# Patient Record
Sex: Male | Born: 1965 | Race: White | Hispanic: No | Marital: Married | Smoking: Never smoker
Health system: Southern US, Community
[De-identification: ages and names within clinical notes are randomized; demographics above are authoritative.]

## PROBLEM LIST (undated history)

## (undated) DIAGNOSIS — E118 Type 2 diabetes mellitus with unspecified complications: Secondary | ICD-10-CM

## (undated) DIAGNOSIS — R011 Cardiac murmur, unspecified: Secondary | ICD-10-CM

## (undated) DIAGNOSIS — E7800 Pure hypercholesterolemia, unspecified: Secondary | ICD-10-CM

## (undated) DIAGNOSIS — Z5189 Encounter for other specified aftercare: Secondary | ICD-10-CM

## (undated) DIAGNOSIS — I2089 Other forms of angina pectoris: Secondary | ICD-10-CM

## (undated) DIAGNOSIS — M545 Low back pain, unspecified: Secondary | ICD-10-CM

## (undated) DIAGNOSIS — K219 Gastro-esophageal reflux disease without esophagitis: Secondary | ICD-10-CM

## (undated) DIAGNOSIS — R569 Unspecified convulsions: Secondary | ICD-10-CM

## (undated) DIAGNOSIS — I428 Other cardiomyopathies: Secondary | ICD-10-CM

## (undated) DIAGNOSIS — I480 Paroxysmal atrial fibrillation: Secondary | ICD-10-CM

## (undated) DIAGNOSIS — F32A Depression, unspecified: Secondary | ICD-10-CM

## (undated) DIAGNOSIS — Z87448 Personal history of other diseases of urinary system: Secondary | ICD-10-CM

## (undated) DIAGNOSIS — IMO0002 Reserved for concepts with insufficient information to code with codable children: Secondary | ICD-10-CM

## (undated) DIAGNOSIS — E039 Hypothyroidism, unspecified: Secondary | ICD-10-CM

## (undated) DIAGNOSIS — Z789 Other specified health status: Secondary | ICD-10-CM

## (undated) DIAGNOSIS — E538 Deficiency of other specified B group vitamins: Secondary | ICD-10-CM

## (undated) DIAGNOSIS — G629 Polyneuropathy, unspecified: Secondary | ICD-10-CM

## (undated) DIAGNOSIS — I208 Other forms of angina pectoris: Secondary | ICD-10-CM

## (undated) DIAGNOSIS — I10 Essential (primary) hypertension: Secondary | ICD-10-CM

## (undated) DIAGNOSIS — G720 Drug-induced myopathy: Secondary | ICD-10-CM

## (undated) DIAGNOSIS — G8929 Other chronic pain: Secondary | ICD-10-CM

## (undated) DIAGNOSIS — G4733 Obstructive sleep apnea (adult) (pediatric): Secondary | ICD-10-CM

## (undated) HISTORY — DX: Cardiac murmur, unspecified: R01.1

## (undated) HISTORY — PX: URETHRAL STRICTURE DILATATION: SHX477

## (undated) HISTORY — DX: Low back pain: M54.5

## (undated) HISTORY — PX: TRANSTHORACIC ECHOCARDIOGRAM: SHX275

## (undated) HISTORY — DX: Deficiency of other specified B group vitamins: E53.8

## (undated) HISTORY — DX: Obstructive sleep apnea (adult) (pediatric): G47.33

## (undated) HISTORY — DX: Other forms of angina pectoris: I20.89

## (undated) HISTORY — DX: Personal history of other diseases of urinary system: Z87.448

## (undated) HISTORY — DX: Paroxysmal atrial fibrillation: I48.0

## (undated) HISTORY — DX: Reserved for concepts with insufficient information to code with codable children: IMO0002

## (undated) HISTORY — DX: Unspecified convulsions: R56.9

## (undated) HISTORY — DX: Polyneuropathy, unspecified: G62.9

## (undated) HISTORY — PX: LUMBAR SPINE SURGERY: SHX701

## (undated) HISTORY — DX: Gastro-esophageal reflux disease without esophagitis: K21.9

## (undated) HISTORY — DX: Other specified health status: Z78.9

## (undated) HISTORY — DX: Low back pain, unspecified: M54.50

## (undated) HISTORY — DX: Type 2 diabetes mellitus with unspecified complications: E11.8

## (undated) HISTORY — DX: Other cardiomyopathies: I42.8

## (undated) HISTORY — DX: Other chronic pain: G89.29

## (undated) HISTORY — DX: Other forms of angina pectoris: I20.8

## (undated) HISTORY — DX: Depression, unspecified: F32.A

## (undated) HISTORY — DX: Hypothyroidism, unspecified: E03.9

## (undated) HISTORY — DX: Encounter for other specified aftercare: Z51.89

## (undated) HISTORY — DX: Essential (primary) hypertension: I10

## (undated) HISTORY — DX: Drug-induced myopathy: G72.0

## (undated) HISTORY — DX: Pure hypercholesterolemia, unspecified: E78.00

---

## 1997-07-25 ENCOUNTER — Ambulatory Visit (HOSPITAL_BASED_OUTPATIENT_CLINIC_OR_DEPARTMENT_OTHER): Admission: RE | Admit: 1997-07-25 | Discharge: 1997-07-25 | Payer: Self-pay | Admitting: *Deleted

## 1999-03-02 ENCOUNTER — Encounter: Payer: Self-pay | Admitting: Family Medicine

## 1999-03-02 ENCOUNTER — Ambulatory Visit (HOSPITAL_COMMUNITY): Admission: RE | Admit: 1999-03-02 | Discharge: 1999-03-02 | Payer: Self-pay | Admitting: Family Medicine

## 1999-03-06 ENCOUNTER — Encounter: Payer: Self-pay | Admitting: Family Medicine

## 1999-03-06 ENCOUNTER — Encounter: Admission: RE | Admit: 1999-03-06 | Discharge: 1999-03-06 | Payer: Self-pay | Admitting: Family Medicine

## 1999-03-16 ENCOUNTER — Encounter: Admission: RE | Admit: 1999-03-16 | Discharge: 1999-04-15 | Payer: Self-pay | Admitting: Neurological Surgery

## 1999-05-01 ENCOUNTER — Encounter: Payer: Self-pay | Admitting: Neurological Surgery

## 1999-05-01 ENCOUNTER — Ambulatory Visit (HOSPITAL_COMMUNITY): Admission: RE | Admit: 1999-05-01 | Discharge: 1999-05-01 | Payer: Self-pay | Admitting: Neurological Surgery

## 1999-05-14 ENCOUNTER — Encounter: Payer: Self-pay | Admitting: Neurological Surgery

## 1999-05-18 ENCOUNTER — Ambulatory Visit (HOSPITAL_COMMUNITY): Admission: RE | Admit: 1999-05-18 | Discharge: 1999-05-19 | Payer: Self-pay | Admitting: Neurological Surgery

## 1999-05-18 ENCOUNTER — Encounter: Payer: Self-pay | Admitting: Neurological Surgery

## 1999-07-26 ENCOUNTER — Encounter: Payer: Self-pay | Admitting: Neurological Surgery

## 1999-07-26 ENCOUNTER — Ambulatory Visit (HOSPITAL_COMMUNITY): Admission: RE | Admit: 1999-07-26 | Discharge: 1999-07-26 | Payer: Self-pay | Admitting: Neurological Surgery

## 1999-09-25 ENCOUNTER — Encounter: Payer: Self-pay | Admitting: Neurological Surgery

## 1999-09-29 ENCOUNTER — Inpatient Hospital Stay (HOSPITAL_COMMUNITY): Admission: RE | Admit: 1999-09-29 | Discharge: 1999-10-05 | Payer: Self-pay | Admitting: Neurological Surgery

## 1999-09-29 ENCOUNTER — Encounter: Payer: Self-pay | Admitting: Neurological Surgery

## 1999-10-07 ENCOUNTER — Emergency Department (HOSPITAL_COMMUNITY): Admission: EM | Admit: 1999-10-07 | Discharge: 1999-10-07 | Payer: Self-pay | Admitting: *Deleted

## 1999-11-09 ENCOUNTER — Emergency Department (HOSPITAL_COMMUNITY): Admission: EM | Admit: 1999-11-09 | Discharge: 1999-11-09 | Payer: Self-pay

## 1999-11-09 ENCOUNTER — Encounter: Payer: Self-pay | Admitting: Emergency Medicine

## 1999-11-12 ENCOUNTER — Ambulatory Visit (HOSPITAL_COMMUNITY): Admission: RE | Admit: 1999-11-12 | Discharge: 1999-11-12 | Payer: Self-pay | Admitting: Family Medicine

## 1999-11-12 ENCOUNTER — Encounter: Payer: Self-pay | Admitting: Family Medicine

## 1999-12-18 ENCOUNTER — Encounter: Payer: Self-pay | Admitting: Neurological Surgery

## 1999-12-18 ENCOUNTER — Encounter: Admission: RE | Admit: 1999-12-18 | Discharge: 1999-12-18 | Payer: Self-pay | Admitting: Neurological Surgery

## 2000-03-07 ENCOUNTER — Encounter: Admission: RE | Admit: 2000-03-07 | Discharge: 2000-05-17 | Payer: Self-pay | Admitting: Neurological Surgery

## 2000-06-01 ENCOUNTER — Ambulatory Visit (HOSPITAL_COMMUNITY): Admission: RE | Admit: 2000-06-01 | Discharge: 2000-06-01 | Payer: Self-pay | Admitting: Neurological Surgery

## 2000-06-01 ENCOUNTER — Encounter: Payer: Self-pay | Admitting: Neurological Surgery

## 2000-10-11 ENCOUNTER — Encounter: Payer: Self-pay | Admitting: Neurological Surgery

## 2000-10-11 ENCOUNTER — Inpatient Hospital Stay (HOSPITAL_COMMUNITY): Admission: RE | Admit: 2000-10-11 | Discharge: 2000-10-15 | Payer: Self-pay | Admitting: Neurological Surgery

## 2000-11-25 ENCOUNTER — Encounter: Admission: RE | Admit: 2000-11-25 | Discharge: 2000-11-25 | Payer: Self-pay | Admitting: Neurological Surgery

## 2000-11-25 ENCOUNTER — Encounter: Payer: Self-pay | Admitting: Neurological Surgery

## 2000-12-30 ENCOUNTER — Encounter: Admission: RE | Admit: 2000-12-30 | Discharge: 2000-12-30 | Payer: Self-pay | Admitting: Neurological Surgery

## 2000-12-30 ENCOUNTER — Encounter: Payer: Self-pay | Admitting: Neurological Surgery

## 2001-08-11 ENCOUNTER — Observation Stay (HOSPITAL_COMMUNITY): Admission: EM | Admit: 2001-08-11 | Discharge: 2001-08-12 | Payer: Self-pay | Admitting: Emergency Medicine

## 2001-08-11 ENCOUNTER — Encounter: Payer: Self-pay | Admitting: Emergency Medicine

## 2001-08-11 ENCOUNTER — Encounter: Payer: Self-pay | Admitting: Internal Medicine

## 2001-11-17 ENCOUNTER — Encounter: Payer: Self-pay | Admitting: Neurological Surgery

## 2001-11-17 ENCOUNTER — Ambulatory Visit (HOSPITAL_COMMUNITY): Admission: RE | Admit: 2001-11-17 | Discharge: 2001-11-17 | Payer: Self-pay | Admitting: Neurological Surgery

## 2002-02-27 ENCOUNTER — Encounter: Payer: Self-pay | Admitting: *Deleted

## 2002-02-27 ENCOUNTER — Ambulatory Visit (HOSPITAL_COMMUNITY): Admission: RE | Admit: 2002-02-27 | Discharge: 2002-02-27 | Payer: Self-pay | Admitting: *Deleted

## 2002-04-29 ENCOUNTER — Inpatient Hospital Stay (HOSPITAL_COMMUNITY): Admission: EM | Admit: 2002-04-29 | Discharge: 2002-05-02 | Payer: Self-pay | Admitting: Emergency Medicine

## 2002-04-29 ENCOUNTER — Encounter: Payer: Self-pay | Admitting: Emergency Medicine

## 2002-04-30 ENCOUNTER — Encounter: Payer: Self-pay | Admitting: Internal Medicine

## 2002-05-16 ENCOUNTER — Ambulatory Visit (HOSPITAL_COMMUNITY): Admission: RE | Admit: 2002-05-16 | Discharge: 2002-05-16 | Payer: Self-pay | Admitting: Family Medicine

## 2002-05-16 ENCOUNTER — Encounter: Payer: Self-pay | Admitting: Family Medicine

## 2002-05-23 ENCOUNTER — Ambulatory Visit (HOSPITAL_BASED_OUTPATIENT_CLINIC_OR_DEPARTMENT_OTHER): Admission: RE | Admit: 2002-05-23 | Discharge: 2002-05-23 | Payer: Self-pay | Admitting: *Deleted

## 2003-03-12 ENCOUNTER — Ambulatory Visit (HOSPITAL_COMMUNITY): Admission: RE | Admit: 2003-03-12 | Discharge: 2003-03-12 | Payer: Self-pay | Admitting: Urology

## 2003-03-12 ENCOUNTER — Encounter (INDEPENDENT_AMBULATORY_CARE_PROVIDER_SITE_OTHER): Payer: Self-pay

## 2004-12-23 ENCOUNTER — Inpatient Hospital Stay (HOSPITAL_COMMUNITY): Admission: EM | Admit: 2004-12-23 | Discharge: 2004-12-25 | Payer: Self-pay | Admitting: Emergency Medicine

## 2004-12-23 ENCOUNTER — Encounter: Payer: Self-pay | Admitting: Cardiology

## 2004-12-23 ENCOUNTER — Ambulatory Visit: Payer: Self-pay | Admitting: Cardiology

## 2004-12-24 ENCOUNTER — Ambulatory Visit: Payer: Self-pay | Admitting: Hospitalist

## 2004-12-30 ENCOUNTER — Ambulatory Visit: Payer: Self-pay | Admitting: Internal Medicine

## 2005-02-12 ENCOUNTER — Ambulatory Visit: Payer: Self-pay | Admitting: Internal Medicine

## 2005-03-11 ENCOUNTER — Ambulatory Visit (HOSPITAL_COMMUNITY): Admission: RE | Admit: 2005-03-11 | Discharge: 2005-03-11 | Payer: Self-pay | Admitting: Internal Medicine

## 2005-03-16 ENCOUNTER — Ambulatory Visit: Payer: Self-pay | Admitting: Internal Medicine

## 2005-03-16 ENCOUNTER — Ambulatory Visit (HOSPITAL_BASED_OUTPATIENT_CLINIC_OR_DEPARTMENT_OTHER): Admission: RE | Admit: 2005-03-16 | Discharge: 2005-03-16 | Payer: Self-pay | Admitting: Internal Medicine

## 2005-03-21 ENCOUNTER — Ambulatory Visit: Payer: Self-pay | Admitting: Internal Medicine

## 2005-03-31 ENCOUNTER — Ambulatory Visit: Payer: Self-pay | Admitting: Internal Medicine

## 2005-05-14 ENCOUNTER — Ambulatory Visit: Payer: Self-pay | Admitting: Internal Medicine

## 2005-05-28 ENCOUNTER — Ambulatory Visit: Payer: Self-pay | Admitting: Internal Medicine

## 2005-08-09 ENCOUNTER — Ambulatory Visit: Payer: Self-pay | Admitting: Internal Medicine

## 2005-08-09 ENCOUNTER — Ambulatory Visit (HOSPITAL_COMMUNITY): Admission: RE | Admit: 2005-08-09 | Discharge: 2005-08-09 | Payer: Self-pay | Admitting: Internal Medicine

## 2005-08-16 ENCOUNTER — Ambulatory Visit: Payer: Self-pay | Admitting: Internal Medicine

## 2005-08-27 ENCOUNTER — Ambulatory Visit: Payer: Self-pay | Admitting: Internal Medicine

## 2006-01-07 ENCOUNTER — Ambulatory Visit: Payer: Self-pay | Admitting: Internal Medicine

## 2006-01-07 ENCOUNTER — Observation Stay (HOSPITAL_COMMUNITY): Admission: AD | Admit: 2006-01-07 | Discharge: 2006-01-08 | Payer: Self-pay | Admitting: Internal Medicine

## 2006-01-07 ENCOUNTER — Ambulatory Visit: Payer: Self-pay | Admitting: Cardiology

## 2006-01-07 ENCOUNTER — Encounter: Payer: Self-pay | Admitting: Cardiology

## 2006-02-10 ENCOUNTER — Ambulatory Visit: Payer: Self-pay | Admitting: Internal Medicine

## 2006-03-17 ENCOUNTER — Ambulatory Visit: Payer: Self-pay | Admitting: Cardiovascular Disease

## 2006-03-22 ENCOUNTER — Ambulatory Visit: Payer: Self-pay

## 2006-03-25 ENCOUNTER — Ambulatory Visit: Payer: Self-pay

## 2006-04-19 ENCOUNTER — Ambulatory Visit: Payer: Self-pay | Admitting: Cardiovascular Disease

## 2006-05-07 ENCOUNTER — Inpatient Hospital Stay (HOSPITAL_COMMUNITY): Admission: EM | Admit: 2006-05-07 | Discharge: 2006-05-17 | Payer: Self-pay | Admitting: *Deleted

## 2006-05-07 ENCOUNTER — Ambulatory Visit: Payer: Self-pay | Admitting: Pulmonary Disease

## 2006-05-07 ENCOUNTER — Ambulatory Visit: Payer: Self-pay | Admitting: Internal Medicine

## 2006-05-17 DIAGNOSIS — D518 Other vitamin B12 deficiency anemias: Secondary | ICD-10-CM

## 2006-05-18 ENCOUNTER — Telehealth: Payer: Self-pay | Admitting: *Deleted

## 2006-05-19 ENCOUNTER — Telehealth: Payer: Self-pay | Admitting: *Deleted

## 2006-05-23 ENCOUNTER — Telehealth: Payer: Self-pay | Admitting: *Deleted

## 2006-05-27 ENCOUNTER — Ambulatory Visit: Payer: Self-pay | Admitting: Internal Medicine

## 2006-05-27 ENCOUNTER — Telehealth (INDEPENDENT_AMBULATORY_CARE_PROVIDER_SITE_OTHER): Payer: Self-pay | Admitting: Internal Medicine

## 2006-06-03 ENCOUNTER — Ambulatory Visit: Payer: Self-pay | Admitting: Internal Medicine

## 2006-06-03 ENCOUNTER — Encounter (INDEPENDENT_AMBULATORY_CARE_PROVIDER_SITE_OTHER): Payer: Self-pay | Admitting: Internal Medicine

## 2006-06-14 ENCOUNTER — Encounter (INDEPENDENT_AMBULATORY_CARE_PROVIDER_SITE_OTHER): Payer: Self-pay | Admitting: Internal Medicine

## 2006-06-14 ENCOUNTER — Ambulatory Visit: Payer: Self-pay | Admitting: Internal Medicine

## 2006-06-14 DIAGNOSIS — I209 Angina pectoris, unspecified: Secondary | ICD-10-CM

## 2006-06-14 DIAGNOSIS — E119 Type 2 diabetes mellitus without complications: Secondary | ICD-10-CM

## 2006-06-14 DIAGNOSIS — K219 Gastro-esophageal reflux disease without esophagitis: Secondary | ICD-10-CM | POA: Insufficient documentation

## 2006-06-14 DIAGNOSIS — G609 Hereditary and idiopathic neuropathy, unspecified: Secondary | ICD-10-CM | POA: Insufficient documentation

## 2006-06-14 DIAGNOSIS — J4489 Other specified chronic obstructive pulmonary disease: Secondary | ICD-10-CM | POA: Insufficient documentation

## 2006-06-14 DIAGNOSIS — I10 Essential (primary) hypertension: Secondary | ICD-10-CM | POA: Insufficient documentation

## 2006-06-14 DIAGNOSIS — F17200 Nicotine dependence, unspecified, uncomplicated: Secondary | ICD-10-CM

## 2006-06-14 DIAGNOSIS — J449 Chronic obstructive pulmonary disease, unspecified: Secondary | ICD-10-CM | POA: Insufficient documentation

## 2006-06-14 DIAGNOSIS — M545 Low back pain: Secondary | ICD-10-CM

## 2006-06-14 LAB — CONVERTED CEMR LAB
BUN: 15 mg/dL (ref 6–23)
CO2: 23 meq/L (ref 19–32)
Calcium: 9.3 mg/dL (ref 8.4–10.5)
Chloride: 99 meq/L (ref 96–112)
Glucose, Bld: 142 mg/dL — ABNORMAL HIGH (ref 70–99)
Hgb A1c MFr Bld: 6.8 %
Potassium: 4.6 meq/L (ref 3.5–5.3)
Sodium: 136 meq/L (ref 135–145)

## 2006-06-29 ENCOUNTER — Telehealth: Payer: Self-pay | Admitting: *Deleted

## 2006-08-31 ENCOUNTER — Encounter (INDEPENDENT_AMBULATORY_CARE_PROVIDER_SITE_OTHER): Payer: Self-pay | Admitting: Internal Medicine

## 2006-09-02 ENCOUNTER — Telehealth: Payer: Self-pay | Admitting: *Deleted

## 2006-09-07 ENCOUNTER — Telehealth: Payer: Self-pay | Admitting: *Deleted

## 2006-09-14 ENCOUNTER — Ambulatory Visit: Payer: Self-pay | Admitting: Internal Medicine

## 2006-09-14 DIAGNOSIS — G4733 Obstructive sleep apnea (adult) (pediatric): Secondary | ICD-10-CM | POA: Insufficient documentation

## 2006-09-15 ENCOUNTER — Encounter (INDEPENDENT_AMBULATORY_CARE_PROVIDER_SITE_OTHER): Payer: Self-pay | Admitting: Internal Medicine

## 2006-09-15 ENCOUNTER — Ambulatory Visit: Payer: Self-pay | Admitting: Internal Medicine

## 2006-09-15 ENCOUNTER — Ambulatory Visit: Payer: Self-pay | Admitting: Cardiovascular Disease

## 2006-09-15 LAB — CONVERTED CEMR LAB
BUN: 15 mg/dL (ref 6–23)
CO2: 27 meq/L (ref 19–32)
Calcium: 9 mg/dL (ref 8.4–10.5)
Creatinine, Ser: 1.03 mg/dL (ref 0.40–1.50)
Glucose, Bld: 184 mg/dL — ABNORMAL HIGH (ref 70–99)
Potassium: 4.2 meq/L (ref 3.5–5.3)

## 2006-10-27 ENCOUNTER — Ambulatory Visit: Payer: Self-pay | Admitting: *Deleted

## 2006-10-27 DIAGNOSIS — R609 Edema, unspecified: Secondary | ICD-10-CM

## 2006-10-27 DIAGNOSIS — R1310 Dysphagia, unspecified: Secondary | ICD-10-CM | POA: Insufficient documentation

## 2006-10-28 ENCOUNTER — Encounter (INDEPENDENT_AMBULATORY_CARE_PROVIDER_SITE_OTHER): Payer: Self-pay | Admitting: *Deleted

## 2006-10-28 LAB — CONVERTED CEMR LAB
BUN: 16 mg/dL (ref 6–23)
CO2: 28 meq/L (ref 19–32)
Creatinine, Ser: 1.09 mg/dL (ref 0.40–1.50)
Glucose, Bld: 130 mg/dL — ABNORMAL HIGH (ref 70–99)
Microalb, Ur: 2.41 mg/dL — ABNORMAL HIGH (ref 0.00–1.89)
Sodium: 142 meq/L (ref 135–145)
TSH: 2.525 microintl units/mL (ref 0.350–5.50)

## 2006-10-31 ENCOUNTER — Encounter (INDEPENDENT_AMBULATORY_CARE_PROVIDER_SITE_OTHER): Payer: Self-pay | Admitting: Internal Medicine

## 2006-11-02 ENCOUNTER — Ambulatory Visit (HOSPITAL_COMMUNITY): Admission: RE | Admit: 2006-11-02 | Discharge: 2006-11-02 | Payer: Self-pay | Admitting: *Deleted

## 2006-11-04 ENCOUNTER — Encounter (INDEPENDENT_AMBULATORY_CARE_PROVIDER_SITE_OTHER): Payer: Self-pay | Admitting: Internal Medicine

## 2006-11-29 ENCOUNTER — Encounter (INDEPENDENT_AMBULATORY_CARE_PROVIDER_SITE_OTHER): Payer: Self-pay | Admitting: Internal Medicine

## 2006-11-29 ENCOUNTER — Ambulatory Visit: Payer: Self-pay | Admitting: Internal Medicine

## 2006-11-29 LAB — CONVERTED CEMR LAB
Blood Glucose, Fingerstick: 157
Glucose, Urine, Semiquant: NEGATIVE
Ketones, ur: NEGATIVE mg/dL
Ketones, urine, test strip: NEGATIVE
Leukocytes, UA: NEGATIVE
Nitrite: NEGATIVE
Protein, ur: NEGATIVE mg/dL
Urobilinogen, UA: 0.2
pH: 5.5 (ref 5.0–8.0)

## 2006-12-28 ENCOUNTER — Encounter (INDEPENDENT_AMBULATORY_CARE_PROVIDER_SITE_OTHER): Payer: Self-pay | Admitting: Internal Medicine

## 2007-01-03 ENCOUNTER — Ambulatory Visit: Payer: Self-pay | Admitting: Hospitalist

## 2007-01-03 ENCOUNTER — Encounter (INDEPENDENT_AMBULATORY_CARE_PROVIDER_SITE_OTHER): Payer: Self-pay | Admitting: Internal Medicine

## 2007-01-03 DIAGNOSIS — L538 Other specified erythematous conditions: Secondary | ICD-10-CM | POA: Insufficient documentation

## 2007-01-03 DIAGNOSIS — R5383 Other fatigue: Secondary | ICD-10-CM

## 2007-01-03 DIAGNOSIS — R5381 Other malaise: Secondary | ICD-10-CM | POA: Insufficient documentation

## 2007-01-03 LAB — CONVERTED CEMR LAB: Hgb A1c MFr Bld: 7.3 %

## 2007-01-05 LAB — CONVERTED CEMR LAB
ALT: 36 units/L (ref 0–53)
AST: 26 units/L (ref 0–37)
Alkaline Phosphatase: 63 units/L (ref 39–117)
Chloride: 100 meq/L (ref 96–112)
Creatinine, Ser: 0.91 mg/dL (ref 0.40–1.50)
Eosinophils Absolute: 0.4 10*3/uL (ref 0.0–0.7)
Eosinophils Relative: 3 % (ref 0–5)
Hemoglobin: 14.3 g/dL (ref 13.0–17.0)
Lymphocytes Relative: 26 % (ref 12–46)
MCV: 86.3 fL (ref 78.0–100.0)
Monocytes Absolute: 1.2 10*3/uL — ABNORMAL HIGH (ref 0.2–0.7)
Monocytes Relative: 10 % (ref 3–11)
Neutro Abs: 7.4 10*3/uL (ref 1.7–7.7)
Neutrophils Relative %: 61 % (ref 43–77)
Platelets: 358 10*3/uL (ref 150–400)
RBC: 5.26 M/uL (ref 4.22–5.81)
RDW: 15.3 % — ABNORMAL HIGH (ref 11.5–14.0)
Sodium: 139 meq/L (ref 135–145)
Total Bilirubin: 0.6 mg/dL (ref 0.3–1.2)
Vitamin B-12: >2000 pg/mL — ABNORMAL HIGH (ref 211–911)

## 2007-04-10 ENCOUNTER — Telehealth (INDEPENDENT_AMBULATORY_CARE_PROVIDER_SITE_OTHER): Payer: Self-pay | Admitting: Internal Medicine

## 2007-05-12 ENCOUNTER — Telehealth (INDEPENDENT_AMBULATORY_CARE_PROVIDER_SITE_OTHER): Payer: Self-pay | Admitting: Internal Medicine

## 2007-05-19 ENCOUNTER — Encounter (INDEPENDENT_AMBULATORY_CARE_PROVIDER_SITE_OTHER): Payer: Self-pay | Admitting: Internal Medicine

## 2007-05-30 ENCOUNTER — Telehealth: Payer: Self-pay | Admitting: *Deleted

## 2007-06-06 ENCOUNTER — Telehealth (INDEPENDENT_AMBULATORY_CARE_PROVIDER_SITE_OTHER): Payer: Self-pay | Admitting: Internal Medicine

## 2007-06-08 ENCOUNTER — Telehealth (INDEPENDENT_AMBULATORY_CARE_PROVIDER_SITE_OTHER): Payer: Self-pay | Admitting: Internal Medicine

## 2007-06-30 ENCOUNTER — Encounter (INDEPENDENT_AMBULATORY_CARE_PROVIDER_SITE_OTHER): Payer: Self-pay | Admitting: Internal Medicine

## 2007-06-30 ENCOUNTER — Ambulatory Visit: Payer: Self-pay | Admitting: Hospitalist

## 2007-07-04 DIAGNOSIS — E559 Vitamin D deficiency, unspecified: Secondary | ICD-10-CM | POA: Insufficient documentation

## 2007-07-04 LAB — CONVERTED CEMR LAB
ALT: 31 units/L (ref 0–53)
AST: 24 units/L (ref 0–37)
Alkaline Phosphatase: 72 units/L (ref 39–117)
Basophils Absolute: 0 10*3/uL (ref 0.0–0.1)
Basophils Relative: 0 % (ref 0–1)
Calcium: 9.3 mg/dL (ref 8.4–10.5)
Eosinophils Absolute: 0.2 10*3/uL (ref 0.0–0.7)
Glucose, Bld: 315 mg/dL — ABNORMAL HIGH (ref 70–99)
MCV: 88.3 fL (ref 78.0–100.0)
Neutro Abs: 7.1 10*3/uL (ref 1.7–7.7)
Neutrophils Relative %: 64 % (ref 43–77)
Platelets: 398 10*3/uL (ref 150–400)
RBC: 5.37 M/uL (ref 4.22–5.81)
RDW: 13.3 % (ref 11.5–15.5)
Sodium: 136 meq/L (ref 135–145)
Total Bilirubin: 0.3 mg/dL (ref 0.3–1.2)
Vit D, 1,25-Dihydroxy: 14 — ABNORMAL LOW (ref 30–89)

## 2007-10-31 ENCOUNTER — Telehealth (INDEPENDENT_AMBULATORY_CARE_PROVIDER_SITE_OTHER): Payer: Self-pay | Admitting: Internal Medicine

## 2008-02-08 ENCOUNTER — Encounter (INDEPENDENT_AMBULATORY_CARE_PROVIDER_SITE_OTHER): Payer: Self-pay | Admitting: Internal Medicine

## 2008-02-08 ENCOUNTER — Ambulatory Visit: Payer: Self-pay | Admitting: Infectious Diseases

## 2008-02-09 DIAGNOSIS — D72829 Elevated white blood cell count, unspecified: Secondary | ICD-10-CM | POA: Insufficient documentation

## 2008-02-09 LAB — CONVERTED CEMR LAB
ALT: 27 units/L (ref 0–53)
Albumin: 4.2 g/dL (ref 3.5–5.2)
Basophils Absolute: 0 10*3/uL (ref 0.0–0.1)
Basophils Relative: 0 % (ref 0–1)
Cholesterol: 163 mg/dL (ref 0–200)
Creatinine, Ser: 1.04 mg/dL (ref 0.40–1.50)
Eosinophils Absolute: 0.3 10*3/uL (ref 0.0–0.7)
Glucose, Bld: 157 mg/dL — ABNORMAL HIGH (ref 70–99)
MCHC: 31.7 g/dL (ref 30.0–36.0)
MCV: 84 fL (ref 78.0–100.0)
Microalb Creat Ratio: 18.5 mg/g (ref 0.0–30.0)
Microalb, Ur: 2.3 mg/dL — ABNORMAL HIGH (ref 0.00–1.89)
Neutrophils Relative %: 67 % (ref 43–77)
Platelets: 363 10*3/uL (ref 150–400)
Potassium: 4.6 meq/L (ref 3.5–5.3)
RBC: 5.3 M/uL (ref 4.22–5.81)
RDW: 14.1 % (ref 11.5–15.5)
Total Protein: 8.1 g/dL (ref 6.0–8.3)
Triglycerides: 151 mg/dL — ABNORMAL HIGH (ref <150)
WBC: 15.1 10*3/uL — ABNORMAL HIGH (ref 4.0–10.5)

## 2008-09-27 ENCOUNTER — Ambulatory Visit: Payer: Self-pay | Admitting: Internal Medicine

## 2008-09-29 ENCOUNTER — Encounter: Payer: Self-pay | Admitting: Internal Medicine

## 2008-09-29 ENCOUNTER — Telehealth: Payer: Self-pay | Admitting: Internal Medicine

## 2008-09-29 DIAGNOSIS — E875 Hyperkalemia: Secondary | ICD-10-CM | POA: Insufficient documentation

## 2008-10-11 ENCOUNTER — Ambulatory Visit: Payer: Self-pay | Admitting: Internal Medicine

## 2008-10-12 ENCOUNTER — Telehealth: Payer: Self-pay | Admitting: Internal Medicine

## 2008-11-04 ENCOUNTER — Encounter: Payer: Self-pay | Admitting: Internal Medicine

## 2008-11-04 LAB — HM DIABETES EYE EXAM: HM Diabetic Eye Exam: NORMAL

## 2008-11-28 LAB — CONVERTED CEMR LAB
Alkaline Phosphatase: 52 units/L (ref 39–117)
BUN: 15 mg/dL (ref 6–23)
CO2: 26 meq/L (ref 19–32)
Chloride: 101 meq/L (ref 96–112)
Cholesterol: 141 mg/dL (ref 0–200)
Creatinine, Ser: 0.89 mg/dL (ref 0.40–1.50)
Creatinine, Ser: 0.95 mg/dL (ref 0.40–1.50)
Glucose, Bld: 134 mg/dL — ABNORMAL HIGH (ref 70–99)
Glucose, Bld: 149 mg/dL — ABNORMAL HIGH (ref 70–99)
HDL: 37 mg/dL — ABNORMAL LOW (ref >39)
Hgb A1c MFr Bld: 6.4 % — ABNORMAL HIGH (ref 4.6–6.1)
Indirect Bilirubin: 0.2 mg/dL (ref 0.0–0.9)
LDL Cholesterol: 90 mg/dL (ref 0–99)
Microalb, Ur: 2.55 mg/dL — ABNORMAL HIGH (ref 0.00–1.89)
Potassium: 4.1 meq/L (ref 3.5–5.3)
Potassium: 5.4 meq/L — ABNORMAL HIGH (ref 3.5–5.3)
TSH: 0.738 microintl units/mL (ref 0.350–4.500)
Total Bilirubin: 0.3 mg/dL (ref 0.3–1.2)
Total CHOL/HDL Ratio: 3.8
Total Protein: 7.8 g/dL (ref 6.0–8.3)
Triglycerides: 68 mg/dL (ref <150)
VLDL: 14 mg/dL (ref 0–40)

## 2008-12-26 ENCOUNTER — Ambulatory Visit: Payer: Self-pay | Admitting: Internal Medicine

## 2008-12-26 LAB — CONVERTED CEMR LAB
BUN: 15 mg/dL (ref 6–23)
CO2: 30 meq/L (ref 19–32)
Chloride: 102 meq/L (ref 96–112)
Creatinine, Ser: 0.91 mg/dL (ref 0.40–1.50)
Glucose, Bld: 90 mg/dL (ref 70–99)
Potassium: 4.7 meq/L (ref 3.5–5.3)

## 2009-01-02 ENCOUNTER — Ambulatory Visit: Payer: Self-pay | Admitting: Internal Medicine

## 2009-01-07 ENCOUNTER — Telehealth: Payer: Self-pay | Admitting: Internal Medicine

## 2009-02-06 ENCOUNTER — Encounter: Payer: Self-pay | Admitting: Internal Medicine

## 2009-02-19 ENCOUNTER — Encounter: Payer: Self-pay | Admitting: Internal Medicine

## 2009-03-03 ENCOUNTER — Encounter: Payer: Self-pay | Admitting: Internal Medicine

## 2009-03-21 ENCOUNTER — Telehealth: Payer: Self-pay | Admitting: Internal Medicine

## 2009-05-15 ENCOUNTER — Telehealth: Payer: Self-pay | Admitting: Internal Medicine

## 2009-05-29 ENCOUNTER — Ambulatory Visit: Payer: Self-pay | Admitting: Internal Medicine

## 2009-05-29 LAB — CONVERTED CEMR LAB
Chloride: 104 meq/L (ref 96–112)
Potassium: 5 meq/L (ref 3.5–5.3)

## 2009-06-26 ENCOUNTER — Ambulatory Visit: Payer: Self-pay | Admitting: Internal Medicine

## 2009-06-26 DIAGNOSIS — F329 Major depressive disorder, single episode, unspecified: Secondary | ICD-10-CM | POA: Insufficient documentation

## 2009-06-26 LAB — HM DIABETES FOOT EXAM

## 2009-07-01 ENCOUNTER — Telehealth (INDEPENDENT_AMBULATORY_CARE_PROVIDER_SITE_OTHER): Payer: Self-pay | Admitting: *Deleted

## 2009-08-25 ENCOUNTER — Encounter: Payer: Self-pay | Admitting: Internal Medicine

## 2009-11-05 ENCOUNTER — Encounter: Payer: Self-pay | Admitting: Internal Medicine

## 2009-11-05 ENCOUNTER — Ambulatory Visit: Payer: Self-pay | Admitting: Family

## 2009-11-05 LAB — CONVERTED CEMR LAB: Folate: >20 ng/mL

## 2009-11-13 ENCOUNTER — Telehealth: Payer: Self-pay | Admitting: Family

## 2009-12-05 ENCOUNTER — Ambulatory Visit: Payer: Self-pay | Admitting: Diagnostic Radiology

## 2009-12-05 ENCOUNTER — Ambulatory Visit: Payer: Self-pay | Admitting: Family

## 2009-12-05 ENCOUNTER — Ambulatory Visit (HOSPITAL_BASED_OUTPATIENT_CLINIC_OR_DEPARTMENT_OTHER): Admission: RE | Admit: 2009-12-05 | Discharge: 2009-12-05 | Payer: Self-pay | Admitting: Internal Medicine

## 2009-12-05 DIAGNOSIS — J329 Chronic sinusitis, unspecified: Secondary | ICD-10-CM | POA: Insufficient documentation

## 2009-12-05 DIAGNOSIS — M25539 Pain in unspecified wrist: Secondary | ICD-10-CM

## 2009-12-05 DIAGNOSIS — M25529 Pain in unspecified elbow: Secondary | ICD-10-CM | POA: Insufficient documentation

## 2009-12-05 LAB — CONVERTED CEMR LAB: Uric Acid, Serum: 6 mg/dL (ref 4.0–7.8)

## 2009-12-19 ENCOUNTER — Encounter: Payer: Self-pay | Admitting: Internal Medicine

## 2009-12-22 ENCOUNTER — Telehealth: Payer: Self-pay | Admitting: Internal Medicine

## 2009-12-24 ENCOUNTER — Encounter: Payer: Self-pay | Admitting: Internal Medicine

## 2009-12-31 ENCOUNTER — Encounter: Payer: Self-pay | Admitting: Internal Medicine

## 2010-01-14 ENCOUNTER — Telehealth: Payer: Self-pay | Admitting: Family

## 2010-01-28 ENCOUNTER — Ambulatory Visit: Payer: Self-pay | Admitting: Internal Medicine

## 2010-01-28 ENCOUNTER — Telehealth: Payer: Self-pay | Admitting: Internal Medicine

## 2010-01-28 DIAGNOSIS — J309 Allergic rhinitis, unspecified: Secondary | ICD-10-CM | POA: Insufficient documentation

## 2010-01-28 LAB — CONVERTED CEMR LAB
Calcium: 9.3 mg/dL (ref 8.4–10.5)
Creatinine, Ser: 0.83 mg/dL (ref 0.40–1.50)
Hgb A1c MFr Bld: 6.7 % — ABNORMAL HIGH (ref <5.7)
Sodium: 139 meq/L (ref 135–145)

## 2010-01-29 ENCOUNTER — Encounter: Payer: Self-pay | Admitting: Internal Medicine

## 2010-02-03 ENCOUNTER — Encounter: Payer: Self-pay | Admitting: Internal Medicine

## 2010-03-04 ENCOUNTER — Telehealth: Payer: Self-pay | Admitting: Internal Medicine

## 2010-03-12 ENCOUNTER — Encounter: Payer: Self-pay | Admitting: Internal Medicine

## 2010-05-05 NOTE — Progress Notes (Signed)
Summary: [facility] order form  Phone Note Other Incoming   Caller: Misty @ [facility]  [phone] Summary of Call: Received call from [facility] wanting to know if we received their diabetic supply form?  Reference # [id number].  Nicki Guadalajara Fergerson CMA [name])  March 04, 2010 10:18 AM   Follow-up for Phone Call        call returned to [facility] at [phone] regarding forms, spoke with CSR Lurena Joiner, she was informed Diabetic form has not been received. Lurena Joiner stated a form would be faxed with the next 30 minutes. She inquired about the turnaround time, she was informed 24-48 hours.  Follow-up by: Glendell Docker CMA,  March 04, 2010 1:35 PM  Additional Follow-up for Phone Call Additional follow up Details #1::        unaware if form has been recieved, no return call from [facility] or patient regarding status Additional Follow-up by: Glendell Docker CMA,  March 06, 2010 11:42 AM

## 2010-05-05 NOTE — Assessment & Plan Note (Signed)
Summary: bp high /mhf--Rm 5   Vital Signs:  Patient profile:   45 year old male Height:      73 inches Weight:      328 pounds BMI:     43.43 Temp:     98.4 degrees F oral Pulse rate:   84 / minute Pulse rhythm:   regular Resp:     18 per minute BP sitting:   150 / 86  (right arm) Cuff size:   large  Vitals Entered By: Mervin Kung CMA [name]) (November 05, 2009 1:52 PM) CC: Room 5   Pt past due for follow up of diabetes and HTN. Needs refills on: cyanocobalamin, Bystolic, Metformin and Glipizide. Is Patient Diabetic? Yes Comments Pt states he can't afford the spiriva and advair and doesn't use it.  States that he had increased his Bystolic to 1 two times a day because 1 once daily wasn't  controlling his BP. Was checking BP at drug store. Nicki Guadalajara Fergerson CMA [name])  November 05, 2009 2:00 PM    Primary Care Provider:  [name]  CC:  Room 5   Pt past due for follow up of diabetes and HTN. Needs refills on: cyanocobalamin, Bystolic, and Metformin and Glipizide.Marland Kitchen  History of Present Illness: Dylan Owen is a 45 year old male who presents today for follow up.  Notes that he has run out of his BP medications x 3 weeks.  Notes that he has continued his glipizide/metformin. Patient is a very poor historian.  1)BP- has not had his BP meds x 3 weeks.  2)COPD- now on HS 02.  Reports that he was also diagnosed with OSA but could not tolerate CPAP. Notes that he cannot afford his Advair or his spiriva.  3)DM2- Notes that he has been checking his CBG's in the AM.  He has been taking glipizide 1 tab in the AM and full tab at night.  Found that when he cut the PM dose in half, he had sugars over 200.  Notes that the lowest reading he has seen is 76.  Denies symptomatic hypoglycemia.    Allergies: 1)  Prednisone  Physical Exam  General:  Well-developed,well-nourished,in no acute distress; alert,appropriate and cooperative throughout examination Head:  Normocephalic and atraumatic  without obvious abnormalities. No apparent alopecia or balding. Lungs:  Normal respiratory effort, chest expands symmetrically. Lungs are clear to auscultation, no crackles or wheezes. Heart:  Normal rate and regular rhythm. S1 and S2 normal without gallop, murmur, click, rub or other extra sounds. Extremities:  1+ left pedal edema and 1+ right pedal edema.   Psych:  Very talkative, but pleasant.   Impression & Recommendations:  Problem # 1:  DIABETES MELLITUS, TYPE II (ICD-250.00) Assessment Improved No significant hypoglycemia, per pt sugars rose over 200 when he cut back HS dose of glipizide.  Will plan to continue current dose of metformin and 10mg  glipizide two times a day. Will check A1C  His updated medication list for this problem includes:    Metformin Hcl 1000 Mg Tabs (Metformin hcl) .Marland Kitchen... Take 1 tablet by mouth two times a day    Glipizide 10 Mg Tabs (Glipizide) ..... One by mouth in am and 1/2 to one tab in pm  Orders: T-Hgb A1C (04540-98119)  Problem # 2:  HYPERTENSION (ICD-401.9) Assessment: Deteriorated Ran out of bystolic, bp is up today.  1 month supply of samples provided to patient.  Patient instructed to resume.  F/u in 1 month  with Dr. Artist Pais. His updated medication list for this problem includes:    Bystolic 5 Mg Tabs (Nebivolol hcl) ..... One by mouth once daily  Problem # 3:  ANEMIA, VITAMIN B12 DEFICIENCY NEC (ICD-281.1) Assessment: Comment Only He has been getting rx from "old physician at [facility]".  Wife has been injecting and she tells me that his last dose was 2 weeks ago.  He is requesting refill.  Will have patient f/u in 2 weeks for nurse visit for injection.  Will check B12 level and will defer to Dr. Artist Pais if he wishes to have patient resume home injections vs. nurse visits going forward.  His updated medication list for this problem includes:    Cyanocobalamin 1000 Mcg/ml Soln (Cyanocobalamin) ..... Inject im once per month  Orders: T-Vitamin B12  ([id number]) T-Folate (95621)  Problem # 4:  COPD (ICD-496) Assessment: Unchanged Pt was given 1 month supply of spiriva samples and 2 week supply of advair discus samples.  It is not clear to me who is managing his OSA studies.  Will discuss with Dr. Artist Pais.   His updated medication list for this problem includes:    Spiriva Handihaler 18 Mcg Caps (Tiotropium bromide monohydrate) ..... Inhale 1 capsule every morning    Albuterol 90 Mcg/act Aers (Albuterol) ..... Inhale 2 puffs every 6 hours as needed for shortness of breath    Advair Diskus 250-50 Mcg/dose Misc (Fluticasone-salmeterol) ..... Inhale 1 puff two times a day  Complete Medication List: 1)  Cyanocobalamin 1000 Mcg/ml Soln (Cyanocobalamin) .... Inject im once per month 2)  Multivitamins Tabs (Multiple vitamin) .... Take 1 tablet by mouth once a day 3)  Spiriva Handihaler 18 Mcg Caps (Tiotropium bromide monohydrate) .... Inhale 1 capsule every morning 4)  Valium 5 Mg Tabs (Diazepam) .... Take 1 tablet by mouth every 6 hours as needed for anxiety and muscle spasms 5)  Zantac 150 Mg Tabs (Ranitidine hcl) .... Take 1 tablet by mouth once a day 6)  Albuterol 90 Mcg/act Aers (Albuterol) .... Inhale 2 puffs every 6 hours as needed for shortness of breath 7)  Nitroglycerin 0.4 Mg Subl (Nitroglycerin) .... Take 1 tablet under your tongue every 5 minutes, up to three times, as needed for chest pain 8)  Bystolic 5 Mg Tabs (Nebivolol hcl) .... One by mouth once daily 9)  Ms Contin 30 Mg Xr12h-tab (Morphine sulfate) .... Take one tab by mouth once every 8 hours 10)  Advair Diskus 250-50 Mcg/dose Misc (Fluticasone-salmeterol) .... Inhale 1 puff two times a day 11)  Onetouch Ultra Test Strp (Glucose blood) .... Please check your sugar before breakfast, before you go to sleep, and if you develop symptoms of lethargy or confusion. 12)  Metformin Hcl 1000 Mg Tabs (Metformin hcl) .... Take 1 tablet by mouth two times a day 13)  Glipizide 10 Mg Tabs  (Glipizide) .... One by mouth in am and 1/2 to one tab in pm 14)  Cymbalta 60 Mg Cpep (Duloxetine hcl) .... Once daily 15)  Dialyvite Vitamin D 5000 5000 Unit Caps (Cholecalciferol) .... Take 1 capsule by mouth once a day  Patient Instructions: 1)  Resume Bystolic. 2)  Complete labs downstairs. 3)  Prescriptions have been sent to your pharmacy. 4)  Follow up in 1 month with Dr. Artist Pais to follow up on your blood pressure.  Prescriptions: GLIPIZIDE 10 MG TABS (GLIPIZIDE) one by mouth in AM and 1/2 to one tab in PM  #60 x 2   Entered and Authorized  by:   Lemont Fillers FNP   Signed by:   Lemont Fillers FNP on 11/05/2009   Method used:   [facility]  [place] [address]       [address]       [place], [place]  [address]       Ph: [phone] or [phone]       Fax: [phone]   RxID:   [id number] METFORMIN HCL 1000 MG TABS (METFORMIN HCL) Take 1 tablet by mouth two times a day  #60 Tablet x 2   Entered and Authorized by:   Lemont Fillers FNP   Signed by:   Lemont Fillers FNP on 11/05/2009   Method used:   [facility]        [facility]  [place] [address]ail[facility]       [address]       [place], [place]  [address]       Ph: [phone] or [phone]       Fax: [phone]   RxID:   [id number] ADVAIR DISKUS 250-50 MCG/DOSE MISC (FLUTICASONE-SALMETEROL) Inhale 1 puff two times a day  #1 x 0   Entered and Authorized by:   Lemont Fillers FNP   Signed by:   Lemont Fillers FNP on 11/05/2009   Method used:   [facility]        [facility]  [place] [address] [facility]       [address]       [place], [place]  [address]       Ph: [phone] or [phone]       Fax: [phone]   RxID:   [id number] BYSTOLIC 5 MG TABS (NEBIVOLOL HCL) one by mouth once daily  #30 Tablet x 0   Entered and Authorized by:   Lemont Fillers FNP   Signed by:   Lemont Fillers FNP on 11/05/2009   Method used:   [facility]  [facility]        [facility]  [place] [address] [facility]       [address]       [place], [place]  [address]       Ph: [phone] or [phone]       Fax: [phone]   RxID:   [id number] SPIRIVA HANDIHALER 18 MCG CAPS (TIOTROPIUM BROMIDE MONOHYDRATE) Inhale 1 capsule every morning  #30 Capsule x 0   Entered and Authorized by:   Lemont Fillers FNP   Signed by:   Lemont Fillers FNP on 11/05/2009   Method used:   [facility]        [facility]  [place] [address]       [address]       [place], [place]  [address]       Ph: [phone] or [phone]       Fax: [phone]   RxID:   [id number]   Current Allergies (reviewed today): PREDNISONE

## 2010-05-05 NOTE — Progress Notes (Signed)
Summary: Bystolic refill & lab work question  Phone Note Call from Patient   Caller: Spouse Reason for Call: Talk to Nurse Summary of Call: Dylan [facility] say [facility], said they only received a denial from [place], [facility] [place] pt is out pt also is not sure if he needs to come in for blood work one week prior to next OV, pls advise Initial call taken by: Lannette Donath,  January 14, 2010 10:55 AM  Follow-up for Phone Call        call returned to patient at [phone],patients wife Dylan Owen answered. She was informed rx refill sent to pharmacy. As of last office note for patient, no blood work is needed.  Follow-up by: Glendell Docker CMA,  January 14, 2010 12:28 PM

## 2010-05-05 NOTE — Progress Notes (Signed)
Summary: Phnoe note  ---- Converted from flag ---- ---- 10/31/2007 11:40 AM, Valetta Close MD wrote: refill sent, though if he can tolerate it, he should increase to 1gram by mouth  two times a day  ---- 10/30/2007 4:44 PM, Chinita Pester RN wrote: Metformin 500mg  twice a day per pt.  ---- 10/30/2007 3:38 PM, Valetta Close MD wrote: Please contact Dylan Owen and ask him what dose of metformin he is taking, so I can accurately give him a refill.  Thank you. Phone Note Refill Request       Appended Document: Phnoe note Pt. called and informed he may increase Metformin to 1 gram two times a day if tolerated per Dr. Noel Owen. Stated  his blood sugars have been high lately; will probably increase dose.

## 2010-05-05 NOTE — Miscellaneous (Signed)
Summary: Order Confirmation for [facility]  Order Confirmation for [facility]   Imported By: Lanelle Bal 01/01/2010 12:25:57  _____________________________________________________________________  External Attachment:    Type:   Image     Comment:   External Document

## 2010-05-05 NOTE — Letter (Signed)
   Ama at [facility] [address] [place], [place]  [address]  [place] Phone: [phone]      February 03, 2010   [facility] [address] [place], [place] [address]  RE:  LAB RESULTS  Dear  Mr. KREMPASKY,  The following is an interpretation of your most recent lab tests.  Please take note of any instructions provided or changes to medications that have resulted from your lab work.  ELECTROLYTES:  Good - no changes needed  KIDNEY FUNCTION TESTS:  Good - no changes needed    DIABETIC STUDIES:  Fair - schedule a follow-up appointment Blood Glucose: 158   HgbA1C: 6.7   Microalbumin/Creatinine Ratio: 18.5          Sincerely Yours,    Dr. Thomos Lemons  Appended Document:  mailed

## 2010-05-05 NOTE — Letter (Signed)
Summary: Revised CMN for Oxygen/[facility]  Revised CMN for [facility]   Imported By: Lanelle Bal 12/30/2009 10:37:58  _____________________________________________________________________  External Attachment:    Type:   Image     Comment:   External Document

## 2010-05-05 NOTE — Medication Information (Signed)
Summary: Diabetes Supplies/[facility]  Diabetes [facility]   Imported By: Lanelle Bal 09/02/2009 08:29:04  _____________________________________________________________________  External Attachment:    Type:   Image     Comment:   External Document

## 2010-05-05 NOTE — Medication Information (Signed)
Summary: Diabetes Supplies/[facility]  [facility]   Imported By: Lanelle Bal 02/09/2010 12:49:46  _____________________________________________________________________  External Attachment:    Type:   Image     Comment:   External Document

## 2010-05-05 NOTE — Progress Notes (Signed)
Summary: prior auth for victoza denied  Phone Note Outgoing Call   Call placed by: [facility] Call placed to: Dylan Owen Summary of Call: prior authorization for Victoza 2 Pak 19 MCG /31ml pen started with Dylan Owen [id number] reference # [id number]  Initial call taken by: Roselle Locus,  January 28, 2010 12:31 PM  Follow-up for Phone Call        Received denial from [facility] for Victoza. Prior Auth denied because pt did not meet medical necessity requirements.  Pt must have an A1c greater than 7.0, which would support failure to current treatment. Denial forwarded to Provider for review. Nicki Guadalajara Fergerson CMA [name])  January 29, 2010 1:56 PM

## 2010-05-05 NOTE — Letter (Signed)
Summary: CMN for Oxygen/[facility]  CMN for [facility]   Imported By: Lanelle Bal 12/30/2009 09:22:09  _____________________________________________________________________  External Attachment:    Type:   Image     Comment:   External Document

## 2010-05-05 NOTE — Assessment & Plan Note (Signed)
Summary: ck on new bp meds/dt   Vital Signs:  Patient profile:   45 year old male Height:      73 inches Weight:      322.50 pounds BMI:     42.70 O2 Sat:      100 % on Room air Temp:     97.5 degrees F oral Pulse rate:   74 / minute Pulse rhythm:   regular Resp:     22 per minute BP sitting:   120 / 70  (right arm) Cuff size:   large  Vitals Entered By: Glendell Docker CMA (January 28, 2010 10:04 AM)  O2 Flow:  Room air CC: follow-up visit   Primary Care Provider:  [name]  CC:  follow-up visit.  History of Present Illness:  45 year old white male with chronic pain, type 2 diabetes and hypertension for followup. intermittent self-managed hypoglycemia (40-50), blood sugars range from 100-135AM, elevated blood sugars (300) usually in the evening  Patient reports recent URI symptoms. head congestion, fatigue  Hypertension-stable.  Chronic lower extremity edema  Preventive Screening-Counseling & Management  Alcohol-Tobacco     Cans of tobacco/week: 5  Allergies: 1)  Prednisone  Past History:  Past Medical History: COPD Diabetes mellitus, type II Hypertension  Low back pain Hx of Renal insufficiency attributed to ace-i use in 2007 Stable angina (nl Myoview 12/07 - followed by Dr. 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   Head:  normocephalic and atraumatic.   Ears:  R ear normal and L ear normal.   Nose:  mucosal edema.   Mouth:  pharynx pink and moist.   Neck:  No deformities,  masses, or tenderness noted.no carotid bruits.   Lungs:  normal respiratory effort and normal breath sounds.   Heart:  normal rate, regular rhythm, and no gallop.   Extremities:  trace left pedal edema and trace right pedal edema.   Psych:  normally interactive and good eye contact.      Impression & Recommendations:  Problem # 1:  ALLERGIC RHINITIS (ICD-477.9) use nasal steroids for chronic rhinitis  His updated medication list for this problem includes:    Fluticasone Propionate 50 Mcg/act Susp (Fluticasone propionate) .Marland Kitchen... 2 sprays each nostril once daily  Problem # 2:  DIABETES MELLITUS, TYPE II (ICD-250.00) pt having intermittent  low blood sugars stop glipizide trial of victoza  His updated medication list for this problem includes:    Metformin Hcl 1000 Mg Tabs (Metformin hcl) .Marland Kitchen... Take 1 tablet by mouth two times a day    Victoza 18 Mg/30ml Soln (Liraglutide) ..... Inject 1.2 mg once daily  Orders: T- Hemoglobin A1C ([address])  Problem # 3:  HYPERTENSION (ICD-401.9) Assessment: Improved consider add low-dose diuretic  Labs Reviewed: K+: 5.0 (05/29/2009) Creat: : 0.89 (05/29/2009)   Chol: 141 (09/27/2008)   HDL: 37 (09/27/2008)   LDL: 90 (09/27/2008)   TG: 68 (09/27/2008)  BP today: 120/70 Prior BP: 168/84 (12/05/2009)  Labs Reviewed: K+: 5.0 (05/29/2009) Creat: : 0.89 (05/29/2009)   Chol:  141 (09/27/2008)   HDL: 37 (09/27/2008)   LDL: 90 (09/27/2008)   TG: 68 (09/27/2008)  Problem # 4:  ANEMIA, VITAMIN B12 DEFICIENCY NEC (ICD-281.1) check intrinsic factor and anti-parietal cell antibody His updated medication list for this problem includes:    Cyanocobalamin 1000 Mcg/ml Soln (Cyanocobalamin) ..... Inject im once per month  Orders: Vit B12 1000 mcg (J3420) Admin of Therapeutic Inj  intramuscular or subcutaneous (29562) T- * Misc. Laboratory test [phone])  Hgb: 14.1 (02/08/2008)   Hct: 44.5 (02/08/2008)   Platelets: 363 (02/08/2008) RBC: 5.30  (02/08/2008)   RDW: 14.1 (02/08/2008)   WBC: 15.1 (02/08/2008) MCV: 84.0 (02/08/2008)   MCHC: 31.7 (02/08/2008) B12: 408 (11/05/2009)   Folate: >20.0 ng/mL (11/05/2009)   TSH: 0.738 (09/27/2008)  Complete Medication List: 1)  Cyanocobalamin 1000 Mcg/ml Soln (Cyanocobalamin) .... Inject im once per month 2)  Multivitamins Tabs (Multiple vitamin) .... Take 1 tablet by mouth once a day 3)  Spiriva Handihaler 18 Mcg Caps (Tiotropium bromide monohydrate) .... Inhale 1 capsule every morning 4)  Valium 5 Mg Tabs (Diazepam) .... Take 1 tablet by mouth every 6 hours as needed for anxiety and muscle spasms 5)  Zantac 150 Mg Tabs (Ranitidine hcl) .... Take 1 tablet by mouth once a day 6)  Albuterol 90 Mcg/act Aers (Albuterol) .... Inhale 2 puffs every 6 hours as needed for shortness of breath 7)  Nitroglycerin 0.4 Mg Subl (Nitroglycerin) .... Take 1 tablet under your tongue every 5 minutes, up to three times, as needed for chest pain 8)  Bystolic 10 Mg Tabs (Nebivolol hcl) .... One tablet by mouth daily 9)  Ms Contin 30 Mg Xr12h-tab (Morphine sulfate) .... Take one tab by mouth once every 8 hours 10)  Advair Diskus 250-50 Mcg/dose Misc (Fluticasone-salmeterol) .... Inhale 1 puff two times a day 11)  Onetouch Ultra Test Strp (Glucose blood) .... Please check your sugar before breakfast, before you go to sleep, and if you develop symptoms of lethargy or confusion. 12)  Metformin Hcl 1000 Mg Tabs (Metformin hcl) .... Take 1 tablet by mouth two times a day 13)  Victoza 18 Mg/55ml Soln (Liraglutide) .... Inject 1.2 mg once daily 14)  Cymbalta 60 Mg Cpep (Duloxetine hcl) .... Once daily 15)  Dialyvite Vitamin D 5000 5000 Unit Caps (Cholecalciferol) .... Take 1 capsule by mouth once a day 16)  O2 2l/min At Night  .... 2 l/min at bedtime. 17)  Fluticasone Propionate 50 Mcg/act Susp (Fluticasone propionate) .... 2 sprays each nostril once daily 18)  1st Choice Pen Needles 31g X 8 Mm Misc (Insulin pen needle) ....  Use as directed once daily  Other Orders: Influenza Vaccine MCR (57846) Flu Vaccine 52yrs + MEDICARE PATIENTS (N6295) T-Basic Metabolic Panel (284[address]-24401)  Patient Instructions: 1)  Stop glipizide 2)  Use victoza as directed 3)  Please schedule a follow-up appointment in 1 month. Prescriptions: METFORMIN HCL 1000 MG TABS (METFORMIN HCL) Take 1 tablet by mouth two times a day  #60 Tablet x 5   Entered and Authorized by:   D. Thomos Lemons DO   Signed by:   D. Thomos Lemons DO on 01/28/2010   Method used:   [facility]  [place] [address]       [address]       [place], [place]  [address]       Ph: [phone] or [phone]       Fax: [phone]  RxID:   [id number] BYSTOLIC 10 MG TABS (NEBIVOLOL HCL) one tablet by mouth daily  #30 Tablet x 5   Entered and Authorized by:   D. Thomos Lemons DO   Signed by:   D. Thomos Lemons DO on 01/28/2010   Method used:   [facility]        [facility]  [place] [address] [facility]       [address]       [place], [place]  [address]       Ph: [phone] or [phone]       Fax: [phone]   RxID:   [id number] 1ST CHOICE PEN NEEDLES 31G X 8 MM MISC (INSULIN PEN NEEDLE) use as directed once daily  #100 x 0   Entered and Authorized by:   D. Thomos Lemons DO   Signed by:   D. Thomos Lemons DO on 01/28/2010   Method used:   [facility]        [facility]  [place] [address]       [address]       [place], [place]  [address]       Ph: [phone] or [phone]       Fax: [phone]   RxID:   [id number] VICTOZA 18 MG/3ML SOLN (LIRAGLUTIDE) inject 1.2 mg once daily  #1 month x 1   Entered and Authorized by:   D. Thomos Lemons DO   Signed by:   D. Thomos Lemons DO on 01/28/2010   Method used:   [facility]        [facility]  [place] [address] [facility]       [address]       [place], [place]  [address]       Ph: [phone] or [phone]       Fax: [phone]   RxID:   [id number] FLUTICASONE PROPIONATE  50 MCG/ACT SUSP (FLUTICASONE PROPIONATE) 2 sprays each nostril once daily  #1 x 2   Entered and Authorized by:   D. Thomos Lemons DO   Signed by:   D. Thomos Lemons DO on 01/28/2010   Method used:   [facility]        [facility]  [place] [address]       [address]       [place], [place]  [address]       Ph: [phone] or [phone]       Fax: [phone]   RxID:   [id number]    Medication Administration  Injection # 1:    Medication: Vit B12 1000 mcg    Diagnosis: ANEMIA, VITAMIN B12 DEFICIENCY NEC (ICD-281.1)    Route: IM    Site: LUOQ gluteus    Exp Date: 06/03/2011    Lot #: [id number]    Mfr: [employer] Regent    Patient tolerated injection without complications    Given by: Glendell Docker CMA (January 28, 2010 10:38 AM)  Orders Added: 1)  Influenza Vaccine MCR [00025] 2)  Flu Vaccine 80yrs + MEDICARE PATIENTS [Q2039] 3)  Vit B12 1000 mcg [J3420] 4)  Admin of Therapeutic Inj  intramuscular or subcutaneous [96372] 5)  T- Hemoglobin A1C [83036-23375] 6)  T- * Misc. Laboratory test [99999] 7)  T-Basic Metabolic Panel [80048-22910] 8)  Est. Patient Level IV [19417]   Immunizations Administered:  Influenza Vaccine # 1:    Vaccine Type: Fluvax MCR    Site: left deltoid  Mfr: GlaxoSmithKline    Dose: 0.5 ml    Route: IM    Given by: Glendell Docker CMA    Exp. Date: 10/03/2010    Lot #: [id number]    VIS given: 10/28/09 version given January 28, 2010.  Flu Vaccine Consent Questions:    Do you have a history of severe allergic reactions to this vaccine? no    Any prior history of allergic reactions to egg and/or gelatin? no    Do you have a sensitivity to the preservative Thimersol? no    Do you have a past history of Guillan-Barre Syndrome? no    Do you currently have an acute febrile illness? no    Have you ever had a severe reaction to latex? no    Vaccine information given and explained to patient? yes   Immunizations Administered:  Influenza Vaccine #  1:    Vaccine Type: Fluvax MCR    Site: left deltoid    Mfr: GlaxoSmithKline    Dose: 0.5 ml    Route: IM    Given by: Glendell Docker CMA    Exp. Date: 10/03/2010    Lot #: [id number]    VIS given: 10/28/09 version given January 28, 2010.   Current Allergies (reviewed today): PREDNISONE    Medication Administration  Injection # 1:    Medication: Vit B12 1000 mcg    Diagnosis: ANEMIA, VITAMIN B12 DEFICIENCY NEC (ICD-281.1)    Route: IM    Site: LUOQ gluteus    Exp Date: 06/03/2011    Lot #: [id number]    Mfr: American Regent    Patient tolerated injection without complications    Given by: Glendell Docker CMA (January 28, 2010 10:38 AM)  Orders Added: 1)  Influenza Vaccine MCR [00025] 2)  Flu Vaccine 58yrs + MEDICARE PATIENTS [Q2039] 3)  Vit B12 1000 mcg [J3420] 4)  Admin of Therapeutic Inj  intramuscular or subcutaneous [96372] 5)  T- Hemoglobin A1C [83036-23375] 6)  T- * Misc. Laboratory test [99999] 7)  T-Basic Metabolic Panel [80048-22910] 8)  Est. Patient Level IV [[address]]

## 2010-05-05 NOTE — Assessment & Plan Note (Signed)
Summary: 2 week follow up/mhf--Rm 5   Vital Signs:  Patient profile:   45 year old male Height:      73 inches Weight:      324 pounds BMI:     42.90 Temp:     98.4 degrees F oral Pulse rate:   84 / minute Pulse rhythm:   regular Resp:     18 per minute BP sitting:   168 / 84  (right arm) Cuff size:   large  Vitals Entered By: Mervin Kung CMA Dylan Owen) (December 05, 2009 11:16 AM) CC: Rm 5   2 week follow up. Hands & feet swelling x 1 week. Has sinus drainage and just hasn't felt well x 1 weeki.  Is Patient Diabetic? Yes Comments Hasn't had B12 injection since end of May or June. Pt states Spiriva and Adviar are not covered by insurance, would like samples. Pt states he is on oxygen at night but does not currently have anyone following him for this--needs referral to pulm? Dylan Owen CMA Dylan Owen)  December 05, 2009 10:21 AM    Primary Care Provider:  [name]  CC:  Rm 5   2 week follow up. Hands & feet swelling x 1 week. Has sinus drainage and just hasn't felt well x 1 weeki. Marland Kitchen  History of Present Illness: Dylan Owen is a 45 year old male who presents today for follow up.  Notes that he has had some swelling in his hands and legs.  Notes some sinus drainage (bad taste in mouth from sinus drainage).  Denies fever.  Sugar was up as high as 280 this week.  + Malaise.  Also complains of bad pain in his right wrist and right elbow.     Allergies: 1)  Prednisone  Past History:  Past Medical History: Last updated: 06/26/2009 COPD Diabetes mellitus, type II Hypertension Low back pain Hx of Renal insufficiency attributed to ace-i use in 2007 Stable angina (nl Myoview 12/07 - followed by Dr. 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 Head:  Normocephalic and atraumatic without obvious abnormalities. + maxillary tenderness to palpation. Mouth:  Oral mucosa and oropharynx without lesions or exudates.  Teeth in good repair. Neck:  No deformities, masses, or tenderness noted. Lungs:  Normal respiratory effort, chest expands symmetrically. Lungs are clear to auscultation, no crackles or wheezes. Heart:  Normal rate and regular rhythm. S1 and S2 normal without gallop, murmur, click, rub or other extra sounds.   Impression & Recommendations:  Problem # 1:  SINUSITIS (ICD-473.9) Assessment New Will plan to treat with ceftin.   His updated medication list for this problem includes:    Ceftin 500 Mg Tabs (Cefuroxime axetil) ..... One tablet by mouth two times a day x 10 days  Problem # 2:  ELBOW PAIN, RIGHT (ICD-719.42) Check x-ray of right elbow and wrist.  Check uric acid level to assess for gout. Orders: T-DG Elbow Complete*R* ([address]) T-Uric Acid (Blood) ([address]-09811)  Problem # 3:  DIABETES MELLITUS, TYPE II (ICD-250.00) Assessment: Comment Only Last A1C was ok.  Glucose likely elevated  due to acute infection.   His updated medication list for this problem includes:    Metformin Hcl 1000 Mg Tabs (Metformin hcl) .Marland Kitchen... Take 1 tablet by mouth two times a day    Glipizide 10 Mg Tabs (Glipizide) ..... One by mouth in am and 1/2 to one tab in pm  Labs Reviewed: Creat: 0.89 (05/29/2009)     Last Eye Exam: normal (11/04/2008) Reviewed HgBA1c results: 6.6 (11/05/2009)  7.1 (05/29/2009)  Complete Medication List: 1)  Cyanocobalamin 1000 Mcg/ml Soln (Cyanocobalamin) .... Inject im once per month 2)  Multivitamins Tabs (Multiple vitamin) .... Take 1 tablet by mouth once a day 3)  Spiriva Handihaler 18 Mcg Caps (Tiotropium bromide monohydrate) .... Inhale 1 capsule every  morning 4)  Valium 5 Mg Tabs (Diazepam) .... Take 1 tablet by mouth every 6 hours as needed for anxiety and muscle spasms 5)  Zantac 150 Mg Tabs (Ranitidine hcl) .... Take 1 tablet by mouth once a day 6)  Albuterol 90 Mcg/act Aers (Albuterol) .... Inhale 2 puffs every 6 hours as needed for shortness of breath 7)  Nitroglycerin 0.4 Mg Subl (Nitroglycerin) .... Take 1 tablet under your tongue every 5 minutes, up to three times, as needed for chest pain 8)  Bystolic 10 Mg Tabs (Nebivolol hcl) .... One tablet by mouth daily 9)  Ms Contin 30 Mg Xr12h-tab (Morphine sulfate) .... Take one tab by mouth once every 8 hours 10)  Advair Diskus 250-50 Mcg/dose Misc (Fluticasone-salmeterol) .... Inhale 1 puff two times a day 11)  Onetouch Ultra Test Strp (Glucose blood) .... Please check your sugar before breakfast, before you go to sleep, and if you develop symptoms of lethargy or confusion. 12)  Metformin Hcl 1000 Mg Tabs (Metformin hcl) .... Take 1 tablet by mouth two times a day 13)  Glipizide 10 Mg Tabs (Glipizide) .... One by mouth in am and 1/2 to one tab in pm 14)  Cymbalta 60 Mg Cpep (Duloxetine hcl) .... Once daily 15)  Dialyvite Vitamin D 5000 5000 Unit Caps (Cholecalciferol) .... Take 1 capsule by mouth once a day 16)  O2 2l/min At Night  .... 2 l/min at bedtime. 17)  Ceftin 500 Mg Tabs (Cefuroxime axetil) .... One tablet by mouth two times a day x 10 days  Other Orders: T-DG Wrist 2 Views*R* (73100) Admin of Therapeutic Inj  intramuscular or subcutaneous ([address]) Vit B12 1000 mcg (U0454)  Patient Instructions: 1)  Please complete your x-rays and blood work downstairs.  2)  Call if you develop fever over 101, increasing sinus pressure, pain with eye movement, increased facial tenderness of swelling, or if you develop visual changes. 3)  Please follow up in 1 month, sooner if your symptoms worsen or do not improve. Prescriptions: BYSTOLIC 10 MG TABS (NEBIVOLOL HCL) one tablet by mouth daily   #30 x 0   Entered and Authorized by:   Dylan Fillers FNP   Signed by:   Dylan Fillers FNP on 12/05/2009   Method used:   Print then Give to Patient   RxID:   [id number] ADVAIR DISKUS 250-50 MCG/DOSE MISC (FLUTICASONE-SALMETEROL) Inhale 1 puff two times a day  #2 x 0   Entered and Authorized by:   Dylan Fillers FNP   Signed by:   Dylan Fillers FNP on 12/05/2009   Method used:   Samples Given   RxID:   [id number] SPIRIVA HANDIHALER 18 MCG CAPS (TIOTROPIUM BROMIDE MONOHYDRATE) Inhale 1 capsule every morning  #10  x 0   Entered and Authorized by:   Dylan Fillers FNP   Signed by:   Dylan Fillers FNP on 12/05/2009   Method used:   Samples Given   RxID:   [id number] BYSTOLIC 10 MG TABS (NEBIVOLOL HCL) one tablet by mouth daily  #28 x 0   Entered and Authorized by:   Dylan Fillers FNP   Signed by:   Dylan Fillers FNP on 12/05/2009   Method used:   Samples Given   RxID:   [id number] CEFTIN 500 MG TABS (CEFUROXIME AXETIL) one tablet by mouth two times a day x 10 days  #20 x 0   Entered and Authorized by:   Dylan Fillers FNP   Signed by:   Dylan Fillers FNP on 12/05/2009   Method used:   [facility]        [facility]  [place] [address]       [address]       [place], [place]  [address]       Ph: [phone] or [phone]       Fax: [phone]   RxID:   [id number]    Current Allergies (reviewed today): PREDNISONE   Medication Administration  Injection # 1:    Medication: Vit B12 1000 mcg    Diagnosis: ANEMIA, VITAMIN B12 DEFICIENCY NEC (ICD-281.1)    Route: IM    Site: L deltoid    Exp Date: 06/03/2011    Lot #: [id number]    Mfr: [employer] Regent    Patient tolerated injection without complications    Given by: Mervin Kung CMA (AAMA) (December 05, 2009 11:17 AM)  Orders Added: 1)  T-DG Elbow Complete*R* [73080] 2)  T-Uric Acid (Blood) [84550-23180] 3)  T-DG Wrist 2  Views*R* [73100] 4)  Admin of Therapeutic Inj  intramuscular or subcutaneous [96372] 5)  Vit B12 1000 mcg [J3420] 6)  Est. Patient Level III [84166]

## 2010-05-05 NOTE — Miscellaneous (Signed)
Summary: Order for Oxygen/[facility]   Order for Oxygen/[facility]   Imported By: Lanelle Bal 01/12/2010 11:22:28  _____________________________________________________________________  External Attachment:    Type:   Image     Comment:   External Document

## 2010-05-05 NOTE — Progress Notes (Signed)
Summary: Dr Westley Chandler does not accept his insurance   Phone Note Call from Patient Call back at Home Phone [phone]   Caller: patient wife judy Call For: yoo  Summary of Call: was referred to Dr Westley Chandler and they do not accept his insurance   needs to go to a docotr that does  Initial call taken by: Roselle Locus,  July 01, 2009 10:57 AM  Follow-up for Phone Call        Spoke with harlie ragle ,she will ck with Insurance for names that are in network and call me back   Follow-up by: Darral Dash,  July 02, 2009 2:29 PM

## 2010-05-05 NOTE — Progress Notes (Signed)
Summary: Status Update  ---- Converted from flag ---- ---- 11/05/2009 4:03 PM, Lemont Fillers FNP wrote: Would you pls call patient's wife and ask her who is managing his home o2 and sleep apnea etc (I do not believe that it is Dr. Artist Pais.) thanks ------------------------------  Phone Note Outgoing Call   Call placed by: Glendell Docker CMA,  November 13, 2009 8:36 AM Call placed to: Patient Summary of Call: Second attempt to contact patient, and no return call from.  Call placed to patient at [phone], he states that [facility] is monitoring his O2 and patient states  he was seen at [facility] for sleep apnea and attempted to have the sleep study done twice, but  he states that he was claustrophobic and was not able to wear the mask and the test was not completed. He states the oxygen was the best choice. When asked if his wife was available, he states she has taken her mother to the doctor and he will have her return the call. Initial call taken by: Glendell Docker CMA,  November 13, 2009 8:37 AM  Follow-up for Phone Call        Patients wife returned phone call. [name] states patient was being seen at the outpatient clinic at [facility] and he stopped going there. He has been using  2 liters of oxygen  at bed time and during the day as needed . Otherwise [name] states patient is not being followed by anyone at this time for his sleep apnea or COPD. Patient is scheduled for follow up on 8/17 with Melissa Follow-up by: Glendell Docker CMA,  November 13, 2009 4:11 PM

## 2010-05-05 NOTE — Progress Notes (Signed)
Summary: Dylan Owen rx  Phone Note Outgoing Call   Summary of Call: Faxed order for O2 concentrator @ 2L/min ambulating/ via nasal cannula to [phone]. Nicki Guadalajara Fergerson CMA [name])  December 22, 2009 8:58 AM

## 2010-05-05 NOTE — Progress Notes (Signed)
Summary: REFILL REQUEST  Phone Note Refill Request Message from:  Fax from Pharmacy on May 15, 2009 11:06 AM  Refills Requested: Medication #1:  METOPROLOL TARTRATE 25 MG TABS Take 1 tablet by mouth two times a day   Dosage confirmed as above?Dosage Confirmed   Brand Name Necessary? No   Supply Requested: 1 month   Last Refilled: 04/06/2009  Medication #2:  METFORMIN HCL 1000 MG TABS Take 1 tablet by mouth two times a day   Dosage confirmed as above?Dosage Confirmed   Brand Name Necessary? No   Supply Requested: 1 month   Last Refilled: 04/06/2009  Method Requested: Electronic Next Appointment Scheduled: NONE Initial call taken by: Roselle Locus,  May 15, 2009 11:07 AM  Follow-up for Phone Call        Patient f/u was cx, and patient is actually due for f/u now. Is it ok to give 1 refill with notation that office visit is needed?  Follow-up by: Lucious Groves,  May 15, 2009 11:13 AM  Additional Follow-up for Phone Call Additional follow up Details #1::        ok for one refill only Additional Follow-up by: D. Thomos Lemons DO,  May 15, 2009 12:03 PM    Additional Follow-up for Phone Call Additional follow up Details #2::    Done. Follow-up by: Lucious Groves,  May 15, 2009 12:57 PM  Prescriptions: METFORMIN HCL 1000 MG TABS (METFORMIN HCL) Take 1 tablet by mouth two times a day  #62 x 1   Entered by:   Lucious Groves   Authorized by:   D. Thomos Lemons DO   Signed by:   Lucious Groves on 05/15/2009   Method used:   [facility]  [place] [address] [facility]       [address]       [place], [place]  [address]       Ph: [phone] or [phone]       Fax: [phone]   RxID:   [id number] METOPROLOL TARTRATE 25 MG TABS (METOPROLOL TARTRATE) Take 1 tablet by mouth two times a day  #60 x 1   Entered by:   Lucious Groves   Authorized by:   D. Thomos Lemons DO   Signed by:   Lucious Groves on 05/15/2009   Method used:   [facility]   [facility]  [place] [address]       [address]       [place], [place]  [address]       Ph: [phone] or [phone]       Fax: [phone]   RxID:   [id number]

## 2010-05-05 NOTE — Assessment & Plan Note (Signed)
Summary: f/u - jr   Vital Signs:  Patient profile:   45 year old male Height:      73 inches Weight:      322 pounds BMI:     42.64 O2 Sat:      100 % on Room air Temp:     98.3 degrees F oral Pulse rate:   75 / minute Pulse rhythm:   regular Resp:     22 per minute BP sitting:   120 / 70  (left arm) Cuff size:   large  Vitals Entered By: Glendell Docker CMA (June 26, 2009 9:05 AM)  O2 Flow:  Room air CC: Rm 3- Follow up disease management, Type 2 diabetes mellitus follow-up   Primary Care Provider:  DThomos Lemons DO  CC:  Rm 3- Follow up disease management and Type 2 diabetes mellitus follow-up.  History of Present Illness:  Type 2 Diabetes Mellitus Follow-Up      This is a 45 year old man who presents for Type 2 diabetes mellitus follow-up.   The patient denies the following symptoms: chest pain.  Since the last visit the patient reports fair dietary compliance, compliance with medications, and monitoring blood glucose.    He could not afford [place].  he went back to taking 10 mg of glipizide two times a day. he has episodes of sweating .  he has not checked blood sugar to see if episode related to hypoglycemia.  he takes second dose of glipizide at bedtime.   Allergies: 1)  Prednisone  Past History:  Past Medical History: COPD Diabetes mellitus, type II Hypertension Low back pain Hx of Renal insufficiency attributed to ace-i use in 2007 Stable angina (nl Myoview 12/07 - followed by Dr. 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  occ gets rash along his groin (no rash now)  Physical Exam  General:  alert and overweight-appearing.   Lungs:  normal respiratory effort and normal breath sounds.   Heart:  normal rate, regular rhythm, and no gallop.   Abdomen:  soft and non-tender.   Extremities:  trace left pedal edema and trace right pedal edema.    Diabetes Management Exam:    Foot Exam (with socks and/or shoes not present):       Inspection:          Left foot: normal          Right foot: normal   Impression & Recommendations:  Problem # 1:  DIABETES MELLITUS, TYPE II (ICD-250.00) Januvia is cost prohibitive.  I am concerned pt may be having hypoglycemia.  Pt advised to check CBG when he gets sweaty.  also when he gets irritable.   reduce evening glipizide dose and take with dinner.  The following medications were removed from the medication list:    Januvia 100 Mg Tabs (Sitagliptin phosphate) ..... One by mouth qd His updated medication list for this problem includes:  Metformin Hcl 1000 Mg Tabs (Metformin hcl) .Marland Kitchen... Take 1 tablet by mouth two times a day    Glipizide 10 Mg Tabs (Glipizide) ..... One by mouth in am and 1/2 to one tab in pm  Problem # 2:  HYPERTENSION (ICD-401.9) Change metoprolol to bystolic.  metoprolol likely aggravating DM control  His updated medication list for this problem includes:    Bystolic 5 Mg Tabs (Nebivolol hcl) ..... One by mouth once daily  BP today: 120/70 Prior BP: 120/70 (01/02/2009)  Labs Reviewed: K+: 5.0 (05/29/2009) Creat: : 0.89 (05/29/2009)   Chol: 141 (09/27/2008)   HDL: 37 (09/27/2008)   LDL: 90 (09/27/2008)   TG: 68 (09/27/2008)  Problem # 3:  DEPRESSION (ICD-311) Pain mgt physician changed pt to cymbalta.  he struggles with depressive symptoms. refer to psych for further eval.  The following medications were removed from the medication list:     Fluoxetine Hcl 20 Mg Caps (Fluoxetine hcl) .Marland Kitchen... Take 1 tablet by mouth once a day His updated medication list for this problem includes:    Valium 5 Mg Tabs (Diazepam) .Marland Kitchen... Take 1 tablet by mouth every 6 hours as needed for anxiety and muscle spasms    Cymbalta 60 Mg Cpep (Duloxetine hcl) ..... Once daily  Orders: Psychiatric Referral (Psych)  Discussed treatment options, including trial of antidpressant medication. Will refer to behavioral health. Follow-up call in in 24-48 hours and recheck in 2 weeks, sooner as needed. Patient agrees to call if any worsening of symptoms or thoughts of doing harm arise. Verified that the patient has no suicidal ideation at this time.   Complete Medication List: 1)  Cyanocobalamin 1000 Mcg/ml Soln (Cyanocobalamin) .... Inject im once per month 2)  Multivitamins Tabs (Multiple vitamin) .... Take 1 tablet by mouth once a day 3)  Spiriva Handihaler 18 Mcg Caps (Tiotropium bromide monohydrate) .... Inhale 1 capsule every morning 4)  Valium 5 Mg Tabs (Diazepam) .... Take 1 tablet by mouth every 6 hours as needed for anxiety and muscle spasms 5)  Zantac 150 Mg Tabs (Ranitidine hcl) .... Take 1 tablet by mouth once a day 6)  Albuterol 90 Mcg/act Aers (Albuterol) .... Inhale 2 puffs every 6 hours as needed for shortness of breath 7)  Nitroglycerin 0.4 Mg Subl (Nitroglycerin) .... Take 1 tablet under your tongue every 5 minutes, up to three times, as needed for chest pain 8)  Bystolic 5 Mg Tabs (Nebivolol hcl) .... One by mouth once daily 9)  Ms Contin 30 Mg Xr12h-tab (Morphine sulfate) .... Take one tab by mouth once every 8 hours 10)  Advair Diskus 250-50 Mcg/dose Misc (Fluticasone-salmeterol) .... Inhale 1 puff two times a day 11)  Onetouch Ultra Test Strp (Glucose blood) .... Please check your sugar before breakfast, before you go to sleep, and if you develop symptoms of lethargy or confusion. 12)  Metformin Hcl 1000 Mg Tabs (Metformin hcl) .... Take 1 tablet by  mouth two times a day 13)  Glipizide 10 Mg Tabs (Glipizide) .... One by mouth in am and 1/2 to one tab in pm 14)  Cymbalta 60 Mg Cpep (Duloxetine hcl) .... Once daily 15)  Dialyvite Vitamin D 5000 5000 Unit Caps (Cholecalciferol) .... Take 1 capsule by mouth once a day  Patient Instructions: 1)  Please schedule a follow-up appointment in 2 months. Prescriptions: GLIPIZIDE 10 MG TABS (GLIPIZIDE) one by mouth in AM and 1/2 to one tab in PM  #60 x 2   Entered and Authorized by:  [name]   Signed by:   D. Thomos Lemons DO on 06/26/2009   Method used:   Electronically to        [facility]  [place] [address] (retail)       [address]       [place], [place]  [address]       Ph: [phone] or [phone]       Fax: [phone]   RxID:   [id number] BYSTOLIC 5 MG TABS (NEBIVOLOL HCL) one by mouth once daily  #30 x 2   Entered and Authorized by:   D. Thomos Lemons DO   Signed by:   D. Thomos Lemons DO on 06/26/2009   Method used:   [facility]        [facility]  [place] [address] (retail[facility]       [address]       [place], [place]  [address]       Ph: [phone] or [phone]       Fax: [phone]   RxID:   [id number]   Current Allergies (reviewed today): PREDNISONE

## 2010-05-05 NOTE — Medication Information (Signed)
Summary: Denial for Victoza/[employer]  Denial for Victoza/[employer]   Imported By: Lanelle Bal 02/09/2010 12:48:47  _____________________________________________________________________  External Attachment:    Type:   Image     Comment:   External Document

## 2010-05-07 NOTE — Letter (Signed)
Summary: CM[facility] for Diabetes Supplies[facility]  [facility] for Diabetes Supplies/[facility]   Imported By: Lanelle Bal 03/25/2010 11:43:16  _____________________________________________________________________  External Attachment:    Type:   Image     Comment:   External Document

## 2010-08-04 ENCOUNTER — Telehealth: Payer: Self-pay | Admitting: Internal Medicine

## 2010-08-04 MED ORDER — METFORMIN HCL 1000 MG PO TABS: 1000.0000 mg | ORAL_TABLET | Freq: Two times a day (BID) | ORAL | Status: DC

## 2010-08-04 NOTE — Telephone Encounter (Signed)
Refill-metformin hcl 1000mg  tablet. Take 1 tablet twice a day. Qty 60. Last fill 4.4.12

## 2010-08-04 NOTE — Telephone Encounter (Signed)
Pt last seen 01/28/10. Was supposed to have returned for f/u in 1 month. Pt needs appointment. Attempted to reach pt and received busy signal. 30 day supply sent to pharmacy with note that pt must be seen before further refills can be given.

## 2010-08-18 NOTE — Assessment & Plan Note (Signed)
[facility]                            CARDIOLOGY OFFICE NOTE   KEISHAUN, HAZEL                    MRN:          [id number]  DATE:09/15/2006                            DOB:          [birth year]    Mr. Bernales is seen today in followup. He had a fairly prolonged  hospitalization in February. Orrin unfortunately is a markedly  overweight, debilitated, young male who has atypical chest pain and  chronic pain syndrome who was admitted to the hospital in February with  altered mental status, rhabdomyolysis, pancreatitis, question of  pneumonia and acute kidney failure with a creatinine up to 6.9. It is  not clear what happened to [place]. He may have taken too many pain  medicines at home and gotten dehydrated. He had been started on  hydrochlorothiazide and lisinopril which were stopped in the hospital  due to his kidney failure.   In talking to Tahmir, he has recovered fairly well from his episode in  February. In regards to his chest pain, it is actually chronic muscular  pain, it is not anginal. It all starts in his back. He has multiple  previous back surgeries. He sees Dr. Vear Clock for pain management, Dr.  __________  at the [facility] and also has a psychiatrist that  he sees.   His weight continues to go up. I am not sure there is a good solution  for this since he is very sedentary and says he cannot do any type of  activity due to neuropathy and back pain.   From our perspective, he has had a nonischemic Myoview in December 2007  with a normal EF.   Despite all of his problems in the hospital, he had no significant  cardiac problems.   I will have to check through the records to see if an echo was done.   REVIEW OF SYSTEMS:  Currently primarily remarkable for chronic back pain  and neuropathy as well as weakness, otherwise negative.   MEDICATIONS:  Numerous. They include:  1. Cymbalta 20 b.i.d.  2. MS Contin t.i.d.  3. Diazepam b.i.d.  4. MSIR for breakthrough pain 30 mg q.6.  5. Neurontin 300 t.i.d.  6. Ranitidine 150 b.i.d.  7. Albuterol and Advair inhalers.  8. Metformin 1 gram b.i.d.  9. Spiriva inhaler.   His Metoprolol, hydrochlorothiazide and lisinopril have been stopped. He  apparently is suppose to wear oxygen at night but does not always do  this. He is on glipizide 10 b.i.d., amlodipine 10 a day.   PHYSICAL EXAMINATION:  GENERAL:  Markedly overweight white male in some  distress from back pain.  VITAL SIGNS:  His weight is 342, respiratory rate is 18, blood pressure  is 135/78, pulse 75 and regular. He is afebrile.  HEENT:  Normal.  NECK:  Carotids are normal without bruits. There is no JVP elevation, no  thyromegaly and no lymphadenopathy.  LUNGS:  Clear with normal diaphragmatic motion. No wheezing.  HEART:  There is an S1, S2 with distant heart sounds. PMI is not  palpable.  ABDOMEN:  Protuberant. There is no tenderness.  Bowel sounds are  positive. No hepatosplenomegaly, no hepatojugular reflux. No AAA.  Femorals are difficult to palpate due to their depth. There is no  obvious bruits.  EXTREMITIES:  There is +1 edema bilaterally. He has peripheral  neuropathy. There is no varicosities.  NEUROLOGIC:  Benign and there is currently no muscular weakness.   His electrocardiogram  is essentially normal.   IMPRESSION:  1. Previous atypical chest pain with nonischemic Myoview and no      indication for further aggressive workup. I told [place] that      despite all of his medical problems including intubation and kidney      failure, his heart did not act up and this was a good stress test      in itself. Apparently his medical doctors took him off his beta      blockers due to his chronic obstructive pulmonary disease. I will      leave it up to them to see if it needs to be reinitiated since his      blood pressure is fine and he has no documented coronary disease.  2.  Hypertension currently well controlled on Norvasc. His      hydrochlorothiazide and lisinopril have been stopped due to      previous kidney failure and rhabdomyolysis. He will continue his      Norvasc and try to adhere to a low-salt diet.  3. Diabetes. Followup with primary care MD for hemoglobin A1c on a      quarterly basis. Chaim does not seem to be monitoring his sugar      at home. He will have to be careful with his metformin since this,      I am sure, exacerbated his acidosis requiring intubation and kidney      failure.  4. Morbid obesity. The patient will continue to try to work on his      diet. He cannot exercise due to chronic back problems.  5. Chronic obstructive pulmonary disease. The patient does not smoke      but chews tobacco. I cautioned him against this because he also      drinks and is at increased risk for mouth cancer. He does not seem      motivated to quit. I do not think he is a good candidate for      nicotine replacement given his previous substance abuse and chronic      pain medicines.   I will see Yvette back in 6 months.     Noralyn Pick. Eden Emms, MD, [facility]  Electronically Signed    PCN/MedQ  DD: 09/15/2006  DT: 09/15/2006  Job #: [id number]

## 2010-08-21 NOTE — H&P (Signed)
[facility]  Patient:    Dylan Owen, Dylan Owen                    MRN: [id number] Proc. Date: 05/18/99 Adm. 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  HISTORY OF PRESENT ILLNESS:  The patient is a 45 year old right-handed individual who works at [employer].  He states that on February 04, 1999, of this year he developed a rather sudden and severe onset of pain in his low back  after he was lifting some boxes.  He initially felt he had a catching sensation in his back.  He was seen by his family physician Dr. Adriana Simas, and after some initial  treatments with medication he noted that the pain had not gotten any better and  ultimately an MRI was performed that demonstrated a herniated nucleus pulposus t the L5-S1 level just centrally causing some effacement of the common dural tube and the S1 nerve roots but o overt compression of either one.  The patient has had pain since that period of time and he has been efforts at conservative management including epidural steroids injections, oral steroid medication, nonsteroidal anti-inflammatories in addition to physical therapy and exercise programs.  The pain has been persisting, has settled out in his left lower extremity and recently an outpatient myelogram demonstrated that the patient has a large herniated nucleus pulposus at the L5-S1 level eccentric to the left side.  There is effacement of the left S1 nerve root.  He also degenerative disk disease elsewhere in his back at L3-L4 and to a lesser extent on L4-5.  But because of is persistent left lumbar radicular symptoms he has been advised undergoing surgical microdiskectomy at the L5-S1 level.  The patient notes that bowel and bladder control have not been affected.  He denies any  difficulty with coughing or sneezing and he they do not seem to aggravate his pain.  He notes that he has been in significant distress with pain so overwhelming that he cannot return to the work place at this time.  PAST MEDICAL HISTORY:  Reveals that he had surgery in 1990 on his back and surgery in 1996 in the groin area for causes that are unclear.  He takes no medication n a chronic basis.  He does not smoke.  He does not drink alcohol.  He has weight gain and currently his is 260 pounds and he is 6 foot 3 inches in height.  FAMILY HISTORY:  Reveals that his mother is age 67 and has high blood pressure.  Father is age 37 and has some heart disease.  SOCIAL HISTORY:  Reveals that the patient is married and has small children at he home.  REVIEW OF SYSTEMS:  Notable for the items in the history of present illness, history of an irregular heart beat but no other significant findings on all the  systems including HEENT, cardiovascular, respiratory, gastrointestinal, and urologic.  PHYSICAL EXAMINATION:  GENERAL/NEUROLOGIC:  The patient is alert and oriented individual in moderate distress with back pain.  He will stand straight and erect with difficulty but oes prefer a 10 degree forward stoop.  His motor strength in the lower extremities reveals iliopsoas, quadriceps, tibialis anterior and gastrocs have good  strength to tone bulk to confrontation.  Deep tendon reflexes are 2+ in the patellae, 1+ in the Achilles, Babinskis are downgoing.  CRANIAL NERVES EXAMINATION:  Reveals the pupils at 3 mm, briskly reactive to light and accommodation.  The extraocular movements are full with asymmetric to grimace. Tongue and uvula are in the midline.  Sclerae and conjunctivae are clear. Upper extremity strength is normal in all the major groups tested.  Biceps and triceps, reflexes are 2+ and symmetric.  The Achilles reflex is absent on the left side, 1+ on the right side.   Straight leg raising is positive on the left side at 30 degrees.  LUNGS:  Clear to auscultation.  HEART:  Regular rate and rhythm.  ABDOMEN:  Soft.  Bowel sounds are positive.  No masses are palpable.  EXTREMITIES:  Reveal no cyanosis, clubbing or edema.  IMPRESSION:  The patient has a herniated nucleus pulposus at L5-S1 eccentric to the left side.  He is now being admitted to undergo surgical ______ of the disk. DD:  05/18/99 TD:  05/18/99 Job: [id number]

## 2010-08-21 NOTE — Discharge Summary (Signed)
NAME:  Dylan Owen, Dylan Owen NO.:  [id number]   MEDICAL RECORD NO.:  [id number]          PATIENT TYPE:  INP   LOCATION:  5506                         FACILITY:  [facility]   PHYSICIAN:  Eliseo Gum, M.D.   DATE OF BIRTH:  [birth year]   DATE OF ADMISSION:  12/23/2004  DATE OF DISCHARGE:  12/25/2004                                 DISCHARGE SUMMARY   DISCHARGE DIAGNOSES:  1.  Shortness of breath secondary to tracheal bronchitis.  2.  Newly diagnosed diabetes mellitus, type 2.  3.  Low back pain secondary to work-related injury on chronic pain      medications, followed by Dr. Vear Clock, phone number [phone].  4.  Iron-deficiency anemia.  5.  Hypokalemia.  6.  Tobacco abuse with smokeless tobacco.  7.  History of obstructive sleep apnea.  8.  Elevated liver function tests.   DISCHARGE MEDICATIONS:  1.  Cymbalta 20 mg p.o. b.i.d.  2.  Ranitidine 150 mg p.o. b.i.d.  3.  Morphine sulfate/MS Contin 100 mg p.o. t.i.d.  4.  Diazepam 5 mg p.o. t.i.d.  5.  MSIR 30 mg p.o. q.6h. p.r.n.  6.  Neurontin continue on home dose.  7.  Lidocaine patch continue on home regimen.  8.  Albuterol MDI two puffs q.4h. p.r.n.  9.  Advair 100/50, one puff b.i.d.  10. Metformin 500 mg p.o. b.i.d.   CONDITION ON DISCHARGE:  Stable and improved.   1.  The patient is to follow up with Dr. Landis Martins in the Outpatient Clinic at      [facility] on December 30, 2004 at 3 p.m. at which time he      should have a STAT CBC and B-MET to followup.  2.  The patient will continue his pain clinic regimen with Dr. Vear Clock.   PROCEDURES:  1.  CT angio of the chest, on December 23, 2004, showed no evidence of PE.      No acute cardiopulmonary disease.  Mild diffuse fatty infiltrates of the      liver and shotty mediastinal and bilateral hilar adenopathy,      nonspecific.  2.  Chest x-ray, on December 23, 2004, showed no acute disease and      cardiomegaly.   CONSULTATIONS:  None.   ADMISSION  HISTORY AND PHYSICAL WITH LABORATORY DATA:  The patient, Mr.  Owen, is a 45 year old Caucasian male with a past medical history  significant for chronic low back pain, status post laminectomy, treated at a  pain clinic who presents to the emergency department with a one week history  of increased shortness of breath, diarrhea, and chest tightness.  He was  also complaining of increased swelling/fluid buildup in bilateral legs and  arms.  He had similar symptoms last year when he had pneumonia.  Two weeks  ago he had nasal congestion, sore throat, and chest congestion.  He denied  nausea, vomiting, fever, or chills.   PHYSICAL EXAMINATION:  VITAL SIGNS:  Temperature 96.3, blood pressure  153/84, pulse 88, respirations 26, and he was sating at 96% on room air.  GENERAL:  He  was an obese man in some distress with audible wheezing.  EYES:  Pupils were equally round and reactive to light and accommodation.  Extraocular movements were intact.  ENT:  No exudates or erythema in the oropharynx.  There were some particles  of tobacco noted in his mouth.  NECK:  Obese with no thyromegaly.  LUNGS:  Showed diffuse rhonchi over both lung fields, coarse breath sounds  but no egophony.  HEART:  Regular in rate and rhythm with no murmurs, rubs, or gallops.  GI:  Positive bowel sounds.  Soft, nontender, nondistended.  EXTREMITIES:  Edematous hands and feet, non-pitting.  Pulses 1+.  No  clubbing, cyanosis.  GU:  Deferred.  SKIN:  Pale.  Many freckles.  LYMPH:  No cervical lymphadenopathy.  NEURO:  Cranial nerves II-XII are grossly intact.  Strength and sensation  5/5 bilateral upper extremities with decreased sensation in bilateral lower  extremities and 3/5 strength bilaterally in the lower extremities as well.  PSYCH:  Appropriate.   ADMISSION LABORATORY:  White blood cell count 8.2, hemoglobin 12.1,  hematocrit 36.5, MCV 82.4, platelets 281, absolute neutrophil count was 4.3.  PT was 12.2, INR  0.9.  D-dimmer 0.76.  Sodium 136, potassium 3.5, chloride  97, bicarb 28, glucose 161, BUN 6, creatinine 0.9, calcium 8.6, total  protein 6.9.  Albumin 3.3.  AST 40, ALT 43, alk phos 60, total bili of 0.5.  Hemoglobin A1c of 9.3.  CK 70, CK-MB 1.0, troponin I 0.02.  UDS was positive  for benzodiazepines and opiates otherwise was negative.  A UA was negative  except for 15 ketones.  Blood cultures were drawn on the day of admission  and showed no growth after five days.   HOSPITAL COURSE:  1.  Shortness of breath.  This is most likely secondary to tracheal      bronchitis as the patient responded very nicely to empiric treatment      with antibiotics and nebulizer treatments.  The patient was switched      over to p.o. antibiotics and metered-dose inhaler as well as Advair on      the day of discharge, tolerated these medications very well and had      clinically and symptomatically improved and was therefore, discharged.      His shortness of breath may also be secondary to his body habitus as      well as his history of obstructive sleep apnea and the patient was      started back on CPAP at night which he tolerated very well and his      shortness of breath was doing better as a result.  2.  Newly diagnosed diabetes mellitus, type 2.  The patient came in with an      elevated glucose level and his fasting glucose levels remained elevated      so, a hemoglobin A1c was tested and was found to be 9.3; therefore, the      patient was diagnosed with diabetes and the patient received diabetic      counseling and education  inpatient.  His wife is also a diabetic and      was helping the patient to understand his newly diagnosed disease.  The      patient was setup with a Glucometer to use at home and was started on      metformin 500 mg p.o. b.i.d. with which we achieved moderate glucose     control.  The patient was  told to monitor his glucose every morning and      to bring in a record of  his glucose levels when he is followed up as an      outpatient in the clinic.  3.  Iron-deficiency anemia.  When the patient was admitted his hemoglobin      was 12.1, when he was discharged it was 11.8.  He was found to be iron      deficient with an iron of 34, TIBC 282, percent sat 12.  B-12 was 519.      Serum folate was 10.9 and ferritin was 158.  We do not have any stool      studies on this patient; therefore, it is unknown if he is FOBT positive      or not.  It should be considered as an outpatient to start the patient      on iron as well as obtain stool cards to determine if the patient is      FOBT positive, if so he needs to have a colonoscopy as an outpatient.      The patient's hemoglobin remained stable while inpatient and the patient      was asymptomatic, therefore, it was not worked up further.  4.  Hypokalemia.  This is most likely secondary to poor p.o. intake.  A      magnesium was not checked.  However, when the patient was discharged his      potassium was normal at 3.9.  When he was admitted his potassium was 3.5      which is actually normal but on the low end of normal; therefore, he was      given supplementation.  It should be considered as an outpatient to      recheck a STAT B-MET on hospital followup and to check a magnesium level      as well so that both may be replenished as needed.  The patient uses      smokeless tobacco which may be the cause of his hypokalemia and the      patient should be encouraged to desist in using smokeless tobacco in      order to improve his health as well as improve his hypokalemic state.  5.  Elevated liver function tests.  This is most likely secondary to the      fatty infiltrates of the liver found on CT of the patient's chest as      well as his obesity and diabetes mellitus, and we are treating the      underlying causes and no further workup is necessary at this time.      However, this should be monitored at least on  a yearly basis as an      outpatient.   DISCHARGE LABORATORY:  On the day of discharge the patient's white blood  cell count was 6.7, hemoglobin 11.8, hematocrit 35.3, MCV 83.3, platelets  315.  __________  was 28.  Sodium 138, potassium 3.9, chloride 101, bicarb  32, glucose 202, BUN 6, creatinine 0.8, calcium 8.6.  Total protein 6.4,  albumin 3.0, AST 52, ALT 47, alk phos 57, total bili 0.6,  LDH 183.  Total  cholesterol 125, triglycerides 84, HDL 32, LDL 76.  Angiotensin converting  enzymes 42 which was normal.  TSH 2.980.   DISCHARGE VITAL SIGNS:  Temperature 97.7, pulse 79, respirations 20, blood  pressure 106/70.  CBG was 106 and he was sating at  95% on room air.   PENDING LABORATORY:  None.   Discharge summary was by Chauncey Reading, M.D. for Eliseo Gum, M.D.      Chauncey Reading, M.D.    ______________________________  Eliseo Gum, M.D.   EA/MEDQ  D:  02/11/2005  T:  02/12/2005  Job:  [id number]   cc:   Chauncey Reading, M.D.  Fax: [phone]

## 2010-08-21 NOTE — Discharge Summary (Signed)
[facility]  Patient:    Dylan Owen, Dylan Owen                    MRN: [id number] Adm. Date:  [id number] Disc. 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  PROCEDURES:  L5-S1 lumbar laminectomy and diskectomy, posterior lumbar body interbody fusion with Synthes bone spacer, and pedicular fixation with iliac crest bone graft from right iliac crest.  COMPLICATIONS:  None.  DISCHARGE STATUS:  Alive and well.  HISTORY OF PRESENT ILLNESS:  Mr. Naclerio is a 45 year old gentleman who has a recurrent disk at L5-S1.  He initially had a diskectomy on May 18, 1999, due to problems in his left lower extremity.  He continued to have problems and had a severe degenerative disk space at L5-S1 on a follow-up MRI.  It was recommended that he undergo a posterior interbody fusion with pedicle screw, augmentation at L4-5 and recurrent diskectomy.  Mr. Minchey agreed and was admitted.  HOSPITAL COURSE:  The patient was taken to the operating room and had an uncomplicated procedure.  Postoperatively, he had some swelling along his right flank and bruising from the surgery, but that did improve.  He had normal strength in the lower extremities.  He was walking well with a walker. He did receive physical therapy.  He was voiding and also tolerating a regular diet.  DISCHARGE MEDICATIONS:  He will be discharged home with Xanax for spasms and Percocet 10 mg for pain.  FOLLOW-UP:  Return appointment to see Stefani Dama, M.D., in approximately two and a half weeks. DD:  10/05/99 TD:  10/05/99 Job: [id number]

## 2010-08-21 NOTE — Discharge Summary (Signed)
NAME:  Dylan Owen, Dylan Owen NO.:  [id number]   MEDICAL RECORD NO.:  [id number]          PATIENT TYPE:  INP   LOCATION:  3005                         FACILITY:  [facility]   PHYSICIAN:  Alvester Morin, M.D.  DATE OF BIRTH:  [birth year]   DATE OF ADMISSION:  05/07/2006  DATE OF DISCHARGE:  05/17/2006                               DISCHARGE SUMMARY   DISCHARGE DIAGNOSES:  1. Ventilator-dependent respiratory failure - resolved.  2. Community-acquired pneumonia.  3. Hypotension, multifactorial - resolved.  4. Acute renal failure, secondary to dehydration, medications, and      rhabdomyolysis - resolving.  5. Metabolic acidosis, secondary to acute renal failure - resolved.  6. Hyperkalemia, secondary to acute renal failure - resolved.  7. Hypokalemia, secondary to recurrent nausea and vomiting and      diarrhea.  8. Chronic diarrhea.  9. Acute pancreatitis, likely drug-induced.  10.Altered mental status, secondary to unintentional prescription      narcotics overdose.  11.Hypertension.  12.Chronic obstructive pulmonary disease.  13.Type 2 diabetes.  14.Anemia with vitamin B12 deficiency.  15.Chronic back pain, status post multiple surgeries.  16.Peripheral neuropathy.  17.Gastroesophageal reflux disease.  18.Tobacco abuse.  19.History of alcohol abuse.  20.History of obstructive sleep apnea.   DISCHARGE MEDICATIONS:  1. Norvasc 5 mg p.o. daily.  2. Multivitamin one tab p.o. daily.  3. Vitamin B12 1000 mcg IM daily, last dose on May 11, 2006, then      weekly for three weeks, then monthly for one year.  4. Glipizide 10 mg p.o. daily before breakfast.  5. Avelox 400 mg p.o. daily times two days.  6. Spiriva 18 mcg one capsule inhaled daily.  7. Valium 5 mg p.o. daily p.r.n. anxiety.  8. Zantac 150 mg p.o. daily.  9. Albuterol MDI two puffs q.6h. p.r.n. shortness of breath.  10.Potassium chloride 40 mEq p.o. daily, first dose on the night of      discharge and  then every morning for four additional days.  11.Tylenol 650 mg p.o. q.6h. p.r.n. pain.  12.Nitroglycerin 0.4 mg sublingual q.5 minutes times three p.r.n.      chest pain, patient instructed to dial 911 if more than one tablet      needed.  13.Neurontin 300 mg p.o. t.i.d.  14.Cymbalta 30 mg p.o. b.i.d.   DISPOSITION/FOLLOW UP:  At the time of discharge, the patient was in  good condition.  His respiratory failure, hypotension, altered mental  status, nausea and vomiting had resolved.  He was tolerating a regular  diet without difficulty.  He will be contacted by the outpatient clinic  with a follow-up appointment to be done in the next one to two weeks.  Additionally, arrangements have been made for home health to administer  the vitamin B12 shot for the first week.  A basic metabolic panel will  also be drawn by home health on Friday, May 20, 2006 to monitor his  potassium and renal function.  The results of this will be called to  myself and further adjustments to his potassium and diabetes regimen  will be made at that time, since  the patient continued to have diarrhea  on the day of discharge.  Finally, arrangements for physical therapy  have also been made through home health.   PROCEDURE:  1. CT scan without contrast of the head and cervical spine:  This was      performed on May 07, 2006 and revealed a normal, unenhanced      cranial CT.  No cervical spine fractures were identified.  2. CT scan abdomen and pelvis without contrast:  This was performed on      May 07, 2006 and revealed no acute abnormalities in the abdomen      or pelvis.  3. Renal ultrasound:  This was performed on May 08, 2006 and      showed normal-appearing kidneys by ultrasound.  No hydronephrosis      or medical renal disease was found.  4. Left internal jugular central venous catheter:  This was placed      under ultrasound guidance on May 09, 2006.  The patient      tolerated this  procedure without difficulty.   CONSULTING PHYSICIAN:  1. Dr. Shelle Iron, [facility].  2. Dr. Arlean Hopping, [facility].   BRIEF ADMITTING HISTORY AND PHYSICAL:  Dylan Owen is a 45 year old man  with a history of type 2 diabetes, COPD, and chronic back pain requiring  several surgeries who presented to the emergency department on May 07, 2006 because his wife was concerned about his appearance and mental  status.  The patient reported that for the two to three days leading up  to his admission that he had been feeling more fatigued than usual.  Additionally, he fell approximately one week prior to admission, landing  on his back.  Since that time, he had experienced increasing neck pain.  On the morning of admission, the patient's wife came home and found Mr.  Owen seated on the couch, bathed in sweat.  He also appeared to be  lethargic and had marked dizziness.  Additionally, he complained of  tunnel vision and diplopia.  The patient has a history of chest pain and  shortness of breath which continued up until his presentation at the  hospital.  Because of his chronic back pain, the patient is on a regimen  of MS Contin 100 mg t.i.d. as well as MSIR for breakthrough pain.  He  denied escalating this dosage recently.   PHYSICAL EXAMINATION:  VITAL SIGNS:  Temperature of 98.2.  Blood  pressure 153/73.  Later 71/34, then 96/47.  Pulse of 111.  Respirations  18.  Oxygen saturation 97% on room air.  GENERAL:  The patient appears somnolent, but is in no acute distress.  HEENT:  Pupils are equal, round and reactive to light and accommodation.  Extraocular eye movements are intact.  Sclerae are anicteric.  Oropharynx is clear with dry mucous membranes.  His head is  normocephalic, atraumatic.  NECK:  Supple without lymphadenopathy.  He has bilateral posterior neck muscle tenderness.  No significant tenderness over the cervical spinous  processes is  appreciated.  RESPIRATORY:  Lungs are clear to auscultation bilaterally with good air  movement.  CARDIOVASCULAR:  The patient is tachycardic, but has a regular rhythm.  No murmurs, rubs or gallops are appreciated.  No carotid bruits are  noted.  ABDOMEN:  Normoactive bowel sounds are present.  The abdomen is obese.  It is also soft with mild epigastric tenderness without rebound or  guarding.  EXTREMITIES:  Trace  lower extremity edema is present.  His toes appear  cool and slightly cyanotic.  Trace radial pulses are found.  His lower  extremity distal pulses are not palpable, but can be detected using  Doppler.  GU:  The patient has a bilateral inguinal rash consistent with a  candidal infection.  SKIN:  The patient has multiple excoriated patches on his right leg and  arm.  LYMPH:  No supraclavicular or cervical lymphadenopathy is noted.  MUSCULOSKELETAL:  The patient is moving his upper and lower extremities  bilaterally.  NEURO:  Cranial nerves II through XII are intact.  He has 4/5 strength  in his upper and lower extremities bilaterally.  Sensation is grossly  intact.  No nuchal rigidity is noted.  PSYCH:  The patient is alert and oriented times three.  His mood and  affect are appropriate.   ADMISSION LABORATORY DATA:  White blood cell count 20.4, hemoglobin  13.4, hematocrit 40.0, platelets 366,000, ANC 13.6, ALC 3.8, MCV 86.1.  Sodium 140, potassium 5.0, chloride 106, bicarbonate 21, BUN 36,  creatinine 6.9, glucose 141, anion gap 14, bilirubin 0.6, alkaline  phosphatase 56, AST 68, ALT 54, total protein 6.6, albumin 3.3, calcium  8.5, magnesium 2.3.  Point of care markers:  Myoglobin greater than 500,  CK-MB 33, troponin-I less than 0.05, alcohol less than 5, total CK 3115,  CK-MB 41.0, troponin-I 0.09, relative index 2.3, ESR 12.   EKG:  Sinus tachycardia with a rate of 117 is noted.  Intervals are  normal.  No other significant abnormalities are seen.   Chest x-ray:   No acute cardiopulmonary findings are seen.   HOSPITAL COURSE:  Problem #1 - Ventilator-dependent respiratory failure:  Initially the patient did not appear to be in any significant  respiratory distress.  However, following admission to the intensive  care unit, he became disoriented and was noticeably tachypneic.  Given  his altered mental status, hyperventilation, and significant acidosis,  we were concerned about the patient's ability to protect his airway.  With the assistance of critical care medicine, the patient was sedated  and intubated on May 09, 2006.  He was subsequently extubated  without difficulty on May 11, 2006.  His breathing remained stable  thereafter.  He was continued on albuterol and Atrovent nebulizers.  However, prior to discharge, these were transitioned to Spiriva and as-  needed albuterol.   Problem #2 - Community-acquired pneumonia:  On admission, our suspicion for a pneumonia was low, though an infection was likely given his  leukocytosis of 20.4.  As his blood pressure began to decline rapidly,  he was started on broad-spectrum antibiotics with vancomycin and Zosyn  out of fear that he had developed septic shock.  On the day following  his admission, a chest x-ray revealed a new interstitial opacity in the  left lung.  We were concerned that this represented pneumonia and the  patient was continued on antibiotics.  Blood and sputum cultures were  obtained, but revealed no significant growth.  Mr. Paredez was continued  on vancomycin and Zosyn for a total of seven days, after which time he  was switched to Avelox.  This was continued for an additional five days.  At the time of discharge, the patient's breathing was at baseline.  He  will continue on oral Avelox for two days following discharge.   Problem #3 - Hypotension:  On initial presentation, the patient's blood  pressure was normal at 153/73.  However, it rapidly declined,  reaching a  nadir  of 71/34 while in the emergency department.  Aggressive fluid  resuscitation was initiated, to which the patient responded to somewhat.  Our differential at that time included septic versus cardiogenic shock.  Additionally, we were concerned that opioid overdose may be contributing  to his symptoms.  In the emergency department, he received 0.4 mg of  Narcan with a minimal response.  Fluid boluses were continued and the  patient's blood pressure initially improved.  However, upon reaching the  intensive care unit, he again became hypotensive.  Central venous access  was established and his central venous pressure was found to be normal.  At that time, fluid boluses were held and he was started on  norepinephrine.  He had a good response to this and was able to be  weaned without difficulty.  At the time of discharge, the patient was  actually hypertensive.   Problem #4 - Acute renal failure:  The initial basic metabolic panel on  arrival revealed a markedly elevated creatinine of 6.9.  His baseline  creatinine during a previous admission in October of 2007 was 0.9.  The  cause of his acute renal failure was felt to be multifactorial,  including volume depletion, recent initiation of hydrochlorothiazide and  an ACE inhibitor on April 19, 2006, and mild to moderate  rhabdomyolysis.  As noted above, the patient was hydrated aggressively,  but had a continued rise in his creatinine.  It reached a maximum of 7.3  on hospital day #2, but began trending downward shortly thereafter.  Nephrology was consulted and felt that rehydration was the most  appropriate management.  At the time of discharge, the patient's  creatinine was much improved, but still slightly above his baseline at  1.4.  Diuretics and ACE inhibitors should be avoided at least until his  creatinine has returned to baseline.  Additionally, close monitoring of his renal function should be performed thereafter as this acute  renal  failure could be a result of bilateral renal artery stenosis.  Further  workup of that will be deferred to the patient's primary care physician.   Problem #5 - Metabolic acidosis:  On admission, the patient was found to  be mildly acidotic with a bicarbonate of 21.  However, his anion gap was  mildly increased at 14.  An arterial blood gas obtained shortly  thereafter revealed a combined respiratory and metabolic acidosis with a  pH of 7.21.  The cause of his metabolic acidosis was felt to be acute  renal failure.  This resolved with improvement in his renal function.  At the time of discharge, his bicarbonate was normal at 23 and his anion  gap was 9.   Problem #6 - Hyperkalemia:  The patient had a borderline elevated  potassium at presentation of 5.0.  However, this subsequently trended  upward with a maximum of 5.9 on hospital day #2.  This was felt to be  secondary to his acute renal failure.  He received two doses of rectal  Kayexalate which improved his potassium rapidly.  Continuous telemetry  monitoring was performed and at no time did the patient have any EKG  findings suggestive of severe hyperkalemia.  As his renal function  improved, his potassium remained normal or slightly low.  Routine follow-  up is encouraged.   Problem #7 - Hypokalemia:  As noted above, the patient was hyperkalemic  upon presentation.  However, this rapidly resolved.  In fact, his  potassium remained low-normal  or slightly below normal during the latter  stages of his hospitalization.  This was felt to be due to recurrent  diarrhea and episodes of vomiting.  On the day of discharge, his  potassium was found to be low at 2.9.  It was repleted orally before  discharge.  Additionally, the patient was given samples of potassium  chloride to be taken on the night of discharge, as well as for four  additional days.  A basic metabolic panel will be obtained by home  health on Friday, May 20, 2006 to ensure that his potassium has  normalized.  Further adjustments to his potassium regimen will be done  at that time if necessary.   Problem #8 - Chronic diarrhea:  Mr. Sedeno reports having chronic  diarrhea at home that has been ongoing for many years.  Initially, this  was not a problem for him during the hospitalization.  However, he  developed profound diarrhea after being transferred out of the intensive  care unit and starting a liquid diet.  He was re-hydrated with normal  saline and did not become hypotensive.  However, as noted above, his  potassium remained low.  Stool studies for C. Difficile were sent and  were found to be negative.  It is presumed that his diarrhea is  multifactorial including an element of chronically loose stools coupled  with antibiotic-associated diarrhea.  Prior to discharge, his diarrhea  had improved considerably.  The patient was encouraged to stay well- hydrated and to notify the clinic if his diarrhea worsens.   Problem #9 - Acute pancreatitis:  After the patient was transferred out  of the intensive care unit, he was started on a liquid diet.  Initially  he tolerated this but then suddenly developed copious bilious emesis.  A  complete metabolic panel and lipase were obtained and revealed a mildly  elevated lipase.  This was followed serially and trended upward,  reaching a maximum of 173.  The exact cause of this is unknown, but it  was presumed that one of the medications started during this  hospitalization was to blame.  All of the new medications that were not  essential were held, and his nausea and vomiting improved.  Additionally, his lipase was trending down and had reached 93 on the day  of discharge.  His symptoms should be followed up when he returns to the  clinic.   Problem #10 - Altered mental status:  Upon presentation, the patient was  felt to not be at his baseline mental status.  His wife stated that he  had been  somewhat disoriented for one to two days prior to his  presentation.  Based on all of the factors above, it was felt that he  had developed an acute delirium due to excessive circulating opioids  from heavy opioid use and impaired renal clearance, as well as a new  metabolic acidosis.  His mental status initially continued to decline,  requiring intubation as noted above.  While intubated, he had several  episodes of acute agitation requiring high doses of sedation.  He was  also started on antipsychotics to improve his agitation.  Once  extubated, his mental status gradually improved.  At the time of  discharge, he was alert and oriented times three and was felt to be at  his baseline.  He is to continue using his previously prescribed valium  as needed, but only for severe anxiety as he should avoid any  medications  that may alter his mental status.   Problem #11 - Hypertension:  Initially the patient was hypotensive.  However, once this resolved, his blood pressure became moderately to  markedly elevated.  His previous regimen of lisinopril and  hydrochlorothiazide was avoided secondary to acute renal failure.  Also,  his history of COPD made beta blockers a less desirable choice.  He was  thus started on Norvasc.  However, once his nausea and vomiting began,  he was switched to as-needed doses of hydralazine and labetalol.  This  controlled his blood pressure reasonably well.  Prior to discharge, he  was switched back to Norvasc and is to continue taking this as an  outpatient.  Further adjustments would be made as necessary when he  returns for follow-up.  As noted above, if he is restarted on an ACE  inhibitor in the future, his creatinine should be monitored very closely  in case he has undiagnosed bilateral renal artery stenosis.   Problem #12 - COPD:  On admission, the patient's COPD did not appear to be exacerbated.  However, he was started on albuterol and Atrovent nebs.   Prior to discharge, these were transitioned to his home regimen of  Spiriva and as-needed albuterol inhalers.   Problem #13 - Type 2 diabetes:  At the time of admission, the patient  was started on the intensive care unit hyperglycemia protocol.  The  patient tolerated this well and was transitioned to regular doses of  Lantus and sliding scale insulin.  Prior to admission, he was taking  only metformin.  However, because of his impaired renal function, this  was not restarted during this hospitalization or at the time of  discharge.  He will be sent home on glipizide 10 mg daily.  Adjustments  to this medication may be needed when he returns for follow-up.  Additionally, if his creatinine returns to baseline, he may be restarted  on metformin.   Problem #14 - Anemia and vitamin B12 deficiency:  Initially the patient  was found to have a borderline-low hemoglobin of 13.4.  However, this  trended down with aggressive fluid hydration.  At no point did he  develop signs or symptoms that necessitated transfusion.  An anemia  panel was obtained and was remarkable for a borderline-low vitamin B12  of 255.  Methylmalonic and homocystine levels were subsequently checked  and revealed an elevated methylmalonic acid.  At that time, the patient  was started on vitamin B12 injections, which should continue daily for a  total course of one week.  After that time, he will receive weekly  injections for three weeks and then monthly injections for one year.  The weekly injections will be administered by home health.  Following  that, he will be seen in the outpatient clinic for B12 injections.  Problem #15 - Chronic back pain:  The patient has a history of chronic  back pain and multiple surgeries.  Per him and his wife, he was  previously diagnosed with arachnoiditis.  For these conditions, he was  prescribed MS Contin 100 mg t.i.d. and MSIR 30 mg q.6h. p.r.n. for  breakthrough pain prior to  discharge.  However, it was felt that many of  his presenting symptoms were secondary to excessive opioids and  therefore these were avoided as much as possible during the  hospitalization.  At the time of discharge, his back pain was reasonably  well-controlled with a combination of Tylenol and minimal IV morphine.  It was  felt that he should not be restarted on narcotics at that time  and he is to use Tylenol for control of his pain.  Further adjustments  to his analgesic regimen will be deferred to the outpatient clinic, his  pain specialist, and his neurosurgeon.   Problem #16 - Peripheral neuropathy:  The patient has a history of  peripheral neuropathy secondary to diabetes and his back pathology.  He  was previously on Neurontin and Cymbalta.  However, because of his  altered mental status at the time of presentation, these were held.  He  is to restart these following discharge.  Problem #17 - Tobacco abuse:  The patient currently uses chewing  tobacco.  He was strongly counseled to avoid this.  This should continue  to be encouraged when he returns for follow-up.   Problem #18 - History of alcohol abuse:  Although the patient denies  drinking alcohol at this time, we were concerned that he may continue to  drink because of his altered mental status on admission.  He was  monitored closely for signs and symptoms of withdrawal, but did not  exhibit any.  He was also started on thiamine and folic acid.  At the  time of discharge, he is to continue a daily multivitamin and to abstain  from drinking alcohol.   DISCHARGE LABORATORY DATA:  White blood cell count 13.1, hemoglobin  10.3, hematocrit 31.4, platelets 352,000.  Sodium 146, potassium 2.9,  chloride 114, bicarbonate 23, BUN 7, creatinine 1.5, glucose 178,  alkaline phosphatase 49, AST 66, ALT 62, total protein 5.9, albumin 2.6,  calcium 7.5, magnesium 2.1, lipase 93, triglycerides 179, homocystine  12.7, methylmalonic acid  519, total iron 24, total iron binding capacity  215, vitamin B12 of 255, serum folate 4.8, ferritin 214, TSH 1.67.  Blood culture negative times four.  Urine culture positive for 40,000  colonies of enterococcus.  Stool C. Diff toxin negative.   DISCHARGE VITAL SIGNS:  Temperature 98.8.  Blood pressure 164/81.  Pulse  88.  Respirations 20.  Oxygen saturation 97% on room air.  Capillary  blood glucose 126.      Yvonne Kendall, M.D.  Electronically Signed      Alvester Morin, M.D.  Electronically Signed    CE/MEDQ  D:  05/17/2006  T:  05/18/2006  Job:  [id number]   cc:   Chauncey Reading, D.O.  Maree Krabbe, M.D.  Barbaraann Share, MD,FCCP  Stefani Dama, M.D.  Alvester Morin, M.D.

## 2010-08-21 NOTE — Op Note (Signed)
[place]. [facility]  Patient:    Dylan Owen, Dylan Owen                    MRN: [id number] Proc. Date: 05/18/99 Adm. 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  OPERATION:  L5-S1 laminotomy and microdiskectomy with microdissection technique.  SURGEON:  Stefani Dama, M.D.  FIRST ASSISTANT:  Payton Doughty, M.D.  ANESTHESIA:  General endotracheal anesthesia.  INDICATIONS:  The patient is a 45 year old individual who has had significant back and bilateral lower extremity pain that has now become primarily left lower extremity pain.  Myelogram demonstrates a herniated nucleus pulposus at the L5-S1 level that has now failed conservative management for three-month period.  DESCRIPTION OF PROCEDURE:  The patient was brought to the operating room supine on a stretcher.  After submitting to general endotracheal anesthesia, he was turned prone.  His back was shaved, prepped with DuraPrep, and draped in a sterile fashion.  Midline incision was created and carried down to the lumbodorsal fascia which was opened on the left side of the midline.  The first identifiable spinous process was noted to be that of L5 on the radiograph.  The interlaminar space at L5-S1 was then cleared, and the lamina was dissected open.  The inferior marginal lamina of L5 was removed out to the lateral wall facet.  The yellow ligament was taken up in this region.  Dissection was then carried down inferiorly over S1. The dissection was carried out laterally.  With microdissection technique and using the operating microscope, the epidural tissue from around the nerve root and the common dural tube was dissected.  The veins were cauterized and divided  using microdissection technique, and the S1 nerve root could be mobilized. Underneath the S1 nerve root at its takeoff was found to be disk protrusion which was subligamentous in nature.  The disk space was opened with a #15 blade.  An osteophyte from the inferior margin of L5 was already growing there.  This was taken down with the osteophyte rongeur.  Dissection was then continued into the  disk space, and a significant quantity of markedly degenerated disk material was removed from the L5-S1 space.  With the nerve root being retracted medially, a diskectomy was completed using microdissection technique and a combination of curets and rongeurs.  The disk space was emptied of any and all loose disk material from within it.  Once this was accomplished, osteophytes from the medial aspect of the disk space were then cleared using a small osteotome and the osteophyte rongeur.  This allowed for better relaxation of the common dural tube and the S1 nerve root.  Hemostasis was then achieved from the soft tissues.  The nerve root was noted to be well relaxed, and 1 cc of fentanyl was left in the epidural space, and a small fat graft was placed over the common dural tube and the S1 nerve root. The retractor was removed, and then the lumbodorsal fascia was closed with #1 Vicryl in interrupted fashion, 2-0 Vicryl was used in the subcutaneous tissue, nd 3-0 Vicryl was used subcuticularly.  A clear plastic dressing was placed on the  skin.  The  patient tolerated the procedure well and was returned to recovery in  stable condition. DD:  05/18/99 TD:  05/18/99 Job: [id number]

## 2010-08-21 NOTE — Assessment & Plan Note (Signed)
[facility]                            CARDIOLOGY OFFICE NOTE   Dylan Owen, Dylan Owen                    MRN:          [id number]  DATE:04/19/2006                            DOB:          09-15-1965    Mr. Dylan Owen returns today for followup.  I saw him in the hospital as a  consult for somewhat atypical chest pain and multiple coronary risk  factors.  He is in a chronic pain management center for his back  problems and radiculopathy.  This has included MS-Contin and Neurontin.   He has significant sleep apnea.   He had a followup adenosine Myoview here in our office, which was normal  with a good EF.   He continues to have atypical pain, which is not necessarily exertional.  He has labile hypertension and is a diabetic.  We had him on metoprolol,  but he did not tolerate this medicine.  He said he just felt lousy on  it.  After discussing with his wife, I told him I thought it would be  reasonable for him to start lisinopril hydrochlorothiazide 20/12.5.   The patient does have labile hypertension and some lower extremity  edema.   He will start this medicine and followup with Dr. Darrick Huntsman in regards to  his BMET and further care of his blood pressure.   EXAM:  He is overweight.  Blood pressure 130/72, pulse 80 and regular.  HEENT:  Normal.  LUNGS:  Clear.  Carotids have no bruit.  There is an S1, S2 with distant heart sounds.  ABDOMEN:  Benign.  Distal pulses are intact with trace edema bilaterally.   IMPRESSION:  1. Atypical chest pain, nonischemic Myoview.  Continue current      therapy.  Would like to avoid heart cath if possible.  Increased      risk of bleeding due to obesity.  2. Hypertension, labile with diabetes.  Will start lisinopril      hydrochlorothiazide both in terms of his edema and      correcting/sparing effect on his kidneys.  He will followup with      Dr. Darrick Huntsman for a BMET.   He will continue to followup in the pain  management clinic as well as  with his psychiatrist for his bipolar disorder.   He has significant sleep apnea and is on home O2.  The better this is  treated, the easier his blood pressure will be treated.  He will follow  up with our lung doctors for this.   Overall, I do not see an indication for cath, and I think that he will  benefit from the lisinopril hydrochlorothiazide in regards to his risk  factors and blood pressure.     Noralyn Pick. Eden Emms, MD, [facility]  Electronically Signed    PCN/MedQ  DD: 04/19/2006  DT: 04/19/2006  Job #: [id number]

## 2010-08-21 NOTE — H&P (Signed)
[place]. [address] [facility]  Patient:    Dylan Owen, Dylan Owen                    MRN: [id number] Adm. Date:  [id number] Attending:  Jonne Ply                         History and Physical  ADMISSION DIAGNOSES:  Lumbar spondylosis and stenosis at L3-4 and L4-5, status post arthrodesis at L5-S1.  HISTORY OF PRESENT ILLNESS:  Mr. Dylan Owen is a 45 year old individual who has had significant problems with back and lower extremity pain.  He underwent surgery initially for a herniated nucleus pulposus after a work-related incident.  He had evidence of significant degenerative disk disease at multiple levels.  His first diskectomy was in February 2001, and he continued to have problems with a left lumbar radiculopathy, despite that.  On September 29, 1999, he underwent further decompression and stabilization via the posterior interbody arthrodesis technique at the L5-S1 level.  Since that time he has had continued bilateral lower extremity pain.  Further workup, including myelography demonstrates that he has had progression of stenosis at the L3-4 level, in addition to degenerative changes at the L4-5 level.  The patient was advised regarding surgical decompression and stabilization of those levels. He is now being admitted to undergo that surgery.  PAST MEDICAL HISTORY:  The patients general health has been fair.  He had an initial back operation in 1990.  The level is unknown, but is suspected it was L4-5 on the right.  The patient had surgery in the groin area in 1996, for no specified reasons.  CURRENT MEDICATIONS:  He takes no medications on a chronic basis.  He has had some hydrocodone for pain.  SOCIAL HISTORY:  He does not drink alcohol.  He has had a weight gain of some 30 pounds.  He currently states his weight is 265 pounds, but he appears heavier than that.  He states his height is at 6 feet 3 inches.  The patient is married.  He has two small children.   His wife is currently employed, having had some recent neck surgery herself.  FAMILY HISTORY:  His mother is age 70, and has high blood pressure.  His father is age 54, and has heart disease.  No significant medical problems are noted otherwise.  REVIEW OF SYSTEMS:  Not notable for any other items than the history of present illness, other than those noted.  PHYSICAL EXAMINATION:  GENERAL:  He is an alert individual in moderate distress with his back.  He stands straight and erect in a very slow and deliberate fashion.  He tends to favor a 10-degree forward stoop.  NEUROLOGIC:  His motor strength in the lower extremities reveals the iliopsoas, quadriceps, tibialis anterior, and gastrocnemius have normal strength, tone, and bulk to confrontation.  Deep tendon reflexes are 2+ in the patellae, 1+ in the Achilles.  Babinski downgoing bilaterally.  Straight leg raising is positive at 30 degrees in either lower extremity for significant back pain.  Sensation intact in both distal lower extremities.  In the upper extremities strength and reflexes are normal.  Range of motion in his neck is limited to 30 degrees, turning to the right and to the left.  He extends 30 degrees and flexes nearly fully, touching his chin to his chest, and  axial compression does not reproduce any discomfort.  Cranial nerve examination reveals the pupils  are 4.0 mm, briskly reactive to light and accommodation. The extraocular movements are full.  Face is symmetric to grimace.   Tongue and uvula are in the midline.  Sclerae and conjunctivae are clear.  Fundi reveal the discs to be flat.  NECK:  No masses, no bruits heard.  LUNGS:  Clear to auscultation.  HEART:  A regular rate and rhythm.  ABDOMEN:  Soft, bowel sounds positive.  There are no masses palpable.  EXTREMITIES:  No cyanosis, clubbing, or edema.  IMPRESSION:  The patient has evidence of spondylitic disease at the lower lumbar spine with the  presence of stenosis at L3-4 and at L4-5.  PLAN:  He has been advised regarding surgical decompression and stabilization of the same.  He is now being admitted for that procedure. DD:  10/11/00 TD:  10/11/00 Job: [id number]

## 2010-08-21 NOTE — Discharge Summary (Signed)
[place]. [facility]  Patient:    Dylan Owen, Dylan Owen                    MRN: [id number] Adm. Date:  [id number] Disc. Date: [id number] Attending:  Jonne Ply Dictator:   Stefani Dama, M.D.                           Discharge Summary  ADMITTING DIAGNOSIS: 1.  Herniated nucleus pulposus, L5-S1, with left lumbar radiculopathy.  DISCHARGE DIAGNOSIS: 1.  Herniated nucleus pulposus, L5-S1, with left lumbar radiculopathy.  OPERATION/PROCEDURE: 1.  L5-S1 lumbar laminotomy and microdiskectomy on May 18, 1999.  DISCHARGE CONDITION:  Improving.  HOSPITAL COURSE:  The patient is a 45 year old individual who has had significant back and bilateral lower extremity pain, now worse on the left side.  He was admitted and underwent lumbar microdiskectomy and tolerated the procedure well. He was able to ambulate.  Postoperatively he had some difficulty with voiding but s taking oral pain medication.  At the current time his incision is clean and dry.  DISPOSITION:  He has been advised of his postoperative activities and he will be seen in the office in three weeks time.  His incision is clean and dry.  DISCHARGE MEDICATIONS: 1.  Given prescription for Percocet (#40 without refill). 2.  Valium 5 mg (#30 without refill). DD:  05/19/99 TD:  05/20/99 Job: [id number]

## 2010-08-21 NOTE — Op Note (Signed)
[name]. [facility]  Patient:    Dylan Owen, Dylan Owen                    MRN: [id number] Proc. Date: 10/11/00 Adm. Date:  [id number] Attending:  Jonne Ply                           Operative Report  PREOPERATIVE DIAGNOSIS:  L3-4 spondylosis and stenosis, L4-5 spondylosis and stenosis.  Status post arthrodesis L5-S1.  POSTOPERATIVE DIAGNOSIS:  L3-4 spondylosis and stenosis, L4-5 spondylosis and stenosis.  Status post arthrodesis L5-S1.  PROCEDURE:  L3 and L4 laminectomy, diskectomy L3-4 and L4-5 posterior interbody arthrodesis using T-lift bone spacers, posterior fixation L3 to L5 after removal of hardware L5 and S1.  Posterolateral arthrodesis with local autograft and allograft L3 to L5.  SURGEON:  Stefani Dama, M.D.  ASSISTANT:  Mena Goes. Franky Macho, M.D.  ANESTHESIA:  General endotracheal anesthesia.  INDICATIONS:  The patient is a 45 year old individual who has had significant back and bilateral lower extremity pain.  He has significant stenosis at the L3-4 level demonstrated myelographically.  He was taken to the operating room to undergo surgical decompression and stabilization procedure.  DESCRIPTION OF PROCEDURE:  The patient was brought to the operating room supine and on the stretcher.  After a smooth induction of general endotracheal anesthesia he was turned prone.  The back was shaved, prepped with Duraprep, and draped in a sterile fashion.  An elliptical incision was created around the previously made surgical scar.  This was taken down to the lumbodorsal fascia which was opened on either side of the midline.  In the lower aspects, hardware was identified and screwheads at the L5 and S1 level were identified. The screwheads were loosened, the rod was removed, and then the S1 screws were removed bilaterally.  The L5 screws were allowed to remain intact as these screws were going to be used for further arthrodesis from L3 to  L5. Laminectomy of L3 and L4 were then completed.  Laminectomy was performed in a partial fashion at the L3 level so as to leave a bridge of the lamina to secure the interspinous ligament at L2-3.  The remnant of L5 lamina was partially removed, but decompression was notably most stenotic at the L3-4 and the L4-5 spaces.  Once this area was decompressed, care was taken to scertain that the dura was well decompressed and the nerve roots at each level, that is L3, L4, and L5, were well decompressed.  On the right side there had been evidence of a previous laminotomy and the nerve root was somewhat scarred and adherent.  Because of this, it was not felt that a diskectomy on this side would be appropriate or proper.  Therefore on the left side, diskectomy was performed at L3-4 and L4-5.  Diskectomy was performed as totally as possible removing both of the largest portion of the annulus and the endplates from L4 and L5 and then L3 and L4 superiorly.  Once this was accomplished and the bony endplates were ground smooth, trial size spacing of the interbody implants was attempted and an 11 mm implant was able to be placed at L4-5.  This was tamped into the correct position followed by bony autograft which was harvested from the lamina and placed in front of and behind the graft.  At L3-4 similarly an 11 mm endplate could be placed and after soaking properly with saline  solution, the implant was impacted into the interspace and set in the proper position with again autograft being placed in front of and behind the bone graft.  Pedicle entry sites were then chosen at L3, L4, and L5.  These were visualized radiographically and once felt to be adequate, the holes were instrumented with 6.2 x 45 mm screws.  Screwcaps were then applied after reaming the bases adequately and an 85 mm rod was cut and formed into the proper position and then affixed into this position so as to allow for good maintenance of  the normal lordotic curvature in this area of the lumbar spine. Radiographic localization of the hardware was then obtained.  Grafts were noted to be well placed.  The transverse processes which were decordicated prior to placement of the screws were then packed with remnants of the autograft mixed with 10 cc of Vitoss allograft.  In the end, hemostasis from the soft tissues was obtained and the wound was copiously irrigated with antibiotic irrigating solution.  Care was taken to make sure that the L3, and L4, and L5 nerve roots were well decompressed on each side and no spinal fluid leaks had been encountered during the entire procedure.  The wound was then closed over a large Jackson-Pratt drain with #1 Vicryl in the lumbodorsal fascia and 2-0 Vicryl in the subcutaneous tissues and 3-0 Vicryl subcuticularly.  The patient tolerated the procedure well.  Estimated blood loss was estimated at 1000 cc.  300 cc of cellsaver blood were returned to the patient. DD:  10/11/00 TD:  10/11/00 Job: [id number]

## 2010-08-21 NOTE — Discharge Summary (Signed)
NAME:  Dylan Owen NO.:  [id number]   MEDICAL RECORD NO.:  [id number]                   PATIENT TYPE:  INP   LOCATION:  4731                                 FACILITY:  [facility]   PHYSICIAN:  Sherin Quarry, MD                   DATE OF BIRTH:  1965-04-16   DATE OF ADMISSION:  04/29/2002  DATE OF DISCHARGE:                                 DISCHARGE SUMMARY   HISTORY OF PRESENT ILLNESS:  Dylan Owen is a 45 year old man with a  history of chronic back pain and chronic pain treatment.  The patient  initially presented on 04/30/02 with a five-day of an illness characterized  by fever, sweats, cough, and sore throat, the cough has been productive of  yellowish phlegm.  He had been treated as an outpatient with a Combivent  inhaler but his symptoms have not improved.   PHYSICAL EXAM AT TIME OF ADMISSION:  As described by Dr. Nehemiah Settle.  VITAL SIGNS:  The patient's temperature was 100.8, his pulse was 140,  respirations were 24, O2 saturation was 94%.  GENERAL:  The patient was an acute ill-appearing man.  HEENT:  Within normal limits.  CHEST:  Bibasilar rales, right greater than left.  CARDIOVASCULAR:  Normal S1 and S2, without murmurs, rubs or gallops.  ABDOMEN:  Obese, normal bowel sounds, no masses, tenderness or organomegaly.  NEUROLOGIC AND EXTREMITIES:  Normal.   LABORATORY STUDIES OBTAINED:  The initial blood gas showed a pH of 7.33,  pCO2 51, pO2 was 120 on three liters.  Sodium 141, potassium 3.5.  Blood  cultures were negative.  Sputum cultures showed gram-positive cocci on Gram  stain.  Initial white count was 31,900, hemoglobin 14.5, hematocrit 43.  A  chest x-ray showed a confluent air space opacity at the right lung base  suggestive of pneumonia.  CT scan of the chest was obtained in light of the  patient's findings on chest x-ray, this showed no signs of pulmonary  embolus, patchy air space disease was noted consistent with pneumonia,  borderline mediastinal adenopathy was observed.   HOSPITAL COURSE:  On admission, Dr. Nehemiah Settle placed the patient on oxygen,  nebulizer treatments, he was given normal saline, Zosyn was started 3.75 g  IV every six hours.  The patient experienced significant symptomatic  improvement.  On 05/02/02, I evaluated the patient, he was having no  respiratory difficulty, he seemed quite agitated and indicated that he was  absolutely adamant that he was going to go home.  He felt that hospitals did  not agree with him and he knew people who had died in hospitals.  I  explained to him that in general it was important for people to stay in the  hospital when they had pneumonia until there were no signs of residual fever  or toxicity.  I urged him to stay in the hospital and continue intravenous  antibiotics.  He absolutely indicated he was going to go home.  I,  therefore, advised him that we would arrange for him to receive oral  antibiotics, I explained to him that oral antibiotics are less effective  than intravenous antibiotics.  I explained to him the importance of taking  antibiotics until they were all gone.  Therefore, on 05/02/02, the patient  was discharged.   DISCHARGE DIAGNOSES:  1. Pneumonia.  2. Chronic pain syndrome.  3. Chronic back pain.  4. Gastroesophageal reflux.   There is some residual uncertainty about exactly what pain medicines the  patient has been taking as an outpatient.  According to his description, he  is taking morphine 60 mg two tablets b.i.d., Neurontin 400 mg b.i.d.,  ibuprofen, Zantac, and Combivent two puffs four times a day.  In addition,  he was advised to take Avelox ABC pack x10 days.  Followup will be arranged  with [facility] in 7-10 days to assess his progress.   CONDITION ON DISCHARGE:  Good.                                                Sherin Quarry, MD    SY/MEDQ  D:  05/02/2002  T:  05/02/2002  Job:  [id number]   cc:   Orbie Pyo,  N.P.  [facility] L. Vear Clock, M.D.  [address]  [place]  [place] [address]  Fax: [phone]   Stefani Dama, M.D.  [address]  [place]  [place] [address]  Fax: [phone]

## 2010-08-21 NOTE — Procedures (Signed)
NAME:  Dylan Owen, Dylan Owen NO.:  [id number]   MEDICAL RECORD NO.:  [id number]          PATIENT TYPE:  OUT   LOCATION:  SLEEP CENTER                 FACILITY:  [facility]   PHYSICIAN:  Clinton D. Maple Hudson, M.D. DATE OF BIRTH:  [birth year]   DATE OF STUDY:  03/16/2005                              NOCTURNAL POLYSOMNOGRAM   REFERRING PHYSICIAN:  Dr. [name].   DATE OF STUDY:  March 16, 2005.   INDICATION FOR STUDY:  Hypersomnia with sleep apnea.   EPWORTH SLEEPINESS SCORE:  11/24.   BMI:  42.   WEIGHT:  340 pounds.   HOME MEDICATIONS:  Include morphine, Neurontin, Cymbalta, Valium and  bronchodilators.   SLEEP ARCHITECTURE:  Short total sleep time 243 minutes with sleep  efficiency 50%. Stage I was 20%, stage II 47%, stages III and IV 2%, REM 31%  of total sleep time. Sleep latency 41 minutes, REM latency 261 minutes,  awake after sleep onset 201 minutes, arousal index increased at 54.9.   RESPIRATORY DATA:  Apnea/hypopnea index (AHI, RDI) 30.1 obstructive events  per hour indicating moderate obstructive sleep apnea/hypopnea syndrome.  There were 18 central apneas, 38 obstructive apneas, 7 mixed apneas and 59  hypopneas. He slept only supine. REM AHI 74 per hour. He could not maintain  sleep sufficiently to meet C-PAP titration split protocol criteria on the  study night. He took morphine, Neurontin, Tylenol and Cymbalta at 4:15 a.m.  after returning from the bathroom.   OXYGEN DATA:  Mild snoring with oxygen desaturation to a nadir of 66%. Mean  oxygen saturation through the study was 89% on room air suggesting  underlying cardiopulmonary disease.   CARDIAC DATA:  Normal sinus rhythm.   MOVEMENT/PARASOMNIA:  Occasional leg jerk, insignificant.   IMPRESSION/RECOMMENDATIONS:  1.  Poor sleep quality with fragmentation and short total of total sleep      time despite a significant set of potentially sedating medications. Note      that several of these were  taken at 4:15 a.m. which is likely to      contribute to complaints of daytime sleepiness.  2.  Moderate obstructive sleep apnea/hypopnea syndrome, AHI 30.1 per hour      with mild snoring and oxygen desaturation to 66%.  3.  Consider return for C-PAP titration allowing time for better      establishment of sleep and adjustment to C-PAP. Otherwise evaluate for      alternative therapies as appropriate.      Clinton D. Maple Hudson, M.D.  Diplomate, Biomedical engineer of Sleep Medicine  Electronically Signed     CDY/MEDQ  D:  03/21/2005 17:08:05  T:  03/22/2005 00:47:40  Job:  [id number]

## 2010-08-21 NOTE — H&P (Signed)
NAME:  Dylan Owen, Dylan Owen NO.:  [id number]   MEDICAL RECORD NO.:  [id number]          PATIENT TYPE:  INP   LOCATION:  2010                         FACILITY:  [facility]   PHYSICIAN:  Noralyn Pick. Eden Emms, MD, FACCDATE OF BIRTH:  11-29-1965   DATE OF ADMISSION:  01/07/2006  DATE OF DISCHARGE:  01/08/2006                                HISTORY & PHYSICAL   Dylan Owen is a 45 year old patient admitted by the medical service for  chest pain.  He is morbidly obese.  He has chronic back problems and is not  too active to begin with.  He has had a large weight gain over the last two  years.  Coronary risk factors include family history for coronary disease,  type 2 diabetes.   The patient was walking around the track.  After about a lap and a quarter,  he developed some atypical substernal sharp chest pain.  It eventually  resolved on it's own when he went back to his [facility].  In the hospital, he was  ruled out for myocardial infarction.  Telemetry has not shown any  significant arrhythmias.  Chest x-ray was poor penetration with some  bronchitic changes but no CHF.   EKG was normal, enzymes were negative.   On talking to the patient, he has no documented history of coronary disease.  He did not get exertional chest pain in general.   His activity is somewhat limited due to chronic lower back problems.  He  sees Dr. Vear Clock in the [facility], and he sees the medical doctors here  at the [facility].   REVIEW OF SYSTEMS:  Remarkable for significant sleep apnea.  He wears oxygen  but cannot tolerate C-PAP.  He has not had any significant fevers, sputum  production, pleurisy.  He had some chronic lower extremity edema but no  history of DVT.   MEDICATIONS:  He does not recall his medications, suspect that he has been  started on medicine for type 2 diabetes and an aspirin, but I do not have  any of his other medications listed.   ALLERGIES:  NO KNOWN ALLERGIES.   PREVIOUS  SURGERY:  He has had previous back surgery by Dr. Jeannetta Nap.   EXAMINATION:  GENERAL:  He has multiple freckles on his skin which is warm  and dry.  He is morbidly obese.  His blood pressure is stable at 130/80.  VITAL SIGNS:  Pulse is 88 and regular.  NECK:  There is no thyromegaly, no lymphadenopathy.  LUNGS:  Clear.  HEART:  There is a S1 and S2 with distant heart tones.  ABDOMEN:  Benign.  There is no AAA.  There is no hepatosplenomegaly.  Distal  pulses are intact with trace edema.   EKG is normal.   LABS:  Essentially unremarkable with 3 negative sets of enzymes.   IMPRESSION:  Atypical chest pain in a morbidly obese individual with type 2  diabetes.  I do not think the patient needs to stay in the hospital over the  weekend.  He could be discharged on his usual medications and an aspirin  a  day.  Despite his sleep apnea, he is not actively wheezing, and I think that  he can probably walk on a treadmill for at least 3 minutes.   We will arrange an outpatient two-day protocol, Myoview study with exercise.   So long as this is not high risk, he can follow up with his medical doctors  at the [facility].           ______________________________  Noralyn Pick. Eden Emms, MD, [facility]     PCN/MEDQ  D:  01/08/2006  T:  01/08/2006  Job:  [id number]

## 2010-08-21 NOTE — Discharge Summary (Signed)
[place]  Patient:    Dylan Owen, Dylan Owen                      MRN: [id number] Adm. Date:  10/11/00 Disc. Date: 10/15/00 Attending:  Stefani Dama, M.D.                           Discharge Summary  ADMITTING DIAGNOSIS:  Lumbar spondylosis and stenosis at L3-L4 and L4-L5, status post arthrodesis at L5-S1.  DISCHARGE DIAGNOSIS:  Lumbar spondylosis and stenosis at L3-L4 and L4-L5, status post arthrodesis at L5-S1.  PROCEDURES: 1. Lumbar laminectomy L3-L4. 2. Diskectomy with posterior lumbar interbody fusion at L3-L4 and L4-L5. 3. Segmental pedicular fixation at L3-L5. 4. Local autograft and Allograft on October 11, 2000.  CONDITION ON DISCHARGE:  Improving.  HOSPITAL COURSE:  The patient is a 45 year old individual who has had significant back and bilateral leg pain.  He had a degenerated disc that had ruptured at L5-S1.  He had undergone previous diskectomy followed by arthrodesis.  He has had increasing problems and now has stenosis at the L3-L4 level.  The patient was advised regarding surgical intervention which was performed on October 11, 2000.  Postoperatively, the patient was able to get up and ambulate the following hospital day.  He had had some low-grade fevers which seemed to be resolving with use of incentive spirometry.  He is minimally motivated to ambulate independently and relies heavily on the help of his wife for activities of daily living.  Nonetheless, at this time, he appears to be improving steadily such that he can be discharged home.  DISCHARGE MEDICATIONS: 1. Percocet #60 without refills. 2. Ativan 1 mg #40 without refills. 3. Celebrex 200 mg #60 with refills. 4. Prescription for durable medical equipment including a shower chair,    wheelchair and elevated straight back chair have been written.  FOLLOWUP:  The patient will be seen in the office in three weeks time for further followup.  CONDITION ON DISCHARGE:  His  incision remains clean and dry.  His motor function has remained neurologically stable and intact at this time. DD:  10/14/00 TD:  10/15/00 Job: [id number]

## 2010-08-21 NOTE — Consult Note (Signed)
NAME:  Dylan Owen, Dylan Owen NO.:  [id number]   MEDICAL RECORD NO.:  [id number]          PATIENT TYPE:  INP   LOCATION:  2108                         FACILITY:  [facility]   PHYSICIAN:  Maree Krabbe, M.D.DATE OF BIRTH:  December 07, 1965   DATE OF CONSULTATION:  DATE OF DISCHARGE:                                 CONSULTATION   This is a renal consult.   REASON FOR CONSULTATION:  Elevated creatinine.   HISTORY OF PRESENT ILLNESS:  The patient is a 45 year old white male  with a history of chronic pain, multiple back surgeries, on large doses  of narcotics.  History of alcohol abuse, COPD, and diabetes.  He was  admitted on May 07, 2006, with a creatinine of 6, confused, altered  mental status.  Apparently he fell the day of admission, has been weak  and hurting since then.  He had a similar altered mental status when he  had pneumonia according to his family.  He was admitted to the ICU.  He  has not had any significant urine output, maybe 50 mL of urine.  The  patient is confused in the ICU and he has jerking-like asterixis, all of  this resolved with a dose of Narcan.  His BUN and creatinine were up  today to 37 and 7.4, potassium 5.5, bicarb 21.  Urinalysis, large blood  and moderate leukocytes.  Decrease in sodium 0.4%, hemoglobin 12, white  blood count 19,000.   PAST MEDICAL HISTORY:  As above.   MEDICATIONS:  1. Rocephin.  2. Protonix.  3. Zosyn.  4. Vancomycin.  5. Levophed, which he is currently off.   HOME MEDICATIONS:  Were:  1. Metformin.  2. Atenolol.  3. MSIR.  4. MS Contin.  5. Valium.  6. Neurontin.  7. Cymbalta.  8. Albuterol.  9. Spiriva.  10.Advair.  11.Lisinopril.  12.Hydrochlorothiazide.  13.Zantac.   SOCIAL HISTORY:  One tin a day of smokeless tobacco.  Not currently  drinking.  No cocaine or other drug use. Married.  On disability for his  back problems.   FAMILY HISTORY:  Mother is living, 77 year old, psych and hypertension.  Father living, 23 year old with coronary artery disease.   REVIEW OF SYSTEMS:  Not available currently.  The patient is  oversedated.   PHYSICAL EXAMINATION:  VITAL SIGNS:  Temperature 98.2, blood pressure  120/80, heart rate 120.  GENERAL:  The patient is a markedly obese white male in no distress.  He  is lethargic and arousable after Narcan.  He is fully oriented after  being treated with Narcan and his asterixis has resolved.  SKIN:  Without rash.  HEENT:  PERRL, EOMI.  THROAT:  Clear.  NECK:  Supple with flat neck veins.  CHEST:  Clear throughout.  CARDIAC:  Regular rate and rhythm without murmurs, rubs, or gallops.  ABDOMEN:  Soft, obese, nontender.  EXTREMITIES:  No peripheral edema.   LABORATORY DATA:  Sodium 139, potassium 5.5, otherwise as above.   Ultrasound, normal kidney's bilaterally.  Chest x-ray, normal lung  volumes, clear.   IMPRESSION:  1. Acute renal failure due to severe volume  depletion in association      with ACE inhibitor effect. The patient has asterixis, but this is      due to narcotic overdose and due to the uremia.  He cleared up      nicely with Narcan.  I would not dialyze him at this time.  With      fluid resuscitate and hopefully he will have recovery of renal      function.  I do not think there has been any permanent real damage      and he should recover from this episode as he had normal kidney      function within the last couple of months.  2. Leukocytosis, unclear cause.  It may have infection but I did not      see one clinically.  3. Altered mental status with asterixis due to narcotics at this      point. I would not expect to become uremic to a creatinine of 10 to      12.  4. History of diabetes mellitus type 2.  5. Mild chronic obstructive pulmonary disease.  6. Chronic back pain.   RECOMMENDATIONS:  1. Increase IV fluids.  2. Followup renal function and respiratory status closely with volume      resuscitation. Monitor CVP  and hopefully kidney function will      improve.  It not, he may require dialysis if he does become      clinically uremic or has another indication.      Maree Krabbe, M.D.  Electronically Signed     RDS/MEDQ  D:  05/08/2006  T:  05/09/2006  Job:  [id number]

## 2010-08-21 NOTE — H&P (Signed)
. [facility]  Patient:    Dylan Owen, Dylan Owen                  MRN: [id number] Adm. Date:  09/29/99 Attending:  Stefani Dama, M.D.                         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  HISTORY OF PRESENT ILLNESS:  Mr. Dylan Owen is a 45 year old right-handed individual who has been seen and treated in the office after having sustained a herniated nucleus pulposus at the L5-S1 level.  He had evidence of degenerative disk disease at other levels.  He was treated with surgical diskectomy on May 18, 1999 because of left lumbar radiculopathy.  The patient continued to have problems with pain and was noted to have a markedly degenerated disk space at the L5-S1 level on followup study.  After careful consideration of his options, he was advised regarding surgical decompression and stabilization with a posterior interbody technique at the L5-S1 level. The patient was also noted to have had a previous laminotomy and diskectomy at the L4-5 level some years ago.  He is now admitted to undergo repeat diskectomy followed by posterior interbody arthrodesis and stabilization of iliac crest bone graft from the left posterosuperior iliac crest.  PAST MEDICAL HISTORY:  The patients general health has been fair.  His initial back surgery was in 1990 and then surgery in the groin area was in 1996.  He takes no medication on a chronic basis, though recently he has been using some hydrocodone for pain.  He does not drink alcohol.  He has had weight gain and currently states his weight at about 260 pounds.  He is 6 feet 3 inches in height.  FAMILY HISTORY:  His mother is age 55 and has high blood pressure.  Father is age 1 and has heart disease.  Otherwise, no problems are noted.  SOCIAL HISTORY:  The patient is married.  He has small children and his wife is currently  pregnant.  REVIEW OF SYSTEMS:  Negative for any items other than those in the history of present illness.  PHYSICAL EXAMINATION:  GENERAL:  He is an alert and oriented individual in moderate distress with back pain.  NEUROLOGIC:  He tends to favor a 10-degree forward stoop.  He will not stand in the straight and erect position without exhibiting a marked amount of vertebral muscle spasm and tenderness to palpation in the lumbar spine.  Motor strength in the lower extremities reveals the iliopsoas, quad, tibialis, ______, and gastrocnemius are good to confrontation; however, both Achilles reflexes are absent.  There are 2+ patellae reflexes.  Straight leg raising is positive at 30 degrees on the right and on the left.  Patricks maneuver is negative bilaterally.  Sensation is intact to pin and light touch in the dorsal aspect of both distal lower extremities.  In the upper extremities, strength, reflexes, and sensation is normal.  Cranial nerve examination reveals his pupils are 4 mm, briskly reactive to light and accommodation.  The extraocular movements intact are full.  The face is symmetric to grimace. Tongue and uvula are in the midline.  Sclerae and conjunctivae are clear.  LUNGS:  Clear to auscultation.  HEART:  Regular rate and rhythm.  ABDOMEN:  Soft.  Bowel sounds positive.  No masses are palpable.  EXTREMITIES:  No cyanosis, clubbing, or  edema.  IMPRESSION:  The patient has evidence of a herniated nucleus pulposus at L5-S1 and it is felt to be recurrent.  He has failed extensive effort at conservative management and is now advised regarding surgical treatment for his back problem to include not only repeat diskectomy but posterior lumbar interbody arthrodesis using ______ bone spacers followed by pedicular fixation from L5 to the sacrum.  An iliac bone graft will be harvested. DD:  09/29/99 TD:  09/29/99 Job: [id number]

## 2010-08-21 NOTE — Op Note (Signed)
NAME:  Dylan Owen, Dylan Owen NO.:  [id number]   MEDICAL RECORD NO.:  [id number]                   PATIENT TYPE:  AMB   LOCATION:  DAY                                  FACILITY:  [facility]   PHYSICIAN:  Jamison Neighbor, M.D.               DATE OF BIRTH:  05/01/1965   DATE OF PROCEDURE:  03/12/2003  DATE OF DISCHARGE:                                 OPERATIVE REPORT   SERVICE:  Urology.   PREOPERATIVE DIAGNOSES:  1. Urethral stricture disease.  2. Undesired fertility.   POSTOPERATIVE DIAGNOSES:  1. Urethral stricture disease.  2. Undesired fertility.   PROCEDURE:  Cystoscopy, urethral dilation, visual internal urethrotomy,  bilateral vasectomy.   SURGEON:  Jamison Neighbor, M.D.   ANESTHESIA:  General.   COMPLICATIONS:  None.   DRAINS:  20 French Foley catheter.   BRIEF HISTORY:  This 45 year old male is known to have urethral stricture  disease.  The patient requires opening up the stricture.  In addition, the  patient would like to undergo simultaneous vasectomy.  He understands the  risks and benefits of both procedures including the potential risk for  spontaneous reversal of the vasectomy and he gave full and informed consent.   DESCRIPTION OF PROCEDURE:  After successful induction of general anesthesia,  the patient was placed in the dorsal lithotomy position, prepped with  Betadine and draped in the usual sterile fashion.  Cystoscopy was performed,  the urethra was visualized.  Down towards the bulb, there was a stricture  that was identified, the scope could not be passed beyond this point.  A  guidewire was negotiated through the stricture and up into the bladder.  The  urethra was then dilated, the 86 [place] with Goodwin sounds passed over the  wire.  The entire urethrotome was then inserted and the patient was fully  opened up eliminating all stricture disease. Care was taken to ensure that  there was no cutting anywhere near the sphincter.   The cystoscope was then  reintroduced and passed along its entire length.  The sphincter mechanism  was intact, the veru was intact, the prostatic foss was not obstructing and  the bladder neck was unremarkable.  The bladder itself was carefully  inspected and was free of any tumor or stones. Both ureteral orifices were  normal in configuration and location. Clear urine was seen to efflux from  each.  The guidewire was left in place in case it was difficult to pass a  catheter.  A 20 French Silastic catheter was easily passed into the bladder  along side the wire. The wire was then removed, the catheter was placed to  straight drainage. Attention was then directed to the vasectomy, the left  vas was brought to the midline and a sharpened hemostat was used to make a  puncture hole directly over the vas.  The vas grasping instrument was used  to grab  the vas, the vas was dissected away from the surrounding tissue with  a sharpened hemostat.  The vas was doubly clamped, the section of vas was  removed, each end was ligated, each end was cauterized.  Tissue  interposition was performed.  The two ends returned to the left hemiscrotum.  An identical procedure was performed on the opposite side through this same  puncture hole. A section of vas was removed, each end was cauterized, each  end was ligated, tissue interposition was performed.  A single stitch was  placed in the dartos layer, a single stitch was placed in the skin.  The  patient had some Dermabond applied.  He tolerated the procedure well and was  taken to the recovery room in good condition. The patient was given scrotal  support and ice. He will be given a prescription of Lorcet 10, Pyridium plus  and Septra.  He will take the Foley catheter out in several days but will go  home with a leg bag.                                               Jamison Neighbor, M.D.    RJE/MEDQ  D:  03/12/2003  T:  03/12/2003  Job:  [id number]

## 2010-08-21 NOTE — Assessment & Plan Note (Signed)
[facility]                            CARDIOLOGY OFFICE NOTE   LASALLE, ABEE                    MRN:          [id number]  DATE:03/17/2006                            DOB:          [birth year]    Mr. Oliger is seen today as a new consult per Dr. Landis Martins at [facility].  The patient is somewhat of a difficult historian.   He was seen in October by Dr. Landis Martins.   The patient is 45 years old.  He is obese.  He has a lower back problem  that has been keeping him on disability.   He is referred here for chest pain.  His chest pain is atypical but has  been fairly constant.  It seems chronic.  He has a chronic pain doctor  named Dr. Vear Clock who he sees.  A lot of his pain emanates around his  previous back problems.  He is actually on a very high dose of  MS  Contin 100 mg t.i.d.  He actually was complaining of chest pain with Dr.  Landis Martins.   The pain comes off and on and is not always exertional.  The patient  tries to walk some but is limited by his back more than any pain.  The  pain can radiate to both arms and up his neck.  There is no associated  diaphoresis.  He has some mild chronic dyspnea.   He has not had syncope.  He has chronic lower extremity edema.   He has never had a stress test or previous cardiac workup.   Coronary risk factors include positive family history in his father.  He  has not smoked cigarettes but has chewed [place] tobacco since the age of  80.   Diabetes.   He does have high blood pressure which is being treated with metoprolol.   REVIEW OF SYSTEMS:  Is remarkable for some intermittent hematuria and  chronic back pain.   Family History:  positive for diabetes on his mothers side.   MEDICATIONS:  Include:  1. Cymbalta 20 b.i.d.  2. MS Contin 100 t.i.d.  3. Diazepam 5 a day.  4. Neurontin 300 t.i.d.  5. Ranitidine 150 b.i.d.  6. Albuterol.  7. Advair.  8. Metformin 1 gram b.i.d.  9. Spiriva.  10.Lopressor 50 b.i.d.   NKDA   I do not have the recent hemoglobin A1C on the patient.   SOCIAL HISTORY:  Is remarkable for the patient being married.  His wife  has vasovagal syncope and he has disability and she is trying to get on  disability.  There is 3 young children at the house and 1 older stepson.   The patient really has no other hobbies.   EXAM:  He is overweight, he has multiple freckles.  Blood pressure is  128/78, pulse 82 and regular.  HEENT:  Is normal.  There is no thyromegaly.  No lymphadenopathy. No carotid bruits.  LUNGS:  Are clear with no wheezing.  There is an S1, S2 with distant heart sounds.  ABDOMEN:  Is protuberant, benign.  He has a previous lumbar  disc surgery.  Distal pulses are intact with trace edema.   His EKG was read from the computer as an acute inferior MI; however to  my eyes it is totally normal and there is no LVH, no previous infarction  and no hypertrophy.   I have some lab work that was drawn at [facility] which was dated  December 11th.  At that time his hematocrit was 36, reticular count was  1.5, D-dimer was 0.56.  His glucose was 141, creatinine was 0.9.  CPAs  were negative x3 as were troponins.   His LDL cholesterol is 102.   IMPRESSION:  Atypical chest pain in a patient with multiple coronary  risk factors including smokeless tobacco, diabetes and family history.  He has never had a workup before.  He cannot walk on a treadmill due to  his back problems.  We will order an adenosine Myoview.   He has a chronic pain syndrome and I suspect that his chest problems are  related to this.   The patient needs to stop the smokeless tobacco.  I offered to give him  a nicotine patch but he refused.   Further recommendations will be based on the results of his stress  Myoview.    Noralyn Pick. Eden Emms, MD, [facility]  Electronically Signed   PCN/MedQ  DD: 03/17/2006  DT: 03/18/2006  Job #: [id number]

## 2010-08-21 NOTE — Op Note (Signed)
[place]  Patient:    Dylan Owen, Dylan Owen                    MRN: [id number] Proc. Date: 09/29/99 Adm. 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  PROCEDURE:  Lumbar laminectomy, diskectomy, posterior interbody arthrodesis with bone plugs from the [employer] posterior lumbar interbody bone spacing technique, pedicular fixation L5-S1 and arthrodesis with iliac crest bone graft from the right posterior/superior iliac crest through a separate fascial incision.  SURGEON:  Stefani Dama, M.D.  ASSISTANT:  Alanson Aly. Roxan Hockey, M.D.  ANESTHESIA:  General endotracheal.  INDICATIONS:  The patient is a 45 year old individual, who has had two previous back operations, most recent in February 2001.  He has failed to gain significant relief of pain.  He complains of chronic discomfort.  He was advised regarding surgical decompression/stabilization after it was noted that he had evidence of recurrent disk herniation at the L5-S1 level that appeared to be somewhat larger than it had previously been.  PROCEDURE:  The patient was brought to the operating room supine on the stretcher.  After smooth induction of general endotracheal anesthesia, he was turned prone.  The back was shaved, prepped with Duraprep and draped in a sterile fashion.  An elliptical incision was made around his previous scar and this was excised.  Dissection was taken down to the lumbodorsal fascia, which was opened on either side of the midline.  The fascia was dissected down in a subperiosteal fashion to expose the interspace at L5-S1.  Lamina of L5 was identified positively with a radiograph.  Intralaminar space was then cleared on the left  side.  There was noted to be a significant amount of scar tissue on the right side.  There was noted to be fairly virgin tissue.  A laminectomy was then performed, removing the largest portion of lamina of L5 down to and including the pars regions.  The right side was carefully cleared.  Common dural tube was exposed, scar tissue encountered on the left side was dissected free.  The S1 nerve root was dissected free on that left side also.  A significant amount of traction of the dura from the scar tissue at the disk space at L5-S1. On the right side, the common dural tube was mobilized and retracted medially.  Here, the dorsally protruding disk was noted to be partially calcified.  This was incised with a 15 blade and then opened so as to remove a marked amount of remarkably degenerated disk material from within the disk space.  This allowed for good decompression of the disk space and the end plates were identified and then decorticated using a combination of curets and rongeurs and [employer] dissectors.  On the left side then, a diskectomy was performed after releasing all of the scar tissue and there was noted to be a moderate amount of subligamentous disk material present on that left side freeing up the S1 nerve root on the left side required some dissection of scar tissue using a microtechnique with loupe dissection. Once this was completed, the area was freed, interspace spreaders were placed first on the right side and the  left side was instrumented to a 9 mm size of interbody graft.  Once this was placed and tamped and countersunk, the opposite side was instrumented, again with a 9 mm graft.  Once these grafts were placed, care was taken to make sure that the L5 nerve root above had a smooth exit as did the S1 nerve roots below.  Pedicle screw entry sites were then chosen at the L5 vertebra and also at S1.   A 6 mm x 40 mm pedicle screws were then inserted after ascertaining  good positioning within the pedicle with radiographic confirmation.  Cutout was checked for with a palpatory technique and none was identified.  With the screws placed, heads were aggressively reamed so as to allow placement of the screw caps, which were then placed without difficulty.  A 75 mm rod was cut in half and used in a neutral construct to fix the screw heads.  Once these screw heads were fixed in position, bone graft was laid into the interspace.  This was cortical/cancellous bone graft that was harvested from the right posterior/superior iliac crest through a separate fascial incision.  That incision was closed separately also with #1 Vicryl in the fascia and #1 Vicryl in the subcutaneous tissues.  Bone graft was mainly cancellous was laid down first and then the cortical/cancellous strips were laid into the lateral gutters between the pedicle screws at L5 and S1 with a decorticated area around the facet to allow for bony fusion.  With this being completed, the area was copiously irrigated with antibiotic irrigating solution.  Patency of the nerve roots at L5 and S1 was again checked and was found to be free and clear.  Hemostasis from the soft tissues was achieved and then the lumbodorsal fascia was closed with #1 PDS in an interrupted fashion and a #1 Vicryl was used to supplement this and then a #1 Vicryl was used subcutaneously to close the fascia and 2-0 Vicryl was used subcuticularly along with some 3-0 Vicryl intermittently.  The patient tolerated the procedure well.  Blood loss was estimated at 750 cc.  One unit of cell-saver blood was returned to the patient. DD:  09/29/99 TD:  09/30/99 Job: [id number]

## 2010-08-25 ENCOUNTER — Telehealth: Payer: Self-pay | Admitting: Internal Medicine

## 2010-08-25 NOTE — Telephone Encounter (Signed)
Rx refill denied office visit needed. 

## 2010-08-25 NOTE — Telephone Encounter (Signed)
Refill- bystolic 10mg  tablet. Take 1 tablet every day. Qty 30. Last fill 4.26.12

## 2010-09-03 ENCOUNTER — Encounter: Payer: Self-pay | Admitting: Family

## 2010-09-04 ENCOUNTER — Ambulatory Visit (INDEPENDENT_AMBULATORY_CARE_PROVIDER_SITE_OTHER): Payer: Medicare PPO | Admitting: Family

## 2010-09-04 ENCOUNTER — Encounter: Payer: Self-pay | Admitting: Family

## 2010-09-04 ENCOUNTER — Other Ambulatory Visit: Payer: Self-pay | Admitting: Family

## 2010-09-04 VITALS — BP 140/86 | HR 78 | Temp 98.3°F | Resp 16 | Ht 72.99 in | Wt 326.1 lb

## 2010-09-04 DIAGNOSIS — E538 Deficiency of other specified B group vitamins: Secondary | ICD-10-CM

## 2010-09-04 DIAGNOSIS — R0989 Other specified symptoms and signs involving the circulatory and respiratory systems: Secondary | ICD-10-CM

## 2010-09-04 DIAGNOSIS — D518 Other vitamin B12 deficiency anemias: Secondary | ICD-10-CM

## 2010-09-04 DIAGNOSIS — E119 Type 2 diabetes mellitus without complications: Secondary | ICD-10-CM

## 2010-09-04 DIAGNOSIS — R300 Dysuria: Secondary | ICD-10-CM

## 2010-09-04 DIAGNOSIS — R3129 Other microscopic hematuria: Secondary | ICD-10-CM

## 2010-09-04 LAB — POCT URINALYSIS DIPSTICK
Bilirubin, UA: NEGATIVE
Glucose, UA: NEGATIVE
Ketones, UA: NEGATIVE
Nitrite, UA: NEGATIVE
Spec Grav, UA: 1.03
pH, UA: 5

## 2010-09-04 LAB — HEMOGLOBIN A1C: Hgb A1c MFr Bld: 6.3 % — ABNORMAL HIGH (ref <5.7)

## 2010-09-04 MED ORDER — CYANOCOBALAMIN 1000 MCG/ML IJ SOLN
1000.0000 ug | Freq: Once | INTRAMUSCULAR | Status: AC
  Administered 2010-09-04: 1000 ug via INTRAMUSCULAR

## 2010-09-04 MED ORDER — NYSTATIN 100000 UNIT/GM EX POWD: CUTANEOUS | Status: DC

## 2010-09-04 MED ORDER — GLIPIZIDE 10 MG PO TABS: 10.0000 mg | ORAL_TABLET | Freq: Two times a day (BID) | ORAL | Status: DC

## 2010-09-04 MED ORDER — METOPROLOL TARTRATE 50 MG PO TABS: 50.0000 mg | ORAL_TABLET | Freq: Every day | ORAL | Status: DC

## 2010-09-04 MED ORDER — METFORMIN HCL 1000 MG PO TABS: 1000.0000 mg | ORAL_TABLET | Freq: Two times a day (BID) | ORAL | Status: DC

## 2010-09-04 NOTE — Patient Instructions (Signed)
Will will arrange your nebulizer treatments through home health. Follow up with Dr. Artist Pais in 1 month.  Complete your lab work on the first floor.

## 2010-09-04 NOTE — Progress Notes (Signed)
Subjective:    Patient ID: Dylan Owen, male    DOB: February 02, 1966, 45 y.o.   MRN: [id number]  HPI  DM2- reports that his sugar has ben running in low 100's.  But notes that he will occasionally see a number in the 200's/300's.  He reports that he has not been using victoza.  He reports that he is only taking glipizide.  "stays thirsty."  Urinates often.  Has not had glipizide in a while.   Review of Systems See HPI  Past Medical History  Diagnosis Date  . COPD (chronic obstructive pulmonary disease)   . Diabetes mellitus     Type II  . Hypertension   . LBP (low back pain)   . Chronic kidney disease 2007    hx of renal insuficiency attributed to ACE-I use  . Stable angina     nl myoview 12/07  followed by Dr Eden Emms  . GERD (gastroesophageal reflux disease)   . Peripheral neuropathy   . OSA (obstructive sleep apnea)     on nighttime home O2 (refuses to wear CPAP because of claustrophobia)  . B12 deficiency     History   Social History  . Marital Status: Married    Spouse Name: N/A    Number of Children: N/A  . Years of Education: N/A   Occupational History  . Not on file.   Social History Main Topics  . Smoking status: Never Smoker   . Smokeless tobacco: Current User   Comment: dip  . Alcohol Use: Not on file  . Drug Use: Not on file  . Sexually Active: Not on file   Other Topics Concern  . Not on file   Social History Narrative   Regular exercise:  No    Past Surgical History  Procedure Date  . Spine surgery     lumbar laminectomy with ensuing arachnoiditis    Family History  Problem Relation Age of Onset  . Heart disease Other   . Hypertension Other   . Other Other     Emotional Illness    Allergies  Allergen Reactions  . Prednisone     REACTION: Anxiety    Current Outpatient Prescriptions on File Prior to Visit  Medication Sig Dispense Refill  . Cholecalciferol (DIALYVITE VITAMIN D 5000) 5000 UNITS capsule Take 5,000 Units by mouth  daily.        . cyanocobalamin (,VITAMIN B-12,) 1000 MCG/ML injection Inject 1,000 mcg into the muscle every 30 (thirty) days.        . diazepam (VALIUM) 5 MG tablet Take 5 mg by mouth every 6 (six) hours as needed. As needed for anxiety and muscle spasms       . Fluticasone-Salmeterol (ADVAIR DISKUS) 250-50 MCG/DOSE AEPB Inhale 1 puff into the lungs 2 (two) times daily.        Marland Kitchen glucose blood (ONE TOUCH TEST STRIPS) test strip 1 each by Other route as needed. Use as instructed to check blood sugar before breakfast, before you go to sleep, and if you develop symptoms of lethargy or confusion       . Insulin Pen Needle (PEN NEEDLES 31GX5/16") 31G X 8 MM MISC Use as directed once a day for medication injection       . morphine (MS CONTIN) 30 MG 12 hr tablet Take 30 mg by mouth every 8 (eight) hours.        . Multiple Vitamin (MULTIVITAMIN) tablet Take 1 tablet by mouth daily.        Marland Kitchen  nitroGLYCERIN (NITROSTAT) 0.4 MG SL tablet Place 0.4 mg under the tongue every 5 (five) minutes as needed.        . ranitidine (ZANTAC) 150 MG tablet Take 150 mg by mouth daily.        Marland Kitchen tiotropium (SPIRIVA) 18 MCG inhalation capsule Place 18 mcg into inhaler and inhale daily.          BP 140/86  Pulse 78  Temp(Src) 98.3 F (36.8 C) (Oral)  Resp 16  Ht 6' 0.99" (1.854 m)  Wt 326 lb 1.3 oz (147.909 kg)  BMI 43.03 kg/m2       Objective:   Physical Exam  Constitutional: He appears well-developed and well-nourished.  Cardiovascular: Normal rate and regular rhythm.   Pulmonary/Chest: Effort normal and breath sounds normal.  Psychiatric: He has a normal mood and affect. His behavior is normal.  ext: see diabetic foot exam.  Toes appear bluish bilaterally.  Pulses diminished bilateral lower extremities.         Assessment & Plan:

## 2010-09-05 LAB — BASIC METABOLIC PANEL WITH GFR
BUN: 14 mg/dL (ref 6–23)
Calcium: 10 mg/dL (ref 8.4–10.5)
GFR, Est African American: >60 mL/min (ref >60)
GFR, Est Non African American: >60 mL/min (ref >60)
Glucose, Bld: 136 mg/dL — ABNORMAL HIGH (ref 70–99)
Potassium: 4.3 mEq/L (ref 3.5–5.3)
Sodium: 139 mEq/L (ref 135–145)

## 2010-09-05 LAB — URINE CULTURE: Organism ID, Bacteria: NO GROWTH

## 2010-09-07 DIAGNOSIS — R0989 Other specified symptoms and signs involving the circulatory and respiratory systems: Secondary | ICD-10-CM | POA: Insufficient documentation

## 2010-09-07 NOTE — Assessment & Plan Note (Signed)
Resume monthly b12 injections- injection given in office today.

## 2010-09-07 NOTE — Assessment & Plan Note (Addendum)
Toes have a cyanotic hue.  Will order ABI.

## 2010-09-07 NOTE — Assessment & Plan Note (Signed)
Check A1C.  If elevated, consider resuming Victoza.

## 2010-09-11 ENCOUNTER — Other Ambulatory Visit: Payer: Self-pay | Admitting: *Deleted

## 2010-09-11 ENCOUNTER — Encounter (INDEPENDENT_AMBULATORY_CARE_PROVIDER_SITE_OTHER): Payer: Medicare PPO | Admitting: *Deleted

## 2010-09-11 DIAGNOSIS — E1159 Type 2 diabetes mellitus with other circulatory complications: Secondary | ICD-10-CM

## 2010-09-11 DIAGNOSIS — R0989 Other specified symptoms and signs involving the circulatory and respiratory systems: Secondary | ICD-10-CM

## 2010-09-15 ENCOUNTER — Encounter: Payer: Self-pay | Admitting: Family

## 2010-10-05 ENCOUNTER — Ambulatory Visit: Payer: Medicare PPO | Admitting: Family

## 2010-10-12 ENCOUNTER — Ambulatory Visit (INDEPENDENT_AMBULATORY_CARE_PROVIDER_SITE_OTHER): Payer: Medicare PPO | Admitting: Family

## 2010-10-12 ENCOUNTER — Encounter: Payer: Self-pay | Admitting: Family

## 2010-10-12 DIAGNOSIS — D518 Other vitamin B12 deficiency anemias: Secondary | ICD-10-CM

## 2010-10-12 DIAGNOSIS — E538 Deficiency of other specified B group vitamins: Secondary | ICD-10-CM

## 2010-10-12 DIAGNOSIS — J449 Chronic obstructive pulmonary disease, unspecified: Secondary | ICD-10-CM

## 2010-10-12 DIAGNOSIS — R319 Hematuria, unspecified: Secondary | ICD-10-CM

## 2010-10-12 DIAGNOSIS — J45909 Unspecified asthma, uncomplicated: Secondary | ICD-10-CM

## 2010-10-12 DIAGNOSIS — J4489 Other specified chronic obstructive pulmonary disease: Secondary | ICD-10-CM

## 2010-10-12 MED ORDER — IPRATROPIUM BROMIDE 0.02 % IN SOLN: 500.0000 ug | Freq: Four times a day (QID) | RESPIRATORY_TRACT | Status: DC

## 2010-10-12 MED ORDER — ALBUTEROL SULFATE (2.5 MG/3ML) 0.083% IN NEBU: 2.5000 mg | INHALATION_SOLUTION | Freq: Four times a day (QID) | RESPIRATORY_TRACT | Status: DC | PRN

## 2010-10-12 MED ORDER — CYANOCOBALAMIN 1000 MCG/ML IJ SOLN
1000.0000 ug | Freq: Once | INTRAMUSCULAR | Status: AC
  Administered 2010-10-12: 1000 ug via INTRAMUSCULAR

## 2010-10-12 NOTE — Patient Instructions (Signed)
Please follow up in September.  Call if your asthma symptoms worsen or if they do not improve with the use of the nebulizer.

## 2010-10-12 NOTE — Progress Notes (Signed)
Subjective:    Patient ID: Dylan Owen, male    DOB: 24-May-1965, 45 y.o.   MRN: [id number]  HPI  Dylan Owen is a 45 yr old male who presents today for his B12 injection and for follow up of his asthma.    Asthma- has been "rough" with the recent heat.  He is awaiting a nebulizer from [employer]. Notes that he becomes easily shortness of breath.  He reports that he is having trouble affording the spiriva and advair.     Review of Systems See HPI  Past Medical History  Diagnosis Date  . COPD (chronic obstructive pulmonary disease)   . Diabetes mellitus     Type II  . Hypertension   . LBP (low back pain)   . Chronic kidney disease 2007    hx of renal insuficiency attributed to ACE-I use  . Stable angina     nl myoview 12/07  followed by Dr Eden Emms  . GERD (gastroesophageal reflux disease)   . Peripheral neuropathy   . OSA (obstructive sleep apnea)     on nighttime home O2 (refuses to wear CPAP because of claustrophobia)  . B12 deficiency     History   Social History  . Marital Status: Married    Spouse Name: N/A    Number of Children: N/A  . Years of Education: N/A   Occupational History  . Not on file.   Social History Main Topics  . Smoking status: Never Smoker   . Smokeless tobacco: Current User   Comment: dip  . Alcohol Use: Not on file  . Drug Use: Not on file  . Sexually Active: Not on file   Other Topics Concern  . Not on file   Social History Narrative   Regular exercise:  No    Past Surgical History  Procedure Date  . Spine surgery     lumbar laminectomy with ensuing arachnoiditis    Family History  Problem Relation Age of Onset  . Heart disease Other   . Hypertension Other   . Other Other     Emotional Illness    Allergies  Allergen Reactions  . Prednisone     REACTION: Anxiety    Current Outpatient Prescriptions on File Prior to Visit  Medication Sig Dispense Refill  . Cholecalciferol (DIALYVITE VITAMIN D 5000) 5000  UNITS capsule Take 5,000 Units by mouth daily.        . cyanocobalamin (,VITAMIN B-12,) 1000 MCG/ML injection Inject 1,000 mcg into the muscle every 30 (thirty) days.        . diazepam (VALIUM) 5 MG tablet Take 5 mg by mouth every 6 (six) hours as needed. As needed for anxiety and muscle spasms       . fluticasone (FLONASE) 50 MCG/ACT nasal spray 2 SPRAYS EACH NOSTRIL ONCE DAILY  16 g  1  . Fluticasone-Salmeterol (ADVAIR DISKUS) 250-50 MCG/DOSE AEPB Inhale 1 puff into the lungs 2 (two) times daily.        Marland Kitchen glipiZIDE (GLUCOTROL) 10 MG tablet Take 1 tablet (10 mg total) by mouth 2 (two) times daily before a meal.  60 tablet  2  . glucose blood (ONE TOUCH TEST STRIPS) test strip 1 each by Other route as needed. Use as instructed to check blood sugar before breakfast, before you go to sleep, and if you develop symptoms of lethargy or confusion       . Insulin Pen Needle (PEN NEEDLES 31GX5/16") 31G X 8 MM MISC  Use as directed once a day for medication injection       . metFORMIN (GLUCOPHAGE) 1000 MG tablet Take 1 tablet (1,000 mg total) by mouth 2 (two) times daily with a meal.  60 tablet  2  . metoprolol (LOPRESSOR) 50 MG tablet Take 50 mg by mouth 2 (two) times daily.        Marland Kitchen morphine (MS CONTIN) 30 MG 12 hr tablet Take 30 mg by mouth every 8 (eight) hours.        . Multiple Vitamin (MULTIVITAMIN) tablet Take 1 tablet by mouth daily.        . nitroGLYCERIN (NITROSTAT) 0.4 MG SL tablet Place 0.4 mg under the tongue every 5 (five) minutes as needed.        . nystatin (MYCOSTATIN) powder Apply to affected area 3 times daily  30 g  2  . ranitidine (ZANTAC) 150 MG tablet Take 150 mg by mouth daily.        Marland Kitchen venlafaxine (EFFEXOR-XR) 37.5 MG 24 hr capsule Take 37.5 mg by mouth 2 (two) times daily.          BP 130/74  Pulse 78  Temp(Src) 98.4 F (36.9 C) (Oral)  Resp 16  Wt 328 lb 1.3 oz (148.816 kg)       Objective:   Physical Exam  Constitutional: He appears well-developed and well-nourished.    Cardiovascular: Normal rate and regular rhythm.   Pulmonary/Chest: Effort normal and breath sounds normal.  Psychiatric: He has a normal mood and affect. His behavior is normal. Judgment and thought content normal.          Assessment & Plan:

## 2010-10-13 ENCOUNTER — Encounter: Payer: Self-pay | Admitting: Family

## 2010-10-13 DIAGNOSIS — R319 Hematuria, unspecified: Secondary | ICD-10-CM

## 2010-10-13 LAB — POCT URINALYSIS DIPSTICK
Glucose, UA: NEGATIVE
Nitrite, UA: NEGATIVE
Urobilinogen, UA: 0.2

## 2010-10-15 NOTE — Assessment & Plan Note (Signed)
Will switch his spiriva to neb ipratroprium QID.  Will order neb machine from [facility].  Samples provided of Advair.

## 2010-10-15 NOTE — Assessment & Plan Note (Signed)
b12 injection today.  

## 2010-10-31 ENCOUNTER — Other Ambulatory Visit: Payer: Self-pay | Admitting: Family

## 2010-11-02 NOTE — Telephone Encounter (Signed)
Refill sent to pharmacy # 60 x 1 refill.

## 2010-11-10 ENCOUNTER — Ambulatory Visit (INDEPENDENT_AMBULATORY_CARE_PROVIDER_SITE_OTHER): Payer: Medicare PPO | Admitting: Family

## 2010-11-10 DIAGNOSIS — E538 Deficiency of other specified B group vitamins: Secondary | ICD-10-CM

## 2010-11-10 MED ORDER — CYANOCOBALAMIN 1000 MCG/ML IJ SOLN
1000.0000 ug | Freq: Once | INTRAMUSCULAR | Status: AC
  Administered 2010-11-10: 1000 ug via INTRAMUSCULAR

## 2010-11-26 ENCOUNTER — Other Ambulatory Visit: Payer: Self-pay | Admitting: *Deleted

## 2010-11-26 NOTE — Telephone Encounter (Signed)
Received fax from [facility] [employer] re: diabetic testing supplies. Left message for pt to return my call to verify that he is using this company for diabetic supplies.  Form completed 01/29/2010 for [employer].

## 2010-11-28 ENCOUNTER — Other Ambulatory Visit: Payer: Self-pay | Admitting: Family

## 2010-11-30 NOTE — Telephone Encounter (Signed)
Left message on machine to return my call. 

## 2010-11-30 NOTE — Telephone Encounter (Signed)
Pt returned your call.  

## 2010-12-01 ENCOUNTER — Other Ambulatory Visit: Payer: Self-pay | Admitting: Family

## 2010-12-01 NOTE — Telephone Encounter (Signed)
Verified with pt's wife, [name] that pt is currently using [facility] for his diabetic testing supplies. Form completed and forwarded to Provider for signature.

## 2010-12-02 ENCOUNTER — Other Ambulatory Visit: Payer: Self-pay | Admitting: Family

## 2010-12-02 NOTE — Telephone Encounter (Signed)
Completed form faxed to [phone] ([facility]) at 8:20am.

## 2010-12-05 ENCOUNTER — Other Ambulatory Visit: Payer: Self-pay | Admitting: Family

## 2010-12-11 ENCOUNTER — Ambulatory Visit: Payer: Medicare PPO

## 2010-12-14 ENCOUNTER — Ambulatory Visit (INDEPENDENT_AMBULATORY_CARE_PROVIDER_SITE_OTHER): Payer: Medicare PPO | Admitting: Family

## 2010-12-14 DIAGNOSIS — D518 Other vitamin B12 deficiency anemias: Secondary | ICD-10-CM

## 2010-12-14 MED ORDER — CYANOCOBALAMIN 1000 MCG/ML IJ SOLN
1000.0000 ug | Freq: Once | INTRAMUSCULAR | Status: AC
  Administered 2010-12-14: 1000 ug via INTRAMUSCULAR

## 2010-12-25 ENCOUNTER — Other Ambulatory Visit: Payer: Self-pay | Admitting: Family

## 2011-01-06 LAB — GLUCOSE, CAPILLARY: Glucose-Capillary: 150 — ABNORMAL HIGH

## 2011-01-07 ENCOUNTER — Ambulatory Visit (HOSPITAL_BASED_OUTPATIENT_CLINIC_OR_DEPARTMENT_OTHER)
Admission: RE | Admit: 2011-01-07 | Discharge: 2011-01-07 | Disposition: A | Discharge status: Home / Self Care | Payer: Medicare PPO | Source: Ambulatory Visit | Attending: Internal Medicine | Admitting: Internal Medicine

## 2011-01-07 ENCOUNTER — Ambulatory Visit (INDEPENDENT_AMBULATORY_CARE_PROVIDER_SITE_OTHER): Payer: Medicare PPO | Admitting: Internal Medicine

## 2011-01-07 ENCOUNTER — Encounter: Payer: Self-pay | Admitting: Internal Medicine

## 2011-01-07 VITALS — BP 132/72 | HR 113 | Temp 97.8°F | Resp 22 | Ht 72.0 in | Wt 332.0 lb

## 2011-01-07 DIAGNOSIS — J189 Pneumonia, unspecified organism: Secondary | ICD-10-CM | POA: Insufficient documentation

## 2011-01-07 DIAGNOSIS — R05 Cough: Secondary | ICD-10-CM

## 2011-01-07 DIAGNOSIS — R509 Fever, unspecified: Secondary | ICD-10-CM

## 2011-01-07 DIAGNOSIS — J449 Chronic obstructive pulmonary disease, unspecified: Secondary | ICD-10-CM

## 2011-01-07 DIAGNOSIS — R059 Cough, unspecified: Secondary | ICD-10-CM

## 2011-01-07 DIAGNOSIS — R062 Wheezing: Secondary | ICD-10-CM

## 2011-01-07 MED ORDER — AMOXICILLIN-POT CLAVULANATE 875-125 MG PO TABS: 1.0000 | ORAL_TABLET | Freq: Two times a day (BID) | ORAL | Status: DC

## 2011-01-07 NOTE — Assessment & Plan Note (Signed)
Given albuterol neb x one. Continue at home prn. Resume advair 250 one inhalation bid with mouth rinsing. Declines steroid tx. Present to ed with worsening wheezing or shortness of breath.

## 2011-01-07 NOTE — Assessment & Plan Note (Signed)
Bronchitis r/o pneumonia. Begin augmentin x 7days. Obtain cxr. Followup if no improvement or worsening.

## 2011-01-07 NOTE — Progress Notes (Signed)
  Subjective:    Patient ID: Dylan Owen, male    DOB: 1965-11-01, 45 y.o.   MRN: [id number]  HPI Pt presents to clinic for evaluation of cough. Notes over one week h/o cough, throat irritation, myalgias and head pressure. Now with recent 4 day h/o wheezing. Has h/o copd and cannot tolerate prednisone. Ran out of advair several weeks ago. Denies f/c. Taking mucinex with some improvement. No other exacerbating or alleviating factors. No other complaints.   Past Medical History  Diagnosis Date  . COPD (chronic obstructive pulmonary disease)   . Diabetes mellitus     Type II  . Hypertension   . LBP (low back pain)   . Chronic kidney disease 2007    hx of renal insuficiency attributed to ACE-I use  . Stable angina     nl myoview 12/07  followed by Dr Eden Emms  . GERD (gastroesophageal reflux disease)   . Peripheral neuropathy   . OSA (obstructive sleep apnea)     on nighttime home O2 (refuses to wear CPAP because of claustrophobia)  . B12 deficiency    Past Surgical History  Procedure Date  . Spine surgery     lumbar laminectomy with ensuing arachnoiditis    reports that he has never smoked. He uses smokeless tobacco. His alcohol and drug histories not on file. family history includes Heart disease in his other; Hypertension in his other; and Other in his other. Allergies  Allergen Reactions  . Prednisone     REACTION: Anxiety       Review of Systems see hpi     Objective:   Physical Exam  Nursing note and vitals reviewed. Constitutional: He appears well-developed and well-nourished. No distress.  HENT:  Head: Normocephalic and atraumatic.  Right Ear: Tympanic membrane, external ear and ear canal normal.  Left Ear: Tympanic membrane, external ear and ear canal normal.  Nose: Nose normal.  Mouth/Throat: Oropharynx is clear and moist. No oropharyngeal exudate.  Eyes: Conjunctivae are normal. Right eye exhibits no discharge. Left eye exhibits no discharge. No scleral  icterus.  Neck: Neck supple.  Cardiovascular: Normal rate, regular rhythm and normal heart sounds.  Exam reveals no gallop and no friction rub.   No murmur heard. Pulmonary/Chest: Effort normal. No respiratory distress. He has wheezes. He has no rales. He exhibits no tenderness.  Neurological: He is alert.  Skin: Skin is warm. He is not diaphoretic. No erythema.  Psychiatric: He has a normal mood and affect.          Assessment & Plan:

## 2011-01-11 ENCOUNTER — Encounter: Payer: Self-pay | Admitting: Internal Medicine

## 2011-01-11 ENCOUNTER — Ambulatory Visit (INDEPENDENT_AMBULATORY_CARE_PROVIDER_SITE_OTHER): Payer: Medicare PPO | Admitting: Internal Medicine

## 2011-01-11 VITALS — BP 102/80 | HR 80 | Temp 97.8°F | Resp 20 | Wt 332.0 lb

## 2011-01-11 DIAGNOSIS — R05 Cough: Secondary | ICD-10-CM

## 2011-01-11 MED ORDER — ALBUTEROL SULFATE (5 MG/ML) 0.5% IN NEBU: 2.5000 mg | INHALATION_SOLUTION | Freq: Once | RESPIRATORY_TRACT | Status: DC

## 2011-01-11 MED ORDER — AMOXICILLIN-POT CLAVULANATE 875-125 MG PO TABS: 1.0000 | ORAL_TABLET | Freq: Two times a day (BID) | ORAL | Status: AC

## 2011-01-11 MED ORDER — CEFTRIAXONE SODIUM 1 G IJ SOLR
1.0000 g | Freq: Once | INTRAMUSCULAR | Status: AC
  Administered 2011-01-11: 1 g via INTRAMUSCULAR

## 2011-01-11 NOTE — Progress Notes (Signed)
Addended by: Mervin Kung A on: 01/11/2011 08:12 AM   Modules accepted: Orders

## 2011-01-11 NOTE — Progress Notes (Signed)
  Subjective:    Patient ID: Dylan Owen, male    DOB: February 09, 1966, 45 y.o.   MRN: [id number]  HPI Pt presents to clinic for follow up of pneumonia. Reviewed cxr suggesting left perihilar/LLL infiltrate. Tolerating augmentin with only mild gi upset. Feels little better since beginning abx. Still has intermittent wheezing. States utd with pneumovax. No f/c. No exacerbating or alleviating factors. No other complaints.  Past Medical History  Diagnosis Date  . COPD (chronic obstructive pulmonary disease)   . Diabetes mellitus     Type II  . Hypertension   . LBP (low back pain)   . Chronic kidney disease 2007    hx of renal insuficiency attributed to ACE-I use  . Stable angina     nl myoview 12/07  followed by Dr Eden Emms  . GERD (gastroesophageal reflux disease)   . Peripheral neuropathy   . OSA (obstructive sleep apnea)     on nighttime home O2 (refuses to wear CPAP because of claustrophobia)  . B12 deficiency    Past Surgical History  Procedure Date  . Spine surgery     lumbar laminectomy with ensuing arachnoiditis    reports that he has never smoked. He uses smokeless tobacco. His alcohol and drug histories not on file. family history includes Heart disease in his other; Hypertension in his other; and Other in his other. Allergies  Allergen Reactions  . Prednisone     REACTION: Anxiety       Review of Systems  Constitutional: Negative for fever and chills.  HENT: Positive for congestion and rhinorrhea.   Respiratory: Positive for cough, shortness of breath and wheezing.   Skin: Negative for rash.       Objective:   Physical Exam  Nursing note and vitals reviewed. Constitutional: He appears well-developed and well-nourished. No distress.  HENT:  Head: Normocephalic and atraumatic.  Right Ear: External ear normal.  Left Ear: External ear normal.  Eyes: Conjunctivae are normal. No scleral icterus.  Neck: Neck supple.  Cardiovascular: Normal rate, regular rhythm  and normal heart sounds.  Exam reveals no gallop and no friction rub.   No murmur heard. Pulmonary/Chest: Effort normal. No accessory muscle usage. Not tachypneic. No respiratory distress. He has wheezes. He has no rhonchi. He has no rales.  Neurological: He is alert.  Skin: Skin is warm and dry. No rash noted. He is not diaphoretic.  Psychiatric: He has a normal mood and affect.          Assessment & Plan:

## 2011-01-11 NOTE — Assessment & Plan Note (Signed)
CXR suggestive of pneumonia. Extend augmentin for total course of 14 d. Given rocephin one gm im and observed for 20 minutes without rxn. Schedule close follow up in 1 wk or sooner if needed. Present to ED with worsening dyspnea/wheezing or fevers/chills.

## 2011-01-18 ENCOUNTER — Ambulatory Visit: Payer: Medicare PPO

## 2011-01-19 ENCOUNTER — Ambulatory Visit: Payer: Medicare PPO | Admitting: Internal Medicine

## 2011-02-21 ENCOUNTER — Other Ambulatory Visit: Payer: Self-pay | Admitting: Family

## 2011-02-22 ENCOUNTER — Other Ambulatory Visit: Payer: Self-pay | Admitting: Family

## 2011-03-11 ENCOUNTER — Telehealth: Payer: Self-pay | Admitting: Internal Medicine

## 2011-03-11 MED ORDER — GLIPIZIDE 10 MG PO TABS: 10.0000 mg | ORAL_TABLET | Freq: Two times a day (BID) | ORAL | Status: DC

## 2011-03-11 NOTE — Telephone Encounter (Signed)
Refill-glipizide 10mg  tablet. Take one tablet (10mg  total) by mouth two times daily before a meal. Qty 60 date written 9.4.12

## 2011-03-11 NOTE — Telephone Encounter (Signed)
Rx refill sent to pharmacy. Office visit needed. 

## 2011-05-10 ENCOUNTER — Telehealth: Payer: Self-pay | Admitting: Internal Medicine

## 2011-05-10 MED ORDER — GLIPIZIDE 10 MG PO TABS: 10.0000 mg | ORAL_TABLET | Freq: Two times a day (BID) | ORAL | Status: DC

## 2011-05-10 NOTE — Telephone Encounter (Signed)
Refill sent to pharmacy for glipizide. Note sent to pharmacy that appt will be needed for further refills. Please call pt to arrange follow up before current rx runs out.

## 2011-05-11 NOTE — Telephone Encounter (Signed)
Left a detailed message informing patient that a refill of glipizide has been sent to pharmacy but that he also needs to call back a make an appt.

## 2011-05-15 ENCOUNTER — Other Ambulatory Visit: Payer: Self-pay | Admitting: Family

## 2011-05-17 ENCOUNTER — Telehealth: Payer: Self-pay | Admitting: Internal Medicine

## 2011-05-17 MED ORDER — METFORMIN HCL 1000 MG PO TABS: 1000.0000 mg | ORAL_TABLET | Freq: Two times a day (BID) | ORAL | Status: DC

## 2011-05-17 NOTE — Telephone Encounter (Signed)
Rx refill sent to pharmacy. Patient due for office visit 

## 2011-06-22 ENCOUNTER — Encounter: Payer: Self-pay | Admitting: Internal Medicine

## 2011-06-22 ENCOUNTER — Ambulatory Visit (INDEPENDENT_AMBULATORY_CARE_PROVIDER_SITE_OTHER): Payer: Medicare Other | Admitting: Internal Medicine

## 2011-06-22 ENCOUNTER — Telehealth: Payer: Self-pay | Admitting: *Deleted

## 2011-06-22 VITALS — BP 126/80 | HR 80 | Temp 98.5°F | Resp 20 | Ht 72.0 in | Wt 344.0 lb

## 2011-06-22 DIAGNOSIS — J309 Allergic rhinitis, unspecified: Secondary | ICD-10-CM

## 2011-06-22 DIAGNOSIS — E559 Vitamin D deficiency, unspecified: Secondary | ICD-10-CM

## 2011-06-22 DIAGNOSIS — G4733 Obstructive sleep apnea (adult) (pediatric): Secondary | ICD-10-CM

## 2011-06-22 DIAGNOSIS — E119 Type 2 diabetes mellitus without complications: Secondary | ICD-10-CM

## 2011-06-22 DIAGNOSIS — D518 Other vitamin B12 deficiency anemias: Secondary | ICD-10-CM

## 2011-06-22 DIAGNOSIS — E538 Deficiency of other specified B group vitamins: Secondary | ICD-10-CM

## 2011-06-22 DIAGNOSIS — E785 Hyperlipidemia, unspecified: Secondary | ICD-10-CM

## 2011-06-22 DIAGNOSIS — I10 Essential (primary) hypertension: Secondary | ICD-10-CM

## 2011-06-22 LAB — CBC WITH DIFFERENTIAL/PLATELET
HCT: 43.9 % (ref 39.0–52.0)
Hemoglobin: 14 g/dL (ref 13.0–17.0)
Lymphs Abs: 2.3 10*3/uL (ref 0.7–4.0)
MCH: 27.9 pg (ref 26.0–34.0)
Monocytes Absolute: 1.1 10*3/uL — ABNORMAL HIGH (ref 0.1–1.0)
Monocytes Relative: 10 % (ref 3–12)
Neutro Abs: 7.6 10*3/uL (ref 1.7–7.7)
Neutrophils Relative %: 67 % (ref 43–77)
RBC: 5.01 MIL/uL (ref 4.22–5.81)

## 2011-06-22 LAB — HEPATIC FUNCTION PANEL
Albumin: 4.1 g/dL (ref 3.5–5.2)
Indirect Bilirubin: 0.3 mg/dL (ref 0.0–0.9)
Total Protein: 7.1 g/dL (ref 6.0–8.3)

## 2011-06-22 LAB — BASIC METABOLIC PANEL
BUN: 11 mg/dL (ref 6–23)
CO2: 23 mEq/L (ref 19–32)
Glucose, Bld: 406 mg/dL — ABNORMAL HIGH (ref 70–99)
Potassium: 4.3 mEq/L (ref 3.5–5.3)
Sodium: 135 mEq/L (ref 135–145)

## 2011-06-22 LAB — LIPID PANEL: Cholesterol: 169 mg/dL (ref 0–200)

## 2011-06-22 LAB — VITAMIN B12: Vitamin B-12: 399 pg/mL (ref 211–911)

## 2011-06-22 MED ORDER — GLIPIZIDE 10 MG PO TABS: 10.0000 mg | ORAL_TABLET | Freq: Two times a day (BID) | ORAL | Status: DC

## 2011-06-22 NOTE — Assessment & Plan Note (Signed)
No recent injxns. Obtain vitamin b12 level

## 2011-06-22 NOTE — Assessment & Plan Note (Signed)
Normotensive and stable. Continue current regimen. Monitor bp as outpt and followup in clinic as scheduled.  

## 2011-06-22 NOTE — Patient Instructions (Signed)
Please schedule chem7, a1c 250.0 prior to next visit 

## 2011-06-22 NOTE — Assessment & Plan Note (Signed)
Obtain vitamin d level 

## 2011-06-22 NOTE — Telephone Encounter (Signed)
Call placed to patient at [phone] for clarification on oxygen request; no answer, no voice mail.

## 2011-06-22 NOTE — Assessment & Plan Note (Signed)
Resume glipizide. Call fsbs log to clinic in ~2wks. Obtain cbc, chem7, a1c and urine microalbumin

## 2011-06-22 NOTE — Progress Notes (Signed)
  Subjective:    Patient ID: Dylan Owen, male    DOB: [birth year], 46 y.o.   MRN: [id number]  HPI Pt presents to clinic for followup of multiple medical problems. Ran out of glipizide ~6wks ago and hasn't followed up since October. Notes fsbs ~200 since out of medication. States insurance requiring him to change [facility] companies to [employer] for nocturnal oxygen for osa (states can't tolerate cpap.) previously took b12 injxns but not recently. Has continued AR sx's of nasal congestion and drainage. flonase was not very effective. Zyrtec has helped in the past.   Past Medical History  Diagnosis Date  . COPD (chronic obstructive pulmonary disease)   . Diabetes mellitus     Type II  . Hypertension   . LBP (low back pain)   . Chronic kidney disease 2007    hx of renal insuficiency attributed to ACE-I use  . Stable angina     nl myoview 12/07  followed by Dr Eden Emms  . GERD (gastroesophageal reflux disease)   . Peripheral neuropathy   . OSA (obstructive sleep apnea)     on nighttime home O2 (refuses to wear CPAP because of claustrophobia)  . B12 deficiency    Past Surgical History  Procedure Date  . Spine surgery     lumbar laminectomy with ensuing arachnoiditis    reports that he has never smoked. He uses smokeless tobacco. His alcohol and drug histories not on file. family history includes Heart disease in his other; Hypertension in his other; and Other in his other. Allergies  Allergen Reactions  . Prednisone     REACTION: Anxiety      Review of Systems see hpi     Objective:   Physical Exam  Physical Exam  Nursing note and vitals reviewed. Constitutional: Appears well-developed and well-nourished. No distress.  HENT:  Head: Normocephalic and atraumatic.  Right Ear: External ear normal.  Left Ear: External ear normal.  Eyes: Conjunctivae are normal. No scleral icterus.  Neck: Neck supple. Carotid bruit is not present.  Cardiovascular: Normal rate, regular rhythm and  normal heart sounds.  Exam reveals no gallop and no friction rub.   No murmur heard. Pulmonary/Chest: Effort normal and breath sounds normal. No respiratory distress. He has no wheezes. no rales.  Lymphadenopathy:    He has no cervical adenopathy.  Neurological:Alert.  Skin: Skin is warm and dry. Not diaphoretic.  Psychiatric: Has a normal mood and affect.         Assessment & Plan:

## 2011-06-22 NOTE — Assessment & Plan Note (Signed)
reattempt zyrtec. Sample of veramyst given to attempt.

## 2011-06-22 NOTE — Assessment & Plan Note (Signed)
Investigate need to change [facility] companies for nocturnal oxygen. States cannot tolerate cpap

## 2011-06-23 LAB — MICROALBUMIN / CREATININE URINE RATIO
Creatinine, Urine: 109.3 mg/dL
Microalb Creat Ratio: 72.6 mg/g — ABNORMAL HIGH (ref 0.0–30.0)

## 2011-06-23 LAB — VITAMIN D 25 HYDROXY (VIT D DEFICIENCY, FRACTURES): Vit D, 25-Hydroxy: 77 ng/mL (ref 30–89)

## 2011-06-24 NOTE — Telephone Encounter (Signed)
Call placed to patient at [phone], no answer, voice recording reached. A detailed voice message was left for patient to return phone call regarding oxygen request. Clarification is needed for what the patient is requesting.

## 2011-06-30 NOTE — Telephone Encounter (Signed)
No return call from patient.

## 2011-07-28 ENCOUNTER — Ambulatory Visit: Payer: Medicare Other | Admitting: Internal Medicine

## 2011-08-08 ENCOUNTER — Other Ambulatory Visit: Payer: Self-pay | Admitting: Internal Medicine

## 2011-08-12 ENCOUNTER — Encounter: Payer: Self-pay | Admitting: Internal Medicine

## 2011-08-12 ENCOUNTER — Ambulatory Visit (INDEPENDENT_AMBULATORY_CARE_PROVIDER_SITE_OTHER): Payer: Medicare Other | Admitting: Internal Medicine

## 2011-08-12 VITALS — BP 128/80 | HR 97 | Temp 98.1°F | Resp 20 | Ht 72.0 in | Wt 336.0 lb

## 2011-08-12 DIAGNOSIS — E119 Type 2 diabetes mellitus without complications: Secondary | ICD-10-CM

## 2011-08-12 DIAGNOSIS — E669 Obesity, unspecified: Secondary | ICD-10-CM

## 2011-08-12 MED ORDER — SITAGLIPTIN PHOSPHATE 100 MG PO TABS: 100.0000 mg | ORAL_TABLET | Freq: Every day | ORAL | Status: DC

## 2011-08-12 MED ORDER — NITROGLYCERIN 0.4 MG SL SUBL: 0.4000 mg | SUBLINGUAL_TABLET | SUBLINGUAL | Status: DC | PRN

## 2011-08-12 NOTE — Assessment & Plan Note (Signed)
Weight loss is crucial. States exercise is difficulty due to back pain. Encouraged dietary modification. Has attempted phentermine in the past and wishes to consider resumption. Recommend re-evaluate next visit as needs to demonstrate continued ability to lose some degree of wt without medication assistance.

## 2011-08-12 NOTE — Progress Notes (Signed)
  Subjective:    Patient ID: Dylan Owen, male    DOB: [birth year], 46 y.o.   MRN: [id number]  HPI Pt presents to clinic for followup of multiple medical problems. Reports fsbs range 200-376 without hypoglycemia. BP reviewed normotensive.   Past Medical History  Diagnosis Date  . COPD (chronic obstructive pulmonary disease)   . Diabetes mellitus     Type II  . Hypertension   . LBP (low back pain)   . Chronic kidney disease 2007    hx of renal insuficiency attributed to ACE-I use  . Stable angina     nl myoview 12/07  followed by Dr Eden Emms  . GERD (gastroesophageal reflux disease)   . Peripheral neuropathy   . OSA (obstructive sleep apnea)     on nighttime home O2 (refuses to wear CPAP because of claustrophobia)  . B12 deficiency    Past Surgical History  Procedure Date  . Spine surgery     lumbar laminectomy with ensuing arachnoiditis    reports that he has never smoked. He uses smokeless tobacco. His alcohol and drug histories not on file. family history includes Heart disease in his other; Hypertension in his other; and Other in his other. Allergies  Allergen Reactions  . Prednisone     REACTION: Anxiety      Review of Systems see hpi    Objective:   Physical Exam  Physical Exam  Nursing note and vitals reviewed. Constitutional: Appears well-developed and well-nourished. No distress.  HENT:  Head: Normocephalic and atraumatic.  Right Ear: External ear normal.  Left Ear: External ear normal.  Eyes: Conjunctivae are normal. No scleral icterus.  Neck: Neck supple. Carotid bruit is not present.  Cardiovascular: Normal rate, regular rhythm and normal heart sounds.  Exam reveals no gallop and no friction rub.   No murmur heard. Pulmonary/Chest: Effort normal and breath sounds normal. No respiratory distress. He has no wheezes. no rales.  Lymphadenopathy:    He has no cervical adenopathy.  Neurological:Alert.  Skin: Skin is warm and dry. Not diaphoretic.    Psychiatric: Has a normal mood and affect.        Assessment & Plan:

## 2011-08-12 NOTE — Assessment & Plan Note (Signed)
Poor control. Discussed different medication options. Add [place] 100mg  qd samples and prescription given. Encouraged wt loss. Consider lantus qhs if remains suboptimal

## 2011-09-10 ENCOUNTER — Ambulatory Visit (INDEPENDENT_AMBULATORY_CARE_PROVIDER_SITE_OTHER): Payer: Medicare Other | Admitting: Internal Medicine

## 2011-09-10 ENCOUNTER — Encounter: Payer: Self-pay | Admitting: Internal Medicine

## 2011-09-10 VITALS — BP 142/82 | HR 100 | Temp 98.3°F | Resp 20 | Wt 345.0 lb

## 2011-09-10 DIAGNOSIS — I10 Essential (primary) hypertension: Secondary | ICD-10-CM

## 2011-09-10 DIAGNOSIS — E119 Type 2 diabetes mellitus without complications: Secondary | ICD-10-CM

## 2011-09-10 DIAGNOSIS — E669 Obesity, unspecified: Secondary | ICD-10-CM

## 2011-09-10 LAB — BASIC METABOLIC PANEL
BUN: 13 mg/dL (ref 6–23)
Chloride: 97 mEq/L (ref 96–112)
Potassium: 4.1 mEq/L (ref 3.5–5.3)

## 2011-09-10 LAB — HEMOGLOBIN A1C: Mean Plasma Glucose: 235 mg/dL — ABNORMAL HIGH (ref <117)

## 2011-09-10 MED ORDER — PHENTERMINE HCL 37.5 MG PO CAPS: 37.5000 mg | ORAL_CAPSULE | ORAL | Status: DC

## 2011-09-10 MED ORDER — INSULIN GLARGINE 100 UNIT/ML ~~LOC~~ SOLN: 20.0000 [IU] | Freq: Every day | SUBCUTANEOUS | Status: DC

## 2011-09-10 NOTE — Patient Instructions (Signed)
Please increase your lantus dose 3 units every 3 days until your fasting morning sugar is less than 130

## 2011-09-10 NOTE — Progress Notes (Signed)
  Subjective:    Patient ID: Dylan Owen, male    DOB: 1966-01-22, 46 y.o.   MRN: [id number]  HPI Pt presents to clinic for followup of multiple medical problems. Stopped januvia samples after no improvement of fsbs. Reports 200-375 without hypoglycemia. Wt increased 9lbs since last visit.  Past Medical History  Diagnosis Date  . COPD (chronic obstructive pulmonary disease)   . Diabetes mellitus     Type II  . Hypertension   . LBP (low back pain)   . Chronic kidney disease 2007    hx of renal insuficiency attributed to ACE-I use  . Stable angina     nl myoview 12/07  followed by Dr Eden Emms  . GERD (gastroesophageal reflux disease)   . Peripheral neuropathy   . OSA (obstructive sleep apnea)     on nighttime home O2 (refuses to wear CPAP because of claustrophobia)  . B12 deficiency    Past Surgical History  Procedure Date  . Spine surgery     lumbar laminectomy with ensuing arachnoiditis    reports that he has never smoked. He uses smokeless tobacco. His alcohol and drug histories not on file. family history includes Heart disease in his other; Hypertension in his other; and Other in his other. Allergies  Allergen Reactions  . Prednisone     REACTION: Anxiety      Review of Systems see hpi     Objective:   Physical Exam  Physical Exam  Nursing note and vitals reviewed. Constitutional: Appears well-developed and well-nourished. No distress.  HENT:  Head: Normocephalic and atraumatic.  Right Ear: External ear normal.  Left Ear: External ear normal.  Eyes: Conjunctivae are normal. No scleral icterus.  Neck: Neck supple. Carotid bruit is not present.  Cardiovascular: Normal rate, regular rhythm and normal heart sounds.  Exam reveals no gallop and no friction rub.   No murmur heard. Pulmonary/Chest: Effort normal and breath sounds normal. No respiratory distress. He has no wheezes. no rales.  Lymphadenopathy:    He has no cervical adenopathy.  Neurological:Alert.   Skin: Skin is warm and dry. Not diaphoretic.  Psychiatric: Has a normal mood and affect.        Assessment & Plan:

## 2011-09-12 NOTE — Assessment & Plan Note (Signed)
Attempt short term phentermine with close bp monitoring. Understands definite need for wt loss

## 2011-09-12 NOTE — Assessment & Plan Note (Signed)
Poor control. Add lantus 20 units sq qhs. Increase dose 3 u every 3 days until am fasting fsbs <130. Follow up one month or sooner if needed.

## 2011-09-12 NOTE — Assessment & Plan Note (Signed)
Increase metoprolol 75mg  bid

## 2011-09-13 ENCOUNTER — Other Ambulatory Visit: Payer: Self-pay | Admitting: Internal Medicine

## 2011-09-13 NOTE — Telephone Encounter (Signed)
Rx refill sent to pharmacy. 

## 2011-09-20 ENCOUNTER — Ambulatory Visit: Payer: Medicare Other | Admitting: Internal Medicine

## 2011-10-01 ENCOUNTER — Ambulatory Visit (INDEPENDENT_AMBULATORY_CARE_PROVIDER_SITE_OTHER): Payer: Medicare Other | Admitting: Internal Medicine

## 2011-10-01 ENCOUNTER — Encounter: Payer: Self-pay | Admitting: Internal Medicine

## 2011-10-01 VITALS — BP 138/90 | HR 98 | Temp 98.6°F | Resp 22 | Wt 351.0 lb

## 2011-10-01 DIAGNOSIS — K5289 Other specified noninfective gastroenteritis and colitis: Secondary | ICD-10-CM

## 2011-10-01 DIAGNOSIS — R0600 Dyspnea, unspecified: Secondary | ICD-10-CM

## 2011-10-01 DIAGNOSIS — M7989 Other specified soft tissue disorders: Secondary | ICD-10-CM

## 2011-10-01 DIAGNOSIS — R0989 Other specified symptoms and signs involving the circulatory and respiratory systems: Secondary | ICD-10-CM

## 2011-10-01 DIAGNOSIS — R300 Dysuria: Secondary | ICD-10-CM

## 2011-10-01 DIAGNOSIS — K529 Noninfective gastroenteritis and colitis, unspecified: Secondary | ICD-10-CM

## 2011-10-01 DIAGNOSIS — E119 Type 2 diabetes mellitus without complications: Secondary | ICD-10-CM

## 2011-10-01 MED ORDER — PHENTERMINE HCL 37.5 MG PO CAPS: 37.5000 mg | ORAL_CAPSULE | ORAL | Status: DC

## 2011-10-01 MED ORDER — ONDANSETRON HCL 8 MG PO TABS: 8.0000 mg | ORAL_TABLET | Freq: Three times a day (TID) | ORAL | Status: AC | PRN

## 2011-10-01 MED ORDER — FUROSEMIDE 20 MG PO TABS: ORAL_TABLET | ORAL | Status: DC

## 2011-10-01 NOTE — Assessment & Plan Note (Signed)
Improving. Increase lantus 3 units q 3 days until fasting am glucose consistently less than 130 without hypoglycemia.

## 2011-10-01 NOTE — Assessment & Plan Note (Signed)
Obtain ua

## 2011-10-01 NOTE — Progress Notes (Signed)
  Subjective:    Patient ID: Dylan Owen, male    DOB: 01/06/1966, 46 y.o.   MRN: [id number]  HPI Pt presents to clinic for followup of multiple medical problems. Tolerating lantus but has not increased dose past 20 units. fsbs improved to low 200's without hypoglycemia. Wt continues to increase and notes swelling of lower extremities. His wife thinks he is mildly dyspneic on occasion. Has several day h/o nausea with emesis and diarrhea. Daughter with recent similar sx's thought to be gastroenteritis. Notes slight intermittent stinging after urination for several days. No other urinary sx's.   Past Medical History  Diagnosis Date  . COPD (chronic obstructive pulmonary disease)   . Diabetes mellitus     Type II  . Hypertension   . LBP (low back pain)   . Chronic kidney disease 2007    hx of renal insuficiency attributed to ACE-I use  . Stable angina     nl myoview 12/07  followed by Dr Eden Emms  . GERD (gastroesophageal reflux disease)   . Peripheral neuropathy   . OSA (obstructive sleep apnea)     on nighttime home O2 (refuses to wear CPAP because of claustrophobia)  . B12 deficiency    Past Surgical History  Procedure Date  . Spine surgery     lumbar laminectomy with ensuing arachnoiditis    reports that he has never smoked. He uses smokeless tobacco. His alcohol and drug histories not on file. family history includes Heart disease in his other; Hypertension in his other; and Other in his other. Allergies  Allergen Reactions  . Prednisone     REACTION: Anxiety      Review of Systems see hpi     Objective:   Physical Exam  Constitutional: He appears well-developed and well-nourished. No distress.  Neurological: He is alert.  Skin: He is not diaphoretic.  Psychiatric: He has a normal mood and affect.          Assessment & Plan:

## 2011-10-01 NOTE — Assessment & Plan Note (Signed)
Begin zofran prn. Followup if no improvement or worsening.

## 2011-10-01 NOTE — Patient Instructions (Signed)
Please schedule fasting labs prior to next visit Chem7, a1c-250.00 and lipid/lft-272.4 

## 2011-10-01 NOTE — Assessment & Plan Note (Signed)
Obtain BNP. Begin lasix prn after resolution of gi sx's.

## 2011-10-02 LAB — URINALYSIS, MICROSCOPIC ONLY
Casts: NONE SEEN
Crystals: NONE SEEN

## 2011-10-02 LAB — BRAIN NATRIURETIC PEPTIDE: Brain Natriuretic Peptide: 32.2 pg/mL (ref 0.0–100.0)

## 2011-10-02 LAB — URINALYSIS, ROUTINE W REFLEX MICROSCOPIC
Leukocytes, UA: NEGATIVE
Nitrite: NEGATIVE
Specific Gravity, Urine: 1.025 (ref 1.005–1.030)
pH: 5.5 (ref 5.0–8.0)

## 2011-10-04 ENCOUNTER — Telehealth: Payer: Self-pay | Admitting: *Deleted

## 2011-10-04 MED ORDER — SULFAMETHOXAZOLE-TRIMETHOPRIM 800-160 MG PO TABS: 1.0000 | ORAL_TABLET | Freq: Two times a day (BID) | ORAL | Status: DC

## 2011-10-04 NOTE — Telephone Encounter (Signed)
Message copied by Regis Bill on Mon Oct 04, 2011  8:46 AM ------      Message from: Mervin Kung A      Created: Mon Oct 04, 2011  8:42 AM                   ----- Message -----         From: Edwyna Perfect, MD         Sent: 10/03/2011   7:53 PM           To: Glendell Docker, CMA            Fluid test nl. UA may have infection. Not definite. Recommend abx course- septra ds bid x 7d if no interactions

## 2011-10-04 NOTE — Telephone Encounter (Signed)
Pt's wife informed; Rx sent to pharmacy, will call if any problems w/medication or no improvement/SLS

## 2011-10-09 ENCOUNTER — Emergency Department (HOSPITAL_BASED_OUTPATIENT_CLINIC_OR_DEPARTMENT_OTHER)
Admission: EM | Admit: 2011-10-09 | Discharge: 2011-10-09 | Disposition: A | Discharge status: Home / Self Care | Payer: Medicare Other | Attending: Emergency Medicine | Admitting: Emergency Medicine

## 2011-10-09 ENCOUNTER — Encounter (HOSPITAL_BASED_OUTPATIENT_CLINIC_OR_DEPARTMENT_OTHER): Payer: Self-pay | Admitting: *Deleted

## 2011-10-09 DIAGNOSIS — J449 Chronic obstructive pulmonary disease, unspecified: Secondary | ICD-10-CM | POA: Insufficient documentation

## 2011-10-09 DIAGNOSIS — T50995A Adverse effect of other drugs, medicaments and biological substances, initial encounter: Secondary | ICD-10-CM | POA: Insufficient documentation

## 2011-10-09 DIAGNOSIS — Z79899 Other long term (current) drug therapy: Secondary | ICD-10-CM | POA: Insufficient documentation

## 2011-10-09 DIAGNOSIS — G4733 Obstructive sleep apnea (adult) (pediatric): Secondary | ICD-10-CM | POA: Insufficient documentation

## 2011-10-09 DIAGNOSIS — J4489 Other specified chronic obstructive pulmonary disease: Secondary | ICD-10-CM | POA: Insufficient documentation

## 2011-10-09 DIAGNOSIS — E119 Type 2 diabetes mellitus without complications: Secondary | ICD-10-CM | POA: Insufficient documentation

## 2011-10-09 DIAGNOSIS — I10 Essential (primary) hypertension: Secondary | ICD-10-CM | POA: Insufficient documentation

## 2011-10-09 DIAGNOSIS — K219 Gastro-esophageal reflux disease without esophagitis: Secondary | ICD-10-CM | POA: Insufficient documentation

## 2011-10-09 DIAGNOSIS — Z794 Long term (current) use of insulin: Secondary | ICD-10-CM | POA: Insufficient documentation

## 2011-10-09 DIAGNOSIS — T7840XA Allergy, unspecified, initial encounter: Secondary | ICD-10-CM | POA: Insufficient documentation

## 2011-10-09 DIAGNOSIS — E538 Deficiency of other specified B group vitamins: Secondary | ICD-10-CM | POA: Insufficient documentation

## 2011-10-09 LAB — URINALYSIS, ROUTINE W REFLEX MICROSCOPIC
Bilirubin Urine: NEGATIVE
Hgb urine dipstick: NEGATIVE
Ketones, ur: NEGATIVE mg/dL
Nitrite: NEGATIVE
Protein, ur: NEGATIVE mg/dL
Urobilinogen, UA: 0.2 mg/dL (ref 0.0–1.0)

## 2011-10-09 LAB — CBC WITH DIFFERENTIAL/PLATELET
Basophils Absolute: 0 10*3/uL (ref 0.0–0.1)
Basophils Relative: 0 % (ref 0–1)
Eosinophils Absolute: 0.2 10*3/uL (ref 0.0–0.7)
Eosinophils Relative: 2 % (ref 0–5)
Hemoglobin: 13.4 g/dL (ref 13.0–17.0)
MCH: 28.5 pg (ref 26.0–34.0)
MCV: 84 fL (ref 78.0–100.0)
Metamyelocytes Relative: 0 %
Monocytes Absolute: 1.6 10*3/uL — ABNORMAL HIGH (ref 0.1–1.0)
Monocytes Relative: 18 % — ABNORMAL HIGH (ref 3–12)
Myelocytes: 0 %
Neutro Abs: 6.1 10*3/uL (ref 1.7–7.7)
Neutrophils Relative %: 68 % (ref 43–77)
Platelets: 337 10*3/uL (ref 150–400)
RBC: 4.7 MIL/uL (ref 4.22–5.81)
WBC: 9 10*3/uL (ref 4.0–10.5)
nRBC: 0 /100 WBC

## 2011-10-09 LAB — BASIC METABOLIC PANEL
BUN: 15 mg/dL (ref 6–23)
Calcium: 9.6 mg/dL (ref 8.4–10.5)
GFR calc Af Amer: >90 mL/min (ref >90)
GFR calc non Af Amer: >90 mL/min (ref >90)
Glucose, Bld: 354 mg/dL — ABNORMAL HIGH (ref 70–99)
Potassium: 4.6 mEq/L (ref 3.5–5.1)

## 2011-10-09 LAB — GLUCOSE, CAPILLARY

## 2011-10-09 MED ORDER — INSULIN REGULAR HUMAN 100 UNIT/ML IJ SOLN
15.0000 [IU] | Freq: Once | INTRAMUSCULAR | Status: AC
  Administered 2011-10-09: 15 [IU] via INTRAVENOUS

## 2011-10-09 MED ORDER — DIPHENHYDRAMINE HCL 50 MG/ML IJ SOLN
25.0000 mg | Freq: Once | INTRAMUSCULAR | Status: AC
  Administered 2011-10-09: 25 mg via INTRAVENOUS
  Filled 2011-10-09: qty 1

## 2011-10-09 MED ORDER — INSULIN REGULAR HUMAN 100 UNIT/ML IJ SOLN
INTRAMUSCULAR | Status: AC
  Administered 2011-10-09: 15 [IU] via INTRAVENOUS
  Filled 2011-10-09: qty 1

## 2011-10-09 NOTE — ED Notes (Signed)
Swelling x 1 week. Rash to body. Saw Dr. Tuesday. Given med. No better.

## 2011-10-09 NOTE — ED Notes (Signed)
The patient's CBG was 414.

## 2011-10-09 NOTE — ED Provider Notes (Addendum)
History   This chart was scribed for Gwyneth Sprout, MD by Sofie Rower. The patient was seen in room MH03/MH03 and the patient's care was started at 3:07 PM     CSN: [id number]  Arrival date & time 10/09/11  1323   First MD Initiated Contact with Patient 10/09/11 1457      Chief Complaint  Patient presents with  . Rash    (Consider location/radiation/quality/duration/timing/severity/associated sxs/prior treatment) Patient is a 46 y.o. male presenting with rash. The history is provided by the patient. No language interpreter was used.  Rash  This is a new problem. The current episode started more than 2 days ago. The problem has been gradually worsening. The problem is associated with an unknown (High blood sugar (pt visits with Dr. Lloyd Huger)) factor. There has been no fever. The rash is present on the right lower leg and left lower leg. The pain is moderate. The pain has been constant since onset. Associated symptoms include itching. The treatment provided no relief. Risk factors include a change in medications.   The pt informs the EDP that he has been experiencing swelling and recently experienced a rash located at his lower extremities. Modifying factors include taking antibiotics which do not provide relief. Pt has a hx of visiting with Dr. Rodena Medin, where which he was given lasix, antibiotics (June 28th, 2013), visiting Dr. Vear Clock (pain management on 10/06/11), COPD, diabetes, allergy to prednisone.  Pt recently had a pedicure performed.   PCP is Dr. Rodena Medin.    Past Medical History  Diagnosis Date  . COPD (chronic obstructive pulmonary disease)   . Diabetes mellitus     Type II  . Hypertension   . LBP (low back pain)   . Chronic kidney disease 2007    hx of renal insuficiency attributed to ACE-I use  . Stable angina     nl myoview 12/07  followed by Dr Eden Emms  . GERD (gastroesophageal reflux disease)   . Peripheral neuropathy   . OSA (obstructive sleep apnea)     on  nighttime home O2 (refuses to wear CPAP because of claustrophobia)  . B12 deficiency     Past Surgical History  Procedure Date  . Spine surgery     lumbar laminectomy with ensuing arachnoiditis    Family History  Problem Relation Age of Onset  . Heart disease Other   . Hypertension Other   . Other Other     Emotional Illness    History  Substance Use Topics  . Smoking status: Never Smoker   . Smokeless tobacco: Current User   Comment: dip  . Alcohol Use: No      Review of Systems  Skin: Positive for itching and rash.  All other systems reviewed and are negative.   10 Systems reviewed and all are negative for acute change except as noted in the HPI.   Allergies  Prednisone  Home Medications   Current Outpatient Rx  Name Route Sig Dispense Refill  . ALBUTEROL SULFATE (2.5 MG/3ML) 0.083% IN NEBU Nebulization Take 2.5 mg by nebulization every 6 (six) hours as needed for wheezing. 75 mL 12    [place] # [id number] Supervising Physician Dr. Thomos Lemons ...  . CHOLECALCIFEROL 5000 UNITS PO CAPS Oral Take 5,000 Units by mouth daily.      Marland Kitchen DIAZEPAM 5 MG PO TABS Oral Take 5 mg by mouth every 6 (six) hours as needed. As needed for anxiety and muscle spasms    . FLUTICASONE PROPIONATE 50  MCG/ACT NA SUSP  2 SPRAYS EACH NOSTRIL ONCE DAILY 16 g 1  . FLUTICASONE-SALMETEROL 250-50 MCG/DOSE IN AEPB Inhalation Inhale 1 puff into the lungs 2 (two) times daily.      . FUROSEMIDE 20 MG PO TABS  One by mouth in the morning as needed for swelling 30 tablet 0  . GLIPIZIDE 10 MG PO TABS Oral Take 1 tablet (10 mg total) by mouth 2 (two) times daily before a meal. 60 tablet 6  . GLUCOSE BLOOD VI STRP Other 1 each by Other route as needed. Use as instructed to check blood sugar before breakfast, before you go to sleep, and if you develop symptoms of lethargy or confusion     . INSULIN GLARGINE 100 UNIT/ML [place] SOLN Subcutaneous Inject 20 Units into the skin at bedtime. 5 pen PRN  . PEN NEEDLES 5/16"  31G X 8 MM MISC  Use as directed once a day for medication injection     . IPRATROPIUM BROMIDE 0.02 % IN SOLN Nebulization Take 2.5 mLs (500 mcg total) by nebulization 4 (four) times daily. 75 mL 12    [place] # [id number] Supervising Physician Dr. Thomos Lemons ...  . METFORMIN HCL 1000 MG PO TABS  TAKE 1 TABLET (1,000 MG TOTAL) BY MOUTH 2 (TWO) TIMES DAILY WITH A MEAL. 60 tablet 6  . METOPROLOL TARTRATE 50 MG PO TABS  TAKE 1 TABLET BY MOUTH TWICE A DAY 60 tablet 2  . MORPHINE SULFATE 30 MG PO TB12 Oral Take 30 mg by mouth every 8 (eight) hours.     Marland Kitchen ONE-DAILY MULTI VITAMINS PO TABS Oral Take 1 tablet by mouth daily.      Marland Kitchen NITROGLYCERIN 0.4 MG SL SUBL Sublingual Place 1 tablet (0.4 mg total) under the tongue every 5 (five) minutes as needed. 30 tablet 1  . ONDANSETRON HCL 8 MG PO TABS Oral Take 1 tablet (8 mg total) by mouth every 8 (eight) hours as needed for nausea. 20 tablet 0  . PHENTERMINE HCL 37.5 MG PO CAPS Oral Take 1 capsule (37.5 mg total) by mouth every morning. 30 capsule 1  . RANITIDINE HCL 150 MG PO TABS Oral Take 150 mg by mouth daily.      Marland Kitchen SITAGLIPTIN PHOSPHATE 100 MG PO TABS Oral Take 1 tablet (100 mg total) by mouth daily. 30 tablet 6  . SULFAMETHOXAZOLE-TRIMETHOPRIM 800-160 MG PO TABS Oral Take 1 tablet by mouth 2 (two) times daily. 14 tablet 0  . VENLAFAXINE HCL ER 37.5 MG PO CP24 Oral Take 75 mg by mouth. 75 mg po every morning and 150 mg daily at night      BP 226/64  Pulse 122  Temp 97.6 F (36.4 C) (Oral)  Resp 28  Ht 6\' 1"  (1.854 m)  Wt 350 lb (158.759 kg)  BMI 46.18 kg/m2  SpO2 97%  Physical Exam  Nursing note and vitals reviewed. Constitutional: He is oriented to person, place, and time. He appears well-developed and well-nourished.  HENT:  Head: Atraumatic.  Right Ear: External ear normal.  Left Ear: External ear normal.  Nose: Nose normal.  Mouth/Throat: Oropharynx is clear and moist.  Eyes: EOM are normal. Pupils are equal, round, and reactive to light.  Left conjunctiva is injected.  Neck: Normal range of motion.  Cardiovascular: Normal rate, regular rhythm and normal heart sounds.   No murmur heard.      2+ distal pulses.   Pulmonary/Chest: Effort normal and breath sounds normal. He has no wheezes.  Abdominal: Soft. He exhibits no distension. There is no tenderness. There is no rebound and no guarding.  Musculoskeletal: He exhibits edema (1 + edema above the ankle bilaterally). He exhibits no tenderness.       1+ edema in bilateral lower ext up to the ankle  Neurological: He is alert and oriented to person, place, and time.  Skin: Skin is warm. Rash (no mucosal involement. ) noted.       Blanching, macular, papular rash diffusely located at both lower extremities, with areas of excoriations.   Psychiatric: He has a normal mood and affect. His behavior is normal.    ED Course  Procedures (including critical care time)  DIAGNOSTIC STUDIES: Oxygen Saturation is 97% on room air, normal by my interpretation.    COORDINATION OF CARE:    3:20PM- EDP at bedside discusses treatment plan concerning application of steroids, ceasing antibiotics, administration of Benadryl, and urine sample.   Labs Reviewed  GLUCOSE, CAPILLARY - Abnormal; Notable for the following:    Glucose-Capillary 414 (*)     All other components within normal limits  CBC WITH DIFFERENTIAL - Abnormal; Notable for the following:    Monocytes Relative 18 (*)     Monocytes Absolute 1.6 (*)     All other components within normal limits  BASIC METABOLIC PANEL - Abnormal; Notable for the following:    Sodium 131 (*)     Chloride 94 (*)     Glucose, Bld 354 (*)     All other components within normal limits  URINALYSIS, ROUTINE W REFLEX MICROSCOPIC - Abnormal; Notable for the following:    Glucose, UA >1000 (*)     All other components within normal limits  URINE MICROSCOPIC-ADD ON - Abnormal; Notable for the following:    Squamous Epithelial / LPF FEW (*)      Bacteria, UA FEW (*)     All other components within normal limits  GLUCOSE, CAPILLARY - Abnormal; Notable for the following:    Glucose-Capillary 243 (*)     All other components within normal limits   No results found.   1. Allergic reaction caused by a drug       MDM   Patient with multiple medical problems including insulin-dependent diabetes and prior history of renal failure. Patient has had worsening swelling of the last one to 2 weeks for which he was placed on Lasix one week ago. At the same time patient also was started on Bactrim which she started taking on Saturday or Sunday for possible urinary tract infection. On Monday patient started to have small rash appearing on his lower extremities and arms that was pruritic. Since that time it has spread to involve his entire body is only worsening. Patient denies any airway involvement or rash in his mouth. The rash is most likely caused by the Bactrim he was placed on.  Patient also states that his blood sugars are running high in his 414 today. He is in no distress but was mildly tachycardic on arrival here after he had walked in the door. Patient cannot have steroids do to breathing difficulty and swelling. She was given IV Benadryl and blood sugar control. CBC, CMP, UA pending for further evaluation.  Spoke with the patient and his wife about stopping the Bactrim immediately.  Improvement with Benadryl. Normal labs other than hyperglycemia. After insulin blood sugar is improved to the 200s. Patient discharged home with Benadryl      I personally performed the services described  in this documentation, which was scribed in my presence.  The recorded information has been reviewed and considered.    Gwyneth Sprout, MD 10/09/11 1548  Gwyneth Sprout, MD 10/09/11 1734  Gwyneth Sprout, MD 10/09/11 1739

## 2011-10-11 ENCOUNTER — Ambulatory Visit (INDEPENDENT_AMBULATORY_CARE_PROVIDER_SITE_OTHER): Payer: Medicare Other | Admitting: Internal Medicine

## 2011-10-11 VITALS — BP 130/78 | HR 95 | Temp 98.6°F | Wt 347.0 lb

## 2011-10-11 DIAGNOSIS — E119 Type 2 diabetes mellitus without complications: Secondary | ICD-10-CM

## 2011-10-11 DIAGNOSIS — R21 Rash and other nonspecific skin eruption: Secondary | ICD-10-CM

## 2011-10-11 MED ORDER — GLIPIZIDE 10 MG PO TABS: ORAL_TABLET | ORAL | Status: DC

## 2011-10-16 ENCOUNTER — Encounter: Payer: Self-pay | Admitting: Internal Medicine

## 2011-10-16 DIAGNOSIS — R21 Rash and other nonspecific skin eruption: Secondary | ICD-10-CM | POA: Insufficient documentation

## 2011-10-16 NOTE — Assessment & Plan Note (Signed)
Improving am control. Increase glipizide 10mg  2 po in am and 1 po pm. Call clinic with fsbs log in ~2wks.

## 2011-10-16 NOTE — Assessment & Plan Note (Signed)
Improving. Felt to be secondary to septra. Avoid sulfa. Continue benadryl prn

## 2011-10-16 NOTE — Progress Notes (Signed)
  Subjective:    Patient ID: Dylan Owen, male    DOB: October 03, 1965, 46 y.o.   MRN: [id number]  HPI Pt presents to clinic for follow up of possible allergic rxn. Recently developed itchy rash bilateral legs back. No oral involvement or dyspnea. Seen in ED and felt to be possibly related to abx septra. Medication stopped. Taking benadryl and rash much better. Reports am fsbs 130-150-170 without hypoglycemia and pm fsbs 200's. No other complaints.  Past Medical History  Diagnosis Date  . COPD (chronic obstructive pulmonary disease)   . Diabetes mellitus     Type II  . Hypertension   . LBP (low back pain)   . Chronic kidney disease 2007    hx of renal insuficiency attributed to ACE-I use  . Stable angina     nl myoview 12/07  followed by Dr Eden Emms  . GERD (gastroesophageal reflux disease)   . Peripheral neuropathy   . OSA (obstructive sleep apnea)     on nighttime home O2 (refuses to wear CPAP because of claustrophobia)  . B12 deficiency    Past Surgical History  Procedure Date  . Spine surgery     lumbar laminectomy with ensuing arachnoiditis    reports that he has never smoked. He uses smokeless tobacco. He reports that he does not drink alcohol or use illicit drugs. family history includes Heart disease in his other; Hypertension in his other; and Other in his other. Allergies  Allergen Reactions  . Prednisone     REACTION: Anxiety  . Septra (Sulfamethoxazole-Tmp Ds)      Review of Systems see hpi     Objective:   Physical Exam  Nursing note and vitals reviewed. Constitutional: He appears well-developed and well-nourished. No distress.  HENT:  Head: Normocephalic and atraumatic.  Right Ear: External ear normal.  Left Ear: External ear normal.  Neurological: He is alert.  Skin: Skin is warm and dry. Rash noted. He is not diaphoretic.  Psychiatric: He has a normal mood and affect.          Assessment & Plan:

## 2011-11-01 ENCOUNTER — Other Ambulatory Visit: Payer: Self-pay | Admitting: Internal Medicine

## 2011-11-05 ENCOUNTER — Other Ambulatory Visit: Payer: Self-pay | Admitting: Internal Medicine

## 2011-11-05 NOTE — Telephone Encounter (Signed)
Patient instructed to F/U if no improvement or worsening of GI issues at 06.28.13 OV/SLS Please advise on Zofran request not listed in EMR at 07.08.13 OV.

## 2011-11-09 ENCOUNTER — Telehealth: Payer: Self-pay | Admitting: Internal Medicine

## 2011-11-09 MED ORDER — ONDANSETRON HCL 8 MG PO TABS: 8.0000 mg | ORAL_TABLET | Freq: Three times a day (TID) | ORAL | Status: AC | PRN

## 2011-11-09 NOTE — Telephone Encounter (Signed)
Just saw this. Ok 8mg  po tid prn n/v #30 if still needs

## 2011-11-09 NOTE — Telephone Encounter (Signed)
Rx done/SLS 

## 2011-11-09 NOTE — Telephone Encounter (Signed)
Ondansetron refill request; not on active med list [given at 06.28.13 OV #[id number]]/SLS Please advise.

## 2011-12-03 ENCOUNTER — Ambulatory Visit: Payer: Medicare Other | Admitting: Internal Medicine

## 2011-12-05 ENCOUNTER — Other Ambulatory Visit: Payer: Self-pay | Admitting: Internal Medicine

## 2011-12-07 ENCOUNTER — Telehealth: Payer: Self-pay | Admitting: Internal Medicine

## 2011-12-07 MED ORDER — PHENTERMINE HCL 37.5 MG PO CAPS: 37.5000 mg | ORAL_CAPSULE | ORAL | Status: DC

## 2011-12-07 NOTE — Telephone Encounter (Signed)
OK to give 30 tabs with zero refills.  

## 2011-12-07 NOTE — Telephone Encounter (Signed)
Rx done/SLS 

## 2011-12-07 NOTE — Telephone Encounter (Signed)
Refill- phentermine 37.5mg  tablet. Take one tablet by mouth every morning.

## 2011-12-07 NOTE — Telephone Encounter (Signed)
Done/SLS 

## 2011-12-14 ENCOUNTER — Ambulatory Visit: Payer: Medicare Other | Admitting: Internal Medicine

## 2011-12-14 DIAGNOSIS — Z0289 Encounter for other administrative examinations: Secondary | ICD-10-CM

## 2011-12-28 ENCOUNTER — Other Ambulatory Visit: Payer: Self-pay | Admitting: Internal Medicine

## 2011-12-28 NOTE — Telephone Encounter (Signed)
Done/SLS 

## 2012-01-09 ENCOUNTER — Other Ambulatory Visit: Payer: Self-pay | Admitting: Internal Medicine

## 2012-01-10 NOTE — Telephone Encounter (Signed)
Deny. Has used for several months and did not keep last appt

## 2012-02-08 ENCOUNTER — Ambulatory Visit (INDEPENDENT_AMBULATORY_CARE_PROVIDER_SITE_OTHER): Payer: Medicare Other | Admitting: Internal Medicine

## 2012-02-08 ENCOUNTER — Encounter: Payer: Self-pay | Admitting: Internal Medicine

## 2012-02-08 VITALS — BP 146/88 | HR 91 | Temp 98.0°F | Resp 22 | Wt 348.8 lb

## 2012-02-08 DIAGNOSIS — Z23 Encounter for immunization: Secondary | ICD-10-CM

## 2012-02-08 DIAGNOSIS — Z79899 Other long term (current) drug therapy: Secondary | ICD-10-CM

## 2012-02-08 DIAGNOSIS — E669 Obesity, unspecified: Secondary | ICD-10-CM

## 2012-02-08 DIAGNOSIS — E119 Type 2 diabetes mellitus without complications: Secondary | ICD-10-CM

## 2012-02-08 LAB — HEMOGLOBIN A1C: Hgb A1c MFr Bld: 7.8 % — ABNORMAL HIGH (ref <5.7)

## 2012-02-08 LAB — HEPATIC FUNCTION PANEL
ALT: 25 U/L (ref 0–53)
AST: 19 U/L (ref 0–37)
Alkaline Phosphatase: 65 U/L (ref 39–117)
Bilirubin, Direct: 0.1 mg/dL (ref 0.0–0.3)
Indirect Bilirubin: 0.2 mg/dL (ref 0.0–0.9)
Total Bilirubin: 0.3 mg/dL (ref 0.3–1.2)

## 2012-02-08 LAB — BASIC METABOLIC PANEL
BUN: 10 mg/dL (ref 6–23)
CO2: 32 mEq/L (ref 19–32)
Chloride: 97 mEq/L (ref 96–112)
Potassium: 4.7 mEq/L (ref 3.5–5.3)

## 2012-02-08 MED ORDER — INSULIN GLARGINE 100 UNIT/ML ~~LOC~~ SOLN: 65.0000 [IU] | Freq: Every day | SUBCUTANEOUS | Status: DC

## 2012-02-08 NOTE — Progress Notes (Signed)
  Subjective:    Patient ID: Dylan Owen, male    DOB: January 05, 1966, 46 y.o.   MRN: [id number]  HPI Pt presents to clinic for followup of multiple medical problems. States is able lose weight with phentermine however upon stopping medication regains the weight entirely. Adjusted his Lantus up to 65 units and had resulting blood sugars in the 110-120 range without hypoglycemia. Began to run low on Lantus refills and decreased the amount. Blood sugars have returned to elevated. Has chronic back pain which is exacerbated by his obesity. Finds it difficult to exercise due to his weight.  Past Medical History  Diagnosis Date  . COPD (chronic obstructive pulmonary disease)   . Diabetes mellitus     Type II  . Hypertension   . LBP (low back pain)   . Chronic kidney disease 2007    hx of renal insuficiency attributed to ACE-I use  . Stable angina     nl myoview 12/07  followed by Dr Eden Emms  . GERD (gastroesophageal reflux disease)   . Peripheral neuropathy   . OSA (obstructive sleep apnea)     on nighttime home O2 (refuses to wear CPAP because of claustrophobia)  . B12 deficiency    Past Surgical History  Procedure Date  . Spine surgery     lumbar laminectomy with ensuing arachnoiditis    reports that he has never smoked. He uses smokeless tobacco. He reports that he does not drink alcohol or use illicit drugs. family history includes Heart disease in his other; Hypertension in his other; and Other in his other. Allergies  Allergen Reactions  . Prednisone     REACTION: Anxiety  . Septra (Sulfamethoxazole-Tmp Ds)       Review of Systems see hpi     Objective:   Physical Exam  Nursing note and vitals reviewed. Constitutional: He appears well-developed and well-nourished. No distress.  HENT:  Head: Normocephalic and atraumatic.  Right Ear: External ear normal.  Left Ear: External ear normal.  Eyes: Conjunctivae normal are normal.  Neurological: He is alert.  Skin: He is not  diaphoretic.  Psychiatric: He has a normal mood and affect.          Assessment & Plan:

## 2012-02-08 NOTE — Assessment & Plan Note (Signed)
Improving control. Refill Lantus 65 units each bedtime. Reviewed maximum dosing of glipizide. Ultimately requires weight loss. Obtain Chem-7, A1c and urine microalbumin.

## 2012-02-08 NOTE — Assessment & Plan Note (Signed)
Do not recommend further phentermine. Proceed with bariatric surgery consult.

## 2012-02-08 NOTE — Patient Instructions (Signed)
Please schedule fasting labs prior to next visit Chem7, a1c-250.00 and lipid-272.4

## 2012-03-01 ENCOUNTER — Other Ambulatory Visit: Payer: Self-pay | Admitting: Internal Medicine

## 2012-03-01 NOTE — Telephone Encounter (Signed)
Rx to pharmacy/SLS 

## 2012-04-24 NOTE — Progress Notes (Signed)
  Subjective:    Patient ID: Dylan Owen, male    DOB: [birth year], 47 y.o.   MRN: [id number]  HPI no show appt    Review of Systems     Objective:   Physical Exam        Assessment & Plan:

## 2012-05-04 ENCOUNTER — Other Ambulatory Visit: Payer: Self-pay | Admitting: Internal Medicine

## 2012-05-11 ENCOUNTER — Telehealth: Payer: Self-pay

## 2012-05-11 NOTE — Telephone Encounter (Signed)
Insurance company sent paperwork suggesting pt start a medication to protect his kidneys from the DM. MD is suggesting (if pt is willing) to start Lisinopril 5 mg qd (Disp # 30 with 3 refills). I left a message for patient to return my call.

## 2012-05-15 NOTE — Telephone Encounter (Signed)
Left a detailed message and asked for patient to return my call

## 2012-05-24 MED ORDER — LISINOPRIL 5 MG PO TABS: 5.0000 mg | ORAL_TABLET | Freq: Every day | ORAL | Status: DC

## 2012-05-24 NOTE — Telephone Encounter (Signed)
Patient informed and states we can send this medication into the pharmacy

## 2012-05-26 ENCOUNTER — Other Ambulatory Visit: Payer: Self-pay | Admitting: Internal Medicine

## 2012-05-29 NOTE — Telephone Encounter (Signed)
eScribe request for refill on GLIPIZIDE Last filled - 10/11/11, #90 X 6 Last seen on - 02/08/12 Refill sent per [facility] refill protocol.

## 2012-06-07 ENCOUNTER — Other Ambulatory Visit: Payer: Self-pay | Admitting: Internal Medicine

## 2012-06-07 NOTE — Telephone Encounter (Signed)
Metformin 30 day supply sent to pharmacy. Pt was due for follow up in February. Please call pt to arrange.

## 2012-06-12 IMAGING — CR DG CHEST 2V
2 series · 2 of 2 positions shown · non-contrast
Comparison: Portable chest x-ray of 05/14/2006

CLINICAL DATA: Cough, fever, wheezing

CHEST - 2 VIEW

[w chest pa]
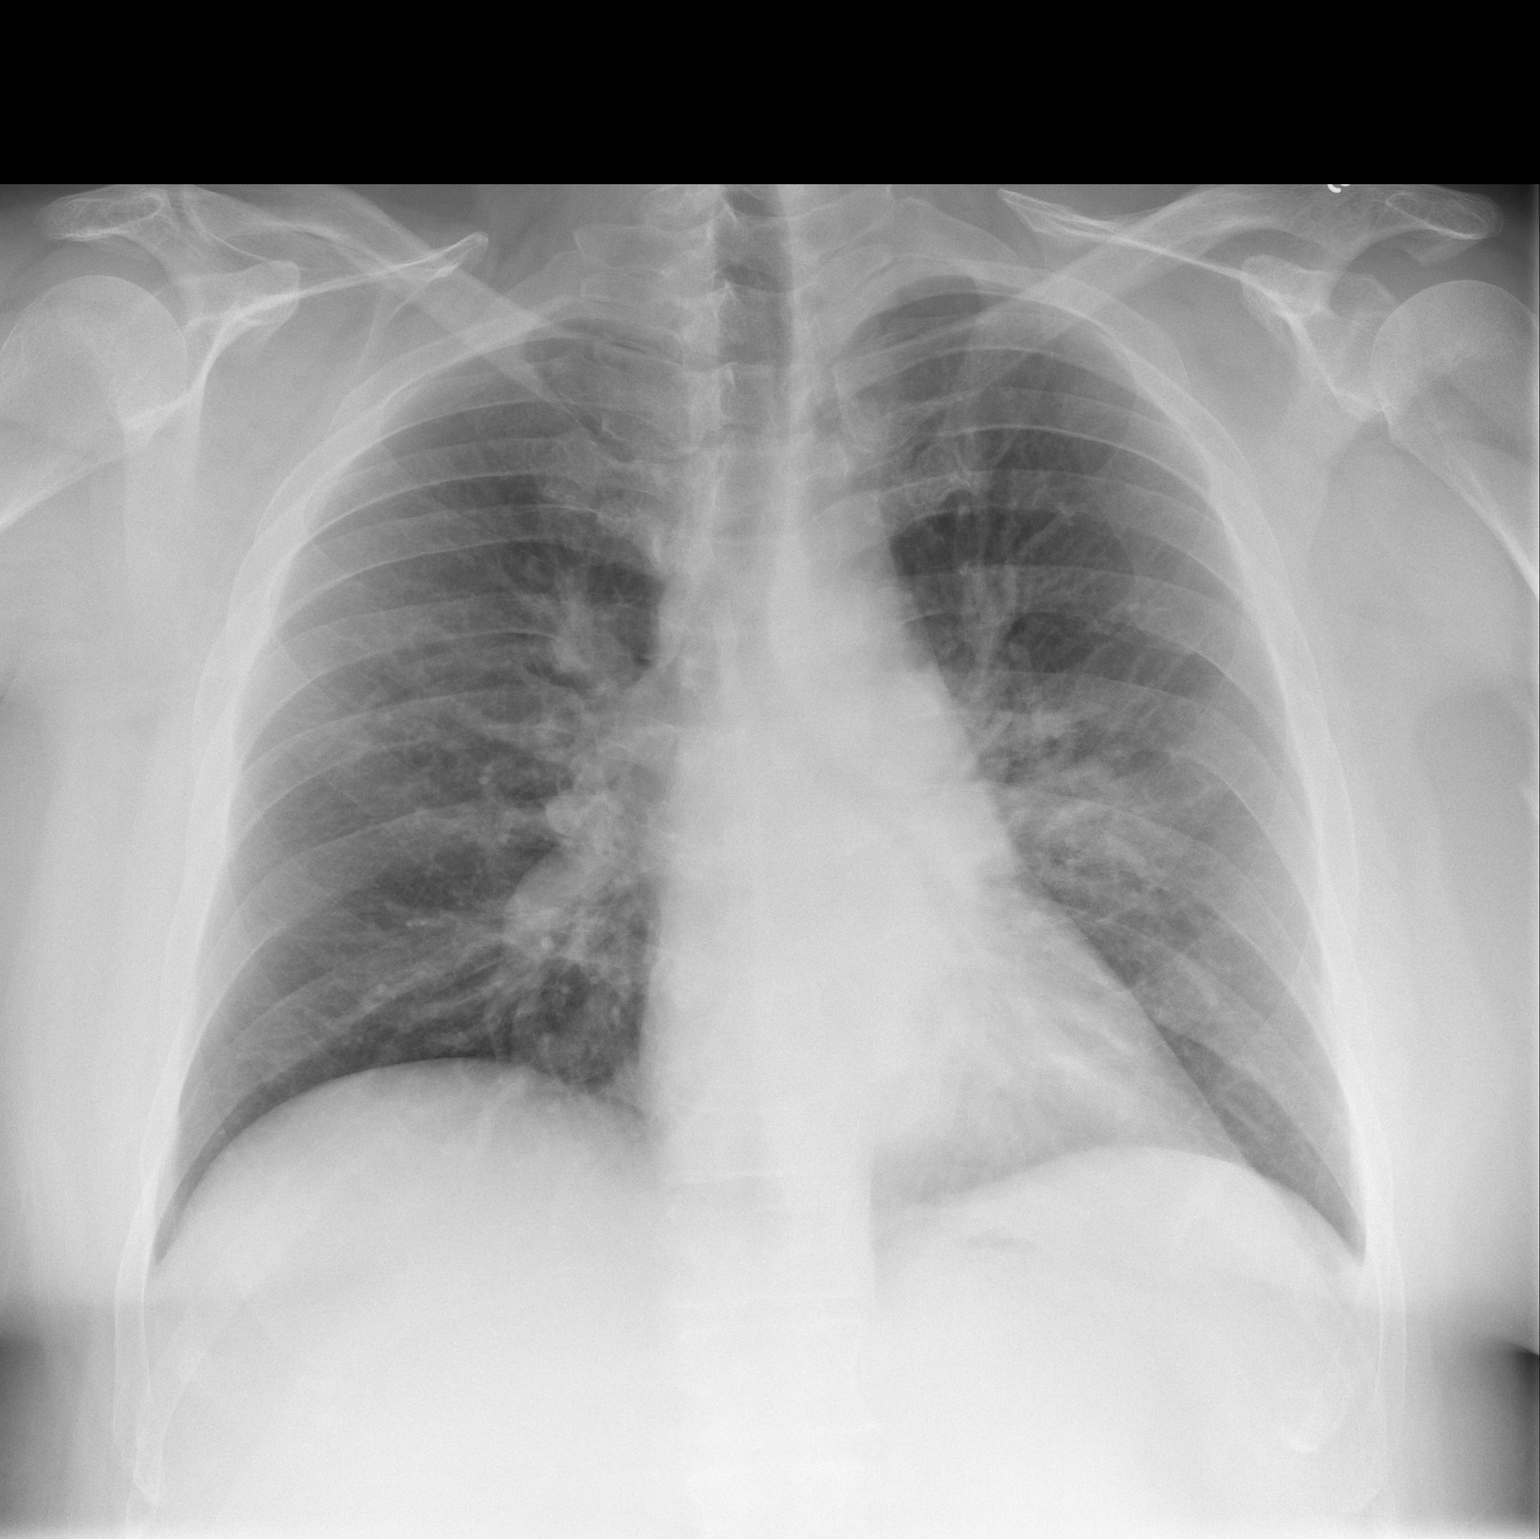

[w chest lat]
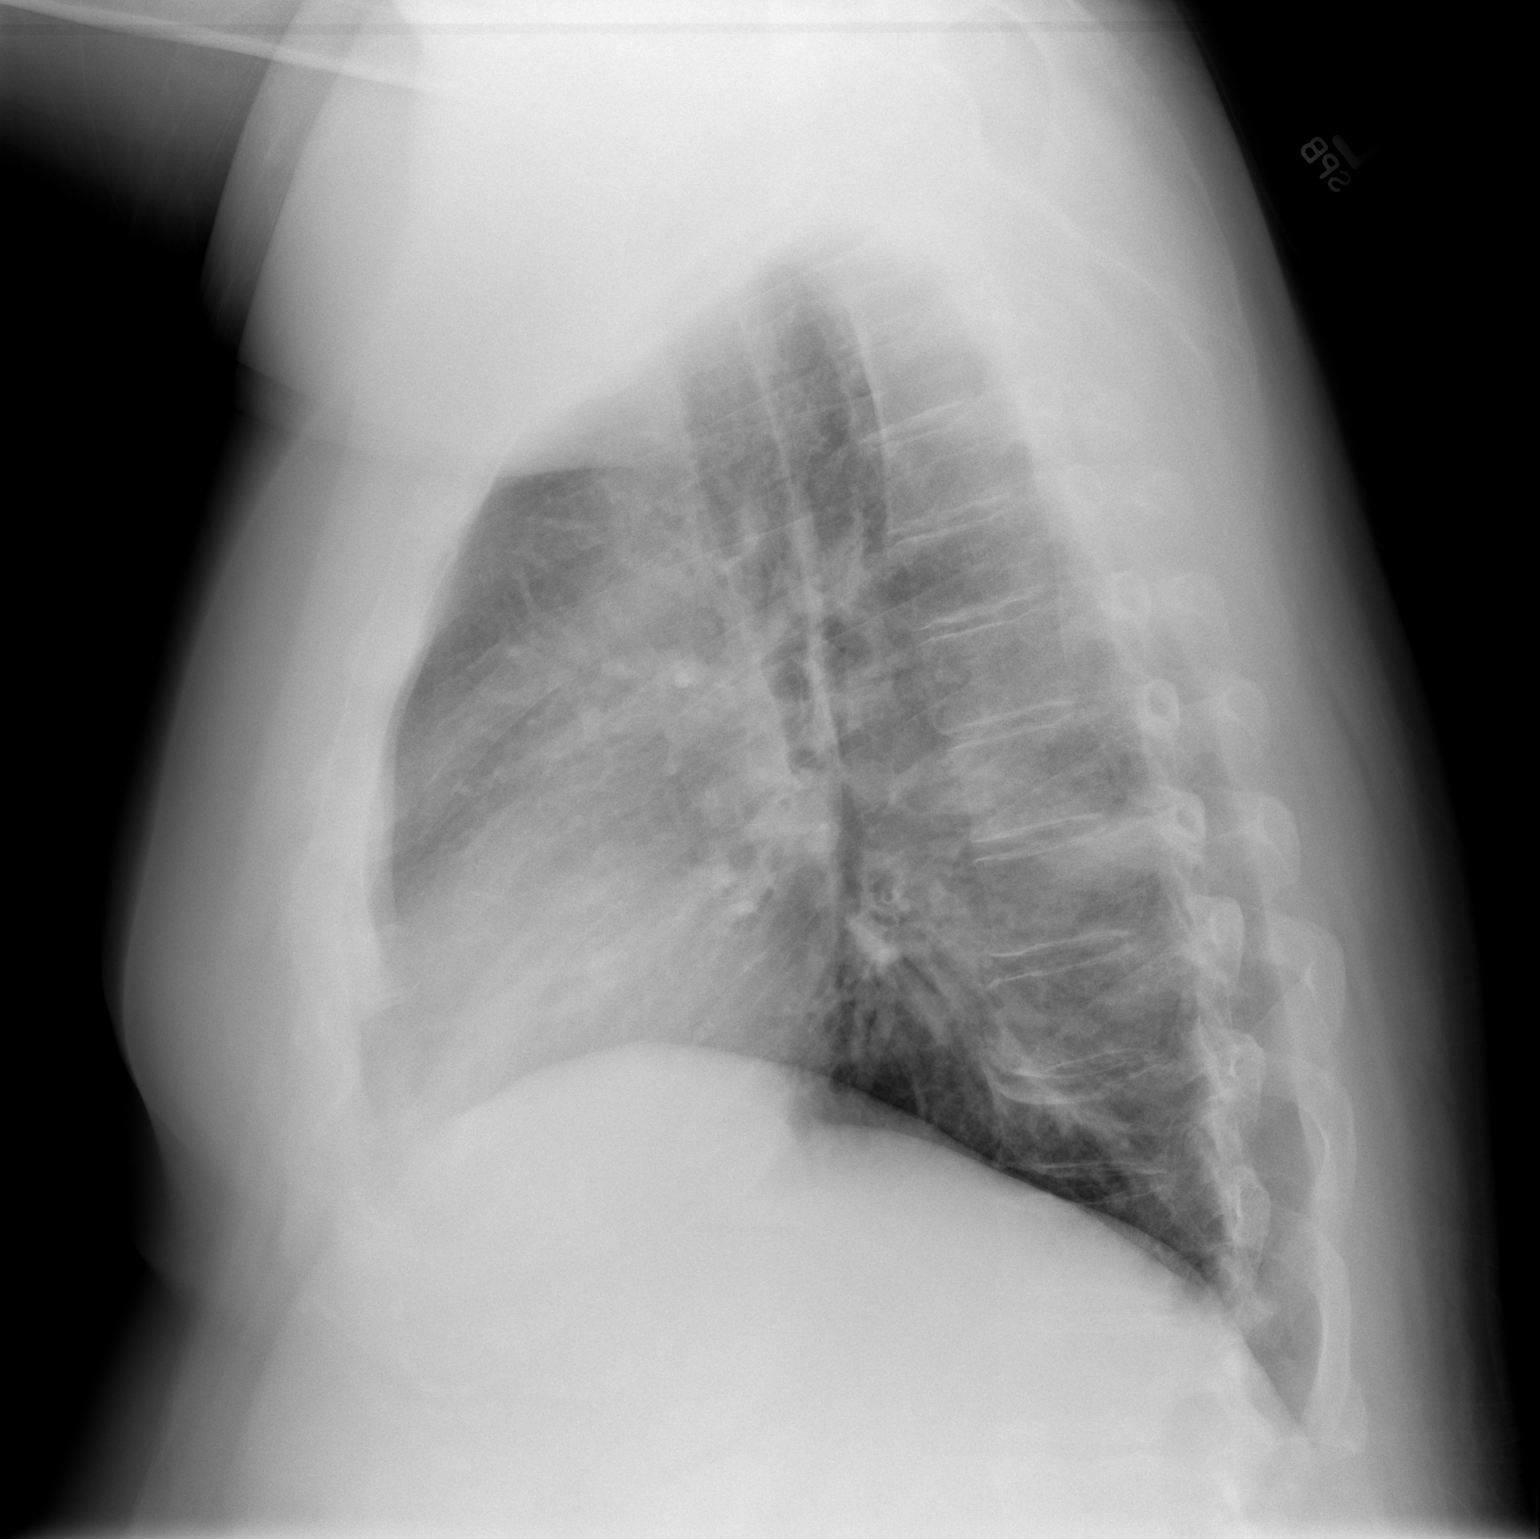

[2 of 2 positions shown; findings below may reference images not displayed]

FINDINGS: There are changes of bronchitis.  In addition, there is
haziness overlying the left hilum and a left lower lobe pneumonia
is suspected.  No effusion is seen.  The right lung is clear.  The
heart is within upper limits of normal.  No bony abnormality is
seen.  An old compression of a lower thoracic vertebral body is
present.
IMPRESSION: Suspect left perihilar - left lower lobe pneumonia.  Also changes
of bronchitis.

## 2012-06-13 NOTE — Telephone Encounter (Signed)
Mailbox full, could not leave message.

## 2012-06-15 NOTE — Telephone Encounter (Signed)
Mailed letter to pt to call and scheduled appt.

## 2012-06-15 NOTE — Telephone Encounter (Signed)
Letter mailed to pt to call and schedule appt.  

## 2012-07-12 ENCOUNTER — Other Ambulatory Visit: Payer: Self-pay | Admitting: Family Medicine

## 2012-07-20 ENCOUNTER — Ambulatory Visit: Payer: Medicare Other | Admitting: Family Medicine

## 2012-08-07 ENCOUNTER — Telehealth: Payer: Self-pay | Admitting: Internal Medicine

## 2012-08-07 MED ORDER — METFORMIN HCL 1000 MG PO TABS: 500.0000 mg | ORAL_TABLET | Freq: Two times a day (BID) | ORAL | Status: DC

## 2012-08-07 NOTE — Telephone Encounter (Signed)
Refill- metformin hcl 1000mg  tablet. Take one tablet by mouth twice da day with a meal. Qty 60 last fill 4.10.14

## 2012-08-10 ENCOUNTER — Encounter: Payer: Self-pay | Admitting: Family Medicine

## 2012-08-10 ENCOUNTER — Ambulatory Visit (INDEPENDENT_AMBULATORY_CARE_PROVIDER_SITE_OTHER): Payer: Medicare Other | Admitting: Family Medicine

## 2012-08-10 VITALS — BP 124/90 | HR 96 | Temp 98.4°F | Ht 72.0 in | Wt 354.1 lb

## 2012-08-10 DIAGNOSIS — G4733 Obstructive sleep apnea (adult) (pediatric): Secondary | ICD-10-CM

## 2012-08-10 DIAGNOSIS — E669 Obesity, unspecified: Secondary | ICD-10-CM

## 2012-08-10 DIAGNOSIS — I10 Essential (primary) hypertension: Secondary | ICD-10-CM

## 2012-08-10 DIAGNOSIS — E119 Type 2 diabetes mellitus without complications: Secondary | ICD-10-CM

## 2012-08-10 LAB — RENAL FUNCTION PANEL
CO2: 27 mEq/L (ref 19–32)
Chloride: 96 mEq/L (ref 96–112)
Creat: 1.26 mg/dL (ref 0.50–1.35)
Phosphorus: 2.5 mg/dL (ref 2.3–4.6)
Potassium: 4.2 mEq/L (ref 3.5–5.3)
Sodium: 139 mEq/L (ref 135–145)

## 2012-08-10 LAB — HEPATIC FUNCTION PANEL
AST: 27 U/L (ref 0–37)
Albumin: 3.9 g/dL (ref 3.5–5.2)
Alkaline Phosphatase: 66 U/L (ref 39–117)
Bilirubin, Direct: 0.1 mg/dL (ref 0.0–0.3)
Total Bilirubin: 0.3 mg/dL (ref 0.3–1.2)

## 2012-08-10 LAB — LIPID PANEL
Cholesterol: 182 mg/dL (ref 0–200)
HDL: 40 mg/dL (ref >39)
Triglycerides: 134 mg/dL (ref <150)
VLDL: 27 mg/dL (ref 0–40)

## 2012-08-10 LAB — TSH: TSH: 2.032 u[IU]/mL (ref 0.350–4.500)

## 2012-08-10 MED ORDER — FLUTICASONE-SALMETEROL 250-50 MCG/DOSE IN AEPB: 1.0000 | INHALATION_SPRAY | Freq: Two times a day (BID) | RESPIRATORY_TRACT | Status: DC

## 2012-08-10 MED ORDER — "PEN NEEDLES 5/16"" 31G X 8 MM MISC": Status: DC

## 2012-08-10 NOTE — Patient Instructions (Signed)

## 2012-08-11 ENCOUNTER — Encounter: Payer: Self-pay | Admitting: *Deleted

## 2012-08-13 NOTE — Assessment & Plan Note (Signed)
Encouraged DASH diet and referred for consideration of bariatric surgery.

## 2012-08-13 NOTE — Assessment & Plan Note (Signed)
hgba1c slightly improved. No changes to meds today

## 2012-08-13 NOTE — Assessment & Plan Note (Signed)
Well controlled, no changes to meds today 

## 2012-08-13 NOTE — Progress Notes (Signed)
Patient ID: Dylan Owen, male   DOB: 1965-06-07, 47 y.o.   MRN: [id number] Dylan Owen [id number] Jan 01, 1966 08/13/2012      Progress Note-Follow Up  Subjective  Chief Complaint  Chief Complaint  Patient presents with  . Follow-up    on diabetic medications  . Referral    for weight loss    HPI  Patient is a 47 year old male in today for followup. Patient is interested in proceeding with bariatric surgery and is requesting referral. Is not following a diet or exercise regimen at this time. No recent illness. No complaints of fevers or chills. No chest pain palpitations. Does have shortness of breath with exertion. Follows with Dr. Vear Clock at pain management for chronic pain. Is taking his medications as prescribed  Past Medical History  Diagnosis Date  . COPD (chronic obstructive pulmonary disease)   . Diabetes mellitus     Type II  . Hypertension   . LBP (low back pain)   . Chronic kidney disease 2007    hx of renal insuficiency attributed to ACE-I use  . Stable angina     nl myoview 12/07  followed by Dr Eden Emms  . GERD (gastroesophageal reflux disease)   . Peripheral neuropathy   . OSA (obstructive sleep apnea)     on nighttime home O2 (refuses to wear CPAP because of claustrophobia)  . B12 deficiency     Past Surgical History  Procedure Laterality Date  . Spine surgery      lumbar laminectomy with ensuing arachnoiditis    Family History  Problem Relation Age of Onset  . Heart disease Other   . Hypertension Other   . Other Other     Emotional Illness    History   Social History  . Marital Status: Married    Spouse Name: N/A    Number of Children: N/A  . Years of Education: N/A   Occupational History  . Not on file.   Social History Main Topics  . Smoking status: Never Smoker   . Smokeless tobacco: Current User     Comment: dip  . Alcohol Use: No  . Drug Use: No  . Sexually Active: Not on file   Other Topics Concern  . Not on file    Social History Narrative   Regular exercise:  No          Current Outpatient Prescriptions on File Prior to Visit  Medication Sig Dispense Refill  . albuterol (PROVENTIL) (2.5 MG/3ML) 0.083% nebulizer solution Take 2.5 mg by nebulization every 6 (six) hours as needed for wheezing.  75 mL  12  . Cholecalciferol (DIALYVITE VITAMIN D 5000) 5000 UNITS capsule Take 5,000 Units by mouth daily.        . diazepam (VALIUM) 5 MG tablet Take 5 mg by mouth every 6 (six) hours as needed. As needed for anxiety and muscle spasms      . fluticasone (FLONASE) 50 MCG/ACT nasal spray 2 SPRAYS EACH NOSTRIL ONCE DAILY  16 g  1  . furosemide (LASIX) 20 MG tablet TAKE 1 TABLET BY MOUTH EVERY MORNING AS NEEDED FOR SWELLING  30 tablet  1  . glipiZIDE (GLUCOTROL) 10 MG tablet TAKE 2 TABLETS BY MOUTH EVERY MORNING AND TAKE 1 TABLET EVERY EVENING  90 tablet  3  . glucose blood (ONE TOUCH TEST STRIPS) test strip 1 each by Other route as needed. Use as instructed to check blood sugar before breakfast, before you go to sleep, and  if you develop symptoms of lethargy or confusion       . insulin glargine (LANTUS) 100 UNIT/ML injection Inject 65 Units into the skin at bedtime.  24 mL  6  . ipratropium (ATROVENT) 0.02 % nebulizer solution Take 2.5 mLs (500 mcg total) by nebulization 4 (four) times daily.  75 mL  12  . lisinopril (PRINIVIL,ZESTRIL) 5 MG tablet Take 1 tablet (5 mg total) by mouth daily.  30 tablet  3  . metFORMIN (GLUCOPHAGE) 1000 MG tablet Take 0.5 tablets (500 mg total) by mouth 2 (two) times daily with a meal.  60 tablet  0  . metoprolol (LOPRESSOR) 50 MG tablet TAKE 1 TABLET BY MOUTH TWICE A DAY  60 tablet  3  . morphine (MS CONTIN) 30 MG 12 hr tablet Take 30 mg by mouth every 8 (eight) hours.       . Multiple Vitamin (MULTIVITAMIN) tablet Take 1 tablet by mouth daily.        . nitroGLYCERIN (NITROSTAT) 0.4 MG SL tablet Place 1 tablet (0.4 mg total) under the tongue every 5 (five) minutes as needed.  30  tablet  1  . ranitidine (ZANTAC) 150 MG tablet Take 150 mg by mouth daily.        . sitaGLIPtin (JANUVIA) 100 MG tablet Take 1 tablet (100 mg total) by mouth daily.  30 tablet  6  . venlafaxine (EFFEXOR) 75 MG tablet Take 75 mg by mouth 2 (two) times daily.       No current facility-administered medications on file prior to visit.    Allergies  Allergen Reactions  . Ace Inhibitors     Kidney failure  . Prednisone     REACTION: Anxiety  . Septra (Sulfamethoxazole-Tmp Ds)     Review of Systems  Review of Systems  Constitutional: Negative for fever and malaise/fatigue.  HENT: Negative for congestion.   Eyes: Negative for discharge.  Respiratory: Negative for shortness of breath.   Cardiovascular: Negative for chest pain, palpitations and leg swelling.  Gastrointestinal: Negative for nausea, abdominal pain and diarrhea.  Genitourinary: Negative for dysuria.  Musculoskeletal: Negative for falls.  Skin: Negative for rash.  Neurological: Negative for loss of consciousness and headaches.  Endo/Heme/Allergies: Negative for polydipsia.  Psychiatric/Behavioral: Negative for depression and suicidal ideas. The patient is not nervous/anxious and does not have insomnia.     Objective  BP 124/90  Pulse 96  Temp(Src) 98.4 F (36.9 C) (Oral)  Ht 6' (1.829 m)  Wt 354 lb 1.9 oz (160.628 kg)  BMI 48.02 kg/m2  SpO2 95%  Physical Exam  Physical Exam  Constitutional: He is oriented to person, place, and time and well-developed, well-nourished, and in no distress. No distress.  HENT:  Head: Normocephalic and atraumatic.  Eyes: Conjunctivae are normal.  Neck: Neck supple. No thyromegaly present.  Cardiovascular: Normal rate, regular rhythm and normal heart sounds.   No murmur heard. Pulmonary/Chest: Effort normal and breath sounds normal. No respiratory distress.  Abdominal: He exhibits no distension and no mass. There is no tenderness.  Musculoskeletal: He exhibits no edema.   Neurological: He is alert and oriented to person, place, and time.  Skin: Skin is warm.  Psychiatric: Memory, affect and judgment normal.    Lab Results  Component Value Date   TSH 2.032 08/10/2012   Lab Results  Component Value Date   WBC 9.0 10/09/2011   HGB 13.4 10/09/2011   HCT 39.5 10/09/2011   MCV 84.0 10/09/2011   PLT 337  10/09/2011   Lab Results  Component Value Date   CREATININE 1.26 08/10/2012   BUN 15 08/10/2012   NA 139 08/10/2012   K 4.2 08/10/2012   CL 96 08/10/2012   CO2 27 08/10/2012   Lab Results  Component Value Date   ALT 38 08/10/2012   AST 27 08/10/2012   ALKPHOS 66 08/10/2012   BILITOT 0.3 08/10/2012   Lab Results  Component Value Date   CHOL 182 08/10/2012   Lab Results  Component Value Date   HDL 40 08/10/2012   Lab Results  Component Value Date   LDLCALC 115* 08/10/2012   Lab Results  Component Value Date   TRIG 134 08/10/2012   Lab Results  Component Value Date   CHOLHDL 4.6 08/10/2012     Assessment & Plan  HYPERTENSION Well controlled, no changes to meds today  DIABETES MELLITUS, TYPE II hgba1c slightly improved. No changes to meds today  Obesity Encouraged DASH diet and referred for consideration of bariatric surgery.   SLEEP APNEA, OBSTRUCTIVE Uses CPAP routinely

## 2012-08-13 NOTE — Assessment & Plan Note (Signed)
Uses CPAP routinely 

## 2012-09-09 ENCOUNTER — Other Ambulatory Visit: Payer: Self-pay | Admitting: Family Medicine

## 2012-10-09 ENCOUNTER — Telehealth: Payer: Self-pay | Admitting: Family Medicine

## 2012-10-09 MED ORDER — GLIPIZIDE 10 MG PO TABS: 10.0000 mg | ORAL_TABLET | ORAL | Status: DC

## 2012-10-09 NOTE — Telephone Encounter (Signed)
Refill- glipizide 10mg  tablet. Take two tablets by mouth every morning and take one tablet every evening. Qty 90 last fill 6.7.14

## 2012-11-07 ENCOUNTER — Other Ambulatory Visit: Payer: Self-pay | Admitting: Family Medicine

## 2012-11-13 ENCOUNTER — Ambulatory Visit: Payer: Medicare Other | Admitting: Family Medicine

## 2012-12-11 ENCOUNTER — Ambulatory Visit: Payer: Medicare Other | Admitting: Family Medicine

## 2012-12-28 ENCOUNTER — Other Ambulatory Visit: Payer: Self-pay | Admitting: Family Medicine

## 2012-12-29 ENCOUNTER — Other Ambulatory Visit: Payer: Self-pay | Admitting: Family Medicine

## 2013-01-11 ENCOUNTER — Ambulatory Visit: Payer: Medicare Other | Admitting: Family Medicine

## 2013-01-14 ENCOUNTER — Other Ambulatory Visit: Payer: Self-pay | Admitting: Family Medicine

## 2013-01-15 NOTE — Telephone Encounter (Signed)
Rx request to pharmacy/SLS  

## 2013-02-26 ENCOUNTER — Other Ambulatory Visit: Payer: Self-pay | Admitting: *Deleted

## 2013-02-26 ENCOUNTER — Other Ambulatory Visit: Payer: Self-pay

## 2013-02-26 MED ORDER — METOPROLOL TARTRATE 50 MG PO TABS: ORAL_TABLET | ORAL | Status: DC

## 2013-02-26 MED ORDER — FUROSEMIDE 20 MG PO TABS: ORAL_TABLET | ORAL | Status: DC

## 2013-02-26 NOTE — Progress Notes (Signed)
Faxed refill request received from pharmacy for 90-day supply: Furosemide & Metoprolol Rx request to pharmacy/SLS

## 2013-02-26 NOTE — Telephone Encounter (Signed)
Received an RX refill for 90 day Metoprolol.   Left a detailed message on pts answering machine stating that we can't send in a 90 day supply of Metoprolol due to MD wanting to see patient around 11-10-12 and there had been some cancelled appts.  I did state that I would send in a 30 day supply and for pt to please call and schedule an appt for a follow up

## 2013-04-12 ENCOUNTER — Other Ambulatory Visit: Payer: Self-pay | Admitting: Family Medicine

## 2013-05-08 ENCOUNTER — Other Ambulatory Visit: Payer: Self-pay | Admitting: Family Medicine

## 2013-05-08 NOTE — Telephone Encounter (Signed)
Pt seen on 08-10-12 was asked to return in 3 months. Pt cancelled 11-13-12, 12-11-12 and 01-11-13.   We will send in a 30 day supply of Glipizide. Pt needs an appt to continue refills.  Please call and schedule appt

## 2013-05-08 NOTE — Telephone Encounter (Signed)
Left detailed message of medication refill and that he needs to be seen before further refills can be given.

## 2013-05-09 ENCOUNTER — Telehealth: Payer: Self-pay | Admitting: Family Medicine

## 2013-05-09 NOTE — Telephone Encounter (Signed)
Received prior auth requesting quantity limit for pt's glipizide. Form submitted via cover my meds website. Awaiting approval / denial status.

## 2013-05-10 NOTE — Telephone Encounter (Signed)
Glipizide 10 mg (3 per day) is approved through 04-04-14

## 2013-06-19 ENCOUNTER — Telehealth: Payer: Self-pay | Admitting: Family Medicine

## 2013-06-19 MED ORDER — GLIPIZIDE 10 MG PO TABS: 10.0000 mg | ORAL_TABLET | Freq: Every day | ORAL | Status: DC

## 2013-06-19 NOTE — Telephone Encounter (Signed)
Please inform pt that we sent in 1 month supply but we havent seen him since 5-14 and MD wanted him to return. The last 3 appointments have been cancelled

## 2013-06-19 NOTE — Telephone Encounter (Signed)
Letter sent.

## 2013-06-19 NOTE — Telephone Encounter (Signed)
Refill glipizide 

## 2013-06-19 NOTE — Telephone Encounter (Signed)
See below note.

## 2013-06-19 NOTE — Telephone Encounter (Signed)
Phone keeps ringing fast busy signal every time that i have called

## 2013-07-10 ENCOUNTER — Other Ambulatory Visit: Payer: Self-pay | Admitting: Family Medicine

## 2013-07-10 NOTE — Telephone Encounter (Signed)
Phone out of order. Will try again later.

## 2013-07-10 NOTE — Telephone Encounter (Signed)
90 day supply of metoprolol and lasix sent to pharmacy. Pt was last seen in May 2014. Was due for follow up in 11/2012 and is past due.  Further refills will not be sent to pharmacy until pt is seen in the office.  Please call pt to arrange appt.

## 2013-07-11 NOTE — Telephone Encounter (Signed)
Left detailed message informing patient of medication refill and that he needs to be seen before further refills can be given.

## 2013-07-12 NOTE — Telephone Encounter (Signed)
Left message for patient to return my call.

## 2013-07-16 NOTE — Telephone Encounter (Signed)
Mailed letter to pt

## 2013-08-13 ENCOUNTER — Telehealth: Payer: Self-pay | Admitting: Family Medicine

## 2013-08-13 ENCOUNTER — Ambulatory Visit: Payer: Medicare Other | Admitting: Family Medicine

## 2013-08-13 MED ORDER — GLIPIZIDE 10 MG PO TABS: 10.0000 mg | ORAL_TABLET | Freq: Every day | ORAL | Status: DC

## 2013-08-13 NOTE — Telephone Encounter (Signed)
Refill-glipizide  [id number] in [place], [place]

## 2013-08-23 ENCOUNTER — Telehealth: Payer: Self-pay | Admitting: Family Medicine

## 2013-08-23 NOTE — Telephone Encounter (Signed)
Please advise on refill. Pt's last Ov 08/10/2012

## 2013-08-23 NOTE — Telephone Encounter (Signed)
Metformin hcl 1000 mg tablet take 1/2 tablet by mouth twice a day with a meal qty 60 [facility]

## 2013-08-24 MED ORDER — METFORMIN HCL 1000 MG PO TABS: ORAL_TABLET | ORAL | Status: DC

## 2013-08-24 NOTE — Telephone Encounter (Signed)
She can have a 30 day supply of the Metformin but warn her she needs an office visit and labs to get anymore

## 2013-08-24 NOTE — Telephone Encounter (Signed)
Sent in Rx to pharmacy, I warned pt on voicemail to make an appt for additional refills.

## 2013-09-05 ENCOUNTER — Telehealth: Payer: Self-pay | Admitting: Family Medicine

## 2013-09-05 NOTE — Telephone Encounter (Signed)
OK with me if OK with [facility]

## 2013-09-05 NOTE — Telephone Encounter (Signed)
Patient would like to transfer to the [facility] office, due to location. Is this okay?

## 2013-09-06 NOTE — Telephone Encounter (Signed)
No answer, no voicemail.  Will try again later.

## 2013-09-07 ENCOUNTER — Ambulatory Visit: Payer: Medicare Other | Admitting: Family Medicine

## 2013-10-26 ENCOUNTER — Other Ambulatory Visit: Payer: Self-pay | Admitting: Family Medicine

## 2013-11-09 ENCOUNTER — Encounter: Payer: Self-pay | Admitting: Family Medicine

## 2013-11-09 ENCOUNTER — Ambulatory Visit (INDEPENDENT_AMBULATORY_CARE_PROVIDER_SITE_OTHER): Payer: Medicare Other | Admitting: Family Medicine

## 2013-11-09 VITALS — BP 152/99 | HR 99 | Temp 98.8°F | Resp 20 | Ht 72.0 in | Wt 299.0 lb

## 2013-11-09 DIAGNOSIS — E119 Type 2 diabetes mellitus without complications: Secondary | ICD-10-CM

## 2013-11-09 DIAGNOSIS — R7989 Other specified abnormal findings of blood chemistry: Secondary | ICD-10-CM

## 2013-11-09 DIAGNOSIS — I10 Essential (primary) hypertension: Secondary | ICD-10-CM

## 2013-11-09 LAB — COMPREHENSIVE METABOLIC PANEL
ALT: 30 U/L (ref 0–53)
AST: 35 U/L (ref 0–37)
Albumin: 3.8 g/dL (ref 3.5–5.2)
Alkaline Phosphatase: 87 U/L (ref 39–117)
BILIRUBIN TOTAL: 0.8 mg/dL (ref 0.2–1.2)
BUN: 9 mg/dL (ref 6–23)
CALCIUM: 9.2 mg/dL (ref 8.4–10.5)
CHLORIDE: 91 meq/L — AB (ref 96–112)
CO2: 29 meq/L (ref 19–32)
CREATININE: 0.9 mg/dL (ref 0.4–1.5)
GFR: 101.08 mL/min (ref >60.00)
GLUCOSE: 330 mg/dL — AB (ref 70–99)
Potassium: 4.2 mEq/L (ref 3.5–5.1)
Sodium: 133 mEq/L — ABNORMAL LOW (ref 135–145)
Total Protein: 7.7 g/dL (ref 6.0–8.3)

## 2013-11-09 LAB — LIPID PANEL
CHOLESTEROL: 231 mg/dL — AB (ref 0–200)
HDL: 45 mg/dL (ref >39.00)
NONHDL: 186
Total CHOL/HDL Ratio: 5
Triglycerides: 275 mg/dL — ABNORMAL HIGH (ref 0.0–149.0)
VLDL: 55 mg/dL — ABNORMAL HIGH (ref 0.0–40.0)

## 2013-11-09 LAB — CBC WITH DIFFERENTIAL/PLATELET
BASOS ABS: 0.1 10*3/uL (ref 0.0–0.1)
Basophils Relative: 0.3 % (ref 0.0–3.0)
EOS ABS: 0.1 10*3/uL (ref 0.0–0.7)
Eosinophils Relative: 0.8 % (ref 0.0–5.0)
HEMATOCRIT: 49.3 % (ref 39.0–52.0)
Hemoglobin: 16.3 g/dL (ref 13.0–17.0)
LYMPHS ABS: 2.5 10*3/uL (ref 0.7–4.0)
Lymphocytes Relative: 15.2 % (ref 12.0–46.0)
MCHC: 33 g/dL (ref 30.0–36.0)
MCV: 84.1 fl (ref 78.0–100.0)
MONO ABS: 1.4 10*3/uL — AB (ref 0.1–1.0)
MONOS PCT: 8.8 % (ref 3.0–12.0)
NEUTROS ABS: 12.1 10*3/uL — AB (ref 1.4–7.7)
Neutrophils Relative %: 74.9 % (ref 43.0–77.0)
PLATELETS: 377 10*3/uL (ref 150.0–400.0)
RBC: 5.86 Mil/uL — ABNORMAL HIGH (ref 4.22–5.81)
RDW: 13.5 % (ref 11.5–15.5)
WBC: 16.1 10*3/uL — ABNORMAL HIGH (ref 4.0–10.5)

## 2013-11-09 LAB — MICROALBUMIN / CREATININE URINE RATIO
Creatinine,U: 104.4 mg/dL
MICROALB UR: 14 mg/dL — AB (ref 0.0–1.9)
Microalb Creat Ratio: 13.4 mg/g (ref 0.0–30.0)

## 2013-11-09 LAB — LDL CHOLESTEROL, DIRECT: LDL DIRECT: 166.2 mg/dL

## 2013-11-09 LAB — TSH: TSH: 2.37 u[IU]/mL (ref 0.35–4.50)

## 2013-11-09 LAB — HEMOGLOBIN A1C: HEMOGLOBIN A1C: 13.6 % — AB (ref 4.6–6.5)

## 2013-11-09 NOTE — Progress Notes (Signed)
Pre visit review using our clinic review tool, if applicable. No additional management support is needed unless otherwise documented below in the visit note. 

## 2013-11-09 NOTE — Progress Notes (Signed)
Office Note 11/13/2013  CC:  Chief Complaint  Patient presents with  . Establish Care    fasting    HPI:  Dylan Owen is a 48 y.o. White male who is here to transfer care. Patient's most recent primary MD: Dr. Charlett Blake. Old records in EPIC/[facility] EMR were reviewed prior to or during today's visit.  Most recent o/v with Dr. Charlett Blake was 08/2012, and this was the time of his most recent labs.  Has been dieting and has lost 50 lbs in the last week. However, says glucoses are always 400-500.  Has been out of his janivia, lantus, and glipizide for at least 3 mo. He has been taking 1000 mg metformin bid since he has been out of these other meds.  He does not monitor his bp at home.    Past Medical History  Diagnosis Date  . COPD (chronic obstructive pulmonary disease)   . Diabetes mellitus     Type II  . Hypertension   . Chronic low back pain     Dr. Hardin Negus is pain mgmt MD  . Chronic kidney disease 2007    hx of renal insuficiency attributed to ACE-I use  . Stable angina     nl myoview 12/07  followed by Dr Johnsie Cancel  . GERD (gastroesophageal reflux disease)   . Peripheral neuropathy   . OSA (obstructive sleep apnea)     on nighttime home O2 (refuses to wear CPAP because of claustrophobia)  . B12 deficiency     Past Surgical History  Procedure Laterality Date  . Lumbar spine surgery      lumbar laminectomy with hardware stabilization, with ensuing arachnoiditis  . Urethral stricture dilatation  1996 and 2004.    Laser surgery.    Family History  Problem Relation Age of Onset  . Heart disease Other   . Hypertension Other   . Other Other     Emotional Illness  . Cancer Paternal Grandfather     prostate    History   Social History  . Marital Status: Married    Spouse Name: N/A    Number of Children: N/A  . Years of Education: N/A   Occupational History  . Not on file.   Social History Main Topics  . Smoking status: Never Smoker   . Smokeless tobacco:  Current User     Comment: dip  . Alcohol Use: No  . Drug Use: No  . Sexual Activity: Not on file   Other Topics Concern  . Not on file   Social History Narrative   Married, 4 children.   Occupation: disabled since 2000.     Prior to disability he worked for [employer].   Orig from [place].   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  No hx of drug abuse).   Chews tobacco since age 67 yrs.   Regular exercise:  No             Outpatient Encounter Prescriptions as of 11/09/2013  Medication Sig  . Cholecalciferol (DIALYVITE VITAMIN D 5000) 5000 UNITS capsule Take 5,000 Units by mouth daily.    . diazepam (VALIUM) 5 MG tablet Take 5 mg by mouth every 6 (six) hours as needed. As needed for anxiety and muscle spasms  . fluticasone (FLONASE) 50 MCG/ACT nasal spray 2 SPRAYS EACH NOSTRIL ONCE DAILY  . furosemide (LASIX) 20 MG tablet TAKE 1 TABLET BY MOUTH  EVERY MORNING AS NEEDED FOR SWELLING  . metoprolol (LOPRESSOR) 50 MG tablet TAKE 1 TABLET BY MOUTH TWICE A DAY  . morphine (MS CONTIN) 30 MG 12 hr tablet Take 30 mg by mouth every 8 (eight) hours.   . Multiple Vitamin (MULTIVITAMIN) tablet Take 1 tablet by mouth daily.    . nitroGLYCERIN (NITROSTAT) 0.4 MG SL tablet Place 1 tablet (0.4 mg total) under the tongue every 5 (five) minutes as needed.  . ranitidine (ZANTAC) 150 MG tablet Take 150 mg by mouth daily.    Marland Kitchen venlafaxine (EFFEXOR) 75 MG tablet Take 75 mg by mouth 2 (two) times daily.  . [DISCONTINUED] metFORMIN (GLUCOPHAGE) 1000 MG tablet TAKE 1/2 TABLET BY MOUTH TWICE A DAY WITH A MEAL  . albuterol (PROVENTIL) (2.5 MG/3ML) 0.083% nebulizer solution Take 2.5 mg by nebulization every 6 (six) hours as needed for wheezing.  . Fluticasone-Salmeterol (ADVAIR DISKUS) 250-50 MCG/DOSE AEPB Inhale 1 puff into the lungs 2 (two) times daily.  Marland Kitchen glipiZIDE (GLUCOTROL) 10 MG tablet Take 1 tablet (10 mg total) by mouth daily before  breakfast.  . glucose blood (ONE TOUCH TEST STRIPS) test strip 1 each by Other route as needed. Use as instructed to check blood sugar before breakfast, before you go to sleep, and if you develop symptoms of lethargy or confusion   . insulin glargine (LANTUS) 100 UNIT/ML injection Inject 65 Units into the skin at bedtime.  . Insulin Pen Needle (PEN NEEDLES 31GX5/16") 31G X 8 MM MISC Use as directed once a day for medication injection  . ipratropium (ATROVENT) 0.02 % nebulizer solution Take 2.5 mLs (500 mcg total) by nebulization 4 (four) times daily.  Marland Kitchen lisinopril (PRINIVIL,ZESTRIL) 5 MG tablet Take 1 tablet (5 mg total) by mouth daily.  . sitaGLIPtin (JANUVIA) 100 MG tablet Take 1 tablet (100 mg total) by mouth daily.    Allergies  Allergen Reactions  . Ace Inhibitors     Kidney failure  . Prednisone     REACTION: Anxiety  . Septra [Sulfamethoxazole-Tmp Ds]     ROS Review of Systems  Constitutional: Negative for fever and fatigue.  Respiratory: Negative for cough and shortness of breath.   Cardiovascular: Negative for leg swelling.  Gastrointestinal: Negative for abdominal pain.  Endocrine: Negative for polydipsia and polyuria.  Genitourinary: Negative for dysuria, urgency and flank pain.  Musculoskeletal: Negative for joint swelling and neck pain.  Skin: Negative for rash.  Neurological: Negative for dizziness, tremors, weakness and numbness.    PE; Blood pressure 152/99, pulse 99, temperature 98.8 F (37.1 C), temperature source Oral, resp. rate 20, height 6' (1.829 m), weight 299 lb (135.626 kg), SpO2 96.00%. Gen: Alert, well appearing.  Patient is oriented to person, place, time, and situation. BJS:EGBT: no injection, icteris, swelling, or exudate.  EOMI, PERRLA. Mouth: lips without lesion/swelling.  Oral mucosa pink and moist. Oropharynx without erythema, exudate, or swelling.  Neck - No masses or thyromegaly or limitation in range of motion CV: RRR, no m/r/g.   LUNGS:  CTA bilat, nonlabored resps, good aeration in all lung fields.  Pertinent labs:  Lab Results  Component Value Date   TSH 2.37 11/09/2013   Lab Results  Component Value Date   WBC 16.1* 11/09/2013   HGB 16.3 11/09/2013   HCT 49.3 11/09/2013   MCV 84.1 11/09/2013   PLT 377.0 11/09/2013   Lab Results  Component Value Date   CREATININE 0.9 11/09/2013   BUN 9 11/09/2013   NA 133* 11/09/2013  K 4.2 11/09/2013   CL 91* 11/09/2013   CO2 29 11/09/2013   Lab Results  Component Value Date   ALT 30 11/09/2013   AST 35 11/09/2013   ALKPHOS 87 11/09/2013   BILITOT 0.8 11/09/2013   Lab Results  Component Value Date   CHOL 231* 11/09/2013   Lab Results  Component Value Date   HDL 45.00 11/09/2013   Lab Results  Component Value Date   LDLCALC 115* 08/10/2012   Lab Results  Component Value Date   TRIG 275.0* 11/09/2013   Lab Results  Component Value Date   CHOLHDL 5 11/09/2013   No results found for this basename: PSA   ASSESSMENT AND PLAN:   Transfer patient:  DIABETES MELLITUS, TYPE II Poor control, noncompliance with meds Celesta Gentile, glucotrol, lantus) and diet.   Pt prefers novolin 70/30 if /when we get him back on insulin. Await HbA1c, fasting health panel, and urine microalb/cr.  HYPERTENSION Continue lopressor and lisinopril.   An After Visit Summary was printed and given to the patient.  Return in about 2 weeks (around 11/23/2013) for f/u DM and HTN.

## 2013-11-12 ENCOUNTER — Telehealth: Payer: Self-pay | Admitting: Family Medicine

## 2013-11-12 ENCOUNTER — Other Ambulatory Visit: Payer: Self-pay | Admitting: Family Medicine

## 2013-11-12 MED ORDER — INSULIN NPH ISOPHANE & REGULAR (70-30) 100 UNIT/ML ~~LOC~~ SUSP: SUBCUTANEOUS | Status: DC

## 2013-11-12 MED ORDER — METFORMIN HCL 1000 MG PO TABS: 1000.0000 mg | ORAL_TABLET | Freq: Two times a day (BID) | ORAL | Status: DC

## 2013-11-12 MED ORDER — ATORVASTATIN CALCIUM 40 MG PO TABS: 40.0000 mg | ORAL_TABLET | Freq: Every day | ORAL | Status: DC

## 2013-11-12 NOTE — Telephone Encounter (Signed)
Relevant patient education assigned to patient using Emmi. ° °

## 2013-11-13 ENCOUNTER — Encounter: Payer: Self-pay | Admitting: Family Medicine

## 2013-11-13 NOTE — Assessment & Plan Note (Addendum)
Poor control, noncompliance with meds Celesta Gentile, glucotrol, lantus) and diet.   Pt prefers novolin 70/30 if /when we get him back on insulin. Await HbA1c, fasting health panel, and urine microalb/cr.

## 2013-11-13 NOTE — Assessment & Plan Note (Signed)
Continue lopressor and lisinopril.

## 2013-11-23 ENCOUNTER — Encounter: Payer: Self-pay | Admitting: Family Medicine

## 2013-11-23 ENCOUNTER — Ambulatory Visit (INDEPENDENT_AMBULATORY_CARE_PROVIDER_SITE_OTHER): Payer: Medicare Other | Admitting: Family Medicine

## 2013-11-23 VITALS — BP 139/83 | HR 77 | Temp 97.4°F | Ht 72.0 in | Wt 313.0 lb

## 2013-11-23 DIAGNOSIS — R0989 Other specified symptoms and signs involving the circulatory and respiratory systems: Secondary | ICD-10-CM

## 2013-11-23 DIAGNOSIS — I10 Essential (primary) hypertension: Secondary | ICD-10-CM

## 2013-11-23 DIAGNOSIS — E785 Hyperlipidemia, unspecified: Secondary | ICD-10-CM

## 2013-11-23 DIAGNOSIS — E1159 Type 2 diabetes mellitus with other circulatory complications: Secondary | ICD-10-CM

## 2013-11-23 MED ORDER — INSULIN NPH ISOPHANE & REGULAR (70-30) 100 UNIT/ML ~~LOC~~ SUSP: SUBCUTANEOUS | Status: DC

## 2013-11-23 NOTE — Assessment & Plan Note (Signed)
The current medical regimen is effective;  continue present plan and medications.  

## 2013-11-23 NOTE — Progress Notes (Signed)
OFFICE NOTE  11/23/2013  CC:  Chief Complaint  Patient presents with  . Follow-up    2 weeks   HPI: Patient is a 48 y.o. Caucasian male who is here for 2 wk f/u HTN, hyperlipidemia, and DM 2--poor control. After last labs I recommended he d/c januvia, glipizide, and lantus---and I recommended 70/30 insulin at 55 U qAM and 25 U qPM, continue 1000 mg metformin bid, also start atorvastatin 40mg  qd.  Gluc's reviewed today from last 10d: much improved fastings improved to 150-180 range Before lunch about 150s-180. Before supper still around 200s Before bed still mostly 250 or above. He is eating a diabetic diet.    FEET: no burning or tingling.  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  ROS: left shoulder and upper arm pain lately, hurts when aBducting it. Denies claudication but he reiterates he has "numb" legs bilat secondary to nerve damage from chronic L/S spine issues.  Pertinent PMH:  Past medical, surgical, social, and family history reviewed and no changes are noted since last office visit.  MEDS:  Outpatient Prescriptions Prior to Visit  Medication Sig Dispense Refill  . albuterol (PROVENTIL) (2.5 MG/3ML) 0.083% nebulizer solution Take 2.5 mg by nebulization every 6 (six) hours as needed for wheezing.  75 mL  12  . atorvastatin (LIPITOR) 40 MG tablet Take 1 tablet (40 mg total) by mouth daily.  30 tablet  3  . Cholecalciferol (DIALYVITE VITAMIN D 5000) 5000 UNITS capsule Take 5,000 Units by mouth daily.        . diazepam (VALIUM) 5 MG tablet Take 5 mg by mouth every 6 (six) hours as needed. As needed for anxiety and muscle spasms      . fluticasone (FLONASE) 50 MCG/ACT nasal spray 2 SPRAYS EACH NOSTRIL ONCE DAILY  16 g  1  . Fluticasone-Salmeterol (ADVAIR DISKUS) 250-50 MCG/DOSE AEPB Inhale 1 puff into the lungs 2 (two) times daily.  60 each  2  . furosemide (LASIX) 20 MG tablet TAKE 1 TABLET BY  MOUTH EVERY MORNING AS NEEDED FOR SWELLING  90 tablet  0  . glucose blood (ONE TOUCH TEST STRIPS) test strip 1 each by Other route as needed. Use as instructed to check blood sugar before breakfast, before you go to sleep, and if you develop symptoms of lethargy or confusion       . Insulin Pen Needle (PEN NEEDLES 31GX5/16") 31G X 8 MM MISC Use as directed once a day for medication injection  100 each  1  . ipratropium (ATROVENT) 0.02 % nebulizer solution Take 2.5 mLs (500 mcg total) by nebulization 4 (four) times daily.  75 mL  12  . lisinopril (PRINIVIL,ZESTRIL) 5 MG tablet Take 1 tablet (5 mg total) by mouth daily.  30 tablet  3  . metFORMIN (GLUCOPHAGE) 1000 MG tablet Take 1 tablet (1,000 mg total) by mouth 2 (two) times daily with a meal. TAKE 1/2 TABLET BY MOUTH TWICE A DAY WITH A MEAL  60 tablet  11  . metoprolol (LOPRESSOR) 50 MG tablet TAKE 1 TABLET BY MOUTH TWICE A DAY  180 tablet  0  . morphine (MS CONTIN) 30 MG 12 hr tablet Take 30 mg by mouth every 8 (eight) hours.       . Multiple Vitamin (MULTIVITAMIN) tablet Take 1 tablet by mouth daily.        . nitroGLYCERIN (NITROSTAT) 0.4 MG SL tablet Place 1 tablet (0.4 mg  total) under the tongue every 5 (five) minutes as needed.  30 tablet  1  . ranitidine (ZANTAC) 150 MG tablet Take 150 mg by mouth daily.        Marland Kitchen venlafaxine (EFFEXOR) 75 MG tablet Take 75 mg by mouth 2 (two) times daily.      Marland Kitchen glipiZIDE (GLUCOTROL) 10 MG tablet Take 1 tablet (10 mg total) by mouth daily before breakfast.  30 tablet  0  . insulin glargine (LANTUS) 100 UNIT/ML injection Inject 65 Units into the skin at bedtime.  24 mL  6  . insulin NPH-regular Human (NOVOLIN 70/30) (70-30) 100 UNIT/ML injection 55 Units SQ with breakfast and 25 Units SQ with supper  5 vial  11  . sitaGLIPtin (JANUVIA) 100 MG tablet Take 1 tablet (100 mg total) by mouth daily.  30 tablet  6   No facility-administered medications prior to visit.    PE: Blood pressure 139/83, pulse 77,  temperature 97.4 F (36.3 C), temperature source Temporal, height 6' (1.829 m), weight 313 lb (141.976 kg), SpO2 98.00%. Gen: Alert, well appearing.  Patient is oriented to person, place, time, and situation. Foot exam -  no swelling, tenderness or skin or vascular lesions. Color is violacious hue diffusely over both feet, temp is a bit cool and cap refill is 3 sec in each foot.  I cannot palpate DP or PT pulses.  He has no reaction to monofilament testing in either foot or lower leg.  Toenails are normal.  Some mild diffuse skin flakiness on both feet.   IMPRESSION AND PLAN:  Type II or unspecified type diabetes mellitus with peripheral circulatory disorders, uncontrolled(250.72) Glucoses improving. Increase novolin 70/30 to 60 U qAM and 30 U qPM. Continue diabetic diet.  Continue metformin 1000 mg bid.  HYPERTENSION The current medical regimen is effective;  continue present plan and medications.   Hyperlipidemia Just started atorv and is tolerating it fine. Will consider increasing to max dose in 3-6 mo if LDL not <70 or if he shows periph vasc disesase on upcoming testing.  Diminished pulses in lower extremity Bilaterally. Set up ABIs--ordered today.   An After Visit Summary was printed and given to the patient.  FOLLOW UP: 2 wks

## 2013-11-23 NOTE — Assessment & Plan Note (Signed)
Glucoses improving. Increase novolin 70/30 to 60 U qAM and 30 U qPM. Continue diabetic diet.  Continue metformin 1000 mg bid.

## 2013-11-23 NOTE — Assessment & Plan Note (Signed)
Bilaterally. Set up ABIs--ordered today.

## 2013-11-23 NOTE — Assessment & Plan Note (Signed)
Just started atorv and is tolerating it fine. Will consider increasing to max dose in 3-6 mo if LDL not <70 or if he shows periph vasc disesase on upcoming testing.

## 2013-11-23 NOTE — Progress Notes (Signed)
Pre visit review using our clinic review tool, if applicable. No additional management support is needed unless otherwise documented below in the visit note. 

## 2013-11-28 ENCOUNTER — Encounter (HOSPITAL_COMMUNITY): Payer: Medicare Other

## 2013-12-07 ENCOUNTER — Ambulatory Visit: Payer: Medicare Other | Admitting: Family Medicine

## 2013-12-13 ENCOUNTER — Ambulatory Visit (HOSPITAL_COMMUNITY): Payer: Medicare Other | Attending: Cardiovascular Disease | Admitting: Cardiology

## 2013-12-13 DIAGNOSIS — E1159 Type 2 diabetes mellitus with other circulatory complications: Secondary | ICD-10-CM | POA: Diagnosis not present

## 2013-12-13 DIAGNOSIS — J449 Chronic obstructive pulmonary disease, unspecified: Secondary | ICD-10-CM | POA: Insufficient documentation

## 2013-12-13 DIAGNOSIS — I10 Essential (primary) hypertension: Secondary | ICD-10-CM | POA: Diagnosis not present

## 2013-12-13 DIAGNOSIS — R0989 Other specified symptoms and signs involving the circulatory and respiratory systems: Secondary | ICD-10-CM | POA: Diagnosis present

## 2013-12-13 DIAGNOSIS — I739 Peripheral vascular disease, unspecified: Secondary | ICD-10-CM | POA: Diagnosis not present

## 2013-12-13 DIAGNOSIS — J4489 Other specified chronic obstructive pulmonary disease: Secondary | ICD-10-CM | POA: Insufficient documentation

## 2013-12-13 NOTE — Progress Notes (Signed)
LEA Doppler/ABI performed 

## 2013-12-19 ENCOUNTER — Encounter: Payer: Self-pay | Admitting: Family Medicine

## 2013-12-19 ENCOUNTER — Ambulatory Visit (INDEPENDENT_AMBULATORY_CARE_PROVIDER_SITE_OTHER): Payer: Medicare Other | Admitting: Family Medicine

## 2013-12-19 VITALS — BP 133/83 | HR 84 | Temp 98.5°F | Resp 22 | Ht 72.0 in | Wt 310.0 lb

## 2013-12-19 DIAGNOSIS — E1159 Type 2 diabetes mellitus with other circulatory complications: Secondary | ICD-10-CM

## 2013-12-19 DIAGNOSIS — E785 Hyperlipidemia, unspecified: Secondary | ICD-10-CM

## 2013-12-19 DIAGNOSIS — I10 Essential (primary) hypertension: Secondary | ICD-10-CM

## 2013-12-19 DIAGNOSIS — Z23 Encounter for immunization: Secondary | ICD-10-CM

## 2013-12-19 DIAGNOSIS — R0989 Other specified symptoms and signs involving the circulatory and respiratory systems: Secondary | ICD-10-CM

## 2013-12-19 MED ORDER — METFORMIN HCL 1000 MG PO TABS: 1000.0000 mg | ORAL_TABLET | Freq: Two times a day (BID) | ORAL | Status: DC

## 2013-12-19 NOTE — Progress Notes (Signed)
Pre visit review using our clinic review tool, if applicable. No additional management support is needed unless otherwise documented below in the visit note. 

## 2013-12-19 NOTE — Patient Instructions (Signed)
Change your insulin 70/30 dosing to 55 Units at breakfast and 35 Units at Supper. The night before your next appt, don't take your insulin.  Also, don't take your insulin the morning of your next appt.----YOU NEED TO BE FASTING FOR THIS NEXT VISIT.

## 2013-12-19 NOTE — Progress Notes (Signed)
OFFICE NOTE  12/19/2013   CC:  Chief Complaint  Patient presents with  . Follow-up    not fasting     HPI: Patient is a 48 y.o. Caucasian male who is here for 3 wk f/u DM 2. Since last visit he got ABI's and these were normal.  Glucoses the last 2+ wks have been much more normal and consistent ever since he corrected the way he was giving his insulin shots (with BF and with Supper--not 1-2 hours after meals as he had been prone to do.)   However, some pre-lunch sugars are borderline low, while 2H PP supper sugars could use some improvement.  Still complaining of left > right shoulder and arm pain.  Has appt with pain mgmt MD later this month.  Pertinent PMH:  Past medical, surgical, social, and family history reviewed and no changes are noted since last office visit.  MEDS:  Outpatient Prescriptions Prior to Visit  Medication Sig Dispense Refill  . atorvastatin (LIPITOR) 40 MG tablet Take 1 tablet (40 mg total) by mouth daily.  30 tablet  3  . Cholecalciferol (DIALYVITE VITAMIN D 5000) 5000 UNITS capsule Take 5,000 Units by mouth daily.        . diazepam (VALIUM) 5 MG tablet Take 5 mg by mouth every 6 (six) hours as needed. As needed for anxiety and muscle spasms      . fluticasone (FLONASE) 50 MCG/ACT nasal spray 2 SPRAYS EACH NOSTRIL ONCE DAILY  16 g  1  . Fluticasone-Salmeterol (ADVAIR DISKUS) 250-50 MCG/DOSE AEPB Inhale 1 puff into the lungs 2 (two) times daily.  60 each  2  . furosemide (LASIX) 20 MG tablet TAKE 1 TABLET BY MOUTH EVERY MORNING AS NEEDED FOR SWELLING  90 tablet  0  . glucose blood (ONE TOUCH TEST STRIPS) test strip 1 each by Other route as needed. Use as instructed to check blood sugar before breakfast, before you go to sleep, and if you develop symptoms of lethargy or confusion       . insulin NPH-regular Human (NOVOLIN 70/30) (70-30) 100 UNIT/ML injection 60 Units SQ with breakfast and 30 Units SQ with supper  5 vial  11  . Insulin Pen Needle (PEN NEEDLES  31GX5/16") 31G X 8 MM MISC Use as directed once a day for medication injection  100 each  1  . lisinopril (PRINIVIL,ZESTRIL) 5 MG tablet Take 1 tablet (5 mg total) by mouth daily.  30 tablet  3  . metFORMIN (GLUCOPHAGE) 1000 MG tablet Take 1 tablet (1,000 mg total) by mouth 2 (two) times daily with a meal. TAKE 1/2 TABLET BY MOUTH TWICE A DAY WITH A MEAL  60 tablet  11  . metoprolol (LOPRESSOR) 50 MG tablet TAKE 1 TABLET BY MOUTH TWICE A DAY  180 tablet  0  . morphine (MS CONTIN) 30 MG 12 hr tablet Take 30 mg by mouth every 8 (eight) hours.       . Multiple Vitamin (MULTIVITAMIN) tablet Take 1 tablet by mouth daily.        . nitroGLYCERIN (NITROSTAT) 0.4 MG SL tablet Place 1 tablet (0.4 mg total) under the tongue every 5 (five) minutes as needed.  30 tablet  1  . ranitidine (ZANTAC) 150 MG tablet Take 150 mg by mouth daily.        Marland Kitchen venlafaxine (EFFEXOR) 75 MG tablet Take 75 mg by mouth 2 (two) times daily.      Marland Kitchen albuterol (PROVENTIL) (2.5 MG/3ML) 0.083% nebulizer  solution Take 2.5 mg by nebulization every 6 (six) hours as needed for wheezing.  75 mL  12  . ipratropium (ATROVENT) 0.02 % nebulizer solution Take 2.5 mLs (500 mcg total) by nebulization 4 (four) times daily.  75 mL  12   No facility-administered medications prior to visit.    PE: Blood pressure 133/83, pulse 84, temperature 98.5 F (36.9 C), temperature source Oral, resp. rate 22, height 6' (1.829 m), weight 310 lb (140.615 kg), SpO2 95.00%. Gen: Alert, well appearing.  Patient is oriented to person, place, time, and situation. CV: RRR, S1 and S2 distant, no murmur or rub Chest is clear, no wheezing or rales. Normal symmetric air entry throughout both lung fields. No chest wall deformities or tenderness.   IMPRESSION AND PLAN:  1) DM 2, insulin requiring, poor control but improving. Change insulin dosing to 70/30, 55 U qAM and 35 U qSupper. Continue metformin 1000 mg bid.  Continue diet.  2) HTN: The current medical regimen  is effective;  continue present plan and medications.  3) Hyperlipidemia: tolerating lipitor started recently.  4) Prev health care: flu vaccine and Tdap vaccine given today.  5) Diminished pulses and violaceous skin changes in LE's: ABI's recently were normal. No further w/u for PAD at this time.    FOLLOW UP: 2 mo, at which time we'll recheck FLP, CMET, and HBA1c.

## 2014-01-08 ENCOUNTER — Telehealth: Payer: Self-pay

## 2014-01-08 NOTE — Telephone Encounter (Signed)
Left a message for call back.  Called patient regarding diabetic eye exam.  When patient calls back please ask:  Have you had a recent (2014-2015) eye exam?    Date of Exam?  Where?    

## 2014-02-10 ENCOUNTER — Other Ambulatory Visit: Payer: Self-pay | Admitting: Family Medicine

## 2014-02-11 ENCOUNTER — Other Ambulatory Visit: Payer: Self-pay | Admitting: Family Medicine

## 2014-02-11 MED ORDER — METOPROLOL TARTRATE 50 MG PO TABS: ORAL_TABLET | ORAL | Status: DC

## 2014-02-18 ENCOUNTER — Ambulatory Visit (INDEPENDENT_AMBULATORY_CARE_PROVIDER_SITE_OTHER): Payer: Medicare Other | Admitting: Family Medicine

## 2014-02-18 ENCOUNTER — Encounter: Payer: Self-pay | Admitting: Family Medicine

## 2014-02-18 VITALS — BP 107/72 | HR 81 | Temp 97.5°F | Resp 20 | Ht 72.0 in | Wt 329.0 lb

## 2014-02-18 DIAGNOSIS — I10 Essential (primary) hypertension: Secondary | ICD-10-CM

## 2014-02-18 DIAGNOSIS — J45998 Other asthma: Secondary | ICD-10-CM

## 2014-02-18 DIAGNOSIS — E785 Hyperlipidemia, unspecified: Secondary | ICD-10-CM

## 2014-02-18 DIAGNOSIS — IMO0002 Reserved for concepts with insufficient information to code with codable children: Secondary | ICD-10-CM

## 2014-02-18 DIAGNOSIS — E1165 Type 2 diabetes mellitus with hyperglycemia: Secondary | ICD-10-CM

## 2014-02-18 LAB — HEMOGLOBIN A1C: HEMOGLOBIN A1C: 7.6 % — AB (ref 4.6–6.5)

## 2014-02-18 MED ORDER — GLUCOSE BLOOD VI STRP: ORAL_STRIP | Status: DC

## 2014-02-18 MED ORDER — ALBUTEROL SULFATE HFA 108 (90 BASE) MCG/ACT IN AERS: 2.0000 | INHALATION_SPRAY | Freq: Four times a day (QID) | RESPIRATORY_TRACT | Status: DC | PRN

## 2014-02-18 NOTE — Progress Notes (Signed)
OFFICE VISIT  02/18/2014   CC:  Chief Complaint  Patient presents with  . Follow-up  . Shoulder Pain    left shoulder    HPI:    Patient is a 48 y.o. Caucasian male who presents for 2 mo f/u DM 2, HTN, hyperlipidemia. After last routine f/u visit we added a statin and increased his insulin dosing. Glucoses: "pretty good".  Lost his log book but glucometer says: 182 at 1:15 yest, 240 at 10:30 pm. Seems to have some sugars in 70s-90s in early afternoon.   Taking 60 of 70/30 in morning and 40 of 70/30 in evening.  He does not use advair except when he feels wheezing--takes a few puffs and says it helps.  Uses no neb sol'n at this time. Uses fan at night and says this helps him breath so he is not using his nocturnal oxygen supplement anymore.  Left shoulder pain lately: he'll make appt to come back and let me evaluate this separately.  ROS: recent diarrhea illness--resolved.  This caused him to miss his eye appt. No fevers, no dizziness, no focal weakness.  No recent SOB, wheezing, or coughing.   Past Medical History  Diagnosis Date  . Persistent asthma     Never smoker  . Diabetes mellitus     Type II  . Hypertension   . Chronic low back pain     Dr. Hardin Negus is pain mgmt MD  . Stable angina     nl myoview 12/07  followed by Dr Johnsie Cancel  . GERD (gastroesophageal reflux disease)   . Peripheral neuropathy   . OSA (obstructive sleep apnea)     on nighttime home O2 (refuses to wear CPAP because of claustrophobia)  . B12 deficiency     Past Surgical History  Procedure Laterality Date  . Lumbar spine surgery      lumbar laminectomy with hardware stabilization, with ensuing arachnoiditis  . Urethral stricture dilatation  1996 and 2004.    Laser surgery.    Outpatient Prescriptions Prior to Visit  Medication Sig Dispense Refill  . atorvastatin (LIPITOR) 40 MG tablet Take 1 tablet (40 mg total) by mouth daily. 30 tablet 3  . Cholecalciferol (DIALYVITE VITAMIN D 5000) 5000  UNITS capsule Take 5,000 Units by mouth daily.      . diazepam (VALIUM) 5 MG tablet Take 5 mg by mouth every 6 (six) hours as needed. As needed for anxiety and muscle spasms    . fluticasone (FLONASE) 50 MCG/ACT nasal spray 2 SPRAYS EACH NOSTRIL ONCE DAILY 16 g 1  . furosemide (LASIX) 20 MG tablet TAKE 1 TABLET BY MOUTH EVERY MORNING AS NEEDED FOR SWELLING 90 tablet 0  . insulin NPH-regular Human (NOVOLIN 70/30) (70-30) 100 UNIT/ML injection 60 Units SQ with breakfast and 30 Units SQ with supper 5 vial 11  . lisinopril (PRINIVIL,ZESTRIL) 5 MG tablet Take 1 tablet (5 mg total) by mouth daily. 30 tablet 3  . metFORMIN (GLUCOPHAGE) 1000 MG tablet Take 1 tablet (1,000 mg total) by mouth 2 (two) times daily with a meal. 60 tablet 11  . metoprolol (LOPRESSOR) 50 MG tablet TAKE 1 TABLET BY MOUTH TWICE A DAY 180 tablet 1  . morphine (MS CONTIN) 30 MG 12 hr tablet Take 30 mg by mouth every 8 (eight) hours.     . Multiple Vitamin (MULTIVITAMIN) tablet Take 1 tablet by mouth daily.      . nitroGLYCERIN (NITROSTAT) 0.4 MG SL tablet Place 1 tablet (0.4 mg total) under  the tongue every 5 (five) minutes as needed. 30 tablet 1  . ranitidine (ZANTAC) 150 MG tablet Take 150 mg by mouth daily.      Marland Kitchen venlafaxine (EFFEXOR) 75 MG tablet Take 75 mg by mouth 2 (two) times daily.    Marland Kitchen albuterol (PROVENTIL) (2.5 MG/3ML) 0.083% nebulizer solution Take 2.5 mg by nebulization every 6 (six) hours as needed for wheezing. 75 mL 12  . Fluticasone-Salmeterol (ADVAIR DISKUS) 250-50 MCG/DOSE AEPB Inhale 1 puff into the lungs 2 (two) times daily. 60 each 2  . Insulin Pen Needle (PEN NEEDLES 31GX5/16") 31G X 8 MM MISC Use as directed once a day for medication injection 100 each 1  . ipratropium (ATROVENT) 0.02 % nebulizer solution Take 2.5 mLs (500 mcg total) by nebulization 4 (four) times daily. 75 mL 12  . glucose blood (ONE TOUCH TEST STRIPS) test strip 1 each by Other route as needed. Use as instructed to check blood sugar before  breakfast, before you go to sleep, and if you develop symptoms of lethargy or confusion      No facility-administered medications prior to visit.    Allergies  Allergen Reactions  . Ace Inhibitors     Kidney failure  . Prednisone     REACTION: Anxiety  . Septra [Sulfamethoxazole-Trimethoprim]     ROS As per HPI  PE: Blood pressure 107/72, pulse 81, temperature 97.5 F (36.4 C), temperature source Temporal, resp. rate 20, height 6' (1.829 m), weight 329 lb (149.233 kg), SpO2 96 %. Gen: Alert, well appearing.  Patient is oriented to person, place, time, and situation. CV: RRR, no m/r/g.   LUNGS: CTA bilat, nonlabored resps, good aeration in all lung fields.   LABS:  None today Recent: Lab Results  Component Value Date   HGBA1C 13.6* 11/09/2013   Lab Results  Component Value Date   CHOL 231* 11/09/2013   HDL 45.00 11/09/2013   LDLCALC 115* 08/10/2012   LDLDIRECT 166.2 11/09/2013   TRIG 275.0* 11/09/2013   CHOLHDL 5 11/09/2013     Chemistry      Component Value Date/Time   NA 133* 11/09/2013 1501   K 4.2 11/09/2013 1501   CL 91* 11/09/2013 1501   CO2 29 11/09/2013 1501   BUN 9 11/09/2013 1501   CREATININE 0.9 11/09/2013 1501   CREATININE 1.26 08/10/2012 1020      Component Value Date/Time   CALCIUM 9.2 11/09/2013 1501   ALKPHOS 87 11/09/2013 1501   AST 35 11/09/2013 1501   ALT 30 11/09/2013 1501   BILITOT 0.8 11/09/2013 1501     Lab Results  Component Value Date   TSH 2.37 11/09/2013   Lab Results  Component Value Date   WBC 16.1* 11/09/2013   HGB 16.3 11/09/2013   HCT 49.3 11/09/2013   MCV 84.1 11/09/2013   PLT 377.0 11/09/2013    IMPRESSION AND PLAN:  1) DM 2, control improving. HbA1c today. He'll reschedule the ophtho exam he missed.  2) HTN:The current medical regimen is effective;  continue present plan and medications. Lytes/cr today  3) HYperlipidemia: FLP today.  Has been on statin now x 6mo.  4) Persistent asthma: unclear  history. He is noncompliant with controller med due to cost, but sounds like he is not requiring very frequent rescue bronchodilator. Rx'd ventolin HFA.  He has home albut and atrov neb sol'n.  I'll take advair off his med list for now.  An After Visit Summary was printed and given to the patient.  FOLLOW UP: Return for appt at pt's convenience to discuss left shoulder pain.

## 2014-02-18 NOTE — Addendum Note (Signed)
Addended by: Ralph Dowdy on: 02/18/2014 02:10 PM   Modules accepted: Orders

## 2014-02-18 NOTE — Progress Notes (Signed)
Pre visit review using our clinic review tool, if applicable. No additional management support is needed unless otherwise documented below in the visit note. 

## 2014-02-19 LAB — LIPID PANEL
CHOL/HDL RATIO: 3
Cholesterol: 137 mg/dL (ref 0–200)
HDL: 39.2 mg/dL (ref >39.00)
LDL Cholesterol: 78 mg/dL (ref 0–99)
NONHDL: 97.8
Triglycerides: 97 mg/dL (ref 0.0–149.0)
VLDL: 19.4 mg/dL (ref 0.0–40.0)

## 2014-02-19 LAB — COMPREHENSIVE METABOLIC PANEL
ALBUMIN: 3.6 g/dL (ref 3.5–5.2)
ALT: 23 U/L (ref 0–53)
AST: 19 U/L (ref 0–37)
Alkaline Phosphatase: 77 U/L (ref 39–117)
BUN: 23 mg/dL (ref 6–23)
CHLORIDE: 104 meq/L (ref 96–112)
CO2: 27 meq/L (ref 19–32)
CREATININE: 1.1 mg/dL (ref 0.4–1.5)
Calcium: 9.1 mg/dL (ref 8.4–10.5)
GFR: 73.67 mL/min (ref >60.00)
Glucose, Bld: 226 mg/dL — ABNORMAL HIGH (ref 70–99)
Potassium: 4.5 mEq/L (ref 3.5–5.1)
Sodium: 138 mEq/L (ref 135–145)
Total Bilirubin: 0.3 mg/dL (ref 0.2–1.2)
Total Protein: 7.3 g/dL (ref 6.0–8.3)

## 2014-03-04 NOTE — Telephone Encounter (Signed)
No call back from patient.  Encounter closed.   

## 2014-03-06 ENCOUNTER — Ambulatory Visit: Payer: Medicare Other | Admitting: Family Medicine

## 2014-03-11 ENCOUNTER — Other Ambulatory Visit: Payer: Self-pay | Admitting: Family Medicine

## 2014-03-11 MED ORDER — ATORVASTATIN CALCIUM 40 MG PO TABS: 40.0000 mg | ORAL_TABLET | Freq: Every day | ORAL | Status: DC

## 2014-08-19 ENCOUNTER — Other Ambulatory Visit: Payer: Self-pay | Admitting: *Deleted

## 2014-08-19 MED ORDER — METOPROLOL TARTRATE 50 MG PO TABS: ORAL_TABLET | ORAL | Status: DC

## 2014-08-19 NOTE — Telephone Encounter (Signed)
Fax from [facility] requesting refill for Metoprolol Tart 50mg  1 tab BID. LOV 02/18/14, no up coming ov, written: 02/21/14 w/1. Rx sent for #60 w/ 0RF, need ov.

## 2014-09-16 ENCOUNTER — Encounter: Payer: Self-pay | Admitting: Family Medicine

## 2014-09-16 ENCOUNTER — Ambulatory Visit (INDEPENDENT_AMBULATORY_CARE_PROVIDER_SITE_OTHER): Payer: PPO | Admitting: Family Medicine

## 2014-09-16 VITALS — BP 130/90 | HR 82 | Temp 98.6°F | Resp 18 | Wt 321.0 lb

## 2014-09-16 DIAGNOSIS — E118 Type 2 diabetes mellitus with unspecified complications: Secondary | ICD-10-CM

## 2014-09-16 DIAGNOSIS — G4734 Idiopathic sleep related nonobstructive alveolar hypoventilation: Secondary | ICD-10-CM

## 2014-09-16 DIAGNOSIS — J4520 Mild intermittent asthma, uncomplicated: Secondary | ICD-10-CM | POA: Diagnosis not present

## 2014-09-16 LAB — HEMOGLOBIN A1C: Hgb A1c MFr Bld: 9.6 % — ABNORMAL HIGH (ref 4.6–6.5)

## 2014-09-16 NOTE — Progress Notes (Signed)
Pre visit review using our clinic review tool, if applicable. No additional management support is needed unless otherwise documented below in the visit note. 

## 2014-09-16 NOTE — Patient Instructions (Signed)
Reschedule an office visit with your eye doctor so you can get diabetic retinopathy screening.

## 2014-09-16 NOTE — Progress Notes (Signed)
OFFICE VISIT  09/16/2014   CC:  Chief Complaint  Patient presents with  . Sleep Apnea    Discuss d/c cpap machine  . Follow-up   HPI:    Patient is a 49 y.o. Caucasian male who presents for a f/u of his OSA and re-evaluation of his home oxygen use/needs.  Also f/u DM 2.  I last saw him 02/18/14.  At that time his A1c had dropped from 13.6% to 7.6%.    DM: checks glucose at home "somewhat" but he can't be more specific.  Cost of supplies is an issue. Says he has had "maybe 2) episodes of low glucose, was able to get up and eat something and glucose came up. Takes his insulin as rx'd: Novolin 70/30, 55 U qAM and 35 U qPM.  OSA/oxygen use:  He has been dx'd with OSA by sleep study but has never been able to tolerate CPAP machine.  Used home oxygen for years, but says the last 3 yrs he has not slept in his bed--has slept in recliner with ceiling fan above him and says he breaths fine and does not use his oxygen.  Occ he uses his oxygen if he sleeps lying flat on his back.  He occ uses oxygen after he goes outside in the heat and feels sob (none yet this year).   Has some intermittent asthma by history: has albuterol inhaler that he says helps when he has wheezing (once every couple months).    Past Medical History  Diagnosis Date  . Persistent asthma     Never smoker  . Diabetes mellitus     Type II  . Hypertension   . Chronic low back pain     Dr. Hardin Negus is pain mgmt MD  . Stable angina     nl myoview 12/07  followed by Dr Johnsie Cancel  . GERD (gastroesophageal reflux disease)   . Peripheral neuropathy   . OSA (obstructive sleep apnea)     on nighttime home O2 (refuses to wear CPAP because of claustrophobia)  . B12 deficiency     Past Surgical History  Procedure Laterality Date  . Lumbar spine surgery      lumbar laminectomy with hardware stabilization, with ensuing arachnoiditis  . Urethral stricture dilatation  1996 and 2004.    Laser surgery.    Outpatient Prescriptions  Prior to Visit  Medication Sig Dispense Refill  . albuterol (VENTOLIN HFA) 108 (90 BASE) MCG/ACT inhaler Inhale 2 puffs into the lungs every 6 (six) hours as needed for wheezing or shortness of breath. 1 Inhaler 1  . atorvastatin (LIPITOR) 40 MG tablet Take 1 tablet (40 mg total) by mouth daily. 30 tablet 3  . Cholecalciferol (DIALYVITE VITAMIN D 5000) 5000 UNITS capsule Take 5,000 Units by mouth daily.      . diazepam (VALIUM) 5 MG tablet Take 5 mg by mouth every 6 (six) hours as needed. As needed for anxiety and muscle spasms    . fluticasone (FLONASE) 50 MCG/ACT nasal spray 2 SPRAYS EACH NOSTRIL ONCE DAILY 16 g 1  . Fluticasone-Salmeterol (ADVAIR DISKUS) 250-50 MCG/DOSE AEPB Inhale 1 puff into the lungs 2 (two) times daily. 60 each 2  . furosemide (LASIX) 20 MG tablet TAKE 1 TABLET BY MOUTH EVERY MORNING AS NEEDED FOR SWELLING 90 tablet 0  . glucose blood (FREESTYLE TEST STRIPS) test strip Use as instructed 100 each 12  . insulin NPH-regular Human (NOVOLIN 70/30) (70-30) 100 UNIT/ML injection 60 Units SQ with breakfast and  30 Units SQ with supper 5 vial 11  . Insulin Pen Needle (PEN NEEDLES 31GX5/16") 31G X 8 MM MISC Use as directed once a day for medication injection 100 each 1  . lisinopril (PRINIVIL,ZESTRIL) 5 MG tablet Take 1 tablet (5 mg total) by mouth daily. 30 tablet 3  . metFORMIN (GLUCOPHAGE) 1000 MG tablet Take 1 tablet (1,000 mg total) by mouth 2 (two) times daily with a meal. 60 tablet 11  . metoprolol (LOPRESSOR) 50 MG tablet TAKE 1 TABLET BY MOUTH TWICE A DAY 60 tablet 0  . morphine (MS CONTIN) 30 MG 12 hr tablet Take 30 mg by mouth every 8 (eight) hours.     . Multiple Vitamin (MULTIVITAMIN) tablet Take 1 tablet by mouth daily.      . nitroGLYCERIN (NITROSTAT) 0.4 MG SL tablet Place 1 tablet (0.4 mg total) under the tongue every 5 (five) minutes as needed. 30 tablet 1  . ranitidine (ZANTAC) 150 MG tablet Take 150 mg by mouth daily.      Marland Kitchen venlafaxine (EFFEXOR) 75 MG tablet Take  75 mg by mouth 2 (two) times daily.    Marland Kitchen albuterol (PROVENTIL) (2.5 MG/3ML) 0.083% nebulizer solution Take 2.5 mg by nebulization every 6 (six) hours as needed for wheezing. 75 mL 12  . ipratropium (ATROVENT) 0.02 % nebulizer solution Take 2.5 mLs (500 mcg total) by nebulization 4 (four) times daily. 75 mL 12   No facility-administered medications prior to visit.    Allergies  Allergen Reactions  . Ace Inhibitors     Kidney failure  . Prednisone     REACTION: Anxiety  . Septra [Sulfamethoxazole-Trimethoprim]     ROS As per HPI  PE: Blood pressure 130/90, pulse 82, temperature 98.6 F (37 C), temperature source Oral, resp. rate 18, weight 321 lb (145.605 kg), SpO2 99 %. Room air, at rest. Gen: Alert, well appearing.  Patient is oriented to person, place, time, and situation. CV: RRR, no m/r/g.   LUNGS: CTA bilat, nonlabored resps, good aeration in all lung fields. EXT: no clubbing, cyanosis, or edema.  Slight diffuse superficial flaking of skin of LL's  LABS:  Lab Results  Component Value Date   TSH 2.37 11/09/2013   Lab Results  Component Value Date   WBC 16.1* 11/09/2013   HGB 16.3 11/09/2013   HCT 49.3 11/09/2013   MCV 84.1 11/09/2013   PLT 377.0 11/09/2013   Lab Results  Component Value Date   CREATININE 1.1 02/18/2014   BUN 23 02/18/2014   NA 138 02/18/2014   K 4.5 02/18/2014   CL 104 02/18/2014   CO2 27 02/18/2014   Lab Results  Component Value Date   ALT 23 02/18/2014   AST 19 02/18/2014   ALKPHOS 77 02/18/2014   BILITOT 0.3 02/18/2014   Lab Results  Component Value Date   CHOL 137 02/18/2014   Lab Results  Component Value Date   HDL 39.20 02/18/2014   Lab Results  Component Value Date   LDLCALC 78 02/18/2014   Lab Results  Component Value Date   TRIG 97.0 02/18/2014   Lab Results  Component Value Date   CHOLHDL 3 02/18/2014   Lab Results  Component Value Date   HGBA1C 7.6* 02/18/2014    IMPRESSION AND PLAN:  1) DM 2,  noncompliant with monitoring and diet but compliant with insulins. Recheck A1c today. He was reminded to RESCHEDULE his diab retinopathy screening exam with his eye MD.  2) OSA, with hx of nocturnal  hypoxemia. Needs retesting of overnight oximetry to see if he still qualifies for oxygen at home while sleeping. He is intolerant of CPAP. Ordered overnight oximetry with [facility] today.  3) Mild intermittent asthma: The current medical regimen is effective;  continue present plan and medications.  An After Visit Summary was printed and given to the patient.  FOLLOW UP: Return in about 4 months (around 01/16/2015) for routine chronic illness f/u (30 min).

## 2014-09-18 ENCOUNTER — Other Ambulatory Visit: Payer: Self-pay | Admitting: Family Medicine

## 2014-09-18 MED ORDER — ATORVASTATIN CALCIUM 40 MG PO TABS: 40.0000 mg | ORAL_TABLET | Freq: Every day | ORAL | Status: DC

## 2014-09-19 ENCOUNTER — Other Ambulatory Visit: Payer: Self-pay | Admitting: *Deleted

## 2014-09-19 MED ORDER — METOPROLOL TARTRATE 50 MG PO TABS: ORAL_TABLET | ORAL | Status: DC

## 2014-09-19 NOTE — Telephone Encounter (Signed)
RF request for Metoprolol LOV: 09/16/14 due for f/u in 4 months Next ov: None Last written: 5/16/ #60 w/ 0RF

## 2014-09-27 ENCOUNTER — Telehealth: Payer: Self-pay | Admitting: Family Medicine

## 2014-09-27 NOTE — Telephone Encounter (Signed)
Pts wife advised and stated that they have not took the O2 yet.

## 2014-09-27 NOTE — Telephone Encounter (Signed)
Pt would like to know what test results where on his O2 testing. The oxygen company wants to take oxygen out of home and pts still needs it. Please let pt. Know what they need to do.

## 2014-09-27 NOTE — Telephone Encounter (Signed)
This needs to go to dr mcG. pls let pt know he will be called next week. Is O2 going to be taken out before then?

## 2014-10-04 ENCOUNTER — Telehealth: Payer: Self-pay | Admitting: Family Medicine

## 2014-10-04 NOTE — Telephone Encounter (Signed)
Pls notify pt that his oxygen testing was normal.  He does not qualify for home oxygen and home health can come pick it up.-thx

## 2014-10-04 NOTE — Telephone Encounter (Signed)
Left detailed message on home vm, okay per DPR.  

## 2014-10-10 ENCOUNTER — Telehealth: Payer: Self-pay | Admitting: *Deleted

## 2014-10-10 NOTE — Telephone Encounter (Signed)
Arbie Cookey with [employer] called stating that they tried to p/u oxygen but pt would not let them and that they were told to contact us. I left message on wife cell, okay per DPR to call back in regards to this. Need to call Arbie Cookey once pt has been advised.

## 2014-10-15 NOTE — Telephone Encounter (Signed)
Pt advised and voiced understanding. FYI: Spoke to pt and he states that the reason his oxygen was normal or okay was because he slept in his recyliner with the [facility] down and fan on. He states that he has been sleeping like this for years and doesn't have to use the oxygen but if he tries to sleep laying down in his bed he can not breath. He states that he discussed this with Dr. Anitra Lauth at his office visit.

## 2014-10-15 NOTE — Telephone Encounter (Signed)
I have not heard from pt. Spoke to [place] and she stated that they are scheduled to go back to p/u oxygen tank this week, if they have any problems they will give [place] a call.

## 2014-11-01 ENCOUNTER — Encounter: Payer: Self-pay | Admitting: Family Medicine

## 2014-12-10 ENCOUNTER — Other Ambulatory Visit: Payer: Self-pay | Admitting: *Deleted

## 2014-12-10 MED ORDER — METFORMIN HCL 1000 MG PO TABS: 1000.0000 mg | ORAL_TABLET | Freq: Two times a day (BID) | ORAL | Status: DC

## 2014-12-10 NOTE — Telephone Encounter (Signed)
RF request for metformin LOV: 09/16/14 Next ov:  01/16/15 Last written: 12/19/13 #60 w/ 11RF

## 2015-01-03 ENCOUNTER — Other Ambulatory Visit: Payer: Self-pay | Admitting: *Deleted

## 2015-01-03 MED ORDER — ATORVASTATIN CALCIUM 40 MG PO TABS: 40.0000 mg | ORAL_TABLET | Freq: Every day | ORAL | Status: DC

## 2015-01-03 NOTE — Telephone Encounter (Signed)
RF request for atorvastatin LOV: 09/16/14 Next ov: 01/16/15 Last written: 09/18/14 #30 w/ 3RF

## 2015-01-16 ENCOUNTER — Ambulatory Visit: Payer: PPO | Admitting: Family Medicine

## 2015-01-21 ENCOUNTER — Telehealth: Payer: Self-pay | Admitting: *Deleted

## 2015-01-21 MED ORDER — METOPROLOL TARTRATE 50 MG PO TABS: ORAL_TABLET | ORAL | Status: DC

## 2015-01-21 NOTE — Telephone Encounter (Signed)
Tried calling pt NA and unable to leave a message.  

## 2015-01-21 NOTE — Telephone Encounter (Signed)
RF request for metoprolol  LOV: 09/16/14 Next ov: None Last written: 09/19/14 #60 w/ 3RF  Pt needs ov will call to have pt schedule.

## 2015-01-31 NOTE — Telephone Encounter (Signed)
Pts wife advised and voiced understanding, okay per DPR. Apt made for 02/07/15 at 11:30am.

## 2015-02-07 ENCOUNTER — Ambulatory Visit: Payer: Self-pay | Admitting: Family Medicine

## 2015-02-19 ENCOUNTER — Other Ambulatory Visit: Payer: Self-pay | Admitting: *Deleted

## 2015-02-19 NOTE — Telephone Encounter (Signed)
RF request for metoprolol LOV: 09/16/14 Next ov: None- has cancelled last two f/u ov Last written: 01/21/15 #60 w/ 0RF Please advise. Thanks.

## 2015-02-20 MED ORDER — METFORMIN HCL 1000 MG PO TABS: 1000.0000 mg | ORAL_TABLET | Freq: Two times a day (BID) | ORAL | Status: DC

## 2015-02-20 NOTE — Telephone Encounter (Signed)
I'll authorize RF x 1 mo but pt must come in for 30 min f/u DM visit before any FURTHER RF's can be done.-thx

## 2015-02-20 NOTE — Telephone Encounter (Signed)
Left message for pt to call back. Rx was sent with 3RF, so will advise pt to f/u in the next 3 months before any further refills can be done.

## 2015-03-11 NOTE — Telephone Encounter (Signed)
Tried calling NA and unable to leave a message.  

## 2015-03-12 ENCOUNTER — Other Ambulatory Visit: Payer: Self-pay | Admitting: *Deleted

## 2015-03-12 MED ORDER — GLUCOSE BLOOD VI STRP: ORAL_STRIP | Status: DC

## 2015-03-12 NOTE — Telephone Encounter (Signed)
RF request for test strips LOV: 09/16/14 Next ov: None Last written: 02/18/14 #100 w/ 12RF

## 2015-03-24 NOTE — Telephone Encounter (Signed)
Left detailed message on home vm, okay per DPR.  

## 2015-04-17 DIAGNOSIS — M961 Postlaminectomy syndrome, not elsewhere classified: Secondary | ICD-10-CM | POA: Diagnosis not present

## 2015-04-17 DIAGNOSIS — Z79891 Long term (current) use of opiate analgesic: Secondary | ICD-10-CM | POA: Diagnosis not present

## 2015-04-17 DIAGNOSIS — G894 Chronic pain syndrome: Secondary | ICD-10-CM | POA: Diagnosis not present

## 2015-04-17 DIAGNOSIS — E1142 Type 2 diabetes mellitus with diabetic polyneuropathy: Secondary | ICD-10-CM | POA: Diagnosis not present

## 2015-06-12 DIAGNOSIS — M961 Postlaminectomy syndrome, not elsewhere classified: Secondary | ICD-10-CM | POA: Diagnosis not present

## 2015-06-12 DIAGNOSIS — Z79891 Long term (current) use of opiate analgesic: Secondary | ICD-10-CM | POA: Diagnosis not present

## 2015-06-12 DIAGNOSIS — G894 Chronic pain syndrome: Secondary | ICD-10-CM | POA: Diagnosis not present

## 2015-06-12 DIAGNOSIS — E1142 Type 2 diabetes mellitus with diabetic polyneuropathy: Secondary | ICD-10-CM | POA: Diagnosis not present

## 2015-09-08 DIAGNOSIS — M961 Postlaminectomy syndrome, not elsewhere classified: Secondary | ICD-10-CM | POA: Diagnosis not present

## 2015-09-08 DIAGNOSIS — E1142 Type 2 diabetes mellitus with diabetic polyneuropathy: Secondary | ICD-10-CM | POA: Diagnosis not present

## 2015-09-08 DIAGNOSIS — G894 Chronic pain syndrome: Secondary | ICD-10-CM | POA: Diagnosis not present

## 2015-09-08 DIAGNOSIS — Z79891 Long term (current) use of opiate analgesic: Secondary | ICD-10-CM | POA: Diagnosis not present

## 2015-10-20 ENCOUNTER — Ambulatory Visit: Payer: PPO | Admitting: Family Medicine

## 2015-10-20 ENCOUNTER — Ambulatory Visit (INDEPENDENT_AMBULATORY_CARE_PROVIDER_SITE_OTHER): Payer: PPO | Admitting: Family Medicine

## 2015-10-20 ENCOUNTER — Encounter: Payer: Self-pay | Admitting: Family Medicine

## 2015-10-20 VITALS — BP 136/92 | HR 106 | Temp 98.4°F | Resp 16 | Ht 72.0 in | Wt 310.5 lb

## 2015-10-20 DIAGNOSIS — I10 Essential (primary) hypertension: Secondary | ICD-10-CM

## 2015-10-20 DIAGNOSIS — E785 Hyperlipidemia, unspecified: Secondary | ICD-10-CM | POA: Diagnosis not present

## 2015-10-20 DIAGNOSIS — E118 Type 2 diabetes mellitus with unspecified complications: Secondary | ICD-10-CM | POA: Diagnosis not present

## 2015-10-20 LAB — LIPID PANEL
CHOLESTEROL: 215 mg/dL — AB (ref 0–200)
HDL: 46.2 mg/dL (ref >39.00)
LDL CALC: 135 mg/dL — AB (ref 0–99)
NonHDL: 168.3
Total CHOL/HDL Ratio: 5
Triglycerides: 166 mg/dL — ABNORMAL HIGH (ref 0.0–149.0)
VLDL: 33.2 mg/dL (ref 0.0–40.0)

## 2015-10-20 LAB — COMPREHENSIVE METABOLIC PANEL
ALBUMIN: 3.9 g/dL (ref 3.5–5.2)
ALT: 19 U/L (ref 0–53)
AST: 17 U/L (ref 0–37)
Alkaline Phosphatase: 87 U/L (ref 39–117)
BUN: 13 mg/dL (ref 6–23)
CHLORIDE: 93 meq/L — AB (ref 96–112)
CO2: 29 mEq/L (ref 19–32)
CREATININE: 0.81 mg/dL (ref 0.40–1.50)
Calcium: 9.4 mg/dL (ref 8.4–10.5)
GFR: 107.44 mL/min (ref >60.00)
Glucose, Bld: 290 mg/dL — ABNORMAL HIGH (ref 70–99)
Potassium: 4.1 mEq/L (ref 3.5–5.1)
SODIUM: 133 meq/L — AB (ref 135–145)
TOTAL PROTEIN: 7.6 g/dL (ref 6.0–8.3)
Total Bilirubin: 0.7 mg/dL (ref 0.2–1.2)

## 2015-10-20 LAB — HEMOGLOBIN A1C: Hgb A1c MFr Bld: 12.1 % — ABNORMAL HIGH (ref 4.6–6.5)

## 2015-10-20 MED ORDER — METOPROLOL TARTRATE 50 MG PO TABS: ORAL_TABLET | ORAL | Status: DC

## 2015-10-20 MED ORDER — LISINOPRIL 5 MG PO TABS: 5.0000 mg | ORAL_TABLET | Freq: Every day | ORAL | Status: DC

## 2015-10-20 NOTE — Progress Notes (Signed)
Pre visit review using our clinic review tool, if applicable. No additional management support is needed unless otherwise documented below in the visit note. 

## 2015-10-20 NOTE — Progress Notes (Signed)
OFFICE VISIT  10/20/2015   CC:  Chief Complaint  Patient presents with  . Follow-up    Pt is fasting.    HPI:    Patient is a 50 y.o. Caucasian male who presents for f/u DM 2 and HTN. It has been over a year since I last saw him. At that time I increased his novolin 70/30 to 60 U qAM and 40 U qPM He actually takes 70 U qAM and 50 U qPM.  He then admits that he varies his dosing depending on what glucoses are.  FEET: "feet have always been numb since i hurt my back".  No tingling or burning. No hx foot ulcer.  BP: occ checks bp at [facility] and says it is up sometimes, esp if hurting. Says it is high at Dr. Hardin Negus, pain mgmt office.  Fasting gluc: runs between 100 and 200.   Later in the day: varies, sounds like high a lot--near 400.  Says it varies with his pain level a lot.  He says he stopped his atorvastatin b/c he says it caused left arm pain.  He tried taking it qod and this didn't help the side effect abate much. Stopped metformin b/c he says it caused diarrhea.   Past Medical History  Diagnosis Date  . Persistent asthma     Never smoker  . Diabetes mellitus     Type II  . Hypertension   . Chronic low back pain     Dr. Hardin Negus is pain mgmt MD  . Stable angina ([place])     nl myoview 12/07  followed by Dr Johnsie Cancel  . GERD (gastroesophageal reflux disease)   . Peripheral neuropathy ([place])   . OSA (obstructive sleep apnea)     on nighttime home O2 (refuses to wear CPAP because of claustrophobia)  . B12 deficiency     Past Surgical History  Procedure Laterality Date  . Lumbar spine surgery      lumbar laminectomy with hardware stabilization, with ensuing arachnoiditis  . Urethral stricture dilatation  1996 and 2004.    Laser surgery.    Outpatient Prescriptions Prior to Visit  Medication Sig Dispense Refill  . albuterol (VENTOLIN HFA) 108 (90 BASE) MCG/ACT inhaler Inhale 2 puffs into the lungs every 6 (six) hours as needed for wheezing or shortness of breath. 1  Inhaler 1  . Cholecalciferol (DIALYVITE VITAMIN D 5000) 5000 UNITS capsule Take 5,000 Units by mouth daily.      . diazepam (VALIUM) 5 MG tablet Take 5 mg by mouth every 6 (six) hours as needed. As needed for anxiety and muscle spasms    . furosemide (LASIX) 20 MG tablet TAKE 1 TABLET BY MOUTH EVERY MORNING AS NEEDED FOR SWELLING 90 tablet 0  . glucose blood (FREESTYLE TEST STRIPS) test strip Use as instructed 100 each 12  . insulin NPH-regular Human (NOVOLIN 70/30) (70-30) 100 UNIT/ML injection 60 Units SQ with breakfast and 30 Units SQ with supper 5 vial 11  . Insulin Pen Needle (PEN NEEDLES 31GX5/16") 31G X 8 MM MISC Use as directed once a day for medication injection 100 each 1  . morphine (MS CONTIN) 30 MG 12 hr tablet Take 30 mg by mouth every 8 (eight) hours.     . Multiple Vitamin (MULTIVITAMIN) tablet Take 1 tablet by mouth daily.      . nitroGLYCERIN (NITROSTAT) 0.4 MG SL tablet Place 1 tablet (0.4 mg total) under the tongue every 5 (five) minutes as needed. 30 tablet 1  .  ranitidine (ZANTAC) 150 MG tablet Take 150 mg by mouth daily.      Marland Kitchen venlafaxine (EFFEXOR) 75 MG tablet Take 75 mg by mouth 2 (two) times daily.    Marland Kitchen albuterol (PROVENTIL) (2.5 MG/3ML) 0.083% nebulizer solution Take 2.5 mg by nebulization every 6 (six) hours as needed for wheezing. 75 mL 12  . atorvastatin (LIPITOR) 40 MG tablet Take 1 tablet (40 mg total) by mouth daily. (Patient not taking: Reported on 10/20/2015) 30 tablet 11  . fluticasone (FLONASE) 50 MCG/ACT nasal spray 2 SPRAYS EACH NOSTRIL ONCE DAILY (Patient not taking: Reported on 10/20/2015) 16 g 1  . Fluticasone-Salmeterol (ADVAIR DISKUS) 250-50 MCG/DOSE AEPB Inhale 1 puff into the lungs 2 (two) times daily. (Patient not taking: Reported on 10/20/2015) 60 each 2  . ipratropium (ATROVENT) 0.02 % nebulizer solution Take 2.5 mLs (500 mcg total) by nebulization 4 (four) times daily. 75 mL 12  . lisinopril (PRINIVIL,ZESTRIL) 5 MG tablet Take 1 tablet (5 mg total) by  mouth daily. (Patient not taking: Reported on 10/20/2015) 30 tablet 3  . metFORMIN (GLUCOPHAGE) 1000 MG tablet Take 1 tablet (1,000 mg total) by mouth 2 (two) times daily with a meal. (Patient not taking: Reported on 10/20/2015) 60 tablet 3  . metoprolol (LOPRESSOR) 50 MG tablet TAKE 1 TABLET BY MOUTH TWICE A DAY (Patient not taking: Reported on 10/20/2015) 60 tablet 0   No facility-administered medications prior to visit.    Allergies  Allergen Reactions  . Ace Inhibitors     Kidney failure  . Atorvastatin Other (See Comments)    Arm pain  . Prednisone     REACTION: Anxiety  . Septra [Sulfamethoxazole-Trimethoprim]     ROS As per HPI  PE: Blood pressure 136/92, pulse 106, temperature 98.4 F (36.9 C), temperature source Oral, resp. rate 16, height 6' (1.829 m), weight 310 lb 8 oz (140.842 kg), SpO2 95 %. Gen: Alert, well appearing.  Patient is oriented to person, place, time, and situation. AFFECT: pleasant, lucid thought and speech. CV: RRR, no m/r/g.   LUNGS: CTA bilat, nonlabored resps, good aeration in all lung fields. EXT: no clubbing, cyanosis, or edema.  Foot exam - no swelling, tenderness or skin or vascular lesions. Color is pale bilat, and temperature is normal. Sensation is impaired to monofilament testing bilat all over both feet.  Peripheral pulses are palpable. Toenails are thick but otherwise normal.  Skin is very dry on feet.   LABS:  Lab Results  Component Value Date   TSH 2.37 11/09/2013   Lab Results  Component Value Date   WBC 16.1* 11/09/2013   HGB 16.3 11/09/2013   HCT 49.3 11/09/2013   MCV 84.1 11/09/2013   PLT 377.0 11/09/2013   Lab Results  Component Value Date   CREATININE 1.1 02/18/2014   BUN 23 02/18/2014   NA 138 02/18/2014   K 4.5 02/18/2014   CL 104 02/18/2014   CO2 27 02/18/2014   Lab Results  Component Value Date   ALT 23 02/18/2014   AST 19 02/18/2014   ALKPHOS 77 02/18/2014   BILITOT 0.3 02/18/2014   Lab Results  Component  Value Date   CHOL 137 02/18/2014   Lab Results  Component Value Date   HDL 39.20 02/18/2014   Lab Results  Component Value Date   LDLCALC 78 02/18/2014   Lab Results  Component Value Date   TRIG 97.0 02/18/2014   Lab Results  Component Value Date   CHOLHDL 3 02/18/2014  Lab Results  Component Value Date   HGBA1C 9.6* 09/16/2014    IMPRESSION AND PLAN:  1) DM 2 :  Feet exam showed sensory impairment today but pt states this has been from his lumbar back problems. Hba1c today. Pt intolerant to metformin (diarrrhea). He is currently seeking an EYE MD that takes his insurance.  2) HTN: poor control.  Pt noncompliant with meds due to running out. Restart lopressor and lisinopril.  3) Hyperlipidemia: intolerant of atorvastatin. Recheck FLP today and we'll choose a different statin to start him on when results are back.  An After Visit Summary was printed and given to the patient.  FOLLOW UP: Return in about 4 months (around 02/20/2016) for routine chronic illness f/u (30 min).  Signed:  Crissie Sickles, MD           10/20/2015

## 2015-10-21 ENCOUNTER — Encounter: Payer: Self-pay | Admitting: Family Medicine

## 2015-10-21 ENCOUNTER — Other Ambulatory Visit: Payer: Self-pay | Admitting: Family Medicine

## 2015-10-21 MED ORDER — CANAGLIFLOZIN 100 MG PO TABS: 100.0000 mg | ORAL_TABLET | Freq: Every day | ORAL | Status: DC

## 2015-12-05 DIAGNOSIS — Z79891 Long term (current) use of opiate analgesic: Secondary | ICD-10-CM | POA: Diagnosis not present

## 2015-12-05 DIAGNOSIS — M961 Postlaminectomy syndrome, not elsewhere classified: Secondary | ICD-10-CM | POA: Diagnosis not present

## 2015-12-05 DIAGNOSIS — G894 Chronic pain syndrome: Secondary | ICD-10-CM | POA: Diagnosis not present

## 2015-12-05 DIAGNOSIS — E1142 Type 2 diabetes mellitus with diabetic polyneuropathy: Secondary | ICD-10-CM | POA: Diagnosis not present

## 2016-01-13 ENCOUNTER — Other Ambulatory Visit: Payer: Self-pay | Admitting: Family Medicine

## 2016-02-09 DIAGNOSIS — M7502 Adhesive capsulitis of left shoulder: Secondary | ICD-10-CM | POA: Diagnosis not present

## 2016-02-09 DIAGNOSIS — Z79891 Long term (current) use of opiate analgesic: Secondary | ICD-10-CM | POA: Diagnosis not present

## 2016-02-09 DIAGNOSIS — K219 Gastro-esophageal reflux disease without esophagitis: Secondary | ICD-10-CM | POA: Diagnosis not present

## 2016-02-09 DIAGNOSIS — E1142 Type 2 diabetes mellitus with diabetic polyneuropathy: Secondary | ICD-10-CM | POA: Diagnosis not present

## 2016-02-09 DIAGNOSIS — G894 Chronic pain syndrome: Secondary | ICD-10-CM | POA: Diagnosis not present

## 2016-02-09 DIAGNOSIS — M961 Postlaminectomy syndrome, not elsewhere classified: Secondary | ICD-10-CM | POA: Diagnosis not present

## 2016-02-09 DIAGNOSIS — F329 Major depressive disorder, single episode, unspecified: Secondary | ICD-10-CM | POA: Diagnosis not present

## 2016-02-20 ENCOUNTER — Ambulatory Visit: Payer: PPO | Admitting: Family Medicine

## 2016-03-04 ENCOUNTER — Ambulatory Visit: Payer: PPO | Admitting: Family Medicine

## 2016-03-04 ENCOUNTER — Ambulatory Visit (INDEPENDENT_AMBULATORY_CARE_PROVIDER_SITE_OTHER): Payer: PPO | Admitting: Family Medicine

## 2016-03-04 ENCOUNTER — Encounter: Payer: Self-pay | Admitting: Family Medicine

## 2016-03-04 VITALS — BP 133/82 | HR 70 | Temp 98.4°F | Resp 16 | Ht 72.0 in | Wt 307.5 lb

## 2016-03-04 DIAGNOSIS — Z23 Encounter for immunization: Secondary | ICD-10-CM

## 2016-03-04 DIAGNOSIS — E7800 Pure hypercholesterolemia, unspecified: Secondary | ICD-10-CM

## 2016-03-04 DIAGNOSIS — I10 Essential (primary) hypertension: Secondary | ICD-10-CM | POA: Diagnosis not present

## 2016-03-04 DIAGNOSIS — E119 Type 2 diabetes mellitus without complications: Secondary | ICD-10-CM

## 2016-03-04 LAB — BASIC METABOLIC PANEL
BUN: 15 mg/dL (ref 6–23)
CALCIUM: 9.7 mg/dL (ref 8.4–10.5)
CO2: 30 meq/L (ref 19–32)
Chloride: 95 mEq/L — ABNORMAL LOW (ref 96–112)
Creatinine, Ser: 1.13 mg/dL (ref 0.40–1.50)
GFR: 73.05 mL/min (ref >60.00)
GLUCOSE: 424 mg/dL — AB (ref 70–99)
Potassium: 4.9 mEq/L (ref 3.5–5.1)
SODIUM: 135 meq/L (ref 135–145)

## 2016-03-04 LAB — HEMOGLOBIN A1C: Hgb A1c MFr Bld: 11.7 % — ABNORMAL HIGH (ref 4.6–6.5)

## 2016-03-04 MED ORDER — INSULIN NPH ISOPHANE & REGULAR (70-30) 100 UNIT/ML ~~LOC~~ SUSP: SUBCUTANEOUS | 11 refills | Status: DC

## 2016-03-04 NOTE — Progress Notes (Signed)
OFFICE VISIT  03/04/2016   CC:  Chief Complaint  Patient presents with  . Follow-up    Pt is fasting.     HPI:    Patient is a 50 y.o. Caucasian male who presents for 4 mo f/u DM 2, HTN, and hyperlipidemia. Last visit I started him on invokana 100 mg qd and increased his novolin 70/30 to 75 U qAM and 55 U qPM.  Unfortunately, his sister died of Breast cancer since I saw him last. He has been bothered by teeth pain lately, has no dentist.  DM: Says invokana is too expensive and he won't take it anymore.  Taking insulin 50 U qAM and 40 U qPM--trying to make it last longer.  Sounds like there was a time when he did take the higher dose I rx'd after last visit. Vaguely describes glucoses as being better lately--somewhere in 150-250 range.  BPs: overall, they've been good when checked outside office.  HLD: he is simply not in a financial position to start a new med for this right now.         Past Medical History:  Diagnosis Date  . B12 deficiency   . Chronic low back pain    Dr. Hardin Negus is pain mgmt MD  . Diabetes mellitus with complication ([place])   . GERD (gastroesophageal reflux disease)   . Hypertension   . OSA (obstructive sleep apnea)    on nighttime home O2 (refuses to wear CPAP because of claustrophobia)  . 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 Persistent asthma    Never smoker  . Stable angina ([facility])    nl myoview 12/07  followed by Dr Johnsie Cancel    Past Surgical History:  Procedure Laterality Date  . LUMBAR SPINE SURGERY     lumbar laminectomy with hardware stabilization, with ensuing arachnoiditis  . [place] and 2004.   Laser surgery.    Outpatient Medications Prior to Visit  Medication Sig Dispense Refill  . albuterol (VENTOLIN HFA) 108 (90 BASE) MCG/ACT inhaler Inhale 2 puffs into the lungs every 6 (six) hours as needed for wheezing or shortness of breath. 1 Inhaler 1  . atorvastatin  (LIPITOR) 40 MG tablet TAKE 1 TABLET (40 MG TOTAL) BY MOUTH DAILY. 30 tablet 3  . canagliflozin (INVOKANA) 100 MG TABS tablet Take 1 tablet (100 mg total) by mouth daily before breakfast. 30 tablet 5  . Cholecalciferol (DIALYVITE VITAMIN D 5000) 5000 UNITS capsule Take 5,000 Units by mouth daily.      . diazepam (VALIUM) 5 MG tablet Take 5 mg by mouth every 6 (six) hours as needed. As needed for anxiety and muscle spasms    . furosemide (LASIX) 20 MG tablet TAKE 1 TABLET BY MOUTH EVERY MORNING AS NEEDED FOR SWELLING 90 tablet 0  . glucose blood (FREESTYLE TEST STRIPS) test strip Use as instructed 100 each 12  . insulin NPH-regular Human (NOVOLIN 70/30) (70-30) 100 UNIT/ML injection 60 Units SQ with breakfast and 30 Units SQ with supper 5 vial 11  . Insulin Pen Needle (PEN NEEDLES 31GX5/16") 31G X 8 MM MISC Use as directed once a day for medication injection 100 each 1  . lisinopril (PRINIVIL,ZESTRIL) 5 MG tablet Take 1 tablet (5 mg total) by mouth daily. 30 tablet 12  . metoprolol (LOPRESSOR) 50 MG tablet TAKE 1 TABLET BY MOUTH TWICE A DAY 60 tablet 12  . morphine (MS CONTIN) 30 MG 12 hr tablet  Take 30 mg by mouth every 8 (eight) hours.     . Multiple Vitamin (MULTIVITAMIN) tablet Take 1 tablet by mouth daily.      . nitroGLYCERIN (NITROSTAT) 0.4 MG SL tablet Place 1 tablet (0.4 mg total) under the tongue every 5 (five) minutes as needed. 30 tablet 1  . ranitidine (ZANTAC) 150 MG tablet Take 150 mg by mouth daily.      Marland Kitchen venlafaxine XR (EFFEXOR-XR) 150 MG 24 hr capsule Take 1 capsule by mouth 2 (two) times daily.  3   No facility-administered medications prior to visit.     Allergies  Allergen Reactions  . Ace Inhibitors     Kidney failure  . Atorvastatin Other (See Comments)    Arm pain  . Prednisone     REACTION: Anxiety  . Septra [Sulfamethoxazole-Trimethoprim]   . Metformin And Related Diarrhea    ROS As per HPI  PE: Blood pressure 133/82, pulse 70, temperature 98.4 F (36.9  C), temperature source Oral, resp. rate 16, height 6' (1.829 m), weight (!) 307 lb 8 oz (139.5 kg), SpO2 98 %. Gen: Alert, well appearing.  Patient is oriented to person, place, time, and situation. CV: RRR, no m/r/g.   LUNGS: CTA bilat, nonlabored resps, good aeration in all lung fields. EXT: 1+ pitting edema in both LL's  LABS:  Lab Results  Component Value Date   TSH 2.37 11/09/2013   Lab Results  Component Value Date   WBC 16.1 (H) 11/09/2013   HGB 16.3 11/09/2013   HCT 49.3 11/09/2013   MCV 84.1 11/09/2013   PLT 377.0 11/09/2013   Lab Results  Component Value Date   CREATININE 0.81 10/20/2015   BUN 13 10/20/2015   NA 133 (L) 10/20/2015   K 4.1 10/20/2015   CL 93 (L) 10/20/2015   CO2 29 10/20/2015   Lab Results  Component Value Date   ALT 19 10/20/2015   AST 17 10/20/2015   ALKPHOS 87 10/20/2015   BILITOT 0.7 10/20/2015   Lab Results  Component Value Date   CHOL 215 (H) 10/20/2015   Lab Results  Component Value Date   HDL 46.20 10/20/2015   Lab Results  Component Value Date   LDLCALC 135 (H) 10/20/2015   Lab Results  Component Value Date   TRIG 166.0 (H) 10/20/2015   Lab Results  Component Value Date   CHOLHDL 5 10/20/2015   Lab Results  Component Value Date   HGBA1C 12.1 (H) 10/20/2015    IMPRESSION AND PLAN:  1) DM 2, insulin-requiring.   Patient noncompliance with meds due to financial constraints. Control has been poor. Check A1c today. He'll try to stay on the invokana and he'll try to dose the novolin 70/30 as I rx'd last visit.  2) HTN; The current medical regimen is effective;  continue present plan and medications. BMET today.  3) Hyperlipidemia: intolerant of atorvastatin. Financial constraints at this time prevent [place] from starting a trial of different statin. At next f/u visit he will be out of the "donut hole" and perhaps he'll be open to starting one at that time.  An After Visit Summary was printed and given to the  patient.  FOLLOW UP: Return in about 3 months (around 06/02/2016) for routine chronic illness f/u.  Signed:  Crissie Sickles, MD           03/04/2016

## 2016-03-04 NOTE — Addendum Note (Signed)
Addended by: Onalee Hua on: 03/04/2016 11:22 AM   Modules accepted: Orders

## 2016-03-04 NOTE — Progress Notes (Signed)
Pre visit review using our clinic review tool, if applicable. No additional management support is needed unless otherwise documented below in the visit note. 

## 2016-04-07 DIAGNOSIS — M961 Postlaminectomy syndrome, not elsewhere classified: Secondary | ICD-10-CM | POA: Diagnosis not present

## 2016-04-07 DIAGNOSIS — Z79891 Long term (current) use of opiate analgesic: Secondary | ICD-10-CM | POA: Diagnosis not present

## 2016-04-07 DIAGNOSIS — E1142 Type 2 diabetes mellitus with diabetic polyneuropathy: Secondary | ICD-10-CM | POA: Diagnosis not present

## 2016-04-07 DIAGNOSIS — G894 Chronic pain syndrome: Secondary | ICD-10-CM | POA: Diagnosis not present

## 2016-04-08 ENCOUNTER — Other Ambulatory Visit: Payer: Self-pay | Admitting: *Deleted

## 2016-04-08 MED ORDER — GLUCOSE BLOOD VI STRP: ORAL_STRIP | 12 refills | Status: DC

## 2016-04-08 NOTE — Telephone Encounter (Signed)
Fax from [facility].  RF request for freestyle test strips Last written: 03/12/15 #100 w/ 12RF

## 2016-05-22 ENCOUNTER — Other Ambulatory Visit: Payer: Self-pay | Admitting: Family Medicine

## 2016-06-02 ENCOUNTER — Ambulatory Visit: Payer: Self-pay | Admitting: Family Medicine

## 2016-06-16 NOTE — Progress Notes (Signed)
OFFICE VISIT  06/17/2016   CC:  Chief Complaint  Patient presents with  . Follow-up    RCI, pt is fasting.    HPI:    Patient is a 51 y.o. Caucasian male who presents for 3 mo f/u DM 2, HTN, and hyperlipidemia. L sided tooth pain, jaw feels swollen and a bit numb--for about 4d.  Currently he doesn't have a dentist but is trying to find one in his network.  No fever.  No n/v, no malaise.  DM 2: fasting 120   2H PP 150-160 at highest but most times 120.  Taking insulin only, no invokana ([place]).  HTN: No home bp monitoring.  Compliant with all bp's med.  HLD: not taking atorva daily b/c he says it hurt his L arm.  He tries to take it 2 times per week---this dosing does not affect his arm.   Past Medical History:  Diagnosis Date  . B12 deficiency   . Chronic low back pain    Dr. Hardin Negus is pain mgmt MD  . Diabetes mellitus with complication ([place])   . GERD (gastroesophageal reflux disease)   . Hypertension   . OSA (obstructive sleep apnea)    on nighttime home O2 (refuses to wear CPAP because of claustrophobia)  . 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 Persistent asthma    Never smoker  . Stable angina (HCC)    nl myoview 12/07  followed by Dr Johnsie Cancel    Past Surgical History:  Procedure Laterality Date  . LUMBAR SPINE SURGERY     lumbar laminectomy with hardware stabilization, with ensuing arachnoiditis  . [place] and 2004.   Laser surgery.    Outpatient Medications Prior to Visit  Medication Sig Dispense Refill  . albuterol (VENTOLIN HFA) 108 (90 BASE) MCG/ACT inhaler Inhale 2 puffs into the lungs every 6 (six) hours as needed for wheezing or shortness of breath. 1 Inhaler 1  . atorvastatin (LIPITOR) 40 MG tablet TAKE 1 TABLET (40 MG TOTAL) BY MOUTH DAILY. 30 tablet 3  . Cholecalciferol (DIALYVITE VITAMIN D 5000) 5000 UNITS capsule Take 5,000 Units by mouth daily.      . diazepam (VALIUM) 5 MG tablet  Take 5 mg by mouth every 6 (six) hours as needed. As needed for anxiety and muscle spasms    . furosemide (LASIX) 20 MG tablet TAKE 1 TABLET BY MOUTH EVERY MORNING AS NEEDED FOR SWELLING 90 tablet 0  . glucose blood (FREESTYLE TEST STRIPS) test strip Use as instructed 100 each 12  . insulin NPH-regular Human (NOVOLIN 70/30) (70-30) 100 UNIT/ML injection 75 Units SQ with breakfast and 55 Units SQ with supper 5 vial 11  . Insulin Pen Needle (PEN NEEDLES 31GX5/16") 31G X 8 MM MISC Use as directed once a day for medication injection 100 each 1  . lisinopril (PRINIVIL,ZESTRIL) 5 MG tablet Take 1 tablet (5 mg total) by mouth daily. 30 tablet 12  . metoprolol (LOPRESSOR) 50 MG tablet TAKE 1 TABLET BY MOUTH TWICE A DAY 60 tablet 12  . morphine (MS CONTIN) 30 MG 12 hr tablet Take 30 mg by mouth every 8 (eight) hours.     . Multiple Vitamin (MULTIVITAMIN) tablet Take 1 tablet by mouth daily.      . nitroGLYCERIN (NITROSTAT) 0.4 MG SL tablet Place 1 tablet (0.4 mg total) under the tongue every 5 (five) minutes as needed. 30 tablet 1  . ranitidine (ZANTAC) 150  MG tablet Take 150 mg by mouth daily.      Marland Kitchen venlafaxine XR (EFFEXOR-XR) 150 MG 24 hr capsule Take 1 capsule by mouth 2 (two) times daily.  3  . canagliflozin (INVOKANA) 100 MG TABS tablet Take 1 tablet (100 mg total) by mouth daily before breakfast. 30 tablet 5   No facility-administered medications prior to visit.     Allergies  Allergen Reactions  . Ace Inhibitors     Kidney failure  . Atorvastatin Other (See Comments)    Arm pain  . Prednisone     REACTION: Anxiety  . Septra [Sulfamethoxazole-Trimethoprim]   . Metformin And Related Diarrhea    ROS As per HPI  PE: Blood pressure 119/62, pulse 91, temperature 98 F (36.7 C), temperature source Oral, resp. rate 16, height 6' (1.829 m), weight (!) 327 lb (148.3 kg), SpO2 93 %. Gen: Alert, well appearing.  Patient is oriented to person, place, time, and situation. MOUTH: left mandibular  molar region with teeth in disrepair, mild gingival erythema. TTP --mild--over L mandible. CV: RRR, no m/r/g.   LUNGS: CTA bilat, nonlabored resps, good aeration in all lung fields. EXT: no clubbing, cyanosis, or edema.    LABS:  Lab Results  Component Value Date   TSH 2.37 11/09/2013   Lab Results  Component Value Date   WBC 16.1 (H) 11/09/2013   HGB 16.3 11/09/2013   HCT 49.3 11/09/2013   MCV 84.1 11/09/2013   PLT 377.0 11/09/2013   Lab Results  Component Value Date   CREATININE 1.13 03/04/2016   BUN 15 03/04/2016   NA 135 03/04/2016   K 4.9 03/04/2016   CL 95 (L) 03/04/2016   CO2 30 03/04/2016   Lab Results  Component Value Date   ALT 19 10/20/2015   AST 17 10/20/2015   ALKPHOS 87 10/20/2015   BILITOT 0.7 10/20/2015   Lab Results  Component Value Date   CHOL 215 (H) 10/20/2015   Lab Results  Component Value Date   HDL 46.20 10/20/2015   Lab Results  Component Value Date   LDLCALC 135 (H) 10/20/2015   Lab Results  Component Value Date   TRIG 166.0 (H) 10/20/2015   Lab Results  Component Value Date   CHOLHDL 5 10/20/2015   Lab Results  Component Value Date   HGBA1C 11.7 (H) 03/04/2016    IMPRESSION AND PLAN:  1) DM 2, noncompliant with diet. Home glucose report: good control. Recheck HbA1c today.  2) HTN: The current medical regimen is effective;  continue present plan and medications. Lytes/cr today.  3) Hyperlipidemia: pt only tolerates atorva twice a week, and it sounds like some weeks he doesn't do this. Fasting today so we'll recheck FLP.  AST/ALT good 10/2015.  4) L mandibular tooth infection, possible early abscess. Augmentin 875mg  bid x 10d rx'd, strongly encouraged pt to search diligently for a dentist and see one ASAP.  An After Visit Summary was printed and given to the patient.  FOLLOW UP: Return in about 3 months (around 09/17/2016) for annual CPE (fasting).  Signed:  Crissie Sickles, MD           06/17/2016

## 2016-06-17 ENCOUNTER — Encounter: Payer: Self-pay | Admitting: Family Medicine

## 2016-06-17 ENCOUNTER — Ambulatory Visit (INDEPENDENT_AMBULATORY_CARE_PROVIDER_SITE_OTHER): Payer: PPO | Admitting: Family Medicine

## 2016-06-17 VITALS — BP 119/62 | HR 91 | Temp 98.0°F | Resp 16 | Ht 72.0 in | Wt 327.0 lb

## 2016-06-17 DIAGNOSIS — E118 Type 2 diabetes mellitus with unspecified complications: Secondary | ICD-10-CM

## 2016-06-17 DIAGNOSIS — E7800 Pure hypercholesterolemia, unspecified: Secondary | ICD-10-CM | POA: Diagnosis not present

## 2016-06-17 DIAGNOSIS — Z794 Long term (current) use of insulin: Secondary | ICD-10-CM | POA: Diagnosis not present

## 2016-06-17 DIAGNOSIS — K047 Periapical abscess without sinus: Secondary | ICD-10-CM

## 2016-06-17 DIAGNOSIS — I10 Essential (primary) hypertension: Secondary | ICD-10-CM

## 2016-06-17 LAB — HEMOGLOBIN A1C: HEMOGLOBIN A1C: 10.5 % — AB (ref 4.6–6.5)

## 2016-06-17 LAB — LIPID PANEL
CHOLESTEROL: 143 mg/dL (ref 0–200)
HDL: 33.5 mg/dL — AB (ref >39.00)
LDL Cholesterol: 92 mg/dL (ref 0–99)
NonHDL: 109.5
TRIGLYCERIDES: 86 mg/dL (ref 0.0–149.0)
Total CHOL/HDL Ratio: 4
VLDL: 17.2 mg/dL (ref 0.0–40.0)

## 2016-06-17 LAB — BASIC METABOLIC PANEL
BUN: 12 mg/dL (ref 6–23)
CALCIUM: 9.2 mg/dL (ref 8.4–10.5)
CO2: 27 mEq/L (ref 19–32)
Chloride: 100 mEq/L (ref 96–112)
Creatinine, Ser: 0.89 mg/dL (ref 0.40–1.50)
GFR: 96.11 mL/min (ref >60.00)
Glucose, Bld: 369 mg/dL — ABNORMAL HIGH (ref 70–99)
POTASSIUM: 4.5 meq/L (ref 3.5–5.1)
SODIUM: 138 meq/L (ref 135–145)

## 2016-06-17 MED ORDER — AMOXICILLIN-POT CLAVULANATE 875-125 MG PO TABS: 1.0000 | ORAL_TABLET | Freq: Two times a day (BID) | ORAL | 0 refills | Status: DC

## 2016-06-17 NOTE — Progress Notes (Signed)
Pre visit review using our clinic review tool, if applicable. No additional management support is needed unless otherwise documented below in the visit note. 

## 2016-06-17 NOTE — Patient Instructions (Signed)
For leg cramps:  Buy tonic water at the grocery store: drink 2-3 ounces in morning and at bedtime. Take one over the counter Magnesium oxide 500 mg pill once daily.

## 2016-06-21 ENCOUNTER — Telehealth: Payer: Self-pay | Admitting: Family Medicine

## 2016-06-21 ENCOUNTER — Other Ambulatory Visit: Payer: Self-pay | Admitting: *Deleted

## 2016-06-21 MED ORDER — INSULIN NPH ISOPHANE & REGULAR (70-30) 100 UNIT/ML ~~LOC~~ SUSP: SUBCUTANEOUS | 0 refills | Status: DC

## 2016-06-21 NOTE — Telephone Encounter (Signed)
Patient returning call about lab results. Okay to leave detailed message on patient's home number.

## 2016-07-07 DIAGNOSIS — Z79891 Long term (current) use of opiate analgesic: Secondary | ICD-10-CM | POA: Diagnosis not present

## 2016-07-07 DIAGNOSIS — M961 Postlaminectomy syndrome, not elsewhere classified: Secondary | ICD-10-CM | POA: Diagnosis not present

## 2016-07-07 DIAGNOSIS — E1142 Type 2 diabetes mellitus with diabetic polyneuropathy: Secondary | ICD-10-CM | POA: Diagnosis not present

## 2016-07-07 DIAGNOSIS — G894 Chronic pain syndrome: Secondary | ICD-10-CM | POA: Diagnosis not present

## 2016-07-08 DIAGNOSIS — H40033 Anatomical narrow angle, bilateral: Secondary | ICD-10-CM | POA: Diagnosis not present

## 2016-07-08 DIAGNOSIS — E119 Type 2 diabetes mellitus without complications: Secondary | ICD-10-CM | POA: Diagnosis not present

## 2016-07-08 LAB — HM DIABETES EYE EXAM

## 2016-07-17 DIAGNOSIS — Z79891 Long term (current) use of opiate analgesic: Secondary | ICD-10-CM | POA: Diagnosis not present

## 2016-07-20 ENCOUNTER — Encounter: Payer: Self-pay | Admitting: Family Medicine

## 2016-09-01 DIAGNOSIS — G894 Chronic pain syndrome: Secondary | ICD-10-CM | POA: Diagnosis not present

## 2016-09-01 DIAGNOSIS — M961 Postlaminectomy syndrome, not elsewhere classified: Secondary | ICD-10-CM | POA: Diagnosis not present

## 2016-09-01 DIAGNOSIS — Z79891 Long term (current) use of opiate analgesic: Secondary | ICD-10-CM | POA: Diagnosis not present

## 2016-09-01 DIAGNOSIS — E1142 Type 2 diabetes mellitus with diabetic polyneuropathy: Secondary | ICD-10-CM | POA: Diagnosis not present

## 2016-09-16 ENCOUNTER — Encounter: Payer: Self-pay | Admitting: Family Medicine

## 2016-09-28 ENCOUNTER — Other Ambulatory Visit: Payer: Self-pay | Admitting: Family Medicine

## 2016-09-29 NOTE — Telephone Encounter (Signed)
[facility]  RF request for atorvastatin LOV: 06/17/16 Next ov: None Last written: 05/24/16 #30 w/ 3RF

## 2016-10-27 DIAGNOSIS — G894 Chronic pain syndrome: Secondary | ICD-10-CM | POA: Diagnosis not present

## 2016-10-27 DIAGNOSIS — M961 Postlaminectomy syndrome, not elsewhere classified: Secondary | ICD-10-CM | POA: Diagnosis not present

## 2016-10-27 DIAGNOSIS — Z79891 Long term (current) use of opiate analgesic: Secondary | ICD-10-CM | POA: Diagnosis not present

## 2016-10-27 DIAGNOSIS — M25551 Pain in right hip: Secondary | ICD-10-CM | POA: Diagnosis not present

## 2016-11-16 ENCOUNTER — Other Ambulatory Visit: Payer: Self-pay | Admitting: Family Medicine

## 2016-11-20 ENCOUNTER — Other Ambulatory Visit: Payer: Self-pay | Admitting: Family Medicine

## 2016-11-22 NOTE — Telephone Encounter (Signed)
[facility] 

## 2017-01-10 DIAGNOSIS — M961 Postlaminectomy syndrome, not elsewhere classified: Secondary | ICD-10-CM | POA: Diagnosis not present

## 2017-01-10 DIAGNOSIS — G894 Chronic pain syndrome: Secondary | ICD-10-CM | POA: Diagnosis not present

## 2017-01-10 DIAGNOSIS — M25551 Pain in right hip: Secondary | ICD-10-CM | POA: Diagnosis not present

## 2017-01-10 DIAGNOSIS — Z79891 Long term (current) use of opiate analgesic: Secondary | ICD-10-CM | POA: Diagnosis not present

## 2017-02-22 ENCOUNTER — Telehealth: Payer: Self-pay | Admitting: *Deleted

## 2017-02-22 MED ORDER — ATORVASTATIN CALCIUM 40 MG PO TABS: 40.0000 mg | ORAL_TABLET | Freq: Every day | ORAL | 0 refills | Status: DC

## 2017-02-22 MED ORDER — METOPROLOL TARTRATE 50 MG PO TABS: 50.0000 mg | ORAL_TABLET | Freq: Two times a day (BID) | ORAL | 0 refills | Status: DC

## 2017-02-22 NOTE — Telephone Encounter (Signed)
Will send refills for 90 day supply with 0 Rfs. Pt is over due for f/u RCI, needs office visit for more refills.

## 2017-02-28 NOTE — Telephone Encounter (Signed)
Left message for pt to call back to schedule f/u apt.

## 2017-03-02 NOTE — Telephone Encounter (Signed)
SW pts wife today, she will schedule apt for pt, okay per DPR.

## 2017-03-09 ENCOUNTER — Encounter (HOSPITAL_BASED_OUTPATIENT_CLINIC_OR_DEPARTMENT_OTHER): Payer: Self-pay | Admitting: Emergency Medicine

## 2017-03-09 ENCOUNTER — Emergency Department (HOSPITAL_BASED_OUTPATIENT_CLINIC_OR_DEPARTMENT_OTHER): Payer: No Typology Code available for payment source

## 2017-03-09 ENCOUNTER — Emergency Department (HOSPITAL_BASED_OUTPATIENT_CLINIC_OR_DEPARTMENT_OTHER)
Admission: EM | Admit: 2017-03-09 | Discharge: 2017-03-09 | Disposition: A | Discharge status: Home / Self Care | Payer: No Typology Code available for payment source | Attending: Emergency Medicine | Admitting: Emergency Medicine

## 2017-03-09 ENCOUNTER — Other Ambulatory Visit: Payer: Self-pay

## 2017-03-09 DIAGNOSIS — Z79899 Other long term (current) drug therapy: Secondary | ICD-10-CM | POA: Diagnosis not present

## 2017-03-09 DIAGNOSIS — R0789 Other chest pain: Secondary | ICD-10-CM | POA: Diagnosis not present

## 2017-03-09 DIAGNOSIS — Z794 Long term (current) use of insulin: Secondary | ICD-10-CM | POA: Insufficient documentation

## 2017-03-09 DIAGNOSIS — Y92410 Unspecified street and highway as the place of occurrence of the external cause: Secondary | ICD-10-CM | POA: Diagnosis not present

## 2017-03-09 DIAGNOSIS — I10 Essential (primary) hypertension: Secondary | ICD-10-CM | POA: Insufficient documentation

## 2017-03-09 DIAGNOSIS — S8992XA Unspecified injury of left lower leg, initial encounter: Secondary | ICD-10-CM | POA: Diagnosis not present

## 2017-03-09 DIAGNOSIS — S20219A Contusion of unspecified front wall of thorax, initial encounter: Secondary | ICD-10-CM | POA: Insufficient documentation

## 2017-03-09 DIAGNOSIS — S8002XA Contusion of left knee, initial encounter: Secondary | ICD-10-CM | POA: Diagnosis not present

## 2017-03-09 DIAGNOSIS — J449 Chronic obstructive pulmonary disease, unspecified: Secondary | ICD-10-CM | POA: Insufficient documentation

## 2017-03-09 DIAGNOSIS — M25562 Pain in left knee: Secondary | ICD-10-CM | POA: Diagnosis not present

## 2017-03-09 DIAGNOSIS — Y9389 Activity, other specified: Secondary | ICD-10-CM | POA: Diagnosis not present

## 2017-03-09 DIAGNOSIS — S299XXA Unspecified injury of thorax, initial encounter: Secondary | ICD-10-CM | POA: Diagnosis not present

## 2017-03-09 DIAGNOSIS — Y999 Unspecified external cause status: Secondary | ICD-10-CM | POA: Insufficient documentation

## 2017-03-09 DIAGNOSIS — T07XXXA Unspecified multiple injuries, initial encounter: Secondary | ICD-10-CM

## 2017-03-09 DIAGNOSIS — E119 Type 2 diabetes mellitus without complications: Secondary | ICD-10-CM | POA: Insufficient documentation
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
## 2017-03-09 NOTE — Discharge Instructions (Signed)
Return to ER with any new or worsening symptoms. Continue your current chronic pain medicine regimen.

## 2017-03-09 NOTE — ED Notes (Signed)
ED Provider at bedside. 
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
## 2017-03-09 NOTE — ED Notes (Signed)
Patient transported to X-ray 

## 2017-03-16 DIAGNOSIS — Z79891 Long term (current) use of opiate analgesic: Secondary | ICD-10-CM | POA: Diagnosis not present

## 2017-03-16 DIAGNOSIS — G894 Chronic pain syndrome: Secondary | ICD-10-CM | POA: Diagnosis not present

## 2017-03-16 DIAGNOSIS — M961 Postlaminectomy syndrome, not elsewhere classified: Secondary | ICD-10-CM | POA: Diagnosis not present

## 2017-03-16 DIAGNOSIS — M25551 Pain in right hip: Secondary | ICD-10-CM | POA: Diagnosis not present

## 2017-03-20 ENCOUNTER — Other Ambulatory Visit: Payer: Self-pay | Admitting: Family Medicine

## 2017-03-30 ENCOUNTER — Other Ambulatory Visit: Payer: Self-pay

## 2017-03-30 NOTE — Patient Outreach (Signed)
[facility]) Care Management  03/30/2017  [facility] [birth year] [id number]   Telephone Screen  Referral Date: 03/30/17 Referral Source: [employer]-HTA Referral Reason: " diabetes, member needs referral to dentist, DM educator, neurology for further eval of Arachnoiditis, cardiology to r/o CHF, member doesn't have money or glasses, needs an exercise program, there is a huge health literacy deficit present with this member"  Insurance: HTA   Outreach attempt # 1 to patient. Male answered and reported that patient was not available.      Plan: RN CM will make outreach attempt to patient within three business days.    Enzo Montgomery, RN,BSN,CCM [facility] Telephonic Care Management Coordinator Direct Phone: [phone] Toll Free: [phone] Fax: [phone]

## 2017-03-31 ENCOUNTER — Other Ambulatory Visit: Payer: Self-pay
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
## 2017-04-06 ENCOUNTER — Other Ambulatory Visit: Payer: Self-pay

## 2017-04-06 NOTE — Patient Outreach (Addendum)
[facility]) Care Management  04/06/2017  D[facility] [birth year] [id number]  Referral received 03/31/17 per telephonic care coordinator.  52 year old with history of DM, HTN, chronic back pain, GERD, OSA, tobacco use, Hyperlipidemia, depression.  RNCM called to schedule home visit. Client is not in. RNCM left HIPPA compliant message.  Plan: await return call and follow up in the next 1-3 business days if no return call.  Thea Silversmith, RN, MSN, [place] Coordinator Cell: [phone]

## 2017-04-07 ENCOUNTER — Other Ambulatory Visit: Payer: Self-pay

## 2017-04-07 NOTE — Patient Outreach (Signed)
[facility]) Care Management  04/07/2017  D[facility] Owen 1965/09/27 [id number]  Referral received 03/31/17 per telephonic care coordinator.  52 year old with history of DM, HTN, chronic back pain, GERD, OSA, tobacco use, Hyperlipidemia, depression.  RNCM called to schedule home visit. No answer. HIPPA compliant message left.   Plan: follow up next week.   Thea Silversmith, RN, MSN, [place] Coordinator Cell: [phone]

## 2017-04-08 ENCOUNTER — Ambulatory Visit: Payer: Self-pay

## 2017-04-11 ENCOUNTER — Other Ambulatory Visit: Payer: Self-pay

## 2017-04-11 NOTE — Patient Outreach (Signed)
[facility]) Care Management  04/11/2017  AVIYON HOCEVAR 09-29-1965 [id number]   Referral received 03/31/17 per telephonic care coordinator.  52 year old with history of DM, HTN, chronic back pain, GERD, OSA, tobacco use, Hyperlipidemia, depression.  RNCM called to schedule home visit. No answer. HIPPA compliant message left. This is the 3rd outreach attempt.  Plan: send outreach letter.  Thea Silversmith, RN, MSN, [place] Coordinator Cell: [phone]

## 2017-04-26 ENCOUNTER — Other Ambulatory Visit: Payer: Self-pay

## 2017-04-26 NOTE — Patient Outreach (Signed)
[facility]) Care Management  04/26/2017  KAMAAL CAST 1965/12/04 [id number]   Referral received 03/31/17 per telephonic care coordinator.  52 year old with history of DM, HTN, chronic back pain, GERD, OSA, tobacco use, Hyperlipidemia, depression.  No return call from telephonic outreaches. No return response from outreach letter.  Plan: per policy and procedure close case.  Thea Silversmith, RN, MSN, [place] Coordinator Cell: [phone]

## 2017-05-11 DIAGNOSIS — M25551 Pain in right hip: Secondary | ICD-10-CM | POA: Diagnosis not present

## 2017-05-11 DIAGNOSIS — G894 Chronic pain syndrome: Secondary | ICD-10-CM | POA: Diagnosis not present

## 2017-05-11 DIAGNOSIS — M961 Postlaminectomy syndrome, not elsewhere classified: Secondary | ICD-10-CM | POA: Diagnosis not present

## 2017-05-11 DIAGNOSIS — Z79891 Long term (current) use of opiate analgesic: Secondary | ICD-10-CM | POA: Diagnosis not present

## 2017-05-22 ENCOUNTER — Other Ambulatory Visit: Payer: Self-pay | Admitting: Family Medicine

## 2017-05-24 ENCOUNTER — Other Ambulatory Visit: Payer: Self-pay | Admitting: Family Medicine

## 2017-06-03 ENCOUNTER — Encounter: Payer: Self-pay | Admitting: Family Medicine

## 2017-06-03 DIAGNOSIS — E038 Other specified hypothyroidism: Secondary | ICD-10-CM

## 2017-06-03 HISTORY — DX: Other specified hypothyroidism: E03.8

## 2017-06-07 ENCOUNTER — Ambulatory Visit (INDEPENDENT_AMBULATORY_CARE_PROVIDER_SITE_OTHER): Payer: PPO | Admitting: Family Medicine

## 2017-06-07 ENCOUNTER — Encounter: Payer: Self-pay | Admitting: Family Medicine

## 2017-06-07 VITALS — BP 153/83 | HR 81 | Temp 98.5°F | Resp 16 | Ht 71.5 in | Wt 324.8 lb

## 2017-06-07 DIAGNOSIS — E7800 Pure hypercholesterolemia, unspecified: Secondary | ICD-10-CM | POA: Diagnosis not present

## 2017-06-07 DIAGNOSIS — Z23 Encounter for immunization: Secondary | ICD-10-CM

## 2017-06-07 DIAGNOSIS — E118 Type 2 diabetes mellitus with unspecified complications: Secondary | ICD-10-CM | POA: Diagnosis not present

## 2017-06-07 DIAGNOSIS — Z125 Encounter for screening for malignant neoplasm of prostate: Secondary | ICD-10-CM

## 2017-06-07 DIAGNOSIS — Z794 Long term (current) use of insulin: Secondary | ICD-10-CM | POA: Diagnosis not present

## 2017-06-07 DIAGNOSIS — I10 Essential (primary) hypertension: Secondary | ICD-10-CM

## 2017-06-07 DIAGNOSIS — Z0000 Encounter for general adult medical examination without abnormal findings: Secondary | ICD-10-CM

## 2017-06-07 DIAGNOSIS — E538 Deficiency of other specified B group vitamins: Secondary | ICD-10-CM | POA: Diagnosis not present

## 2017-06-07 LAB — LIPID PANEL
CHOLESTEROL: 140 mg/dL (ref 0–200)
HDL: 43.4 mg/dL (ref >39.00)
LDL Cholesterol: 78 mg/dL (ref 0–99)
NonHDL: 96.96
Total CHOL/HDL Ratio: 3
Triglycerides: 96 mg/dL (ref 0.0–149.0)
VLDL: 19.2 mg/dL (ref 0.0–40.0)

## 2017-06-07 LAB — CBC WITH DIFFERENTIAL/PLATELET
BASOS ABS: 0.2 10*3/uL — AB (ref 0.0–0.1)
Basophils Relative: 1.1 % (ref 0.0–3.0)
EOS ABS: 0.4 10*3/uL (ref 0.0–0.7)
Eosinophils Relative: 2.4 % (ref 0.0–5.0)
HEMATOCRIT: 47.3 % (ref 39.0–52.0)
HEMOGLOBIN: 15.6 g/dL (ref 13.0–17.0)
LYMPHS PCT: 23.5 % (ref 12.0–46.0)
Lymphs Abs: 3.5 10*3/uL (ref 0.7–4.0)
MCHC: 33 g/dL (ref 30.0–36.0)
MCV: 84.6 fl (ref 78.0–100.0)
Monocytes Absolute: 1.8 10*3/uL — ABNORMAL HIGH (ref 0.1–1.0)
Monocytes Relative: 12.4 % — ABNORMAL HIGH (ref 3.0–12.0)
NEUTROS ABS: 8.9 10*3/uL — AB (ref 1.4–7.7)
Neutrophils Relative %: 60.6 % (ref 43.0–77.0)
PLATELETS: 444 10*3/uL — AB (ref 150.0–400.0)
RBC: 5.6 Mil/uL (ref 4.22–5.81)
RDW: 13.4 % (ref 11.5–15.5)
WBC: 14.7 10*3/uL — AB (ref 4.0–10.5)

## 2017-06-07 LAB — COMPREHENSIVE METABOLIC PANEL
ALBUMIN: 4 g/dL (ref 3.5–5.2)
ALK PHOS: 76 U/L (ref 39–117)
ALT: 17 U/L (ref 0–53)
AST: 13 U/L (ref 0–37)
BILIRUBIN TOTAL: 0.5 mg/dL (ref 0.2–1.2)
BUN: 15 mg/dL (ref 6–23)
CO2: 30 mEq/L (ref 19–32)
CREATININE: 0.98 mg/dL (ref 0.40–1.50)
Calcium: 10.1 mg/dL (ref 8.4–10.5)
Chloride: 99 mEq/L (ref 96–112)
GFR: 85.67 mL/min (ref >60.00)
Glucose, Bld: 92 mg/dL (ref 70–99)
POTASSIUM: 4.5 meq/L (ref 3.5–5.1)
SODIUM: 140 meq/L (ref 135–145)
TOTAL PROTEIN: 7.7 g/dL (ref 6.0–8.3)

## 2017-06-07 LAB — HEMOGLOBIN A1C: Hgb A1c MFr Bld: 12 % — ABNORMAL HIGH (ref 4.6–6.5)

## 2017-06-07 LAB — PSA: PSA: 0.27 ng/mL (ref 0.10–4.00)

## 2017-06-07 LAB — VITAMIN B12: VITAMIN B 12: 258 pg/mL (ref 211–911)

## 2017-06-07 LAB — TSH: TSH: 7.01 u[IU]/mL — AB (ref 0.35–4.50)

## 2017-06-07 NOTE — Patient Instructions (Signed)

## 2017-06-07 NOTE — Progress Notes (Signed)
Office Note 06/07/2017  CC:  Chief Complaint  Patient presents with  . Annual Exam    pt is fasting.     HPI:  Dylan Owen is a 52 y.o. White male who is here for annual health maintenance exam.  He took all meds today. He is fasting.  DM 2: taking 80-90 U q supper and 60-70 U qAM. Fastings 100-110 avg.  2H PP (pt can't be clear if it is truly 2H PP): sounds like 120-150 avg. He is drinking garlic, lemon, lime, in sparkling water that has been boiled. Says it suppresses appetite, helps glucoses, helps constipation.  HTN: occ bp check at home : he is unable to be clear on actual numbers, but notes that with pain it does go up. Says he is compliant with meds.   Past Medical History:  Diagnosis Date  . B12 deficiency   . Chronic low back pain    Dr. Hardin Negus is pain mgmt MD  . Diabetes mellitus with complication ([place])   . GERD (gastroesophageal reflux disease)   . Hypertension   . OSA (obstructive sleep apnea)    on nighttime home O2 (refuses to wear CPAP because of claustrophobia)  . 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 Persistent asthma    Never smoker  . Stable angina ([facility])    nl myoview 12/07  followed by Dr Johnsie Cancel    Past Surgical History:  Procedure Laterality Date  . LUMBAR SPINE SURGERY     lumbar laminectomy with hardware stabilization, with ensuing arachnoiditis  . [place] and 2004.   Laser surgery.    Family History  Problem Relation Age of Onset  . Heart disease Other   . Hypertension Other   . Other Other        Emotional Illness  . Cancer Paternal Grandfather        prostate    Social History   Socioeconomic History  . Marital status: Married    Spouse name: Not on file  . Number of children: Not on file  . Years of education: Not on file  . Highest education level: Not on file  Social Needs  . Financial resource strain: Not on file  . Food insecurity - worry: Not  on file  . Food insecurity - inability: Not on file  . Transportation needs - medical: Not on file  . Transportation needs - non-medical: Not on file  Occupational History  . Not on file  Tobacco Use  . Smoking status: Never Smoker  . Smokeless tobacco: Current User  . Tobacco comment: dip  Substance and Sexual Activity  . Alcohol use: No  . Drug use: No  . Sexual activity: Not on file  Other Topics Concern  . Not on file  Social History Narrative   Married, 4 children.   Occupation: disabled since 2000.     Prior to disability he worked for [employer].   Orig from [place].   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  No hx of drug abuse.   Chews tobacco since age 60 yrs.   Regular exercise:  No                Outpatient Medications Prior to Visit  Medication Sig Dispense Refill  . albuterol (VENTOLIN HFA) 108 (90 BASE) MCG/ACT inhaler Inhale 2 puffs into the lungs  every 6 (six) hours as needed for wheezing or shortness of breath. 1 Inhaler 1  . atorvastatin (LIPITOR) 40 MG tablet TAKE 1 TABLET BY MOUTH EVERY DAY 90 tablet 0  . Cholecalciferol (DIALYVITE VITAMIN D 5000) 5000 UNITS capsule Take 5,000 Units by mouth daily.      . diazepam (VALIUM) 5 MG tablet Take 5 mg by mouth every 6 (six) hours as needed. As needed for anxiety and muscle spasms    . furosemide (LASIX) 20 MG tablet TAKE 1 TABLET BY MOUTH EVERY MORNING AS NEEDED FOR SWELLING 90 tablet 0  . glucose blood (FREESTYLE TEST STRIPS) test strip CHECK GLUCOSE 4 TIMES DAILY 100 each 12  . insulin NPH-regular Human (NOVOLIN 70/30) (70-30) 100 UNIT/ML injection 80 Units SQ with breakfast and 60 Units SQ with supper (Patient taking differently: 90 Units SQ with breakfast and 70 Units SQ with supper) 1 vial 0  . Insulin Pen Needle (PEN NEEDLES 31GX5/16") 31G X 8 MM MISC Use as directed once a day for medication injection 100 each 1  . lisinopril  (PRINIVIL,ZESTRIL) 5 MG tablet TAKE 1 TABLET (5 MG TOTAL) BY MOUTH DAILY. 30 tablet 0  . metoprolol tartrate (LOPRESSOR) 50 MG tablet TAKE 1 TABLET BY MOUTH TWICE A DAY 60 tablet 0  . morphine (MS CONTIN) 30 MG 12 hr tablet Take 30 mg by mouth every 8 (eight) hours.     . Multiple Vitamin (MULTIVITAMIN) tablet Take 1 tablet by mouth daily.      . nitroGLYCERIN (NITROSTAT) 0.4 MG SL tablet Place 1 tablet (0.4 mg total) under the tongue every 5 (five) minutes as needed. 30 tablet 1  . ranitidine (ZANTAC) 150 MG tablet Take 150 mg by mouth daily.      Marland Kitchen venlafaxine XR (EFFEXOR-XR) 150 MG 24 hr capsule Take 1 capsule by mouth 2 (two) times daily.  3  . amoxicillin-clavulanate (AUGMENTIN) 875-125 MG tablet Take 1 tablet by mouth 2 (two) times daily. (Patient not taking: Reported on 06/07/2017) 20 tablet 0   No facility-administered medications prior to visit.     Allergies  Allergen Reactions  . Ace Inhibitors     Kidney failure  . Atorvastatin Other (See Comments)    Arm pain  . Prednisone     REACTION: Anxiety  . Septra [Sulfamethoxazole-Trimethoprim]   . Metformin And Related Diarrhea    ROS Review of Systems  Constitutional: Negative for appetite change, chills, fatigue and fever.  HENT: Negative for congestion, dental problem, ear pain and sore throat.   Eyes: Negative for discharge, redness and visual disturbance.  Respiratory: Negative for cough, chest tightness, shortness of breath and wheezing.   Cardiovascular: Negative for chest pain, palpitations and leg swelling.  Gastrointestinal: Negative for abdominal pain, blood in stool, diarrhea, nausea and vomiting.  Genitourinary: Negative for difficulty urinating, dysuria, flank pain, frequency, hematuria and urgency.  Musculoskeletal: Negative for arthralgias, back pain, joint swelling, myalgias and neck stiffness.  Skin: Negative for pallor and rash.  Neurological: Negative for dizziness, speech difficulty, weakness and headaches.   Hematological: Negative for adenopathy. Does not bruise/bleed easily.  Psychiatric/Behavioral: Negative for confusion and sleep disturbance. The patient is not nervous/anxious.     PE; Blood pressure (!) 153/83, pulse 81, temperature 98.5 F (36.9 C), temperature source Oral, resp. rate 16, height 5' 11.5" (1.816 m), weight (!) 324 lb 12 oz (147.3 kg), SpO2 97 %. Body mass index is 44.66 kg/m.  Gen: Alert, well appearing.  Patient is oriented to person,  place, time, and situation. AFFECT: pleasant, lucid thought and speech. ENT: Ears: EACs clear, normal epithelium.  TMs with good light reflex and landmarks bilaterally.  Eyes: no injection, icteris, swelling, or exudate.  EOMI, PERRLA. Nose: no drainage or turbinate edema/swelling.  No injection or focal lesion.  Mouth: lips without lesion/swelling.  Oral mucosa pink and moist except tongue is dingy and dark brown and he has a dip of tobacco in lower lip.  Dentition int disrepair.  Oropharynx without erythema, exudate, or swelling.  Neck: supple/nontender.  No LAD, mass, or TM.  Carotid pulses 2+ bilaterally, without bruits. CV: RRR, no m/r/g.   LUNGS: CTA bilat, nonlabored resps, good aeration in all lung fields. ABD: Obese, soft, NT, ND, BS normal.  No hepatospenomegaly or mass.  No bruits. EXT: no clubbing, cyanosis, or edema.  Musculoskeletal: no joint swelling, erythema, warmth, or tenderness.  ROM of all joints intact. Skin - no sores or suspicious lesions or rashes or color changes Foot exam --no swelling, tenderness or skin or vascular lesions. Mildly pale color with mild cool temperature. Sensation is absent. Peripheral pulses are palpable. Toenails are normal. Rectal exam: negative without mass, lesions or tenderness, PROSTATE EXAM: smooth and symmetric without nodules or tenderness.    Pertinent labs:  Lab Results  Component Value Date   TSH 2.37 11/09/2013   Lab Results  Component Value Date   WBC 16.1 (H) 11/09/2013    HGB 16.3 11/09/2013   HCT 49.3 11/09/2013   MCV 84.1 11/09/2013   PLT 377.0 11/09/2013   Lab Results  Component Value Date   CREATININE 0.89 06/17/2016   BUN 12 06/17/2016   NA 138 06/17/2016   K 4.5 06/17/2016   CL 100 06/17/2016   CO2 27 06/17/2016   Lab Results  Component Value Date   ALT 19 10/20/2015   AST 17 10/20/2015   ALKPHOS 87 10/20/2015   BILITOT 0.7 10/20/2015   Lab Results  Component Value Date   CHOL 143 06/17/2016   Lab Results  Component Value Date   HDL 33.50 (L) 06/17/2016   Lab Results  Component Value Date   LDLCALC 92 06/17/2016   Lab Results  Component Value Date   TRIG 86.0 06/17/2016   Lab Results  Component Value Date   CHOLHDL 4 06/17/2016   Lab Results  Component Value Date   HGBA1C 10.5 (H) 06/17/2016    ASSESSMENT AND PLAN:   1) Health maintenance exam: Reviewed age and gender appropriate health maintenance issues (prudent diet, regular exercise, health risks of tobacco and excessive alcohol, use of seatbelts, fire alarms in home, use of sunscreen).  Also reviewed age and gender appropriate health screening as well as vaccine recommendations. Vaccines:  Flu vaccine--given today.   Shingrix --declines today. Labs: HP labs + HbA1c, vit B12 level, PSA. Prostate ca screening: DRE normal , PSA. Colon ca screening: needs screening colonoscopy.  He wants to put this off at this time.  2) DM 2; sounds like control improving (based on his report of home glucoses). HbA1c today. 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  O/w fine. Eye exam UTD. Flu vaccine today.  3) HTN: hard to tell control since we have no good data OUTSIDE of medical setting. Encouraged pt to check bp at least 3 days per week at home and record numbers (with HR) and we'll review these at next f/u visit. Lytes/cr today. An After Visit Summary was printed and given to the patient.  FOLLOW UP:  No Follow-up  on file.  Signed:  Crissie Sickles, MD           06/07/2017

## 2017-06-09 ENCOUNTER — Other Ambulatory Visit (INDEPENDENT_AMBULATORY_CARE_PROVIDER_SITE_OTHER): Payer: PPO

## 2017-06-09 DIAGNOSIS — R946 Abnormal results of thyroid function studies: Secondary | ICD-10-CM | POA: Diagnosis not present

## 2017-06-09 LAB — T4, FREE: Free T4: 0.97 ng/dL (ref 0.60–1.60)

## 2017-06-10 LAB — T3: T3, Total: 154 ng/dL (ref 76–181)

## 2017-06-14 ENCOUNTER — Encounter: Payer: Self-pay | Admitting: Family Medicine

## 2017-06-24 ENCOUNTER — Other Ambulatory Visit: Payer: Self-pay | Admitting: Family Medicine

## 2017-07-06 DIAGNOSIS — G894 Chronic pain syndrome: Secondary | ICD-10-CM | POA: Diagnosis not present

## 2017-07-06 DIAGNOSIS — Z79891 Long term (current) use of opiate analgesic: Secondary | ICD-10-CM | POA: Diagnosis not present

## 2017-07-06 DIAGNOSIS — M25551 Pain in right hip: Secondary | ICD-10-CM | POA: Diagnosis not present

## 2017-07-06 DIAGNOSIS — M961 Postlaminectomy syndrome, not elsewhere classified: Secondary | ICD-10-CM | POA: Diagnosis not present

## 2017-07-14 ENCOUNTER — Other Ambulatory Visit: Payer: Self-pay | Admitting: Family Medicine

## 2017-07-16 ENCOUNTER — Other Ambulatory Visit: Payer: Self-pay | Admitting: Family Medicine

## 2017-08-20 ENCOUNTER — Other Ambulatory Visit: Payer: Self-pay | Admitting: Family Medicine

## 2017-09-06 DIAGNOSIS — M25551 Pain in right hip: Secondary | ICD-10-CM | POA: Diagnosis not present

## 2017-09-06 DIAGNOSIS — M961 Postlaminectomy syndrome, not elsewhere classified: Secondary | ICD-10-CM | POA: Diagnosis not present

## 2017-09-06 DIAGNOSIS — Z79891 Long term (current) use of opiate analgesic: Secondary | ICD-10-CM | POA: Diagnosis not present

## 2017-09-06 DIAGNOSIS — G894 Chronic pain syndrome: Secondary | ICD-10-CM | POA: Diagnosis not present

## 2017-10-11 ENCOUNTER — Ambulatory Visit: Payer: Self-pay | Admitting: Family Medicine

## 2017-11-10 DIAGNOSIS — Z79891 Long term (current) use of opiate analgesic: Secondary | ICD-10-CM | POA: Diagnosis not present

## 2017-11-10 DIAGNOSIS — G894 Chronic pain syndrome: Secondary | ICD-10-CM | POA: Diagnosis not present

## 2017-11-10 DIAGNOSIS — M961 Postlaminectomy syndrome, not elsewhere classified: Secondary | ICD-10-CM | POA: Diagnosis not present

## 2017-11-10 DIAGNOSIS — M25551 Pain in right hip: Secondary | ICD-10-CM | POA: Diagnosis not present

## 2018-01-05 DIAGNOSIS — G894 Chronic pain syndrome: Secondary | ICD-10-CM | POA: Diagnosis not present

## 2018-01-05 DIAGNOSIS — M25551 Pain in right hip: Secondary | ICD-10-CM | POA: Diagnosis not present

## 2018-01-05 DIAGNOSIS — M961 Postlaminectomy syndrome, not elsewhere classified: Secondary | ICD-10-CM | POA: Diagnosis not present

## 2018-01-05 DIAGNOSIS — Z79891 Long term (current) use of opiate analgesic: Secondary | ICD-10-CM | POA: Diagnosis not present

## 2018-02-20 ENCOUNTER — Other Ambulatory Visit: Payer: Self-pay | Admitting: Family Medicine

## 2018-02-23 NOTE — Telephone Encounter (Signed)
Left message for pt to call back.   Okay for P[facility] to advise pt. 

## 2018-02-27 ENCOUNTER — Encounter: Payer: Self-pay | Admitting: *Deleted

## 2018-02-27 NOTE — Telephone Encounter (Signed)
Letter mailed to address in EMR.

## 2018-02-28 ENCOUNTER — Other Ambulatory Visit: Payer: Self-pay | Admitting: Family Medicine

## 2018-03-20 DIAGNOSIS — E1142 Type 2 diabetes mellitus with diabetic polyneuropathy: Secondary | ICD-10-CM | POA: Diagnosis not present

## 2018-03-20 DIAGNOSIS — G894 Chronic pain syndrome: Secondary | ICD-10-CM | POA: Diagnosis not present

## 2018-03-20 DIAGNOSIS — Z79891 Long term (current) use of opiate analgesic: Secondary | ICD-10-CM | POA: Diagnosis not present

## 2018-03-20 DIAGNOSIS — M961 Postlaminectomy syndrome, not elsewhere classified: Secondary | ICD-10-CM | POA: Diagnosis not present

## 2018-05-16 DIAGNOSIS — Z79891 Long term (current) use of opiate analgesic: Secondary | ICD-10-CM | POA: Diagnosis not present

## 2018-05-16 DIAGNOSIS — E1142 Type 2 diabetes mellitus with diabetic polyneuropathy: Secondary | ICD-10-CM | POA: Diagnosis not present

## 2018-05-16 DIAGNOSIS — M961 Postlaminectomy syndrome, not elsewhere classified: Secondary | ICD-10-CM | POA: Diagnosis not present

## 2018-05-16 DIAGNOSIS — G894 Chronic pain syndrome: Secondary | ICD-10-CM | POA: Diagnosis not present

## 2018-05-29 ENCOUNTER — Other Ambulatory Visit: Payer: Self-pay | Admitting: Family Medicine

## 2018-07-06 ENCOUNTER — Encounter: Payer: Self-pay | Admitting: Family Medicine

## 2018-07-06 ENCOUNTER — Ambulatory Visit (INDEPENDENT_AMBULATORY_CARE_PROVIDER_SITE_OTHER): Payer: PPO | Admitting: Family Medicine

## 2018-07-06 ENCOUNTER — Other Ambulatory Visit: Payer: Self-pay

## 2018-07-06 DIAGNOSIS — E118 Type 2 diabetes mellitus with unspecified complications: Secondary | ICD-10-CM

## 2018-07-06 DIAGNOSIS — E7800 Pure hypercholesterolemia, unspecified: Secondary | ICD-10-CM

## 2018-07-06 DIAGNOSIS — I10 Essential (primary) hypertension: Secondary | ICD-10-CM

## 2018-07-06 DIAGNOSIS — E039 Hypothyroidism, unspecified: Secondary | ICD-10-CM

## 2018-07-06 DIAGNOSIS — E038 Other specified hypothyroidism: Secondary | ICD-10-CM

## 2018-07-06 MED ORDER — FUROSEMIDE 20 MG PO TABS: ORAL_TABLET | ORAL | 0 refills | Status: DC

## 2018-07-06 MED ORDER — INSULIN NPH ISOPHANE & REGULAR (70-30) 100 UNIT/ML ~~LOC~~ SUSP: SUBCUTANEOUS | 5 refills | Status: DC

## 2018-07-06 MED ORDER — "PEN NEEDLES 5/16"" 31G X 8 MM MISC": 1 refills | Status: DC

## 2018-07-06 NOTE — Progress Notes (Signed)
Virtual Visit via Video Note  I connected with pt on 07/06/18 at 10:45 AM EDT by a video enabled telemedicine application and verified that I am speaking with the correct person using two identifiers.  Location patient: home Location provider:work office Persons participating in the virtual visit: patient, pt's wife Bethena Roys, and myself.  I discussed the limitations of evaluation and management by telemedicine and the availability of in person appointments. The patient expressed understanding and agreed to proceed.   HPI: I last saw him for follow up 1 yr ago. This is visit for f/u DM, HTN, HLD, and morbid obesity. Had mildly elevated TSH with normal T4 and T3 last visit.  Will repeat these today.  DM: A1c at last visit 1 yr ago was 12.0%. I recommended he Increase 70/30 insulin to 100 U qAM and 80 U q Supper. With wt loss he had to decrease insulin to 70 U qAM and 50 U qhs b/c he was having hypoglycemia. Glucoses lately in the 90s both fasting and in evenings. "Watching what I eat"--couldn't elaborate any further, but still drinking mother's apple cider vinegar bid.   HTN: has always been difficult to assess home bp control b/c pt cannot give a straight answer. Once again, he is unable to give much detail about this. 143/78 at last pain mgmt MD visit per pt report. Occ check at [facility] is about same.  HLD: not taking atorva 40mg  qd.  It was causing generalized myalgias and weakness.  When he stopped the med the sx's resolved.  Obesity: has joined a gym, has lost 40 lbs in the last year!   ROS: See pertinent positives and negatives per HPI. Some waxing/waning LL edema, has been using 20mg  lasix qd prn swelling and has averaged a couple doses a week, sometimes goes a week w/out it.  Past Medical History:  Diagnosis Date  . B12 deficiency   . Chronic low back pain    Dr. Hardin Negus is pain mgmt MD  . Diabetes mellitus with complication ([place])   . GERD (gastroesophageal reflux disease)    . Hypertension   . OSA (obstructive sleep apnea)    on nighttime home O2 (refuses to wear CPAP because of claustrophobia)  . 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 Persistent asthma    Never smoker  . Stable angina ([facility])    nl myoview 12/07  followed by Dr Johnsie Cancel  . Subclinical hypothyroidism 06/2017    Past Surgical History:  Procedure Laterality Date  . LUMBAR SPINE SURGERY     lumbar laminectomy with hardware stabilization, with ensuing arachnoiditis  . [place] and 2004.   Laser surgery.    Family History  Problem Relation Age of Onset  . Heart disease Other   . Hypertension Other   . Other Other        Emotional Illness  . Cancer Paternal Grandfather        prostate       Current Outpatient Medications:  .  albuterol (VENTOLIN HFA) 108 (90 BASE) MCG/ACT inhaler, Inhale 2 puffs into the lungs every 6 (six) hours as needed for wheezing or shortness of breath., Disp: 1 Inhaler, Rfl: 1 .  atorvastatin (LIPITOR) 40 MG tablet, TAKE 1 TABLET BY MOUTH EVERY DAY, Disp: 90 tablet, Rfl: 1 .  Cholecalciferol (DIALYVITE VITAMIN D 5000) 5000 UNITS capsule, Take 5,000 Units by mouth daily.  , Disp: , Rfl:  .  diazepam (VALIUM) 5  MG tablet, Take 5 mg by mouth every 6 (six) hours as needed. As needed for anxiety and muscle spasms, Disp: , Rfl:  .  furosemide (LASIX) 20 MG tablet, TAKE 1 TABLET BY MOUTH EVERY MORNING AS NEEDED FOR SWELLING, Disp: 90 tablet, Rfl: 0 .  glucose blood (FREESTYLE TEST STRIPS) test strip, CHECK GLUCOSE 4 TIMES DAILY, Disp: 100 each, Rfl: 12 .  insulin NPH-regular Human (NOVOLIN 70/30 RELION) (70-30) 100 UNIT/ML injection, Inject 80-90 units in the am and 70-80 units in the pm, Disp: 50 vial, Rfl: 5 .  Insulin Pen Needle (PEN NEEDLES 31GX5/16") 31G X 8 MM MISC, Use as directed once a day for medication injection, Disp: 100 each, Rfl: 1 .  lisinopril (PRINIVIL,ZESTRIL) 5 MG tablet, Take 1 tablet (5 mg  total) by mouth daily. OFFICE VISIT NEEDED, Disp: 90 tablet, Rfl: 0 .  metoprolol tartrate (LOPRESSOR) 50 MG tablet, TAKE 1 TABLET BY MOUTH TWICE A DAY, Disp: 60 tablet, Rfl: 2 .  morphine (MS CONTIN) 30 MG 12 hr tablet, Take 30 mg by mouth every 8 (eight) hours. , Disp: , Rfl:  .  Multiple Vitamin (MULTIVITAMIN) tablet, Take 1 tablet by mouth daily.  , Disp: , Rfl:  .  nitroGLYCERIN (NITROSTAT) 0.4 MG SL tablet, Place 1 tablet (0.4 mg total) under the tongue every 5 (five) minutes as needed., Disp: 30 tablet, Rfl: 1 .  ranitidine (ZANTAC) 150 MG tablet, Take 150 mg by mouth daily.  , Disp: , Rfl:  .  venlafaxine XR (EFFEXOR-XR) 150 MG 24 hr capsule, Take 1 capsule by mouth 2 (two) times daily., Disp: , Rfl: 3  EXAM:  VITALS per patient if applicable: There were no vitals taken for this visit.   GENERAL: alert, oriented, appears well and in no acute distress  HEENT: atraumatic, conjunttiva clear, no obvious abnormalities on inspection of external nose and ears  NECK: normal movements of the head and neck  LUNGS: on inspection no signs of respiratory distress, breathing rate appears normal, no obvious gross SOB, gasping or wheezing  CV: no obvious cyanosis  MS: moves all visible extremities without noticeable abnormality  PSYCH/NEURO: pleasant and cooperative, no obvious depression or anxiety, speech and thought processing grossly intact  LABS: none today.  Lab Results  Component Value Date   TSH 7.01 (H) 06/07/2017   Lab Results  Component Value Date   WBC 14.7 (H) 06/07/2017   HGB 15.6 06/07/2017   HCT 47.3 06/07/2017   MCV 84.6 06/07/2017   PLT 444.0 (H) 06/07/2017   Lab Results  Component Value Date   CREATININE 0.98 06/07/2017   BUN 15 06/07/2017   NA 140 06/07/2017   K 4.5 06/07/2017   CL 99 06/07/2017   CO2 30 06/07/2017   Lab Results  Component Value Date   ALT 17 06/07/2017   AST 13 06/07/2017   ALKPHOS 76 06/07/2017   BILITOT 0.5 06/07/2017   Lab  Results  Component Value Date   CHOL 140 06/07/2017   Lab Results  Component Value Date   HDL 43.40 06/07/2017   Lab Results  Component Value Date   LDLCALC 78 06/07/2017   Lab Results  Component Value Date   TRIG 96.0 06/07/2017   Lab Results  Component Value Date   CHOLHDL 3 06/07/2017   Lab Results  Component Value Date   PSA 0.27 06/07/2017   Lab Results  Component Value Date   HGBA1C 12.0 (H) 06/07/2017    ASSESSMENT AND PLAN:  Discussed the following assessment and plan:  1) DM 2: sounds like control much better since he has been losing wt purposefully! Continue 70/30 at 70 U qAM and 50 U qSupper. Continue at least bid glucose checks. He'll come in for lab: A1c, CMET, urine microalb/cr.  2) HLD: intol of atorva. We'll discuss trying a different statin in the future (at next f/u visit). No new med today. He'll get FLP with future labs.  3) Morb obesity: he is making great strides with exercise and diet. Congratulated him on this, encouraged him to continue exercise habits despite being confined to home due to covid 19 situation.  4) Subclinical hypothyroidism: recheck TSH, T4, and T3 with future labs.   I discussed the assessment and treatment plan with the patient. The patient was provided an opportunity to ask questions and all were answered. The patient agreed with the plan and demonstrated an understanding of the instructions.   The patient was advised to call back or seek an in-person evaluation if the symptoms worsen or if the condition fails to improve as anticipated.   F/u: 4 mo RCI  Signed:  Crissie Sickles, MD           07/06/2018

## 2018-07-11 DIAGNOSIS — F329 Major depressive disorder, single episode, unspecified: Secondary | ICD-10-CM | POA: Diagnosis not present

## 2018-07-11 DIAGNOSIS — E1142 Type 2 diabetes mellitus with diabetic polyneuropathy: Secondary | ICD-10-CM | POA: Diagnosis not present

## 2018-07-11 DIAGNOSIS — K219 Gastro-esophageal reflux disease without esophagitis: Secondary | ICD-10-CM | POA: Diagnosis not present

## 2018-07-11 DIAGNOSIS — F410 Panic disorder [episodic paroxysmal anxiety] without agoraphobia: Secondary | ICD-10-CM | POA: Diagnosis not present

## 2018-07-11 DIAGNOSIS — Z79891 Long term (current) use of opiate analgesic: Secondary | ICD-10-CM | POA: Diagnosis not present

## 2018-07-11 DIAGNOSIS — G894 Chronic pain syndrome: Secondary | ICD-10-CM | POA: Diagnosis not present

## 2018-07-11 DIAGNOSIS — M961 Postlaminectomy syndrome, not elsewhere classified: Secondary | ICD-10-CM | POA: Diagnosis not present

## 2018-07-12 ENCOUNTER — Other Ambulatory Visit: Payer: Self-pay

## 2018-07-12 ENCOUNTER — Other Ambulatory Visit (INDEPENDENT_AMBULATORY_CARE_PROVIDER_SITE_OTHER): Payer: PPO

## 2018-07-12 DIAGNOSIS — Z125 Encounter for screening for malignant neoplasm of prostate: Secondary | ICD-10-CM | POA: Diagnosis not present

## 2018-07-12 DIAGNOSIS — Z23 Encounter for immunization: Secondary | ICD-10-CM | POA: Diagnosis not present

## 2018-07-12 DIAGNOSIS — H401131 Primary open-angle glaucoma, bilateral, mild stage: Secondary | ICD-10-CM | POA: Diagnosis not present

## 2018-07-12 DIAGNOSIS — E039 Hypothyroidism, unspecified: Secondary | ICD-10-CM

## 2018-07-12 DIAGNOSIS — R509 Fever, unspecified: Secondary | ICD-10-CM | POA: Diagnosis not present

## 2018-07-12 DIAGNOSIS — E118 Type 2 diabetes mellitus with unspecified complications: Secondary | ICD-10-CM | POA: Diagnosis not present

## 2018-07-12 DIAGNOSIS — D46C Myelodysplastic syndrome with isolated del(5q) chromosomal abnormality: Secondary | ICD-10-CM | POA: Diagnosis not present

## 2018-07-12 DIAGNOSIS — E038 Other specified hypothyroidism: Secondary | ICD-10-CM

## 2018-07-12 DIAGNOSIS — R5383 Other fatigue: Secondary | ICD-10-CM | POA: Diagnosis not present

## 2018-07-12 DIAGNOSIS — N179 Acute kidney failure, unspecified: Secondary | ICD-10-CM | POA: Diagnosis not present

## 2018-07-12 DIAGNOSIS — I10 Essential (primary) hypertension: Secondary | ICD-10-CM | POA: Diagnosis not present

## 2018-07-12 DIAGNOSIS — E7800 Pure hypercholesterolemia, unspecified: Secondary | ICD-10-CM

## 2018-07-12 DIAGNOSIS — E1122 Type 2 diabetes mellitus with diabetic chronic kidney disease: Secondary | ICD-10-CM | POA: Diagnosis not present

## 2018-07-12 DIAGNOSIS — E785 Hyperlipidemia, unspecified: Secondary | ICD-10-CM | POA: Diagnosis not present

## 2018-07-12 DIAGNOSIS — R55 Syncope and collapse: Secondary | ICD-10-CM | POA: Diagnosis not present

## 2018-07-12 DIAGNOSIS — H524 Presbyopia: Secondary | ICD-10-CM | POA: Diagnosis not present

## 2018-07-12 DIAGNOSIS — N390 Urinary tract infection, site not specified: Secondary | ICD-10-CM | POA: Diagnosis not present

## 2018-07-12 DIAGNOSIS — I82409 Acute embolism and thrombosis of unspecified deep veins of unspecified lower extremity: Secondary | ICD-10-CM | POA: Diagnosis not present

## 2018-07-12 DIAGNOSIS — N183 Chronic kidney disease, stage 3 (moderate): Secondary | ICD-10-CM | POA: Diagnosis not present

## 2018-07-12 DIAGNOSIS — R001 Bradycardia, unspecified: Secondary | ICD-10-CM | POA: Diagnosis not present

## 2018-07-12 DIAGNOSIS — H35033 Hypertensive retinopathy, bilateral: Secondary | ICD-10-CM | POA: Diagnosis not present

## 2018-07-12 LAB — LIPID PANEL
Cholesterol: 184 mg/dL (ref 0–200)
HDL: 39.3 mg/dL (ref >39.00)
LDL Cholesterol: 112 mg/dL — ABNORMAL HIGH (ref 0–99)
NonHDL: 144.4
Total CHOL/HDL Ratio: 5
Triglycerides: 162 mg/dL — ABNORMAL HIGH (ref 0.0–149.0)
VLDL: 32.4 mg/dL (ref 0.0–40.0)

## 2018-07-12 LAB — COMPREHENSIVE METABOLIC PANEL
ALT: 13 U/L (ref 0–53)
AST: 12 U/L (ref 0–37)
Albumin: 3.6 g/dL (ref 3.5–5.2)
Alkaline Phosphatase: 63 U/L (ref 39–117)
BUN: 13 mg/dL (ref 6–23)
CO2: 30 mEq/L (ref 19–32)
Calcium: 9 mg/dL (ref 8.4–10.5)
Chloride: 100 mEq/L (ref 96–112)
Creatinine, Ser: 0.86 mg/dL (ref 0.40–1.50)
GFR: 93.31 mL/min (ref >60.00)
Glucose, Bld: 100 mg/dL — ABNORMAL HIGH (ref 70–99)
Potassium: 4.1 mEq/L (ref 3.5–5.1)
Sodium: 140 mEq/L (ref 135–145)
Total Bilirubin: 0.4 mg/dL (ref 0.2–1.2)
Total Protein: 6.3 g/dL (ref 6.0–8.3)

## 2018-07-12 LAB — MICROALBUMIN / CREATININE URINE RATIO
Creatinine,U: 156.9 mg/dL
Microalb Creat Ratio: 7.3 mg/g (ref 0.0–30.0)
Microalb, Ur: 11.5 mg/dL — ABNORMAL HIGH (ref 0.0–1.9)

## 2018-07-12 LAB — HEMOGLOBIN A1C: Hgb A1c MFr Bld: 13.5 % — ABNORMAL HIGH (ref 4.6–6.5)

## 2018-07-12 LAB — TSH: TSH: 3.11 u[IU]/mL (ref 0.35–4.50)

## 2018-07-12 LAB — T4, FREE: Free T4: 0.95 ng/dL (ref 0.60–1.60)

## 2018-07-13 LAB — T3: T3, Total: 140 ng/dL (ref 76–181)

## 2018-08-03 ENCOUNTER — Encounter: Payer: Self-pay | Admitting: Family Medicine

## 2018-08-14 ENCOUNTER — Ambulatory Visit: Payer: PPO | Admitting: Family Medicine

## 2018-09-26 DIAGNOSIS — G894 Chronic pain syndrome: Secondary | ICD-10-CM | POA: Diagnosis not present

## 2018-09-26 DIAGNOSIS — E1142 Type 2 diabetes mellitus with diabetic polyneuropathy: Secondary | ICD-10-CM | POA: Diagnosis not present

## 2018-09-26 DIAGNOSIS — M961 Postlaminectomy syndrome, not elsewhere classified: Secondary | ICD-10-CM | POA: Diagnosis not present

## 2018-09-26 DIAGNOSIS — Z79891 Long term (current) use of opiate analgesic: Secondary | ICD-10-CM | POA: Diagnosis not present

## 2018-09-29 ENCOUNTER — Other Ambulatory Visit: Payer: Self-pay | Admitting: Family Medicine

## 2018-11-09 ENCOUNTER — Ambulatory Visit: Payer: PPO | Admitting: Family Medicine

## 2018-11-20 ENCOUNTER — Telehealth: Payer: Self-pay

## 2018-11-20 NOTE — Telephone Encounter (Signed)
Received order for pt's testing supplies. PCP will review and sign, if appropriate.

## 2018-11-20 NOTE — Telephone Encounter (Signed)
Signed and given to you! 

## 2018-11-20 NOTE — Telephone Encounter (Signed)
Placed in bin to go up front

## 2018-12-14 DIAGNOSIS — M961 Postlaminectomy syndrome, not elsewhere classified: Secondary | ICD-10-CM | POA: Diagnosis not present

## 2018-12-14 DIAGNOSIS — Z79891 Long term (current) use of opiate analgesic: Secondary | ICD-10-CM | POA: Diagnosis not present

## 2018-12-14 DIAGNOSIS — G894 Chronic pain syndrome: Secondary | ICD-10-CM | POA: Diagnosis not present

## 2018-12-14 DIAGNOSIS — E1142 Type 2 diabetes mellitus with diabetic polyneuropathy: Secondary | ICD-10-CM | POA: Diagnosis not present

## 2019-02-08 DIAGNOSIS — E1142 Type 2 diabetes mellitus with diabetic polyneuropathy: Secondary | ICD-10-CM | POA: Diagnosis not present

## 2019-02-08 DIAGNOSIS — G894 Chronic pain syndrome: Secondary | ICD-10-CM | POA: Diagnosis not present

## 2019-02-08 DIAGNOSIS — Z79891 Long term (current) use of opiate analgesic: Secondary | ICD-10-CM | POA: Diagnosis not present

## 2019-02-08 DIAGNOSIS — M961 Postlaminectomy syndrome, not elsewhere classified: Secondary | ICD-10-CM | POA: Diagnosis not present

## 2019-04-24 ENCOUNTER — Other Ambulatory Visit: Payer: Self-pay | Admitting: Family Medicine

## 2019-04-25 ENCOUNTER — Other Ambulatory Visit: Payer: Self-pay

## 2019-04-25 MED ORDER — FUROSEMIDE 20 MG PO TABS: ORAL_TABLET | ORAL | 0 refills | Status: DC

## 2019-05-10 DIAGNOSIS — E1142 Type 2 diabetes mellitus with diabetic polyneuropathy: Secondary | ICD-10-CM | POA: Diagnosis not present

## 2019-05-10 DIAGNOSIS — M961 Postlaminectomy syndrome, not elsewhere classified: Secondary | ICD-10-CM | POA: Diagnosis not present

## 2019-05-10 DIAGNOSIS — Z79891 Long term (current) use of opiate analgesic: Secondary | ICD-10-CM | POA: Diagnosis not present

## 2019-05-10 DIAGNOSIS — G894 Chronic pain syndrome: Secondary | ICD-10-CM | POA: Diagnosis not present

## 2019-09-04 DIAGNOSIS — E1142 Type 2 diabetes mellitus with diabetic polyneuropathy: Secondary | ICD-10-CM | POA: Diagnosis not present

## 2019-09-04 DIAGNOSIS — G894 Chronic pain syndrome: Secondary | ICD-10-CM | POA: Diagnosis not present

## 2019-09-04 DIAGNOSIS — M961 Postlaminectomy syndrome, not elsewhere classified: Secondary | ICD-10-CM | POA: Diagnosis not present

## 2019-09-04 DIAGNOSIS — Z79891 Long term (current) use of opiate analgesic: Secondary | ICD-10-CM | POA: Diagnosis not present

## 2019-09-05 ENCOUNTER — Other Ambulatory Visit: Payer: Self-pay | Admitting: Family Medicine

## 2019-09-06 DIAGNOSIS — Z794 Long term (current) use of insulin: Secondary | ICD-10-CM | POA: Diagnosis not present

## 2019-09-06 DIAGNOSIS — I10 Essential (primary) hypertension: Secondary | ICD-10-CM | POA: Diagnosis not present

## 2019-09-06 DIAGNOSIS — G8929 Other chronic pain: Secondary | ICD-10-CM | POA: Diagnosis not present

## 2019-09-06 DIAGNOSIS — R69 Illness, unspecified: Secondary | ICD-10-CM | POA: Diagnosis not present

## 2019-09-06 DIAGNOSIS — E1142 Type 2 diabetes mellitus with diabetic polyneuropathy: Secondary | ICD-10-CM | POA: Diagnosis not present

## 2019-09-06 DIAGNOSIS — E1151 Type 2 diabetes mellitus with diabetic peripheral angiopathy without gangrene: Secondary | ICD-10-CM | POA: Diagnosis not present

## 2019-09-06 DIAGNOSIS — E669 Obesity, unspecified: Secondary | ICD-10-CM | POA: Diagnosis not present

## 2019-09-06 DIAGNOSIS — K219 Gastro-esophageal reflux disease without esophagitis: Secondary | ICD-10-CM | POA: Diagnosis not present

## 2019-09-06 DIAGNOSIS — F419 Anxiety disorder, unspecified: Secondary | ICD-10-CM | POA: Diagnosis not present

## 2019-09-06 DIAGNOSIS — E1143 Type 2 diabetes mellitus with diabetic autonomic (poly)neuropathy: Secondary | ICD-10-CM | POA: Diagnosis not present

## 2019-09-08 ENCOUNTER — Other Ambulatory Visit: Payer: Self-pay

## 2019-09-08 MED ORDER — LISINOPRIL 5 MG PO TABS: 5.0000 mg | ORAL_TABLET | Freq: Every day | ORAL | 0 refills | Status: DC

## 2019-09-29 ENCOUNTER — Other Ambulatory Visit: Payer: Self-pay | Admitting: Family Medicine

## 2019-10-01 NOTE — Telephone Encounter (Signed)
Patient not seen since 07/2018.  Patient contacted and appointment scheduled for 10/18/19.  # 30 supply sent to pharmacy.

## 2019-10-03 ENCOUNTER — Other Ambulatory Visit: Payer: Self-pay | Admitting: Family Medicine

## 2019-10-08 ENCOUNTER — Other Ambulatory Visit: Payer: Self-pay | Admitting: Family Medicine

## 2019-10-11 ENCOUNTER — Other Ambulatory Visit: Payer: Self-pay | Admitting: Family Medicine

## 2019-10-16 ENCOUNTER — Other Ambulatory Visit: Payer: Self-pay

## 2019-10-18 ENCOUNTER — Ambulatory Visit (INDEPENDENT_AMBULATORY_CARE_PROVIDER_SITE_OTHER): Payer: Medicare HMO | Admitting: Family Medicine

## 2019-10-18 ENCOUNTER — Encounter: Payer: Self-pay | Admitting: Family Medicine

## 2019-10-18 ENCOUNTER — Other Ambulatory Visit: Payer: Self-pay

## 2019-10-18 VITALS — BP 152/94 | HR 106 | Temp 98.6°F | Resp 20 | Wt 268.4 lb

## 2019-10-18 DIAGNOSIS — E1149 Type 2 diabetes mellitus with other diabetic neurological complication: Secondary | ICD-10-CM | POA: Diagnosis not present

## 2019-10-18 DIAGNOSIS — I10 Essential (primary) hypertension: Secondary | ICD-10-CM

## 2019-10-18 DIAGNOSIS — R7989 Other specified abnormal findings of blood chemistry: Secondary | ICD-10-CM

## 2019-10-18 DIAGNOSIS — E7800 Pure hypercholesterolemia, unspecified: Secondary | ICD-10-CM

## 2019-10-18 DIAGNOSIS — E538 Deficiency of other specified B group vitamins: Secondary | ICD-10-CM

## 2019-10-18 DIAGNOSIS — Z125 Encounter for screening for malignant neoplasm of prostate: Secondary | ICD-10-CM | POA: Diagnosis not present

## 2019-10-18 MED ORDER — ESOMEPRAZOLE MAGNESIUM 40 MG PO CPDR: 40.0000 mg | DELAYED_RELEASE_CAPSULE | Freq: Every day | ORAL | 3 refills | Status: DC

## 2019-10-18 MED ORDER — METOPROLOL TARTRATE 50 MG PO TABS: 50.0000 mg | ORAL_TABLET | Freq: Two times a day (BID) | ORAL | 4 refills | Status: DC

## 2019-10-18 NOTE — Progress Notes (Signed)
OFFICE VISIT  10/21/2019   CC:  Chief Complaint  Patient presents with  . FU DM, refills     HPI:    Patient is a 54 y.o. Caucasian male who presents for f/u DM, HTN, HLD, and morbid obesity. I last saw him 15 months ago. A/P as of that visit: "1) DM 2: sounds like control much better since he has been losing wt purposefully! Continue 70/30 at 70 U qAM and 50 U qSupper. Continue at least bid glucose checks. He'll come in for lab: A1c, CMET, urine microalb/cr.  2) HLD: intol of atorva. We'll discuss trying a different statin in the future (at next f/u visit). No new med today. He'll get FLP with future labs.  3) Morb obesity: he is making great strides with exercise and diet. Congratulated him on this, encouraged him to continue exercise habits despite being confined to home due to covid 19 situation.  4) Subclinical hypothyroidism: recheck TSH, T4, and T3 with future labs."  INTERIM HX: A1c was up over 13 last visit.  Since his glucose monitoring info was ? Unreliable and not correlating with the A1c I wanted him to have his wife help him check with with his CGM system and write glucoses down and have o/v about 2 wks later and review but no appt ever occurred.  I had not changed insulin dosing. His lipids were not at goal and he had not tolerated atorva, but I chose to hold off on starting new statin in the setting of poorly controlled DM/uncertain insulin dosing adjustments/etc. Thyroid labs were normal.  Currently: Feeling fine. Walks 30-60 min per day but has to stop and rest after 20 min b/c of diffuse legs weakness.  Can feel it in his thighs but has long hx of NO SENSATION at all in LL's from knees down.  Diet much better low carb. DM: checks gluc fasting and 4-5 other times per day, says it stays around 150-160. Taking 50 qhs (about 1-2 hours after supper, not at supper), none in daytime usually.  Occ low in daytime but none at night. Feet: long term NO SENSATION in  bilat LL's.  No pain.  HTN: morbid obesity.  No home bp monitoring.  Has been out of metoprolol for about 4-5 months.  Vit b12 def: does not take any supplement.  His pain mgmt MD changed his diazepam to methocarbomol recently.  ROS: no fevers, no CP, no SOB, no wheezing, no cough, no dizziness, no HAs, no rashes, no melena/hematochezia.  No polyuria or polydipsia.  No myalgias. No focal weakness, paresthesias, or tremors.  No acute vision or hearing abnormalities. No n/v/d or abd pain.  No palpitations.    Past Medical History:  Diagnosis Date  . B12 deficiency   . Chronic low back pain    Dr. Hardin Negus is pain mgmt MD  . Diabetes mellitus with complication (HCC)    DPN + microalbuminuria  . GERD (gastroesophageal reflux disease)   . Hypercholesterolemia    Only mild elevation but statin indicated due to comorbid DM.  Atorva -->intol (myalgias).  Will start new statin after getting glucoses better controlled (2020, 10/2019))  . Hypertension   . OSA (obstructive sleep apnea)    on nighttime home O2 (refuses to wear CPAP because of claustrophobia)  . 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 Persistent asthma    Never smoker  . Stable angina (HCC)    nl myoview 12/07  followed  by Dr Johnsie Cancel  . Subclinical hypothyroidism 06/2017    Past Surgical History:  Procedure Laterality Date  . LUMBAR SPINE SURGERY     lumbar laminectomy with hardware stabilization, with ensuing arachnoiditis  . [place] and 2004.   Laser surgery.    Outpatient Medications Prior to Visit  Medication Sig Dispense Refill  . furosemide (LASIX) 20 MG tablet TAKE 1 TABLET BY MOUTH EVERY MORNING AS NEEDED FOR SWELLING. PLEASE MAKE APPT 30 tablet 0  . glucose blood (FREESTYLE TEST STRIPS) test strip CHECK GLUCOSE 4 TIMES DAILY 100 strip 12  . Insulin Pen Needle (PEN NEEDLES 31GX5/16") 31G X 8 MM MISC Use as directed once a day for medication injection 100  each 1  . lisinopril (ZESTRIL) 5 MG tablet Take 1 tablet (5 mg total) by mouth daily. 30 tablet 0  . methocarbamol (ROBAXIN) 500 MG tablet Take 500 mg by mouth 3 (three) times daily as needed.    Marland Kitchen morphine (MS CONTIN) 30 MG 12 hr tablet Take by mouth.    . nitroGLYCERIN (NITROSTAT) 0.4 MG SL tablet Place 1 tablet (0.4 mg total) under the tongue every 5 (five) minutes as needed. 30 tablet 1  . venlafaxine XR (EFFEXOR-XR) 150 MG 24 hr capsule Take 1 capsule by mouth 2 (two) times daily.  3  . ranitidine (ZANTAC) 150 MG tablet Take 150 mg by mouth daily.      . insulin NPH-regular Human (NOVOLIN 70/30) (70-30) 100 UNIT/ML injection INJECT 70 UNITS INTO THE SKIN EVERY MORNING AND 50 UNITS AT SUPPER. **NEED OFFICE VISIT NEEDED** 50 mL 0  . Multiple Vitamin (MULTIVITAMIN) tablet Take 1 tablet by mouth daily.   (Patient not taking: Reported on 10/18/2019)    . albuterol (VENTOLIN HFA) 108 (90 BASE) MCG/ACT inhaler Inhale 2 puffs into the lungs every 6 (six) hours as needed for wheezing or shortness of breath. (Patient not taking: Reported on 07/06/2018) 1 Inhaler 1  . Cholecalciferol (DIALYVITE VITAMIN D 5000) 5000 UNITS capsule Take 5,000 Units by mouth daily.      . diazepam (VALIUM) 5 MG tablet Take 5 mg by mouth every 6 (six) hours as needed. As needed for anxiety and muscle spasms (Patient not taking: Reported on 10/18/2019)    . esomeprazole (NEXIUM) 40 MG capsule     . metoprolol tartrate (LOPRESSOR) 50 MG tablet TAKE 1 TABLET BY MOUTH TWICE A DAY (Patient not taking: Reported on 10/18/2019) 60 tablet 2   No facility-administered medications prior to visit.    Allergies  Allergen Reactions  . Ace Inhibitors     Kidney failure  . Atorvastatin Other (See Comments)    Arm pain  . Prednisone     REACTION: Anxiety  . Septra [Sulfamethoxazole-Trimethoprim]   . Metformin And Related Diarrhea    ROS As per HPI  PE: Vitals with BMI 10/18/2019 06/07/2017 03/09/2017  Height - 5' 11.5" -  Weight 268  lbs 6 oz 324 lbs 12 oz -  BMI - 03.00 -  Systolic 923 300 762  Diastolic 94 83 68  Pulse 263 81 85  O2 sat on RA today is 98%  Gen: Alert, well appearing.  Patient is oriented to person, place, time, and situation. AFFECT: pleasant, lucid thought and speech. CV: RRR, no m/r/g.   LUNGS: CTA bilat, nonlabored resps, good aeration in all lung fields. EXT: no clubbing or cyanosis.  no edema.  Foot exam - no swelling, tenderness or skin or vascular lesions. Color  and temperature is normal. No sensation at all from toes up to knees bilat (chronic). Cannot palpate DP or PT pulses. Toenails are normal except thickened/mycotic big toes.    LABS:  Lab Results  Component Value Date   TSH 1.68 10/18/2019   T3TOTAL 127 10/18/2019    Lab Results  Component Value Date   WBC 14.7 (H) 06/07/2017   HGB 15.6 06/07/2017   HCT 47.3 06/07/2017   MCV 84.6 06/07/2017   PLT 444.0 (H) 06/07/2017   Lab Results  Component Value Date   CREATININE 0.81 10/18/2019   BUN 17 10/18/2019   NA 139 10/18/2019   K 4.5 10/18/2019   CL 99 10/18/2019   CO2 29 10/18/2019   Lab Results  Component Value Date   ALT 13 10/18/2019   AST 13 10/18/2019   ALKPHOS 63 07/12/2018   BILITOT 0.6 10/18/2019   Lab Results  Component Value Date   CHOL 163 10/18/2019   Lab Results  Component Value Date   HDL 51 10/18/2019   Lab Results  Component Value Date   LDLCALC 95 10/18/2019   Lab Results  Component Value Date   TRIG 83 10/18/2019   Lab Results  Component Value Date   CHOLHDL 3.2 10/18/2019   Lab Results  Component Value Date   PSA 0.2 10/18/2019   PSA 0.27 06/07/2017   Lab Results  Component Value Date   HGBA1C 10.0 (H) 10/18/2019   Lab Results  Component Value Date   VITAMINB12 444 10/18/2019    IMPRESSION AND PLAN:  1) DM 2: poor control.  Noncompliant with f/u. Need to get him back on some morning 70/30. SOunds like home monitoring showing fair glucoses but we'll see how his a1c  correlates and make any insulin changes based on this. He has DPN but feet w/out ulcers or worrisome lesion. He does have nonpalpable pulses in both LL's and gives hx vaguely suggestive of claudication (fatigue above knees>below knees). Will likely pursue ABIs near future but will try to get plans re-established for his DM and HTN first. Hba1c, lipids, urine microalb/cr, lytes/cr today.  2) HTN: get back on lopressor 50 mg bid. Continue lisin 5mg  qd. Lytes/cr today.  3) HLD: atorva intol.   This prob on hold until we can get him improved from DM standpoint. Simplicity a big need for this case.  4) Subclin hypoth: thyroid panel today.  5) Hx of vit B12 def: not on any supplement. Recheck b12 level today.  6) Chronic pain syndrome: LBP/DDD, hx of discectomy and fixation. Pain mgmt as per pain specialist.  7) Prostate ca screening: PSA today.  An After Visit Summary was printed and given to the patient.  FOLLOW UP: Return in about 4 months (around 02/18/2020) for routine chronic illness f/u.  Signed:  Crissie Sickles, MD           10/21/2019

## 2019-10-19 LAB — LIPID PANEL
Cholesterol: 163 mg/dL (ref <200)
HDL: 51 mg/dL (ref >=40)
LDL Cholesterol (Calc): 95 mg/dL (calc)
Non-HDL Cholesterol (Calc): 112 mg/dL (calc) (ref <130)
Total CHOL/HDL Ratio: 3.2 (calc) (ref <5.0)
Triglycerides: 83 mg/dL (ref <150)

## 2019-10-19 LAB — MICROALBUMIN / CREATININE URINE RATIO
Creatinine, Urine: 203 mg/dL (ref 20–320)
Microalb Creat Ratio: 100 mcg/mg creat — ABNORMAL HIGH (ref <30)
Microalb, Ur: 20.3 mg/dL

## 2019-10-19 LAB — TSH: TSH: 1.68 mIU/L (ref 0.40–4.50)

## 2019-10-19 LAB — HEMOGLOBIN A1C
Hgb A1c MFr Bld: 10 % of total Hgb — ABNORMAL HIGH (ref <5.7)
Mean Plasma Glucose: 240 (calc)
eAG (mmol/L): 13.3 (calc)

## 2019-10-19 LAB — COMPREHENSIVE METABOLIC PANEL
AG Ratio: 1.2 (calc) (ref 1.0–2.5)
ALT: 13 U/L (ref 9–46)
AST: 13 U/L (ref 10–35)
Albumin: 4.2 g/dL (ref 3.6–5.1)
Alkaline phosphatase (APISO): 71 U/L (ref 35–144)
BUN: 17 mg/dL (ref 7–25)
CO2: 29 mmol/L (ref 20–32)
Calcium: 9.4 mg/dL (ref 8.6–10.3)
Chloride: 99 mmol/L (ref 98–110)
Creat: 0.81 mg/dL (ref 0.70–1.33)
Globulin: 3.4 g/dL (calc) (ref 1.9–3.7)
Glucose, Bld: 186 mg/dL — ABNORMAL HIGH (ref 65–99)
Potassium: 4.5 mmol/L (ref 3.5–5.3)
Sodium: 139 mmol/L (ref 135–146)
Total Bilirubin: 0.6 mg/dL (ref 0.2–1.2)
Total Protein: 7.6 g/dL (ref 6.1–8.1)

## 2019-10-19 LAB — T4, FREE: Free T4: 1.4 ng/dL (ref 0.8–1.8)

## 2019-10-19 LAB — VITAMIN B12: Vitamin B-12: 444 pg/mL (ref 200–1100)

## 2019-10-19 LAB — PSA: PSA: 0.2 ng/mL (ref <=4.0)

## 2019-10-19 LAB — T3: T3, Total: 127 ng/dL (ref 76–181)

## 2019-10-21 ENCOUNTER — Encounter: Payer: Self-pay | Admitting: Family Medicine

## 2019-10-22 ENCOUNTER — Other Ambulatory Visit: Payer: Self-pay

## 2019-10-22 DIAGNOSIS — M961 Postlaminectomy syndrome, not elsewhere classified: Secondary | ICD-10-CM | POA: Diagnosis not present

## 2019-10-22 DIAGNOSIS — G894 Chronic pain syndrome: Secondary | ICD-10-CM | POA: Diagnosis not present

## 2019-10-22 DIAGNOSIS — Z79891 Long term (current) use of opiate analgesic: Secondary | ICD-10-CM | POA: Diagnosis not present

## 2019-10-22 DIAGNOSIS — E1142 Type 2 diabetes mellitus with diabetic polyneuropathy: Secondary | ICD-10-CM | POA: Diagnosis not present

## 2019-10-22 DIAGNOSIS — E1149 Type 2 diabetes mellitus with other diabetic neurological complication: Secondary | ICD-10-CM

## 2019-10-22 MED ORDER — BLOOD GLUCOSE MONITOR KIT: PACK | 0 refills | Status: AC

## 2019-10-22 MED ORDER — LISINOPRIL 10 MG PO TABS: 10.0000 mg | ORAL_TABLET | Freq: Every day | ORAL | 6 refills | Status: DC

## 2019-10-30 ENCOUNTER — Other Ambulatory Visit: Payer: Self-pay | Admitting: Family Medicine

## 2019-11-03 ENCOUNTER — Other Ambulatory Visit: Payer: Self-pay | Admitting: Family Medicine

## 2019-11-22 ENCOUNTER — Other Ambulatory Visit: Payer: Self-pay | Admitting: Family Medicine

## 2019-12-14 ENCOUNTER — Other Ambulatory Visit: Payer: Self-pay | Admitting: Family Medicine

## 2019-12-17 DIAGNOSIS — M961 Postlaminectomy syndrome, not elsewhere classified: Secondary | ICD-10-CM | POA: Diagnosis not present

## 2019-12-17 DIAGNOSIS — Z79891 Long term (current) use of opiate analgesic: Secondary | ICD-10-CM | POA: Diagnosis not present

## 2019-12-17 DIAGNOSIS — G894 Chronic pain syndrome: Secondary | ICD-10-CM | POA: Diagnosis not present

## 2019-12-17 DIAGNOSIS — E1142 Type 2 diabetes mellitus with diabetic polyneuropathy: Secondary | ICD-10-CM | POA: Diagnosis not present

## 2020-01-14 ENCOUNTER — Other Ambulatory Visit: Payer: Self-pay | Admitting: Family Medicine

## 2020-02-11 DIAGNOSIS — G894 Chronic pain syndrome: Secondary | ICD-10-CM | POA: Diagnosis not present

## 2020-02-11 DIAGNOSIS — M961 Postlaminectomy syndrome, not elsewhere classified: Secondary | ICD-10-CM | POA: Diagnosis not present

## 2020-02-11 DIAGNOSIS — E1142 Type 2 diabetes mellitus with diabetic polyneuropathy: Secondary | ICD-10-CM | POA: Diagnosis not present

## 2020-02-11 DIAGNOSIS — Z79891 Long term (current) use of opiate analgesic: Secondary | ICD-10-CM | POA: Diagnosis not present

## 2020-02-14 ENCOUNTER — Encounter (HOSPITAL_BASED_OUTPATIENT_CLINIC_OR_DEPARTMENT_OTHER): Payer: Self-pay | Admitting: Emergency Medicine

## 2020-02-14 ENCOUNTER — Telehealth: Payer: Self-pay

## 2020-02-14 ENCOUNTER — Other Ambulatory Visit: Payer: Self-pay

## 2020-02-14 ENCOUNTER — Emergency Department (HOSPITAL_BASED_OUTPATIENT_CLINIC_OR_DEPARTMENT_OTHER): Payer: Medicare Other

## 2020-02-14 ENCOUNTER — Emergency Department (HOSPITAL_BASED_OUTPATIENT_CLINIC_OR_DEPARTMENT_OTHER)
Admission: EM | Admit: 2020-02-14 | Discharge: 2020-02-14 | Disposition: A | Discharge status: Home / Self Care | Payer: Medicare Other | Attending: Emergency Medicine | Admitting: Emergency Medicine

## 2020-02-14 DIAGNOSIS — M545 Low back pain, unspecified: Secondary | ICD-10-CM | POA: Insufficient documentation

## 2020-02-14 DIAGNOSIS — W19XXXA Unspecified fall, initial encounter: Secondary | ICD-10-CM | POA: Insufficient documentation

## 2020-02-14 DIAGNOSIS — E039 Hypothyroidism, unspecified: Secondary | ICD-10-CM | POA: Insufficient documentation

## 2020-02-14 DIAGNOSIS — I10 Essential (primary) hypertension: Secondary | ICD-10-CM | POA: Diagnosis not present

## 2020-02-14 DIAGNOSIS — G8929 Other chronic pain: Secondary | ICD-10-CM | POA: Diagnosis not present

## 2020-02-14 DIAGNOSIS — Z794 Long term (current) use of insulin: Secondary | ICD-10-CM | POA: Diagnosis not present

## 2020-02-14 DIAGNOSIS — E1140 Type 2 diabetes mellitus with diabetic neuropathy, unspecified: Secondary | ICD-10-CM | POA: Diagnosis not present

## 2020-02-14 DIAGNOSIS — J449 Chronic obstructive pulmonary disease, unspecified: Secondary | ICD-10-CM | POA: Diagnosis not present

## 2020-02-14 DIAGNOSIS — R0781 Pleurodynia: Secondary | ICD-10-CM | POA: Insufficient documentation

## 2020-02-14 DIAGNOSIS — Z79899 Other long term (current) drug therapy: Secondary | ICD-10-CM | POA: Diagnosis not present

## 2020-02-14 NOTE — Telephone Encounter (Signed)
FYI only.  No call back needed.   Bethena Roys, wife (DPR) called regarding her husband.  She stated that he has fell and patient thinks he has fractured some ribs. She asked if Dr. Anitra Lauth could see him today. I advised her to go to ED.  She agreed.  She said that she is taking him to [facility] and was going now.

## 2020-02-14 NOTE — Telephone Encounter (Signed)
Agreed with plan

## 2020-02-14 NOTE — Discharge Instructions (Addendum)
You have been seen after a fall.  Imaging and exam look reassuring.  I have provided you with a incentive spirometer please use 3 times daily for the next 3 weeks. It is important that you use your incentive spirometer as this will help decrease chances of developing pneumonia. I recommend continue with your pain medications as prescribed.  May also include ibuprofen and or Tylenol every 6 as needed.    Please follow-up with your PCP in 3 weeks time for reevaluation.  Come back to the emergency department if you develop chest pain, shortness of breath, severe abdominal pain, uncontrolled nausea, vomiting, diarrhea.

## 2020-02-14 NOTE — ED Triage Notes (Signed)
States his legs gave out last night causing him to fall last night. C/o low back pain and R rib pain. Hx of chronic back pain.

## 2020-02-14 NOTE — Telephone Encounter (Signed)
Noted. Agree with disposition/triage to ED. Signed:  Crissie Sickles, MD           02/14/2020

## 2020-02-14 NOTE — ED Provider Notes (Addendum)
[facility] EMERGENCY DEPARTMENT Provider Note   CSN: [id number] Arrival date & time: 02/14/20  1625     History Chief Complaint  Patient presents with  . Dylan Owen is a 54 y.o. male.  HPI   Patient with significant medical history of chronic lower back pain, diabetes, hypertension, sleep apnea, peripheral neuropathy, presents to the emergency department after having a mechanical fall yesterday.  Patient states he was walking to his living room when he felt like his knees gave out causing him to fall onto the ground.  He states he hit the right side of his chest  On a table and then fell onto the floor.  He endorses that his legs generally give out on him frequently as he has severe chronic back pain and this is normal for him.  He states he has had increased lower back pain and right sided rib pain.  He endorses right rib pain with inspiration, as well as moving.  He states he has some lower back pain but states this is chronic for him.  He denies hitting his head, losing conscious, is not on anticoagulant.  He states he came in today because the pain continued and he thinks he may have a fractured rib.  He has been taking morphine for pain which he uses chronically for his lower back pain as prescribed by his pain management doctor.  He denies any alleviating factors at this time.  Patient denies headaches, fevers, chills, shortness of breath, chest pain, abdominal pain, nausea, vomiting, diarrhea, pedal edema.  Past Medical History:  Diagnosis Date  . B12 deficiency   . Chronic low back pain    Dr. Hardin Negus is pain mgmt MD  . Diabetes mellitus with complication (HCC)    DPN + microalbuminuria  . GERD (gastroesophageal reflux disease)   . Hypercholesterolemia    Only mild elevation but statin indicated due to comorbid DM.  Atorva -->intol (myalgias).  Will start new statin after getting glucoses better controlled (2020, 10/2019))  . Hypertension   . OSA  (obstructive sleep apnea)    on nighttime home O2 (refuses to wear CPAP because of claustrophobia)  . 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 Persistent asthma    Never smoker  . Stable angina (HCC)    nl myoview 12/07  followed by Dr Johnsie Cancel  . Subclinical hypothyroidism 06/2017    Patient Active Problem List   Diagnosis Date Noted  . Diabetes mellitus with complication ([place]) 47/42/5956  . Type II or unspecified type diabetes mellitus with peripheral circulatory disorders, uncontrolled(250.72) 11/23/2013  . Hyperlipidemia 11/23/2013  . Diminished pulses in lower extremity 11/23/2013  . Obesity 08/12/2011  . ALLERGIC RHINITIS 01/28/2010  . DEPRESSION 06/26/2009  . UNSPECIFIED VITAMIN D DEFICIENCY 07/04/2007  . INTERTRIGO, CANDIDAL 01/03/2007  . PERIPHERAL EDEMA 10/27/2006  . SLEEP APNEA, OBSTRUCTIVE 09/14/2006  . DIABETES MELLITUS, TYPE II 06/14/2006  . TOBACCO ABUSE 06/14/2006  . PERIPHERAL NEUROPATHY 06/14/2006  . Essential hypertension 06/14/2006  . ANGINA PECTORIS 06/14/2006  . COPD 06/14/2006  . GERD 06/14/2006  . LOW BACK PAIN 06/14/2006  . ANEMIA, VITAMIN B12 DEFICIENCY NEC 05/17/2006    Past Surgical History:  Procedure Laterality Date  . LUMBAR SPINE SURGERY     lumbar laminectomy with hardware stabilization, with ensuing arachnoiditis  . [place] and 2004.   Laser surgery.       Family History  Problem  Relation Age of Onset  . Heart disease Other   . Hypertension Other   . Other Other        Emotional Illness  . Cancer Paternal Grandfather        prostate    Social History   Tobacco Use  . Smoking status: Never Smoker  . Smokeless tobacco: Current User  . Tobacco comment: dip  Substance Use Topics  . Alcohol use: No  . Drug use: No    Home Medications Prior to Admission medications   Medication Sig Start Date End Date Taking? Authorizing Provider  blood glucose meter kit and  supplies KIT Dispense based on patient and insurance preference. Use to check glucose 4 times daily. 10/22/19   McGowen, Adrian Blackwater, MD  esomeprazole (NEXIUM) 40 MG capsule Take 1 capsule (40 mg total) by mouth daily. 10/18/19   McGowen, Adrian Blackwater, MD  furosemide (LASIX) 20 MG tablet TAKE 1 TABLET BY MOUTH EVERY MORNING AS NEEDED FOR SWELLING. PLEASE MAKE APPT 11/22/19   McGowen, Adrian Blackwater, MD  insulin NPH-regular Human (NOVOLIN 70/30) (70-30) 100 UNIT/ML injection INJECT 70 UNITS INTO THE SKIN EVERY MORNING AND 50 UNITS AT SUPPER. **NEED OFFICE VISIT NEEDED** 10/11/19   McGowen, Adrian Blackwater, MD  Insulin Pen Needle (PEN NEEDLES 31GX5/16") 31G X 8 MM MISC Use as directed once a day for medication injection 07/06/18   McGowen, Adrian Blackwater, MD  lisinopril (ZESTRIL) 10 MG tablet Take 1 tablet (10 mg total) by mouth daily. 10/22/19   McGowen, Adrian Blackwater, MD  methocarbamol (ROBAXIN) 500 MG tablet Take 500 mg by mouth 3 (three) times daily as needed. 10/05/19   [provider]  metoprolol tartrate (LOPRESSOR) 50 MG tablet Take 1 tablet (50 mg total) by mouth 2 (two) times daily. 10/18/19   McGowen, Adrian Blackwater, MD  morphine (MS CONTIN) 30 MG 12 hr tablet Take by mouth. 10/05/19   [provider]  Multiple Vitamin (MULTIVITAMIN) tablet Take 1 tablet by mouth daily.   Patient not taking: Reported on 10/18/2019    [provider]  nitroGLYCERIN (NITROSTAT) 0.4 MG SL tablet Place 1 tablet (0.4 mg total) under the tongue every 5 (five) minutes as needed. 08/12/11   Burnice Logan, MD  venlafaxine XR (EFFEXOR-XR) 150 MG 24 hr capsule Take 1 capsule by mouth 2 (two) times daily. 09/26/15   [provider]    Allergies    Ace inhibitors, Atorvastatin, Prednisone, Septra [sulfamethoxazole-trimethoprim], and Metformin and related  Review of Systems   Review of Systems  Constitutional: Negative for chills and fever.  HENT: Negative for congestion.   Respiratory: Negative for shortness of breath.     Cardiovascular: Negative for chest pain.  Gastrointestinal: Negative for abdominal pain.  Genitourinary: Negative for enuresis.  Musculoskeletal: Positive for back pain.       Right-sided rib pain.  Skin: Negative for rash.  Neurological: Negative for dizziness and headaches.  Hematological: Does not bruise/bleed easily.    Physical Exam Updated Vital Signs BP 116/73 (BP Location: Right Arm)   Pulse (!) 107   Temp 98.9 F (37.2 C) (Oral)   Resp 20   Ht 6' (1.829 m)   Wt 129.3 kg   SpO2 97%   BMI 38.65 kg/m   Physical Exam Vitals and nursing note reviewed.  Constitutional:      General: He is not in acute distress.    Appearance: He is not ill-appearing.  HENT:     Head: Normocephalic and atraumatic.  Nose: No congestion.  Eyes:     Conjunctiva/sclera: Conjunctivae normal.  Cardiovascular:     Rate and Rhythm: Normal rate and regular rhythm.     Pulses: Normal pulses.     Heart sounds: No murmur heard.  No friction rub. No gallop.   Pulmonary:     Effort: No respiratory distress.     Breath sounds: No wheezing, rhonchi or rales.     Comments: Patient chest was visualized she had good rise and fall during respirations, he did not appear to be in acute respiratory distress.  On exam he had slight tenderness to palpation along the mid axillary line of the fifth and sixth rib.  No crepitus or deformity noted on exam. Abdominal:     Palpations: Abdomen is soft.     Tenderness: There is no abdominal tenderness.  Musculoskeletal:     Right lower leg: No edema.     Left lower leg: No edema.     Comments: Patient had full range of motion, 5/5 strength, neurovascular intact in all 4 extremities.  Skin:    General: Skin is warm and dry.  Neurological:     Mental Status: He is alert.  Psychiatric:        Mood and Affect: Mood normal.     ED Results / Procedures / Treatments   Labs (all labs ordered are listed, but only abnormal results are displayed) Labs Reviewed  - No data to display  EKG None  Radiology DG Ribs Unilateral W/Chest Right  Result Date: 02/14/2020 CLINICAL DATA:  54 year old male status post fall backwards last night. Right rib pain. EXAM: RIGHT RIBS AND CHEST - 3+ VIEW COMPARISON:  Chest radiographs 05/10/2016 and earlier. FINDINGS: Slightly lower lung volumes. Mediastinal contours remain normal. Visualized tracheal air column is within normal limits. Both lungs appear stable and clear. No pneumothorax or pleural effusion. Oblique views of the right ribs. Marked area of clinical concern lateral to the right 10th rib. No right rib fracture is identified. Other visible osseous structures appear intact, with widespread spinal endplate spurring. Negative visible bowel gas pattern. IMPRESSION: 1. No right rib fracture identified radiographically. 2. No acute cardiopulmonary abnormality. Electronically Signed   By: Genevie Ann M.D.   On: 02/14/2020 17:25    Procedures Procedures (including critical care time)  Medications Ordered in ED Medications - No data to display  ED Course  I have reviewed the triage vital signs and the nursing notes.  Pertinent labs & imaging results that were available during my care of the patient were reviewed by me and considered in my medical decision making (see chart for details).    MDM Rules/Calculators/A&P                          Patient presents after having a mechanical fall.  He is alert, does not appear acute distress, vital signs significant for tachycardia will order imaging of ribs for further evaluation.  Imaging of right ribs did not reveal any acute findings.  Low suspicion for ACS or cardiac abnormality as patient denies chest pain, shortness of breath, no signs of hypoperfusion fluid overload noted on exam. I have low suspicion for pneumothorax, contusion, rib fractures as lung sounds are clear bilaterally, x-ray does not show any acute findings.  Low suspicion for CVA or intracranial head  bleed as patient denies head trauma, headaches, paresthesias or weakness in the upper or lower extremities, patient is moving all 4  extremities out difficulty, no difficulty with word finding. Will defer further imaging like CT chest as patient is not in acute distress, breathing without any difficulties, there is no indication for hospital admission.  I suspect patient may have possible bruising of rib or rib fracture that cannot be identified on x-ray.  will treat him as if he has a rib fracture provide him with incentive spirometry, over-the-counter pain medications follow-up with PCP for further evaluation.  Vital signs have remained stable, no indication for hospital admission.  Patient discussed with attending and they agreed with assessment and plan.  Patient given at home care as well strict return precautions.  Patient verbalized that they understood agreed to said plan.  Final Clinical Impression(s) / ED Diagnoses Final diagnoses:  Fall, initial encounter    Rx / DC Orders ED Discharge Orders    None       Marcello Fennel, PA-C 02/14/20 1838    Marcello Fennel, PA-C 02/14/20 [place], [place], DO 02/14/20 2022

## 2020-02-20 ENCOUNTER — Ambulatory Visit: Payer: Medicare HMO | Admitting: Family Medicine

## 2020-02-20 NOTE — Progress Notes (Deleted)
OFFICE VISIT  02/20/2020  CC: No chief complaint on file.   HPI:    Patient is a 54 y.o. Caucasian male who presents for 4 mo f/u DM 2, HTN, HLD A/P as of last visit: "1) DM 2: poor control.  Noncompliant with f/u. Need to get him back on some morning 70/30. SOunds like home monitoring showing fair glucoses but we'll see how his a1c correlates and make any insulin changes based on this. He has DPN but feet w/out ulcers or worrisome lesion. He does have nonpalpable pulses in both LL's and gives hx vaguely suggestive of claudication (fatigue above knees>below knees). Will likely pursue ABIs near future but will try to get plans re-established for his DM and HTN first. Hba1c, lipids, urine microalb/cr, lytes/cr today.  2) HTN: get back on lopressor 50 mg bid. Continue lisin $RemoveBefore'5mg'oIEBgVImlPdyX$  qd. Lytes/cr today.  3) HLD: atorva intol.   This prob on hold until we can get him improved from DM standpoint. Simplicity a big need for this case.  4) Subclin hypoth: thyroid panel today.  5) Hx of vit B12 def: not on any supplement. Recheck b12 level today.  6) Chronic pain syndrome: LBP/DDD, hx of discectomy and fixation. Pain mgmt as per pain specialist.  7) Prostate ca screening: PSA today."  INTERIM HX: Labs showed improved a1c last visit but still too high, 10.0%-->recommended 70/30 50 UqAM and 20 U qPM. Some microalbuminuria was present so I increased his lisinopril to $RemoveBefor'10mg'pHUKZwMlkodJ$  daily.   Past Medical History:  Diagnosis Date  . B12 deficiency   . Chronic low back pain    Dr. Hardin Negus is pain mgmt MD  . Diabetes mellitus with complication (HCC)    DPN + microalbuminuria  . GERD (gastroesophageal reflux disease)   . Hypercholesterolemia    Only mild elevation but statin indicated due to comorbid DM.  Atorva -->intol (myalgias).  Will start new statin after getting glucoses better controlled (2020, 10/2019))  . Hypertension   . OSA (obstructive sleep apnea)    on nighttime home O2  (refuses to wear CPAP because of claustrophobia)  . 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 Persistent asthma    Never smoker  . Stable angina (HCC)    nl myoview 12/07  followed by Dr Johnsie Cancel  . Subclinical hypothyroidism 06/2017    Past Surgical History:  Procedure Laterality Date  . LUMBAR SPINE SURGERY     lumbar laminectomy with hardware stabilization, with ensuing arachnoiditis  . [place] and 2004.   Laser surgery.    Outpatient Medications Prior to Visit  Medication Sig Dispense Refill  . blood glucose meter kit and supplies KIT Dispense based on patient and insurance preference. Use to check glucose 4 times daily. 1 each 0  . esomeprazole (NEXIUM) 40 MG capsule Take 1 capsule (40 mg total) by mouth daily. 90 capsule 3  . furosemide (LASIX) 20 MG tablet TAKE 1 TABLET BY MOUTH EVERY MORNING AS NEEDED FOR SWELLING. PLEASE MAKE APPT 90 tablet 1  . insulin NPH-regular Human (NOVOLIN 70/30) (70-30) 100 UNIT/ML injection INJECT 70 UNITS INTO THE SKIN EVERY MORNING AND 50 UNITS AT SUPPER. **NEED OFFICE VISIT NEEDED** 50 mL 0  . Insulin Pen Needle (PEN NEEDLES 31GX5/16") 31G X 8 MM MISC Use as directed once a day for medication injection 100 each 1  . lisinopril (ZESTRIL) 10 MG tablet Take 1 tablet (10 mg total) by mouth daily. 30 tablet 6  .  methocarbamol (ROBAXIN) 500 MG tablet Take 500 mg by mouth 3 (three) times daily as needed.    . metoprolol tartrate (LOPRESSOR) 50 MG tablet Take 1 tablet (50 mg total) by mouth 2 (two) times daily. 60 tablet 4  . morphine (MS CONTIN) 30 MG 12 hr tablet Take by mouth.    . Multiple Vitamin (MULTIVITAMIN) tablet Take 1 tablet by mouth daily.   (Patient not taking: Reported on 10/18/2019)    . nitroGLYCERIN (NITROSTAT) 0.4 MG SL tablet Place 1 tablet (0.4 mg total) under the tongue every 5 (five) minutes as needed. 30 tablet 1  . venlafaxine XR (EFFEXOR-XR) 150 MG 24 hr capsule Take 1 capsule  by mouth 2 (two) times daily.  3   No facility-administered medications prior to visit.    Allergies  Allergen Reactions  . Ace Inhibitors     Kidney failure  . Atorvastatin Other (See Comments)    Arm pain  . Prednisone     REACTION: Anxiety  . Septra [Sulfamethoxazole-Trimethoprim]   . Metformin And Related Diarrhea    ROS As per HPI  PE: Vitals with BMI 02/14/2020 02/14/2020 02/14/2020  Height - - $R'6\' 0"'xg$   Weight - - 285 lbs  BMI - - 32.35  Systolic 573 220 -  Diastolic 73 69 -  Pulse 254 109 -     ***  LABS:  Lab Results  Component Value Date   TSH 1.68 10/18/2019   Lab Results  Component Value Date   WBC 14.7 (H) 06/07/2017   HGB 15.6 06/07/2017   HCT 47.3 06/07/2017   MCV 84.6 06/07/2017   PLT 444.0 (H) 06/07/2017   Lab Results  Component Value Date   CREATININE 0.81 10/18/2019   BUN 17 10/18/2019   NA 139 10/18/2019   K 4.5 10/18/2019   CL 99 10/18/2019   CO2 29 10/18/2019   Lab Results  Component Value Date   ALT 13 10/18/2019   AST 13 10/18/2019   ALKPHOS 63 07/12/2018   BILITOT 0.6 10/18/2019   Lab Results  Component Value Date   CHOL 163 10/18/2019   Lab Results  Component Value Date   HDL 51 10/18/2019   Lab Results  Component Value Date   LDLCALC 95 10/18/2019   Lab Results  Component Value Date   TRIG 83 10/18/2019   Lab Results  Component Value Date   CHOLHDL 3.2 10/18/2019   Lab Results  Component Value Date   PSA 0.2 10/18/2019   PSA 0.27 06/07/2017   Lab Results  Component Value Date   HGBA1C 10.0 (H) 10/18/2019   IMPRESSION AND PLAN:  No problem-specific Assessment & Plan notes found for this encounter.  A1c, microalb/cr, and bmet today. Cont to put off statin discussion until his dm gets under decent control.  An After Visit Summary was printed and given to the patient.  FOLLOW UP: No follow-ups on file.  Signed:  Crissie Sickles, MD           02/20/2020

## 2020-03-13 ENCOUNTER — Other Ambulatory Visit: Payer: Self-pay

## 2020-03-13 ENCOUNTER — Ambulatory Visit (INDEPENDENT_AMBULATORY_CARE_PROVIDER_SITE_OTHER): Payer: Medicare Other | Admitting: Family Medicine

## 2020-03-13 ENCOUNTER — Encounter: Payer: Self-pay | Admitting: Family Medicine

## 2020-03-13 VITALS — BP 160/80 | HR 79 | Temp 97.3°F | Resp 16 | Ht 72.0 in | Wt 296.8 lb

## 2020-03-13 DIAGNOSIS — E1149 Type 2 diabetes mellitus with other diabetic neurological complication: Secondary | ICD-10-CM

## 2020-03-13 DIAGNOSIS — E7800 Pure hypercholesterolemia, unspecified: Secondary | ICD-10-CM | POA: Diagnosis not present

## 2020-03-13 DIAGNOSIS — I10 Essential (primary) hypertension: Secondary | ICD-10-CM | POA: Diagnosis not present

## 2020-03-13 DIAGNOSIS — E119 Type 2 diabetes mellitus without complications: Secondary | ICD-10-CM | POA: Diagnosis not present

## 2020-03-13 DIAGNOSIS — R635 Abnormal weight gain: Secondary | ICD-10-CM

## 2020-03-13 LAB — POCT GLYCOSYLATED HEMOGLOBIN (HGB A1C)
HbA1c POC (<> result, manual entry): 10.3 % (ref 4.0–5.6)
HbA1c, POC (controlled diabetic range): 10.3 % — AB (ref 0.0–7.0)
HbA1c, POC (prediabetic range): 10.3 % — AB (ref 5.7–6.4)
Hemoglobin A1C: 10.3 % — AB (ref 4.0–5.6)

## 2020-03-13 LAB — GLUCOSE, POCT (MANUAL RESULT ENTRY): POC Glucose: 97 mg/dl (ref 70–99)

## 2020-03-13 MED ORDER — TRESIBA FLEXTOUCH 100 UNIT/ML ~~LOC~~ SOPN: PEN_INJECTOR | SUBCUTANEOUS | 6 refills | Status: DC

## 2020-03-13 NOTE — Progress Notes (Signed)
See student note from same date. I personally was present during the history, physical exam, and medical decision-making activities of this service and have verified that the service and findings are accurately documented in the student's note. Signed:  Crissie Sickles, MD           03/13/2020

## 2020-03-13 NOTE — Progress Notes (Signed)
CC: 5 month f/u DM, HTN, HLD, and morbid obesity  HPI:  Dylan Owen is a 54 yo male who presents to the clinic today for 5 month f/u for DM, HTN, HLD, and morbid obesity.  Patient reports that four weeks ago, he fell fractured ribs and injured his back again causing him significant pain on top of chronic back pain. Seen by [facility], chronic pain still being managed by pain management specialist. Patient reports pain from fall improving.  Patient reports taking 60 units of 70/30 in the morning and 40 units at night. He says that his home glucose levels are usually in the 90-110 range. Today he did not eat this morning because of the doctors appointment, but took his insulin causing glucose level to be 50. Patient subsequently ate ice cream to raise glucose levels. Discussed with patient the importance of eating if giving insulin to oneself to avoid hypoglycemia. Patient denies nerve-like shooting pain in legs, but states that he has not been able to feel anything below his knees for a while. Patient denies any new leg/foot rashes or ulcerations. Discussed with patient the importance of routine self-foot examinations.  Of note, patient gained 28 lbs since 5 months ago. Patient previously weighed in the high 300 lbs had lost a significant amount of weight. Patient concerned weight gained caused by insulin. Patient states that he has tried to decrease meal size to help lose weight. For example, he states where he used to eat three sandwiches he now will eat only one. He states he was able to lose weight with this diet until taking insulin regularly.   Patient does not check BP at home regularly. He states that it runs "high" when he is pain, but is unable to provide quantitative values.  HLD: hx of atorva intol.  Have put off trial of diff statin b/c focusing on trying to get DM better controlled.     PMH: Past Medical History:  Diagnosis Date  . B12 deficiency   . Chronic low  back pain    Dr. Vear Clock is pain mgmt MD  . Diabetes mellitus with complication (HCC)    DPN + microalbuminuria  . GERD (gastroesophageal reflux disease)   . Hypercholesterolemia    Only mild elevation but statin indicated due to comorbid DM.  Atorva -->intol (myalgias).  Will start new statin after getting glucoses better controlled (2020, 10/2019))  . Hypertension   . OSA (obstructive sleep apnea)    on nighttime home O2 (refuses to wear CPAP because of claustrophobia)  . 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 Persistent asthma    Never smoker  . Stable angina (HCC)    nl myoview 12/07  followed by Dr Eden Emms  . Subclinical hypothyroidism 06/2017    M/A: Current Outpatient Medications on File Prior to Visit  Medication Sig Dispense Refill  . blood glucose meter kit and supplies KIT Dispense based on patient and insurance preference. Use to check glucose 4 times daily. 1 each 0  . esomeprazole (NEXIUM) 40 MG capsule Take 1 capsule (40 mg total) by mouth daily. 90 capsule 3  . furosemide (LASIX) 20 MG tablet TAKE 1 TABLET BY MOUTH EVERY MORNING AS NEEDED FOR SWELLING. PLEASE MAKE APPT 90 tablet 1  . insulin NPH-regular Human (NOVOLIN 70/30) (70-30) 100 UNIT/ML injection INJECT 70 UNITS INTO THE SKIN EVERY MORNING AND 50 UNITS AT SUPPER. **NEED OFFICE VISIT NEEDED** 50 mL 0  .  Insulin Pen Needle (PEN NEEDLES 31GX5/16") 31G X 8 MM MISC Use as directed once a day for medication injection 100 each 1  . lisinopril (ZESTRIL) 10 MG tablet Take 1 tablet (10 mg total) by mouth daily. 30 tablet 6  . methocarbamol (ROBAXIN) 500 MG tablet Take 500 mg by mouth 3 (three) times daily as needed.    . metoprolol tartrate (LOPRESSOR) 50 MG tablet Take 1 tablet (50 mg total) by mouth 2 (two) times daily. 60 tablet 4  . morphine (MS CONTIN) 30 MG 12 hr tablet Take by mouth.    . Multiple Vitamin (MULTIVITAMIN) tablet Take 1 tablet by mouth daily.   (Patient not taking:  Reported on 10/18/2019)    . nitroGLYCERIN (NITROSTAT) 0.4 MG SL tablet Place 1 tablet (0.4 mg total) under the tongue every 5 (five) minutes as needed. 30 tablet 1  . venlafaxine XR (EFFEXOR-XR) 150 MG 24 hr capsule Take 1 capsule by mouth 2 (two) times daily.  3   No current facility-administered medications on file prior to visit.   Allergies  Allergen Reactions  . Ace Inhibitors     Kidney failure  . Atorvastatin Other (See Comments)    Arm pain  . Prednisone     REACTION: Anxiety  . Septra [Sulfamethoxazole-Trimethoprim]   . Metformin And Related Diarrhea    FH: Family History  Problem Relation Age of Onset  . Heart disease Other   . Hypertension Other   . Other Other        Emotional Illness  . Cancer Paternal Grandfather        prostate    SH: Social History   Socioeconomic History  . Marital status: Married    Spouse name: Not on file  . Number of children: Not on file  . Years of education: Not on file  . Highest education level: Not on file  Occupational History  . Not on file  Tobacco Use  . Smoking status: Never Smoker  . Smokeless tobacco: Current User  . Tobacco comment: dip  Substance and Sexual Activity  . Alcohol use: No  . Drug use: No  . Sexual activity: Not on file  Other Topics Concern  . Not on file  Social History Narrative   Married, 4 children.   Occupation: disabled since 2000.     Prior to disability he worked for [employer].   Orig from [place].   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  No hx of drug abuse.   Chews tobacco since age 11 yrs.   Regular exercise:  No               Social Determinants of Radio broadcast assistant Strain: Not on file  Food Insecurity: Not on file  Transportation Needs: Not on file  Physical Activity: Not on file  Stress: Not on file  Social Connections: Not on file    ROS: Review of Systems  Eyes: Negative for  blurred vision.  Cardiovascular: Negative for chest pain, palpitations and leg swelling.  Musculoskeletal: Positive for back pain.  Skin: Negative for rash.  Neurological: Negative for dizziness, sensory change, weakness and headaches.    PE: Vitals with BMI 02/14/2020 02/14/2020 02/14/2020  Height - - $R'6\' 0"'Hh$   Weight - - 285 lbs  BMI - - 86.76  Systolic 195 093 -  Diastolic 73 69 -  Pulse 267 109 -  Body mass index is 40.25 kg/m.  Physical Exam Constitutional:      General: He is not in acute distress.    Appearance: Normal appearance. He is obese.  HENT:     Head: Normocephalic and atraumatic.  Cardiovascular:     Rate and Rhythm: Normal rate and regular rhythm.     Heart sounds: Normal heart sounds. No murmur heard. No friction rub. No gallop.   Pulmonary:     Effort: Pulmonary effort is normal.     Breath sounds: Normal breath sounds.  Musculoskeletal:     Right lower leg: No edema.     Left lower leg: No edema.  Neurological:     Mental Status: He is alert and oriented to person, place, and time.     Labs: No results found for this or any previous visit (from the past 2160 hour(s)).   A/P: In summary, Dylan Owen is a 54 y.o. year old male who presents for 5 month f/u for HTN, DM, HLD, and morbid obesity. Physical exam unremarkable.  1) DM 2: poor control (with DPN). Hgb A1c 10% 5 months ago (10/18/19), overall improved from 13.5% (07/12/18). Patient reports blood glucose readings 90-110 range throughout the day. Point of care blood glucose is 97 today. Point of care A1c revealed A1c level of 10.3%. This a1c does not indicate current insulin and diet regimen managing disease well. Patient not experiencing any peripheral neuropathy. Has loss of sensation in lower legs, but this is not acute and has remained stable. No ulcerations/rashes of the legs or feet at this time. Need to try to simplify regimen as much as possible (past meds tried->metformin (diarrhea),  januvia, lantus, victoza, glipizide.  Invokana rx'd but insurance no cover).  Pt very concerned that his 70/30 insulin is causing his excessive wt gain (28 lbs up in the last 5 mo).  - Stop 70/30 insulin. - Start Tresiba 70 units SQ qd. - Patient will record blood glucose every morning before breakfast, before lunch, before dinner, and at bedtime and bring numbers to next visit in 3 weeks.  Would like to add SGL2-I as next step if insurance cooperates.  - Hgb A1c today, lytes/cr today.   2) HTN: BP today 160/80. Patient inconsistent with home BP readings. Patient reports BP elevated when he is in pain. Difficult to ascertain BP at this time as patient not able to recall BP measurements at home. Will need to address in the future, would like to get DM management better controlled at this time. Situation would benefit from a more simplistic approach to disease management. - Continue lopressor 50 mg bid. - Continue lisin $RemoveBefore'10mg'ekiDnHSbBWXtE$  qd. - Lytes/cr today.  3) HLD: atorva intol. LDL 95 mg/dL on 10/18/19. FLP wnl 10/18/19. - Will reassess in the future.  4) Subclin hypoth: thyroid panel five months (10/18/19) ago wnl. No further workup at this time.  5) Hx of vit B12 def: B12 levels normal five months ago. No further workup at this time.  6) Chronic pain syndrome: LBP/DDD, hx of discectomy and fixation. Pain mgmt as per pain specialist.   Follow Up:  3 weeks for poorly controlled DM Signed: Nanetta Batty, MS3  I personally was present during the history, physical exam, and medical decision-making activities of this service and have verified that the service and findings are accurately documented in the student's note. Signed:  Crissie Sickles, MD           03/13/2020

## 2020-03-13 NOTE — Patient Instructions (Addendum)
STOP your current insulin (Novolin 70/30). Start the new insulin I sent to your pharmacy called Tresiba--70 Units once a day.  Check sugar every morning before breakfast, before lunch, before supper, and at bedtime. Write the numbers down so we can review them at office follow up in 3 weeks.

## 2020-03-14 LAB — BASIC METABOLIC PANEL
BUN: 16 mg/dL (ref 6–23)
CO2: 28 mEq/L (ref 19–32)
Calcium: 9.1 mg/dL (ref 8.4–10.5)
Chloride: 100 mEq/L (ref 96–112)
Creatinine, Ser: 0.69 mg/dL (ref 0.40–1.50)
GFR: 105.39 mL/min (ref >60.00)
Glucose, Bld: 68 mg/dL — ABNORMAL LOW (ref 70–99)
Potassium: 4.3 mEq/L (ref 3.5–5.1)
Sodium: 136 mEq/L (ref 135–145)

## 2020-03-14 LAB — HEMOGLOBIN A1C: Hgb A1c MFr Bld: 10.4 % — ABNORMAL HIGH (ref 4.6–6.5)

## 2020-03-22 ENCOUNTER — Other Ambulatory Visit: Payer: Self-pay | Admitting: Family Medicine

## 2020-04-03 ENCOUNTER — Other Ambulatory Visit: Payer: Self-pay

## 2020-04-03 ENCOUNTER — Encounter: Payer: Self-pay | Admitting: Family Medicine

## 2020-04-03 ENCOUNTER — Ambulatory Visit (INDEPENDENT_AMBULATORY_CARE_PROVIDER_SITE_OTHER): Payer: Medicare Other | Admitting: Family Medicine

## 2020-04-03 VITALS — BP 143/83 | HR 105 | Temp 98.0°F | Resp 16 | Ht 72.0 in | Wt 297.6 lb

## 2020-04-03 DIAGNOSIS — I10 Essential (primary) hypertension: Secondary | ICD-10-CM

## 2020-04-03 DIAGNOSIS — E1149 Type 2 diabetes mellitus with other diabetic neurological complication: Secondary | ICD-10-CM

## 2020-04-03 DIAGNOSIS — E669 Obesity, unspecified: Secondary | ICD-10-CM

## 2020-04-03 DIAGNOSIS — E119 Type 2 diabetes mellitus without complications: Secondary | ICD-10-CM

## 2020-04-03 MED ORDER — TRESIBA FLEXTOUCH 100 UNIT/ML ~~LOC~~ SOPN
PEN_INJECTOR | SUBCUTANEOUS | 6 refills | Status: DC
Start: 2020-04-03 — End: 2020-08-20

## 2020-04-03 MED ORDER — "PEN NEEDLES 5/16"" 31G X 8 MM MISC": 1 refills | Status: DC

## 2020-04-03 MED ORDER — PHENTERMINE HCL 37.5 MG PO CAPS: 37.5000 mg | ORAL_CAPSULE | ORAL | 0 refills | Status: DC

## 2020-04-03 NOTE — Progress Notes (Signed)
OFFICE VISIT  04/03/2020  CC:  Chief Complaint  Patient presents with   Follow-up    DM   HPI:    Patient is a 54 y.o. Caucasian male who presents for 3 week f/u uncontrolled DM. A/P as of last visit: "1) DM 2: poor control (with DPN). Hgb A1c 10% 5 months ago (10/18/19), overall improved from 13.5% (07/12/18). Patient reports blood glucose readings 90-110 range throughout the day. Point of care blood glucose is 97 today. Point of care A1c revealed A1c level of 10.3%. This a1c does not indicate current insulin and diet regimen managing disease well. Patient not experiencing any peripheral neuropathy. Has loss of sensation in lower legs, but this is not acute and has remained stable. No ulcerations/rashes of the legs or feet at this time. Need to try to simplify regimen as much as possible (past meds tried->metformin (diarrhea), januvia, lantus, victoza, glipizide.  Invokana rx'd but insurance no cover).  Pt very concerned that his 70/30 insulin is causing his excessive wt gain (28 lbs up in the last 5 mo).  - Stop 70/30 insulin. - Start Tresiba 70 units SQ qd. - Patient will record blood glucose every morning before breakfast, before lunch, before dinner, and at bedtime and bring numbers to next visit in 3 weeks.  Would like to add SGL2-I as next step if insurance cooperates.  - Hgb A1c today, lytes/cr today.   2) HTN: BP today 160/80. Patient inconsistent with home BP readings. Patient reports BP elevated when he is in pain. Difficult to ascertain BP at this time as patient not able to recall BP measurements at home. Will need to address in the future, would like to get DM management better controlled at this time. Situation would benefit from a more simplistic approach to disease management. - Continue lopressor 50 mg bid. - Continue lisin 35m qd. - Lytes/cr today.  3) HLD: atorva intol. LDL 95 mg/dL on 10/18/19. FLP wnl 10/18/19. - Will reassess in the future.  4) Subclin  hypoth: thyroid panel five months (10/18/19) ago wnl. No further workup at this time.  5) Hx of vit B12 def: B12 levels normal five months ago. No further workup at this time.  6) Chronic pain syndrome: LBP/DDD, hx of discectomy and fixation. Pain mgmt as per pain specialist."  INTERIM HX: Feeling fine. Glucoses still reading 300+ consistently at all times of the day.  Says he is very hungry all the time. Taking otc "fat burner" for years, also apple cider vinegar to try to quell appetite but has always struggled with this.  He recalls being on rx appetite in the remote past and thinks it helped some. Exercise: walking at the most, back pain limits exercise.    Labs last visit all stable other than a1c 10.4%.   Past Medical History:  Diagnosis Date   B12 deficiency    Chronic low back pain    Dr. PHardin Negusis pain mgmt MD   Diabetes mellitus with complication ([place]    DPN + microalbuminuria   GERD (gastroesophageal reflux disease)    Hypercholesterolemia    Only mild elevation but statin indicated due to comorbid DM.  Atorva -->intol (myalgias).  Will start new statin after getting glucoses better controlled (2020, 10/2019))   Hypertension    OSA (obstructive sleep apnea)    on nighttime home O2 (refuses to wear CPAP because of claustrophobia)   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 + chronic lumbar radiculopathy   Persistent asthma  Never smoker   Stable angina ([place])    nl myoview 12/07  followed by Dr Johnsie Cancel   Subclinical hypothyroidism 06/2017    Past Surgical History:  Procedure Laterality Date   LUMBAR SPINE SURGERY     lumbar laminectomy with hardware stabilization, with ensuing arachnoiditis   URETHRAL STRICTURE DILATATION  1996 and 2004.   Laser surgery.    Outpatient Medications Prior to Visit  Medication Sig Dispense Refill   Acetaminophen (TYLENOL PO) Take by mouth as needed.     blood glucose meter kit and supplies KIT Dispense based on  patient and insurance preference. Use to check glucose 4 times daily. 1 each 0   esomeprazole (NEXIUM) 40 MG capsule Take 1 capsule (40 mg total) by mouth daily. 90 capsule 3   furosemide (LASIX) 20 MG tablet TAKE 1 TABLET BY MOUTH EVERY MORNING AS NEEDED FOR SWELLING. PLEASE MAKE APPT 90 tablet 1   Insulin Pen Needle (PEN NEEDLES 31GX5/16") 31G X 8 MM MISC Use as directed once a day for medication injection 100 each 1   lisinopril (ZESTRIL) 10 MG tablet Take 1 tablet (10 mg total) by mouth daily. 30 tablet 6   methocarbamol (ROBAXIN) 500 MG tablet Take 500 mg by mouth 3 (three) times daily as needed.     metoprolol tartrate (LOPRESSOR) 50 MG tablet Take 1 tablet (50 mg total) by mouth 2 (two) times daily. 60 tablet 4   morphine (MS CONTIN) 30 MG 12 hr tablet Take by mouth.     Multiple Vitamin (MULTIVITAMIN) tablet Take 1 tablet by mouth daily.     venlafaxine XR (EFFEXOR-XR) 150 MG 24 hr capsule Take 1 capsule by mouth 2 (two) times daily.  3   insulin degludec (TRESIBA FLEXTOUCH) 100 UNIT/ML FlexTouch Pen 70 U SQ ONCE DAILY 15 mL 6   nitroGLYCERIN (NITROSTAT) 0.4 MG SL tablet Place 1 tablet (0.4 mg total) under the tongue every 5 (five) minutes as needed. (Patient not taking: No sig reported) 30 tablet 1   No facility-administered medications prior to visit.    Allergies  Allergen Reactions   Ace Inhibitors     Kidney failure   Atorvastatin Other (See Comments)    Arm pain   Prednisone     REACTION: Anxiety   Septra [Sulfamethoxazole-Trimethoprim]    Metformin And Related Diarrhea    ROS As per HPI  PE: Vitals with BMI 04/03/2020 03/13/2020 02/14/2020  Height 6' 0" 6' 0" -  Weight 297 lbs 10 oz 296 lbs 13 oz -  BMI 57.26 20.35 -  Systolic 597 416 384  Diastolic 83 80 73  Pulse 536 79 107     Gen: Alert, well appearing.  Patient is oriented to person, place, time, and situation. AFFECT: pleasant, lucid thought and speech. No further exam today.  LABS:     Chemistry      Component Value Date/Time   NA 136 03/13/2020 1634   K 4.3 03/13/2020 1634   CL 100 03/13/2020 1634   CO2 28 03/13/2020 1634   BUN 16 03/13/2020 1634   CREATININE 0.69 03/13/2020 1634   CREATININE 0.81 10/18/2019 1428      Component Value Date/Time   CALCIUM 9.1 03/13/2020 1634   ALKPHOS 63 07/12/2018 0951   AST 13 10/18/2019 1428   ALT 13 10/18/2019 1428   BILITOT 0.6 10/18/2019 1428     Lab Results  Component Value Date   HGBA1C 10.4 (H) 03/13/2020   Lab Results  Component Value  Date   CHOL 163 10/18/2019   HDL 51 10/18/2019   LDLCALC 95 10/18/2019   LDLDIRECT 166.2 11/09/2013   TRIG 83 10/18/2019   CHOLHDL 3.2 10/18/2019   Lab Results  Component Value Date   WBC 14.7 (H) 06/07/2017   HGB 15.6 06/07/2017   HCT 47.3 06/07/2017   MCV 84.6 06/07/2017   PLT 444.0 (H) 06/07/2017   IMPRESSION AND PLAN:  1) DM 2, poor control. Recent change-over from 70/30 insulin to tresiba. Pt desiring divided dosing of this med and I'm ok with this. Instructions: Take 42 units of tresiba insulin every morning and 42 units every evening. Increase by 1 unit at each dose every day until your fasting sugar is in the 100-120 range consistently.  2) Obesity, polyphagia. I'd rather he take phentermine for appetite suppression than his otc "fat burner" medicine. Of note, he tends to always run a fast HR (100 or so). Start phentermine 37.35m, 1 qd. Therapeutic expectations and side effect profile of medication discussed today.  Patient's questions answered. If this ends up working out for him then [facility].  3) HTN, not ideal control. Once we're to the point of not making significant adjustments in his dm regimen then I'll focus on bp management.  I want to keep things simple with him for now. Continue lisinopril 178mqd and lopressor 5019mid.  An After Visit Summary was printed and given to the patient.  FOLLOW UP: Return in about 3 weeks (around  04/24/2020) for f/u DM/HTN.  Signed:  PhiCrissie SicklesD           04/03/2020

## 2020-04-03 NOTE — Patient Instructions (Signed)
Take 42 units of tresiba insulin every morning and 42 units every evening. Increase by 1 unit at each dose every day until your fasting sugar is in the 100-120 range consistently.  Stop your over the counter "fat burner" medicine. I sent in prescription for phentermine to try to suppress your appetite.

## 2020-04-22 ENCOUNTER — Other Ambulatory Visit: Payer: Self-pay

## 2020-04-23 DIAGNOSIS — Z79891 Long term (current) use of opiate analgesic: Secondary | ICD-10-CM | POA: Diagnosis not present

## 2020-04-23 DIAGNOSIS — G894 Chronic pain syndrome: Secondary | ICD-10-CM | POA: Diagnosis not present

## 2020-04-23 DIAGNOSIS — M961 Postlaminectomy syndrome, not elsewhere classified: Secondary | ICD-10-CM | POA: Diagnosis not present

## 2020-04-23 DIAGNOSIS — E1142 Type 2 diabetes mellitus with diabetic polyneuropathy: Secondary | ICD-10-CM | POA: Diagnosis not present

## 2020-04-24 ENCOUNTER — Other Ambulatory Visit: Payer: Self-pay

## 2020-04-24 ENCOUNTER — Ambulatory Visit (INDEPENDENT_AMBULATORY_CARE_PROVIDER_SITE_OTHER): Payer: HMO | Admitting: Family Medicine

## 2020-04-24 ENCOUNTER — Encounter: Payer: Self-pay | Admitting: Family Medicine

## 2020-04-24 VITALS — BP 172/84 | HR 75 | Temp 98.6°F | Resp 16 | Ht 72.0 in | Wt 301.6 lb

## 2020-04-24 DIAGNOSIS — IMO0002 Reserved for concepts with insufficient information to code with codable children: Secondary | ICD-10-CM

## 2020-04-24 DIAGNOSIS — I10 Essential (primary) hypertension: Secondary | ICD-10-CM

## 2020-04-24 DIAGNOSIS — E118 Type 2 diabetes mellitus with unspecified complications: Secondary | ICD-10-CM

## 2020-04-24 DIAGNOSIS — E1165 Type 2 diabetes mellitus with hyperglycemia: Secondary | ICD-10-CM | POA: Diagnosis not present

## 2020-04-24 MED ORDER — FREESTYLE TEST VI STRP: ORAL_STRIP | 4 refills | Status: DC

## 2020-04-24 MED ORDER — PHENTERMINE HCL 37.5 MG PO CAPS
37.5000 mg | ORAL_CAPSULE | ORAL | 0 refills | Status: DC
Start: 2020-04-24 — End: 2020-06-12

## 2020-04-24 MED ORDER — LISINOPRIL 20 MG PO TABS: 20.0000 mg | ORAL_TABLET | Freq: Every day | ORAL | 11 refills | Status: DC

## 2020-04-24 NOTE — Progress Notes (Signed)
OFFICE VISIT  04/24/2020  CC:  Chief Complaint  Patient presents with  . Follow-up    DM and hypertension    HPI:    Patient is a 55 y.o. Caucasian male who presents for 3 week f/u uncontrolled DM and HTN and f/u recent start of phentermine for appetite suppression/obesity. A/P as of last visit: "1) DM 2, poor control. Recent change-over from 70/30 insulin to tresiba. Pt desiring divided dosing of this med and I'm ok with this. Instructions: Take 42 units of tresiba insulin every morning and 42 units every evening. Increase by 1 unit at each dose every day until your fasting sugar is in the 100-120 range consistently.  2) Obesity, polyphagia. I'd rather he take phentermine for appetite suppression than his otc "fat burner" medicine. Of note, he tends to always run a fast HR (100 or so). Start phentermine 37.5mg , 1 qd. Therapeutic expectations and side effect profile of medication discussed today.  Patient's questions answered. If this ends up working out for him then I'll get Casey for him.  3) HTN, not ideal control. Once we're to the point of not making significant adjustments in his dm regimen then I'll focus on bp management.  I want to keep things simple with him for now. Continue lisinopril 10mg  qd and lopressor 50mg  bid."  INTERIM HX: Doing fine.  DM: 46 U tresiba qAM and 44 U tresiba qPM. Fasting glucose 116-148. 14d avg 140 or so.  Obesity/appetite:  Appetite is down " a little" but he started taking phentermine bid on his own.  HTN: no home bp monitoring.   Past Medical History:  Diagnosis Date  . B12 deficiency   . Chronic low back pain    Dr. Hardin Negus is pain mgmt MD  . Diabetes mellitus with complication (HCC)    DPN + microalbuminuria  . GERD (gastroesophageal reflux disease)   . Hypercholesterolemia    Only mild elevation but statin indicated due to comorbid DM.  Atorva -->intol (myalgias).  Will start new statin after getting glucoses better  controlled (2020, 10/2019))  . Hypertension   . OSA (obstructive sleep apnea)    on nighttime home O2 (refuses to wear CPAP because of claustrophobia)  . 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 Persistent asthma    Never smoker  . Stable angina (HCC)    nl myoview 12/07  followed by Dr Johnsie Cancel  . Subclinical hypothyroidism 06/2017    Past Surgical History:  Procedure Laterality Date  . LUMBAR SPINE SURGERY     lumbar laminectomy with hardware stabilization, with ensuing arachnoiditis  . [place] and 2004.   Laser surgery.    Outpatient Medications Prior to Visit  Medication Sig Dispense Refill  . Acetaminophen (TYLENOL PO) Take by mouth as needed.    . blood glucose meter kit and supplies KIT Dispense based on patient and insurance preference. Use to check glucose 4 times daily. 1 each 0  . esomeprazole (NEXIUM) 40 MG capsule Take 1 capsule (40 mg total) by mouth daily. 90 capsule 3  . furosemide (LASIX) 20 MG tablet TAKE 1 TABLET BY MOUTH EVERY MORNING AS NEEDED FOR SWELLING. PLEASE MAKE APPT 90 tablet 1  . insulin degludec (TRESIBA FLEXTOUCH) 100 UNIT/ML FlexTouch Pen 42 Units SQ qAM and 42 U SQ qPM 15 mL 6  . Insulin Pen Needle (PEN NEEDLES 31GX5/16") 31G X 8 MM MISC Use as directed twice daily for medication injection 100  each 1  . methocarbamol (ROBAXIN) 500 MG tablet Take 500 mg by mouth 3 (three) times daily as needed.    . metoprolol tartrate (LOPRESSOR) 50 MG tablet Take 1 tablet (50 mg total) by mouth 2 (two) times daily. 60 tablet 4  . morphine (MS CONTIN) 30 MG 12 hr tablet Take by mouth.    . Multiple Vitamin (MULTIVITAMIN) tablet Take 1 tablet by mouth daily.    Marland Kitchen venlafaxine XR (EFFEXOR-XR) 150 MG 24 hr capsule Take 1 capsule by mouth 2 (two) times daily.  3  . lisinopril (ZESTRIL) 10 MG tablet Take 1 tablet (10 mg total) by mouth daily. 30 tablet 6  . phentermine 37.5 MG capsule Take 1 capsule (37.5 mg total)  by mouth every morning. 30 capsule 0  . nitroGLYCERIN (NITROSTAT) 0.4 MG SL tablet Place 1 tablet (0.4 mg total) under the tongue every 5 (five) minutes as needed. (Patient not taking: No sig reported) 30 tablet 1   No facility-administered medications prior to visit.    Allergies  Allergen Reactions  . Ace Inhibitors     Kidney failure  . Atorvastatin Other (See Comments)    Arm pain  . Prednisone     REACTION: Anxiety  . Septra [Sulfamethoxazole-Trimethoprim]   . Metformin And Related Diarrhea    ROS As per HPI  PE: Vitals with BMI 04/24/2020 04/03/2020 03/13/2020  Height $Remov'6\' 0"'bHzWnX$  $Remove'6\' 0"'ZUzAIMM$  $RemoveB'6\' 0"'VykHEDvt$   Weight 301 lbs 10 oz 297 lbs 10 oz 296 lbs 13 oz  BMI 40.9 40.98 11.91  Systolic 478 295 621  Diastolic 84 83 80  Pulse 75 105 79     Gen: Alert, well appearing.  Patient is oriented to person, place, time, and situation. AFFECT: pleasant, lucid thought and speech. No further exam today.  LABS:    Chemistry      Component Value Date/Time   NA 136 03/13/2020 1634   K 4.3 03/13/2020 1634   CL 100 03/13/2020 1634   CO2 28 03/13/2020 1634   BUN 16 03/13/2020 1634   CREATININE 0.69 03/13/2020 1634   CREATININE 0.81 10/18/2019 1428      Component Value Date/Time   CALCIUM 9.1 03/13/2020 1634   ALKPHOS 63 07/12/2018 0951   AST 13 10/18/2019 1428   ALT 13 10/18/2019 1428   BILITOT 0.6 10/18/2019 1428     Lab Results  Component Value Date   HGBA1C 10.4 (H) 03/13/2020   Lab Results  Component Value Date   CHOL 163 10/18/2019   HDL 51 10/18/2019   LDLCALC 95 10/18/2019   LDLDIRECT 166.2 11/09/2013   TRIG 83 10/18/2019   CHOLHDL 3.2 10/18/2019   Lab Results  Component Value Date   TSH 1.68 10/18/2019    IMPRESSION AND PLAN:  1) DM 2, much improved control on current regimen of tresiba 46 U qAM and 44 U qPM.  However, he is VERY hesitant to continue taking insulin b/c he is convinced it is the cause of his wt gain.  2) HTN: poor control. Inc lisinopril to $RemoveBefor'20mg'jCmqryIBGqvH$   qd.  3) Obesity: phentermine has helped just a little bit with appetite suppression. I told him to only take this ONCE per day. New rx sent in today.  I am skeptical that he will continue this med, but if he is continuing it at the time of next f/u in 6-8 wks then we'll do CSC.  Next a1c and BMET at f/u in 6-8 wks.  An After Visit Summary was printed  and given to the patient.  FOLLOW UP: Return for 6-8 wks f/u DM/HTN.  Signed:  Crissie Sickles, MD           04/24/2020

## 2020-06-12 ENCOUNTER — Encounter: Payer: Self-pay | Admitting: Family Medicine

## 2020-06-12 ENCOUNTER — Other Ambulatory Visit: Payer: Self-pay

## 2020-06-12 ENCOUNTER — Ambulatory Visit (INDEPENDENT_AMBULATORY_CARE_PROVIDER_SITE_OTHER): Payer: HMO | Admitting: Family Medicine

## 2020-06-12 VITALS — BP 149/78 | HR 80 | Temp 98.2°F | Resp 16 | Ht 72.0 in | Wt 303.0 lb

## 2020-06-12 DIAGNOSIS — I10 Essential (primary) hypertension: Secondary | ICD-10-CM | POA: Diagnosis not present

## 2020-06-12 DIAGNOSIS — IMO0002 Reserved for concepts with insufficient information to code with codable children: Secondary | ICD-10-CM

## 2020-06-12 DIAGNOSIS — E1165 Type 2 diabetes mellitus with hyperglycemia: Secondary | ICD-10-CM

## 2020-06-12 DIAGNOSIS — E118 Type 2 diabetes mellitus with unspecified complications: Secondary | ICD-10-CM | POA: Diagnosis not present

## 2020-06-12 NOTE — Progress Notes (Signed)
OFFICE VISIT  06/12/2020  CC:  Chief Complaint  Patient presents with  . Follow-up    DM, HTN. Pt is not fasting    HPI:    Patient is a 55 y.o. Caucasian male who presents for 7 wk f/u DM, HTN, obesity. A/P as of last visit: "1) DM 2, much improved control on current regimen of tresiba 46 U qAM and 44 U qPM.  However, he is VERY hesitant to continue taking insulin b/c he is convinced it is the cause of his wt gain.  2) HTN: poor control. Inc lisinopril to $RemoveBefor'20mg'GfXueKFzciTC$  qd.  3) Obesity: phentermine has helped just a little bit with appetite suppression. I told him to only take this ONCE per day. New rx sent in today.  I am skeptical that he will continue this med, but if he is continuing it at the time of next f/u in 6-8 wks then we'll do CSC.  Next a1c and BMET at f/u in 6-8 wks."  INTERIM HX: Feeling fine. Glucoses 120-140 "usually" per his memory-- taking tresiba 46 qAM (6-8AM) and 44 qPM (7-8pm). Typically eats 3 meals/day, supper is largest.  BP: no home monitoring.  Compliant with lisin 20 and metop 50 bid.  Takes $Remo'20mg'yIqhp$  lasix intermittently for inc LE swelling, sounds like only once every 2 wks or so.  Takes phentermine daily but hasn't noted any dec in appetite/PO intake. He is unable to exercise d/t chronic LB pain, can walk approx 50 feet w/out having to stop and rest.  ROS as above, plus--> no fevers, no CP, no SOB, no wheezing, no cough, no dizziness, no HAs, no rashes, no melena/hematochezia.  No polyuria or polydipsia.  No myalgias or arthralgias.  No focal weakness, paresthesias, or tremors.  No acute vision or hearing abnormalities.  No dysuria or unusual/new urinary urgency or frequency.  No recent changes in lower legs. No n/v/d or abd pain.  No palpitations.    Past Medical History:  Diagnosis Date  . B12 deficiency   . Chronic low back pain    Dr. Hardin Negus is pain mgmt MD  . Diabetes mellitus with complication (HCC)    DPN + microalbuminuria  . GERD  (gastroesophageal reflux disease)   . Hypercholesterolemia    Only mild elevation but statin indicated due to comorbid DM.  Atorva -->intol (myalgias).  Will start new statin after getting glucoses better controlled (2020, 10/2019))  . Hypertension   . OSA (obstructive sleep apnea)    on nighttime home O2 (refuses to wear CPAP because of claustrophobia)  . 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 Persistent asthma    Never smoker  . Stable angina (HCC)    nl myoview 12/07  followed by Dr Johnsie Cancel  . Subclinical hypothyroidism 06/2017    Past Surgical History:  Procedure Laterality Date  . LUMBAR SPINE SURGERY     lumbar laminectomy with hardware stabilization, with ensuing arachnoiditis  . [place] and 2004.   Laser surgery.    Outpatient Medications Prior to Visit  Medication Sig Dispense Refill  . Acetaminophen (TYLENOL PO) Take by mouth as needed.    . blood glucose meter kit and supplies KIT Dispense based on patient and insurance preference. Use to check glucose 4 times daily. 1 each 0  . esomeprazole (NEXIUM) 40 MG capsule Take 1 capsule (40 mg total) by mouth daily. 90 capsule 3  . furosemide (LASIX) 20 MG tablet TAKE 1  TABLET BY MOUTH EVERY MORNING AS NEEDED FOR SWELLING. PLEASE MAKE APPT 90 tablet 1  . glucose blood (FREESTYLE TEST STRIPS) test strip Use to check glucose 4 times daily. 200 each 4  . insulin degludec (TRESIBA FLEXTOUCH) 100 UNIT/ML FlexTouch Pen 42 Units SQ qAM and 42 U SQ qPM 15 mL 6  . Insulin Pen Needle (PEN NEEDLES 31GX5/16") 31G X 8 MM MISC Use as directed twice daily for medication injection 100 each 1  . lisinopril (ZESTRIL) 20 MG tablet Take 1 tablet (20 mg total) by mouth daily. 30 tablet 11  . methocarbamol (ROBAXIN) 500 MG tablet Take 500 mg by mouth 3 (three) times daily as needed.    . metoprolol tartrate (LOPRESSOR) 50 MG tablet Take 1 tablet (50 mg total) by mouth 2 (two) times daily. 60  tablet 4  . morphine (MS CONTIN) 30 MG 12 hr tablet Take by mouth.    . Multiple Vitamin (MULTIVITAMIN) tablet Take 1 tablet by mouth daily.    Marland Kitchen venlafaxine XR (EFFEXOR-XR) 150 MG 24 hr capsule Take 1 capsule by mouth 2 (two) times daily.  3  . phentermine 37.5 MG capsule Take 1 capsule (37.5 mg total) by mouth every morning. 30 capsule 0  . nitroGLYCERIN (NITROSTAT) 0.4 MG SL tablet Place 1 tablet (0.4 mg total) under the tongue every 5 (five) minutes as needed. (Patient not taking: No sig reported) 30 tablet 1   No facility-administered medications prior to visit.    Allergies  Allergen Reactions  . Ace Inhibitors     Kidney failure  . Atorvastatin Other (See Comments)    Arm pain  . Prednisone     REACTION: Anxiety  . Septra [Sulfamethoxazole-Trimethoprim]   . Metformin And Related Diarrhea    ROS As per HPI  PE: Vitals with BMI 06/12/2020 04/24/2020 04/03/2020  Height $Remov'6\' 0"'dDiYHJ$  $Remove'6\' 0"'jbpniGt$  $RemoveB'6\' 0"'OIDTpSli$   Weight 303 lbs 301 lbs 10 oz 297 lbs 10 oz  BMI 41.09 63.3 35.45  Systolic 625 638 937  Diastolic 78 84 83  Pulse 80 75 105     Gen: Alert, well appearing.  Patient is oriented to person, place, time, and situation. AFFECT: pleasant, lucid thought and speech. CV: RRR, no m/r/g.   LUNGS: CTA bilat, nonlabored resps, good aeration in all lung fields. EXT: no clubbing or cyanosis.  no edema.   LABS:  Lab Results  Component Value Date   TSH 1.68 10/18/2019   Lab Results  Component Value Date   WBC 14.7 (H) 06/07/2017   HGB 15.6 06/07/2017   HCT 47.3 06/07/2017   MCV 84.6 06/07/2017   PLT 444.0 (H) 06/07/2017   Lab Results  Component Value Date   CREATININE 0.69 03/13/2020   BUN 16 03/13/2020   NA 136 03/13/2020   K 4.3 03/13/2020   CL 100 03/13/2020   CO2 28 03/13/2020   Lab Results  Component Value Date   ALT 13 10/18/2019   AST 13 10/18/2019   ALKPHOS 63 07/12/2018   BILITOT 0.6 10/18/2019   Lab Results  Component Value Date   CHOL 163 10/18/2019   Lab  Results  Component Value Date   HDL 51 10/18/2019   Lab Results  Component Value Date   LDLCALC 95 10/18/2019   Lab Results  Component Value Date   TRIG 83 10/18/2019   Lab Results  Component Value Date   CHOLHDL 3.2 10/18/2019   Lab Results  Component Value Date   PSA 0.2 10/18/2019  PSA 0.27 06/07/2017   Lab Results  Component Value Date   HGBA1C 10.4 (H) 03/13/2020   IMPRESSION AND PLAN:  1) DM 2, hx of poor control, +DPN. He basically refuses to increase his insulin any further,says he will reluctantly stay on tresiba as-is (divided doses --46qAM and 44 qPM).  He attributes any wt gain he has had to taking insulin despite my arguments to the contrary.  He is open to trial of oral add-on med for DM (except metformin b/c GI intol).   Hba1c and bmet today.  2) HTN: control is fair.   Unable to get him to start home bp monitoring. No change for now.  Cont lisinopril $RemoveBeforeD'20mg'EoFYVWMTCdULKO$  qd and lopressor $RemoveBefor'50mg'BjnniLLDToQw$  bid. BMET today.  3) Morbid obesity: does fair with calorie restriction but is unable to exercise d/t chronic and debilitating LBP.   Stop phentermine since no help.  Spent 25 min with pt today reviewing HPI, reviewing relevant past history, doing exam, reviewing and discussing lab and imaging data, and formulating plans.  An After Visit Summary was printed and given to the patient.  FOLLOW UP: Return in about 3 months (around 09/12/2020) for annual CPE (fasting) +RCI. "overdue" for cpe  Signed:  Crissie Sickles, MD           06/12/2020

## 2020-06-13 DIAGNOSIS — I10 Essential (primary) hypertension: Secondary | ICD-10-CM | POA: Diagnosis not present

## 2020-06-13 DIAGNOSIS — E118 Type 2 diabetes mellitus with unspecified complications: Secondary | ICD-10-CM | POA: Diagnosis not present

## 2020-06-13 DIAGNOSIS — E1165 Type 2 diabetes mellitus with hyperglycemia: Secondary | ICD-10-CM | POA: Diagnosis not present

## 2020-06-13 NOTE — Addendum Note (Signed)
Addended by: Octaviano Glow on: 06/13/2020 03:34 PM   Modules accepted: Orders

## 2020-06-14 LAB — BASIC METABOLIC PANEL
BUN: 14 mg/dL (ref 7–25)
CO2: 29 mmol/L (ref 20–32)
Calcium: 9 mg/dL (ref 8.6–10.3)
Chloride: 96 mmol/L — ABNORMAL LOW (ref 98–110)
Creat: 0.86 mg/dL (ref 0.70–1.33)
Glucose, Bld: 237 mg/dL — ABNORMAL HIGH (ref 65–99)
Potassium: 4.2 mmol/L (ref 3.5–5.3)
Sodium: 137 mmol/L (ref 135–146)

## 2020-06-14 LAB — HEMOGLOBIN A1C
Hgb A1c MFr Bld: 7.7 % of total Hgb — ABNORMAL HIGH (ref <5.7)
Mean Plasma Glucose: 174 mg/dL
eAG (mmol/L): 9.7 mmol/L

## 2020-06-18 DIAGNOSIS — E1142 Type 2 diabetes mellitus with diabetic polyneuropathy: Secondary | ICD-10-CM | POA: Diagnosis not present

## 2020-06-18 DIAGNOSIS — G894 Chronic pain syndrome: Secondary | ICD-10-CM | POA: Diagnosis not present

## 2020-06-18 DIAGNOSIS — Z79891 Long term (current) use of opiate analgesic: Secondary | ICD-10-CM | POA: Diagnosis not present

## 2020-06-18 DIAGNOSIS — M961 Postlaminectomy syndrome, not elsewhere classified: Secondary | ICD-10-CM | POA: Diagnosis not present

## 2020-06-25 ENCOUNTER — Other Ambulatory Visit: Payer: Self-pay | Admitting: Anesthesiology

## 2020-06-25 DIAGNOSIS — M545 Low back pain, unspecified: Secondary | ICD-10-CM

## 2020-07-01 ENCOUNTER — Other Ambulatory Visit: Payer: Self-pay | Admitting: Family Medicine

## 2020-07-26 ENCOUNTER — Ambulatory Visit
Admission: RE | Admit: 2020-07-26 | Discharge: 2020-07-26 | Disposition: A | Discharge status: Home / Self Care | Payer: HMO | Source: Ambulatory Visit | Attending: Anesthesiology | Admitting: Anesthesiology

## 2020-07-26 DIAGNOSIS — M5126 Other intervertebral disc displacement, lumbar region: Secondary | ICD-10-CM | POA: Diagnosis not present

## 2020-07-26 DIAGNOSIS — M545 Low back pain, unspecified: Secondary | ICD-10-CM

## 2020-07-26 DIAGNOSIS — Z981 Arthrodesis status: Secondary | ICD-10-CM | POA: Diagnosis not present

## 2020-07-26 DIAGNOSIS — R200 Anesthesia of skin: Secondary | ICD-10-CM | POA: Diagnosis not present

## 2020-07-26 DIAGNOSIS — M2578 Osteophyte, vertebrae: Secondary | ICD-10-CM | POA: Diagnosis not present

## 2020-07-26 MED ORDER — GADOBENATE DIMEGLUMINE 529 MG/ML IV SOLN
20.0000 mL | Freq: Once | INTRAVENOUS | Status: AC | PRN
  Administered 2020-07-26: 20 mL via INTRAVENOUS

## 2020-07-31 ENCOUNTER — Other Ambulatory Visit: Payer: Self-pay | Admitting: Family Medicine

## 2020-08-04 ENCOUNTER — Telehealth: Payer: Self-pay

## 2020-08-04 NOTE — Telephone Encounter (Signed)
LM for pt to return call regarding blood glucose testing supplies. Received form from [employer] and needing to confirm with pt if he is authorizing this.

## 2020-08-06 NOTE — Telephone Encounter (Signed)
Unable to leave message, phone continued to ring.

## 2020-08-07 NOTE — Telephone Encounter (Signed)
Spoke with pt and does not recognize this company. Please disregard any other forms received by them.

## 2020-08-13 DIAGNOSIS — Z79891 Long term (current) use of opiate analgesic: Secondary | ICD-10-CM | POA: Diagnosis not present

## 2020-08-13 DIAGNOSIS — G894 Chronic pain syndrome: Secondary | ICD-10-CM | POA: Diagnosis not present

## 2020-08-13 DIAGNOSIS — E1142 Type 2 diabetes mellitus with diabetic polyneuropathy: Secondary | ICD-10-CM | POA: Diagnosis not present

## 2020-08-13 DIAGNOSIS — M961 Postlaminectomy syndrome, not elsewhere classified: Secondary | ICD-10-CM | POA: Diagnosis not present

## 2020-08-20 ENCOUNTER — Other Ambulatory Visit: Payer: Self-pay | Admitting: Family Medicine

## 2020-09-10 DIAGNOSIS — Z789 Other specified health status: Secondary | ICD-10-CM

## 2020-09-10 NOTE — Progress Notes (Signed)
[facility])                                            [facility] Team                                        Statin Quality Measure Assessment    09/10/2020  [facility] DAUGHTRIDGE 1965/10/31 [id number]  Per review of chart and payor information, this patient has been flagged for non-adherence to the following CMS Quality Measure:   [x]  Statin Use in Persons with Diabetes  []  Statin Use in Persons with Cardiovascular Disease  The 10-year ASCVD risk score Mikey Bussing DC Jr., et al., 2013) is: 20.9%   Values used to calculate the score:     Age: 7 years     Sex: Male     Is Non-Hispanic African American: No     Diabetic: Yes     Tobacco smoker: Yes     Systolic Blood Pressure: 440 mmHg     Is BP treated: Yes     HDL Cholesterol: 51 mg/dL     Total Cholesterol: 163 mg/dL  10/18/2019  Currently prescribed statin:  []  Yes [x]  No     Comments: N/A  History of statin use:            [x]  Yes []  No   Comments: atorvastatin 40 mg QD - per allergy, this medication caused arm pain.    Please consider ONE of the following recommendations highlighted in blue:   Initiate high intensity statin Atorvastatin 40mg  once daily, #90, 3 refills   Rosuvastatin 20mg  once daily, #90, 3 refills    Initiate moderate intensity          statin with reduced frequency if prior          statin intolerance 1x weekly, #13, 3 refills   2x weekly, #26, 3 refills   3x weekly, #39, 3 refills    Code for past statin intolerance or other exclusions (required annually)  Drug Induced Myopathy G72.0   Myositis, unspecified M60.9   Rhabdomyolysis M62.82   Prediabetes R73.03   Adverse effect of antihyperlipidemic and antiarteriosclerotic drugs, initial encounter H47.4Q5Z    Thank you for your time,  Kristeen Miss, [place] Cell: [phone]

## 2020-09-12 ENCOUNTER — Ambulatory Visit (INDEPENDENT_AMBULATORY_CARE_PROVIDER_SITE_OTHER): Payer: HMO | Admitting: Family Medicine

## 2020-09-12 ENCOUNTER — Other Ambulatory Visit: Payer: Self-pay

## 2020-09-12 ENCOUNTER — Encounter: Payer: Self-pay | Admitting: Family Medicine

## 2020-09-12 VITALS — BP 151/81 | HR 108 | Temp 97.8°F | Resp 16 | Ht 73.5 in | Wt 322.4 lb

## 2020-09-12 DIAGNOSIS — E038 Other specified hypothyroidism: Secondary | ICD-10-CM

## 2020-09-12 DIAGNOSIS — Z1211 Encounter for screening for malignant neoplasm of colon: Secondary | ICD-10-CM | POA: Diagnosis not present

## 2020-09-12 DIAGNOSIS — R140 Abdominal distension (gaseous): Secondary | ICD-10-CM

## 2020-09-12 DIAGNOSIS — Z0000 Encounter for general adult medical examination without abnormal findings: Secondary | ICD-10-CM | POA: Diagnosis not present

## 2020-09-12 DIAGNOSIS — Z125 Encounter for screening for malignant neoplasm of prostate: Secondary | ICD-10-CM | POA: Diagnosis not present

## 2020-09-12 DIAGNOSIS — I10 Essential (primary) hypertension: Secondary | ICD-10-CM

## 2020-09-12 DIAGNOSIS — E7800 Pure hypercholesterolemia, unspecified: Secondary | ICD-10-CM | POA: Diagnosis not present

## 2020-09-12 DIAGNOSIS — Z23 Encounter for immunization: Secondary | ICD-10-CM

## 2020-09-12 DIAGNOSIS — Z794 Long term (current) use of insulin: Secondary | ICD-10-CM

## 2020-09-12 DIAGNOSIS — K921 Melena: Secondary | ICD-10-CM

## 2020-09-12 DIAGNOSIS — E1140 Type 2 diabetes mellitus with diabetic neuropathy, unspecified: Secondary | ICD-10-CM | POA: Diagnosis not present

## 2020-09-12 LAB — CBC WITH DIFFERENTIAL/PLATELET
Basophils Absolute: 0.1 10*3/uL (ref 0.0–0.1)
Basophils Relative: 0.7 % (ref 0.0–3.0)
Eosinophils Absolute: 0.3 10*3/uL (ref 0.0–0.7)
Eosinophils Relative: 2.4 % (ref 0.0–5.0)
HCT: 46.8 % (ref 39.0–52.0)
Hemoglobin: 15.4 g/dL (ref 13.0–17.0)
Lymphocytes Relative: 18.5 % (ref 12.0–46.0)
Lymphs Abs: 2.4 10*3/uL (ref 0.7–4.0)
MCHC: 33 g/dL (ref 30.0–36.0)
MCV: 84.6 fl (ref 78.0–100.0)
Monocytes Absolute: 1.2 10*3/uL — ABNORMAL HIGH (ref 0.1–1.0)
Monocytes Relative: 9.6 % (ref 3.0–12.0)
Neutro Abs: 8.9 10*3/uL — ABNORMAL HIGH (ref 1.4–7.7)
Neutrophils Relative %: 68.8 % (ref 43.0–77.0)
Platelets: 348 10*3/uL (ref 150.0–400.0)
RBC: 5.53 Mil/uL (ref 4.22–5.81)
RDW: 12.4 % (ref 11.5–15.5)
WBC: 12.9 10*3/uL — ABNORMAL HIGH (ref 4.0–10.5)

## 2020-09-12 LAB — LIPID PANEL
Cholesterol: 183 mg/dL (ref 0–200)
HDL: 47.6 mg/dL (ref >39.00)
LDL Cholesterol: 113 mg/dL — ABNORMAL HIGH (ref 0–99)
NonHDL: 135.84
Total CHOL/HDL Ratio: 4
Triglycerides: 114 mg/dL (ref 0.0–149.0)
VLDL: 22.8 mg/dL (ref 0.0–40.0)

## 2020-09-12 LAB — TSH: TSH: 1.84 u[IU]/mL (ref 0.35–4.50)

## 2020-09-12 LAB — COMPREHENSIVE METABOLIC PANEL
ALT: 14 U/L (ref 0–53)
AST: 12 U/L (ref 0–37)
Albumin: 3.8 g/dL (ref 3.5–5.2)
Alkaline Phosphatase: 89 U/L (ref 39–117)
BUN: 21 mg/dL (ref 6–23)
CO2: 31 mEq/L (ref 19–32)
Calcium: 9.3 mg/dL (ref 8.4–10.5)
Chloride: 99 mEq/L (ref 96–112)
Creatinine, Ser: 0.86 mg/dL (ref 0.40–1.50)
GFR: 98.26 mL/min (ref >60.00)
Glucose, Bld: 199 mg/dL — ABNORMAL HIGH (ref 70–99)
Potassium: 4.2 mEq/L (ref 3.5–5.1)
Sodium: 139 mEq/L (ref 135–145)
Total Bilirubin: 0.6 mg/dL (ref 0.2–1.2)
Total Protein: 7.1 g/dL (ref 6.0–8.3)

## 2020-09-12 LAB — MICROALBUMIN / CREATININE URINE RATIO
Creatinine,U: 192.3 mg/dL
Microalb Creat Ratio: 16.6 mg/g (ref 0.0–30.0)
Microalb, Ur: 31.9 mg/dL — ABNORMAL HIGH (ref 0.0–1.9)

## 2020-09-12 LAB — HEMOGLOBIN A1C: Hgb A1c MFr Bld: 9.4 % — ABNORMAL HIGH (ref 4.6–6.5)

## 2020-09-12 LAB — PSA: PSA: 0.15 ng/mL (ref 0.10–4.00)

## 2020-09-12 MED ORDER — TRESIBA FLEXTOUCH 100 UNIT/ML ~~LOC~~ SOPN
PEN_INJECTOR | SUBCUTANEOUS | 6 refills | Status: DC
Start: 2020-09-12 — End: 2020-09-18

## 2020-09-12 NOTE — Progress Notes (Signed)
Office Note 09/12/2020  CC:  Chief Complaint  Patient presents with   Annual Exam    Pt is fasting    HPI:  Dylan Owen is a 55 y.o. White male who is here for annual health maintenance exam and 3 mo f/u DM and HTN. A/P as of last visit: "1) DM 2, hx of poor control, +DPN. He basically refuses to increase his insulin any further,says he will reluctantly stay on tresiba as-is (divided doses --46qAM and 44 qPM).  He attributes any wt gain he has had to taking insulin despite my arguments to the contrary.  He is open to trial of oral add-on med for DM (except metformin b/c GI intol).   Hba1c and bmet today.   2) HTN: control is fair.   Unable to get him to start home bp monitoring. No change for now.  Cont lisinopril $RemoveBeforeD'20mg'yxviSMdJvYZlJh$  qd and lopressor $RemoveBefor'50mg'vuifPsvPlrpC$  bid. BMET today.   3) Morbid obesity: does fair with calorie restriction but is unable to exercise d/t chronic and debilitating LBP.   Stop phentermine since no help."  INTERIM HX: Feeling pretty well other than chronic back and legs pain.  He is fasting since yesterday and still took his insulin this morning.  Says he doesn't feel like sugar is low. Home sugars "pretty good" but he's quite vague when asked specifics. He increased tresiba to 50 qAM and 46 qPM about 2 mo ago b/c he was seeing some higher sugars. Has gained 20 lb since last visit 3 mo ago.  Says he gains wt whether he diets or not.  HTN: no home bp monitoring.  He hasn't taken his bp med today.  He took $Remov'40mg'LyVgvO$  lasix for 2-3 d b/c ankles swollen earlier this week.  Feeling signif bloated lately, about 2 wks ago he had about a week of black/tarry stool and then in the last week it has reverted back to light brown. No nausea or abd pain.  No NSAIDs.  Past Medical History:  Diagnosis Date   B12 deficiency    Chronic low back pain    Dr. Hardin Negus is pain mgmt MD   Diabetes mellitus with complication ([place])    DPN + microalbuminuria   GERD (gastroesophageal reflux  disease)    Hypercholesterolemia    Only mild elevation but statin indicated due to comorbid DM.  Atorva -->intol (myalgias).  Will start new statin after getting glucoses better controlled (2020, 10/2019))   Hypertension    OSA (obstructive sleep apnea)    on nighttime home O2 (refuses to wear CPAP because of claustrophobia)   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 + chronic lumbar radiculopathy   Persistent asthma    Never smoker   Stable angina ([place])    nl myoview 12/07  followed by Dr Johnsie Cancel   Subclinical hypothyroidism 06/2017    Past Surgical History:  Procedure Laterality Date   LUMBAR SPINE SURGERY     lumbar laminectomy with hardware stabilization, with ensuing arachnoiditis   [place] and 2004.   Laser surgery.    Family History  Problem Relation Age of Onset   Heart disease Other    Hypertension Other    Other Other        Emotional Illness   Cancer Paternal Grandfather        prostate    Social History   Socioeconomic History   Marital status: Married    Spouse name: Not on file   Number of  children: Not on file   Years of education: Not on file   Highest education level: Not on file  Occupational History   Not on file  Tobacco Use   Smoking status: Never   Smokeless tobacco: Current   Tobacco comments:    dip  Substance and Sexual Activity   Alcohol use: No   Drug use: No   Sexual activity: Not on file  Other Topics Concern   Not on file  Social History Narrative   Married, 4 children.   Occupation: disabled since 2000.     Prior to disability he worked for [employer].   Orig from [place].   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  No hx of drug abuse.   Chews tobacco since age 74 yrs.   Regular exercise:  No               Social Determinants of Radio broadcast assistant Strain: Not on file  Food Insecurity: Not on file  Transportation  Needs: Not on file  Physical Activity: Not on file  Stress: Not on file  Social Connections: Not on file  Intimate Partner Violence: Not on file    Outpatient Medications Prior to Visit  Medication Sig Dispense Refill   Acetaminophen (TYLENOL PO) Take by mouth as needed.     blood glucose meter kit and supplies KIT Dispense based on patient and insurance preference. Use to check glucose 4 times daily. 1 each 0   esomeprazole (NEXIUM) 40 MG capsule Take 1 capsule (40 mg total) by mouth daily. 90 capsule 3   furosemide (LASIX) 20 MG tablet TAKE 1 TABLET BY MOUTH EVERY MORNING AS NEEDED FOR SWELLING. PLEASE MAKE APPT 90 tablet 1   glucose blood (FREESTYLE TEST STRIPS) test strip Use to check glucose 4 times daily. 200 each 4   Insulin Pen Needle (PEN NEEDLES 31GX5/16") 31G X 8 MM MISC Use as directed twice daily for medication injection 100 each 1   lisinopril (ZESTRIL) 20 MG tablet Take 1 tablet (20 mg total) by mouth daily. 30 tablet 11   methocarbamol (ROBAXIN) 500 MG tablet Take 500 mg by mouth 3 (three) times daily as needed.     metoprolol tartrate (LOPRESSOR) 50 MG tablet TAKE 1 TABLET BY MOUTH TWICE A DAY 180 tablet 0   morphine (MS CONTIN) 30 MG 12 hr tablet Take by mouth.     Multiple Vitamin (MULTIVITAMIN) tablet Take 1 tablet by mouth daily.     venlafaxine XR (EFFEXOR-XR) 150 MG 24 hr capsule Take 1 capsule by mouth 2 (two) times daily.  3   insulin degludec (TRESIBA FLEXTOUCH) 100 UNIT/ML FlexTouch Pen INJECT 70 UNITS UNDER THE SKIN ONCE DAILY 15 mL 0   nitroGLYCERIN (NITROSTAT) 0.4 MG SL tablet Place 1 tablet (0.4 mg total) under the tongue every 5 (five) minutes as needed. (Patient not taking: Reported on 09/12/2020) 30 tablet 1   No facility-administered medications prior to visit.    Allergies  Allergen Reactions   Ace Inhibitors     Kidney failure   Atorvastatin Other (See Comments)    Arm pain   Prednisone     REACTION: Anxiety   Septra  [Sulfamethoxazole-Trimethoprim]    Metformin And Related Diarrhea    ROS Review of Systems  Constitutional:  Negative for appetite change, chills, fatigue and fever.  HENT:  Negative for congestion, dental problem, ear pain  and sore throat.   Eyes:  Negative for discharge, redness and visual disturbance.  Respiratory:  Negative for cough, chest tightness, shortness of breath and wheezing.   Cardiovascular:  Negative for chest pain, palpitations and leg swelling.  Gastrointestinal:  Negative for abdominal pain, blood in stool, diarrhea, nausea and vomiting.  Genitourinary:  Negative for difficulty urinating, dysuria, flank pain, frequency, hematuria and urgency.  Musculoskeletal:  Negative for arthralgias, back pain, joint swelling, myalgias and neck stiffness.  Skin:  Negative for pallor and rash.  Neurological:  Negative for dizziness, speech difficulty, weakness and headaches.  Hematological:  Negative for adenopathy. Does not bruise/bleed easily.  Psychiatric/Behavioral:  Negative for confusion and sleep disturbance. The patient is not nervous/anxious.    PE; Vitals with BMI 09/12/2020 06/12/2020 04/24/2020  Height 6' 1.5" $Remov'6\' 0"'ipEBSg$  $Remove'6\' 0"'KQnSEBq$   Weight 322 lbs 6 oz 303 lbs 301 lbs 10 oz  BMI 41.95 72.09 47.0  Systolic 962 836 629  Diastolic 81 78 84  Pulse 476 80 75   Gen: Alert, well appearing.  Patient is oriented to person, place, time, and situation. AFFECT: pleasant, lucid thought and speech. ENT: Ears: EACs clear, normal epithelium.  TMs with good light reflex and landmarks bilaterally.  Eyes: no injection, icteris, swelling, or exudate.  EOMI, PERRLA. Nose: no drainage or turbinate edema/swelling.  No injection or focal lesion.  Mouth: lips without lesion/swelling.  Oral mucosa pink and moist.  Dentition intact and without obvious caries or gingival swelling.  Oropharynx without erythema, exudate, or swelling.  Neck: supple/nontender.  No LAD, mass, or TM.  Carotid pulses 2+ bilaterally,  without bruits. CV: RRR, no m/r/g.   LUNGS: CTA bilat, nonlabored resps, good aeration in all lung fields. ABD: soft, NT, ND, BS normal.  No hepatospenomegaly or mass.  No bruits. EXT: no clubbing, cyanosis, or edema.  PT pulses 2+ bilat.  Trace DP pulses bilat. Musculoskeletal: no joint swelling, erythema, warmth, or tenderness.  ROM of all joints intact. Skin - no sores or suspicious lesions or rashes or color changes  Pertinent labs:  Lab Results  Component Value Date   TSH 1.68 10/18/2019   Lab Results  Component Value Date   WBC 14.7 (H) 06/07/2017   HGB 15.6 06/07/2017   HCT 47.3 06/07/2017   MCV 84.6 06/07/2017   PLT 444.0 (H) 06/07/2017   Lab Results  Component Value Date   VITAMINB12 444 10/18/2019    Lab Results  Component Value Date   CREATININE 0.86 06/13/2020   BUN 14 06/13/2020   NA 137 06/13/2020   K 4.2 06/13/2020   CL 96 (L) 06/13/2020   CO2 29 06/13/2020   Lab Results  Component Value Date   ALT 13 10/18/2019   AST 13 10/18/2019   ALKPHOS 63 07/12/2018   BILITOT 0.6 10/18/2019   Lab Results  Component Value Date   CHOL 163 10/18/2019   Lab Results  Component Value Date   HDL 51 10/18/2019   Lab Results  Component Value Date   LDLCALC 95 10/18/2019   Lab Results  Component Value Date   TRIG 83 10/18/2019   Lab Results  Component Value Date   CHOLHDL 3.2 10/18/2019   Lab Results  Component Value Date   PSA 0.2 10/18/2019   PSA 0.27 06/07/2017   Lab Results  Component Value Date   HGBA1C 7.7 (H) 06/13/2020   ASSESSMENT AND PLAN:   1) DM 2; fair control lately. Hba1c and urine microalb/cr today. Cont tresiba  at 50 U qAM and 46 U PM for now.  2) HTN: unclear control. Hasn't taken med today. Encouraged home monitor b/c he has access to a cuff. Lytes/cr today. No med change at this time.  3) Bloating, relatively recent period of black/tarry stools. No NSAIDs. CBC today. Suspect he has some diabetic gastroparesis.  As  long as eating ok, no n/v or abd pain then no further w/u at this time.  4) Health maintenance exam: Reviewed age and gender appropriate health maintenance issues (prudent diet, regular exercise, health risks of tobacco and excessive alcohol, use of seatbelts, fire alarms in home, use of sunscreen).  Also reviewed age and gender appropriate health screening as well as vaccine recommendations. Vaccines: Prevnar 20 recommended->given today.  Shingrix->#1 given today. Labs: HP labs + Hba1c, urine microalb/cr, and PSA. Prostate ca screening: PSA today. Colon ca screening: overdue for initial screening-->he declines any screening.   An After Visit Summary was printed and given to the patient.  FOLLOW UP:  Return in about 3 months (around 12/13/2020) for routine chronic illness f/u.  Signed:  Crissie Sickles, MD           09/12/2020

## 2020-09-12 NOTE — Addendum Note (Signed)
Addended by: Deveron Furlong D on: 09/12/2020 12:18 PM   Modules accepted: Orders

## 2020-09-14 ENCOUNTER — Other Ambulatory Visit: Payer: Self-pay | Admitting: Family Medicine

## 2020-09-18 ENCOUNTER — Other Ambulatory Visit: Payer: Self-pay | Admitting: Family Medicine

## 2020-09-18 ENCOUNTER — Telehealth: Payer: Self-pay | Admitting: Family Medicine

## 2020-09-18 NOTE — Telephone Encounter (Signed)
Pt called for lab results from 09/12/20, advised pt that we will call once resulted.   Ph # [phone]

## 2020-09-18 NOTE — Telephone Encounter (Signed)
Please review labs and advise of any recommendations.

## 2020-09-19 NOTE — Telephone Encounter (Signed)
Result note sent to Team Caydn Justen 09/18/20.

## 2020-09-22 ENCOUNTER — Encounter: Payer: Self-pay | Admitting: Family Medicine

## 2020-10-08 DIAGNOSIS — E1142 Type 2 diabetes mellitus with diabetic polyneuropathy: Secondary | ICD-10-CM | POA: Diagnosis not present

## 2020-10-08 DIAGNOSIS — M961 Postlaminectomy syndrome, not elsewhere classified: Secondary | ICD-10-CM | POA: Diagnosis not present

## 2020-10-08 DIAGNOSIS — G894 Chronic pain syndrome: Secondary | ICD-10-CM | POA: Diagnosis not present

## 2020-10-08 DIAGNOSIS — Z79891 Long term (current) use of opiate analgesic: Secondary | ICD-10-CM | POA: Diagnosis not present

## 2020-10-27 ENCOUNTER — Other Ambulatory Visit: Payer: Self-pay | Admitting: Family Medicine

## 2020-11-05 ENCOUNTER — Ambulatory Visit (INDEPENDENT_AMBULATORY_CARE_PROVIDER_SITE_OTHER): Payer: HMO | Admitting: *Deleted

## 2020-11-05 DIAGNOSIS — Z0000 Encounter for general adult medical examination without abnormal findings: Secondary | ICD-10-CM

## 2020-11-05 NOTE — Progress Notes (Addendum)
Subjective:   Dylan Owen is a 55 y.o. male who presents for Medicare Annual/Subsequent preventive examination.  I connected with  Emannuel Vise Bomkamp on 11/05/20 by a telephone enabled telemedicine application and verified that I am speaking with the correct person using two identifiers.   I discussed the limitations of evaluation and management by telemedicine. The patient expressed understanding and agreed to proceed.   Review of Systems    na Cardiac Risk Factors include: advanced age (>56mn, >>64women);diabetes mellitus;male gender;obesity (BMI >30kg/m2);hypertension;sedentary lifestyle     Objective:    Today's Vitals   11/05/20 1118  PainSc: 5    There is no height or weight on file to calculate BMI.  Advanced Directives 11/05/2020 02/14/2020 03/31/2017 03/09/2017  Does Patient Have a Medical Advance Directive? No No No No  Would patient like information on creating a medical advance directive? No - Patient declined - No - Patient declined -    Current Medications (verified) Outpatient Encounter Medications as of 11/05/2020  Medication Sig   Acetaminophen (TYLENOL PO) Take by mouth as needed.   blood glucose meter kit and supplies KIT Dispense based on patient and insurance preference. Use to check glucose 4 times daily.   esomeprazole (NEXIUM) 40 MG capsule Take 1 capsule (40 mg total) by mouth daily.   furosemide (LASIX) 20 MG tablet TAKE 1 TABLET BY MOUTH EVERY MORNING AS NEEDED FOR SWELLING. PLEASE MAKE APPT   glucose blood (FREESTYLE TEST STRIPS) test strip Use to check glucose 4 times daily.   insulin degludec (TRESIBA FLEXTOUCH) 100 UNIT/ML FlexTouch Pen INJECT 70 UNITS UNDER THE SKIN ONCE DAILY   Insulin Pen Needle (PEN NEEDLES 31GX5/16") 31G X 8 MM MISC Use as directed twice daily for medication injection   lisinopril (ZESTRIL) 20 MG tablet Take 1 tablet (20 mg total) by mouth daily.   methocarbamol (ROBAXIN) 500 MG tablet Take 500 mg by mouth 3 (three) times  daily as needed.   metoprolol tartrate (LOPRESSOR) 50 MG tablet TAKE 1 TABLET BY MOUTH TWICE A DAY   morphine (MS CONTIN) 30 MG 12 hr tablet Take by mouth.   Multiple Vitamin (MULTIVITAMIN) tablet Take 1 tablet by mouth daily.   venlafaxine XR (EFFEXOR-XR) 150 MG 24 hr capsule Take 1 capsule by mouth 2 (two) times daily.   nitroGLYCERIN (NITROSTAT) 0.4 MG SL tablet Place 1 tablet (0.4 mg total) under the tongue every 5 (five) minutes as needed. (Patient not taking: No sig reported)   No facility-administered encounter medications on file as of 11/05/2020.    Allergies (verified) Ace inhibitors, Atorvastatin, Prednisone, Septra [sulfamethoxazole-trimethoprim], and Metformin and related   History: Past Medical History:  Diagnosis Date   B12 deficiency    Chronic low back pain    Dr. PHardin Negusis pain mgmt MD   Diabetes mellitus with complication ([facility]    DPN + microalbuminuria   GERD (gastroesophageal reflux disease)    Hypercholesterolemia    Only mild elevation but statin indicated due to comorbid DM.  Atorva -->intol (myalgias).  Pt declines statin re-try as of 09/2020.   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   Laser surgery.   Family History  Problem Relation Age of Onset   Heart disease Other    Hypertension Other    Other Other        Emotional Illness   Cancer Paternal Grandfather        prostate   Social History   Socioeconomic History   Marital  status: Married    Spouse name: Not on file   Number of children: Not on file   Years of education: Not on file   Highest education level: Not on file  Occupational History   Not on file  Tobacco Use   Smoking status: Never   Smokeless tobacco: Current   Tobacco comments:    dip  Substance and Sexual Activity   Alcohol use: No   Drug use: No   Sexual activity: Yes  Other Topics Concern   Not on file  Social History Narrative   Married, 4 children.   Occupation: disabled since 2000.     Prior to disability he worked for [employer].   Orig from [place].   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  No hx of drug abuse.   Chews tobacco since age 59 yrs.   Regular exercise:  No               Social Determinants of Health   Financial Resource Strain: Low Risk    Difficulty of Paying Living Expenses: Not hard at all  Food Insecurity: No Food Insecurity   Worried About Charity fundraiser in the Last Year: Never true   Ran Out of Food in the Last Year: Never true  Transportation Needs: No Transportation Needs   Lack of Transportation (Medical): No   Lack of Transportation (Non-Medical): No  Physical Activity: Inactive   Days of Exercise per Week: 0 days   Minutes of Exercise per Session: 0 min  Stress: No Stress Concern Present   Feeling of Stress : Only a little  Social Connections: Moderately Isolated   Frequency of Communication with Friends and Family: Three times a week   Frequency of Social Gatherings with Friends and Family: Twice a week   Attends Religious Services: Never   Marine scientist or Organizations: No   Attends Music therapist: Never   Marital Status: Married    Tobacco Counseling Ready to quit: Not Answered Counseling given: Not Answered Tobacco comments: dip   Clinical Intake:  Pre-visit preparation completed: Yes  Pain : 0-10 Pain Score: 5  Pain  Location: Back Pain Descriptors / Indicators: Constant, Burning, Aching Pain Onset: More than a month ago Pain Frequency: Constant Pain Relieving Factors: morphine Effect of Pain on Daily Activities: yes  Pain Relieving Factors: morphine  Nutritional Risks: None Diabetes: Yes CBG done?: No Did pt. bring in CBG monitor from home?: No  How often do you need to have someone help you when you read instructions, pamphlets, or other written materials from your doctor or pharmacy?: 1 - Never  Diabetic?yes  Nutrition Risk Assessment:  Has the patient had any N/V/D within the last 2 months?  No  Does the patient have any non-healing wounds?  No  Has the patient had any unintentional weight loss or weight gain?  No   Diabetes:  Is the patient diabetic?  Yes  If diabetic, was a CBG obtained today?  No  Did the patient bring in their glucometer from home?  No  How often do you monitor your CBG's? 3 x times.   Financial Strains and Diabetes Management:  Are you having any financial strains with the device, your supplies or your medication? No .  Does the patient want to be seen by Chronic Care Management for management of their diabetes?  No  Would the patient like to be referred to a Nutritionist or for Diabetic Management?  No   Diabetic Exams:  Diabetic Eye Exam: Completed  . Overdue for diabetic eye exam. Pt has been advised about th  Diabetic Foot Exam: Completed . Pt has been advised about the importance in completing this exam.      Information entered by :: Leroy Kennedy LPN   Activities of Daily Living In your present state of health, do you have any difficulty performing the following activities: 11/05/2020  Hearing? N  Vision? N  Difficulty concentrating or making decisions? N  Walking or climbing stairs? Y  Dressing or bathing? N  Doing errands, shopping? N  Preparing Food and eating ? N  Using the Toilet? N  In the past six months, have you accidently leaked  urine? N  Do you have problems with loss of bowel control? N  Managing your Finances? N  Housekeeping or managing your Housekeeping? N  Some recent data might be hidden    Patient Care Team: Tammi Sou, MD as PCP - General (Family Medicine) Nicholaus Bloom, MD as Consulting Physician (Pain Medicine) Kristeen Miss, MD as Consulting Physician (Neurosurgery)  Indicate any recent Medical Services you may have received from other than [facility] providers in the past year (date may be approximate).     Assessment:   This is a routine wellness examination for [place].  Hearing/Vision screen Hearing Screening - Comments:: No trouble hearing Vision Screening - Comments:: Will schedule appointment [facility] [place]  Dietary issues and exercise activities discussed: Current Exercise Habits: The patient does not participate in regular exercise at present   Goals Addressed             This Visit's Progress    Patient Stated       Patient would like to loose weight       Depression Screen PHQ 2/9 Scores 11/05/2020 09/12/2020 06/07/2017 03/31/2017  PHQ - 2 Score _0 -  PHQ- 9 Score _1 -  Exception Documentation - - - Other- indicate reason in comment box  Not completed - - - call completed with spouse    Fall Risk Fall Risk  03/31/2017  Falls in the past year? No    FALL RISK PREVENTION PERTAINING TO THE HOME:  Any stairs in or around the home? No  If so, are there any without handrails? No  Home free of loose throw rugs in walkways, pet beds, electrical cords, etc? Yes  Adequate lighting in your home to reduce risk of falls? Yes   ASSISTIVE DEVICES UTILIZED TO PREVENT FALLS:  Life alert? No  Use of a cane, walker or w/c? Yes  Grab bars in the bathroom? No  Shower chair or bench in shower? Yes  Elevated toilet seat or a handicapped toilet? No   TIMED UP AND GO:  Was the test performed? No .  Cognitive Function:  Normal cognitive status assessed by direct  observation by this Nurse Health Advisor. No abnormalities found.          Immunizations Immunization History  Administered Date(s) Administered   Influenza Inj Mdck Quad Pf 01/02/2018   Influenza Split 02/08/2012   Influenza Whole 02/08/2008, 01/02/2009, 01/28/2010   Influenza,inj,Quad PF,6+ Mos 12/19/2013, 03/04/2016, 06/07/2017   Influenza-Unspecified 03/07/2013, 02/03/2018   PFIZER(Purple Top)SARS-COV-2 Vaccination 08/28/2019, 09/14/2019   PNEUMOCOCCAL CONJUGATE-20 09/12/2020   Pneumococcal Polysaccharide-23 09/27/2008, 03/04/2016   Tdap 12/19/2013   Zoster Recombinat (Shingrix) 09/12/2020    TDAP status: Up to date  Flu Vaccine status: Up to date  Pneumococcal vaccine status: Up to date  Covid-19 vaccine status: Information provided on how to obtain vaccines.   Qualifies for Shingles Vaccine? Yes   Zostavax completed No   Shingrix Completed?: No.    Education has been provided regarding the importance of this vaccine. Patient has been advised to call insurance company to determine out of pocket expense if they have not yet received this vaccine. Advised may also receive vaccine at local pharmacy or Health Dept. Verbalized acceptance and understanding.  Screening Tests Health Maintenance  Topic Date Due   COLONOSCOPY (Pts 45-34yrs Insurance coverage will need to be confirmed)  Never done   OPHTHALMOLOGY EXAM  07/08/2017   COVID-19 Vaccine (3 - Booster for Pfizer series) 02/14/2020   FOOT EXAM  10/17/2020   INFLUENZA VACCINE  11/03/2020   Zoster Vaccines- Shingrix (2 of 2) 11/07/2020   HEMOGLOBIN A1C  03/14/2021   TETANUS/TDAP  12/20/2023   PNEUMOCOCCAL POLYSACCHARIDE VACCINE AGE 31-64 HIGH RISK  Completed   Pneumococcal Vaccine 46-65 Years old  Aged Out   HPV VACCINES  Aged Out   Hepatitis C Screening  Discontinued   HIV Screening  Discontinued    Health Maintenance  Health Maintenance Due  Topic Date Due   COLONOSCOPY (Pts 45-69yrs Insurance coverage will  need to be confirmed)  Never done   OPHTHALMOLOGY EXAM  07/08/2017   COVID-19 Vaccine (3 - Booster for Pfizer series) 02/14/2020   FOOT EXAM  10/17/2020   INFLUENZA VACCINE  11/03/2020    Colonoscopy -    Patient Declined  Lung Cancer Screening: (Low Dose CT Chest recommended if Age 81-80 years, 30 pack-year currently smoking OR have quit w/in 15years.) does not qualify.   Lung Cancer Screening Referral:   Additional Screening:  Hepatitis C Screening: does not qualify; C  Vision Screening: Recommended annual ophthalmology exams for early detection of glaucoma and other disorders of the eye. Is the patient up to date with their annual eye exam?  Yes  Who is the provider or what is the name of the office in which the patient attends annual eye exams? [facility] in [place] If pt is not established with a provider, would they like to be referred to a provider to establish care? No .   Dental Screening: Recommended annual dental exams for proper oral hygiene  Community Resource Referral / Chronic Care Management: CRR required this visit?  No   CCM required this visit?  No      Plan:     I have personally reviewed and noted the following in the patient's chart:   Medical and social history Use of alcohol, tobacco or illicit drugs  Current medications and supplements including opioid prescriptions. Patient is currently taking opioid prescriptions. Information provided to patient regarding non-opioid alternatives. Patient advised to discuss non-opioid treatment plan with their provider. Functional ability  and status Nutritional status Physical activity Advanced directives List of other physicians Hospitalizations, surgeries, and ER visits in previous 12 months Vitals Screenings to include cognitive, depression, and falls Referrals and appointments  In addition, I have reviewed and discussed with patient certain preventive protocols, quality metrics, and best practice  recommendations. A written personalized care plan for preventive services as well as general preventive health recommendations were provided to patient.     Leroy Kennedy, LPN   10/06/4191   Nurse Notes: na  ADDENDUM 11/10/20: Reviewed and agree. Signed:  Crissie Sickles, MD           11/10/2020

## 2020-11-05 NOTE — Patient Instructions (Signed)
Mr. Dylan Owen , Thank you for taking time to come for your Medicare Wellness Visit. I appreciate your ongoing commitment to your health goals. Please review the following plan we discussed and let me know if I can assist you in the future.   Screening recommendations/referrals: Colonoscopy: Education provided Recommended yearly ophthalmology/optometry visit for glaucoma screening and checkup Recommended yearly dental visit for hygiene and checkup  Vaccinations: Influenza vaccine: up to date Pneumococcal vaccine: up to date Tdap vaccine: up to date Shingles vaccine: 1 of two Education provided    Advanced directives:  education provided  Conditions/risks identified: na  Next appointment: 12-17-2020 @ 11:30  Dr. Anitra Lauth  Preventive Care 54 Years and Older, Male Preventive care refers to lifestyle choices and visits with your health care provider that can promote health and wellness. What does preventive care include? A yearly physical exam. This is also called an annual well check. Dental exams once or twice a year. Routine eye exams. Ask your health care provider how often you should have your eyes checked. Personal lifestyle choices, including: Daily care of your teeth and gums. Regular physical activity. Eating a healthy diet. Avoiding tobacco and drug use. Limiting alcohol use. Practicing safe sex. Taking low doses of aspirin every day. Taking vitamin and mineral supplements as recommended by your health care provider. What happens during an annual well check? The services and screenings done by your health care provider during your annual well check will depend on your age, overall health, lifestyle risk factors, and family history of disease. Counseling  Your health care provider may ask you questions about your: Alcohol use. Tobacco use. Drug use. Emotional well-being. Home and relationship well-being. Sexual activity. Eating habits. History of falls. Memory and  ability to understand (cognition). Work and work Statistician. Screening  You may have the following tests or measurements: Height, weight, and BMI. Blood pressure. Lipid and cholesterol levels. These may be checked every 5 years, or more frequently if you are over 65 years old. Skin check. Lung cancer screening. You may have this screening every year starting at age 72 if you have a 30-pack-year history of smoking and currently smoke or have quit within the past 15 years. Fecal occult blood test (FOBT) of the stool. You may have this test every year starting at age 66. Flexible sigmoidoscopy or colonoscopy. You may have a sigmoidoscopy every 5 years or a colonoscopy every 10 years starting at age 65. Prostate cancer screening. Recommendations will vary depending on your family history and other risks. Hepatitis C blood test. Hepatitis B blood test. Sexually transmitted disease (STD) testing. Diabetes screening. This is done by checking your blood sugar (glucose) after you have not eaten for a while (fasting). You may have this done every 1-3 years. Abdominal aortic aneurysm (AAA) screening. You may need this if you are a current or former smoker. Osteoporosis. You may be screened starting at age 54 if you are at high risk. Talk with your health care provider about your test results, treatment options, and if necessary, the need for more tests. Vaccines  Your health care provider may recommend certain vaccines, such as: Influenza vaccine. This is recommended every year. Tetanus, diphtheria, and acellular pertussis (Tdap, Td) vaccine. You may need a Td booster every 10 years. Zoster vaccine. You may need this after age 52. Pneumococcal 13-valent conjugate (PCV13) vaccine. One dose is recommended after age 58. Pneumococcal polysaccharide (PPSV23) vaccine. One dose is recommended after age 20. Talk to your health care provider  about which screenings and vaccines you need and how often you need  them. This information is not intended to replace advice given to you by your health care provider. Make sure you discuss any questions you have with your health care provider. Document Released: 04/18/2015 Document Revised: 12/10/2015 Document Reviewed: 01/21/2015 Elsevier Interactive Patient Education  2017 [place] Prevention in the Home Falls can cause injuries. They can happen to people of all ages. There are many things you can do to make your home safe and to help prevent falls. What can I do on the outside of my home? Regularly fix the edges of walkways and driveways and fix any cracks. Remove anything that might make you trip as you walk through a door, such as a raised step or threshold. Trim any bushes or trees on the path to your home. Use bright outdoor lighting. Clear any walking paths of anything that might make someone trip, such as rocks or tools. Regularly check to see if handrails are loose or broken. Make sure that both sides of any steps have handrails. Any raised decks and porches should have guardrails on the edges. Have any leaves, snow, or ice cleared regularly. Use sand or salt on walking paths during winter. Clean up any spills in your garage right away. This includes oil or grease spills. What can I do in the bathroom? Use night lights. Install grab bars by the toilet and in the tub and shower. Do not use towel bars as grab bars. Use non-skid mats or decals in the tub or shower. If you need to sit down in the shower, use a plastic, non-slip stool. Keep the floor dry. Clean up any water that spills on the floor as soon as it happens. Remove soap buildup in the tub or shower regularly. Attach bath mats securely with double-sided non-slip rug tape. Do not have throw rugs and other things on the floor that can make you trip. What can I do in the bedroom? Use night lights. Make sure that you have a light by your bed that is easy to reach. Do not use  any sheets or blankets that are too big for your bed. They should not hang down onto the floor. Have a firm chair that has side arms. You can use this for support while you get dressed. Do not have throw rugs and other things on the floor that can make you trip. What can I do in the kitchen? Clean up any spills right away. Avoid walking on wet floors. Keep items that you use a lot in easy-to-reach places. If you need to reach something above you, use a strong step stool that has a grab bar. Keep electrical cords out of the way. Do not use floor polish or wax that makes floors slippery. If you must use wax, use non-skid floor wax. Do not have throw rugs and other things on the floor that can make you trip. What can I do with my stairs? Do not leave any items on the stairs. Make sure that there are handrails on both sides of the stairs and use them. Fix handrails that are broken or loose. Make sure that handrails are as long as the stairways. Check any carpeting to make sure that it is firmly attached to the stairs. Fix any carpet that is loose or worn. Avoid having throw rugs at the top or bottom of the stairs. If you do have throw rugs, attach them to the floor  with carpet tape. Make sure that you have a light switch at the top of the stairs and the bottom of the stairs. If you do not have them, ask someone to add them for you. What else can I do to help prevent falls? Wear shoes that: Do not have high heels. Have rubber bottoms. Are comfortable and fit you well. Are closed at the toe. Do not wear sandals. If you use a stepladder: Make sure that it is fully opened. Do not climb a closed stepladder. Make sure that both sides of the stepladder are locked into place. Ask someone to hold it for you, if possible. Clearly mark and make sure that you can see: Any grab bars or handrails. First and last steps. Where the edge of each step is. Use tools that help you move around (mobility aids)  if they are needed. These include: Canes. Walkers. Scooters. Crutches. Turn on the lights when you go into a dark area. Replace any light bulbs as soon as they burn out. Set up your furniture so you have a clear path. Avoid moving your furniture around. If any of your floors are uneven, fix them. If there are any pets around you, be aware of where they are. Review your medicines with your doctor. Some medicines can make you feel dizzy. This can increase your chance of falling. Ask your doctor what other things that you can do to help prevent falls. This information is not intended to replace advice given to you by your health care provider. Make sure you discuss any questions you have with your health care provider. Document Released: 01/16/2009 Document Revised: 08/28/2015 Document Reviewed: 04/26/2014 Elsevier Interactive Patient Education  2017 [employer].

## 2020-11-15 ENCOUNTER — Other Ambulatory Visit: Payer: Self-pay | Admitting: Family Medicine

## 2020-11-21 ENCOUNTER — Telehealth: Payer: Self-pay

## 2020-11-21 NOTE — Telephone Encounter (Signed)
FYI  Please see below

## 2020-11-21 NOTE — Telephone Encounter (Signed)
FYI  Chastity calling from [employer] at [phone].  Patient has declined care management.

## 2020-12-03 DIAGNOSIS — Z79891 Long term (current) use of opiate analgesic: Secondary | ICD-10-CM | POA: Diagnosis not present

## 2020-12-03 DIAGNOSIS — G894 Chronic pain syndrome: Secondary | ICD-10-CM | POA: Diagnosis not present

## 2020-12-03 DIAGNOSIS — M961 Postlaminectomy syndrome, not elsewhere classified: Secondary | ICD-10-CM | POA: Diagnosis not present

## 2020-12-03 DIAGNOSIS — E1142 Type 2 diabetes mellitus with diabetic polyneuropathy: Secondary | ICD-10-CM | POA: Diagnosis not present

## 2020-12-15 DIAGNOSIS — Z789 Other specified health status: Secondary | ICD-10-CM

## 2020-12-15 NOTE — Progress Notes (Signed)
[facility])                                            [facility] Team                                        Statin Quality Measure Assessment    12/15/2020  Dylan Owen [birth year] [id number]  Per review of chart and payor information, this patient has been flagged for non-adherence to the following CMS Quality Measure:   '[x]'$  Statin Use in Persons with Diabetes  '[x]'$  Statin Use in Persons with Cardiovascular Disease  The ASCVD Risk score, using ACC estimator manually, to calculate: 13.9% - He is not currently taking a cholesterol lowering medication   Currently prescribed statin:  '[]'$  Yes '[x]'$  No     Comments: N/A  History of statin use:            '[x]'$  Yes '[]'$  No   Comments: Patient was previously on atorvastatin but it caused arm pain and myalgia. If clinically appropriate, please consider re-challenging statin, alternative statin dosing, or associating exclusion code (see below) at the next office visit on 12/17/2020.  Please consider ONE of the following recommendations:   Initiate high intensity statin Atorvastatin '40mg'$  once daily, #90, 3 refills   Rosuvastatin '20mg'$  once daily, #90, 3 refills    Initiate moderate intensity          statin with reduced frequency if prior          statin intolerance 1x weekly, #13, 3 refills   2x weekly, #26, 3 refills   3x weekly, #39, 3 refills    Code for past statin intolerance or other exclusions (required annually)  Drug Induced Myopathy G72.0   Myositis, unspecified M60.9   Rhabdomyolysis M62.82   Prediabetes R73.03   Adverse effect of antihyperlipidemic and antiarteriosclerotic drugs, initial encounter [id number]    Thank you for your time,  Kristeen Miss, [place] Cell: [phone]

## 2020-12-17 ENCOUNTER — Ambulatory Visit: Payer: HMO | Admitting: Family Medicine

## 2020-12-25 ENCOUNTER — Encounter: Payer: Self-pay | Admitting: Family Medicine

## 2020-12-25 ENCOUNTER — Ambulatory Visit (INDEPENDENT_AMBULATORY_CARE_PROVIDER_SITE_OTHER): Payer: HMO | Admitting: Family Medicine

## 2020-12-25 ENCOUNTER — Other Ambulatory Visit: Payer: Self-pay

## 2020-12-25 VITALS — BP 166/104 | HR 90 | Temp 98.4°F | Ht 73.5 in | Wt 325.2 lb

## 2020-12-25 DIAGNOSIS — N35919 Unspecified urethral stricture, male, unspecified site: Secondary | ICD-10-CM

## 2020-12-25 DIAGNOSIS — T380X5A Adverse effect of glucocorticoids and synthetic analogues, initial encounter: Secondary | ICD-10-CM

## 2020-12-25 DIAGNOSIS — I10 Essential (primary) hypertension: Secondary | ICD-10-CM | POA: Diagnosis not present

## 2020-12-25 DIAGNOSIS — M609 Myositis, unspecified: Secondary | ICD-10-CM | POA: Diagnosis not present

## 2020-12-25 DIAGNOSIS — T466X5A Adverse effect of antihyperlipidemic and antiarteriosclerotic drugs, initial encounter: Secondary | ICD-10-CM | POA: Diagnosis not present

## 2020-12-25 DIAGNOSIS — Z23 Encounter for immunization: Secondary | ICD-10-CM

## 2020-12-25 DIAGNOSIS — Z794 Long term (current) use of insulin: Secondary | ICD-10-CM | POA: Diagnosis not present

## 2020-12-25 DIAGNOSIS — E1140 Type 2 diabetes mellitus with diabetic neuropathy, unspecified: Secondary | ICD-10-CM | POA: Diagnosis not present

## 2020-12-25 DIAGNOSIS — E7800 Pure hypercholesterolemia, unspecified: Secondary | ICD-10-CM | POA: Diagnosis not present

## 2020-12-25 LAB — POCT GLYCOSYLATED HEMOGLOBIN (HGB A1C)
HbA1c POC (<> result, manual entry): 12.6 % (ref 4.0–5.6)
HbA1c, POC (controlled diabetic range): 12.6 % — AB (ref 0.0–7.0)
HbA1c, POC (prediabetic range): 12.6 % — AB (ref 5.7–6.4)
Hemoglobin A1C: 12.6 % — AB (ref 4.0–5.6)

## 2020-12-25 MED ORDER — TRESIBA FLEXTOUCH 100 UNIT/ML ~~LOC~~ SOPN: PEN_INJECTOR | SUBCUTANEOUS | 1 refills | Status: DC

## 2020-12-25 NOTE — Progress Notes (Signed)
OFFICE VISIT  12/25/2020  CC:  Chief Complaint  Patient presents with   Follow-up    RCI; pt is not fasting   HPI:    Patient is a 55 y.o. Caucasian male who presents accompanied by his wife Dylan Owen for 3 mo f/u DM, HTN, and HLD with hx of statin-induced myopathy. A/P as of last visit: "1) DM 2; fair control lately. Hba1c and urine microalb/cr today. Cont tresiba at 50 U qAM and 46 U PM for now.   2) HTN: unclear control. Hasn't taken med today. Encouraged home monitor b/c he has access to a cuff. Lytes/cr today. No med change at this time.   3) Bloating, relatively recent period of black/tarry stools. No NSAIDs. CBC today. Suspect he has some diabetic gastroparesis.  As long as eating ok, no n/v or abd pain then no further w/u at this time.   4) Health maintenance exam: Reviewed age and gender appropriate health maintenance issues (prudent diet, regular exercise, health risks of tobacco and excessive alcohol, use of seatbelts, fire alarms in home, use of sunscreen).  Also reviewed age and gender appropriate health screening as well as vaccine recommendations. Vaccines: Prevnar 20 recommended->given today.  Shingrix->#1 given today. Labs: HP labs + Hba1c, urine microalb/cr, and PSA. Prostate ca screening: PSA today. Colon ca screening: overdue for initial screening-->he declines any screening."  INTERIM HX: Stressing a lot, taking care of chronically ill mother.  His back pain has been even worse than usual.    Last month "not too good" mainly regarding his chronic pain, is about to start another round of PT.   Labs 3 mo ago showed a1c up to 9.4%, +mild albuminuria c/w past measurements. I wanted him to check with his insurer about coverage for a SGL2-I or GLP-agonist but he never called back with this info. Dylan Owen:  tries to take 81 U tresiba twice per day.  The last month he's only taking the insulin 50% of the time. No home gluc monitoring.  Occ home bp monitoring:  high on first check, usually near normal after waiting 10 min to recheck. He doesn't recall specific #s.  ROS as above, plus--> no fevers, no CP, no SOB, no wheezing, no cough, no dizziness, no HAs, no rashes, no melena/hematochezia.  No polyuria or polydipsia.  No myalgias or arthralgias.  No focal weakness, paresthesias, or tremors.  No acute vision or hearing abnormalities.  No dysuria or unusual/new urinary urgency or frequency.  No recent changes in lower legs. No n/v/d or abd pain.  No palpitations.     Past Medical History:  Diagnosis Date   B12 deficiency    Chronic low back pain    Dr. Hardin Negus is pain mgmt MD   Diabetes mellitus with complication ([place])    DPN + microalbuminuria   GERD (gastroesophageal reflux disease)    Hypercholesterolemia    Only mild elevation but statin indicated due to comorbid DM.  Atorva -->intol (myalgias).  Pt declines statin re-try as of 09/2020.   Hypertension    OSA (obstructive sleep apnea)    on nighttime home O2 (refuses to wear CPAP because of claustrophobia)   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 + chronic lumbar radiculopathy   Persistent asthma    Never smoker   Stable angina ([place])    nl myoview 12/07  followed by Dr Johnsie Cancel   Statin intolerance    myalgias   Subclinical hypothyroidism 06/2017    Past Surgical History:  Procedure Laterality  Date   LUMBAR SPINE SURGERY     lumbar laminectomy with hardware stabilization, with ensuing arachnoiditis   URETHRAL STRICTURE DILATATION  1996 and 2004.   Laser surgery.    Outpatient Medications Prior to Visit  Medication Sig Dispense Refill   Acetaminophen (TYLENOL PO) Take by mouth as needed.     blood glucose meter kit and supplies KIT Dispense based on patient and insurance preference. Use to check glucose 4 times daily. 1 each 0   esomeprazole (NEXIUM) 40 MG capsule Take 1 capsule (40 mg total) by mouth daily. 90 capsule 3   furosemide (LASIX) 20 MG tablet TAKE 1 TABLET BY MOUTH EVERY  MORNING AS NEEDED FOR SWELLING. PLEASE MAKE APPT 90 tablet 1   glucose blood (FREESTYLE TEST STRIPS) test strip Use to check glucose 4 times daily. 200 each 4   Insulin Pen Needle (PEN NEEDLES 31GX5/16") 31G X 8 MM MISC Use as directed twice daily for medication injection 100 each 1   lisinopril (ZESTRIL) 20 MG tablet Take 1 tablet (20 mg total) by mouth daily. 30 tablet 11   methocarbamol (ROBAXIN) 500 MG tablet Take 500 mg by mouth 3 (three) times daily as needed.     metoprolol tartrate (LOPRESSOR) 50 MG tablet TAKE 1 TABLET BY MOUTH TWICE A DAY 180 tablet 0   morphine (MS CONTIN) 30 MG 12 hr tablet Take by mouth.     Multiple Vitamin (MULTIVITAMIN) tablet Take 1 tablet by mouth daily.     venlafaxine XR (EFFEXOR-XR) 150 MG 24 hr capsule Take 1 capsule by mouth 2 (two) times daily.  3   TRESIBA FLEXTOUCH 100 UNIT/ML FlexTouch Pen INJECT 70 UNITS UNDER THE SKIN ONCE DAILY 15 mL 1   nitroGLYCERIN (NITROSTAT) 0.4 MG SL tablet Place 1 tablet (0.4 mg total) under the tongue every 5 (five) minutes as needed. (Patient not taking: No sig reported) 30 tablet 1   No facility-administered medications prior to visit.    Allergies  Allergen Reactions   Ace Inhibitors     Kidney failure   Atorvastatin Other (See Comments)    Arm pain   Prednisone     REACTION: Anxiety   Septra [Sulfamethoxazole-Trimethoprim]    Metformin And Related Diarrhea    ROS As per HPI  PE: Vitals with BMI 12/25/2020 11/05/2020 09/12/2020  Height 6' 1.5" (No Data) 6' 1.5"  Weight 325 lbs 3 oz (No Data) 322 lbs 6 oz  BMI 74.12 - 87.86  Systolic 767 (No Data) 209  Diastolic 470 (No Data) 81  Pulse 90 - 108     Gen: Alert, well appearing.  Patient is oriented to person, place, time, and situation. AFFECT: pleasant, lucid thought and speech. CV: RRR, no m/r/g.   LUNGS: CTA bilat, nonlabored resps, good aeration in all lung fields. EXT: no clubbing or cyanosis.  Trace bilat LL pitting edema.    LABS:  Lab Results   Component Value Date   TSH 1.84 09/12/2020   Lab Results  Component Value Date   WBC 12.9 (H) 09/12/2020   HGB 15.4 09/12/2020   HCT 46.8 09/12/2020   MCV 84.6 09/12/2020   PLT 348.0 09/12/2020   Lab Results  Component Value Date   CREATININE 0.86 09/12/2020   BUN 21 09/12/2020   NA 139 09/12/2020   K 4.2 09/12/2020   CL 99 09/12/2020   CO2 31 09/12/2020   Lab Results  Component Value Date   ALT 14 09/12/2020   AST 12 09/12/2020  ALKPHOS 89 09/12/2020   BILITOT 0.6 09/12/2020   Lab Results  Component Value Date   CHOL 183 09/12/2020   Lab Results  Component Value Date   HDL 47.60 09/12/2020   Lab Results  Component Value Date   LDLCALC 113 (H) 09/12/2020   Lab Results  Component Value Date   TRIG 114.0 09/12/2020   Lab Results  Component Value Date   CHOLHDL 4 09/12/2020   Lab Results  Component Value Date   PSA 0.15 09/12/2020   PSA 0.2 10/18/2019   PSA 0.27 06/07/2017   Lab Results  Component Value Date   HGBA1C 9.4 (H) 09/12/2020   POC Hba1c today is 12.6%  IMPRESSION AND PLAN:  1) DM 2, poor control.  POC Hba1c today is 12.6% Noncompliant with insulin.  SGL2-I or GLP-1 agonist would be nice but pt says too cost prohibitive. He is stressed and in constant severe back pain and does not want to change any therapies at this time.  2) HTN, poor control. Would like to increase lisinopril for this and to help his proteinuria but he is not wanting to change anything at this time in the context of all of his increased life stress and back pain. Lytes/cr consistently stable, will defer these labs today and check these again when I see him in 30mo  3) Urinary obstruction: he is describing dribbling, very slow emptying, same sx's as when he has had urethral stricture in the past.  Has had to have laser procedure for this but this was at least several years ago.  Will refer to [facility].  4) HLD, hx of statin-induced myopathy: pt declines any  further trial of statins or other cholesterol medication.  Of note: He is upset b/c his life insurance was recently cancelled when he was being assessed by a salesman for a different policy and it was denied b/c they said medical records stated that he has copd. In review of his problem list today it does have copd listed from back in 2012 when he was seeing a different PCP.  Pt does not have this condition.  I'll take this off his problem list.  The only lung dz I have listed in his PMH is obstructive sleep apnea.  An After Visit Summary was printed and given to the patient.  FOLLOW UP: Return in about 3 months (around 03/26/2021) for routine chronic illness f/u. Cpe 09/2021  Signed:  PCrissie Sickles MD           12/25/2020

## 2021-01-27 ENCOUNTER — Other Ambulatory Visit: Payer: Self-pay | Admitting: Family Medicine

## 2021-02-16 DIAGNOSIS — G894 Chronic pain syndrome: Secondary | ICD-10-CM | POA: Diagnosis not present

## 2021-02-16 DIAGNOSIS — Z79891 Long term (current) use of opiate analgesic: Secondary | ICD-10-CM | POA: Diagnosis not present

## 2021-02-16 DIAGNOSIS — M961 Postlaminectomy syndrome, not elsewhere classified: Secondary | ICD-10-CM | POA: Diagnosis not present

## 2021-02-16 DIAGNOSIS — E1142 Type 2 diabetes mellitus with diabetic polyneuropathy: Secondary | ICD-10-CM | POA: Diagnosis not present

## 2021-03-23 ENCOUNTER — Other Ambulatory Visit: Payer: Self-pay | Admitting: Family Medicine

## 2021-03-25 ENCOUNTER — Ambulatory Visit: Payer: HMO | Admitting: Family Medicine

## 2021-04-01 ENCOUNTER — Telehealth: Payer: Self-pay

## 2021-04-01 ENCOUNTER — Other Ambulatory Visit: Payer: Self-pay

## 2021-04-01 ENCOUNTER — Encounter: Payer: Self-pay | Admitting: Family Medicine

## 2021-04-01 ENCOUNTER — Ambulatory Visit (INDEPENDENT_AMBULATORY_CARE_PROVIDER_SITE_OTHER): Payer: HMO | Admitting: Family Medicine

## 2021-04-01 VITALS — BP 160/90 | HR 113 | Temp 98.2°F | Ht 73.5 in | Wt 325.2 lb

## 2021-04-01 DIAGNOSIS — G720 Drug-induced myopathy: Secondary | ICD-10-CM

## 2021-04-01 DIAGNOSIS — I10 Essential (primary) hypertension: Secondary | ICD-10-CM | POA: Diagnosis not present

## 2021-04-01 DIAGNOSIS — E7800 Pure hypercholesterolemia, unspecified: Secondary | ICD-10-CM

## 2021-04-01 DIAGNOSIS — N35919 Unspecified urethral stricture, male, unspecified site: Secondary | ICD-10-CM

## 2021-04-01 DIAGNOSIS — Z794 Long term (current) use of insulin: Secondary | ICD-10-CM | POA: Diagnosis not present

## 2021-04-01 DIAGNOSIS — T466X5A Adverse effect of antihyperlipidemic and antiarteriosclerotic drugs, initial encounter: Secondary | ICD-10-CM | POA: Diagnosis not present

## 2021-04-01 DIAGNOSIS — E1140 Type 2 diabetes mellitus with diabetic neuropathy, unspecified: Secondary | ICD-10-CM | POA: Diagnosis not present

## 2021-04-01 MED ORDER — FUROSEMIDE 20 MG PO TABS: ORAL_TABLET | ORAL | 1 refills | Status: DC

## 2021-04-01 MED ORDER — NITROGLYCERIN 0.4 MG SL SUBL: 0.4000 mg | SUBLINGUAL_TABLET | SUBLINGUAL | 1 refills | Status: DC | PRN

## 2021-04-01 NOTE — Progress Notes (Signed)
OFFICE VISIT  04/01/2021  CC:  Chief Complaint  Patient presents with   Follow-up    RCI; not fasting   HPI:    Patient is a 55 y.o. male who presents for 3 mo f/u DM, HTN, and HLD with hx of statin-induced myopathy. A/P as of last visit: "1) DM 2, poor control.  POC Hba1c today is 12.6% Noncompliant with insulin.  SGL2-I or GLP-1 agonist would be nice but pt says too cost prohibitive. He is stressed and in constant severe back pain and does not want to change any therapies at this time.   2) HTN, poor control. Would like to increase lisinopril for this and to help his proteinuria but he is not wanting to change anything at this time in the context of all of his increased life stress and back pain. Lytes/cr consistently stable, will defer these labs today and check these again when I see him in 77mo   3) Urinary obstruction: he is describing dribbling, very slow emptying, same sx's as when he has had urethral stricture in the past.  Has had to have laser procedure for this but this was at least several years ago.  [facility] to [facility].   4) HLD, hx of statin-induced myopathy: pt declines any further trial of statins or other cholesterol medication.   Of note: He is upset b/c his life insurance was recently cancelled when he was being assessed by a salesman for a different policy and it was denied b/c they said medical records stated that he has copd. In review of his problem list today it does have copd listed from back in 2012 when he was seeing a different PCP.  Pt does not have this condition.  I'll take this off his problem list.  The only lung dz I have listed in his PMH is obstructive sleep apnea."  INTERIM HX: FOshayfeels well other than his chronic low back pain. His blood pressure is consistently elevated after he has been walking due to the significant acute increase in pain during these times.  Once he rests for 10 to 15 minutes he rechecks his blood pressure and  is consistently around 130/80 per his report.  Diabetes: Reports glucoses in the 120-140 range fasting but he does not check any postprandial glucoses.  He does take 66 units of Tresiba twice a day.  Hyperlipidemia: He has had muscle aches and generalized weakness on multiple statins in the past.  He declines any further trial of statins.  ROS as above, plus--> no fevers, no CP, no SOB, no wheezing, no cough, no dizziness, no HAs, no rashes, no melena/hematochezia.  No polyuria or polydipsia.  No myalgias or arthralgias.  No focal weakness, paresthesias, or tremors.  No acute vision or hearing abnormalities.  No dysuria or unusual/new urinary urgency or frequency.  No recent changes in lower legs. No n/v/d or abd pain.  No palpitations.     Past Medical History:  Diagnosis Date   B12 deficiency    Chronic low back pain    Dr. PHardin Negusis pain mgmt MD   [place]    DPN + microalbuminuria   GERD (gastroesophageal reflux disease)    Hypercholesterolemia    Only mild elevation but statin indicated due to comorbid DM.  Atorva -->intol (myalgias).  Pt declines statin re-try as of 09/2020.   Hypertension    OSA (obstructive sleep apnea)    on nighttime home O2 (refuses to wear  CPAP because of claustrophobia)   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 + chronic lumbar radiculopathy   Persistent asthma    Never smoker   Stable angina ([place])    nl myoview 12/07  followed by Dr Johnsie Cancel   Statin intolerance    myalgias   Subclinical hypothyroidism 06/2017    Past Surgical History:  Procedure Laterality Date   LUMBAR SPINE SURGERY     lumbar laminectomy with hardware stabilization, with ensuing arachnoiditis   [place] and 2004.   Laser surgery.    Outpatient Medications Prior to Visit  Medication Sig Dispense Refill   Acetaminophen (TYLENOL PO) Take by mouth as needed.     blood glucose meter kit and supplies KIT Dispense based on  patient and insurance preference. Use to check glucose 4 times daily. 1 each 0   esomeprazole (NEXIUM) 40 MG capsule Take 1 capsule (40 mg total) by mouth daily. 90 capsule 3   glucose blood (FREESTYLE TEST STRIPS) test strip Use to check glucose 4 times daily. 200 each 4   insulin degludec (TRESIBA FLEXTOUCH) 100 UNIT/ML FlexTouch Pen INJECT 70 UNITS UNDER THE SKIN ONCE DAILY (Patient taking differently: 66 Units. INJECT 66 UNITS UNDER THE SKIN IN THE MORNING AND AFTERNOON.) 15 mL 1   Insulin Pen Needle (PEN NEEDLES 31GX5/16") 31G X 8 MM MISC Use as directed twice daily for medication injection 100 each 1   lisinopril (ZESTRIL) 20 MG tablet Take 1 tablet (20 mg total) by mouth daily. 30 tablet 11   methocarbamol (ROBAXIN) 500 MG tablet Take 500 mg by mouth 3 (three) times daily as needed.     metoprolol tartrate (LOPRESSOR) 50 MG tablet TAKE 1 TABLET BY MOUTH TWICE A DAY 180 tablet 0   morphine (MS CONTIN) 30 MG 12 hr tablet Take by mouth.     Multiple Vitamin (MULTIVITAMIN) tablet Take 1 tablet by mouth daily.     venlafaxine XR (EFFEXOR-XR) 150 MG 24 hr capsule Take 1 capsule by mouth 2 (two) times daily.  3   furosemide (LASIX) 20 MG tablet TAKE 1 TABLET BY MOUTH EVERY MORNING AS NEEDED FOR SWELLING. PLEASE MAKE APPT 90 tablet 1   nitroGLYCERIN (NITROSTAT) 0.4 MG SL tablet Place 1 tablet (0.4 mg total) under the tongue every 5 (five) minutes as needed. (Patient not taking: Reported on 09/12/2020) 30 tablet 1   No facility-administered medications prior to visit.    Allergies  Allergen Reactions   Ace Inhibitors     Kidney failure   Atorvastatin Other (See Comments)    Arm pain   Prednisone     REACTION: Anxiety   Septra [Sulfamethoxazole-Trimethoprim]    Metformin And Related Diarrhea    ROS As per HPI  PE: Vitals with BMI 04/01/2021 04/01/2021 12/25/2020  Height - 6' 1.5" 6' 1.5"  Weight - 325 lbs 3 oz 325 lbs 3 oz  BMI - 53.61 44.31  Systolic 540 086 761  Diastolic 90 98 950   Pulse - 113 90    Physical Exam  Gen: Alert, well appearing.  Patient is oriented to person, place, time, and situation. AFFECT: pleasant, lucid thought and speech. CV: RRR, no m/r/g.   LUNGS: CTA bilat, nonlabored resps, good aeration in all lung fields. EXT: no clubbing or cyanosis.  no edema.    LABS:  Last CBC Lab Results  Component Value Date   WBC 12.9 (H) 09/12/2020   HGB 15.4 09/12/2020   HCT 46.8 09/12/2020  MCV 84.6 09/12/2020   MCH 28.5 10/09/2011   RDW 12.4 09/12/2020   PLT 348.0 16/01/9603   Last metabolic panel Lab Results  Component Value Date   GLUCOSE 199 (H) 09/12/2020   NA 139 09/12/2020   K 4.2 09/12/2020   CL 99 09/12/2020   CO2 31 09/12/2020   BUN 21 09/12/2020   CREATININE 0.86 09/12/2020   GFRNONAA >90 10/09/2011   CALCIUM 9.3 09/12/2020   PHOS 2.5 08/10/2012   PROT 7.1 09/12/2020   ALBUMIN 3.8 09/12/2020   BILITOT 0.6 09/12/2020   ALKPHOS 89 09/12/2020   AST 12 09/12/2020   ALT 14 09/12/2020   Last lipids Lab Results  Component Value Date   CHOL 183 09/12/2020   HDL 47.60 09/12/2020   LDLCALC 113 (H) 09/12/2020   LDLDIRECT 166.2 11/09/2013   TRIG 114.0 09/12/2020   CHOLHDL 4 09/12/2020   Last hemoglobin A1c Lab Results  Component Value Date   HGBA1C 12.6 (A) 12/25/2020   HGBA1C 12.6 12/25/2020   HGBA1C 12.6 (A) 12/25/2020   HGBA1C 12.6 (A) 12/25/2020   Last thyroid functions Lab Results  Component Value Date   TSH 1.84 09/12/2020   T3TOTAL 127 10/18/2019   Lab Results  Component Value Date   [id number] 782 10/18/2019    IMPRESSION AND PLAN:  #1 diabetes with complications--microalbuminuria and diabetic peripheral neuropathy. Poor control, usually only intermittent compliance with insulin due to fear of weight gain.  However last 3 months he says he is taking it daily as directed. Hemoglobin A1c today.  #2 hypertension, poor control.  Patient favors no increase or addition of medication today. He is convinced  that his blood pressure is fine as long as he is not walking. Electrolytes and creatinine checked today.  3.  Hyperlipidemia.  History of statin induced myalgia/myopathy. He is open to PCSK9-I if he were to qualify. Will refer to advanced lipid clinic today. Fasting lipid panel today (he has eaten only a banana today).  An After Visit Summary was printed and given to the patient.  FOLLOW UP: Return in about 3 months (around 06/30/2021) for routine chronic illness f/u. Next cpe 09/2021  Signed:  Crissie Sickles, MD           04/01/2021

## 2021-04-01 NOTE — Telephone Encounter (Signed)
Pt had a referral placed in Sept for [facility] for urethra stricture. Pt states he was not contacted to schedule. New referral pending

## 2021-04-01 NOTE — Patient Instructions (Signed)
Call your insurer and ask if mounjaro or byetta are covered (injections that help lower A1c and help with weight loss)

## 2021-04-01 NOTE — Telephone Encounter (Signed)
OK, referral order signed.

## 2021-04-02 LAB — CBC WITH DIFFERENTIAL/PLATELET
Basophils Absolute: 0.1 10*3/uL (ref 0.0–0.1)
Basophils Relative: 0.9 % (ref 0.0–3.0)
Eosinophils Absolute: 0.2 10*3/uL (ref 0.0–0.7)
Eosinophils Relative: 1.8 % (ref 0.0–5.0)
HCT: 47.7 % (ref 39.0–52.0)
Hemoglobin: 15.3 g/dL (ref 13.0–17.0)
Lymphocytes Relative: 16 % (ref 12.0–46.0)
Lymphs Abs: 1.7 10*3/uL (ref 0.7–4.0)
MCHC: 32.1 g/dL (ref 30.0–36.0)
MCV: 83.3 fl (ref 78.0–100.0)
Monocytes Absolute: 1.2 10*3/uL — ABNORMAL HIGH (ref 0.1–1.0)
Monocytes Relative: 11.2 % (ref 3.0–12.0)
Neutro Abs: 7.5 10*3/uL (ref 1.4–7.7)
Neutrophils Relative %: 70.1 % (ref 43.0–77.0)
Platelets: 307 10*3/uL (ref 150.0–400.0)
RBC: 5.72 Mil/uL (ref 4.22–5.81)
RDW: 13.6 % (ref 11.5–15.5)
WBC: 10.7 10*3/uL — ABNORMAL HIGH (ref 4.0–10.5)

## 2021-04-02 LAB — COMPREHENSIVE METABOLIC PANEL
ALT: 13 U/L (ref 0–53)
AST: 14 U/L (ref 0–37)
Albumin: 4 g/dL (ref 3.5–5.2)
Alkaline Phosphatase: 78 U/L (ref 39–117)
BUN: 18 mg/dL (ref 6–23)
CO2: 31 mEq/L (ref 19–32)
Calcium: 9.4 mg/dL (ref 8.4–10.5)
Chloride: 96 mEq/L (ref 96–112)
Creatinine, Ser: 0.92 mg/dL (ref 0.40–1.50)
GFR: 94.03 mL/min (ref >60.00)
Glucose, Bld: 280 mg/dL — ABNORMAL HIGH (ref 70–99)
Potassium: 4.4 mEq/L (ref 3.5–5.1)
Sodium: 136 mEq/L (ref 135–145)
Total Bilirubin: 0.5 mg/dL (ref 0.2–1.2)
Total Protein: 7.6 g/dL (ref 6.0–8.3)

## 2021-04-02 LAB — LIPID PANEL
Cholesterol: 189 mg/dL (ref 0–200)
HDL: 48.2 mg/dL (ref >39.00)
LDL Cholesterol: 117 mg/dL — ABNORMAL HIGH (ref 0–99)
NonHDL: 141.1
Total CHOL/HDL Ratio: 4
Triglycerides: 120 mg/dL (ref 0.0–149.0)
VLDL: 24 mg/dL (ref 0.0–40.0)

## 2021-04-02 LAB — HEMOGLOBIN A1C: Hgb A1c MFr Bld: 10.2 % — ABNORMAL HIGH (ref 4.6–6.5)

## 2021-04-02 NOTE — Telephone Encounter (Signed)
Pt advised referral update.

## 2021-04-13 DIAGNOSIS — M961 Postlaminectomy syndrome, not elsewhere classified: Secondary | ICD-10-CM | POA: Diagnosis not present

## 2021-04-13 DIAGNOSIS — G894 Chronic pain syndrome: Secondary | ICD-10-CM | POA: Diagnosis not present

## 2021-04-13 DIAGNOSIS — Z79891 Long term (current) use of opiate analgesic: Secondary | ICD-10-CM | POA: Diagnosis not present

## 2021-04-13 DIAGNOSIS — E1142 Type 2 diabetes mellitus with diabetic polyneuropathy: Secondary | ICD-10-CM | POA: Diagnosis not present

## 2021-04-21 ENCOUNTER — Encounter: Payer: Self-pay | Admitting: Internal Medicine

## 2021-04-27 ENCOUNTER — Telehealth: Payer: Self-pay | Admitting: *Deleted

## 2021-04-27 NOTE — Telephone Encounter (Signed)
Called patient, left a message for him to return my call. Is patient on Oxygen?

## 2021-04-29 NOTE — Telephone Encounter (Signed)
Spoke with the patient-he denies any sleep apnea and COPD,patient denies home Oxygen and CPAP use. He states his dr was suppose to take that off his chart. I reminded the patient of his phone PV.

## 2021-05-04 ENCOUNTER — Other Ambulatory Visit: Payer: Self-pay | Admitting: Family Medicine

## 2021-05-04 DIAGNOSIS — E118 Type 2 diabetes mellitus with unspecified complications: Secondary | ICD-10-CM

## 2021-05-08 ENCOUNTER — Other Ambulatory Visit: Payer: Self-pay

## 2021-05-08 ENCOUNTER — Ambulatory Visit (AMBULATORY_SURGERY_CENTER): Payer: Self-pay

## 2021-05-08 VITALS — Ht 73.0 in | Wt 320.0 lb

## 2021-05-08 DIAGNOSIS — Z1211 Encounter for screening for malignant neoplasm of colon: Secondary | ICD-10-CM

## 2021-05-08 MED ORDER — PEG 3350-KCL-NA BICARB-NACL 420 G PO SOLR: 4000.0000 mL | Freq: Once | ORAL | 0 refills | Status: AC

## 2021-05-08 NOTE — Progress Notes (Signed)
Denies allergies to eggs or soy products. Denies complication of anesthesia or sedation. Denies use of weight loss medication. Denies use of O2.   Emmi instructions given for colonoscopy.  

## 2021-05-16 ENCOUNTER — Other Ambulatory Visit: Payer: Self-pay | Admitting: Family Medicine

## 2021-05-18 ENCOUNTER — Encounter: Payer: Self-pay | Admitting: Internal Medicine

## 2021-05-22 ENCOUNTER — Encounter: Payer: Self-pay | Admitting: Internal Medicine

## 2021-05-22 ENCOUNTER — Ambulatory Visit (AMBULATORY_SURGERY_CENTER): Payer: HMO | Admitting: Internal Medicine

## 2021-05-22 ENCOUNTER — Other Ambulatory Visit: Payer: Self-pay

## 2021-05-22 VITALS — BP 166/78 | HR 97 | Temp 96.0°F | Resp 15 | Ht 73.5 in | Wt 325.0 lb

## 2021-05-22 DIAGNOSIS — D120 Benign neoplasm of cecum: Secondary | ICD-10-CM

## 2021-05-22 DIAGNOSIS — Z1211 Encounter for screening for malignant neoplasm of colon: Secondary | ICD-10-CM | POA: Diagnosis not present

## 2021-05-22 DIAGNOSIS — I2510 Atherosclerotic heart disease of native coronary artery without angina pectoris: Secondary | ICD-10-CM | POA: Diagnosis not present

## 2021-05-22 DIAGNOSIS — D122 Benign neoplasm of ascending colon: Secondary | ICD-10-CM | POA: Diagnosis not present

## 2021-05-22 DIAGNOSIS — G4733 Obstructive sleep apnea (adult) (pediatric): Secondary | ICD-10-CM | POA: Diagnosis not present

## 2021-05-22 DIAGNOSIS — Z86010 Personal history of colonic polyps: Secondary | ICD-10-CM

## 2021-05-22 DIAGNOSIS — E119 Type 2 diabetes mellitus without complications: Secondary | ICD-10-CM | POA: Diagnosis not present

## 2021-05-22 DIAGNOSIS — Z860101 Personal history of adenomatous and serrated colon polyps: Secondary | ICD-10-CM

## 2021-05-22 DIAGNOSIS — I10 Essential (primary) hypertension: Secondary | ICD-10-CM | POA: Diagnosis not present

## 2021-05-22 HISTORY — DX: Personal history of colonic polyps: Z86.010

## 2021-05-22 HISTORY — DX: Personal history of adenomatous and serrated colon polyps: Z86.0101

## 2021-05-22 HISTORY — PX: COLONOSCOPY: SHX174

## 2021-05-22 MED ORDER — SODIUM CHLORIDE 0.9 % IV SOLN: 500.0000 mL | INTRAVENOUS | Status: DC

## 2021-05-22 NOTE — Op Note (Signed)
[place] Patient Name: Dylan Owen Procedure Date: 05/22/2021 3:08 PM MRN: [id number] Endoscopist: Sonny Masters "Dylan Owen ,  Age: 56 Referring MD:  Date of Birth: [birth year] Gender: Male Account #: [id number] Procedure:                Colonoscopy Indications:              Screening for colorectal malignant neoplasm, This                            is the patient's first colonoscopy Medicines:                Monitored Anesthesia Care Procedure:                Pre-Anesthesia Assessment:                           - Prior to the procedure, a History and Physical                            was performed, and patient medications and                            allergies were reviewed. The patient's tolerance of                            previous anesthesia was also reviewed. The risks                            and benefits of the procedure and the sedation                            options and risks were discussed with the patient.                            All questions were answered, and informed consent                            was obtained. Prior Anticoagulants: The patient has                            taken no previous anticoagulant or antiplatelet                            agents. ASA Grade Assessment: III - A patient with                            severe systemic disease. After reviewing the risks                            and benefits, the patient was deemed in                            satisfactory condition to undergo the procedure.  After obtaining informed consent, the colonoscope                            was passed under direct vision. Throughout the                            procedure, the patient's blood pressure, pulse, and                            oxygen saturations were monitored continuously. The                            Olympus CF-HQ190L [id number]) Colonoscope was                            introduced through the  anus and advanced to the the                            terminal ileum. The colonoscopy was performed                            without difficulty. The patient tolerated the                            procedure well. The quality of the bowel                            preparation was adequate. The terminal ileum,                            ileocecal valve, appendiceal orifice, and rectum                            were photographed. Scope In: 3:15:06 PM Scope Out: 3:36:42 PM Scope Withdrawal Time: 0 hours 18 minutes 34 seconds  Total Procedure Duration: 0 hours 21 minutes 36 seconds  Findings:                 The terminal ileum appeared normal.                           Three sessile polyps were found in the ascending                            colon and cecum. The polyps were 4 to 6 mm in size.                            These polyps were removed with a cold snare.                            Resection and retrieval were complete.                           Non-bleeding internal hemorrhoids were found during  retroflexion. Complications:            No immediate complications. Estimated Blood Loss:     Estimated blood loss was minimal. Impression:               - The examined portion of the ileum was normal.                           - Three 4 to 6 mm polyps in the ascending colon and                            in the cecum, removed with a cold snare. Resected                            and retrieved.                           - Non-bleeding internal hemorrhoids. Recommendation:           - Discharge patient to home (with escort).                           - Await pathology results.                           - The findings and recommendations were discussed                            with the patient. Sonny Masters "Dylan Owen,  05/22/2021 3:40:05 PM

## 2021-05-22 NOTE — Progress Notes (Signed)
Report to PACU, RN, vss, BBS= Clear.  

## 2021-05-22 NOTE — Progress Notes (Signed)
GASTROENTEROLOGY PROCEDURE H&P NOTE   Primary Care Physician: Tammi Sou, MD    Reason for Procedure:   Colon cancer screening.  Plan:    Colonoscopy  Patient is appropriate for endoscopic procedure(s) in the ambulatory ([facility]) setting.  The nature of the procedure, as well as the risks, benefits, and alternatives were carefully and thoroughly reviewed with the patient. Ample time for discussion and questions allowed. The patient understood, was satisfied, and agreed to proceed.     HPI: NATALIE LECLAIRE is a 56 y.o. male who presents for colonoscopy for colon cancer screening. Denies blood in stools, changes in bowel habits. Denies fam hx of colon cancer.  Past Medical History:  Diagnosis Date   B12 deficiency    Blood transfusion without reported diagnosis    Chronic low back pain    Dr. Hardin Negus is pain mgmt MD   Depression    Diabetes mellitus with complication ([place])    DPN + microalbuminuria   GERD (gastroesophageal reflux disease)    Heart murmur    Hypercholesterolemia    Only mild elevation but statin indicated due to comorbid DM.  Atorva -->intol (myalgias).  Pt declines statin re-try as of 09/2020.   Hypertension    OSA (obstructive sleep apnea)    on nighttime home O2 (refuses to wear CPAP because of claustrophobia)   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 + chronic lumbar radiculopathy   Persistent asthma    Never smoker   Seizures ([place])    Stable angina ([place])    nl myoview 12/07  followed by Dr Johnsie Cancel   Statin intolerance    myalgias   Subclinical hypothyroidism 06/2017    Past Surgical History:  Procedure Laterality Date   LUMBAR SPINE SURGERY     lumbar laminectomy with hardware stabilization, with ensuing arachnoiditis   [place] and 2004.   Laser surgery.    Prior to Admission medications   Medication Sig Start Date End Date Taking? Authorizing Provider  esomeprazole (NEXIUM) 40 MG capsule Take 1 capsule (40  mg total) by mouth daily. 10/18/19  Yes McGowen, Adrian Blackwater, MD  FREESTYLE TEST STRIPS test strip USE TO CHECK GLUCOSE 4 TIMES DAILY. 05/05/21  Yes McGowen, Adrian Blackwater, MD  furosemide (LASIX) 20 MG tablet TAKE 1 TABLET BY MOUTH EVERY MORNING AS NEEDED FOR SWELLING. 04/01/21  Yes McGowen, Adrian Blackwater, MD  insulin degludec (TRESIBA FLEXTOUCH) 100 UNIT/ML FlexTouch Pen INJECT 70 UNITS UNDER THE SKIN ONCE DAILY Patient taking differently: 66 Units. INJECT 66 UNITS UNDER THE SKIN IN THE MORNING AND AFTERNOON. 03/23/21  Yes McGowen, Adrian Blackwater, MD  Insulin Pen Needle (PEN NEEDLES 31GX5/16") 31G X 8 MM MISC Use as directed twice daily for medication injection 04/03/20  Yes McGowen, Adrian Blackwater, MD  lisinopril (ZESTRIL) 20 MG tablet TAKE 1 TABLET BY MOUTH EVERY DAY 05/18/21  Yes McGowen, Adrian Blackwater, MD  morphine (MS CONTIN) 30 MG 12 hr tablet Take by mouth. 10/05/19  Yes [provider]  Multiple Vitamin (MULTIVITAMIN) tablet Take 1 tablet by mouth daily.   Yes [provider]  venlafaxine XR (EFFEXOR-XR) 150 MG 24 hr capsule Take 1 capsule by mouth 2 (two) times daily. 09/26/15  Yes [provider]  Acetaminophen (TYLENOL PO) Take by mouth as needed.    [provider]  blood glucose meter kit and supplies KIT Dispense based on patient and insurance preference. Use to check glucose 4 times daily. 10/22/19   McGowen,  Adrian Blackwater, MD  methocarbamol (ROBAXIN) 500 MG tablet Take 500 mg by mouth 3 (three) times daily as needed. 10/05/19   [provider]  metoprolol tartrate (LOPRESSOR) 50 MG tablet TAKE 1 TABLET BY MOUTH TWICE A DAY 01/27/21   McGowen, Adrian Blackwater, MD  nitroGLYCERIN (NITROSTAT) 0.4 MG SL tablet Place 1 tablet (0.4 mg total) under the tongue every 5 (five) minutes as needed. 04/01/21   McGowenAdrian Blackwater, MD    Current Outpatient Medications  Medication Sig Dispense Refill   esomeprazole (NEXIUM) 40 MG capsule Take 1 capsule (40 mg total) by mouth daily. 90 capsule 3    FREESTYLE TEST STRIPS test strip USE TO CHECK GLUCOSE 4 TIMES DAILY. 200 strip 4   furosemide (LASIX) 20 MG tablet TAKE 1 TABLET BY MOUTH EVERY MORNING AS NEEDED FOR SWELLING. 90 tablet 1   insulin degludec (TRESIBA FLEXTOUCH) 100 UNIT/ML FlexTouch Pen INJECT 70 UNITS UNDER THE SKIN ONCE DAILY (Patient taking differently: 66 Units. INJECT 66 UNITS UNDER THE SKIN IN THE MORNING AND AFTERNOON.) 15 mL 1   Insulin Pen Needle (PEN NEEDLES 31GX5/16") 31G X 8 MM MISC Use as directed twice daily for medication injection 100 each 1   lisinopril (ZESTRIL) 20 MG tablet TAKE 1 TABLET BY MOUTH EVERY DAY 90 tablet 1   morphine (MS CONTIN) 30 MG 12 hr tablet Take by mouth.     Multiple Vitamin (MULTIVITAMIN) tablet Take 1 tablet by mouth daily.     venlafaxine XR (EFFEXOR-XR) 150 MG 24 hr capsule Take 1 capsule by mouth 2 (two) times daily.  3   Acetaminophen (TYLENOL PO) Take by mouth as needed.     blood glucose meter kit and supplies KIT Dispense based on patient and insurance preference. Use to check glucose 4 times daily. 1 each 0   methocarbamol (ROBAXIN) 500 MG tablet Take 500 mg by mouth 3 (three) times daily as needed.     metoprolol tartrate (LOPRESSOR) 50 MG tablet TAKE 1 TABLET BY MOUTH TWICE A DAY 180 tablet 0   nitroGLYCERIN (NITROSTAT) 0.4 MG SL tablet Place 1 tablet (0.4 mg total) under the tongue every 5 (five) minutes as needed. 30 tablet 1   Current Facility-Administered Medications  Medication Dose Route Frequency Provider Last Rate Last Admin   0.9 %  sodium chloride infusion  500 mL Intravenous Continuous Sharyn Creamer, MD        Allergies as of 05/22/2021 - Review Complete 05/22/2021  Allergen Reaction Noted   Ace inhibitors  08/10/2012   Atorvastatin Other (See Comments) 10/20/2015   Prednisone     Septra [sulfamethoxazole-trimethoprim]  10/11/2011   Metformin and related Diarrhea 10/20/2015    Family History  Problem Relation Age of Onset   Cancer Paternal Grandfather         prostate   Heart disease Other    Hypertension Other    Other Other        Emotional Illness   Colon cancer Neg Hx    Esophageal cancer Neg Hx    Rectal cancer Neg Hx    Stomach cancer Neg Hx     Social History   Socioeconomic History   Marital status: Married    Spouse name: Not on file   Number of children: Not on file   Years of education: Not on file   Highest education level: Not on file  Occupational History   Not on file  Tobacco Use   Smoking status: Never  Smokeless tobacco: Current    Types: Chew   Tobacco comments:    dip  Substance and Sexual Activity   Alcohol use: No   Drug use: No   Sexual activity: Yes  Other Topics Concern   Not on file  Social History Narrative   Married, 4 children.   Occupation: disabled since 2000.     Prior to disability he worked for [employer].   Orig from [place].   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  No hx of drug abuse.   Chews tobacco since age 67 yrs.   Regular exercise:  No               Social Determinants of Health   Financial Resource Strain: Low Risk    Difficulty of Paying Living Expenses: Not hard at all  Food Insecurity: No Food Insecurity   Worried About Charity fundraiser in the Last Year: Never true   Ran Out of Food in the Last Year: Never true  Transportation Needs: No Transportation Needs   Lack of Transportation (Medical): No   Lack of Transportation (Non-Medical): No  Physical Activity: Inactive   Days of Exercise per Week: 0 days   Minutes of Exercise per Session: 0 min  Stress: No Stress Concern Present   Feeling of Stress : Only a little  Social Connections: Moderately Isolated   Frequency of Communication with Friends and Family: Three times a week   Frequency of Social Gatherings with Friends and Family: Twice a week   Attends Religious Services: Never   Marine scientist or Organizations: No   Attends  Music therapist: Never   Marital Status: Married  Human resources officer Violence: Not At Risk   Fear of Current or Ex-Partner: No   Emotionally Abused: No   Physically Abused: No   Sexually Abused: No    Physical Exam: Vital signs in last 24 hours: BP (!) 211/77    Pulse (!) 110    Temp (!) 96 F (35.6 C) (Temporal)    Ht 6' 1.5" (1.867 m)    Wt (!) 325 lb (147.4 kg)    SpO2 95%    BMI 42.30 kg/m  GEN: NAD EYE: Sclerae anicteric ENT: MMM CV: Non-tachycardic Pulm: No increased work of breathing GI: Soft, NT/ND NEURO:  Alert & Oriented   Christia Reading, MD [facility]  05/22/2021 3:02 PM

## 2021-05-22 NOTE — Progress Notes (Signed)
Called to room to assist during endoscopic procedure.  Patient ID and intended procedure confirmed with present staff. Received instructions for my participation in the procedure from the performing physician.  

## 2021-05-22 NOTE — Patient Instructions (Signed)
Impression/Recommendations:  Polyp and hemorrhoid handouts given to patient.  Await pathology results.  YOU HAD AN ENDOSCOPIC PROCEDURE TODAY AT [facility]:   Refer to the procedure report that was given to you for any specific questions about what was found during the examination.  If the procedure report does not answer your questions, please call your gastroenterologist to clarify.  If you requested that your care partner not be given the details of your procedure findings, then the procedure report has been included in a sealed envelope for you to review at your convenience later.  YOU SHOULD EXPECT: Some feelings of bloating in the abdomen. Passage of more gas than usual.  Walking can help get rid of the air that was put into your GI tract during the procedure and reduce the bloating. If you had a lower endoscopy (such as a colonoscopy or flexible sigmoidoscopy) you may notice spotting of blood in your stool or on the toilet paper. If you underwent a bowel prep for your procedure, you may not have a normal bowel movement for a few days.  Please Note:  You might notice some irritation and congestion in your nose or some drainage.  This is from the oxygen used during your procedure.  There is no need for concern and it should clear up in a day or so.  SYMPTOMS TO REPORT IMMEDIATELY:  Following lower endoscopy (colonoscopy or flexible sigmoidoscopy):  Excessive amounts of blood in the stool  Significant tenderness or worsening of abdominal pains  Swelling of the abdomen that is new, acute  Fever of 100F or higher  For urgent or emergent issues, a gastroenterologist can be reached at any hour by calling [phone]. Do not use MyChart messaging for urgent concerns.    DIET:  We do recommend a small meal at first, but then you may proceed to your regular diet.  Drink plenty of fluids but you should avoid alcoholic beverages for 24 hours.  ACTIVITY:  You should plan to  take it easy for the rest of today and you should NOT DRIVE or use heavy machinery until tomorrow (because of the sedation medicines used during the test).    FOLLOW UP: Our staff will call the number listed on your records 48-72 hours following your procedure to check on you and address any questions or concerns that you may have regarding the information given to you following your procedure. If we do not reach you, we will leave a message.  We will attempt to reach you two times.  During this call, we will ask if you have developed any symptoms of COVID 19. If you develop any symptoms (ie: fever, flu-like symptoms, shortness of breath, cough etc.) before then, please call [phone].  If you test positive for Covid 19 in the 2 weeks post procedure, please call and report this information to [place].    If any biopsies were taken you will be contacted by phone or by letter within the next 1-3 weeks.  Please call us at [phone] if you have not heard about the biopsies in 3 weeks.    SIGNATURES/CONFIDENTIALITY: You and/or your care partner have signed paperwork which will be entered into your electronic medical record.  These signatures attest to the fact that that the information above on your After Visit Summary has been reviewed and is understood.  Full responsibility of the confidentiality of this discharge information lies with you and/or your care-partner.

## 2021-05-22 NOTE — Progress Notes (Signed)
Pt's states no medical or surgical changes since previsit or office visit. 

## 2021-05-27 ENCOUNTER — Telehealth: Payer: Self-pay | Admitting: *Deleted

## 2021-05-27 NOTE — Telephone Encounter (Signed)
°  Follow up Call-  Call back number 05/22/2021  Post procedure Call Back phone  # [phone]  Permission to leave phone message Yes  Some recent data might be hidden    LMOM to call back with any questions or concerns.  Also, call back if patient has developed fever, respiratory issues or been dx with COVID or had any family members or close contacts diagnosed since her procedure.

## 2021-05-27 NOTE — Telephone Encounter (Signed)
°  Follow up Call-  Call back number 05/22/2021  Post procedure Call Back phone  # [phone]  Permission to leave phone message Yes  Some recent data might be hidden    First attempt for follow up phone call. No answer at number given.  Left message on voicemail.

## 2021-05-29 ENCOUNTER — Encounter: Payer: Self-pay | Admitting: Internal Medicine

## 2021-06-03 ENCOUNTER — Telehealth: Payer: Self-pay | Admitting: Family Medicine

## 2021-06-03 DIAGNOSIS — E1149 Type 2 diabetes mellitus with other diabetic neurological complication: Secondary | ICD-10-CM

## 2021-06-05 ENCOUNTER — Other Ambulatory Visit: Payer: Self-pay | Admitting: Family Medicine

## 2021-06-05 NOTE — Telephone Encounter (Signed)
Patient calling to check on insulin status.  He is almost out and wants to make sure Dr. Anitra Lauth can approve for refills before end of day today. ? ? ?[facility] [place] ?

## 2021-06-05 NOTE — Telephone Encounter (Signed)
Rx sent in. LM for pt to return call. ?

## 2021-06-08 DIAGNOSIS — E1142 Type 2 diabetes mellitus with diabetic polyneuropathy: Secondary | ICD-10-CM | POA: Diagnosis not present

## 2021-06-08 DIAGNOSIS — M961 Postlaminectomy syndrome, not elsewhere classified: Secondary | ICD-10-CM | POA: Diagnosis not present

## 2021-06-08 DIAGNOSIS — Z79891 Long term (current) use of opiate analgesic: Secondary | ICD-10-CM | POA: Diagnosis not present

## 2021-06-08 DIAGNOSIS — G894 Chronic pain syndrome: Secondary | ICD-10-CM | POA: Diagnosis not present

## 2021-06-08 NOTE — Telephone Encounter (Signed)
Left detailed message on home number advising refill sent for pen needles ?

## 2021-06-13 ENCOUNTER — Other Ambulatory Visit: Payer: Self-pay | Admitting: Family Medicine

## 2021-07-01 ENCOUNTER — Ambulatory Visit (INDEPENDENT_AMBULATORY_CARE_PROVIDER_SITE_OTHER): Payer: HMO | Admitting: Family Medicine

## 2021-07-01 ENCOUNTER — Encounter: Payer: Self-pay | Admitting: Family Medicine

## 2021-07-01 VITALS — BP 179/85 | HR 88 | Temp 97.9°F | Ht 73.5 in | Wt 324.8 lb

## 2021-07-01 DIAGNOSIS — G8929 Other chronic pain: Secondary | ICD-10-CM | POA: Diagnosis not present

## 2021-07-01 DIAGNOSIS — I10 Essential (primary) hypertension: Secondary | ICD-10-CM

## 2021-07-01 DIAGNOSIS — Z794 Long term (current) use of insulin: Secondary | ICD-10-CM | POA: Diagnosis not present

## 2021-07-01 DIAGNOSIS — E1140 Type 2 diabetes mellitus with diabetic neuropathy, unspecified: Secondary | ICD-10-CM | POA: Diagnosis not present

## 2021-07-01 DIAGNOSIS — M25561 Pain in right knee: Secondary | ICD-10-CM | POA: Diagnosis not present

## 2021-07-01 DIAGNOSIS — E782 Mixed hyperlipidemia: Secondary | ICD-10-CM | POA: Diagnosis not present

## 2021-07-01 LAB — BASIC METABOLIC PANEL
BUN: 12 mg/dL (ref 6–23)
CO2: 30 mEq/L (ref 19–32)
Calcium: 8.7 mg/dL (ref 8.4–10.5)
Chloride: 98 mEq/L (ref 96–112)
Creatinine, Ser: 0.7 mg/dL (ref 0.40–1.50)
GFR: 103.98 mL/min (ref >60.00)
Glucose, Bld: 80 mg/dL (ref 70–99)
Potassium: 3.5 mEq/L (ref 3.5–5.1)
Sodium: 139 mEq/L (ref 135–145)

## 2021-07-01 LAB — HEMOGLOBIN A1C: Hgb A1c MFr Bld: 10.8 % — ABNORMAL HIGH (ref 4.6–6.5)

## 2021-07-01 MED ORDER — ESOMEPRAZOLE MAGNESIUM 40 MG PO CPDR: 40.0000 mg | DELAYED_RELEASE_CAPSULE | Freq: Every day | ORAL | 1 refills | Status: DC

## 2021-07-01 MED ORDER — METOPROLOL TARTRATE 50 MG PO TABS: 50.0000 mg | ORAL_TABLET | Freq: Two times a day (BID) | ORAL | 1 refills | Status: DC

## 2021-07-01 NOTE — Progress Notes (Signed)
OFFICE VISIT ? ?07/02/2021 ? ?CC:  ?Chief Complaint  ?Patient presents with  ? Diabetes  ? Hypertension  ? Hyperlipidemia  ?  Pt is fasting  ? ?HPI:   ? ?Patient is a 56 y.o. male who presents for 3 mo f/u DM 2, HTN, HLD. ?A/P as of last visit: ?"#1 diabetes with complications--microalbuminuria and diabetic peripheral neuropathy. ?Poor control, usually only intermittent compliance with insulin due to fear of weight gain.  However last 3 months he says he is taking it daily as directed. ?Hemoglobin A1c today. ?  ?#2 hypertension, poor control.  Patient favors no increase or addition of medication today. ?He is convinced that his blood pressure is fine as long as he is not walking. ?Electrolytes and creatinine checked today. ? ?3.  Hyperlipidemia.  History of statin induced myalgia/myopathy. ?He is open to PCSK9-I if he were to qualify. ?Will refer to advanced lipid clinic today. ?Fasting lipid panel today (he has eaten only a banana today)." ? ?INTERIM HX: ?He feels fine. ? ?My result note attached to his labs 04/01/22 ?"(Pt declines to take prandial insulin b/c he insists this is the cause of all his wt gain). ?Hba1c improved some-->10.2 % compared to 12.6% last time. ?Still need to increase insulin some--increase to 70 U every morning and 68 U every evening. ?Cholesterol mildly elevated, similar to past measurements ?All other labs normal. ?Keep the plan of going to the cholesterol specialist.  If he does not receive a call from them to schedule in the next 2 weeks then he should call us back to notify us.". ? ?Taking 70 units of Tresiba in the morning and 60 8 in the evening.  He estimates about 50 to 75% compliance with this.  Simply forgets. ?Home blood pressure monitoring shows his blood pressure up consistently but states it is only like this because he checks it after walking or when in more severe pain than usual.  He has not been open to increasing blood pressure medicine in the past. ? ?Referred to lipid  clinic last visit.  Back clinic tried to contact him several times and left voicemail and sent a letter.  They ended up having to close the referral. ? ?Has acute on chronic right knee pain lately.  Hurts around the anterior aspect of the, worse with weightbearing.  No swelling or redness that he can notice. ?States he had this evaluated by his pain management MD, Dr. Hardin Negus, last year or earlier this year and MRI was done.  Patient thinks he may have been told he has osteoarthritis.  He has not done physical therapy or had any injections in the past. ? ?ROS as above, plus--> no fevers, no CP, no SOB, no wheezing, no cough, no dizziness, no HAs, no rashes, no melena/hematochezia.  No polyuria or polydipsia.  No focal weakness, paresthesias, or tremors.  No acute vision or hearing abnormalities.  No dysuria or unusual/new urinary urgency or frequency.  No recent changes in lower legs. ?No n/v/d or abd pain.  No palpitations.   ? ? ?Past Medical History:  ?Diagnosis Date  ? B12 deficiency   ? Blood transfusion without reported diagnosis   ? Chronic low back pain   ? Dr. Hardin Negus is pain mgmt MD  ? Depression   ? Diabetes mellitus with complication ([place])   ? DPN + microalbuminuria  ? GERD (gastroesophageal reflux disease)   ? Heart murmur   ? History of adenomatous polyp of colon 05/22/2021  ? recall  7 yrs  ? Hypercholesterolemia   ? Only mild elevation but statin indicated due to comorbid DM.  Atorva -->intol (myalgias).  Pt declines statin re-try as of 09/2020.  ? Hypertension   ? OSA (obstructive sleep apnea)   ? on nighttime home O2 (refuses to wear CPAP because of claustrophobia)  ? Peripheral neuropathy   ? 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 Persistent asthma   ? Never smoker  ? Seizures ([place])   ? Stable angina ([facility])   ? nl myoview 12/07  followed by Dr Johnsie Cancel  ? Statin intolerance   ? myalgias  ? Subclinical hypothyroidism 06/2017  ? ? ?Past Surgical History:  ?Procedure Laterality Date  ?  COLONOSCOPY  05/22/2021  ? Adenomas--recall 7 years  ? LUMBAR SPINE SURGERY    ? lumbar laminectomy with hardware stabilization, with ensuing arachnoiditis  ? [place] and 2004.  ? Laser surgery.  ? ? ?Outpatient Medications Prior to Visit  ?Medication Sig Dispense Refill  ? Acetaminophen (TYLENOL PO) Take by mouth as needed.    ? blood glucose meter kit and supplies KIT Dispense based on patient and insurance preference. Use to check glucose 4 times daily. 1 each 0  ? FREESTYLE TEST STRIPS test strip USE TO CHECK GLUCOSE 4 TIMES DAILY. 200 strip 4  ? furosemide (LASIX) 20 MG tablet TAKE 1 TABLET BY MOUTH EVERY MORNING AS NEEDED FOR SWELLING. 90 tablet 1  ? insulin degludec (TRESIBA FLEXTOUCH) 100 UNIT/ML FlexTouch Pen Inject 66 Units into the skin 2 (two) times daily. INJECT 66 UNITS UNDER THE SKIN IN THE MORNING AND AFTERNOON. 56 mL 1  ? Insulin Pen Needle (B-D ULTRAFINE III SHORT PEN) 31G X 8 MM MISC USE AS DIRECTED TWICE DAILY FOR MEDICATION INJECTION 100 each 1  ? lisinopril (ZESTRIL) 20 MG tablet TAKE 1 TABLET BY MOUTH EVERY DAY 90 tablet 1  ? methocarbamol (ROBAXIN) 500 MG tablet Take 500 mg by mouth 3 (three) times daily as needed.    ? morphine (MS CONTIN) 30 MG 12 hr tablet Take by mouth.    ? Multiple Vitamin (MULTIVITAMIN) tablet Take 1 tablet by mouth daily.    ? venlafaxine XR (EFFEXOR-XR) 150 MG 24 hr capsule Take 1 capsule by mouth 2 (two) times daily.  3  ? nitroGLYCERIN (NITROSTAT) 0.4 MG SL tablet Place 1 tablet (0.4 mg total) under the tongue every 5 (five) minutes as needed. (Patient not taking: Reported on 07/01/2021) 30 tablet 1  ? esomeprazole (NEXIUM) 40 MG capsule Take 1 capsule (40 mg total) by mouth daily. 90 capsule 3  ? metoprolol tartrate (LOPRESSOR) 50 MG tablet TAKE 1 TABLET BY MOUTH TWICE A DAY 60 tablet 0  ? ?No facility-administered medications prior to visit.  ? ? ?Allergies  ?Allergen Reactions  ? Ace Inhibitors   ?  Kidney failure  ? Atorvastatin Other  (See Comments)  ?  Arm pain  ? Prednisone   ?  REACTION: Anxiety  ? Septra [Sulfamethoxazole-Trimethoprim]   ? Metformin And Related Diarrhea  ? ? ?ROS ?As per HPI ? ?PE: ? ?  07/01/2021  ?  1:30 PM 05/22/2021  ?  3:58 PM 05/22/2021  ?  3:48 PM  ?Vitals with BMI  ?Height 6' 1.5"    ?Weight 324 lbs 13 oz    ?BMI 42.27    ?Systolic 197 588 325  ?Diastolic 85 78 94  ?Pulse 88 97 97  ? ? ? ?Physical Exam ? ?Gen:  Alert, well appearing.  Patient is oriented to person, place, time, and situation. ?AFFECT: pleasant, lucid thought and speech. ?Foot exam -  no swelling, tenderness or skin or vascular lesions. Color and temperature is normal.  He has no sensation from the knees down into the toes.  Peripheral pulses are palpable. Toenails mildly thickened. ?Right knee: No erythema, warmth, or effusion noted.  Crepitus noted but no limitation in range of motion.  No instability mild tenderness to palpation in the peripatellar areas, some mild lateral greater than medial joint line pain tenderness as well.  Patellar grind negative. ? ? ? ?LABS:  ?Last CBC ?Lab Results  ?Component Value Date  ? WBC 10.7 (H) 04/01/2021  ? HGB 15.3 04/01/2021  ? HCT 47.7 04/01/2021  ? MCV 83.3 04/01/2021  ? MCH 28.5 10/09/2011  ? RDW 13.6 04/01/2021  ? PLT 307.0 04/01/2021  ? ?Last metabolic panel ?Lab Results  ?Component Value Date  ? GLUCOSE 80 07/01/2021  ? NA 139 07/01/2021  ? K 3.5 07/01/2021  ? CL 98 07/01/2021  ? CO2 30 07/01/2021  ? BUN 12 07/01/2021  ? CREATININE 0.70 07/01/2021  ? GFRNONAA >90 10/09/2011  ? CALCIUM 8.7 07/01/2021  ? PHOS 2.5 08/10/2012  ? PROT 7.6 04/01/2021  ? ALBUMIN 4.0 04/01/2021  ? BILITOT 0.5 04/01/2021  ? ALKPHOS 78 04/01/2021  ? AST 14 04/01/2021  ? ALT 13 04/01/2021  ? ?Last lipids ?Lab Results  ?Component Value Date  ? CHOL 189 04/01/2021  ? HDL 48.20 04/01/2021  ? LDLCALC 117 (H) 04/01/2021  ? LDLDIRECT 166.2 11/09/2013  ? TRIG 120.0 04/01/2021  ? CHOLHDL 4 04/01/2021  ? ?Last hemoglobin A1c ?Lab Results   ?Component Value Date  ? HGBA1C 10.8 (H) 07/01/2021  ? ?Last thyroid functions ?Lab Results  ?Component Value Date  ? TSH 1.84 09/12/2020  ? T3TOTAL 127 10/18/2019  ? ?IMPRESSION AND PLAN: ? ?#1 diabetes with severe dia

## 2021-07-03 ENCOUNTER — Emergency Department (HOSPITAL_BASED_OUTPATIENT_CLINIC_OR_DEPARTMENT_OTHER)
Admission: EM | Admit: 2021-07-03 | Discharge: 2021-07-03 | Disposition: A | Discharge status: Home / Self Care | Payer: HMO | Attending: Emergency Medicine | Admitting: Emergency Medicine

## 2021-07-03 ENCOUNTER — Encounter (HOSPITAL_BASED_OUTPATIENT_CLINIC_OR_DEPARTMENT_OTHER): Payer: Self-pay | Admitting: Emergency Medicine

## 2021-07-03 ENCOUNTER — Emergency Department (HOSPITAL_BASED_OUTPATIENT_CLINIC_OR_DEPARTMENT_OTHER): Payer: HMO | Admitting: Radiology

## 2021-07-03 ENCOUNTER — Other Ambulatory Visit: Payer: Self-pay

## 2021-07-03 DIAGNOSIS — M1712 Unilateral primary osteoarthritis, left knee: Secondary | ICD-10-CM | POA: Diagnosis not present

## 2021-07-03 DIAGNOSIS — Z794 Long term (current) use of insulin: Secondary | ICD-10-CM | POA: Diagnosis not present

## 2021-07-03 DIAGNOSIS — W01198A Fall on same level from slipping, tripping and stumbling with subsequent striking against other object, initial encounter: Secondary | ICD-10-CM | POA: Insufficient documentation

## 2021-07-03 DIAGNOSIS — Y9301 Activity, walking, marching and hiking: Secondary | ICD-10-CM | POA: Insufficient documentation

## 2021-07-03 DIAGNOSIS — Z043 Encounter for examination and observation following other accident: Secondary | ICD-10-CM | POA: Diagnosis not present

## 2021-07-03 DIAGNOSIS — M25572 Pain in left ankle and joints of left foot: Secondary | ICD-10-CM | POA: Insufficient documentation

## 2021-07-03 DIAGNOSIS — S80212A Abrasion, left knee, initial encounter: Secondary | ICD-10-CM | POA: Diagnosis not present

## 2021-07-03 DIAGNOSIS — M25562 Pain in left knee: Secondary | ICD-10-CM | POA: Diagnosis not present

## 2021-07-03 DIAGNOSIS — S8992XA Unspecified injury of left lower leg, initial encounter: Secondary | ICD-10-CM | POA: Diagnosis present

## 2021-07-03 NOTE — ED Provider Notes (Signed)
?[facility] ?Provider Note ? ? ?CSN: [id number] ?Arrival date & time: 07/03/21  1252 ? ?  ? ?History ? ?Chief Complaint  ?Patient presents with  ? Fall  ? ? ?Dylan Owen is a 56 y.o. male. ? ?Broke the edge of left forearm and his foot inverted and rolled on broke through the wood and he rolled his ankle ? ? ?Fall ?Patient is a 56 year old gentleman presented emergency room today with complaints of left knee pain and left ankle pain.  He states that earlier today he was walking on a wooden platform when his left foot broke with a wooden platform he was standing on it and he fell leftward states that he struck his left knee onto the ground knee cap first. ?He did not strike his head or lose consciousness.  He states that he is having left ankle and left knee pain. ? ?He has pain in general in his lower extremities although he also suffers from some peripheral neuropathy and states that he has somewhat decreased sensation in general. ? ?He also has had numerous spine surgeries and states that he generally has decree sensation from this and also some weakness.  He states he does not feel more weak than usual he states that his fall was because the piece of wood he was walking on broke.  He denies any new pain in his back or neck. ?Not on any anticoagulation ? ?  ? ?Home Medications ?Prior to Admission medications   ?Medication Sig Start Date End Date Taking? Authorizing Provider  ?Acetaminophen (TYLENOL PO) Take by mouth as needed.    [provider]  ?blood glucose meter kit and supplies KIT Dispense based on patient and insurance preference. Use to check glucose 4 times daily. 10/22/19   McGowen, Adrian Blackwater, MD  ?esomeprazole (NEXIUM) 40 MG capsule Take 1 capsule (40 mg total) by mouth daily. 07/01/21   McGowen, Adrian Blackwater, MD  ?FREESTYLE TEST STRIPS test strip USE TO CHECK GLUCOSE 4 TIMES DAILY. 05/05/21   McGowen, Adrian Blackwater, MD  ?furosemide (LASIX) 20 MG tablet TAKE 1 TABLET BY  MOUTH EVERY MORNING AS NEEDED FOR SWELLING. 04/01/21   McGowen, Adrian Blackwater, MD  ?insulin degludec (TRESIBA FLEXTOUCH) 100 UNIT/ML FlexTouch Pen Inject 66 Units into the skin 2 (two) times daily. INJECT 66 UNITS UNDER THE SKIN IN THE MORNING AND AFTERNOON. 06/05/21   McGowen, Adrian Blackwater, MD  ?Insulin Pen Needle (B-D ULTRAFINE III SHORT PEN) 31G X 8 MM MISC USE AS DIRECTED TWICE DAILY FOR MEDICATION INJECTION 06/04/21   McGowen, Adrian Blackwater, MD  ?lisinopril (ZESTRIL) 20 MG tablet TAKE 1 TABLET BY MOUTH EVERY DAY 05/18/21   McGowen, Adrian Blackwater, MD  ?methocarbamol (ROBAXIN) 500 MG tablet Take 500 mg by mouth 3 (three) times daily as needed. 10/05/19   [provider]  ?metoprolol tartrate (LOPRESSOR) 50 MG tablet Take 1 tablet (50 mg total) by mouth 2 (two) times daily. 07/01/21   McGowen, Adrian Blackwater, MD  ?morphine (MS CONTIN) 30 MG 12 hr tablet Take by mouth. 10/05/19   [provider]  ?Multiple Vitamin (MULTIVITAMIN) tablet Take 1 tablet by mouth daily.    [provider]  ?nitroGLYCERIN (NITROSTAT) 0.4 MG SL tablet Place 1 tablet (0.4 mg total) under the tongue every 5 (five) minutes as needed. ?Patient not taking: Reported on 07/01/2021 04/01/21   Tammi Sou, MD  ?venlafaxine XR (EFFEXOR-XR) 150 MG 24 hr capsule Take 1 capsule by mouth 2 (two) times  daily. 09/26/15   [provider]  ?   ? ?Allergies    ?Ace inhibitors, Atorvastatin, Prednisone, Septra [sulfamethoxazole-trimethoprim], and Metformin and related   ? ?Review of Systems   ?Review of Systems ? ?Physical Exam ?Updated Vital Signs ?BP (!) 189/79 (BP Location: Left Arm)   Pulse 95   Temp 98.1 ?F (36.7 ?C)   Resp 20   Ht _0  (1.854 m)   Wt (!) 147.3 kg   SpO2 96%   BMI 42.85 kg/m?  ?Physical Exam ?Vitals and nursing note reviewed.  ?Constitutional:   ?   General: He is not in acute distress. ?   Appearance: Normal appearance. He is not ill-appearing.  ?HENT:  ?   Head: Normocephalic and atraumatic.  ?Eyes:  ?   General: No  scleral icterus.    ?   Right eye: No discharge.     ?   Left eye: No discharge.  ?   Conjunctiva/sclera: Conjunctivae normal.  ?Pulmonary:  ?   Effort: Pulmonary effort is normal.  ?   Breath sounds: No stridor.  ?Musculoskeletal:  ?   Comments: Left lateral malleolus tenderness to palpation.  No bruising some scant swelling ? ?Left knee with small superficial nonbleeding abrasion ? ?Some diffuse tenderness of left knee.  Able to flex and extend ? ?DP PT pulses 2+ and symmetric feet are cold cap refill less than 3  ?Skin: ?   General: Skin is warm and dry.  ?Neurological:  ?   Mental Status: He is alert and oriented to person, place, and time. Mental status is at baseline.  ? ? ?ED Results / Procedures / Treatments   ?Labs ?(all labs ordered are listed, but only abnormal results are displayed) ?Labs Reviewed - No data to display ? ?EKG ?None ? ?Radiology ?DG Ankle Complete Left ? ?Result Date: 07/03/2021 ?CLINICAL DATA:  Left ankle pain after fall. EXAM: LEFT ANKLE COMPLETE - 3+ VIEW COMPARISON:  None. FINDINGS: There is no evidence of fracture, dislocation, or joint effusion. There is no evidence of arthropathy or other focal bone abnormality. Soft tissues are unremarkable. IMPRESSION: Negative. Electronically Signed   By: Marijo Conception M.D.   On: 07/03/2021 13:43  ? ?DG Knee Complete 4 Views Left ? ?Result Date: 07/03/2021 ?CLINICAL DATA:  Status post fall. EXAM: LEFT KNEE - COMPLETE 4+ VIEW COMPARISON:  None. FINDINGS: No acute fracture or dislocation identified. 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 No joint effusion noted. Soft tissues are unremarkable. IMPRESSION: 1. No acute findings. 2. Severe tricompartment osteoarthritis. Electronically Signed   By: Kerby Moors M.D.   On: 07/03/2021 13:43   ? ?Procedures ?Procedures  ? ? ?Medications Ordered in ED ?Medications - No data to display ? ?ED Course/ Medical Decision Making/ A&P ?  ?                        ?Medical Decision Making ?Amount and/or  Complexity of Data Reviewed ?Radiology: ordered. ? ? ?This patient presents to the ED for concern of fall, knee pain, ankle pain, this involves a number of treatment options, and is a complaint that carries with it a moderate to high risk of complications and morbidity.  The differential diagnosis includes intercranial hemorrhage, fracture, internal bleeding, concussion, dislocation ? ? ?Co morbidities: ?Discussed in HPI ? ? ?Brief History: ? ?Patient is a 56 year old gentleman presented emergency room today with complaints of left knee pain and left ankle pain.  He states  that earlier today he was walking on a wooden platform when his left foot broke with a wooden platform he was standing on it and he fell leftward states that he struck his left knee onto the ground knee cap first. ?He did not strike his head or lose consciousness.  He states that he is having left ankle and left knee pain. ? ?He has pain in general in his lower extremities although he also suffers from some peripheral neuropathy and states that he has somewhat decreased sensation in general. ? ?He also has had numerous spine surgeries and states that he generally has decree sensation from this and also some weakness.  He states he does not feel more weak than usual he states that his fall was because the piece of wood he was walking on broke.  He denies any new pain in his back or neck. ?Not on any anticoagulation ? ? ?Physical exam notable for some left ankle and left knee tenderness. ? ? ? ?EMR reviewed including pt PMHx, past surgical history and past visits to ER.  ? ?See HPI for more details ? ? ?Lab Tests: ? ? ? ? ? ?Imaging Studies: ? ?Abnormal findings. I personally reviewed all imaging studies. Imaging notable for ?Significant/severe tricompartmental arthritis.  No dislocation or fractures evident. ? ? ?Cardiac Monitoring: ? ?NA ?NA ? ? ?Medicines ordered: ? ?Offered analgesia which she declined ? ? ?Critical  Interventions: ? ? ? ? ?Consults/Attending Physician ? ? ? ? ? ?Reevaluation: ? ?After the interventions noted above I re-evaluated patient and found that they have :stayed the same ? ? ?Social Determinants of Health: ? ?The patient's social dete

## 2021-07-03 NOTE — Discharge Instructions (Addendum)
Please continue taking your prescribed pain medicine ?Please follow-up with your primary care provider and touch base with your neurosurgeon ?

## 2021-07-03 NOTE — ED Triage Notes (Addendum)
Walks with cane and was walking and fell on  left knee and  ankle painful, states hurts to walk  ?

## 2021-07-20 IMAGING — DX DG RIBS W/ CHEST 3+V*R*
3 series · 3 of 3 positions shown · non-contrast
Comparison: Chest radiographs 05/10/2016 and earlier.

CLINICAL DATA: 54-year-old male status post fall backwards last
night. Right rib pain.

EXAM:
RIGHT RIBS AND CHEST - 3+ VIEW

[chest pa]
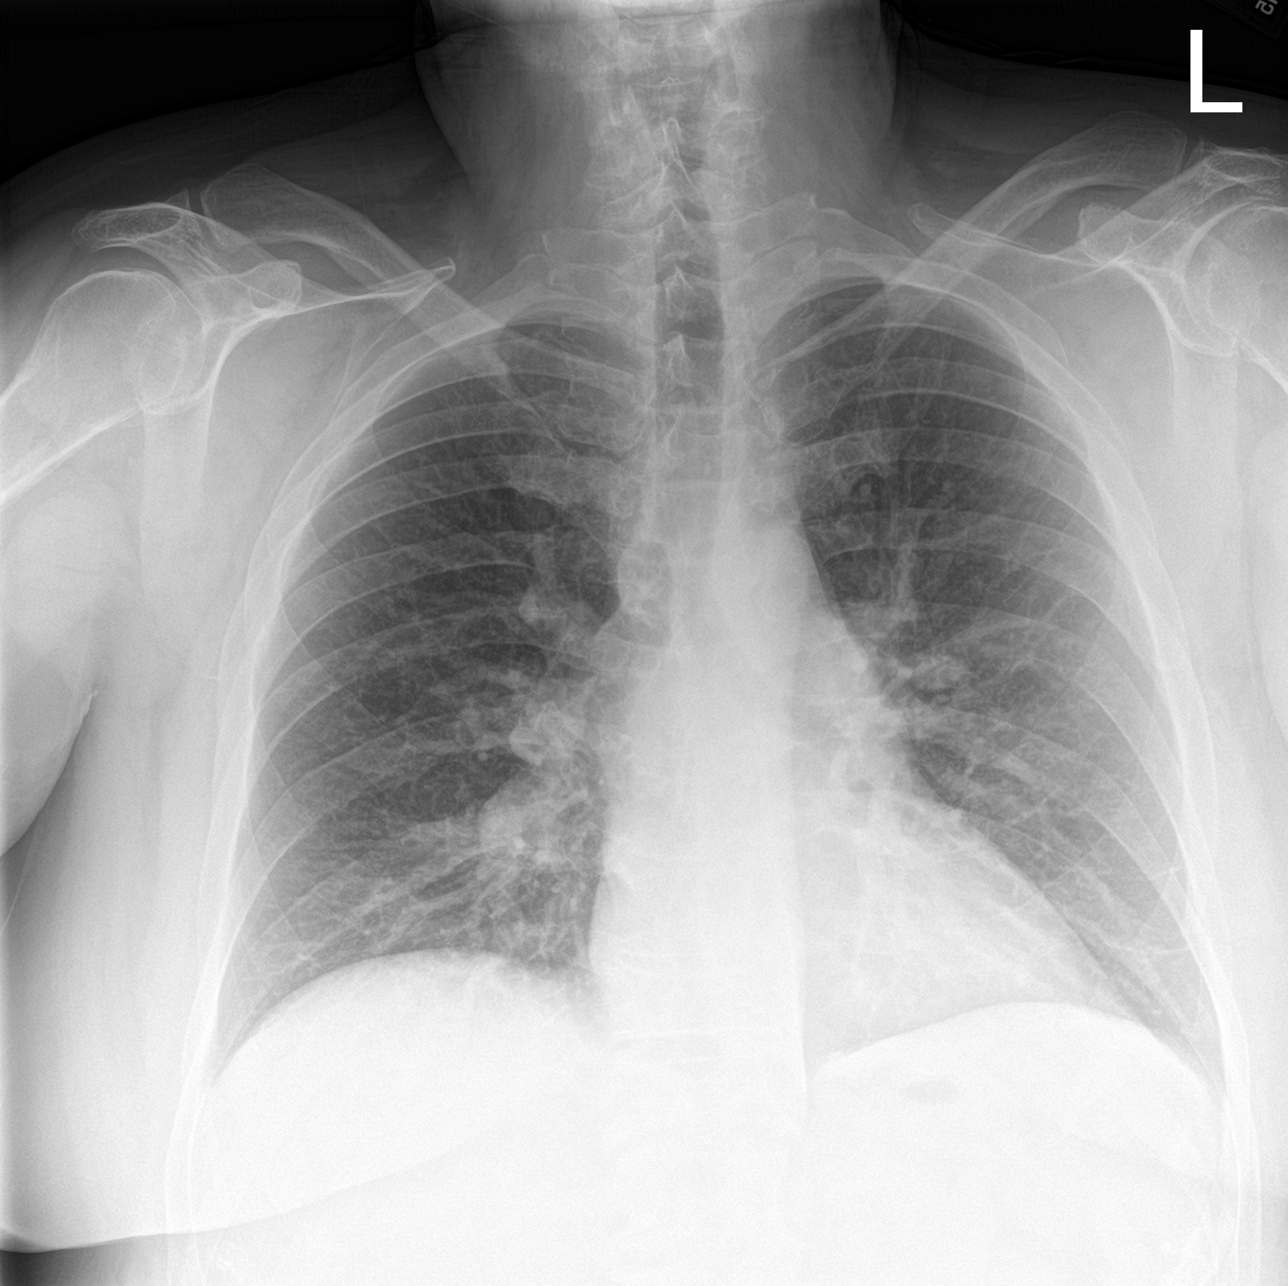

[rib pa]
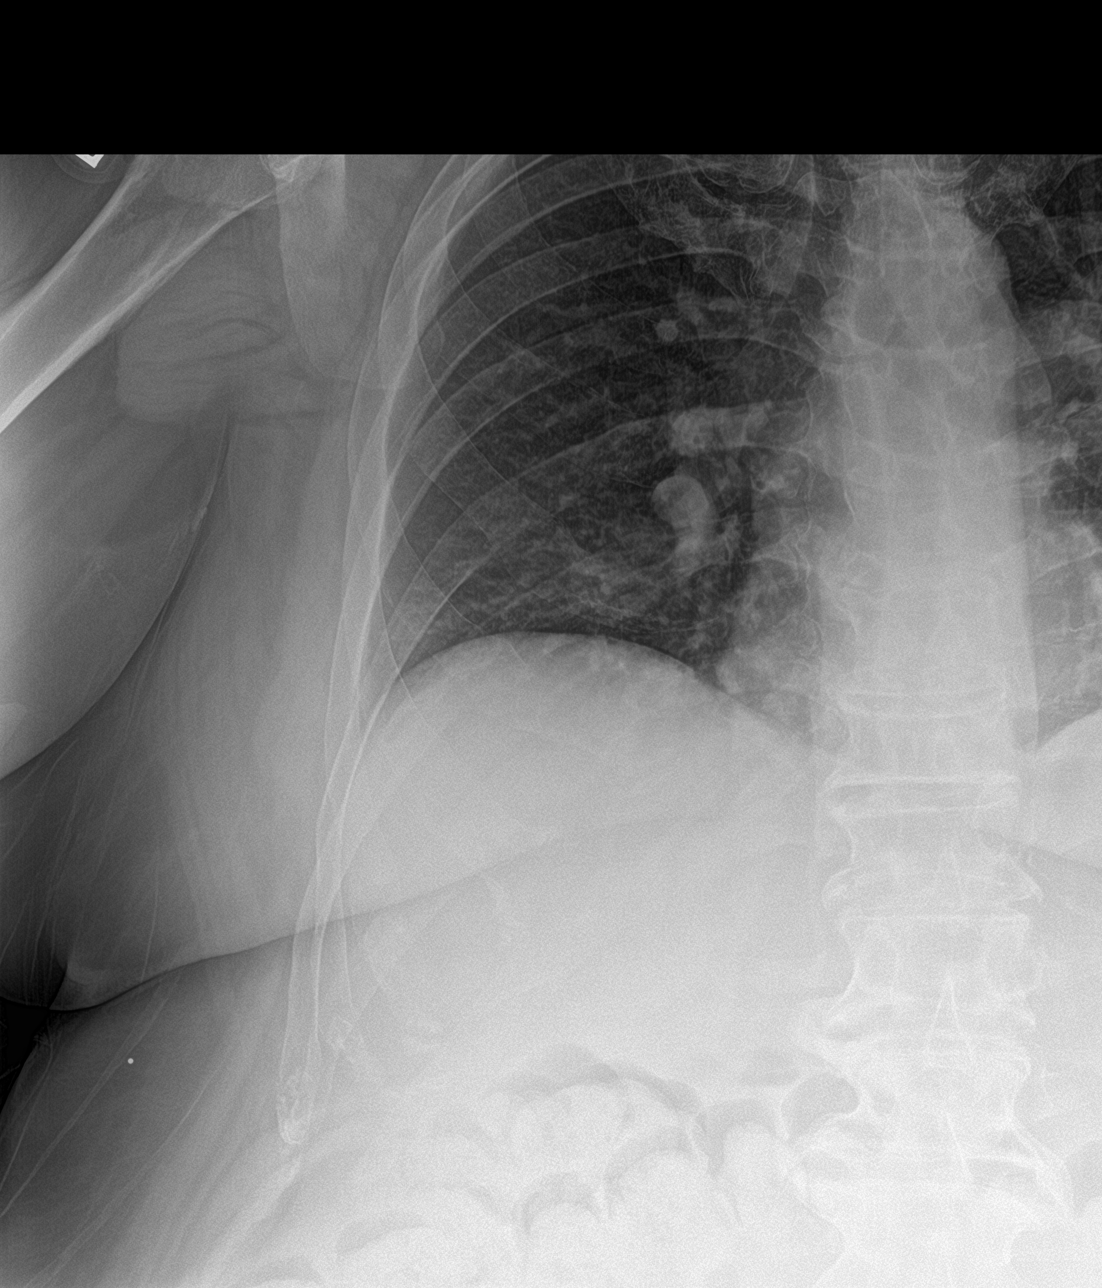

[rib pa obl]
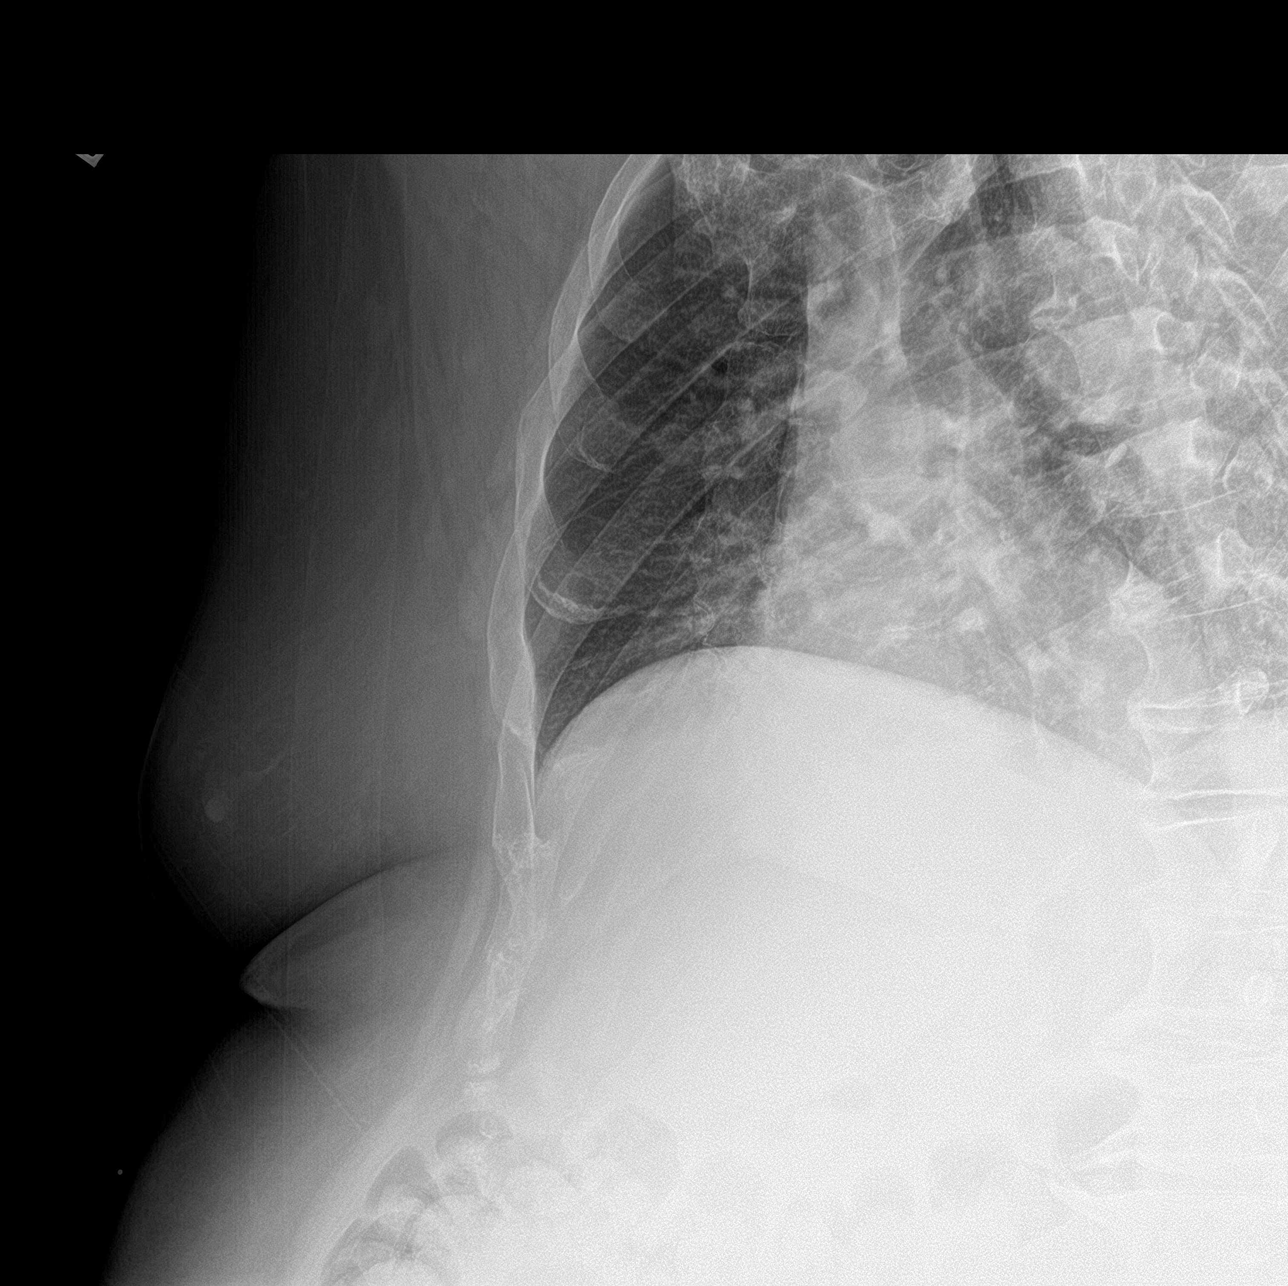

[3 of 3 positions shown; findings below may reference images not displayed]

FINDINGS: Slightly lower lung volumes. Mediastinal contours remain normal.
Visualized tracheal air column is within normal limits. Both lungs
appear stable and clear. No pneumothorax or pleural effusion.

Oblique views of the right ribs. Marked area of clinical concern
lateral to the right 10th rib. No right rib fracture is identified.
Other visible osseous structures appear intact, with widespread
spinal endplate spurring. Negative visible bowel gas pattern.
IMPRESSION: 1. No right rib fracture identified radiographically.
2. No acute cardiopulmonary abnormality.

## 2021-08-03 DIAGNOSIS — N35011 Post-traumatic bulbous urethral stricture: Secondary | ICD-10-CM | POA: Diagnosis not present

## 2021-08-03 DIAGNOSIS — N390 Urinary tract infection, site not specified: Secondary | ICD-10-CM | POA: Diagnosis not present

## 2021-08-05 ENCOUNTER — Other Ambulatory Visit: Payer: Self-pay | Admitting: Urology

## 2021-08-05 ENCOUNTER — Telehealth: Payer: Self-pay

## 2021-08-05 DIAGNOSIS — Z79891 Long term (current) use of opiate analgesic: Secondary | ICD-10-CM | POA: Diagnosis not present

## 2021-08-05 DIAGNOSIS — E1142 Type 2 diabetes mellitus with diabetic polyneuropathy: Secondary | ICD-10-CM | POA: Diagnosis not present

## 2021-08-05 DIAGNOSIS — M961 Postlaminectomy syndrome, not elsewhere classified: Secondary | ICD-10-CM | POA: Diagnosis not present

## 2021-08-05 DIAGNOSIS — G894 Chronic pain syndrome: Secondary | ICD-10-CM | POA: Diagnosis not present

## 2021-08-05 NOTE — Telephone Encounter (Signed)
Surgical clearance forms received on 5/3/3 from [facility]. Patient has been scheduled on N/A to surgical clearance appt. Forms have been placed on PCP desk for review. Pt was last seen 07/01/21 for [facility] follow up (bmet, a1c labs)  ? ? ?Please review and advise ? ?

## 2021-08-06 ENCOUNTER — Telehealth: Payer: Self-pay | Admitting: Family Medicine

## 2021-08-06 NOTE — Telephone Encounter (Signed)
Letter of medical clearance for cystoscopy with urethral dilatation--written and printed out today. ?

## 2021-08-06 NOTE — Telephone Encounter (Signed)
PCP completed letter and attached to surgical clearance. All forms faxed back ?

## 2021-08-07 ENCOUNTER — Other Ambulatory Visit: Payer: Self-pay | Admitting: Family Medicine

## 2021-08-17 NOTE — Patient Instructions (Addendum)
DUE TO COVID-19 ONLY TWO VISITORS  (aged 55 and older)  ARE ALLOWED TO COME WITH YOU AND STAY IN THE WAITING ROOM ONLY DURING PRE OP AND PROCEDURE.   ?**NO VISITORS ARE ALLOWED IN THE SHORT STAY AREA OR RECOVERY ROOM!!** ? ?IF YOU WILL BE ADMITTED INTO THE HOSPITAL YOU ARE ALLOWED ONLY FOUR SUPPORT PEOPLE DURING VISITATION HOURS ONLY (7 AM -8PM)   ?The support person(s) must pass our screening, gel in and out, and wear a mask at all times, including in the patient?s room. ?Patients must also wear a mask when staff or their support person are in the room. ?Visitors GUEST BADGE MUST BE WORN VISIBLY  ?One adult visitor may remain with you overnight and MUST be in the room by 8 P.M. ?  ? ? Your procedure is scheduled on: 08/21/21 ? ? Report to [facility] Main Entrance ? ?  Report to Short stay at : 5:15 AM ? ? Call this number if you have problems the morning of surgery [phone] ? ? Do not eat food :After Midnight. ? ? After Midnight you may have the following liquids until: 4:30 AM DAY OF SURGERY ? ?Water ?Black Coffee (sugar ok, NO MILK/CREAM OR CREAMERS)  ?Tea (sugar ok, NO MILK/CREAM OR CREAMERS) regular and decaf                             ?Plain Jell-O (NO RED)                                           ?Fruit ices (not with fruit pulp, NO RED)                                     ?Popsicles (NO RED)                                                                  ?Juice: apple, WHITE grape, WHITE cranberry ?Sports drinks like Gatorade (NO RED) ?Clear broth(vegetable,chicken,beef) ?        ?FOLLOW BOWEL PREP AND ANY ADDITIONAL PRE OP INSTRUCTIONS YOU RECEIVED FROM YOUR SURGEON'S OFFICE!!! ?  ?Oral Hygiene is also important to reduce your risk of infection.                                    ?Remember - BRUSH YOUR TEETH THE MORNING OF SURGERY WITH YOUR REGULAR TOOTHPASTE ? ? Do NOT smoke after Midnight ? ? Take these medicines the morning of surgery with A SIP OF WATER:  venlafaxine,metoprolol,Nexium.Tylenol as needed. ? ?DO NOT TAKE ANY ORAL DIABETIC MEDICATIONS DAY OF YOUR SURGERY ?How to Manage Your Diabetes ?Before and After Surgery ? ?Why is it important to control my blood sugar before and after surgery? ?Improving blood sugar levels before and after surgery helps healing and can limit problems. ?A way of improving blood sugar control is eating a healthy diet by: ? Eating less sugar and carbohydrates ?  Increasing activity/exercise ? Talking with your doctor about reaching your blood sugar goals ?High blood sugars (greater than 180 mg/dL) can raise your risk of infections and slow your recovery, so you will need to focus on controlling your diabetes during the weeks before surgery. ?Make sure that the doctor who takes care of your diabetes knows about your planned surgery including the date and location. ? ?How do I manage my blood sugar before surgery? ?Check your blood sugar at least 4 times a day, starting 2 days before surgery, to make sure that the level is not too high or low. ?Check your blood sugar the morning of your surgery when you wake up and every 2 hours until you get to the Short Stay unit. ?If your blood sugar is less than 70 mg/dL, you will need to treat for low blood sugar: ?Do not take insulin. ?Treat a low blood sugar (less than 70 mg/dL) with ? cup of clear juice (cranberry or apple), 4 glucose tablets, OR glucose gel. ?Recheck blood sugar in 15 minutes after treatment (to make sure it is greater than 70 mg/dL). If your blood sugar is not greater than 70 mg/dL on recheck, call [phone] for further instructions. ?Report your blood sugar to the short stay nurse when you get to Short Stay. ? ?If you are admitted to the hospital after surgery: ?Your blood sugar will be checked by the staff and you will probably be given insulin after surgery (instead of oral diabetes medicines) to make sure you have good blood sugar levels. ?The goal for blood sugar control  after surgery is 80-180 mg/dL. ? ? ?WHAT DO I DO ABOUT MY DIABETES MEDICATION? ? ?Do not take oral diabetes medicines (pills) the morning of surgery. ? ?THE NIGHT BEFORE SURGERY, take ONLY half of the insulin dose.     ? ?THE MORNING OF SURGERY, take ONLY half of the insulin dose. ? ?Bring CPAP mask and tubing day of surgery. ?                  ?           You may not have any metal on your body including hair pins, jewelry, and body piercing ? ?           Do not wear lotions, powders, perfumes/cologne, or deodorant ? ?            Men may shave face and neck. ? ? Do not bring valuables to the hospital. [facility] NOT ?            RESPONSIBLE   FOR VALUABLES. ? ? Contacts, dentures or bridgework may not be worn into surgery. ? ? Bring small overnight bag day of surgery. ?  ? Patients discharged on the day of surgery will not be allowed to drive home.  Someone NEEDS to stay with you for the first 24 hours after anesthesia. ? ? Special Instructions: Bring a copy of your healthcare power of attorney and living will documents         the day of surgery if you haven't scanned them before. ? ?            Please read over the following fact sheets you were given: IF [place] [phone] ? ?   [place] - Preparing for Surgery ?Before surgery, you can play an important role.  Because skin is not sterile, your skin needs to be  as free of germs as possible.  You can reduce the number of germs on your skin by washing with CHG (chlorahexidine gluconate) soap before surgery.  CHG is an antiseptic cleaner which kills germs and bonds with the skin to continue killing germs even after washing. ?Please DO NOT use if you have an allergy to CHG or antibacterial soaps.  If your skin becomes reddened/irritated stop using the CHG and inform your nurse when you arrive at Short Stay. ?Do not shave (including legs and underarms) for at least 48 hours prior to the first CHG shower.   You may shave your face/neck. ?Please follow these instructions carefully: ? 1.  Shower with CHG Soap the night before surgery and the  morning of Surgery. ? 2.  If you choose to wash your hair, wash your hair first as usual with your  normal  shampoo. ? 3.  After you shampoo, rinse your hair and body thoroughly to remove the  shampoo.                           4.  Use CHG as you would any other liquid soap.  You can apply chg directly  to the skin and wash  ?                     Gently with a scrungie or clean washcloth. ? 5.  Apply the CHG Soap to your body ONLY FROM THE NECK DOWN.   Do not use on face/ open      ?                     Wound or open sores. Avoid contact with eyes, ears mouth and genitals (private parts).  ?                     Production manager,  Genitals (private parts) with your normal soap. ?            6.  Wash thoroughly, paying special attention to the area where your surgery  will be performed. ? 7.  Thoroughly rinse your body with warm water from the neck down. ? 8.  DO NOT shower/wash with your normal soap after using and rinsing off  the CHG Soap. ?               9.  Pat yourself dry with a clean towel. ?           10.  Wear clean pajamas. ?           11.  Place clean sheets on your bed the night of your first shower and do not  sleep with pets. ?Day of Surgery : ?Do not apply any lotions/deodorants the morning of surgery.  Please wear clean clothes to the hospital/surgery center. ? ?FAILURE TO FOLLOW THESE INSTRUCTIONS MAY RESULT IN THE CANCELLATION OF YOUR SURGERY ?PATIENT SIGNATURE_________________________________ ? ?NURSE SIGNATURE__________________________________ ? ?________________________________________________________________________  ?

## 2021-08-19 ENCOUNTER — Encounter (HOSPITAL_COMMUNITY): Payer: Self-pay

## 2021-08-19 ENCOUNTER — Encounter (HOSPITAL_COMMUNITY)
Admission: RE | Admit: 2021-08-19 | Discharge: 2021-08-19 | Disposition: A | Discharge status: Home / Self Care | Payer: HMO | Source: Ambulatory Visit | Attending: Urology | Admitting: Urology

## 2021-08-19 VITALS — BP 169/81 | HR 76 | Temp 98.1°F | Ht 73.0 in | Wt 312.0 lb

## 2021-08-19 DIAGNOSIS — Z01818 Encounter for other preprocedural examination: Secondary | ICD-10-CM | POA: Insufficient documentation

## 2021-08-19 DIAGNOSIS — I10 Essential (primary) hypertension: Secondary | ICD-10-CM | POA: Diagnosis not present

## 2021-08-19 DIAGNOSIS — E118 Type 2 diabetes mellitus with unspecified complications: Secondary | ICD-10-CM

## 2021-08-19 LAB — CBC
HCT: 48.4 % (ref 39.0–52.0)
Hemoglobin: 15.1 g/dL (ref 13.0–17.0)
MCH: 27 pg (ref 26.0–34.0)
MCHC: 31.2 g/dL (ref 30.0–36.0)
MCV: 86.4 fL (ref 80.0–100.0)
Platelets: 380 10*3/uL (ref 150–400)
RBC: 5.6 MIL/uL (ref 4.22–5.81)
RDW: 12.7 % (ref 11.5–15.5)
WBC: 14.6 10*3/uL — ABNORMAL HIGH (ref 4.0–10.5)
nRBC: 0 % (ref 0.0–0.2)

## 2021-08-19 LAB — BASIC METABOLIC PANEL
Anion gap: 6 (ref 5–15)
BUN: 16 mg/dL (ref 6–20)
CO2: 30 mmol/L (ref 22–32)
Calcium: 8.9 mg/dL (ref 8.9–10.3)
Chloride: 103 mmol/L (ref 98–111)
Creatinine, Ser: 0.79 mg/dL (ref 0.61–1.24)
GFR, Estimated: >60 mL/min (ref >60)
Glucose, Bld: 184 mg/dL — ABNORMAL HIGH (ref 70–99)
Potassium: 4.5 mmol/L (ref 3.5–5.1)
Sodium: 139 mmol/L (ref 135–145)

## 2021-08-19 NOTE — Progress Notes (Addendum)
For Short Stay: ?[place] appointment date: ?Date of COVID positive in last 90 days: ? ?Bowel Prep reminder: ? ? ?For Anesthesia: ?PCP - Dr. Shawnie Dapper ?Cardiologist -  ? ?Chest x-ray -  ?EKG -  ?Stress Test -  ?ECHO -  ?Cardiac Cath -  ?Pacemaker/ICD device last checked: ?Pacemaker orders received: ?Device Rep notified: ? ?Spinal Cord Stimulator: ? ?Sleep Study -  ?CPAP -  ? ?Fasting Blood Sugar - 130's ?Checks Blood Sugar __3___ times a day ?Date and result of last Hgb A1c- 10.8: 07/01/21 ? ?Blood Thinner Instructions: ?Aspirin Instructions: ?Last Dose: ? ?Activity level: Can go up a flight of stairs and activities of daily living without stopping and without chest pain and/or shortness of breath ?  Able to exercise without chest pain and/or shortness of breath ?  Unable to go up a flight of stairs without chest pain and/or shortness of breath ?   ? ?Anesthesia review: Hx: Heart murmur,Angina,OSA(NO CPAP),seizure. ? ?Patient denies shortness of breath, fever, cough and chest pain at PAT appointment ? ? ?Patient verbalized understanding of instructions that were given to them at the PAT appointment. Patient was also instructed that they will need to review over the PAT instructions again at home before surgery.  ?

## 2021-08-20 NOTE — H&P (Signed)
56 year old white male presents today for evaluation of weak stream. The patient has a history of a bulbar urethral stricture. He was last seen and treated for that in 2004 with a DVIU. Since then the patient did well up until about a year ago. Over the course of the last year his stream has really slowed down. He mostly has a dribble at this point. He is not sure if he empties his bladder, but he thinks there are times when he does not. He has not had any infections. He denies any dysuria or gross hematuria. He has not been treated for any recurrent urinary tract infections.??? CC: AUA Questions Scoring. HPI: Dylan Owen is a 56 year-old male patient who was referred by Dr. Shawnie Dapper, MD who is here AUA Questions.???? AUA Symptom Score: Less than 50% of the time he has the sensation of not emptying his bladder completely when finished urinating. More than 50% of the time he has to urinate again fewer than two hours after he has finished urinating. Almost always he has to start and stop again several times when he urinates. 50% of the time he finds it difficult to postpone urination. Almost always he has a weak urinary stream. Almost always he has to push or strain to begin urination. He has to get up to urinate 3 times from the time he goes to bed until the time he gets up in the morning. ??Calculated AUA Symptom Score: 27?? ALLERGIES: No Allergies   MEDICATIONS: No Medications   GU PSH: No GU PSH   NON-GU PSH: No Non-GU PSH   ? GU PMH: No GU PMH  NON-GU PMH: No Non-GU PMH  FAMILY HISTORY: No Family History  SOCIAL HISTORY: No Social History  REVIEW OF SYSTEMS:  GU Review Male:  Patient denies frequent urination, hard to postpone urination, burning/ pain with urination, get up at night to urinate, leakage of urine, stream starts and stops, trouble starting your stream, have to strain to urinate , erection problems, and penile pain. Gastrointestinal (Upper):  Patient denies  nausea, vomiting, and indigestion/ heartburn. Gastrointestinal (Lower):  Patient denies diarrhea and constipation. Constitutional:  Patient denies fever, night sweats, weight loss, and fatigue. Skin:  Patient denies skin rash/ lesion and itching. Eyes:  Patient denies blurred vision and double vision. Ears/ Nose/ Throat:  Patient denies sinus problems and sore throat. Hematologic/Lymphatic:  Patient denies swollen glands and easy bruising. Cardiovascular:  Patient denies leg swelling and chest pains. Respiratory:  Patient denies cough and shortness of breath. Endocrine:  Patient denies excessive thirst. Musculoskeletal:  Patient denies back pain and joint pain. Neurological:  Patient denies headaches and dizziness. Psychologic:  Patient denies depression and anxiety. VITAL SIGNS:    08/03/2021 12:55 PM  Weight 319 lb / 144.7 kg BP 184/98 mmHg Pulse 105 /min Temperature 97.5 F / 36.3 C GU PHYSICAL EXAMINATION:  Scrotum: No lesions. No edema. No cysts. No warts. Epididymides: Right: no spermatocele, no masses, no cysts, no tenderness, no induration, no enlargement. Left: no spermatocele, no masses, no cysts, no tenderness, no induration, no enlargement. Testes: No tenderness, no swelling, no enlargement left testes. No tenderness, no swelling, no enlargement right testes. Normal location left testes. Normal location right testes. No mass, no cyst, no varicocele, no hydrocele left testes. No mass, no cyst, no varicocele, no hydrocele right testes. Urethral Meatus: Normal size. No lesion, no wart, no discharge, no polyp. Normal location. Penis: Circumcised, no foreskin warts, no cracks. buried Seminal Vesicles: Nonpalpable.  MULTI-SYSTEM PHYSICAL EXAMINATION:  Constitutional: Well-nourished. No physical deformities. Normally developed. Good grooming. Neck: Neck symmetrical, not swollen. Normal tracheal position. Respiratory: No labored breathing, no use of  accessory muscles.  Cardiovascular: Normal temperature, normal extremity pulses, no swelling, no varicosities. Lymphatic: No enlargement of neck, axillae, groin. Skin: No paleness, no jaundice, no cyanosis. No lesion, no ulcer, no rash. Neurologic / Psychiatric: Oriented to time, oriented to place, oriented to person. No depression, no anxiety, no agitation. Gastrointestinal: No mass, no tenderness, no rigidity, non obese abdomen. Eyes: Normal conjunctivae. Normal eyelids. Ears, Nose, Mouth, and Throat: Left ear no scars, no lesions, no masses. Right ear no scars, no lesions, no masses. Nose no scars, no lesions, no masses. Normal hearing. Normal lips. Musculoskeletal: Normal gait and station of head and neck. ?  Complexity of Data:  Source Of History:  Patient Records Review:  Previous Doctor Records, Previous Patient Records, POC Tool Urine Test Review:  Urinalysis  PROCEDURES:   Flexible Cystoscopy - 52000 Risks, benefits, and some of the potential complications of the procedure were discussed at length with the patient including infection, bleeding, voiding discomfort, urinary retention, fever, chills, sepsis, and others. All questions were answered. Informed consent was obtained. Sterile technique and intraurethral analgesia were used. Meatus:  Normal size. Normal location. Normal condition. Urethra:  Mild penile stricture. Moderate bulbous stricture. External Sphincter:  Normal. Verumontanum:  Normal.   The lower urinary tract was carefully examined. The procedure was well-tolerated and without complications. Antibiotic instructions were given. Instructions were given to call the office immediately for bloody urine, difficulty urinating, urinary retention, painful or frequent urination, fever, chills, nausea, vomiting or other illness. The patient stated that he understood these instructions and would comply with them. ? PVR Ultrasound - 74259 Scanned Volume: 0  cc ? Urinalysis w/Scope  Dipstick Dipstick Cont'd Micro Color: Yellow Bilirubin: Neg mg/dL WBC/hpf: 0 - 5/hpf Appearance: Clear Ketones: Neg mg/dL RBC/hpf: NS (Not Seen) Specific Gravity: 1.025 Blood: Neg ery/uL Bacteria: Few (10-25/hpf) pH: 5.5 Protein: 3+ mg/dL Cystals: NS (Not Seen) Glucose: Neg mg/dL Urobilinogen: 0.2 mg/dL Casts: Hyaline  Nitrites: Neg Trichomonas: Not Present  Leukocyte Esterase: Neg leu/uL Mucous: Present   Epithelial Cells: 0 - 5/hpf   Yeast: NS (Not Seen)   Sperm: Not Present ? ASSESSMENT:    ICD-10 Details 1 GU:  Bulbar urethral stricture - N35.011   PLAN:  Orders  Labs Urine Culture Schedule  Return Visit/Planned Activity: ASAP - Schedule Surgery Document  Letter(s):  Created for Patient: Clinical Summary  Notes:  The patient has a bulbar urethral stricture that is slightly bigger than pinhole. There is also a film of tissue in the distal bulbous urethra that I was able to navigate through. I spoke to the patient about the management strategies. We spoke about doing balloon dilation using the UroLume. I think this would be the least traumatic and have the best chance of not recurring. We also discussed urethroplasty which he was unwilling to do. We will try to get him scheduled for urethral balloon dilation in the coming weeks. ?

## 2021-08-21 ENCOUNTER — Ambulatory Visit (HOSPITAL_COMMUNITY)
Admission: RE | Admit: 2021-08-21 | Discharge: 2021-08-21 | Disposition: A | Discharge status: Home / Self Care | Payer: HMO | Attending: Urology | Admitting: Urology

## 2021-08-21 ENCOUNTER — Encounter (HOSPITAL_COMMUNITY): Payer: Self-pay | Admitting: Urology

## 2021-08-21 ENCOUNTER — Encounter (HOSPITAL_COMMUNITY)
Admission: RE | Disposition: A | Discharge status: Home / Self Care | Payer: Self-pay | Source: Home / Self Care | Attending: Urology

## 2021-08-21 ENCOUNTER — Ambulatory Visit (HOSPITAL_COMMUNITY): Payer: HMO | Admitting: Physician Assistant

## 2021-08-21 ENCOUNTER — Ambulatory Visit (HOSPITAL_BASED_OUTPATIENT_CLINIC_OR_DEPARTMENT_OTHER): Payer: HMO | Admitting: Certified Registered"

## 2021-08-21 ENCOUNTER — Ambulatory Visit (HOSPITAL_COMMUNITY): Payer: HMO

## 2021-08-21 DIAGNOSIS — N35919 Unspecified urethral stricture, male, unspecified site: Secondary | ICD-10-CM | POA: Diagnosis not present

## 2021-08-21 DIAGNOSIS — R3912 Poor urinary stream: Secondary | ICD-10-CM | POA: Insufficient documentation

## 2021-08-21 DIAGNOSIS — I209 Angina pectoris, unspecified: Secondary | ICD-10-CM | POA: Diagnosis not present

## 2021-08-21 DIAGNOSIS — N35816 Other urethral stricture, male, overlapping sites: Secondary | ICD-10-CM | POA: Diagnosis not present

## 2021-08-21 DIAGNOSIS — E119 Type 2 diabetes mellitus without complications: Secondary | ICD-10-CM | POA: Diagnosis not present

## 2021-08-21 DIAGNOSIS — N35912 Unspecified bulbous urethral stricture, male: Secondary | ICD-10-CM

## 2021-08-21 DIAGNOSIS — I10 Essential (primary) hypertension: Secondary | ICD-10-CM

## 2021-08-21 DIAGNOSIS — E039 Hypothyroidism, unspecified: Secondary | ICD-10-CM | POA: Diagnosis not present

## 2021-08-21 DIAGNOSIS — N99116 Postprocedural urethral stricture, male, overlapping sites: Secondary | ICD-10-CM

## 2021-08-21 HISTORY — PX: CYSTOSCOPY WITH URETHRAL DILATATION: SHX5125

## 2021-08-21 HISTORY — PX: CYSTOSCOPY WITH RETROGRADE URETHROGRAM: SHX6309

## 2021-08-21 LAB — GLUCOSE, CAPILLARY
Glucose-Capillary: 65 mg/dL — ABNORMAL LOW (ref 70–99)
Glucose-Capillary: 136 mg/dL — ABNORMAL HIGH (ref 70–99)
Glucose-Capillary: 110 mg/dL — ABNORMAL HIGH (ref 70–99)

## 2021-08-21 SURGERY — CYSTOSCOPY WITH RETROGRADE URETHROGRAM
Anesthesia: General

## 2021-08-21 MED ORDER — HYDROMORPHONE HCL 1 MG/ML IJ SOLN
0.2500 mg | INTRAMUSCULAR | Status: DC | PRN
  Administered 2021-08-21: 0.5 mg via INTRAVENOUS

## 2021-08-21 MED ORDER — PROPOFOL 10 MG/ML IV BOLUS
INTRAVENOUS | Status: AC
  Filled 2021-08-21: qty 20

## 2021-08-21 MED ORDER — FENTANYL CITRATE (PF) 100 MCG/2ML IJ SOLN
INTRAMUSCULAR | Status: DC | PRN
  Administered 2021-08-21: 25 ug via INTRAVENOUS
  Administered 2021-08-21: 50 ug via INTRAVENOUS
  Administered 2021-08-21: 25 ug via INTRAVENOUS

## 2021-08-21 MED ORDER — LABETALOL HCL 5 MG/ML IV SOLN
INTRAVENOUS | Status: AC
  Administered 2021-08-21: 10 mg
  Filled 2021-08-21: qty 4

## 2021-08-21 MED ORDER — PROPOFOL 10 MG/ML IV BOLUS
INTRAVENOUS | Status: DC | PRN
  Administered 2021-08-21: 50 mg via INTRAVENOUS
  Administered 2021-08-21: 180 mg via INTRAVENOUS

## 2021-08-21 MED ORDER — PHENYLEPHRINE HCL (PRESSORS) 10 MG/ML IV SOLN
INTRAVENOUS | Status: AC
  Filled 2021-08-21: qty 1

## 2021-08-21 MED ORDER — MIDAZOLAM HCL 2 MG/2ML IJ SOLN
INTRAMUSCULAR | Status: AC
  Filled 2021-08-21: qty 2

## 2021-08-21 MED ORDER — FENTANYL CITRATE (PF) 100 MCG/2ML IJ SOLN
INTRAMUSCULAR | Status: AC
  Filled 2021-08-21: qty 2

## 2021-08-21 MED ORDER — IOHEXOL 300 MG/ML  SOLN
INTRAMUSCULAR | Status: DC | PRN
  Administered 2021-08-21: 20 mL

## 2021-08-21 MED ORDER — AMISULPRIDE (ANTIEMETIC) 5 MG/2ML IV SOLN: 10.0000 mg | Freq: Once | INTRAVENOUS | Status: DC | PRN

## 2021-08-21 MED ORDER — DEXAMETHASONE SODIUM PHOSPHATE 10 MG/ML IJ SOLN
INTRAMUSCULAR | Status: AC
  Filled 2021-08-21: qty 1

## 2021-08-21 MED ORDER — DIATRIZOATE MEGLUMINE 30 % UR SOLN
URETHRAL | Status: AC
  Filled 2021-08-21: qty 100

## 2021-08-21 MED ORDER — EPHEDRINE 5 MG/ML INJ
INTRAVENOUS | Status: AC
  Filled 2021-08-21: qty 15

## 2021-08-21 MED ORDER — HYDROMORPHONE HCL 1 MG/ML IJ SOLN
INTRAMUSCULAR | Status: AC
  Filled 2021-08-21: qty 1

## 2021-08-21 MED ORDER — MIDAZOLAM HCL 2 MG/2ML IJ SOLN
INTRAMUSCULAR | Status: DC | PRN
Start: 2021-08-21 — End: 2021-08-21
  Administered 2021-08-21 (×2): 1 mg via INTRAVENOUS

## 2021-08-21 MED ORDER — DEXAMETHASONE SODIUM PHOSPHATE 10 MG/ML IJ SOLN
INTRAMUSCULAR | Status: DC | PRN
  Administered 2021-08-21: 4 mg via INTRAVENOUS

## 2021-08-21 MED ORDER — OXYCODONE HCL 5 MG PO TABS: 5.0000 mg | ORAL_TABLET | Freq: Once | ORAL | Status: DC | PRN

## 2021-08-21 MED ORDER — ONDANSETRON HCL 4 MG/2ML IJ SOLN
INTRAMUSCULAR | Status: AC
  Filled 2021-08-21: qty 2

## 2021-08-21 MED ORDER — CHLORHEXIDINE GLUCONATE 0.12 % MT SOLN
15.0000 mL | Freq: Once | OROMUCOSAL | Status: AC
  Administered 2021-08-21: 15 mL via OROMUCOSAL

## 2021-08-21 MED ORDER — STERILE WATER FOR IRRIGATION IR SOLN
Status: DC | PRN
  Administered 2021-08-21: 3000 mL

## 2021-08-21 MED ORDER — DEXTROSE 50 % IV SOLN
INTRAVENOUS | Status: AC
  Filled 2021-08-21: qty 50

## 2021-08-21 MED ORDER — EPHEDRINE SULFATE-NACL 50-0.9 MG/10ML-% IV SOSY
PREFILLED_SYRINGE | INTRAVENOUS | Status: DC | PRN
  Administered 2021-08-21: 10 mg via INTRAVENOUS
  Administered 2021-08-21 (×2): 5 mg via INTRAVENOUS
  Administered 2021-08-21 (×3): 10 mg via INTRAVENOUS

## 2021-08-21 MED ORDER — PHENYLEPHRINE 80 MCG/ML (10ML) SYRINGE FOR IV PUSH (FOR BLOOD PRESSURE SUPPORT)
PREFILLED_SYRINGE | INTRAVENOUS | Status: DC | PRN
Start: 2021-08-21 — End: 2021-08-21
  Administered 2021-08-21 (×2): 80 ug via INTRAVENOUS
  Administered 2021-08-21: 120 ug via INTRAVENOUS
  Administered 2021-08-21: 80 ug via INTRAVENOUS
  Administered 2021-08-21 (×2): 120 ug via INTRAVENOUS
  Administered 2021-08-21: 40 ug via INTRAVENOUS

## 2021-08-21 MED ORDER — OXYCODONE HCL 5 MG/5ML PO SOLN: 5.0000 mg | Freq: Once | ORAL | Status: DC | PRN

## 2021-08-21 MED ORDER — ONDANSETRON HCL 4 MG/2ML IJ SOLN
INTRAMUSCULAR | Status: DC | PRN
  Administered 2021-08-21: 4 mg via INTRAVENOUS

## 2021-08-21 MED ORDER — LACTATED RINGERS IV SOLN
INTRAVENOUS | Status: DC
Start: 2021-08-21 — End: 2021-08-21

## 2021-08-21 MED ORDER — ORAL CARE MOUTH RINSE: 15.0000 mL | Freq: Once | OROMUCOSAL | Status: AC

## 2021-08-21 MED ORDER — LIDOCAINE 2% (20 MG/ML) 5 ML SYRINGE
INTRAMUSCULAR | Status: DC | PRN
Start: 2021-08-21 — End: 2021-08-21
  Administered 2021-08-21: 40 mg via INTRAVENOUS

## 2021-08-21 MED ORDER — DEXTROSE 50 % IV SOLN
12.5000 g | INTRAVENOUS | Status: AC
  Administered 2021-08-21: 12.5 g via INTRAVENOUS

## 2021-08-21 MED ORDER — CIPROFLOXACIN IN D5W 400 MG/200ML IV SOLN
400.0000 mg | INTRAVENOUS | Status: AC
  Administered 2021-08-21: 400 mg via INTRAVENOUS
  Filled 2021-08-21: qty 200

## 2021-08-21 MED ORDER — PROMETHAZINE HCL 25 MG/ML IJ SOLN: 6.2500 mg | INTRAMUSCULAR | Status: DC | PRN

## 2021-08-21 MED ORDER — LABETALOL HCL 5 MG/ML IV SOLN
10.0000 mg | Freq: Once | INTRAVENOUS | Status: DC
Start: 2021-08-21 — End: 2021-08-21

## 2021-08-21 MED ORDER — IOHEXOL 300 MG/ML  SOLN
INTRAMUSCULAR | Status: DC | PRN
  Administered 2021-08-21: 100 mL

## 2021-08-21 MED ORDER — PHENYLEPHRINE 80 MCG/ML (10ML) SYRINGE FOR IV PUSH (FOR BLOOD PRESSURE SUPPORT)
PREFILLED_SYRINGE | INTRAVENOUS | Status: AC
  Filled 2021-08-21: qty 10

## 2021-08-21 MED ORDER — MEPERIDINE HCL 50 MG/ML IJ SOLN: 6.2500 mg | INTRAMUSCULAR | Status: DC | PRN

## 2021-08-21 SURGICAL SUPPLY — 24 items
BAG DRN RND TRDRP ANRFLXCHMBR (UROLOGICAL SUPPLIES)
BAG URINE DRAIN 2000ML AR STRL (UROLOGICAL SUPPLIES) ×2 IMPLANT
BALLN NEPHROSTOMY (BALLOONS)
BALLN OPTILUME DCB 30X5X75 (BALLOONS) ×4
BALLOON NEPHROSTOMY (BALLOONS) IMPLANT
BALLOON OPTILUME DCB 30X5X75 (BALLOONS) IMPLANT
CATH FOLEY 2W COUNCIL 20FR 5CC (CATHETERS) IMPLANT
CATH ROBINSON RED A/P 14FR (CATHETERS) ×2 IMPLANT
CATH URET 5FR 28IN CONE TIP (BALLOONS)
CATH URET 5FR 70CM CONE TIP (BALLOONS) IMPLANT
CATH URETL OPEN END 6FR 70 (CATHETERS) IMPLANT
CLOTH BEACON ORANGE TIMEOUT ST (SAFETY) ×3 IMPLANT
GLOVE BIO SURGEON STRL SZ7.5 (GLOVE) ×3 IMPLANT
GOWN STRL REUS W/ TWL XL LVL3 (GOWN DISPOSABLE) ×2 IMPLANT
GOWN STRL REUS W/TWL XL LVL3 (GOWN DISPOSABLE) ×2
GUIDEWIRE ANG ZIPWIRE 038X150 (WIRE) IMPLANT
GUIDEWIRE STR DUAL SENSOR (WIRE) ×3 IMPLANT
MANIFOLD NEPTUNE II (INSTRUMENTS) IMPLANT
NS IRRIG 1000ML POUR BTL (IV SOLUTION) IMPLANT
PACK CYSTO (CUSTOM PROCEDURE TRAY) ×3 IMPLANT
PENCIL SMOKE EVACUATOR (MISCELLANEOUS) IMPLANT
TRAY FOL W/BAG SLVR 16FR STRL (SET/KITS/TRAYS/PACK) IMPLANT
TRAY FOLEY W/BAG SLVR 16FR LF (SET/KITS/TRAYS/PACK) ×2
WATER STERILE IRR 3000ML UROMA (IV SOLUTION) ×3 IMPLANT

## 2021-08-21 NOTE — Anesthesia Procedure Notes (Signed)
Procedure Name: LMA Insertion Date/Time: 08/21/2021 7:31 AM Performed by: Eben Burow, CRNA Pre-anesthesia Checklist: Patient identified, Emergency Drugs available, Suction available, Patient being monitored and Timeout performed Patient Re-evaluated:Patient Re-evaluated prior to induction Oxygen Delivery Method: Circle system utilized Preoxygenation: Pre-oxygenation with 100% oxygen Induction Type: IV induction Ventilation: Mask ventilation without difficulty LMA: LMA inserted LMA Size: 5.0 Number of attempts: 1 Tube secured with: Tape Dental Injury: Teeth and Oropharynx as per pre-operative assessment

## 2021-08-21 NOTE — Op Note (Signed)
Preoperative diagnosis:  Bulbar urethral stricture  Postoperative diagnosis:  Bulbar and anterior urethral stricture  Procedure: Cystoscopy, retrograde urethrogram with interpretation Urethral balloon dilation  Surgeon: Ardis Hughs, MD  Anesthesia: General  Complications: None  Intraoperative findings:  #1: The retrograde urethrogram demonstrated a narrowing of the anterior urethral stricture that was approximately 1 cm long.  There was also a proximal bulbar urethral stricture that was quite dense and narrow for approximately 55m. #2: The location of the strictures required 2 separate balloons which was necessary to dilate both of the strictures separately.  EBL: Minimal  Specimens: None  Indication: Dylan Owen a 56y.o. patient with history of urethral stricture that was last dilated manipulated in 2004.  He presented to the clinic with worsening stream and difficulty voiding.  Cystoscopy demonstrated a small fairly soft anterior urethral stricture as well as a stricture that was much denser and more narrowed in the proximal bulb.  After reviewing the management options for treatment, he elected to proceed with the above surgical procedure(s). We have discussed the potential benefits and risks of the procedure, side effects of the proposed treatment, the likelihood of the patient achieving the goals of the procedure, and any potential problems that might occur during the procedure or recuperation. Informed consent has been obtained.  Description of procedure:  The patient was taken to the operating room and general anesthesia was induced.  The patient was placed in the dorsal lithotomy position, prepped and draped in the usual sterile fashion, and preoperative antibiotics were administered. A preoperative time-out was performed.   183French Foley catheter was inserted into the fossa navicularis and the balloon was inflated with 4 cc of sterile water.  The the penis  was then held on stretch and with the C-arm rotated to approximately 20 degrees and the oblique angle 20 cc of contrast was injected through the Foley catheter and a retrograde urethrogram was performed.  The above findings were noted.  I then advanced the 21 French cystoscope into the patient's urethra into the area within the membranous urethra where the above-noted stricture was.  I did did pass that partially but was unable to get all the way across it by gentle pressure with the cystoscope.  I subsequently advanced a sensor wire through the scope and up into the bladder confirmed with fluoroscopy.  Subsequently remove the scope and then passed the scope alongside the wire.  I then advanced the OPTi lumen 5 cm x 30 French balloon dilator up across the stricture and soaked normal saline for 1 minute prior to inflating it to 10 cc of mercury.  The balloon was left up for approximately 5 minutes and subsequently released.  Reevaluated this area there was a nice dilation and evidence of medication within the walls of the urethra.  However, the balloon did not cross the proximal bulbous urethral stricture.  As such I opted to pass a second balloon across the stricture and dilated in a similar manner as above.  The balloon was subsequently taken down and I was able to advance the cystoscope into the patient's bladder.  Cystoscopy demonstrated normal but very capacious bladder.  The ureteral orifice ease were orthotopic and there were no other mucosal abnormalities.  The scope was subsequently removed and a 154French Foley catheter was passed into the patient's urethra.  10 cc of sterile water was inflated into the balloon.  Patient was subsequently awoken and returned the PACU in stable condition.  Disposition:  The patient will be scheduled for Foley catheter removal on Monday morning.  Ardis Hughs, M.D.

## 2021-08-21 NOTE — Transfer of Care (Signed)
Immediate Anesthesia Transfer of Care Note  Patient: Dylan Owen  Procedure(s) Performed: RETROGRADE URETHROGRAM CYSTOSCOPY WITH URETHRAL DILATATION OPTILUME  Patient Location: PACU  Anesthesia Type:General  Level of Consciousness: awake, alert  and patient cooperative  Airway & Oxygen Therapy: Patient Spontanous Breathing and Patient connected to face mask oxygen  Post-op Assessment: Report given to RN and Post -op Vital signs reviewed and stable  Post vital signs: Reviewed and stable  Last Vitals:  Vitals Value Taken Time  BP 177/112 08/21/21 0847  Temp 36.9 C 08/21/21 0845  Pulse 84 08/21/21 0849  Resp 21 08/21/21 0849  SpO2 98 % 08/21/21 0849  Vitals shown include unvalidated device data.  Last Pain:  Vitals:   08/21/21 0615  TempSrc:   PainSc: 5       Patients Stated Pain Goal: 3 (08/65/78 4696)  Complications: No notable events documented.

## 2021-08-21 NOTE — Anesthesia Preprocedure Evaluation (Signed)
Anesthesia Evaluation  Patient identified by MRN, date of birth, ID band Patient awake    Reviewed: Allergy & Precautions, NPO status , Patient's Chart, lab work & pertinent test results  Airway Mallampati: II  TM Distance: >3 FB Neck ROM: Full    Dental no notable dental hx.    Pulmonary asthma , sleep apnea ,    Pulmonary exam normal breath sounds clear to auscultation       Cardiovascular hypertension, Pt. on medications + angina Normal cardiovascular exam Rhythm:Regular Rate:Normal     Neuro/Psych Depression negative neurological ROS  negative psych ROS   GI/Hepatic Neg liver ROS, GERD  ,  Endo/Other  diabetes, Type 2Hypothyroidism Morbid obesity  Renal/GU negative Renal ROS  negative genitourinary   Musculoskeletal negative musculoskeletal ROS (+)   Abdominal (+) + obese,   Peds negative pediatric ROS (+)  Hematology negative hematology ROS (+)   Anesthesia Other Findings   Reproductive/Obstetrics negative OB ROS                             Anesthesia Physical Anesthesia Plan  ASA: 3  Anesthesia Plan: General   Post-op Pain Management: Dilaudid IV and Minimal or no pain anticipated   Induction: Intravenous  PONV Risk Score and Plan: 2 and Ondansetron, Midazolam and Treatment may vary due to age or medical condition  Airway Management Planned: LMA  Additional Equipment:   Intra-op Plan:   Post-operative Plan: Extubation in OR  Informed Consent: I have reviewed the patients History and Physical, chart, labs and discussed the procedure including the risks, benefits and alternatives for the proposed anesthesia with the patient or authorized representative who has indicated his/her understanding and acceptance.     Dental advisory given  Plan Discussed with: CRNA  Anesthesia Plan Comments:         Anesthesia Quick Evaluation

## 2021-08-21 NOTE — Anesthesia Postprocedure Evaluation (Signed)
Anesthesia Post Note  Patient: Dylan Owen  Procedure(s) Performed: RETROGRADE URETHROGRAM CYSTOSCOPY WITH URETHRAL DILATATION OPTILUME     Patient location during evaluation: PACU Anesthesia Type: General Level of consciousness: awake and alert Pain management: pain level controlled Vital Signs Assessment: post-procedure vital signs reviewed and stable Respiratory status: spontaneous breathing, nonlabored ventilation and respiratory function stable Cardiovascular status: blood pressure returned to baseline and stable Postop Assessment: no apparent nausea or vomiting Anesthetic complications: no   No notable events documented.  Last Vitals:  Vitals:   08/21/21 0915 08/21/21 0930  BP:  (!) 167/101  Pulse: 80 81  Resp: (!) 23 17  Temp: 36.5 C   SpO2: 96% 96%    Last Pain:  Vitals:   08/21/21 0930  TempSrc:   PainSc: 2                  Lynda Rainwater

## 2021-08-21 NOTE — Discharge Instructions (Signed)

## 2021-08-21 NOTE — Interval H&P Note (Signed)
History and Physical Interval Note:  08/21/2021 6:45 AM  Dylan Owen  has presented today for surgery, with the diagnosis of URETHERAL STRICTURE.  The various methods of treatment have been discussed with the patient and family. After consideration of risks, benefits and other options for treatment, the patient has consented to  Procedure(s): RETROGRADE URETHROGRAM (N/A) CYSTOSCOPY WITH URETHRAL DILATATION OPTILUME (N/A) as a surgical intervention.  The patient's history has been reviewed, patient examined, no change in status, stable for surgery.  I have reviewed the patient's chart and labs.  Questions were answered to the patient's satisfaction.     Ardis Hughs

## 2021-08-22 ENCOUNTER — Encounter (HOSPITAL_COMMUNITY): Payer: Self-pay | Admitting: Urology

## 2021-08-24 DIAGNOSIS — N35011 Post-traumatic bulbous urethral stricture: Secondary | ICD-10-CM | POA: Diagnosis not present

## 2021-08-30 ENCOUNTER — Other Ambulatory Visit: Payer: Self-pay | Admitting: Family Medicine

## 2021-09-09 NOTE — Progress Notes (Signed)
[facility])                                            [facility] Team                                        Statin Quality Measure Assessment    09/09/2021  Dylan Owen 01-26-1966 [id number]  Per review of chart and payor information, this patient has been flagged for non-adherence to the following CMS Quality Measure:   '[x]'$  Statin Use in Persons with Diabetes        - He is not currently taking a cholesterol lowering medication        - Prior documentation of statin intolerance myopathy/myalgia as noted by recent encounter with PCP Dr. Anitra Lauth. If clinically appropriate, please consider re-challenging statin, alternative statin dosing, or associating exclusion code (see below) at the next office visit on 10/01/2021.  '[]'$  Statin Use in Persons with Cardiovascular Disease   Please consider ONE of the following recommendations:   Initiate high intensity statin Atorvastatin '40mg'$  once daily, #90, 3 refills   Rosuvastatin '20mg'$  once daily, #90, 3 refills    Initiate moderate intensity          statin with reduced frequency if prior          statin intolerance 1x weekly, #13, 3 refills   2x weekly, #26, 3 refills   3x weekly, #39, 3 refills    Code for past statin intolerance or other exclusions (required annually)  Drug Induced Myopathy G72.0   Myositis, unspecified M60.9   Rhabdomyolysis M62.82   Prediabetes R73.03   Myalgia M79.1    Thank you for your time,  Kristeen Miss, [place] Cell: [phone]

## 2021-09-19 ENCOUNTER — Other Ambulatory Visit: Payer: Self-pay | Admitting: Family Medicine

## 2021-09-19 DIAGNOSIS — E1149 Type 2 diabetes mellitus with other diabetic neurological complication: Secondary | ICD-10-CM

## 2021-09-30 DIAGNOSIS — M961 Postlaminectomy syndrome, not elsewhere classified: Secondary | ICD-10-CM | POA: Diagnosis not present

## 2021-09-30 DIAGNOSIS — E1142 Type 2 diabetes mellitus with diabetic polyneuropathy: Secondary | ICD-10-CM | POA: Diagnosis not present

## 2021-09-30 DIAGNOSIS — G894 Chronic pain syndrome: Secondary | ICD-10-CM | POA: Diagnosis not present

## 2021-09-30 DIAGNOSIS — Z79891 Long term (current) use of opiate analgesic: Secondary | ICD-10-CM | POA: Diagnosis not present

## 2021-10-01 ENCOUNTER — Ambulatory Visit: Payer: HMO | Admitting: Family Medicine

## 2021-10-02 ENCOUNTER — Ambulatory Visit (HOSPITAL_BASED_OUTPATIENT_CLINIC_OR_DEPARTMENT_OTHER): Payer: HMO | Admitting: Internal Medicine

## 2021-10-02 ENCOUNTER — Encounter (HOSPITAL_BASED_OUTPATIENT_CLINIC_OR_DEPARTMENT_OTHER): Payer: Self-pay | Admitting: Internal Medicine

## 2021-10-02 VITALS — BP 160/84 | HR 94 | Ht 73.0 in | Wt 328.6 lb

## 2021-10-02 DIAGNOSIS — M791 Myalgia, unspecified site: Secondary | ICD-10-CM | POA: Diagnosis not present

## 2021-10-02 DIAGNOSIS — G4733 Obstructive sleep apnea (adult) (pediatric): Secondary | ICD-10-CM

## 2021-10-02 DIAGNOSIS — E785 Hyperlipidemia, unspecified: Secondary | ICD-10-CM

## 2021-10-02 DIAGNOSIS — I10 Essential (primary) hypertension: Secondary | ICD-10-CM

## 2021-10-02 DIAGNOSIS — T466X5A Adverse effect of antihyperlipidemic and antiarteriosclerotic drugs, initial encounter: Secondary | ICD-10-CM | POA: Diagnosis not present

## 2021-10-02 DIAGNOSIS — Z8639 Personal history of other endocrine, nutritional and metabolic disease: Secondary | ICD-10-CM

## 2021-10-02 NOTE — Progress Notes (Signed)
LIPID CLINIC CONSULT NOTE  Chief Complaint:  Manage dyslipidemia  Primary Care Physician: Tammi Sou, MD  Primary Cardiologist:  None  HPI:  Dylan Owen is a 56 y.o. male who is being seen today for the evaluation of dyslipidemia at the request of McGowen, Adrian Blackwater, MD. this is a pleasant 56 year old male kindly referred for evaluation management of dyslipidemia.  He has a history of dyslipidemia with intolerance to atorvastatin causing myalgias.  He was advised to try other statins but declined having had significant arm pain.  He also has a peripheral neuropathy and chronic back issues and walks with a cane.  Other medical problems include type 2 diabetes and hypertension, obstructive sleep apnea and other multiple medical problems.  He is on insulin.  He was referred for possible PCSK9 inhibitor therapy.  Recent lipid profile showed total cholesterol 189, HDL 48, triglycerides 120 and LDL 117.  PMHx:  Past Medical History:  Diagnosis Date   B12 deficiency    Blood transfusion without reported diagnosis    Chronic low back pain    Dr. Hardin Negus is pain mgmt MD   Depression    Diabetes mellitus with complication ([place])    DPN + microalbuminuria   GERD (gastroesophageal reflux disease)    Heart murmur    History of adenomatous polyp of colon 05/22/2021   recall 7 yrs   Hypercholesterolemia    Only mild elevation but statin indicated due to comorbid DM.  Atorva -->intol (myalgias).  Pt declines statin re-try as of 09/2020.   Hypertension    OSA (obstructive sleep apnea)    on nighttime home O2 (refuses to wear CPAP because of claustrophobia)   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 + chronic lumbar radiculopathy   Persistent asthma    Never smoker   Seizures ([place])    Stable angina ([place])    nl myoview 12/07  followed by Dr Johnsie Cancel   Statin intolerance    myalgias   Subclinical hypothyroidism 06/2017    Past Surgical History:  Procedure Laterality Date    COLONOSCOPY  05/22/2021   Adenomas--recall 7 years   CYSTOSCOPY WITH RETROGRADE URETHROGRAM N/A 08/21/2021   Procedure: RETROGRADE URETHROGRAM;  Surgeon: Ardis Hughs, MD;  Location: [facility];  Service: Urology;  Laterality: N/A;   CYSTOSCOPY WITH URETHRAL DILATATION N/A 08/21/2021   Procedure: CYSTOSCOPY WITH URETHRAL DILATATION OPTILUME;  Surgeon: Ardis Hughs, MD;  Location: [facility];  Service: Urology;  Laterality: N/A;   LUMBAR SPINE SURGERY     lumbar laminectomy with hardware stabilization, with ensuing arachnoiditis   URETHRAL STRICTURE DILATATION  1996 and 2004.   Laser surgery.    FAMHx:  Family History  Problem Relation Age of Onset   Cancer Paternal Grandfather        prostate   Heart disease Other    Hypertension Other    Other Other        Emotional Illness   Colon cancer Neg Hx    Esophageal cancer Neg Hx    Rectal cancer Neg Hx    Stomach cancer Neg Hx     SOCHx:   reports that he has never smoked. His smokeless tobacco use includes chew. He reports that he does not drink alcohol and does not use drugs.  ALLERGIES:  Allergies  Allergen Reactions   Ace Inhibitors     Kidney failure   Atorvastatin Other (See Comments)    Arm pain   Prednisone  REACTION: Anxiety   Septra [Sulfamethoxazole-Trimethoprim]    Metformin And Related Diarrhea    ROS: Pertinent items noted in HPI and remainder of comprehensive ROS otherwise negative.  HOME MEDS: Current Outpatient Medications on File Prior to Visit  Medication Sig Dispense Refill   Acetaminophen (TYLENOL PO) Take 500 mg by mouth daily as needed (pain).     B-D ULTRAFINE III SHORT PEN 31G X 8 MM MISC USE AS DIRECTED TWICE DAILY FOR MEDICATION INJECTION 100 each 1   blood glucose meter kit and supplies KIT Dispense based on patient and insurance preference. Use to check glucose 4 times daily. 1 each 0   esomeprazole (NEXIUM) 40 MG capsule Take 1 capsule (40 mg total) by mouth daily. 90 capsule 1    FREESTYLE TEST STRIPS test strip USE TO CHECK GLUCOSE 4 TIMES DAILY. 200 strip 4   furosemide (LASIX) 20 MG tablet TAKE 1 TABLET BY MOUTH EVERY MORNING AS NEEDED FOR SWELLING. (Patient taking differently: Take 20 mg by mouth daily as needed for edema.) 90 tablet 1   lisinopril (ZESTRIL) 20 MG tablet TAKE 1 TABLET BY MOUTH EVERY DAY (Patient taking differently: Take 20 mg by mouth daily.) 90 tablet 1   methocarbamol (ROBAXIN) 500 MG tablet Take 500 mg by mouth 2 (two) times daily as needed for muscle spasms.     metoprolol tartrate (LOPRESSOR) 50 MG tablet Take 1 tablet (50 mg total) by mouth 2 (two) times daily. 180 tablet 1   morphine (MS CONTIN) 30 MG 12 hr tablet Take 30-60 mg by mouth See admin instructions. Taking 60 mg in the AM and 30 mg in the afternoon and 60 mg at bedtime     Multiple Vitamin (MULTIVITAMIN) tablet Take 1 tablet by mouth daily.     nitroGLYCERIN (NITROSTAT) 0.4 MG SL tablet PLACE 1 TABLET UNDER THE TONGUE EVERY 5 MINUTES AS NEEDED. (Patient taking differently: Place 0.4 mg under the tongue every 5 (five) minutes as needed for chest pain.) 25 tablet 1   TRESIBA FLEXTOUCH 100 UNIT/ML FlexTouch Pen INJECT 66 UNITS UNDER THE SKIN IN THE MORNING AND AFTERNOON. 66 mL 1   venlafaxine XR (EFFEXOR-XR) 150 MG 24 hr capsule Take 150 mg by mouth 2 (two) times daily.  3   No current facility-administered medications on file prior to visit.    LABS/IMAGING: No results found for this or any previous visit (from the past 48 hour(s)). No results found.  LIPID PANEL:    Component Value Date/Time   CHOL 189 04/01/2021 1446   TRIG 120.0 04/01/2021 1446   HDL 48.20 04/01/2021 1446   CHOLHDL 4 04/01/2021 1446   VLDL 24.0 04/01/2021 1446   LDLCALC 117 (H) 04/01/2021 1446   LDLCALC 95 10/18/2019 1428   LDLDIRECT 166.2 11/09/2013 1501    WEIGHTS: Wt Readings from Last 3 Encounters:  10/02/21 (!) 328 lb 9.6 oz (149.1 kg)  08/21/21 (!) 311 lb 15.2 oz (141.5 kg)  08/19/21 (!) 312 lb  (141.5 kg)    VITALS: BP (!) 160/84   Pulse 94   Ht $R'6\' 1"'eH$  (1.854 m)   Wt (!) 328 lb 9.6 oz (149.1 kg)   SpO2 98%   BMI 43.35 kg/m   EXAM: Deferred  EKG: Deferred  ASSESSMENT: Mixed dyslipidemia, goal LDL less than 70 Type 2 diabetes on insulin Peripheral neuropathy Statin myalgias Hypertension Morbid obesity Obstructive sleep apnea  PLAN: 1.   Mr. Rains has multiple medical problems including diabetes with neuropathy and has been intolerant  to atorvastatin causing significant myalgia.  He is unwilling to try additional statins and needs significant lipid-lowering to reach a target LDL less than 70.  As a diabetic with multiple additional risk factors, he is a good candidate for PCSK9 inhibitor.  We will pursue prior authorization for this and plan repeat lipid NMR and LP(a) in about 3 to 4 months and follow-up at that time.  Thanks again for the kind referral.  Pixie Casino, MD, FACC, [place] Director of the [facility]  [facility] Diplomate of the American Board of Clinical Lipidology Attending Cardiologist  Direct Dial: [phone]  Fax: [phone]  Website:  [email or web address] 10/02/2021, 4:46 PM

## 2021-10-02 NOTE — Patient Instructions (Signed)
Medication Instructions:  Dr. Debara Pickett recommends Repatha Sureclick or Praluent (PCSK9). This is an injectable cholesterol medication self-administered once every 14 days. This medication will likely need prior approval with your insurance company, which we will work on. If the medication is not approved initially, we may need to do an appeal with your insurance.   Administer medication in area of fatty tissue such as abdomen, outer thigh, back of upper arm - and rotate site with each injection Store medication in refrigerator until ready to administer - allow to sit at room temp for 30 mins - 1 hour prior to injection Dispose of medication in a SHARPS container - your pharmacy should be able to direct you on this and proper disposal   If you need a co-pay card for Repatha: [email or web address] >> paying for Repatha or red box that says "Repatha Copay Card" in top right If you need a co-pay card for Praluent: [email or web address]  Patient Assistance:    These foundations have funds at various times.   The [employer] Foundation: https://www.panfoundation.org/disease-funds/hypercholesterolemia/ -- can sign up for wait list  The [facility] offers assistance to help pay for medication copays.  They will cover copays for all cholesterol lowering meds, including statins, fibrates, omega-3 fish oils like Vascepa, ezetimibe, Repatha, Praluent, Nexletol, Nexlizet.  The cards are usually good for $2,500 or 12 months, whichever comes first. Go to healthwellfoundation.org Click on "Apply Now" Answer questions as to whom is applying (patient or representative) Your disease fund will be "hypercholesterolemia - Medicare access" They will ask questions about finances and which medications you are taking for cholesterol When you submit, the approval is usually within minutes.  You will need to print the card information from the site You will need to show this information to your pharmacy, they  will bill your Medicare Part D plan first -then bill Health Well --for the copay.   You can also call them at [phone], although the hold times can be quite long.     *If you need a refill on your cardiac medications before your next appointment, please call your pharmacy*   Lab Work: FASTING lab work to check cholesterol in about 3-4 months   If you have labs (blood work) drawn today and your tests are completely normal, you will receive your results only by: [place] (if you have MyChart) OR A paper copy in the mail If you have any lab test that is abnormal or we need to change your treatment, we will call you to review the results.   Testing/Procedures: NONE   Follow-Up: At [facility], you and your health needs are our priority.  As part of our continuing mission to provide you with exceptional heart care, we have created designated Provider Care Teams.  These Care Teams include your primary Cardiologist (physician) and Advanced Practice Providers (APPs -  Physician Assistants and Nurse Practitioners) who all work together to provide you with the care you need, when you need it.  We recommend signing up for the patient portal called "MyChart".  Sign up information is provided on this After Visit Summary.  MyChart is used to connect with patients for Virtual Visits (Telemedicine).  Patients are able to view lab/test results, encounter notes, upcoming appointments, etc.  Non-urgent messages can be sent to your provider as well.   To learn more about what you can do with MyChart, go to [email or web address].    Your next appointment:    4 months with Dr. Debara Pickett -- lipid  clinic

## 2021-10-05 ENCOUNTER — Ambulatory Visit: Payer: HMO | Admitting: Family Medicine

## 2021-10-05 DIAGNOSIS — E118 Type 2 diabetes mellitus with unspecified complications: Secondary | ICD-10-CM

## 2021-10-05 NOTE — Progress Notes (Deleted)
OFFICE VISIT  10/05/2021  CC: No chief complaint on file.   HPI:    Patient is a 56 y.o. male who presents for 3 mo f/u  A/P as of last visit: "#1 diabetes with severe diabetic peripheral neuropathy. Poor control, chronic insulin therapy.  Noncompliant with insulin in the past, currently estimates he is about 50 to 25% compliant. He has had fear of insulin causing weight gain in the past. Hemoglobin A1c today. Feet exam normal other than complete lack of sensation from knees down into the toes--chronic.   #2 hypertension, poorly controlled. He resists any increase in BP med dosing or addition of additional BP meds because he feels like BP is only periodically elevated due to periods of acute pain and simply by walking.  Continue Lopressor 50 mg twice daily and lisinopril 20 mg a day. Electrolytes and creatinine today.  3.  Hyperlipidemia, statin induced myositis.   He declines any further trial of statin medication or other lipid-lowering medication. Tried to refer him to lipid clinic last visit but he failed to make appointment. No new plans regarding this problem today.  4.  Acute on chronic right knee pain. Exam pretty unremarkable today. Patient report of osteoarthritis on imaging (MRI?)  In the relatively recent past. Discussed PT but he does not want to proceed with this right now. If it does not resolve on its own pretty soon he will call or return."  INTERIM HX: ***  Past Medical History:  Diagnosis Date   B12 deficiency    Blood transfusion without reported diagnosis    Chronic low back pain    Dr. Hardin Negus is pain mgmt MD   Depression    Diabetes mellitus with complication ([facility])    DPN + microalbuminuria   GERD (gastroesophageal reflux disease)    Heart murmur    History of adenomatous polyp of colon 05/22/2021   recall 7 yrs   Hypercholesterolemia    Only mild elevation but statin indicated due to comorbid DM.  Atorva -->intol (myalgias).  Pt declines statin  re-try as of 09/2020.   Hypertension    OSA (obstructive sleep apnea)    on nighttime home O2 (refuses to wear CPAP because of claustrophobia)   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 + chronic lumbar radiculopathy   Persistent asthma    Never smoker   Seizures ([place])    Stable angina ([place])    nl myoview 12/07  followed by Dr Johnsie Cancel   Statin intolerance    myalgias   Subclinical hypothyroidism 06/2017    Past Surgical History:  Procedure Laterality Date   COLONOSCOPY  05/22/2021   Adenomas--recall 7 years   CYSTOSCOPY WITH RETROGRADE URETHROGRAM N/A 08/21/2021   Procedure: RETROGRADE URETHROGRAM;  Surgeon: Ardis Hughs, MD;  Location: [facility];  Service: Urology;  Laterality: N/A;   CYSTOSCOPY WITH URETHRAL DILATATION N/A 08/21/2021   Procedure: CYSTOSCOPY WITH URETHRAL DILATATION OPTILUME;  Surgeon: Ardis Hughs, MD;  Location: [facility];  Service: Urology;  Laterality: N/A;   LUMBAR SPINE SURGERY     lumbar laminectomy with hardware stabilization, with ensuing arachnoiditis   URETHRAL STRICTURE DILATATION  1996 and 2004.   Laser surgery.    Outpatient Medications Prior to Visit  Medication Sig Dispense Refill   Acetaminophen (TYLENOL PO) Take 500 mg by mouth daily as needed (pain).     B-D ULTRAFINE III SHORT PEN 31G X 8 MM MISC USE AS DIRECTED TWICE DAILY FOR MEDICATION INJECTION 100 each 1  blood glucose meter kit and supplies KIT Dispense based on patient and insurance preference. Use to check glucose 4 times daily. 1 each 0   esomeprazole (NEXIUM) 40 MG capsule Take 1 capsule (40 mg total) by mouth daily. 90 capsule 1   FREESTYLE TEST STRIPS test strip USE TO CHECK GLUCOSE 4 TIMES DAILY. 200 strip 4   furosemide (LASIX) 20 MG tablet TAKE 1 TABLET BY MOUTH EVERY MORNING AS NEEDED FOR SWELLING. (Patient taking differently: Take 20 mg by mouth daily as needed for edema.) 90 tablet 1   lisinopril (ZESTRIL) 20 MG tablet TAKE 1 TABLET BY MOUTH EVERY DAY (Patient taking  differently: Take 20 mg by mouth daily.) 90 tablet 1   methocarbamol (ROBAXIN) 500 MG tablet Take 500 mg by mouth 2 (two) times daily as needed for muscle spasms.     metoprolol tartrate (LOPRESSOR) 50 MG tablet Take 1 tablet (50 mg total) by mouth 2 (two) times daily. 180 tablet 1   morphine (MS CONTIN) 30 MG 12 hr tablet Take 30-60 mg by mouth See admin instructions. Taking 60 mg in the AM and 30 mg in the afternoon and 60 mg at bedtime     Multiple Vitamin (MULTIVITAMIN) tablet Take 1 tablet by mouth daily.     nitroGLYCERIN (NITROSTAT) 0.4 MG SL tablet PLACE 1 TABLET UNDER THE TONGUE EVERY 5 MINUTES AS NEEDED. (Patient taking differently: Place 0.4 mg under the tongue every 5 (five) minutes as needed for chest pain.) 25 tablet 1   TRESIBA FLEXTOUCH 100 UNIT/ML FlexTouch Pen INJECT 66 UNITS UNDER THE SKIN IN THE MORNING AND AFTERNOON. 66 mL 1   venlafaxine XR (EFFEXOR-XR) 150 MG 24 hr capsule Take 150 mg by mouth 2 (two) times daily.  3   No facility-administered medications prior to visit.    Allergies  Allergen Reactions   Ace Inhibitors     Kidney failure   Atorvastatin Other (See Comments)    Arm pain   Prednisone     REACTION: Anxiety   Septra [Sulfamethoxazole-Trimethoprim]    Metformin And Related Diarrhea    ROS As per HPI  PE:    10/02/2021    3:24 PM 08/21/2021    9:30 AM 08/21/2021    9:15 AM  Vitals with BMI  Height _0     Weight 328 lbs 10 oz    BMI 37.90    Systolic 240 973   Diastolic 84 532   Pulse 94 81 80     Physical Exam  ***  LABS:  {Labs (Optional):23779}  IMPRESSION AND PLAN:  No problem-specific Assessment & Plan notes found for this encounter.   An After Visit Summary was printed and given to the patient.  FOLLOW UP: No follow-ups on file.  Signed:  Crissie Sickles, MD           10/05/2021

## 2021-10-07 ENCOUNTER — Ambulatory Visit (INDEPENDENT_AMBULATORY_CARE_PROVIDER_SITE_OTHER): Payer: HMO | Admitting: Family Medicine

## 2021-10-07 ENCOUNTER — Encounter: Payer: Self-pay | Admitting: Family Medicine

## 2021-10-07 VITALS — BP 160/80 | HR 72 | Temp 97.9°F | Ht 73.0 in | Wt 328.4 lb

## 2021-10-07 DIAGNOSIS — E1140 Type 2 diabetes mellitus with diabetic neuropathy, unspecified: Secondary | ICD-10-CM | POA: Diagnosis not present

## 2021-10-07 DIAGNOSIS — I10 Essential (primary) hypertension: Secondary | ICD-10-CM

## 2021-10-07 DIAGNOSIS — E7800 Pure hypercholesterolemia, unspecified: Secondary | ICD-10-CM | POA: Diagnosis not present

## 2021-10-07 DIAGNOSIS — Z794 Long term (current) use of insulin: Secondary | ICD-10-CM

## 2021-10-07 LAB — POCT GLYCOSYLATED HEMOGLOBIN (HGB A1C)
HbA1c POC (<> result, manual entry): 8 % (ref 4.0–5.6)
HbA1c, POC (controlled diabetic range): 8 % — AB (ref 0.0–7.0)
HbA1c, POC (prediabetic range): 8 % — AB (ref 5.7–6.4)
Hemoglobin A1C: 8 % — AB (ref 4.0–5.6)

## 2021-10-07 MED ORDER — LISINOPRIL 40 MG PO TABS: 40.0000 mg | ORAL_TABLET | Freq: Every day | ORAL | 1 refills | Status: DC

## 2021-10-07 MED ORDER — TRESIBA FLEXTOUCH 100 UNIT/ML ~~LOC~~ SOPN: PEN_INJECTOR | SUBCUTANEOUS | 1 refills | Status: DC

## 2021-10-07 NOTE — Patient Instructions (Signed)
I have increased your lisinopril (blood pressure medication) to '40mg'$  daily.  Check blood pressure and heart rate at home daily and write these numbers down and bring them in to review with me at f/u in 10-14d.  Call with the name of the diabetes pill that insurer said they would pay for.

## 2021-10-07 NOTE — Progress Notes (Signed)
OFFICE VISIT  10/07/2021  CC:  Chief Complaint  Patient presents with   Diabetes    Point of care a1c completed; 8.0   Hypertension   Hyperlipidemia    Pt is fasting    Patient is a 56 y.o. male who presents for 77-monthfollow-up diabetes, hypertension, and hyperlipidemia. 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 Poor control, chronic insulin therapy.  Noncompliant with insulin in the past, currently estimates he is about 50 to 25% compliant. He has had fear of insulin causing weight gain in the past. Hemoglobin A1c today. Feet exam normal other than complete lack of sensation from knees down into the toes--chronic.   #2 hypertension, poorly controlled. He resists any increase in BP med dosing or addition of additional BP meds because he feels like BP is only periodically elevated due to periods of acute pain and simply by walking.  Continue Lopressor 50 mg twice daily and lisinopril 20 mg a day. Electrolytes and creatinine today.  3.  Hyperlipidemia, statin induced myositis.   He declines any further trial of statin medication or other lipid-lowering medication. Tried to refer him to lipid clinic last visit but he failed to make appointment. No new plans regarding this problem today.  4.  Acute on chronic right knee pain. Exam pretty unremarkable today. Patient report of osteoarthritis on imaging (MRI?)  In the relatively recent past. Discussed PT but he does not want to proceed with this right now. If it does not resolve on its own pretty soon he will call or return"  INTERIM HX: No acute complaints Home glucoses typically checked twice a day and usually between 125 and 250. Rarely checks home blood pressure, states it is usually up when he has been walking around.  At the pain management office it is often up on the first check and back down on second check but he does not have any specific numbers.  He saw Dr. HDebara Pickettwith the advanced  lipid clinic, PCSK9-I was recommended.  Past Medical History:  Diagnosis Date   B12 deficiency    Blood transfusion without reported diagnosis    Chronic low back pain    Dr. PHardin Negusis pain mgmt MD   Depression    Diabetes mellitus with complication (HCC)    DPN + microalbuminuria   GERD (gastroesophageal reflux disease)    Heart murmur    History of adenomatous polyp of colon 05/22/2021   recall 7 yrs   Hypercholesterolemia    Only mild elevation but statin indicated due to comorbid DM.  Atorva -->intol (myalgias).  Pt declines statin re-try as of 09/2020.   Hypertension    OSA (obstructive sleep apnea)    on nighttime home O2 (refuses to wear CPAP because of claustrophobia)   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 + chronic lumbar radiculopathy   Persistent asthma    Never smoker   [place]izures [place]    [place]    nl myoview 12/07  followed by Dr NJohnsie Cancel  Statin intolerance    myalgias   Subclinical hypothyroidism 06/2017    Past Surgical History:  Procedure Laterality Date   COLONOSCOPY  05/22/2021   Adenomas--recall 7 years   CYSTOSCOPY WITH RETROGRADE URETHROGRAM N/A 08/21/2021   Procedure: RETROGRADE URETHROGRAM;  Surgeon: HArdis Hughs MD;  Location: [facility];  Service: Urology;  Laterality: N/A;   CYSTOSCOPY WITH URETHRAL DILATATION N/A 08/21/2021   Procedure: CYSTOSCOPY WITH URETHRAL DILATATION OPTILUME;  Surgeon: Ardis Hughs, MD;  Location: [facility];  Service: Urology;  Laterality: N/A;   LUMBAR SPINE SURGERY     lumbar laminectomy with hardware stabilization, with ensuing arachnoiditis   URETHRAL STRICTURE DILATATION  1996 and 2004.   Laser surgery.    Outpatient Medications Prior to Visit  Medication Sig Dispense Refill   Acetaminophen (TYLENOL PO) Take 500 mg by mouth daily as needed (pain).     B-D ULTRAFINE III SHORT PEN 31G X 8 MM MISC USE AS DIRECTED TWICE DAILY FOR MEDICATION INJECTION 100 each 1   blood glucose meter kit and  supplies KIT Dispense based on patient and insurance preference. Use to check glucose 4 times daily. 1 each 0   esomeprazole (NEXIUM) 40 MG capsule Take 1 capsule (40 mg total) by mouth daily. 90 capsule 1   FREESTYLE TEST STRIPS test strip USE TO CHECK GLUCOSE 4 TIMES DAILY. 200 strip 4   furosemide (LASIX) 20 MG tablet TAKE 1 TABLET BY MOUTH EVERY MORNING AS NEEDED FOR SWELLING. (Patient taking differently: Take 20 mg by mouth daily as needed for edema.) 90 tablet 1   methocarbamol (ROBAXIN) 500 MG tablet Take 500 mg by mouth 2 (two) times daily as needed for muscle spasms.     metoprolol tartrate (LOPRESSOR) 50 MG tablet Take 1 tablet (50 mg total) by mouth 2 (two) times daily. 180 tablet 1   morphine (MS CONTIN) 30 MG 12 hr tablet Take 30-60 mg by mouth See admin instructions. Taking 60 mg in the AM and 30 mg in the afternoon and 60 mg at bedtime     Multiple Vitamin (MULTIVITAMIN) tablet Take 1 tablet by mouth daily.     venlafaxine XR (EFFEXOR-XR) 150 MG 24 hr capsule Take 150 mg by mouth 2 (two) times daily.  3   lisinopril (ZESTRIL) 20 MG tablet TAKE 1 TABLET BY MOUTH EVERY DAY (Patient taking differently: Take 20 mg by mouth daily.) 90 tablet 1   TRESIBA FLEXTOUCH 100 UNIT/ML FlexTouch Pen INJECT 66 UNITS UNDER THE SKIN IN THE MORNING AND AFTERNOON. 66 mL 1   nitroGLYCERIN (NITROSTAT) 0.4 MG SL tablet PLACE 1 TABLET UNDER THE TONGUE EVERY 5 MINUTES AS NEEDED. (Patient not taking: Reported on 10/07/2021) 25 tablet 1   No facility-administered medications prior to visit.    Allergies  Allergen Reactions   Ace Inhibitors     Kidney failure   Atorvastatin Other (See Comments)    Arm pain   Prednisone     REACTION: Anxiety   Septra [Sulfamethoxazole-Trimethoprim]    Metformin And Related Diarrhea    ROS As per HPI  PE:    10/07/2021    3:37 PM 10/07/2021    3:03 PM 10/02/2021    3:24 PM  Vitals with BMI  Height  6' 1" 6' 1"  Weight  328 lbs 6 oz 328 lbs 10 oz  BMI  31.49 70.26   Systolic 378 588 502  Diastolic 80 81 84  Pulse  72 94  Repeat manual blood pressure today 160/80.  Physical Exam  Gen: Alert, well appearing.  Patient is oriented to person, place, time, and situation. AFFECT: pleasant, lucid thought and speech. CV: RRR, no m/r/g.   LUNGS: CTA bilat, nonlabored resps, good aeration in all lung fields. EXT: 2+ RLL pitting edema, 1+ LLL pitting edema  LABS:  Last CBC Lab Results  Component Value Date   WBC 14.6 (H) 08/19/2021   HGB 15.1 08/19/2021   HCT 48.4  08/19/2021   MCV 86.4 08/19/2021   MCH 27.0 08/19/2021   RDW 12.7 08/19/2021   PLT 380 75/64/3329   Last metabolic panel Lab Results  Component Value Date   GLUCOSE 184 (H) 08/19/2021   NA 139 08/19/2021   K 4.5 08/19/2021   CL 103 08/19/2021   CO2 30 08/19/2021   BUN 16 08/19/2021   CREATININE 0.79 08/19/2021   GFRNONAA >60 08/19/2021   CALCIUM 8.9 08/19/2021   PHOS 2.5 08/10/2012   PROT 7.6 04/01/2021   ALBUMIN 4.0 04/01/2021   BILITOT 0.5 04/01/2021   ALKPHOS 78 04/01/2021   AST 14 04/01/2021   ALT 13 04/01/2021   ANIONGAP 6 08/19/2021   Last lipids Lab Results  Component Value Date   CHOL 189 04/01/2021   HDL 48.20 04/01/2021   LDLCALC 117 (H) 04/01/2021   LDLDIRECT 166.2 11/09/2013   TRIG 120.0 04/01/2021   CHOLHDL 4 04/01/2021   Last hemoglobin A1c Lab Results  Component Value Date   HGBA1C 8.0 (A) 10/07/2021   HGBA1C 8.0 10/07/2021   HGBA1C 8.0 (A) 10/07/2021   HGBA1C 8.0 (A) 10/07/2021   Last thyroid functions Lab Results  Component Value Date   TSH 1.84 09/12/2020   T3TOTAL 127 10/18/2019   POC Hba1c today is 8.0%  IMPRESSION AND PLAN:  #1 diabetes with severe DPN. Improved control, hemoglobin A1c today is 8.0% Continue Tresiba 78 units every morning and 68 units every afternoon.  He states his insurer told him they will cover one of the SGLT2-I's but he cannot recall the name.  He will call back with this info. Electrolytes and creatinine  today.  #2 hypertension, poor control. Increase lisinopril to 40 mg a day.  Continue Lopressor 50 mg twice a day. Electrolytes and creatinine today.  #3 Hyperlipidemia, history of statin induced muscle pain.   He was recently seen by Dr. Debara Pickett at advanced lipid clinic and there are plans to move forward with PCSK9-I.  An After Visit Summary was printed and given to the patient.  FOLLOW UP: Return for 10-14d f/u HTN.  Signed:  Crissie Sickles, MD           10/07/2021

## 2021-10-08 ENCOUNTER — Telehealth: Payer: Self-pay | Admitting: Internal Medicine

## 2021-10-08 LAB — MICROALBUMIN / CREATININE URINE RATIO
Creatinine,U: 173.2 mg/dL
Microalb Creat Ratio: 99.2 mg/g — ABNORMAL HIGH (ref 0.0–30.0)
Microalb, Ur: 171.9 mg/dL — ABNORMAL HIGH (ref 0.0–1.9)

## 2021-10-08 LAB — COMPREHENSIVE METABOLIC PANEL
ALT: 12 U/L (ref 0–53)
AST: 13 U/L (ref 0–37)
Albumin: 3.7 g/dL (ref 3.5–5.2)
Alkaline Phosphatase: 74 U/L (ref 39–117)
BUN: 15 mg/dL (ref 6–23)
CO2: 32 mEq/L (ref 19–32)
Calcium: 9 mg/dL (ref 8.4–10.5)
Chloride: 98 mEq/L (ref 96–112)
Creatinine, Ser: 0.88 mg/dL (ref 0.40–1.50)
GFR: 96.85 mL/min (ref >60.00)
Glucose, Bld: 179 mg/dL — ABNORMAL HIGH (ref 70–99)
Potassium: 4.8 mEq/L (ref 3.5–5.1)
Sodium: 137 mEq/L (ref 135–145)
Total Bilirubin: 0.4 mg/dL (ref 0.2–1.2)
Total Protein: 6.8 g/dL (ref 6.0–8.3)

## 2021-10-08 NOTE — Telephone Encounter (Signed)
Upon attempt for Repatha in [place], noticed that patient needs LDL value within last 120 days. Per review of chart, last lab work was 03/2021. He had recent labs with PCP, lipid panel was ordered, but not yet resulted. MyChart message sent to patient asking him to complete a fasting cholesterol test - NMR lipoprofile - as soon as able so that we can seek med authorization from his insurance.

## 2021-10-10 ENCOUNTER — Other Ambulatory Visit: Payer: Self-pay | Admitting: Family Medicine

## 2021-10-13 NOTE — Telephone Encounter (Signed)
Left message for patient to notify him fasting labs are needed before PA can be submitted.  Phone: [phone] [facility])

## 2021-10-15 ENCOUNTER — Telehealth: Payer: Self-pay | Admitting: Internal Medicine

## 2021-10-15 NOTE — Telephone Encounter (Signed)
Advised patient that I did not see that a lipid panel had been drawn on 7/5 so advised patient to go ahead and have NMR lipo profile drawn so that his PA can be processed. Patient states he will go tomorrow to have this done.   Advised patient to call back to office with any issues, questions, or concerns. Patient verbalized understanding.

## 2021-10-15 NOTE — Telephone Encounter (Signed)
New Message:      Patient was told that he needs lab work before his PA. Patient wants to know  if he can use his lab work that he had on 10-07-21 from his primary doctor?

## 2021-10-16 DIAGNOSIS — E785 Hyperlipidemia, unspecified: Secondary | ICD-10-CM | POA: Diagnosis not present

## 2021-10-17 LAB — NMR, LIPOPROFILE
Cholesterol, Total: 163 mg/dL (ref 100–199)
HDL Particle Number: 25.9 umol/L — ABNORMAL LOW (ref >=30.5)
HDL-C: 40 mg/dL (ref >39)
LDL Particle Number: 1403 nmol/L — ABNORMAL HIGH (ref <1000)
LDL Size: 20.1 nm — ABNORMAL LOW (ref >20.5)
LDL-C (NIH Calc): 106 mg/dL — ABNORMAL HIGH (ref 0–99)
LP-IR Score: 53 — ABNORMAL HIGH (ref <=45)
Small LDL Particle Number: 979 nmol/L — ABNORMAL HIGH (ref <=527)
Triglycerides: 92 mg/dL (ref 0–149)

## 2021-10-19 ENCOUNTER — Other Ambulatory Visit: Payer: Self-pay | Admitting: Family Medicine

## 2021-10-19 ENCOUNTER — Ambulatory Visit: Payer: HMO | Admitting: Family Medicine

## 2021-10-27 MED ORDER — REPATHA SURECLICK 140 MG/ML ~~LOC~~ SOAJ: 1.0000 | SUBCUTANEOUS | 3 refills | Status: DC

## 2021-10-27 NOTE — Telephone Encounter (Signed)
PA for repatha submitted via CMM  (Key: [id number])

## 2021-10-27 NOTE — Telephone Encounter (Signed)
Left message for patient on cell phone that Repatha is approved  RX sent to [facility]   Approved:  25-JUL-23 -- 25-JAN-24  Quantity:6

## 2021-10-27 NOTE — Addendum Note (Signed)
Addended by: Fidel Levy on: 10/27/2021 02:49 PM   Modules accepted: Orders

## 2021-10-29 ENCOUNTER — Ambulatory Visit: Payer: HMO | Admitting: Family Medicine

## 2021-10-29 NOTE — Progress Notes (Deleted)
OFFICE VISIT  10/29/2021  CC: No chief complaint on file.  Patient is a 56 y.o. male who presents for 3-week follow-up uncontrolled hypertension. A/P as of last visit: "#1 diabetes with severe DPN. Improved control, hemoglobin A1c today is 8.0% Continue Tresiba 78 units every morning and 68 units every afternoon.  He states his insurer told him they will cover one of the SGLT2-I's but he cannot recall the name.  He will call back with this info. Electrolytes and creatinine today.   #2 hypertension, poor control. Increase lisinopril to 40 mg a day.  Continue Lopressor 50 mg twice a day. Electrolytes and creatinine today.   #3 Hyperlipidemia, history of statin induced muscle pain.   He was recently seen by Dr. Debara Pickett at advanced lipid clinic and there are plans to move forward with PCSK9-I."  INTERIM HX: ***   Past Medical History:  Diagnosis Date   B12 deficiency    Blood transfusion without reported diagnosis    Chronic low back pain    Dr. Hardin Negus is pain mgmt MD   Depression    Diabetes mellitus with complication ([place])    DPN + microalbuminuria   GERD (gastroesophageal reflux disease)    Heart murmur    History of adenomatous polyp of colon 05/22/2021   recall 7 yrs   Hypercholesterolemia    Only mild elevation but statin indicated due to comorbid DM.  Atorva -->intol (myalgias).  Pt declines statin re-try as of 09/2020.   Hypertension    OSA (obstructive sleep apnea)    on nighttime home O2 (refuses to wear CPAP because of claustrophobia)   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 + chronic lumbar radiculopathy   Persistent asthma    Never smoker   Seizures ([place])    Stable angina ([place])    nl myoview 12/07  followed by Dr Johnsie Cancel   Statin intolerance    myalgias   Subclinical hypothyroidism 06/2017    Past Surgical History:  Procedure Laterality Date   COLONOSCOPY  05/22/2021   Adenomas--recall 7 years   CYSTOSCOPY WITH RETROGRADE URETHROGRAM N/A 08/21/2021    Procedure: RETROGRADE URETHROGRAM;  Surgeon: Ardis Hughs, MD;  Location: [facility];  Service: Urology;  Laterality: N/A;   CYSTOSCOPY WITH URETHRAL DILATATION N/A 08/21/2021   Procedure: CYSTOSCOPY WITH URETHRAL DILATATION OPTILUME;  Surgeon: Ardis Hughs, MD;  Location: [facility];  Service: Urology;  Laterality: N/A;   LUMBAR SPINE SURGERY     lumbar laminectomy with hardware stabilization, with ensuing arachnoiditis   URETHRAL STRICTURE DILATATION  1996 and 2004.   Laser surgery.    Outpatient Medications Prior to Visit  Medication Sig Dispense Refill   Acetaminophen (TYLENOL PO) Take 500 mg by mouth daily as needed (pain).     B-D ULTRAFINE III SHORT PEN 31G X 8 MM MISC USE AS DIRECTED TWICE DAILY FOR MEDICATION INJECTION 100 each 1   blood glucose meter kit and supplies KIT Dispense based on patient and insurance preference. Use to check glucose 4 times daily. 1 each 0   esomeprazole (NEXIUM) 40 MG capsule Take 1 capsule (40 mg total) by mouth daily. 90 capsule 1   Evolocumab (REPATHA SURECLICK) 151 MG/ML SOAJ Inject 1 Dose into the skin every 14 (fourteen) days. 6 mL 3   FREESTYLE TEST STRIPS test strip USE TO CHECK GLUCOSE 4 TIMES DAILY. 200 strip 4   furosemide (LASIX) 20 MG tablet TAKE 1 TABLET BY MOUTH EVERY MORNING AS NEEDED FOR SWELLING 90 tablet  1   insulin degludec (TRESIBA FLEXTOUCH) 100 UNIT/ML FlexTouch Pen 78 U qAM and 68 U qPM 66 mL 1   lisinopril (ZESTRIL) 40 MG tablet Take 1 tablet (40 mg total) by mouth daily. 90 tablet 1   methocarbamol (ROBAXIN) 500 MG tablet Take 500 mg by mouth 2 (two) times daily as needed for muscle spasms.     metoprolol tartrate (LOPRESSOR) 50 MG tablet Take 1 tablet (50 mg total) by mouth 2 (two) times daily. 180 tablet 1   morphine (MS CONTIN) 30 MG 12 hr tablet Take 30-60 mg by mouth See admin instructions. Taking 60 mg in the AM and 30 mg in the afternoon and 60 mg at bedtime     Multiple Vitamin (MULTIVITAMIN) tablet Take 1 tablet by  mouth daily.     nitroGLYCERIN (NITROSTAT) 0.4 MG SL tablet PLACE 1 TABLET UNDER THE TONGUE EVERY 5 MINUTES AS NEEDED. (Patient not taking: Reported on 10/07/2021) 25 tablet 1   venlafaxine XR (EFFEXOR-XR) 150 MG 24 hr capsule Take 150 mg by mouth 2 (two) times daily.  3   No facility-administered medications prior to visit.    Allergies  Allergen Reactions   Ace Inhibitors     Kidney failure   Atorvastatin Other (See Comments)    Arm pain   Prednisone     REACTION: Anxiety   Septra [Sulfamethoxazole-Trimethoprim]    Metformin And Related Diarrhea    ROS As per HPI  PE:    10/07/2021    3:37 PM 10/07/2021    3:03 PM 10/02/2021    3:24 PM  Vitals with BMI  Height  6' 1" 6' 1"  Weight  328 lbs 6 oz 328 lbs 10 oz  BMI  43.34 43.36  Systolic 160 196 160  Diastolic 80 81 84  Pulse  72 94     Physical Exam  ***  LABS:  Last CBC Lab Results  Component Value Date   WBC 14.6 (H) 08/19/2021   HGB 15.1 08/19/2021   HCT 48.4 08/19/2021   MCV 86.4 08/19/2021   MCH 27.0 08/19/2021   RDW 12.7 08/19/2021   PLT 380 08/19/2021   Last metabolic panel Lab Results  Component Value Date   GLUCOSE 179 (H) 10/07/2021   NA 137 10/07/2021   K 4.8 10/07/2021   CL 98 10/07/2021   CO2 32 10/07/2021   BUN 15 10/07/2021   CREATININE 0.88 10/07/2021   GFRNONAA >60 08/19/2021   CALCIUM 9.0 10/07/2021   PHOS 2.5 08/10/2012   PROT 6.8 10/07/2021   ALBUMIN 3.7 10/07/2021   BILITOT 0.4 10/07/2021   ALKPHOS 74 10/07/2021   AST 13 10/07/2021   ALT 12 10/07/2021   ANIONGAP 6 08/19/2021   Last hemoglobin A1c Lab Results  Component Value Date   HGBA1C 8.0 (A) 10/07/2021   HGBA1C 8.0 10/07/2021   HGBA1C 8.0 (A) 10/07/2021   HGBA1C 8.0 (A) 10/07/2021   IMPRESSION AND PLAN:  No problem-specific Assessment & Plan notes found for this encounter.   An After Visit Summary was printed and given to the patient.  FOLLOW UP: No follow-ups on file.  Signed:  Phil McGowen, MD            10/29/2021  

## 2021-10-30 ENCOUNTER — Telehealth: Payer: Self-pay | Admitting: Internal Medicine

## 2021-10-30 NOTE — Telephone Encounter (Signed)
Left message that med is approved, [facility]

## 2021-10-30 NOTE — Telephone Encounter (Signed)
Patient states he came in about a week to have labs drawn, that was needed for the prior auth.  He wants to know if it went through.

## 2021-11-05 ENCOUNTER — Ambulatory Visit (INDEPENDENT_AMBULATORY_CARE_PROVIDER_SITE_OTHER): Payer: PPO | Admitting: Family Medicine

## 2021-11-05 ENCOUNTER — Encounter: Payer: Self-pay | Admitting: Family Medicine

## 2021-11-05 VITALS — BP 181/80 | HR 92 | Temp 99.0°F | Ht 73.0 in | Wt 328.0 lb

## 2021-11-05 DIAGNOSIS — I10 Essential (primary) hypertension: Secondary | ICD-10-CM

## 2021-11-05 MED ORDER — TRESIBA FLEXTOUCH 100 UNIT/ML ~~LOC~~ SOPN: PEN_INJECTOR | SUBCUTANEOUS | 1 refills | Status: DC

## 2021-11-05 MED ORDER — METOPROLOL TARTRATE 50 MG PO TABS: ORAL_TABLET | ORAL | 1 refills | Status: DC

## 2021-11-05 NOTE — Progress Notes (Signed)
OFFICE VISIT  11/05/2021  CC:  Chief Complaint  Patient presents with   Hypertension    Follow up   Patient is a 56 y.o. male who presents for 1 mo f/u HTN. A/P as of last visit: "#1 diabetes with severe DPN. Improved control, hemoglobin A1c today is 8.0% Continue Tresiba 78 units every morning and 68 units every afternoon.  He states his insurer told him they will cover one of the SGLT2-I's but he cannot recall the name.  He will call back with this info. Electrolytes and creatinine today.   #2 hypertension, poor control. Increase lisinopril to 40 mg a day.  Continue Lopressor 50 mg twice a day. Electrolytes and creatinine today.   #3 Hyperlipidemia, history of statin induced muscle pain.   He was recently seen by Dr. Debara Pickett at advanced [facility] clinic and there are plans to move forward with PCSK9-I."  INTERIM HX: He feels well.  He does not have a home blood pressure cuff so he has not been able to monitor his blood pressure. He did increase his lisinopril to 40 mg daily as instructed.  No side effects.  Of note, he increased morning tresiba to 80 U, continues 68 qPM.    He got his first Repatha injection recently.  No side effects.   Past Medical History:  Diagnosis Date   B12 deficiency    Blood transfusion without reported diagnosis    Chronic low back pain    Dr. Hardin Negus is pain mgmt MD   Depression    Diabetes mellitus with complication (HCC)    DPN + microalbuminuria   GERD (gastroesophageal reflux disease)    Heart murmur    History of adenomatous polyp of colon 05/22/2021   recall 7 yrs   Hypercholesterolemia    Only mild elevation but statin indicated due to comorbid DM.  Atorva -->intol (myalgias).  Pt declines statin re-try as of 09/2020.   Hypertension    OSA (obstructive sleep apnea)    on nighttime home O2 (refuses to wear CPAP because of claustrophobia)   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 + chronic lumbar radiculopathy   Persistent asthma     Never smoker   Seizures ([place])    Stable angina ([place])    nl myoview 12/07  followed by Dr Johnsie Cancel   Statin intolerance    myalgias   Subclinical hypothyroidism 06/2017    Past Surgical History:  Procedure Laterality Date   COLONOSCOPY  05/22/2021   Adenomas--recall 7 years   CYSTOSCOPY WITH RETROGRADE URETHROGRAM N/A 08/21/2021   Procedure: RETROGRADE URETHROGRAM;  Surgeon: Ardis Hughs, MD;  Location: [facility];  Service: Urology;  Laterality: N/A;   CYSTOSCOPY WITH URETHRAL DILATATION N/A 08/21/2021   Procedure: CYSTOSCOPY WITH URETHRAL DILATATION OPTILUME;  Surgeon: Ardis Hughs, MD;  Location: [facility];  Service: Urology;  Laterality: N/A;   LUMBAR SPINE SURGERY     lumbar laminectomy with hardware stabilization, with ensuing arachnoiditis   URETHRAL STRICTURE DILATATION  1996 and 2004.   Laser surgery.    Outpatient Medications Prior to Visit  Medication Sig Dispense Refill   Acetaminophen (TYLENOL PO) Take 500 mg by mouth daily as needed (pain).     B-D ULTRAFINE III SHORT PEN 31G X 8 MM MISC USE AS DIRECTED TWICE DAILY FOR MEDICATION INJECTION 100 each 1   blood glucose meter kit and supplies KIT Dispense based on patient and insurance preference. Use to check glucose 4 times daily. 1 each 0  esomeprazole (NEXIUM) 40 MG capsule Take 1 capsule (40 mg total) by mouth daily. 90 capsule 1   Evolocumab (REPATHA SURECLICK) 045 MG/ML SOAJ Inject 1 Dose into the skin every 14 (fourteen) days. 6 mL 3   FREESTYLE TEST STRIPS test strip USE TO CHECK GLUCOSE 4 TIMES DAILY. 200 strip 4   furosemide (LASIX) 20 MG tablet TAKE 1 TABLET BY MOUTH EVERY MORNING AS NEEDED FOR SWELLING 90 tablet 1   insulin degludec (TRESIBA FLEXTOUCH) 100 UNIT/ML FlexTouch Pen 78 U qAM and 68 U qPM 66 mL 1   lisinopril (ZESTRIL) 40 MG tablet Take 1 tablet (40 mg total) by mouth daily. 90 tablet 1   methocarbamol (ROBAXIN) 500 MG tablet Take 500 mg by mouth 2 (two) times daily as needed for muscle spasms.      metoprolol tartrate (LOPRESSOR) 50 MG tablet Take 1 tablet (50 mg total) by mouth 2 (two) times daily. 180 tablet 1   morphine (MS CONTIN) 30 MG 12 hr tablet Take 30-60 mg by mouth See admin instructions. Taking 60 mg in the AM and 30 mg in the afternoon and 60 mg at bedtime     Multiple Vitamin (MULTIVITAMIN) tablet Take 1 tablet by mouth daily.     venlafaxine XR (EFFEXOR-XR) 150 MG 24 hr capsule Take 150 mg by mouth 2 (two) times daily.  3   nitroGLYCERIN (NITROSTAT) 0.4 MG SL tablet PLACE 1 TABLET UNDER THE TONGUE EVERY 5 MINUTES AS NEEDED. (Patient not taking: Reported on 10/07/2021) 25 tablet 1   No facility-administered medications prior to visit.    Allergies  Allergen Reactions   Ace Inhibitors     Kidney failure   Atorvastatin Other (See Comments)    Arm pain   Prednisone     REACTION: Anxiety   Septra [Sulfamethoxazole-Trimethoprim]    Metformin And Related Diarrhea    ROS As per HPI  PE:    11/05/2021    2:30 PM 10/07/2021    3:37 PM 10/07/2021    3:03 PM  Vitals with BMI  Height $Remov'6\' 1"'QQihNL$   '6\' 1"'$   Weight 328 lbs  328 lbs 6 oz  BMI 40.98  11.91  Systolic 478 295 621  Diastolic 80 80 81  Pulse 92  72  Repeat BP manual--180/100 Physical Exam  Gen: Alert, well appearing.  Patient is oriented to person, place, time, and situation. AFFECT: pleasant, lucid thought and speech. No further exam today.  LABS:  Last metabolic panel Lab Results  Component Value Date   GLUCOSE 179 (H) 10/07/2021   NA 137 10/07/2021   K 4.8 10/07/2021   CL 98 10/07/2021   CO2 32 10/07/2021   BUN 15 10/07/2021   CREATININE 0.88 10/07/2021   GFRNONAA >60 08/19/2021   CALCIUM 9.0 10/07/2021   PHOS 2.5 08/10/2012   PROT 6.8 10/07/2021   ALBUMIN 3.7 10/07/2021   BILITOT 0.4 10/07/2021   ALKPHOS 74 10/07/2021   AST 13 10/07/2021   ALT 12 10/07/2021   ANIONGAP 6 08/19/2021   Lab Results  Component Value Date   HGBA1C 8.0 (A) 10/07/2021   HGBA1C 8.0 10/07/2021   HGBA1C 8.0 (A)  10/07/2021   HGBA1C 8.0 (A) 10/07/2021   IMPRESSION AND PLAN:  #1 uncontrolled hypertension. His blood pressure has been quite a bit uncontrolled for quite some time now. He had been resistant to increase or addition of medications in the past but he is now on board with this. Recent increase of lisinopril has not made  any significant improvement. We will increase his Lopressor to 100 mg twice a day today.  He has plenty of 50 mg tabs to double up at this time. Basic metabolic panel today.  An After Visit Summary was printed and given to the patient.  FOLLOW UP: No follow-ups on file.  Signed:  Crissie Sickles, MD           11/05/2021

## 2021-11-06 LAB — BASIC METABOLIC PANEL
BUN: 17 mg/dL (ref 6–23)
CO2: 32 mEq/L (ref 19–32)
Calcium: 8.7 mg/dL (ref 8.4–10.5)
Chloride: 101 mEq/L (ref 96–112)
Creatinine, Ser: 0.86 mg/dL (ref 0.40–1.50)
GFR: 97.47 mL/min (ref >60.00)
Glucose, Bld: 234 mg/dL — ABNORMAL HIGH (ref 70–99)
Potassium: 4.8 mEq/L (ref 3.5–5.1)
Sodium: 140 mEq/L (ref 135–145)

## 2021-11-11 NOTE — Progress Notes (Signed)
Subjective:   Dylan Owen is a 56 y.o. male who presents for Medicare Annual/Subsequent preventive examination.   I connected with  Drystan Reader Balazs on 11/14/21 by an audio only telemedicine application and verified that I am speaking with the correct person using two identifiers.   I discussed the limitations, risks, security and privacy concerns of performing an evaluation and management service by telephone and the availability of in person appointments. I also discussed with the patient that there may be a patient responsible charge related to this service. The patient expressed understanding and verbally consented to this telephonic visit.  Location of Patient: home Location of Provider: office  List any persons and their role that are participating in the visit with the patient.   Adams, CMA  Review of Systems    Defer to PCP Cardiac Risk Factors include: advanced age (>34men, >36 women);diabetes mellitus;obesity (BMI >30kg/m2);male gender;hypertension;sedentary lifestyle     Objective:    There were no vitals filed for this visit. There is no height or weight on file to calculate BMI.     11/14/2021   12:12 PM 08/19/2021    8:59 AM 11/05/2020   11:22 AM 02/14/2020    4:34 PM 03/31/2017    9:44 AM 03/09/2017    4:24 PM  Advanced Directives  Does Patient Have a Medical Advance Directive? No No No No No No  Would patient like information on creating a medical advance directive?   No - Patient declined  No - Patient declined     Current Medications (verified) Outpatient Encounter Medications as of 11/14/2021  Medication Sig   Acetaminophen (TYLENOL PO) Take 500 mg by mouth daily as needed (pain).   B-D ULTRAFINE III SHORT PEN 31G X 8 MM MISC USE AS DIRECTED TWICE DAILY FOR MEDICATION INJECTION   blood glucose meter kit and supplies KIT Dispense based on patient and insurance preference. Use to check glucose 4 times daily.   esomeprazole  (NEXIUM) 40 MG capsule Take 1 capsule (40 mg total) by mouth daily.   Evolocumab (REPATHA SURECLICK) 283 MG/ML SOAJ Inject 1 Dose into the skin every 14 (fourteen) days.   FREESTYLE TEST STRIPS test strip USE TO CHECK GLUCOSE 4 TIMES DAILY.   furosemide (LASIX) 20 MG tablet TAKE 1 TABLET BY MOUTH EVERY MORNING AS NEEDED FOR SWELLING   insulin degludec (TRESIBA FLEXTOUCH) 100 UNIT/ML FlexTouch Pen 80 U qAM and 68 U qPM   lisinopril (ZESTRIL) 40 MG tablet Take 1 tablet (40 mg total) by mouth daily.   methocarbamol (ROBAXIN) 500 MG tablet Take 500 mg by mouth 2 (two) times daily as needed for muscle spasms.   metoprolol tartrate (LOPRESSOR) 50 MG tablet 2 tabs po bid   morphine (MS CONTIN) 30 MG 12 hr tablet Take 30-60 mg by mouth See admin instructions. Taking 60 mg in the AM and 30 mg in the afternoon and 60 mg at bedtime   Multiple Vitamin (MULTIVITAMIN) tablet Take 1 tablet by mouth daily.   nitroGLYCERIN (NITROSTAT) 0.4 MG SL tablet PLACE 1 TABLET UNDER THE TONGUE EVERY 5 MINUTES AS NEEDED. (Patient not taking: Reported on 10/07/2021)   venlafaxine XR (EFFEXOR-XR) 150 MG 24 hr capsule Take 150 mg by mouth 2 (two) times daily.   No facility-administered encounter medications on file as of 11/14/2021.    Allergies (verified) Ace inhibitors, Atorvastatin, Prednisone, Septra [sulfamethoxazole-trimethoprim], and Metformin and related   History: Past Medical History:  Diagnosis Date   B12  deficiency    Blood transfusion without reported diagnosis    Chronic low back pain    Dr. Hardin Negus is pain mgmt MD   Depression    Diabetes mellitus with complication (HCC)    DPN + microalbuminuria   GERD (gastroesophageal reflux disease)    Heart murmur    History of adenomatous polyp of colon 05/22/2021   recall 7 yrs   Hypercholesterolemia    Statin intolerant.  Started Repatha 10/2021   Hypertension    OSA (obstructive sleep apnea)    on nighttime home O2 (refuses to wear CPAP because of  claustrophobia)   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 + chronic lumbar radiculopathy   Persistent asthma    Never smoker   Seizures ([place])    Stable angina ([place])    nl myoview 12/07  followed by Dr Johnsie Cancel   Statin intolerance    myalgias   Subclinical hypothyroidism 06/2017   Past Surgical History:  Procedure Laterality Date   COLONOSCOPY  05/22/2021   Aden[place]--recall 7 years   CYSTOSCOPY WITH RETROGRADE URETHROGRAM N/A 08/21/2021   Procedure: RETROGRADE URETHROGRAM;  Surgeon: Ardis Hughs, MD;  Location: [facility];  Service: Urology;  Laterality: N/A;   CYSTOSCOPY WITH URETHRAL DILATATION N/A 08/21/2021   Procedure: CYSTOSCOPY WITH URETHRAL DILATATION OPTILUME;  Surgeon: Ardis Hughs, MD;  Location: [facility];  Service: Urology;  Laterality: N/A;   LUMBAR SPINE SURGERY     lumbar laminectomy with hardware stabilization, with ensuing arachnoiditis   URETHRAL STRICTURE DILATATION  1996 and 2004.   Laser surgery.   Family History  Problem Relation Age of Onset   Cancer Paternal Grandfather        prostate   Heart disease Other    Hypertension Other    Other Other        Emotional Illness   Colon cancer Neg Hx    Esophageal cancer Neg Hx    Rectal cancer Neg Hx    Stomach cancer Neg Hx    Social History   Socioeconomic History   Marital status: Married    Spouse name: Not on file   Number of children: Not on file   Years of education: Not on file   Highest education level: Not on file  Occupational History   Not on file  Tobacco Use   Smoking status: Never   Smokeless tobacco: Current    Types: Chew   Tobacco comments:    dip  Vaping Use   Vaping Use: Never used  Substance and Sexual Activity   Alcohol use: No   Drug use: No   Sexual activity: Yes  Other Topics Concern   Not on file  Social History Narrative   Married, 4 children.   Occupation: disabled since 2000.     Prior to disability he worked for [employer].   Orig from  [place].   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  No hx of drug abuse.   Chews tobacco since age 36 yrs.   Regular exercise:  No               Social Determinants of Health   Financial Resource Strain: Low Risk  (11/11/2021)   Overall Financial Resource Strain (CARDIA)    Difficulty of Paying Living Expenses: Not hard at all  Food Insecurity: No Food Insecurity (11/11/2021)   Hunger Vital Sign    Worried  About Running Out of Food in the Last Year: Never true    Ran Out of Food in the Last Year: Never true  Transportation Needs: No Transportation Needs (11/11/2021)   PRAPARE - Hydrologist (Medical): No    Lack of Transportation (Non-Medical): No  Physical Activity: Inactive (11/11/2021)   Exercise Vital Sign    Days of Exercise per Week: 0 days    Minutes of Exercise per Session: 0 min  Stress: No Stress Concern Present (11/11/2021)   [place]    Feeling of Stress : Only a little  Social Connections: Moderately Isolated (11/11/2021)   Social Connection and Isolation Panel [NHANES]    Frequency of Communication with Friends and Family: Three times a week    Frequency of Social Gatherings with Friends and Family: Twice a week    Attends Religious Services: Never    Marine scientist or Organizations: No    Attends Music therapist: Never    Marital Status: Married    Tobacco Counseling Ready to quit: Not Answered Counseling given: Not Answered Tobacco comments: dip   Clinical Intake:  Pre-visit preparation completed: Yes        Nutritional Status: BMI > 30  Obese Diabetes: Yes CBG done?: No Did pt. bring in CBG monitor from home?: No  How often do you need to have someone help you when you read instructions, pamphlets, or other written materials from your doctor or pharmacy?: 1 -  Never  Diabetic?Yes  Interpreter Needed?: No      Activities of Daily Living    11/14/2021   12:22 PM 08/19/2021    9:01 AM  In your present state of health, do you have any difficulty performing the following activities:  Hearing? 0   Vision? 0   Difficulty concentrating or making decisions? 0   Walking or climbing stairs? 0   Dressing or bathing? 0   Doing errands, shopping? 0 0  Preparing Food and eating ? N   Using the Toilet? N   In the past six months, have you accidently leaked urine? N   Do you have problems with loss of bowel control? N   Managing your Medications? N   Managing your Finances? N   Housekeeping or managing your Housekeeping? N     Patient Care Team: Tammi Sou, MD as PCP - General (Family Medicine) Nicholaus Bloom, MD as Consulting Physician (Pain Medicine) Kristeen Miss, MD as Consulting Physician (Neurosurgery) Sharyn Creamer, MD as Consulting Physician (Gastroenterology) Debara Pickett Nadean Corwin, MD as Consulting Physician (Cardiology)  Indicate any recent Medical Services you may have received from other than [facility] providers in the past year (date may be approximate).     Assessment:   This is a routine wellness examination for [place].  Hearing/Vision screen No results found.  Dietary issues and exercise activities discussed: Current Exercise Habits: The patient does not participate in regular exercise at present   Goals Addressed   None    Depression Screen    11/14/2021   12:14 PM 07/01/2021    1:33 PM 11/05/2020   11:24 AM 09/12/2020   12:14 PM 06/07/2017   10:51 AM 03/31/2017    9:44 AM  PHQ 2/9 Scores  PHQ - 2 Score 0 $Remov'2 2 2 4   'NIzvjY$ PHQ- 9 Score   '6 10 20   '$ Exception Documentation      Other- indicate reason  in comment box  Not completed      call completed with spouse    Fall Risk    11/14/2021   12:13 PM 07/01/2021    1:32 PM 03/31/2017    9:44 AM  Fall Risk   Falls in the past year? 0 1 No  Number falls in past yr: 0 1    Injury with Fall? 0 0   Follow up Falls evaluation completed Falls evaluation completed     [place]:  Any stairs in or around the home? No  If so, are there any without handrails? No  Home free of loose throw rugs in walkways, pet beds, electrical cords, etc? Yes  Adequate lighting in your home to reduce risk of falls? Yes   ASSISTIVE DEVICES UTILIZED TO PREVENT FALLS:  Life alert? No  Use of a cane, walker or w/c? Yes  Grab bars in the bathroom? No  Shower chair or bench in shower? No  Elevated toilet seat or a handicapped toilet? No   TIMED UP AND GO:  Was the test performed?  n/a .  Length of time to ambulate 10 feet: n/a sec.     Cognitive Function:    11/14/2021   12:12 PM  MMSE - Mini Mental State Exam  Not completed: Unable to complete        11/14/2021   12:12 PM  6CIT Screen  What Year? 0 points  What month? 0 points  What time? 0 points  Count back from 20 0 points  Months in reverse 0 points  Repeat phrase 0 points  Total Score 0 points    Immunizations Immunization History  Administered Date(s) Administered   Influenza Inj Mdck Quad Pf 01/02/2018   Influenza Split 02/08/2012   Influenza Whole 02/08/2008, 01/02/2009, 01/28/2010   Influenza,inj,Quad PF,6+ Mos 12/19/2013, 03/04/2016, 06/07/2017   Influenza-Unspecified 03/07/2013, 02/03/2018, 03/22/2021   PFIZER(Purple Top)SARS-COV-2 Vaccination 08/28/2019, 09/14/2019   PNEUMOCOCCAL CONJUGATE-20 09/12/2020   Pneumococcal Polysaccharide-23 09/27/2008, 03/04/2016   Tdap 12/19/2013   Zoster Recombinat (Shingrix) 09/12/2020, 12/25/2020    TDAP status: Up to date  Flu Vaccine status: Up to date  Pneumococcal vaccine status: Due, Education has been provided regarding the importance of this vaccine. Advised may receive this vaccine at local pharmacy or Health Dept. Aware to provide a copy of the vaccination record if obtained from local pharmacy or Health Dept.  Verbalized acceptance and understanding.  Covid-19 vaccine status: Completed vaccines  Qualifies for Shingles Vaccine? Yes   Zostavax completed No   Shingrix Completed?: Yes  Screening Tests Health Maintenance  Topic Date Due   OPHTHALMOLOGY EXAM  07/08/2017   COVID-19 Vaccine (3 - Pfizer series) 11/09/2019   INFLUENZA VACCINE  11/03/2021   HEMOGLOBIN A1C  04/09/2022   FOOT EXAM  07/02/2022   TETANUS/TDAP  12/20/2023   COLONOSCOPY (Pts 45-60yrs Insurance coverage will need to be confirmed)  05/22/2028   Zoster Vaccines- Shingrix  Completed   HPV VACCINES  Aged Out   Hepatitis C Screening  Discontinued   HIV Screening  Discontinued    Health Maintenance  Health Maintenance Due  Topic Date Due   OPHTHALMOLOGY EXAM  07/08/2017   COVID-19 Vaccine (3 - Pfizer series) 11/09/2019   INFLUENZA VACCINE  11/03/2021    Colorectal cancer screening: Type of screening: Colonoscopy. Completed 05/22/2021. Repeat every 7 years  Lung Cancer Screening: (Low Dose CT Chest recommended if Age 50-80 years, 30 pack-year currently smoking OR have quit  w/in 15years.) does not qualify.   Lung Cancer Screening Referral: n/a  Additional Screening:  Hepatitis C Screening: does qualify; Completed n/a  Vision Screening: Recommended annual ophthalmology exams for early detection of glaucoma and other disorders of the eye. Is the patient up to date with their annual eye exam?  No  Who is the provider or what is the name of the office in which the patient attends annual eye exams? [facility] If pt is not established with a provider, would they like to be referred to a provider to establish care? No .   Dental Screening: Recommended annual dental exams for proper oral hygiene  Community Resource Referral / Chronic Care Management: CRR required this visit?  No   CCM required this visit?  No      Plan:     I have personally reviewed and noted the following in the patient's chart:   Medical  and social history Use of alcohol, tobacco or illicit drugs  Current medications and supplements including opioid prescriptions. Patient is currently taking opioid prescriptions. Information provided to patient regarding non-opioid alternatives. Patient advised to discuss non-opioid treatment plan with their provider. Functional ability and status Nutritional status Physical activity Advanced directives List of other physicians Hospitalizations, surgeries, and ER visits in previous 12 months Vitals Screenings to include cognitive, depression, and falls Referrals and appointments  In addition, I have reviewed and discussed with patient certain preventive protocols, quality metrics, and best practice recommendations. A written personalized care plan for preventive services as well as general preventive health recommendations were provided to patient.     Octaviano Glow, CMA   11/14/2021   Nurse Notes: Non-Face to Face or Face to Face 10 minute visit Encounter   Mr. Milley , Thank you for taking time to come for your Medicare Wellness Visit. I appreciate your ongoing commitment to your health goals. Please review the following plan we discussed and let me know if I can assist you in the future.   These are the goals we discussed:  Goals      Patient Stated     Patient would like to loose weight        This is a list of the screening recommended for you and due dates:  Health Maintenance  Topic Date Due   Eye exam for diabetics  07/08/2017   COVID-19 Vaccine (3 - Pfizer series) 11/09/2019   Flu Shot  11/03/2021   Hemoglobin A1C  04/09/2022   Complete foot exam   07/02/2022   Tetanus Vaccine  12/20/2023   Colon Cancer Screening  05/22/2028   Zoster (Shingles) Vaccine  Completed   HPV Vaccine  Aged Out   Hepatitis C Screening: USPSTF Recommendation to screen - Ages 59-79 yo.  Discontinued   HIV Screening  Discontinued

## 2021-11-11 NOTE — Patient Instructions (Signed)
Health Maintenance, Male Adopting a healthy lifestyle and getting preventive care are important in promoting health and wellness. Ask your health care provider about: The right schedule for you to have regular tests and exams. Things you can do on your own to prevent diseases and keep yourself healthy. What should I know about diet, weight, and exercise? Eat a healthy diet  Eat a diet that includes plenty of vegetables, fruits, low-fat dairy products, and lean protein. Do not eat a lot of foods that are high in solid fats, added sugars, or sodium. Maintain a healthy weight Body mass index (BMI) is a measurement that can be used to identify possible weight problems. It estimates body fat based on height and weight. Your health care provider can help determine your BMI and help you achieve or maintain a healthy weight. Get regular exercise Get regular exercise. This is one of the most important things you can do for your health. Most adults should: Exercise for at least 150 minutes each week. The exercise should increase your heart rate and make you sweat (moderate-intensity exercise). Do strengthening exercises at least twice a week. This is in addition to the moderate-intensity exercise. Spend less time sitting. Even light physical activity can be beneficial. Watch cholesterol and blood lipids Have your blood tested for lipids and cholesterol at 55 years of age, then have this test every 5 years. You may need to have your cholesterol levels checked more often if: Your lipid or cholesterol levels are high. You are older than 55 years of age. You are at high risk for heart disease. What should I know about cancer screening? Many types of cancers can be detected early and may often be prevented. Depending on your health history and family history, you may need to have cancer screening at various ages. This may include screening for: Colorectal cancer. Prostate cancer. Skin cancer. Lung  cancer. What should I know about heart disease, diabetes, and high blood pressure? Blood pressure and heart disease High blood pressure causes heart disease and increases the risk of stroke. This is more likely to develop in people who have high blood pressure readings or are overweight. Talk with your health care provider about your target blood pressure readings. Have your blood pressure checked: Every 3-5 years if you are 18-39 years of age. Every year if you are 40 years old or older. If you are between the ages of 65 and 75 and are a current or former smoker, ask your health care provider if you should have a one-time screening for abdominal aortic aneurysm (AAA). Diabetes Have regular diabetes screenings. This checks your fasting blood sugar level. Have the screening done: Once every three years after age 45 if you are at a normal weight and have a low risk for diabetes. More often and at a younger age if you are overweight or have a high risk for diabetes. What should I know about preventing infection? Hepatitis B If you have a higher risk for hepatitis B, you should be screened for this virus. Talk with your health care provider to find out if you are at risk for hepatitis B infection. Hepatitis C Blood testing is recommended for: Everyone born from 1945 through 1965. Anyone with known risk factors for hepatitis C. Sexually transmitted infections (STIs) You should be screened each year for STIs, including gonorrhea and chlamydia, if: You are sexually active and are younger than 55 years of age. You are older than 55 years of age and your   health care provider tells you that you are at risk for this type of infection. Your sexual activity has changed since you were last screened, and you are at increased risk for chlamydia or gonorrhea. Ask your health care provider if you are at risk. Ask your health care provider about whether you are at high risk for HIV. Your health care provider  may recommend a prescription medicine to help prevent HIV infection. If you choose to take medicine to prevent HIV, you should first get tested for HIV. You should then be tested every 3 months for as long as you are taking the medicine. Follow these instructions at home: Alcohol use Do not drink alcohol if your health care provider tells you not to drink. If you drink alcohol: Limit how much you have to 0-2 drinks a day. Know how much alcohol is in your drink. In the [place] one drink equals one 12 oz bottle of beer (355 mL), one 5 oz glass of wine (148 mL), or one 1 oz glass of hard liquor (44 mL). Lifestyle Do not use any products that contain nicotine or tobacco. These products include cigarettes, chewing tobacco, and vaping devices, such as e-cigarettes. If you need help quitting, ask your health care provider. Do not use street drugs. Do not share needles. Ask your health care provider for help if you need support or information about quitting drugs. General instructions Schedule regular health, dental, and eye exams. Stay current with your vaccines. Tell your health care provider if: You often feel depressed. You have ever been abused or do not feel safe at home. Summary Adopting a healthy lifestyle and getting preventive care are important in promoting health and wellness. Follow your health care provider's instructions about healthy diet, exercising, and getting tested or screened for diseases. Follow your health care provider's instructions on monitoring your cholesterol and blood pressure. This information is not intended to replace advice given to you by your health care provider. Make sure you discuss any questions you have with your health care provider. Document Revised: 08/11/2020 Document Reviewed: 08/11/2020 Elsevier Patient Education  2023 [employer]  

## 2021-11-14 ENCOUNTER — Ambulatory Visit (INDEPENDENT_AMBULATORY_CARE_PROVIDER_SITE_OTHER): Payer: HMO

## 2021-11-14 DIAGNOSIS — Z0000 Encounter for general adult medical examination without abnormal findings: Secondary | ICD-10-CM | POA: Diagnosis not present

## 2021-11-23 DIAGNOSIS — N5201 Erectile dysfunction due to arterial insufficiency: Secondary | ICD-10-CM | POA: Diagnosis not present

## 2021-11-23 DIAGNOSIS — N35011 Post-traumatic bulbous urethral stricture: Secondary | ICD-10-CM | POA: Diagnosis not present

## 2021-12-03 ENCOUNTER — Ambulatory Visit (INDEPENDENT_AMBULATORY_CARE_PROVIDER_SITE_OTHER): Payer: HMO | Admitting: Family Medicine

## 2021-12-03 ENCOUNTER — Encounter: Payer: Self-pay | Admitting: Family Medicine

## 2021-12-03 VITALS — BP 148/84 | HR 77 | Temp 98.5°F | Ht 73.0 in | Wt 323.6 lb

## 2021-12-03 DIAGNOSIS — I10 Essential (primary) hypertension: Secondary | ICD-10-CM

## 2021-12-03 DIAGNOSIS — J069 Acute upper respiratory infection, unspecified: Secondary | ICD-10-CM | POA: Diagnosis not present

## 2021-12-03 MED ORDER — ESOMEPRAZOLE MAGNESIUM 40 MG PO CPDR: 40.0000 mg | DELAYED_RELEASE_CAPSULE | Freq: Every day | ORAL | 1 refills | Status: DC

## 2021-12-03 MED ORDER — ALBUTEROL SULFATE HFA 108 (90 BASE) MCG/ACT IN AERS: 1.0000 | INHALATION_SPRAY | Freq: Four times a day (QID) | RESPIRATORY_TRACT | 0 refills | Status: DC | PRN

## 2021-12-03 MED ORDER — HYDROCHLOROTHIAZIDE 25 MG PO TABS: 25.0000 mg | ORAL_TABLET | Freq: Every day | ORAL | 1 refills | Status: DC

## 2021-12-03 NOTE — Progress Notes (Signed)
OFFICE VISIT  12/03/2021  CC:  Chief Complaint  Patient presents with   Hypertension   Patient is a 56 y.o. male who presents for 3-week follow-up uncontrolled hypertension A/P as of last visit: "#1 uncontrolled hypertension. His blood pressure has been quite a bit uncontrolled for quite some time now. He had been resistant to increase or addition of medications in the past but he is now on board with this. Recent increase of lisinopril has not made any significant improvement. We will increase his Lopressor to 100 mg twice a day today.  He has plenty of 50 mg tabs to double up at this time. Basic metabolic panel today."  INTERIM HX: Feeling well other than some nasal congestion and postnasal drip with cough for last few days.  Hoarse voice.  No fever, no sore throat.  He has some rattly breath sounds at times and says he does sometimes feels like he is wheezing. Taking over-the-counter Benadryl regularly.  No home blood pressure monitoring.  He is taking Lopressor 100 mg twice a day and lisinopril 40 mg a day.  Past Medical History:  Diagnosis Date   B12 deficiency    Blood transfusion without reported diagnosis    Chronic low back pain    Dr. Hardin Negus is pain mgmt MD   Depression    Diabetes mellitus with complication ([place])    DPN + microalbuminuria   GERD (gastroesophageal reflux disease)    Heart murmur    History of adenomatous polyp of colon 05/22/2021   recall 7 yrs   Hypercholesterolemia    Statin intolerant.  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   Persistent asthma    Never smoker   Seizures ([place])    Stable angina ([place])    nl myoview 12/07  followed by Dr Johnsie Cancel   Statin intolerance    myalgias   Subclinical hypothyroidism 06/2017    Past Surgical History:  Procedure Laterality  Date   COLONOSCOPY  05/22/2021   Adenomas--recall 7 years   CYSTOSCOPY WITH RETROGRADE URETHROGRAM N/A 08/21/2021   Procedure: RETROGRADE URETHROGRAM;  Surgeon: Ardis Hughs, MD;  Location: [facility];  Service: Urology;  Laterality: N/A;   CYSTOSCOPY WITH URETHRAL DILATATION N/A 08/21/2021   Procedure: CYSTOSCOPY WITH URETHRAL DILATATION OPTILUME;  Surgeon: Ardis Hughs, MD;  Location: [facility];  Service: Urology;  Laterality: N/A;   LUMBAR SPINE SURGERY     lumbar laminectomy with hardware stabilization, with ensuing arachnoiditis   URETHRAL STRICTURE DILATATION  1996 and 2004.   Laser surgery.    Outpatient Medications Prior to Visit  Medication Sig Dispense Refill   Acetaminophen (TYLENOL PO) Take 500 mg by mouth daily as needed (pain).     B-D ULTRAFINE III SHORT PEN 31G X 8 MM MISC USE AS DIRECTED TWICE DAILY FOR MEDICATION INJECTION 100 each 1   blood glucose meter kit and supplies KIT Dispense based on patient and insurance preference. Use to check glucose 4 times daily. 1 each 0   Evolocumab (REPATHA SURECLICK) 295 MG/ML SOAJ Inject 1 Dose into the skin every 14 (fourteen) days. 6 mL 3   FREESTYLE TEST STRIPS test strip USE TO CHECK GLUCOSE 4 TIMES DAILY. 200 strip 4   furosemide (LASIX) 20 MG tablet TAKE 1 TABLET BY MOUTH EVERY MORNING AS  NEEDED FOR SWELLING 90 tablet 1   insulin degludec (TRESIBA FLEXTOUCH) 100 UNIT/ML FlexTouch Pen 80 U qAM and 68 U qPM 66 mL 1   lisinopril (ZESTRIL) 40 MG tablet Take 1 tablet (40 mg total) by mouth daily. 90 tablet 1   methocarbamol (ROBAXIN) 500 MG tablet Take 500 mg by mouth 2 (two) times daily as needed for muscle spasms.     metoprolol tartrate (LOPRESSOR) 50 MG tablet 2 tabs po bid 180 tablet 1   morphine (MS CONTIN) 30 MG 12 hr tablet Take 30-60 mg by mouth See admin instructions. Taking 60 mg in the AM and 30 mg in the afternoon and 60 mg at bedtime     Multiple Vitamin (MULTIVITAMIN) tablet Take 1 tablet by mouth daily.      nitroGLYCERIN (NITROSTAT) 0.4 MG SL tablet PLACE 1 TABLET UNDER THE TONGUE EVERY 5 MINUTES AS NEEDED. (Patient not taking: Reported on 10/07/2021) 25 tablet 1   venlafaxine XR (EFFEXOR-XR) 150 MG 24 hr capsule Take 150 mg by mouth 2 (two) times daily.  3   esomeprazole (NEXIUM) 40 MG capsule Take 1 capsule (40 mg total) by mouth daily. 90 capsule 1   No facility-administered medications prior to visit.    Allergies  Allergen Reactions   Ace Inhibitors     Kidney failure   Atorvastatin Other (See Comments)    Arm pain   Prednisone     REACTION: Anxiety   Septra [Sulfamethoxazole-Trimethoprim]    Metformin And Related Diarrhea    ROS As per HPI  PE:    11/05/2021    2:30 PM 10/07/2021    3:37 PM 10/07/2021    3:03 PM  Vitals with BMI  Height 6' 1"  6' 1"  Weight 328 lbs  328 lbs 6 oz  BMI 62.13  08.65  Systolic 784 696 295  Diastolic 80 80 81  Pulse 92  72   Blood pressure rechecked manually today was 148/84.  Physical Exam  Gen: Alert, well appearing.  Patient is oriented to person, place, time, and situation. AFFECT: pleasant, lucid thought and speech. CV: RRR, no m/r/g.   LUNGS: He has some expiratory rhonchi, mildly decreased aeration on exhalation.  Nonlabored resps, good aeration in all lung fields.   LABS:  Last CBC Lab Results  Component Value Date   WBC 14.6 (H) 08/19/2021   HGB 15.1 08/19/2021   HCT 48.4 08/19/2021   MCV 86.4 08/19/2021   MCH 27.0 08/19/2021   RDW 12.7 08/19/2021   PLT 380 28/41/3244   Last metabolic panel Lab Results  Component Value Date   GLUCOSE 234 (H) 11/05/2021   NA 140 11/05/2021   K 4.8 11/05/2021   CL 101 11/05/2021   CO2 32 11/05/2021   BUN 17 11/05/2021   CREATININE 0.86 11/05/2021   GFRNONAA >60 08/19/2021   CALCIUM 8.7 11/05/2021   PHOS 2.5 08/10/2012   PROT 6.8 10/07/2021   ALBUMIN 3.7 10/07/2021   BILITOT 0.4 10/07/2021   ALKPHOS 74 10/07/2021   AST 13 10/07/2021   ALT 12 10/07/2021   ANIONGAP 6 08/19/2021    IMPRESSION AND PLAN:  1)Uncontrolled hypertension. Add HCTZ 25 mg a day.  Continue Lopressor 100 mg twice daily and lisinopril 40 mg a day. Recheck basic metabolic panel when I see him back for follow-up in 2 weeks.  #2 URI with cough.  Suspect a little bit of reactive airways. Albuterol 1 to 2 puffs every 6 hours as needed, 8g inhaler,  no refill. He can continue antihistamine but knows to avoid over-the-counter decongestants.  An After Visit Summary was printed and given to the patient.  FOLLOW UP: Return in about 2 weeks (around 12/17/2021) for f/u HTN.  Signed:  Crissie Sickles, MD           12/03/2021

## 2021-12-17 ENCOUNTER — Ambulatory Visit: Payer: HMO | Admitting: Family Medicine

## 2021-12-19 ENCOUNTER — Other Ambulatory Visit: Payer: Self-pay | Admitting: Family Medicine

## 2021-12-26 ENCOUNTER — Other Ambulatory Visit: Payer: Self-pay | Admitting: Family Medicine

## 2021-12-31 ENCOUNTER — Ambulatory Visit: Payer: HMO | Admitting: Family Medicine

## 2022-02-04 ENCOUNTER — Ambulatory Visit (INDEPENDENT_AMBULATORY_CARE_PROVIDER_SITE_OTHER): Payer: HMO | Admitting: Internal Medicine

## 2022-02-04 ENCOUNTER — Encounter (HOSPITAL_BASED_OUTPATIENT_CLINIC_OR_DEPARTMENT_OTHER): Payer: Self-pay | Admitting: Internal Medicine

## 2022-02-04 VITALS — BP 174/108 | HR 117 | Ht 73.0 in | Wt 308.4 lb

## 2022-02-04 DIAGNOSIS — M791 Myalgia, unspecified site: Secondary | ICD-10-CM | POA: Diagnosis not present

## 2022-02-04 DIAGNOSIS — E785 Hyperlipidemia, unspecified: Secondary | ICD-10-CM

## 2022-02-04 DIAGNOSIS — T466X5D Adverse effect of antihyperlipidemic and antiarteriosclerotic drugs, subsequent encounter: Secondary | ICD-10-CM

## 2022-02-04 DIAGNOSIS — Z8639 Personal history of other endocrine, nutritional and metabolic disease: Secondary | ICD-10-CM

## 2022-02-04 NOTE — Progress Notes (Signed)
LIPID CLINIC CONSULT NOTE  Chief Complaint:  Manage dyslipidemia  Primary Care Physician: Tammi Sou, MD  Primary Cardiologist:  None  HPI:  Dylan Owen is a 56 y.o. male who is being seen today for the evaluation of dyslipidemia at the request of McGowen, Adrian Blackwater, MD. this is a pleasant 56 year old male kindly referred for evaluation management of dyslipidemia.  He has a history of dyslipidemia with intolerance to atorvastatin causing myalgias.  He was advised to try other statins but declined having had significant arm pain.  He also has a peripheral neuropathy and chronic back issues and walks with a cane.  Other medical problems include type 2 diabetes and hypertension, obstructive sleep apnea and other multiple medical problems.  He is on insulin.  He was referred for possible PCSK9 inhibitor therapy.  Recent lipid profile showed total cholesterol 189, HDL 48, triglycerides 120 and LDL 117.  02/04/2022  Dylan Owen returns today for follow-up of his dyslipidemia.  He did have recent repeat labs which showed an elevated LDL particle #1403, LDL-C of 106, HDL-C was 40, triglycerides 92.  He remains on Repatha monotherapy.  He has been statin intolerant.  He was noted to be tachycardic today.  EKG was performed which showed a sinus tachycardia at 117.  PMHx:  Past Medical History:  Diagnosis Date   B12 deficiency    Blood transfusion without reported diagnosis    Chronic low back pain    Dr. Hardin Negus is pain mgmt MD   Depression    Diabetes mellitus with complication ([place])    DPN + microalbuminuria   GERD (gastroesophageal reflux disease)    Heart murmur    History of adenomatous polyp of colon 05/22/2021   recall 7 yrs   Hypercholesterolemia    Statin intolerant.  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 + chronic  lumbar radiculopathy   Persistent asthma    Never smoker   Seizures ([place])    Stable angina    nl myoview 12/07  followed by Dr Johnsie Cancel   Statin intolerance    myalgias   Subclinical hypothyroidism 06/2017    Past Surgical History:  Procedure Laterality Date   COLONOSCOPY  05/22/2021   Adenomas--recall 7 years   CYSTOSCOPY WITH RETROGRADE URETHROGRAM N/A 08/21/2021   Procedure: RETROGRADE URETHROGRAM;  Surgeon: Ardis Hughs, MD;  [facility];  Service: Urology;  Laterality: N/A;   CYSTOSCOPY WITH URETHRAL DILATATION N/A 08/21/2021   Procedure: CYSTOSCOPY WITH URETHRAL DILATATION OPTILUME;  Surgeon: Ardis Hughs, MD;  [facility];  Service: Urology;  Laterality: N/A;   LUMBAR SPINE SURGERY     lumbar laminectomy with hardware stabilization, with ensuing arachnoiditis   URETHRAL STRICTURE DILATATION  1996 and 2004.   Laser surgery.    FAMHx:  Family History  Problem Relation Age of Onset   Cancer Paternal Grandfather        prostate   Heart disease Other    Hypertension Other    Other Other        Emotional Illness   Colon cancer Neg Hx    Esophageal cancer Neg Hx    Rectal cancer Neg Hx    Stomach cancer Neg Hx     SOCHx:   reports that he has never smoked. His smokeless tobacco use  includes chew. He reports that he does not drink alcohol and does not use drugs.  ALLERGIES:  Allergies  Allergen Reactions   Ace Inhibitors     Kidney failure   Atorvastatin Other (See Comments)    Arm pain   Prednisone     REACTION: Anxiety   Septra [Sulfamethoxazole-Trimethoprim]    Metformin And Related Diarrhea    ROS: Pertinent items noted in HPI and remainder of comprehensive ROS otherwise negative.  HOME MEDS: Current Outpatient Medications on File Prior to Visit  Medication Sig Dispense Refill   Acetaminophen (TYLENOL PO) Take 500 mg by mouth daily as needed (pain).     albuterol (VENTOLIN HFA) 108 (90 Base) MCG/ACT inhaler INHALE 1-2 PUFFS BY  MOUTH EVERY 6 HOURS AS NEEDED FOR WHEEZE OR SHORTNESS OF BREATH 18 each 0   B-D ULTRAFINE III SHORT PEN 31G X 8 MM MISC USE AS DIRECTED TWICE DAILY FOR MEDICATION INJECTION 100 each 1   blood glucose meter kit and supplies KIT Dispense based on patient and insurance preference. Use to check glucose 4 times daily. 1 each 0   esomeprazole (NEXIUM) 40 MG capsule Take 1 capsule (40 mg total) by mouth daily. 90 capsule 1   Evolocumab (REPATHA SURECLICK) 371 MG/ML SOAJ Inject 1 Dose into the skin every 14 (fourteen) days. 6 mL 3   FREESTYLE TEST STRIPS test strip USE TO CHECK GLUCOSE 4 TIMES DAILY. 200 strip 4   furosemide (LASIX) 20 MG tablet TAKE 1 TABLET BY MOUTH EVERY MORNING AS NEEDED FOR SWELLING 90 tablet 1   hydrochlorothiazide (HYDRODIURIL) 25 MG tablet Take 1 tablet (25 mg total) by mouth daily. 30 tablet 1   insulin degludec (TRESIBA FLEXTOUCH) 100 UNIT/ML FlexTouch Pen 80 U qAM and 68 U qPM 66 mL 1   lisinopril (ZESTRIL) 40 MG tablet Take 1 tablet (40 mg total) by mouth daily. 90 tablet 1   methocarbamol (ROBAXIN) 500 MG tablet Take 500 mg by mouth 2 (two) times daily as needed for muscle spasms.     metoprolol tartrate (LOPRESSOR) 50 MG tablet 2 tabs po bid 180 tablet 1   morphine (MS CONTIN) 30 MG 12 hr tablet Take 30-60 mg by mouth See admin instructions. Taking 60 mg in the AM and 30 mg in the afternoon and 60 mg at bedtime     Multiple Vitamin (MULTIVITAMIN) tablet Take 1 tablet by mouth daily.     nitroGLYCERIN (NITROSTAT) 0.4 MG SL tablet PLACE 1 TABLET UNDER THE TONGUE EVERY 5 MINUTES AS NEEDED. 25 tablet 1   venlafaxine XR (EFFEXOR-XR) 150 MG 24 hr capsule Take 150 mg by mouth 2 (two) times daily.  3   No current facility-administered medications on file prior to visit.    LABS/IMAGING: No results found for this or any previous visit (from the past 48 hour(s)). No results found.  LIPID PANEL:    Component Value Date/Time   CHOL 189 04/01/2021 1446   TRIG 120.0 04/01/2021  1446   HDL 48.20 04/01/2021 1446   CHOLHDL 4 04/01/2021 1446   VLDL 24.0 04/01/2021 1446   LDLCALC 117 (H) 04/01/2021 1446   LDLCALC 95 10/18/2019 1428   LDLDIRECT 166.2 11/09/2013 1501    WEIGHTS: Wt Readings from Last 3 Encounters:  02/04/22 (!) 308 lb 6.4 oz (139.9 kg)  12/03/21 (!) 323 lb 9.6 oz (146.8 kg)  11/05/21 (!) 328 lb (148.8 kg)    VITALS: BP (!) 174/108 (BP Location: Left Arm, Patient Position: Sitting, Cuff Size:  Large)   Pulse (!) 117   Ht _0  (1.854 m)   Wt (!) 308 lb 6.4 oz (139.9 kg)   BMI 40.69 kg/m   EXAM: Deferred  EKG: Deferred  ASSESSMENT: Mixed dyslipidemia, goal LDL less than 70 Type 2 diabetes on insulin Peripheral neuropathy Statin myalgias Hypertension Morbid obesity Obstructive sleep apnea  PLAN: 1.   Dylan Owen continues to have cholesterol that is higher than target.  I recommended adding ezetimibe to his Repatha to see if we can reach target.  He feels that he would like to repeat lipids first.  We will go ahead and order an NMR with a plan follow-up in 6 months.  No medication adjustments were made today.  Pixie Casino, MD, [facility] [place] Director of the Advanced Lipid Disorders &  Cardiovascular Risk Reduction Clinic Diplomate of the American Board of Clinical Lipidology Attending Cardiologist  Direct Dial: [phone]  Fax: [phone]  Website:  [email or web address] Hallas 02/04/2022, 3:20 PM

## 2022-02-04 NOTE — Patient Instructions (Signed)
Medication Instructions:  Your physician recommends that you continue on your current medications as directed. Please refer to the Current Medication list given to you today.   *If you need a refill on your cardiac medications before your next appointment, please call your pharmacy*  Lab Work: FASTING NMR SOON   If you have labs (blood work) drawn today and your tests are completely normal, you will receive your results only by: [place] (if you have MyChart) OR A paper copy in the mail If you have any lab test that is abnormal or we need to change your treatment, we will call you to review the results.  Testing/Procedures: NONE  Follow-Up: At [facility], you and your health needs are our priority.  As part of our continuing mission to provide you with exceptional heart care, we have created designated Provider Care Teams.  These Care Teams include your primary Cardiologist (physician) and Advanced Practice Providers (APPs -  Physician Assistants and Nurse Practitioners) who all work together to provide you with the care you need, when you need it.  We recommend signing up for the patient portal called "MyChart".  Sign up information is provided on this After Visit Summary.  MyChart is used to connect with patients for Virtual Visits (Telemedicine).  Patients are able to view lab/test results, encounter notes, upcoming appointments, etc.  Non-urgent messages can be sent to your provider as well.   To learn more about what you can do with MyChart, go to [email or web address].    Your next appointment:   6 month(s)  The format for your next appointment:   In Person  Provider:   K. Mali Hilty, MD  AT [place]

## 2022-02-07 ENCOUNTER — Encounter (HOSPITAL_BASED_OUTPATIENT_CLINIC_OR_DEPARTMENT_OTHER): Payer: Self-pay | Admitting: Internal Medicine

## 2022-04-16 ENCOUNTER — Other Ambulatory Visit: Payer: Self-pay | Admitting: Family Medicine

## 2022-04-19 ENCOUNTER — Other Ambulatory Visit: Payer: Self-pay | Admitting: Family Medicine

## 2022-05-13 ENCOUNTER — Other Ambulatory Visit: Payer: Self-pay | Admitting: Family Medicine

## 2022-05-17 ENCOUNTER — Other Ambulatory Visit: Payer: Self-pay | Admitting: Family Medicine

## 2022-05-18 DIAGNOSIS — M961 Postlaminectomy syndrome, not elsewhere classified: Secondary | ICD-10-CM | POA: Diagnosis not present

## 2022-05-18 DIAGNOSIS — G894 Chronic pain syndrome: Secondary | ICD-10-CM | POA: Diagnosis not present

## 2022-05-18 DIAGNOSIS — E1142 Type 2 diabetes mellitus with diabetic polyneuropathy: Secondary | ICD-10-CM | POA: Diagnosis not present

## 2022-05-18 DIAGNOSIS — Z79891 Long term (current) use of opiate analgesic: Secondary | ICD-10-CM | POA: Diagnosis not present

## 2022-05-22 ENCOUNTER — Other Ambulatory Visit: Payer: Self-pay | Admitting: Family Medicine

## 2022-05-23 ENCOUNTER — Other Ambulatory Visit: Payer: Self-pay | Admitting: Family Medicine

## 2022-05-24 NOTE — Telephone Encounter (Signed)
LVM for pt regarding medication.

## 2022-06-03 ENCOUNTER — Other Ambulatory Visit: Payer: Self-pay | Admitting: Family Medicine

## 2022-06-09 ENCOUNTER — Other Ambulatory Visit: Payer: Self-pay | Admitting: Family Medicine

## 2022-06-09 NOTE — Telephone Encounter (Signed)
LVM for pt regarding refill. Pt is due for appointment to receive further refills.

## 2022-06-17 ENCOUNTER — Other Ambulatory Visit: Payer: Self-pay | Admitting: Family Medicine

## 2022-06-17 NOTE — Telephone Encounter (Signed)
Unable to reach patient, spoke with wife advising that patient is currently due for appointment. She will have him call back to complete this.

## 2022-07-13 DIAGNOSIS — E1142 Type 2 diabetes mellitus with diabetic polyneuropathy: Secondary | ICD-10-CM | POA: Diagnosis not present

## 2022-07-13 DIAGNOSIS — G894 Chronic pain syndrome: Secondary | ICD-10-CM | POA: Diagnosis not present

## 2022-07-13 DIAGNOSIS — M961 Postlaminectomy syndrome, not elsewhere classified: Secondary | ICD-10-CM | POA: Diagnosis not present

## 2022-07-13 DIAGNOSIS — Z79891 Long term (current) use of opiate analgesic: Secondary | ICD-10-CM | POA: Diagnosis not present

## 2022-08-05 ENCOUNTER — Encounter: Payer: Self-pay | Admitting: Family Medicine

## 2022-08-05 ENCOUNTER — Ambulatory Visit (INDEPENDENT_AMBULATORY_CARE_PROVIDER_SITE_OTHER): Payer: HMO | Admitting: Family Medicine

## 2022-08-05 VITALS — BP 140/80 | HR 97 | Wt 325.4 lb

## 2022-08-05 DIAGNOSIS — Z0000 Encounter for general adult medical examination without abnormal findings: Secondary | ICD-10-CM

## 2022-08-05 DIAGNOSIS — Z125 Encounter for screening for malignant neoplasm of prostate: Secondary | ICD-10-CM

## 2022-08-05 DIAGNOSIS — E782 Mixed hyperlipidemia: Secondary | ICD-10-CM | POA: Diagnosis not present

## 2022-08-05 DIAGNOSIS — Z794 Long term (current) use of insulin: Secondary | ICD-10-CM

## 2022-08-05 DIAGNOSIS — I10 Essential (primary) hypertension: Secondary | ICD-10-CM | POA: Diagnosis not present

## 2022-08-05 DIAGNOSIS — E1140 Type 2 diabetes mellitus with diabetic neuropathy, unspecified: Secondary | ICD-10-CM | POA: Diagnosis not present

## 2022-08-05 LAB — POCT GLYCOSYLATED HEMOGLOBIN (HGB A1C)
HbA1c POC (<> result, manual entry): 8.1 % (ref 4.0–5.6)
HbA1c, POC (controlled diabetic range): 8.1 % — AB (ref 0.0–7.0)
HbA1c, POC (prediabetic range): 8.1 % — AB (ref 5.7–6.4)
Hemoglobin A1C: 8.1 % — AB (ref 4.0–5.6)

## 2022-08-05 MED ORDER — METOPROLOL TARTRATE 50 MG PO TABS: ORAL_TABLET | ORAL | 1 refills | Status: DC

## 2022-08-05 MED ORDER — HYDROCHLOROTHIAZIDE 25 MG PO TABS: 25.0000 mg | ORAL_TABLET | Freq: Every day | ORAL | 1 refills | Status: DC

## 2022-08-05 MED ORDER — LISINOPRIL 40 MG PO TABS: 40.0000 mg | ORAL_TABLET | Freq: Every day | ORAL | 1 refills | Status: DC

## 2022-08-05 MED ORDER — TRESIBA FLEXTOUCH 100 UNIT/ML ~~LOC~~ SOPN: PEN_INJECTOR | SUBCUTANEOUS | 1 refills | Status: DC

## 2022-08-05 NOTE — Patient Instructions (Signed)

## 2022-08-05 NOTE — Progress Notes (Signed)
Office Note 08/05/2022  CC:  Chief Complaint  Patient presents with   Medication Refill    Refill on meds. No other questions or concerns.    HPI:  Patient is a 57 y.o. male who is here for annual health maintenance exam and follow-up diabetes, hypertension, and hyperlipidemia.  Winn is doing well. He had about of hypoglycemia since I last saw him.  This scared him pretty bad so for a while he did not take his insulin.  He estimates it was about 2 weeks without insulin.  Once he restarted it he eventually got to a lower dose that he had previously been on: 72 units of Tresiba every morning and 68 every afternoon.  Only occasional blood pressure monitoring at home.  He seems to remember that it is usually near normal.  Emphasizes that it is always high in the clinic after he walks and and goes down at the end of visit.  He saw Dr. Rennis Golden for his lipids November last year. He recommended adding Zetia to the Repatha but Marylynn Pearson wanted to recheck labs first.  It appears the NMR lipid profile is ordered and active but has not been done--not clear.  ROS as above, plus--> no fevers, no CP, no SOB, no wheezing, no cough, no dizziness, no HAs, no rashes, no melena/hematochezia.  No polyuria or polydipsia.  No myalgias or arthralgias.  No focal weakness or tremors.  No acute vision or hearing abnormalities.  No dysuria or unusual/new urinary urgency or frequency.  No recent changes in lower legs--> he has chronic numbness bilaterally. No n/v/d or abd pain.  No palpitations.     Past Medical History:  Diagnosis Date   B12 deficiency    Blood transfusion without reported diagnosis    Chronic low back pain    Dr. Vear Clock is pain mgmt MD   Depression    Diabetes mellitus with complication (HCC)    DPN + microalbuminuria   GERD (gastroesophageal reflux disease)    Heart murmur    History of adenomatous polyp of colon 05/22/2021   recall 7 yrs   Hypercholesterolemia    Statin intolerant.   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   Persistent asthma    Never smoker   Seizures (HCC)    Stable angina    nl myoview 12/07  followed by Dr Eden Emms   Statin intolerance    myalgias   Subclinical hypothyroidism 06/2017    Past Surgical History:  Procedure Laterality Date   COLONOSCOPY  05/22/2021   Adenomas--recall 7 years   CYSTOSCOPY WITH RETROGRADE URETHROGRAM N/A 08/21/2021   Procedure: RETROGRADE URETHROGRAM;  Surgeon: Crist Fat, MD;  Location: [facility];  Service: Urology;  Laterality: N/A;   CYSTOSCOPY WITH URETHRAL DILATATION N/A 08/21/2021   Procedure: CYSTOSCOPY WITH URETHRAL DILATATION OPTILUME;  Surgeon: Crist Fat, MD;  Location: [facility];  Service: Urology;  Laterality: N/A;   LUMBAR SPINE SURGERY     lumbar laminectomy with hardware stabilization, with ensuing arachnoiditis   URETHRAL STRICTURE DILATATION  1996 and 2004.   Laser surgery.    Family History  Problem Relation Age of Onset   Cancer Paternal Grandfather        prostate   Heart disease Other    Hypertension Other  Other Other        Emotional Illness   Colon cancer Neg Hx    Esophageal cancer Neg Hx    Rectal cancer Neg Hx    Stomach cancer Neg Hx     Social History   Socioeconomic History   Marital status: Married    Spouse name: Not on file   Number of children: Not on file   Years of education: Not on file   Highest education level: Not on file  Occupational History   Not on file  Tobacco Use   Smoking status: Never   Smokeless tobacco: Current    Types: Chew   Tobacco comments:    dip  Vaping Use   Vaping Use: Never used  Substance and Sexual Activity   Alcohol use: No   Drug use: No   Sexual activity: Yes  Other Topics Concern   Not on file  Social History Narrative   Married, 4  children.   Occupation: disabled since 2000.     Prior to disability he worked for [employer].   Orig from [place].   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  No hx of drug abuse.   Chews tobacco since age 53 yrs.   Regular exercise:  No               Social Determinants of Health   Financial Resource Strain: Low Risk  (11/11/2021)   Overall Financial Resource Strain (CARDIA)    Difficulty of Paying Living Expenses: Not hard at all  Food Insecurity: No Food Insecurity (11/11/2021)   Hunger Vital Sign    Worried About Running Out of Food in the Last Year: Never true    Ran Out of Food in the Last Year: Never true  Transportation Needs: No Transportation Needs (11/11/2021)   PRAPARE - Administrator, Civil Service (Medical): No    Lack of Transportation (Non-Medical): No  Physical Activity: Inactive (11/11/2021)   Exercise Vital Sign    Days of Exercise per Week: 0 days    Minutes of Exercise per Session: 0 min  Stress: No Stress Concern Present (11/11/2021)   [employer] of Occupational Health - Occupational Stress Questionnaire    Feeling of Stress : Only a little  Social Connections: Moderately Isolated (11/11/2021)   Social Connection and Isolation Panel [NHANES]    Frequency of Communication with Friends and Family: Three times a week    Frequency of Social Gatherings with Friends and Family: Twice a week    Attends Religious Services: Never    Database administrator or Organizations: No    Attends Banker Meetings: Never    Marital Status: Married  Catering manager Violence: Not At Risk (11/11/2021)   Humiliation, Afraid, Rape, and Kick questionnaire    Fear of Current or Ex-Partner: No    Emotionally Abused: No    Physically Abused: No    Sexually Abused: No    Outpatient Medications Prior to Visit  Medication Sig Dispense Refill   Acetaminophen (TYLENOL PO) Take 500 mg by  mouth daily as needed (pain).     albuterol (VENTOLIN HFA) 108 (90 Base) MCG/ACT inhaler INHALE 1-2 PUFFS BY MOUTH EVERY 6 HOURS AS NEEDED FOR WHEEZE OR SHORTNESS OF BREATH 18 each 0   B-D ULTRAFINE III SHORT PEN 31G X 8 MM MISC USE AS DIRECTED TWICE DAILY FOR MEDICATION INJECTION  100 each 1   blood glucose meter kit and supplies KIT Dispense based on patient and insurance preference. Use to check glucose 4 times daily. 1 each 0   esomeprazole (NEXIUM) 40 MG capsule Take 1 capsule (40 mg total) by mouth daily. 90 capsule 1   Evolocumab (REPATHA SURECLICK) 140 MG/ML SOAJ Inject 1 Dose into the skin every 14 (fourteen) days. 6 mL 3   FREESTYLE TEST STRIPS test strip USE TO CHECK GLUCOSE 4 TIMES DAILY. 200 strip 4   furosemide (LASIX) 20 MG tablet TAKE 1 TABLET BY MOUTH EVERY MORNING AS NEEDED FOR SWELLING 30 tablet 0   hydrochlorothiazide (HYDRODIURIL) 25 MG tablet Take 1 tablet (25 mg total) by mouth daily. 30 tablet 1   insulin degludec (TRESIBA FLEXTOUCH) 100 UNIT/ML FlexTouch Pen 80 U qAM and 68 U qPM 66 mL 1   lisinopril (ZESTRIL) 40 MG tablet TAKE 1 TABLET BY MOUTH EVERY DAY 30 tablet 0   methocarbamol (ROBAXIN) 500 MG tablet Take 500 mg by mouth 2 (two) times daily as needed for muscle spasms.     metoprolol tartrate (LOPRESSOR) 50 MG tablet TAKE 1 TABLET BY MOUTH TWICE A DAY. OFFICE VISIT NEEDED FOR FURTHER REFILLS 60 tablet 0   morphine (MS CONTIN) 30 MG 12 hr tablet Take 30-60 mg by mouth See admin instructions. Taking 60 mg in the AM and 30 mg in the afternoon and 60 mg at bedtime     Multiple Vitamin (MULTIVITAMIN) tablet Take 1 tablet by mouth daily.     nitroGLYCERIN (NITROSTAT) 0.4 MG SL tablet PLACE 1 TABLET UNDER THE TONGUE EVERY 5 MINUTES AS NEEDED. 25 tablet 1   venlafaxine XR (EFFEXOR-XR) 150 MG 24 hr capsule Take 150 mg by mouth 2 (two) times daily.  3   No facility-administered medications prior to visit.    Allergies  Allergen Reactions   Ace Inhibitors     Kidney failure    Atorvastatin Other (See Comments)    Arm pain   Prednisone     REACTION: Anxiety   Septra [Sulfamethoxazole-Trimethoprim]    Metformin And Related Diarrhea    Review of Systems  Constitutional:  Negative for appetite change, chills, fatigue and fever.  HENT:  Negative for congestion, dental problem, ear pain and sore throat.   Eyes:  Negative for discharge, redness and visual disturbance.  Respiratory:  Negative for cough, chest tightness, shortness of breath and wheezing.   Cardiovascular:  Negative for chest pain, palpitations and leg swelling.  Gastrointestinal:  Negative for abdominal pain, blood in stool, diarrhea, nausea and vomiting.  Genitourinary:  Negative for difficulty urinating, dysuria, flank pain, frequency, hematuria and urgency.  Musculoskeletal:  Negative for arthralgias, back pain, joint swelling, myalgias and neck stiffness.  Skin:  Negative for pallor and rash.  Neurological:  Negative for dizziness, speech difficulty, weakness and headaches.  Hematological:  Negative for adenopathy. Does not bruise/bleed easily.  Psychiatric/Behavioral:  Negative for confusion and sleep disturbance. The patient is not nervous/anxious.     PE;    08/05/2022    1:45 PM 02/04/2022    2:39 PM 12/03/2021    3:23 PM  Vitals with BMI  Height  6\' 1"    Weight 325 lbs 6 oz 308 lbs 6 oz   BMI  40.7   Systolic 174 174 387  Diastolic 93 108 84  Pulse 97 117    Initial bp 174/93 Rpt was 140/80  Gen: Alert, well appearing.  Patient is oriented to person, place,  time, and situation. AFFECT: pleasant, lucid thought and speech. ENT: Ears: EACs clear, normal epithelium.  TMs with good light reflex and landmarks bilaterally.  Eyes: no injection, icteris, swelling, or exudate.  EOMI, PERRLA. Nose: no drainage or turbinate edema/swelling.  No injection or focal lesion.  Mouth: lips without lesion/swelling.  Oral mucosa pink and moist.  Dentition intact and without obvious caries or gingival  swelling.  Oropharynx without erythema, exudate, or swelling.  Neck: supple/nontender.  No LAD, mass, or TM.  Carotid pulses 2+ bilaterally, without bruits. CV: RRR, no m/r/g.   LUNGS: CTA bilat, nonlabored resps, good aeration in all lung fields. ABD: soft, NT, ND, BS normal.  No hepatospenomegaly or mass.  No bruits. EXT: no clubbing, cyanosis, or edema.  Musculoskeletal: no joint swelling, erythema, warmth, or tenderness.  ROM of all joints intact. Skin - no sores or suspicious lesions or rashes or color changes  Pertinent labs:  Lab Results  Component Value Date   TSH 1.84 09/12/2020   Lab Results  Component Value Date   WBC 14.6 (H) 08/19/2021   HGB 15.1 08/19/2021   HCT 48.4 08/19/2021   MCV 86.4 08/19/2021   PLT 380 08/19/2021   Lab Results  Component Value Date   CREATININE 0.86 11/05/2021   BUN 17 11/05/2021   NA 140 11/05/2021   K 4.8 11/05/2021   CL 101 11/05/2021   CO2 32 11/05/2021   Lab Results  Component Value Date   ALT 12 10/07/2021   AST 13 10/07/2021   ALKPHOS 74 10/07/2021   BILITOT 0.4 10/07/2021   Lab Results  Component Value Date   CHOL 189 04/01/2021   Lab Results  Component Value Date   HDL 48.20 04/01/2021   Lab Results  Component Value Date   LDLCALC 117 (H) 04/01/2021   Lab Results  Component Value Date   TRIG 120.0 04/01/2021   Lab Results  Component Value Date   CHOLHDL 4 04/01/2021   Lab Results  Component Value Date   PSA 0.15 09/12/2020   PSA 0.2 10/18/2019   PSA 0.27 06/07/2017   Lab Results  Component Value Date   HGBA1C 8.1 (A) 08/05/2022   HGBA1C 8.1 08/05/2022   HGBA1C 8.1 (A) 08/05/2022   HGBA1C 8.1 (A) 08/05/2022   ASSESSMENT AND PLAN:   #1 health maintenance exam: Reviewed age and gender appropriate health maintenance issues (prudent diet, regular exercise, health risks of tobacco and excessive alcohol, use of seatbelts, fire alarms in home, use of sunscreen).  Also reviewed age and gender appropriate  health screening as well as vaccine recommendations. Vaccines: UTD Labs: HP labs + Hba1c, urine microalb/cr, and PSA. Prostate ca screening: PSA today. Colon ca screening: 05/2021 polyp-->recall 19yrs.  #2 diabetes with neuropathy. Control is not ideal but fair and stable.  POC Hba1c today is 8.1% Due to past scary episodes of hypoglycemia I think our goal A1c should be around 8%. Urine microalbumin/creatinine today.  3.  Hypertension, significant whitecoat component. It has been tough to get him to go up on blood pressure medication in the past. His second blood pressure here today was better, 140/80. Will continue HCTZ 25 mg a day, lisinopril 40 mg a day, and Lopressor 50 mg twice a day. Electrolytes and creatinine today.   An After Visit Summary was printed and given to the patient.  FOLLOW UP:  No follow-ups on file.  Signed:  Santiago Bumpers, MD           08/05/2022

## 2022-08-06 LAB — COMPREHENSIVE METABOLIC PANEL
ALT: 16 U/L (ref 0–53)
AST: 15 U/L (ref 0–37)
Albumin: 3.6 g/dL (ref 3.5–5.2)
Alkaline Phosphatase: 84 U/L (ref 39–117)
BUN: 12 mg/dL (ref 6–23)
CO2: 29 mEq/L (ref 19–32)
Calcium: 9.1 mg/dL (ref 8.4–10.5)
Chloride: 100 mEq/L (ref 96–112)
Creatinine, Ser: 0.83 mg/dL (ref 0.40–1.50)
GFR: 98 mL/min (ref >60.00)
Glucose, Bld: 158 mg/dL — ABNORMAL HIGH (ref 70–99)
Potassium: 4.7 mEq/L (ref 3.5–5.1)
Sodium: 138 mEq/L (ref 135–145)
Total Bilirubin: 0.6 mg/dL (ref 0.2–1.2)
Total Protein: 7.4 g/dL (ref 6.0–8.3)

## 2022-08-06 LAB — LIPID PANEL
Cholesterol: 155 mg/dL (ref 0–200)
HDL: 44.2 mg/dL (ref >39.00)
LDL Cholesterol: 96 mg/dL (ref 0–99)
NonHDL: 110.39
Total CHOL/HDL Ratio: 3
Triglycerides: 71 mg/dL (ref 0.0–149.0)
VLDL: 14.2 mg/dL (ref 0.0–40.0)

## 2022-08-06 LAB — CBC
HCT: 45 % (ref 39.0–52.0)
Hemoglobin: 14.5 g/dL (ref 13.0–17.0)
MCHC: 32.3 g/dL (ref 30.0–36.0)
MCV: 84.9 fl (ref 78.0–100.0)
Platelets: 355 10*3/uL (ref 150.0–400.0)
RBC: 5.3 Mil/uL (ref 4.22–5.81)
RDW: 13.7 % (ref 11.5–15.5)
WBC: 16 10*3/uL — ABNORMAL HIGH (ref 4.0–10.5)

## 2022-08-06 LAB — PSA: PSA: 0.28 ng/mL (ref 0.10–4.00)

## 2022-09-07 DIAGNOSIS — Z79891 Long term (current) use of opiate analgesic: Secondary | ICD-10-CM | POA: Diagnosis not present

## 2022-09-07 DIAGNOSIS — G894 Chronic pain syndrome: Secondary | ICD-10-CM | POA: Diagnosis not present

## 2022-09-07 DIAGNOSIS — M961 Postlaminectomy syndrome, not elsewhere classified: Secondary | ICD-10-CM | POA: Diagnosis not present

## 2022-09-07 DIAGNOSIS — E1142 Type 2 diabetes mellitus with diabetic polyneuropathy: Secondary | ICD-10-CM | POA: Diagnosis not present

## 2022-09-21 ENCOUNTER — Other Ambulatory Visit: Payer: Self-pay | Admitting: Internal Medicine

## 2022-11-04 DIAGNOSIS — M961 Postlaminectomy syndrome, not elsewhere classified: Secondary | ICD-10-CM | POA: Diagnosis not present

## 2022-11-04 DIAGNOSIS — G894 Chronic pain syndrome: Secondary | ICD-10-CM | POA: Diagnosis not present

## 2022-11-04 DIAGNOSIS — E1142 Type 2 diabetes mellitus with diabetic polyneuropathy: Secondary | ICD-10-CM | POA: Diagnosis not present

## 2022-11-04 DIAGNOSIS — Z79891 Long term (current) use of opiate analgesic: Secondary | ICD-10-CM | POA: Diagnosis not present

## 2022-11-05 ENCOUNTER — Encounter: Payer: Self-pay | Admitting: Family Medicine

## 2022-11-05 ENCOUNTER — Ambulatory Visit (INDEPENDENT_AMBULATORY_CARE_PROVIDER_SITE_OTHER): Payer: PPO | Admitting: Family Medicine

## 2022-11-05 ENCOUNTER — Other Ambulatory Visit: Payer: Self-pay

## 2022-11-05 VITALS — BP 156/87 | HR 77 | Temp 97.9°F | Wt 333.0 lb

## 2022-11-05 DIAGNOSIS — Z794 Long term (current) use of insulin: Secondary | ICD-10-CM | POA: Diagnosis not present

## 2022-11-05 DIAGNOSIS — Z7985 Long-term (current) use of injectable non-insulin antidiabetic drugs: Secondary | ICD-10-CM | POA: Diagnosis not present

## 2022-11-05 DIAGNOSIS — I10 Essential (primary) hypertension: Secondary | ICD-10-CM

## 2022-11-05 DIAGNOSIS — E1142 Type 2 diabetes mellitus with diabetic polyneuropathy: Secondary | ICD-10-CM

## 2022-11-05 DIAGNOSIS — D72829 Elevated white blood cell count, unspecified: Secondary | ICD-10-CM | POA: Diagnosis not present

## 2022-11-05 DIAGNOSIS — E118 Type 2 diabetes mellitus with unspecified complications: Secondary | ICD-10-CM | POA: Diagnosis not present

## 2022-11-05 LAB — CBC WITH DIFFERENTIAL/PLATELET
Absolute Monocytes: 1122 cells/uL — ABNORMAL HIGH (ref 200–950)
Basophils Absolute: 66 cells/uL (ref 0–200)
Basophils Relative: 0.5 %
Eosinophils Absolute: 185 cells/uL (ref 15–500)
Eosinophils Relative: 1.4 %
HCT: 45.8 % (ref 38.5–50.0)
Hemoglobin: 14.9 g/dL (ref 13.2–17.1)
Lymphs Abs: 1835 cells/uL (ref 850–3900)
MCH: 27.3 pg (ref 27.0–33.0)
MCHC: 32.5 g/dL (ref 32.0–36.0)
MCV: 84 fL (ref 80.0–100.0)
MPV: 9.4 fL (ref 7.5–12.5)
Monocytes Relative: 8.5 %
Neutro Abs: 9992 cells/uL — ABNORMAL HIGH (ref 1500–7800)
Neutrophils Relative %: 75.7 %
Platelets: 370 10*3/uL (ref 140–400)
RBC: 5.45 10*6/uL (ref 4.20–5.80)
RDW: 12.6 % (ref 11.0–15.0)
Total Lymphocyte: 13.9 %
WBC: 13.2 10*3/uL — ABNORMAL HIGH (ref 3.8–10.8)

## 2022-11-05 LAB — POCT GLYCOSYLATED HEMOGLOBIN (HGB A1C)
HbA1c POC (<> result, manual entry): 9.1 % (ref 4.0–5.6)
HbA1c, POC (controlled diabetic range): 9.1 % — AB (ref 0.0–7.0)
HbA1c, POC (prediabetic range): 9.1 % — AB (ref 5.7–6.4)
Hemoglobin A1C: 9.1 % — AB (ref 4.0–5.6)

## 2022-11-05 MED ORDER — LISINOPRIL 40 MG PO TABS: 40.0000 mg | ORAL_TABLET | Freq: Every day | ORAL | 1 refills | Status: DC

## 2022-11-05 MED ORDER — NOVOLOG FLEXPEN 100 UNIT/ML ~~LOC~~ SOPN: PEN_INJECTOR | SUBCUTANEOUS | 1 refills | Status: DC

## 2022-11-05 MED ORDER — HYDROCHLOROTHIAZIDE 25 MG PO TABS
25.0000 mg | ORAL_TABLET | Freq: Every day | ORAL | 1 refills | Status: DC
Start: 2022-11-05 — End: 2023-05-29

## 2022-11-05 MED ORDER — ESOMEPRAZOLE MAGNESIUM 40 MG PO CPDR: 40.0000 mg | DELAYED_RELEASE_CAPSULE | Freq: Every day | ORAL | 1 refills | Status: DC

## 2022-11-05 NOTE — Progress Notes (Signed)
OFFICE VISIT  11/05/2022  CC:  Chief Complaint  Patient presents with   Follow-up    3 month Rci. Advised pt that we will have staff here on 8/6 and 9/17 to do eye exams    Patient is a 57 y.o. male who presents for 3 mo f/u DM and HTN. A/P as of last visit: "1 diabetes with neuropathy. Control is not ideal but fair and stable.  POC Hba1c today is 8.1% Due to past scary episodes of hypoglycemia I think our goal A1c should be around 8%. Urine microalbumin/creatinine today.   2. Hypertension, significant whitecoat component. It has been tough to get him to go up on blood pressure medication in the past. His second blood pressure here today was better, 140/80. Will continue HCTZ 25 mg a day, lisinopril 40 mg a day, and Lopressor 50 mg twice a day. Electrolytes and creatinine today."  INTERIM HX: Dylan Owen feels well. Fasting glucoses typically in the low 100s.  He occasionally has measurements down in the 50s, though.  This is always when fasting.  He says it happens less than once a week. Late in the afternoon and in the late evenings his sugars shoot up to around 300 consistently.  He is currently giving 80 units of Tresiba every morning and 72 units every afternoon.  Checks home blood pressure occasionally and states it is consistently "near normal".  ROS as above, plus--> no fevers, no CP, no SOB, no wheezing, no cough, no dizziness, no HAs, no rashes, no melena/hematochezia.  No polyuria or polydipsia.  No myalgias or arthralgias.  No focal weakness, paresthesias, or tremors.  No acute vision or hearing abnormalities.  No dysuria or unusual/new urinary urgency or frequency.  No recent changes in lower legs. No n/v/d or abd pain.  No palpitations.    Past Medical History:  Diagnosis Date   B12 deficiency    Blood transfusion without reported diagnosis    Chronic low back pain    Dr. Vear Clock is pain mgmt MD   Depression    Diabetes mellitus with complication (HCC)    DPN +  microalbuminuria   GERD (gastroesophageal reflux disease)    Heart murmur    History of adenomatous polyp of colon 05/22/2021   recall 7 yrs   Hypercholesterolemia    Statin intolerant.  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   Persistent asthma    Never smoker   Seizures (HCC)    Stable angina    nl myoview 12/07  followed by Dr Eden Emms   Statin intolerance    myalgias   Subclinical hypothyroidism 06/2017    Past Surgical History:  Procedure Laterality Date   COLONOSCOPY  05/22/2021   Adenomas--recall 7 years   CYSTOSCOPY WITH RETROGRADE URETHROGRAM N/A 08/21/2021   Procedure: RETROGRADE URETHROGRAM;  Surgeon: Crist Fat, MD;  Location: [facility];  Service: Urology;  Laterality: N/A;   CYSTOSCOPY WITH URETHRAL DILATATION N/A 08/21/2021   Procedure: CYSTOSCOPY WITH URETHRAL DILATATION OPTILUME;  Surgeon: Crist Fat, MD;  Location: [facility];  Service: Urology;  Laterality: N/A;   LUMBAR SPINE SURGERY     lumbar laminectomy with hardware stabilization, with ensuing arachnoiditis   URETHRAL STRICTURE DILATATION  1996 and 2004.   Laser surgery.    Outpatient Medications Prior to  Visit  Medication Sig Dispense Refill   Acetaminophen (TYLENOL PO) Take 500 mg by mouth daily as needed (pain).     albuterol (VENTOLIN HFA) 108 (90 Base) MCG/ACT inhaler INHALE 1-2 PUFFS BY MOUTH EVERY 6 HOURS AS NEEDED FOR WHEEZE OR SHORTNESS OF BREATH 18 each 0   B-D ULTRAFINE III SHORT PEN 31G X 8 MM MISC USE AS DIRECTED TWICE DAILY FOR MEDICATION INJECTION 100 each 1   blood glucose meter kit and supplies KIT Dispense based on patient and insurance preference. Use to check glucose 4 times daily. 1 each 0   Evolocumab (REPATHA SURECLICK) 140 MG/ML SOAJ INJECT 1 DOSE INTO THE SKIN EVERY 14 (FOURTEEN) DAYS. 6 mL 3    FREESTYLE TEST STRIPS test strip USE TO CHECK GLUCOSE 4 TIMES DAILY. 200 strip 4   furosemide (LASIX) 20 MG tablet TAKE 1 TABLET BY MOUTH EVERY MORNING AS NEEDED FOR SWELLING 30 tablet 0   hydrochlorothiazide (HYDRODIURIL) 25 MG tablet Take 1 tablet (25 mg total) by mouth daily. 90 tablet 1   insulin degludec (TRESIBA FLEXTOUCH) 100 UNIT/ML FlexTouch Pen 72 U qAM and 68 U qPM 66 mL 1   lisinopril (ZESTRIL) 40 MG tablet Take 1 tablet (40 mg total) by mouth daily. 90 tablet 1   methocarbamol (ROBAXIN) 500 MG tablet Take 500 mg by mouth 2 (two) times daily as needed for muscle spasms.     metoprolol tartrate (LOPRESSOR) 50 MG tablet TAKE 1 TABLET BY MOUTH TWICE A DAY. OFFICE VISIT NEEDED FOR FURTHER REFILLS 180 tablet 1   morphine (MS CONTIN) 30 MG 12 hr tablet Take 30-60 mg by mouth See admin instructions. Taking 60 mg in the AM and 30 mg in the afternoon and 60 mg at bedtime     Multiple Vitamin (MULTIVITAMIN) tablet Take 1 tablet by mouth daily.     nitroGLYCERIN (NITROSTAT) 0.4 MG SL tablet PLACE 1 TABLET UNDER THE TONGUE EVERY 5 MINUTES AS NEEDED. 25 tablet 1   venlafaxine XR (EFFEXOR-XR) 150 MG 24 hr capsule Take 150 mg by mouth 2 (two) times daily.  3   esomeprazole (NEXIUM) 40 MG capsule Take 1 capsule (40 mg total) by mouth daily. 90 capsule 1   No facility-administered medications prior to visit.    Allergies  Allergen Reactions   Ace Inhibitors     Kidney failure   Atorvastatin Other (See Comments)    Arm pain   Prednisone     REACTION: Anxiety   Septra [Sulfamethoxazole-Trimethoprim]    Metformin And Related Diarrhea    Review of Systems As per HPI  PE:    11/05/2022    1:58 PM 11/05/2022    1:43 PM 08/05/2022    2:23 PM  Vitals with BMI  Weight  333 lbs   Systolic 156 179 865  Diastolic 87 91 80  Pulse  77      Physical Exam  Gen: Alert, well appearing.  Patient is oriented to person, place, time, and situation. AFFECT: pleasant, lucid thought and speech. No  further exam today  LABS:  Last CBC Lab Results  Component Value Date   WBC 16.0 (H) 08/05/2022   HGB 14.5 08/05/2022   HCT 45.0 08/05/2022   MCV 84.9 08/05/2022   MCH 27.0 08/19/2021   RDW 13.7 08/05/2022   PLT 355.0 08/05/2022   Last metabolic panel Lab Results  Component Value Date   GLUCOSE 158 (H) 08/05/2022   NA 138 08/05/2022   K 4.7 08/05/2022  CL 100 08/05/2022   CO2 29 08/05/2022   BUN 12 08/05/2022   CREATININE 0.83 08/05/2022   GFR 98.00 08/05/2022   CALCIUM 9.1 08/05/2022   PHOS 2.5 08/10/2012   PROT 7.4 08/05/2022   ALBUMIN 3.6 08/05/2022   BILITOT 0.6 08/05/2022   ALKPHOS 84 08/05/2022   AST 15 08/05/2022   ALT 16 08/05/2022   ANIONGAP 6 08/19/2021   Last lipids Lab Results  Component Value Date   CHOL 155 08/05/2022   HDL 44.20 08/05/2022   LDLCALC 96 08/05/2022   LDLDIRECT 166.2 11/09/2013   TRIG 71.0 08/05/2022   CHOLHDL 3 08/05/2022   Last hemoglobin A1c Lab Results  Component Value Date   HGBA1C 9.1 (A) 11/05/2022   HGBA1C 9.1 11/05/2022   HGBA1C 9.1 (A) 11/05/2022   HGBA1C 9.1 (A) 11/05/2022   Last thyroid functions Lab Results  Component Value Date   TSH 1.84 09/12/2020   T3TOTAL 127 10/18/2019   IMPRESSION AND PLAN:  #1 diabetes with peripheral neuropathy. Poor control. POC Hba1c today is 9.1%. Start novolog 5 U w/supper.  Cont tresiba 80 U qAM and 72 U qPM. Set up Libra III continuous glucose monitoring.  #2 hypertension, significant whitecoat component. Cont current bp regimen. HCTZ 25 mg a day, lisinopril 40 mg a day, and Lopressor 50 mg twice a day. Basic metabolic panel today.  3.  Chronic leukocytosis. Platelets and hemoglobin have consistently been normal. WBC count anywhere from 11 K to 16 K over the last 10 years or so. CBC and technologist smear review ordered today.  An After Visit Summary was printed and given to the patient.  FOLLOW UP: Return in about 3 weeks (around 11/26/2022) for f/u dm. Next cpe  08/2023 Signed:  Santiago Bumpers, MD           11/05/2022

## 2022-11-05 NOTE — Addendum Note (Signed)
Addended by: Emi Holes D on: 11/05/2022 02:25 PM   Modules accepted: Orders

## 2022-11-09 ENCOUNTER — Telehealth: Payer: Self-pay

## 2022-11-09 DIAGNOSIS — Z794 Long term (current) use of insulin: Secondary | ICD-10-CM

## 2022-11-09 MED ORDER — ALBUTEROL SULFATE HFA 108 (90 BASE) MCG/ACT IN AERS: 1.0000 | INHALATION_SPRAY | Freq: Four times a day (QID) | RESPIRATORY_TRACT | 0 refills | Status: DC | PRN

## 2022-11-09 MED ORDER — FREESTYLE LIBRE 3 SENSOR MISC
3 refills | Status: DC
Start: 2022-11-09 — End: 2023-06-01

## 2022-11-09 MED ORDER — FREESTYLE LIBRE 3 READER DEVI: 3 refills | Status: AC

## 2022-11-09 NOTE — Telephone Encounter (Signed)
Pt called to inquire about Freestyle Libre 3 and albuterol prescription. Pt advised of process for Freestyle thru third party company Education [employer], [employer]). Unfortunately there was a issue with finding a company to pair with his insurance. Both prescriptions have been sent in.

## 2022-11-25 NOTE — Patient Instructions (Addendum)
Continue taking Tresiba 80 Units in the morning and 72 Units in the evening (doesn't matter whether it is before eating or after).  Take 3 units of novolog right before breakfast, lunch, and supper. Try to eat a bedtime snack such as 2 or 3 peanut butter crackers ("nabs").

## 2022-11-26 ENCOUNTER — Ambulatory Visit (INDEPENDENT_AMBULATORY_CARE_PROVIDER_SITE_OTHER): Payer: PPO | Admitting: Family Medicine

## 2022-11-26 ENCOUNTER — Encounter: Payer: Self-pay | Admitting: Family Medicine

## 2022-11-26 VITALS — BP 160/88 | HR 106 | Temp 97.5°F | Wt 341.6 lb

## 2022-11-26 DIAGNOSIS — Z794 Long term (current) use of insulin: Secondary | ICD-10-CM | POA: Diagnosis not present

## 2022-11-26 DIAGNOSIS — I10 Essential (primary) hypertension: Secondary | ICD-10-CM | POA: Diagnosis not present

## 2022-11-26 DIAGNOSIS — E1140 Type 2 diabetes mellitus with diabetic neuropathy, unspecified: Secondary | ICD-10-CM | POA: Diagnosis not present

## 2022-11-26 MED ORDER — NOVOLOG FLEXPEN 100 UNIT/ML ~~LOC~~ SOPN: PEN_INJECTOR | SUBCUTANEOUS | Status: DC

## 2022-11-26 NOTE — Progress Notes (Unsigned)
OFFICE VISIT  11/28/2022  CC:  Chief Complaint  Patient presents with   Follow-up    Patient is a 57 y.o. male who presents accompanied by his wife [name] for 3-week follow-up diabetes. A/P as of last visit: "1 diabetes with peripheral neuropathy. Poor control. POC Hba1c today is 9.1%. Start novolog 5 U w/supper.  Cont tresiba 80 U qAM and 72 U qPM. Set up [employer] III continuous glucose monitoring.   #2 hypertension, significant whitecoat component. Cont current bp regimen. HCTZ 25 mg a day, lisinopril 40 mg a day, and Lopressor 50 mg twice a day. Basic metabolic panel today.   3.  Chronic leukocytosis. Platelets and hemoglobin have consistently been normal. WBC count anywhere from 11 K to 16 K over the last 10 years or so. CBC and technologist smear review ordered today."  INTERIM HX: He is feeling well. Glucoses still very erratic, varying down into the 50s to up over 300s. No clear pattern, although hypoglycemia seems to be mostly early in the morning.  He does tend to eat a lot of carbs and reaction to low sugar, possibly responsible for shooting his sugar up later.   Past Medical History:  Diagnosis Date   B12 deficiency    Blood transfusion without reported diagnosis    Chronic low back pain    Dr. Vear Clock is pain mgmt MD   Depression    Diabetes mellitus with complication (HCC)    DPN + microalbuminuria   GERD (gastroesophageal reflux disease)    Heart murmur    History of adenomatous polyp of colon 05/22/2021   recall 7 yrs   Hypercholesterolemia    Statin intolerant.  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   Persistent asthma    Never smoker   Seizures (HCC)    Stable angina    nl myoview 12/07  followed by Dr Eden Emms   Statin intolerance    myalgias   Subclinical hypothyroidism  06/2017    Past Surgical History:  Procedure Laterality Date   COLONOSCOPY  05/22/2021   Adenomas--recall 7 years   CYSTOSCOPY WITH RETROGRADE URETHROGRAM N/A 08/21/2021   Procedure: RETROGRADE URETHROGRAM;  Surgeon: Crist Fat, MD;  Location: [facility];  Service: Urology;  Laterality: N/A;   CYSTOSCOPY WITH URETHRAL DILATATION N/A 08/21/2021   Procedure: CYSTOSCOPY WITH URETHRAL DILATATION OPTILUME;  Surgeon: Crist Fat, MD;  Location: [facility];  Service: Urology;  Laterality: N/A;   LUMBAR SPINE SURGERY     lumbar laminectomy with hardware stabilization, with ensuing arachnoiditis   URETHRAL STRICTURE DILATATION  1996 and 2004.   Laser surgery.    Outpatient Medications Prior to Visit  Medication Sig Dispense Refill   Acetaminophen (TYLENOL PO) Take 500 mg by mouth daily as needed (pain).     albuterol (VENTOLIN HFA) 108 (90 Base) MCG/ACT inhaler Inhale 1-2 puffs into the lungs every 6 (six) hours as needed for wheezing or shortness of breath. 18 each 0   B-D ULTRAFINE III SHORT PEN 31G X 8 MM MISC USE AS DIRECTED TWICE DAILY FOR MEDICATION INJECTION 100 each 1   blood glucose meter kit and supplies KIT Dispense based on patient and insurance preference. Use to check glucose 4 times daily. 1 each 0   Continuous Glucose Receiver (FREESTYLE LIBRE 3 READER) DEVI  Use to check glucose continuously 2 each 3   Continuous Glucose Sensor (FREESTYLE LIBRE 3 SENSOR) MISC Place 1 sensor on the skin every 14 days. Use to check glucose continuously 2 each 3   esomeprazole (NEXIUM) 40 MG capsule Take 1 capsule (40 mg total) by mouth daily. 90 capsule 1   Evolocumab (REPATHA SURECLICK) 140 MG/ML SOAJ INJECT 1 DOSE INTO THE SKIN EVERY 14 (FOURTEEN) DAYS. 6 mL 3   FREESTYLE TEST STRIPS test strip USE TO CHECK GLUCOSE 4 TIMES DAILY. 200 strip 4   furosemide (LASIX) 20 MG tablet TAKE 1 TABLET BY MOUTH EVERY MORNING AS NEEDED FOR SWELLING 30 tablet 0   hydrochlorothiazide (HYDRODIURIL) 25 MG  tablet Take 1 tablet (25 mg total) by mouth daily. 90 tablet 1   insulin degludec (TRESIBA FLEXTOUCH) 100 UNIT/ML FlexTouch Pen 72 U qAM and 68 U qPM 66 mL 1   lisinopril (ZESTRIL) 40 MG tablet Take 1 tablet (40 mg total) by mouth daily. 90 tablet 1   methocarbamol (ROBAXIN) 500 MG tablet Take 500 mg by mouth 2 (two) times daily as needed for muscle spasms.     metoprolol tartrate (LOPRESSOR) 50 MG tablet TAKE 1 TABLET BY MOUTH TWICE A DAY. OFFICE VISIT NEEDED FOR FURTHER REFILLS 180 tablet 1   morphine (MS CONTIN) 30 MG 12 hr tablet Take 30-60 mg by mouth See admin instructions. Taking 60 mg in the AM and 30 mg in the afternoon and 60 mg at bedtime     Multiple Vitamin (MULTIVITAMIN) tablet Take 1 tablet by mouth daily.     venlafaxine XR (EFFEXOR-XR) 150 MG 24 hr capsule Take 150 mg by mouth 2 (two) times daily.  3   insulin aspart (NOVOLOG FLEXPEN) 100 UNIT/ML FlexPen 5 U SQ  at supper 15 mL 1   nitroGLYCERIN (NITROSTAT) 0.4 MG SL tablet PLACE 1 TABLET UNDER THE TONGUE EVERY 5 MINUTES AS NEEDED. (Patient not taking: Reported on 11/26/2022) 25 tablet 1   No facility-administered medications prior to visit.    Allergies  Allergen Reactions   Ace Inhibitors     Kidney failure   Atorvastatin Other (See Comments)    Arm pain   Prednisone     REACTION: Anxiety   Septra [Sulfamethoxazole-Trimethoprim]    Metformin And Related Diarrhea    Review of Systems As per HPI  PE:    11/26/2022    3:35 PM 11/26/2022    3:27 PM 11/05/2022    1:58 PM  Vitals with BMI  Weight  341 lbs 10 oz   Systolic 160 175 621  Diastolic 88 93 87  Pulse  106      Physical Exam  Gen: Alert, well appearing.  Patient is oriented to person, place, time, and situation. AFFECT: pleasant, lucid thought and speech. No further exam today.  LABS:  Last metabolic panel Lab Results  Component Value Date   GLUCOSE 177 (H) 11/05/2022   NA 139 11/05/2022   K 5.2 11/05/2022   CL 102 11/05/2022   CO2 26  11/05/2022   BUN 13 11/05/2022   CREATININE 1.07 11/05/2022   GFR 98.00 08/05/2022   CALCIUM 8.9 11/05/2022   PHOS 2.5 08/10/2012   PROT 7.4 08/05/2022   ALBUMIN 3.6 08/05/2022   BILITOT 0.6 08/05/2022   ALKPHOS 84 08/05/2022   AST 15 08/05/2022   ALT 16 08/05/2022   ANIONGAP 6 08/19/2021   Lab Results  Component Value Date   WBC 13.2 (H) 11/05/2022   HGB  14.9 11/05/2022   HCT 45.8 11/05/2022   MCV 84.0 11/05/2022   PLT 370 11/05/2022   Last hemoglobin A1c Lab Results  Component Value Date   HGBA1C 9.1 (A) 11/05/2022   HGBA1C 9.1 11/05/2022   HGBA1C 9.1 (A) 11/05/2022   HGBA1C 9.1 (A) 11/05/2022   IMPRESSION AND PLAN:  1) diabetes with peripheral neuropathy. Poor control. A1c 3 weeks ago was 9.1% Change NovoLog to 3 units with each meal.  Cont tresiba 80 U qAM and 72 U qPM. He is working on setting up [employer] III continuous glucose monitoring.   #2 hypertension, significant whitecoat component. He is pretty adamant about not changing antihypertensive regimen. Cont current bp regimen. HCTZ 25 mg a day, lisinopril 40 mg a day, and Lopressor 50 mg twice a day. Renal function and electrolytes were normal 3 weeks ago.  3.  Chronic leukocytosis. Platelets and hemoglobin have consistently been normal. WBC count anywhere from 11 K to 16 K over the last 10 years or so. Peripheral smear reactive? Will discuss referral to hematologist in near future.  An After Visit Summary was printed and given to the patient.  FOLLOW UP: Return in about 2 weeks (around 12/10/2022) for f/u DM.  Next cpe 08/2023 Signed:  Santiago Bumpers, MD           11/28/2022

## 2022-11-28 ENCOUNTER — Other Ambulatory Visit: Payer: Self-pay | Admitting: Family Medicine

## 2022-12-03 ENCOUNTER — Telehealth: Payer: Self-pay

## 2022-12-03 NOTE — Telephone Encounter (Signed)
Signed and put in box to go up front.  

## 2022-12-03 NOTE — Telephone Encounter (Signed)
Fax received from [facility] for PCP to authorize order of glucose testing supplies.  Placed on PCP desk to review and sign, if appropriate.

## 2022-12-09 ENCOUNTER — Ambulatory Visit (INDEPENDENT_AMBULATORY_CARE_PROVIDER_SITE_OTHER): Payer: PPO | Admitting: Family Medicine

## 2022-12-09 ENCOUNTER — Encounter: Payer: Self-pay | Admitting: Family Medicine

## 2022-12-09 VITALS — BP 170/80 | HR 116 | Temp 97.2°F | Wt 338.0 lb

## 2022-12-09 DIAGNOSIS — I10 Essential (primary) hypertension: Secondary | ICD-10-CM

## 2022-12-09 DIAGNOSIS — E1142 Type 2 diabetes mellitus with diabetic polyneuropathy: Secondary | ICD-10-CM | POA: Diagnosis not present

## 2022-12-09 DIAGNOSIS — R051 Acute cough: Secondary | ICD-10-CM | POA: Diagnosis not present

## 2022-12-09 DIAGNOSIS — J209 Acute bronchitis, unspecified: Secondary | ICD-10-CM

## 2022-12-09 DIAGNOSIS — Z794 Long term (current) use of insulin: Secondary | ICD-10-CM

## 2022-12-09 DIAGNOSIS — R0989 Other specified symptoms and signs involving the circulatory and respiratory systems: Secondary | ICD-10-CM

## 2022-12-09 LAB — POCT INFLUENZA A/B
Influenza A, POC: NEGATIVE
Influenza B, POC: NEGATIVE

## 2022-12-09 LAB — POC COVID19 BINAXNOW: SARS Coronavirus 2 Ag: NEGATIVE

## 2022-12-09 MED ORDER — DOXYCYCLINE HYCLATE 100 MG PO CAPS: 100.0000 mg | ORAL_CAPSULE | Freq: Two times a day (BID) | ORAL | 0 refills | Status: AC

## 2022-12-09 MED ORDER — TRESIBA FLEXTOUCH 100 UNIT/ML ~~LOC~~ SOPN: PEN_INJECTOR | SUBCUTANEOUS | Status: DC

## 2022-12-09 NOTE — Progress Notes (Signed)
OFFICE VISIT  12/09/2022  CC:  Chief Complaint  Patient presents with   Diabetes    F/u, started 2-3 days ago phlegm clear, sometimes yellow chest sore from coughing no body aches /chills. Daughter has been sick    Patient is a 57 y.o. male who presents for 2-week follow-up diabetes. A/P as of last visit: "1) diabetes with peripheral neuropathy. Poor control. A1c 3 weeks ago was 9.1% Change NovoLog to 3 units with each meal.  Cont tresiba 80 U qAM and 72 U qPM. He is working on setting up [employer] III continuous glucose monitoring.   #2 hypertension, significant whitecoat component. He is pretty adamant about not changing antihypertensive regimen. Cont current bp regimen. HCTZ 25 mg a day, lisinopril 40 mg a day, and Lopressor 50 mg twice a day. Renal function and electrolytes were normal 3 weeks ago.   3.  Chronic leukocytosis. Platelets and hemoglobin have consistently been normal. WBC count anywhere from 11 K to 16 K over the last 10 years or so. Peripheral smear reactive? Will discuss referral to hematologist in near future."  INTERIM HX: Glucoses are a little bit better.  He is trying to get the timing of his mealtime insulin correct.  Occasionally has had low glucose because he gets sidetracked for eating. Uses anywhere from 3 to 10 units of NovoLog at meals.   He is taking 80 units of Tresiba in the morning and 72 units in the evening.  He has had about 3 days of nasal congestion/sinus congestion, rattly and productive cough.  No shortness of breath.  He has a little bit of wheezing.  He uses albuterol and it does get some short-term relief. No fever, chills, body aches, or malaise. He has been taking an over-the-counter cold medicine but does not know which 1.  ROS as above, plus--> no CP, no SOB, no wheezing, no cough, no dizziness, no HAs, no rashes, no melena/hematochezia.  No polyuria or polydipsia.  No focal weakness, paresthesias, or tremors.  No acute vision or  hearing abnormalities.  No dysuria or unusual/new urinary urgency or frequency.  No recent changes in lower legs. No n/v/d or abd pain.  No palpitations.     Past Medical History:  Diagnosis Date   B12 deficiency    Blood transfusion without reported diagnosis    Chronic low back pain    Dr. Vear Clock is pain mgmt MD   Depression    Diabetes mellitus with complication (HCC)    DPN + microalbuminuria   GERD (gastroesophageal reflux disease)    Heart murmur    History of adenomatous polyp of colon 05/22/2021   recall 7 yrs   Hypercholesterolemia    Statin intolerant.  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   Persistent asthma    Never smoker   Seizures (HCC)    Stable angina    nl myoview 12/07  followed by Dr Eden Emms   Statin intolerance    myalgias   Subclinical hypothyroidism 06/2017    Past Surgical History:  Procedure Laterality Date   COLONOSCOPY  05/22/2021   Adenomas--recall 7 years   CYSTOSCOPY WITH RETROGRADE URETHROGRAM N/A 08/21/2021   Procedure: RETROGRADE URETHROGRAM;  Surgeon: Crist Fat, MD;  Location: [facility];  Service: Urology;  Laterality: N/A;   CYSTOSCOPY  WITH URETHRAL DILATATION N/A 08/21/2021   Procedure: CYSTOSCOPY WITH URETHRAL DILATATION OPTILUME;  Surgeon: Crist Fat, MD;  Location: [facility];  Service: Urology;  Laterality: N/A;   LUMBAR SPINE SURGERY     lumbar laminectomy with hardware stabilization, with ensuing arachnoiditis   URETHRAL STRICTURE DILATATION  1996 and 2004.   Laser surgery.    Outpatient Medications Prior to Visit  Medication Sig Dispense Refill   Acetaminophen (TYLENOL PO) Take 500 mg by mouth daily as needed (pain).     albuterol (VENTOLIN HFA) 108 (90 Base) MCG/ACT inhaler Inhale 1-2 puffs into the lungs every 6 (six) hours as needed for  wheezing or shortness of breath. 18 each 0   B-D ULTRAFINE III SHORT PEN 31G X 8 MM MISC USE AS DIRECTED TWICE DAILY FOR MEDICATION INJECTION 100 each 1   blood glucose meter kit and supplies KIT Dispense based on patient and insurance preference. Use to check glucose 4 times daily. 1 each 0   Continuous Glucose Receiver (FREESTYLE LIBRE 3 READER) DEVI Use to check glucose continuously 2 each 3   Continuous Glucose Sensor (FREESTYLE LIBRE 3 SENSOR) MISC Place 1 sensor on the skin every 14 days. Use to check glucose continuously 2 each 3   esomeprazole (NEXIUM) 40 MG capsule Take 1 capsule (40 mg total) by mouth daily. 90 capsule 1   Evolocumab (REPATHA SURECLICK) 140 MG/ML SOAJ INJECT 1 DOSE INTO THE SKIN EVERY 14 (FOURTEEN) DAYS. 6 mL 3   FREESTYLE TEST STRIPS test strip USE TO CHECK GLUCOSE 4 TIMES DAILY. 200 strip 4   furosemide (LASIX) 20 MG tablet TAKE 1 TABLET BY MOUTH EVERY MORNING AS NEEDED FOR SWELLING 30 tablet 0   hydrochlorothiazide (HYDRODIURIL) 25 MG tablet Take 1 tablet (25 mg total) by mouth daily. 90 tablet 1   insulin aspart (NOVOLOG FLEXPEN) 100 UNIT/ML FlexPen 3 units SQ before breakfast, lunch, and dinner     insulin degludec (TRESIBA FLEXTOUCH) 100 UNIT/ML FlexTouch Pen 72 U qAM and 68 U qPM 66 mL 1   lisinopril (ZESTRIL) 40 MG tablet Take 1 tablet (40 mg total) by mouth daily. 90 tablet 1   methocarbamol (ROBAXIN) 500 MG tablet Take 500 mg by mouth 2 (two) times daily as needed for muscle spasms.     metoprolol tartrate (LOPRESSOR) 50 MG tablet TAKE 1 TABLET BY MOUTH TWICE A DAY. OFFICE VISIT NEEDED FOR FURTHER REFILLS 180 tablet 1   morphine (MS CONTIN) 30 MG 12 hr tablet Take 30-60 mg by mouth See admin instructions. Taking 60 mg in the AM and 30 mg in the afternoon and 60 mg at bedtime     Multiple Vitamin (MULTIVITAMIN) tablet Take 1 tablet by mouth daily.     nitroGLYCERIN (NITROSTAT) 0.4 MG SL tablet PLACE 1 TABLET UNDER THE TONGUE EVERY 5 MINUTES AS NEEDED. 25 tablet 1    venlafaxine XR (EFFEXOR-XR) 150 MG 24 hr capsule Take 150 mg by mouth 2 (two) times daily.  3   No facility-administered medications prior to visit.    Allergies  Allergen Reactions   Ace Inhibitors     Kidney failure   Atorvastatin Other (See Comments)    Arm pain   Prednisone     REACTION: Anxiety   Septra [Sulfamethoxazole-Trimethoprim]    Metformin And Related Diarrhea    Review of Systems As per HPI  PE:    12/09/2022    3:18 PM 12/09/2022    3:09 PM 11/26/2022    3:35 PM  Vitals with BMI  Weight  338 lbs   Systolic 170 173 409  Diastolic 80 97 88  Pulse  116      Physical Exam  VS: noted---->bp 170/80, HR 116, 02 sat 96% RA, T 97.2  Wt 338.2 lbs Gen: alert, NAD, NONTOXIC APPEARING. HEENT: eyes without injection, drainage, or swelling.  Ears: EACs clear, TMs with normal light reflex and landmarks.  Nose: Clear rhinorrhea, with some dried, crusty exudate adherent to mildly injected mucosa.  No purulent d/c.  No paranasal sinus TTP.  No facial swelling.  Throat and mouth without focal lesion.  No pharyngial swelling, erythema, or exudate.   Neck: supple, no LAD.   LUNGS: Soft dry inspiratory crackles in both bases, bilateral expiratory wheezing with mild prolonged expiratory phase.  Normal work of breathing. CV: Regular rhythm, tachycardia to 116, no m/r/g. EXT: no c/c/e SKIN: no rash   LABS:  Last CBC Lab Results  Component Value Date   WBC 13.2 (H) 11/05/2022   HGB 14.9 11/05/2022   HCT 45.8 11/05/2022   MCV 84.0 11/05/2022   MCH 27.3 11/05/2022   RDW 12.6 11/05/2022   PLT 370 11/05/2022   Last metabolic panel Lab Results  Component Value Date   GLUCOSE 177 (H) 11/05/2022   NA 139 11/05/2022   K 5.2 11/05/2022   CL 102 11/05/2022   CO2 26 11/05/2022   BUN 13 11/05/2022   CREATININE 1.07 11/05/2022   GFR 98.00 08/05/2022   CALCIUM 8.9 11/05/2022   PHOS 2.5 08/10/2012   PROT 7.4 08/05/2022   ALBUMIN 3.6 08/05/2022   BILITOT 0.6 08/05/2022    ALKPHOS 84 08/05/2022   AST 15 08/05/2022   ALT 16 08/05/2022   ANIONGAP 6 08/19/2021   Last lipids Lab Results  Component Value Date   CHOL 155 08/05/2022   HDL 44.20 08/05/2022   LDLCALC 96 08/05/2022   LDLDIRECT 166.2 11/09/2013   TRIG 71.0 08/05/2022   CHOLHDL 3 08/05/2022   Last hemoglobin A1c Lab Results  Component Value Date   HGBA1C 9.1 (A) 11/05/2022   HGBA1C 9.1 11/05/2022   HGBA1C 9.1 (A) 11/05/2022   HGBA1C 9.1 (A) 11/05/2022   Last thyroid functions Lab Results  Component Value Date   TSH 1.84 09/12/2020   T3TOTAL 127 10/18/2019   Last vitamin D Lab Results  Component Value Date   VD25OH 77 06/22/2011   Last vitamin B12 and Folate Lab Results  Component Value Date   VITAMINB12 444 10/18/2019   FOLATE >20.0 ng/mL 11/05/2009   IMPRESSION AND PLAN:  #1 diabetes with peripheral neuropathy. Poor control. A1c 4 weeks ago was 9.1% Continue to adjust NovoLog before every meal, reminded him of the importance of avoiding hypoglycemia. Cont tresiba 80 U qAM and 72 U qPM Next A1c after 02/05/2023.    #2 hypertension, significant whitecoat component. He is pretty adamant about not changing antihypertensive regimen. Cont current bp regimen. HCTZ 25 mg a day, lisinopril 40 mg a day, and Lopressor 50 mg twice a day. Renal function and electrolytes were normal 5 weeks ago. I cautioned him about use of over-the-counter cold medicines with decongestants.  #3 URI with bronchitis. We discussed the use of prednisone but held off because he has a history of thrush and increased blood pressure on prednisone. Doxycycline 100 mg twice daily x 7 days prescribed.  Continue albuterol 2 puffs every 4 hours as needed. He does not smoke.  An After Visit Summary was printed and given to the  patient.  FOLLOW UP: Return in about 2 months (around 02/08/2023) for routine chronic illness f/u.  Signed:  Santiago Bumpers, MD           12/09/2022

## 2022-12-10 NOTE — Addendum Note (Signed)
Addended by: Emi Holes D on: 12/10/2022 11:23 AM   Modules accepted: Orders

## 2022-12-15 ENCOUNTER — Other Ambulatory Visit: Payer: Self-pay | Admitting: Family Medicine

## 2022-12-21 ENCOUNTER — Telehealth: Payer: Self-pay | Admitting: Family Medicine

## 2022-12-21 NOTE — Telephone Encounter (Signed)
Patient would like to confirm he is scheduled for the eye exam that will be performed here. Please give the patient a call to discuss appointment.

## 2022-12-21 NOTE — Telephone Encounter (Signed)
Pt is scheduled for 3:30p today. LVM for pt to return call.  Note: if pt returns call, please inform of time.

## 2022-12-30 DIAGNOSIS — M961 Postlaminectomy syndrome, not elsewhere classified: Secondary | ICD-10-CM | POA: Diagnosis not present

## 2022-12-30 DIAGNOSIS — E1142 Type 2 diabetes mellitus with diabetic polyneuropathy: Secondary | ICD-10-CM | POA: Diagnosis not present

## 2022-12-30 DIAGNOSIS — G894 Chronic pain syndrome: Secondary | ICD-10-CM | POA: Diagnosis not present

## 2022-12-30 DIAGNOSIS — Z79891 Long term (current) use of opiate analgesic: Secondary | ICD-10-CM | POA: Diagnosis not present

## 2023-01-04 ENCOUNTER — Telehealth: Payer: Self-pay

## 2023-01-04 NOTE — Telephone Encounter (Signed)
A user error has taken place: encounter opened in error, closed for administrative reasons.

## 2023-01-12 NOTE — Progress Notes (Deleted)
LM for pt to return call back

## 2023-01-16 NOTE — Progress Notes (Signed)
This patient is appearing on a report for being at risk of failing the adherence measure for hypertension (ACEi/ARB) medications this calendar year.   Medication: lisinopril 40 mg PO daily Last fill date: 11/01/22 for 90 day supply  Insurance report was not up to date. No action needed at this time.  Confirmed that new refills were sent 11/05/22.   Nils Pyle, PharmD PGY1 Pharmacy Resident

## 2023-02-09 ENCOUNTER — Ambulatory Visit: Payer: PPO | Admitting: Family Medicine

## 2023-02-09 NOTE — Progress Notes (Deleted)
OFFICE VISIT  02/09/2023  CC: No chief complaint on file.   Patient is a 57 y.o. male who presents for 73-month follow-up diabetes and hypertension. A/P as of last visit: "#1 diabetes with peripheral neuropathy. Poor control. A1c 4 weeks ago was 9.1% Continue to adjust NovoLog before every meal, reminded him of the importance of avoiding hypoglycemia. Cont tresiba 80 U qAM and 72 U qPM Next A1c after 02/05/2023.     #2 hypertension, significant whitecoat component. He is pretty adamant about not changing antihypertensive regimen. Cont current bp regimen. HCTZ 25 mg a day, lisinopril 40 mg a day, and Lopressor 50 mg twice a day. Renal function and electrolytes were normal 5 weeks ago. I cautioned him about use of over-the-counter cold medicines with decongestants." INTERIM HX: ***  Past Medical History:  Diagnosis Date   B12 deficiency    Blood transfusion without reported diagnosis    Chronic low back pain    Dr. Vear Clock is pain mgmt MD   Depression    Diabetes mellitus with complication (HCC)    DPN + microalbuminuria   GERD (gastroesophageal reflux disease)    Heart murmur    History of adenomatous polyp of colon 05/22/2021   recall 7 yrs   Hypercholesterolemia    Statin intolerant.  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   Persistent asthma    Never smoker   Seizures (HCC)    Stable angina    nl myoview 12/07  followed by Dr Eden Emms   Statin intolerance    myalgias   Subclinical hypothyroidism 06/2017    Past Surgical History:  Procedure Laterality Date   COLONOSCOPY  05/22/2021   Adenomas--recall 7 years   CYSTOSCOPY WITH RETROGRADE URETHROGRAM N/A 08/21/2021   Procedure: RETROGRADE URETHROGRAM;  Surgeon: Crist Fat, MD;  Location: [facility];  Service: Urology;  Laterality: N/A;    CYSTOSCOPY WITH URETHRAL DILATATION N/A 08/21/2021   Procedure: CYSTOSCOPY WITH URETHRAL DILATATION OPTILUME;  Surgeon: Crist Fat, MD;  Location: [facility];  Service: Urology;  Laterality: N/A;   LUMBAR SPINE SURGERY     lumbar laminectomy with hardware stabilization, with ensuing arachnoiditis   URETHRAL STRICTURE DILATATION  1996 and 2004.   Laser surgery.    Outpatient Medications Prior to Visit  Medication Sig Dispense Refill   Acetaminophen (TYLENOL PO) Take 500 mg by mouth daily as needed (pain).     albuterol (VENTOLIN HFA) 108 (90 Base) MCG/ACT inhaler Inhale 1-2 puffs into the lungs every 6 (six) hours as needed for wheezing or shortness of breath. 18 each 0   B-D ULTRAFINE III SHORT PEN 31G X 8 MM MISC USE AS DIRECTED TWICE DAILY FOR MEDICATION INJECTION 100 each 1   blood glucose meter kit and supplies KIT Dispense based on patient and insurance preference. Use to check glucose 4 times daily. 1 each 0   Continuous Glucose Receiver (FREESTYLE LIBRE 3 READER) DEVI Use to check glucose continuously 2 each 3   Continuous Glucose Sensor (FREESTYLE LIBRE 3 SENSOR) MISC Place 1 sensor on the skin every 14 days. Use to check glucose continuously 2 each 3   esomeprazole (NEXIUM) 40 MG capsule Take 1 capsule (40 mg total) by mouth daily. 90 capsule 1   Evolocumab (REPATHA SURECLICK) 140 MG/ML SOAJ INJECT 1 DOSE  INTO THE SKIN EVERY 14 (FOURTEEN) DAYS. 6 mL 3   FREESTYLE TEST STRIPS test strip USE TO CHECK GLUCOSE 4 TIMES DAILY. 200 strip 4   furosemide (LASIX) 20 MG tablet TAKE 1 TABLET BY MOUTH EVERY MORNING AS NEEDED FOR SWELLING 30 tablet 0   hydrochlorothiazide (HYDRODIURIL) 25 MG tablet Take 1 tablet (25 mg total) by mouth daily. 90 tablet 1   insulin aspart (NOVOLOG FLEXPEN) 100 UNIT/ML FlexPen 3 units SQ before breakfast, lunch, and dinner     insulin degludec (TRESIBA FLEXTOUCH) 100 UNIT/ML FlexTouch Pen 80 U qAM and 72 U qPM     lisinopril (ZESTRIL) 40 MG tablet Take 1 tablet  (40 mg total) by mouth daily. 90 tablet 1   methocarbamol (ROBAXIN) 500 MG tablet Take 500 mg by mouth 2 (two) times daily as needed for muscle spasms.     metoprolol tartrate (LOPRESSOR) 50 MG tablet TAKE 1 TABLET BY MOUTH TWICE A DAY. OFFICE VISIT NEEDED FOR FURTHER REFILLS 180 tablet 1   morphine (MS CONTIN) 30 MG 12 hr tablet Take 30-60 mg by mouth See admin instructions. Taking 60 mg in the AM and 30 mg in the afternoon and 60 mg at bedtime     Multiple Vitamin (MULTIVITAMIN) tablet Take 1 tablet by mouth daily.     nitroGLYCERIN (NITROSTAT) 0.4 MG SL tablet PLACE 1 TABLET UNDER THE TONGUE EVERY 5 MINUTES AS NEEDED. 25 tablet 1   venlafaxine XR (EFFEXOR-XR) 150 MG 24 hr capsule Take 150 mg by mouth 2 (two) times daily.  3   No facility-administered medications prior to visit.    Allergies  Allergen Reactions   Ace Inhibitors     Kidney failure   Atorvastatin Other (See Comments)    Arm pain   Prednisone     REACTION: Anxiety   Septra [Sulfamethoxazole-Trimethoprim]    Metformin And Related Diarrhea    Review of Systems As per HPI  PE:    12/09/2022    3:18 PM 12/09/2022    3:09 PM 11/26/2022    3:35 PM  Vitals with BMI  Weight  338 lbs   Systolic 170 173 962  Diastolic 80 97 88  Pulse  116      Physical Exam  ***  LABS:  Last CBC Lab Results  Component Value Date   WBC 13.2 (H) 11/05/2022   HGB 14.9 11/05/2022   HCT 45.8 11/05/2022   MCV 84.0 11/05/2022   MCH 27.3 11/05/2022   RDW 12.6 11/05/2022   PLT 370 11/05/2022   Last metabolic panel Lab Results  Component Value Date   GLUCOSE 177 (H) 11/05/2022   NA 139 11/05/2022   K 5.2 11/05/2022   CL 102 11/05/2022   CO2 26 11/05/2022   BUN 13 11/05/2022   CREATININE 1.07 11/05/2022   GFR 98.00 08/05/2022   CALCIUM 8.9 11/05/2022   PHOS 2.5 08/10/2012   PROT 7.4 08/05/2022   ALBUMIN 3.6 08/05/2022   BILITOT 0.6 08/05/2022   ALKPHOS 84 08/05/2022   AST 15 08/05/2022   ALT 16 08/05/2022   ANIONGAP 6  08/19/2021   Last lipids Lab Results  Component Value Date   CHOL 155 08/05/2022   HDL 44.20 08/05/2022   LDLCALC 96 08/05/2022   LDLDIRECT 166.2 11/09/2013   TRIG 71.0 08/05/2022   CHOLHDL 3 08/05/2022   Last hemoglobin A1c Lab Results  Component Value Date   HGBA1C 9.1 (A) 11/05/2022   HGBA1C 9.1 11/05/2022   HGBA1C 9.1 (A)  11/05/2022   HGBA1C 9.1 (A) 11/05/2022   IMPRESSION AND PLAN:  No problem-specific Assessment & Plan notes found for this encounter.  POC Hba1c today is ***  An After Visit Summary was printed and given to the patient.  FOLLOW UP: No follow-ups on file. Next CPE 08/2023 Signed:  Santiago Bumpers, MD           02/09/2023

## 2023-03-08 ENCOUNTER — Encounter: Payer: Self-pay | Admitting: Family Medicine

## 2023-03-14 DIAGNOSIS — G894 Chronic pain syndrome: Secondary | ICD-10-CM | POA: Diagnosis not present

## 2023-03-14 DIAGNOSIS — E1142 Type 2 diabetes mellitus with diabetic polyneuropathy: Secondary | ICD-10-CM | POA: Diagnosis not present

## 2023-03-14 DIAGNOSIS — Z79891 Long term (current) use of opiate analgesic: Secondary | ICD-10-CM | POA: Diagnosis not present

## 2023-03-14 DIAGNOSIS — M961 Postlaminectomy syndrome, not elsewhere classified: Secondary | ICD-10-CM | POA: Diagnosis not present

## 2023-05-04 ENCOUNTER — Ambulatory Visit: Payer: PPO | Admitting: Urgent Care

## 2023-05-04 ENCOUNTER — Emergency Department (HOSPITAL_COMMUNITY): Payer: PPO

## 2023-05-04 ENCOUNTER — Other Ambulatory Visit: Payer: Self-pay

## 2023-05-04 ENCOUNTER — Inpatient Hospital Stay (HOSPITAL_COMMUNITY)
Admission: EM | Admit: 2023-05-04 | Discharge: 2023-05-29 | DRG: 286 | Disposition: A | Discharge status: Home Health | Payer: PPO | Attending: Internal Medicine | Admitting: Internal Medicine

## 2023-05-04 ENCOUNTER — Encounter: Payer: Self-pay | Admitting: Urgent Care

## 2023-05-04 ENCOUNTER — Encounter (HOSPITAL_COMMUNITY): Payer: Self-pay

## 2023-05-04 VITALS — BP 182/135 | HR 119 | Temp 98.0°F | Wt 328.0 lb

## 2023-05-04 DIAGNOSIS — E11649 Type 2 diabetes mellitus with hypoglycemia without coma: Secondary | ICD-10-CM | POA: Diagnosis not present

## 2023-05-04 DIAGNOSIS — E1165 Type 2 diabetes mellitus with hyperglycemia: Secondary | ICD-10-CM | POA: Diagnosis not present

## 2023-05-04 DIAGNOSIS — R051 Acute cough: Secondary | ICD-10-CM

## 2023-05-04 DIAGNOSIS — Z818 Family history of other mental and behavioral disorders: Secondary | ICD-10-CM

## 2023-05-04 DIAGNOSIS — E785 Hyperlipidemia, unspecified: Secondary | ICD-10-CM | POA: Diagnosis present

## 2023-05-04 DIAGNOSIS — G9340 Encephalopathy, unspecified: Secondary | ICD-10-CM | POA: Diagnosis not present

## 2023-05-04 DIAGNOSIS — Z452 Encounter for adjustment and management of vascular access device: Secondary | ICD-10-CM | POA: Diagnosis not present

## 2023-05-04 DIAGNOSIS — Z720 Tobacco use: Secondary | ICD-10-CM

## 2023-05-04 DIAGNOSIS — R040 Epistaxis: Secondary | ICD-10-CM | POA: Diagnosis not present

## 2023-05-04 DIAGNOSIS — E871 Hypo-osmolality and hyponatremia: Secondary | ICD-10-CM | POA: Diagnosis not present

## 2023-05-04 DIAGNOSIS — E162 Hypoglycemia, unspecified: Secondary | ICD-10-CM

## 2023-05-04 DIAGNOSIS — I10 Essential (primary) hypertension: Secondary | ICD-10-CM | POA: Diagnosis not present

## 2023-05-04 DIAGNOSIS — R1110 Vomiting, unspecified: Secondary | ICD-10-CM | POA: Diagnosis not present

## 2023-05-04 DIAGNOSIS — E875 Hyperkalemia: Secondary | ICD-10-CM | POA: Diagnosis not present

## 2023-05-04 DIAGNOSIS — E118 Type 2 diabetes mellitus with unspecified complications: Secondary | ICD-10-CM | POA: Diagnosis not present

## 2023-05-04 DIAGNOSIS — Z794 Long term (current) use of insulin: Secondary | ICD-10-CM

## 2023-05-04 DIAGNOSIS — Z91199 Patient's noncompliance with other medical treatment and regimen due to unspecified reason: Secondary | ICD-10-CM

## 2023-05-04 DIAGNOSIS — R059 Cough, unspecified: Secondary | ICD-10-CM | POA: Diagnosis not present

## 2023-05-04 DIAGNOSIS — Z4682 Encounter for fitting and adjustment of non-vascular catheter: Secondary | ICD-10-CM | POA: Diagnosis not present

## 2023-05-04 DIAGNOSIS — J189 Pneumonia, unspecified organism: Secondary | ICD-10-CM | POA: Diagnosis not present

## 2023-05-04 DIAGNOSIS — R778 Other specified abnormalities of plasma proteins: Secondary | ICD-10-CM | POA: Diagnosis not present

## 2023-05-04 DIAGNOSIS — G9341 Metabolic encephalopathy: Secondary | ICD-10-CM | POA: Diagnosis not present

## 2023-05-04 DIAGNOSIS — R0602 Shortness of breath: Secondary | ICD-10-CM | POA: Diagnosis not present

## 2023-05-04 DIAGNOSIS — R570 Cardiogenic shock: Secondary | ICD-10-CM | POA: Diagnosis not present

## 2023-05-04 DIAGNOSIS — R29818 Other symptoms and signs involving the nervous system: Secondary | ICD-10-CM | POA: Diagnosis not present

## 2023-05-04 DIAGNOSIS — R531 Weakness: Secondary | ICD-10-CM

## 2023-05-04 DIAGNOSIS — I499 Cardiac arrhythmia, unspecified: Secondary | ICD-10-CM

## 2023-05-04 DIAGNOSIS — E7800 Pure hypercholesterolemia, unspecified: Secondary | ICD-10-CM | POA: Diagnosis present

## 2023-05-04 DIAGNOSIS — J811 Chronic pulmonary edema: Secondary | ICD-10-CM | POA: Diagnosis not present

## 2023-05-04 DIAGNOSIS — R739 Hyperglycemia, unspecified: Secondary | ICD-10-CM

## 2023-05-04 DIAGNOSIS — N19 Unspecified kidney failure: Secondary | ICD-10-CM | POA: Diagnosis not present

## 2023-05-04 DIAGNOSIS — E876 Hypokalemia: Secondary | ICD-10-CM | POA: Diagnosis not present

## 2023-05-04 DIAGNOSIS — Z882 Allergy status to sulfonamides status: Secondary | ICD-10-CM

## 2023-05-04 DIAGNOSIS — R5383 Other fatigue: Secondary | ICD-10-CM

## 2023-05-04 DIAGNOSIS — F32A Depression, unspecified: Secondary | ICD-10-CM | POA: Insufficient documentation

## 2023-05-04 DIAGNOSIS — Z91148 Patient's other noncompliance with medication regimen for other reason: Secondary | ICD-10-CM

## 2023-05-04 DIAGNOSIS — N179 Acute kidney failure, unspecified: Secondary | ICD-10-CM | POA: Diagnosis not present

## 2023-05-04 DIAGNOSIS — I514 Myocarditis, unspecified: Secondary | ICD-10-CM | POA: Diagnosis not present

## 2023-05-04 DIAGNOSIS — I400 Infective myocarditis: Principal | ICD-10-CM | POA: Diagnosis present

## 2023-05-04 DIAGNOSIS — Z8249 Family history of ischemic heart disease and other diseases of the circulatory system: Secondary | ICD-10-CM

## 2023-05-04 DIAGNOSIS — I493 Ventricular premature depolarization: Secondary | ICD-10-CM | POA: Diagnosis not present

## 2023-05-04 DIAGNOSIS — E1122 Type 2 diabetes mellitus with diabetic chronic kidney disease: Secondary | ICD-10-CM | POA: Diagnosis present

## 2023-05-04 DIAGNOSIS — R682 Dry mouth, unspecified: Secondary | ICD-10-CM | POA: Diagnosis not present

## 2023-05-04 DIAGNOSIS — R579 Shock, unspecified: Secondary | ICD-10-CM | POA: Diagnosis not present

## 2023-05-04 DIAGNOSIS — D6959 Other secondary thrombocytopenia: Secondary | ICD-10-CM | POA: Diagnosis present

## 2023-05-04 DIAGNOSIS — Z5986 Financial insecurity: Secondary | ICD-10-CM

## 2023-05-04 DIAGNOSIS — G4733 Obstructive sleep apnea (adult) (pediatric): Secondary | ICD-10-CM | POA: Diagnosis present

## 2023-05-04 DIAGNOSIS — I4891 Unspecified atrial fibrillation: Secondary | ICD-10-CM

## 2023-05-04 DIAGNOSIS — I428 Other cardiomyopathies: Secondary | ICD-10-CM | POA: Diagnosis present

## 2023-05-04 DIAGNOSIS — I5021 Acute systolic (congestive) heart failure: Secondary | ICD-10-CM | POA: Diagnosis not present

## 2023-05-04 DIAGNOSIS — H5702 Anisocoria: Secondary | ICD-10-CM | POA: Diagnosis not present

## 2023-05-04 DIAGNOSIS — D631 Anemia in chronic kidney disease: Secondary | ICD-10-CM | POA: Diagnosis present

## 2023-05-04 DIAGNOSIS — Z6841 Body Mass Index (BMI) 40.0 and over, adult: Secondary | ICD-10-CM

## 2023-05-04 DIAGNOSIS — Z860101 Personal history of adenomatous and serrated colon polyps: Secondary | ICD-10-CM

## 2023-05-04 DIAGNOSIS — R6521 Severe sepsis with septic shock: Secondary | ICD-10-CM | POA: Diagnosis not present

## 2023-05-04 DIAGNOSIS — J9601 Acute respiratory failure with hypoxia: Secondary | ICD-10-CM

## 2023-05-04 DIAGNOSIS — J90 Pleural effusion, not elsewhere classified: Secondary | ICD-10-CM | POA: Diagnosis not present

## 2023-05-04 DIAGNOSIS — I071 Rheumatic tricuspid insufficiency: Secondary | ICD-10-CM | POA: Diagnosis present

## 2023-05-04 DIAGNOSIS — R42 Dizziness and giddiness: Secondary | ICD-10-CM

## 2023-05-04 DIAGNOSIS — E038 Other specified hypothyroidism: Secondary | ICD-10-CM | POA: Diagnosis present

## 2023-05-04 DIAGNOSIS — D684 Acquired coagulation factor deficiency: Secondary | ICD-10-CM | POA: Diagnosis present

## 2023-05-04 DIAGNOSIS — I483 Typical atrial flutter: Secondary | ICD-10-CM | POA: Diagnosis not present

## 2023-05-04 DIAGNOSIS — I5043 Acute on chronic combined systolic (congestive) and diastolic (congestive) heart failure: Secondary | ICD-10-CM | POA: Diagnosis not present

## 2023-05-04 DIAGNOSIS — D6489 Other specified anemias: Secondary | ICD-10-CM | POA: Diagnosis present

## 2023-05-04 DIAGNOSIS — N170 Acute kidney failure with tubular necrosis: Secondary | ICD-10-CM | POA: Diagnosis present

## 2023-05-04 DIAGNOSIS — I959 Hypotension, unspecified: Secondary | ICD-10-CM | POA: Diagnosis not present

## 2023-05-04 DIAGNOSIS — Z8042 Family history of malignant neoplasm of prostate: Secondary | ICD-10-CM

## 2023-05-04 DIAGNOSIS — A419 Sepsis, unspecified organism: Secondary | ICD-10-CM | POA: Diagnosis not present

## 2023-05-04 DIAGNOSIS — I2489 Other forms of acute ischemic heart disease: Secondary | ICD-10-CM | POA: Diagnosis present

## 2023-05-04 DIAGNOSIS — J188 Other pneumonia, unspecified organism: Secondary | ICD-10-CM

## 2023-05-04 DIAGNOSIS — R339 Retention of urine, unspecified: Secondary | ICD-10-CM | POA: Diagnosis present

## 2023-05-04 DIAGNOSIS — R590 Localized enlarged lymph nodes: Secondary | ICD-10-CM | POA: Diagnosis not present

## 2023-05-04 DIAGNOSIS — R0600 Dyspnea, unspecified: Secondary | ICD-10-CM | POA: Diagnosis not present

## 2023-05-04 DIAGNOSIS — Z5941 Food insecurity: Secondary | ICD-10-CM

## 2023-05-04 DIAGNOSIS — I2723 Pulmonary hypertension due to lung diseases and hypoxia: Secondary | ICD-10-CM | POA: Diagnosis present

## 2023-05-04 DIAGNOSIS — I5031 Acute diastolic (congestive) heart failure: Secondary | ICD-10-CM | POA: Diagnosis not present

## 2023-05-04 DIAGNOSIS — E1142 Type 2 diabetes mellitus with diabetic polyneuropathy: Secondary | ICD-10-CM | POA: Diagnosis present

## 2023-05-04 DIAGNOSIS — J9690 Respiratory failure, unspecified, unspecified whether with hypoxia or hypercapnia: Secondary | ICD-10-CM | POA: Diagnosis not present

## 2023-05-04 DIAGNOSIS — I4892 Unspecified atrial flutter: Secondary | ICD-10-CM | POA: Diagnosis not present

## 2023-05-04 DIAGNOSIS — I509 Heart failure, unspecified: Principal | ICD-10-CM

## 2023-05-04 DIAGNOSIS — G609 Hereditary and idiopathic neuropathy, unspecified: Secondary | ICD-10-CM | POA: Diagnosis present

## 2023-05-04 DIAGNOSIS — I5041 Acute combined systolic (congestive) and diastolic (congestive) heart failure: Secondary | ICD-10-CM | POA: Diagnosis not present

## 2023-05-04 DIAGNOSIS — I517 Cardiomegaly: Secondary | ICD-10-CM | POA: Diagnosis not present

## 2023-05-04 DIAGNOSIS — R140 Abdominal distension (gaseous): Secondary | ICD-10-CM | POA: Diagnosis not present

## 2023-05-04 DIAGNOSIS — J9602 Acute respiratory failure with hypercapnia: Secondary | ICD-10-CM | POA: Diagnosis not present

## 2023-05-04 DIAGNOSIS — I5023 Acute on chronic systolic (congestive) heart failure: Secondary | ICD-10-CM | POA: Diagnosis not present

## 2023-05-04 DIAGNOSIS — R918 Other nonspecific abnormal finding of lung field: Secondary | ICD-10-CM | POA: Diagnosis not present

## 2023-05-04 DIAGNOSIS — I130 Hypertensive heart and chronic kidney disease with heart failure and stage 1 through stage 4 chronic kidney disease, or unspecified chronic kidney disease: Secondary | ICD-10-CM | POA: Diagnosis present

## 2023-05-04 DIAGNOSIS — K219 Gastro-esophageal reflux disease without esophagitis: Secondary | ICD-10-CM | POA: Diagnosis not present

## 2023-05-04 DIAGNOSIS — K7200 Acute and subacute hepatic failure without coma: Secondary | ICD-10-CM | POA: Diagnosis not present

## 2023-05-04 DIAGNOSIS — J45909 Unspecified asthma, uncomplicated: Secondary | ICD-10-CM | POA: Diagnosis present

## 2023-05-04 DIAGNOSIS — R7989 Other specified abnormal findings of blood chemistry: Secondary | ICD-10-CM

## 2023-05-04 DIAGNOSIS — K567 Ileus, unspecified: Secondary | ICD-10-CM | POA: Diagnosis not present

## 2023-05-04 DIAGNOSIS — R4182 Altered mental status, unspecified: Secondary | ICD-10-CM | POA: Diagnosis not present

## 2023-05-04 DIAGNOSIS — N3091 Cystitis, unspecified with hematuria: Secondary | ICD-10-CM | POA: Diagnosis not present

## 2023-05-04 DIAGNOSIS — D649 Anemia, unspecified: Secondary | ICD-10-CM

## 2023-05-04 DIAGNOSIS — E8729 Other acidosis: Secondary | ICD-10-CM | POA: Diagnosis present

## 2023-05-04 DIAGNOSIS — E119 Type 2 diabetes mellitus without complications: Secondary | ICD-10-CM | POA: Diagnosis not present

## 2023-05-04 DIAGNOSIS — I110 Hypertensive heart disease with heart failure: Secondary | ICD-10-CM | POA: Diagnosis not present

## 2023-05-04 DIAGNOSIS — I480 Paroxysmal atrial fibrillation: Secondary | ICD-10-CM | POA: Diagnosis not present

## 2023-05-04 DIAGNOSIS — E861 Hypovolemia: Secondary | ICD-10-CM | POA: Diagnosis present

## 2023-05-04 DIAGNOSIS — G8929 Other chronic pain: Secondary | ICD-10-CM | POA: Diagnosis present

## 2023-05-04 DIAGNOSIS — J15211 Pneumonia due to Methicillin susceptible Staphylococcus aureus: Secondary | ICD-10-CM | POA: Diagnosis present

## 2023-05-04 DIAGNOSIS — Z1152 Encounter for screening for COVID-19: Secondary | ICD-10-CM

## 2023-05-04 DIAGNOSIS — M545 Low back pain, unspecified: Secondary | ICD-10-CM | POA: Diagnosis present

## 2023-05-04 DIAGNOSIS — Z79899 Other long term (current) drug therapy: Secondary | ICD-10-CM

## 2023-05-04 DIAGNOSIS — Z888 Allergy status to other drugs, medicaments and biological substances status: Secondary | ICD-10-CM

## 2023-05-04 DIAGNOSIS — R161 Splenomegaly, not elsewhere classified: Secondary | ICD-10-CM | POA: Diagnosis not present

## 2023-05-04 DIAGNOSIS — R0989 Other specified symptoms and signs involving the circulatory and respiratory systems: Secondary | ICD-10-CM | POA: Diagnosis not present

## 2023-05-04 DIAGNOSIS — K761 Chronic passive congestion of liver: Secondary | ICD-10-CM | POA: Diagnosis present

## 2023-05-04 DIAGNOSIS — Z7901 Long term (current) use of anticoagulants: Secondary | ICD-10-CM

## 2023-05-04 DIAGNOSIS — E66812 Obesity, class 2: Secondary | ICD-10-CM | POA: Diagnosis present

## 2023-05-04 DIAGNOSIS — D751 Secondary polycythemia: Secondary | ICD-10-CM | POA: Diagnosis not present

## 2023-05-04 DIAGNOSIS — I2722 Pulmonary hypertension due to left heart disease: Secondary | ICD-10-CM | POA: Diagnosis present

## 2023-05-04 LAB — CBC WITH DIFFERENTIAL/PLATELET
Abs Immature Granulocytes: 0.03 10*3/uL (ref 0.00–0.07)
Basophils Absolute: 0.1 10*3/uL (ref 0.0–0.1)
Basophils Relative: 1 %
Eosinophils Absolute: 0.1 10*3/uL (ref 0.0–0.5)
Eosinophils Relative: 1 %
HCT: 49.7 % (ref 39.0–52.0)
Hemoglobin: 15 g/dL (ref 13.0–17.0)
Immature Granulocytes: 0 %
Lymphocytes Relative: 9 %
Lymphs Abs: 0.9 10*3/uL (ref 0.7–4.0)
MCH: 26.2 pg (ref 26.0–34.0)
MCHC: 30.2 g/dL (ref 30.0–36.0)
MCV: 86.9 fL (ref 80.0–100.0)
Monocytes Absolute: 0.9 10*3/uL (ref 0.1–1.0)
Monocytes Relative: 10 %
Neutro Abs: 7.4 10*3/uL (ref 1.7–7.7)
Neutrophils Relative %: 79 %
Platelets: 248 10*3/uL (ref 150–400)
RBC: 5.72 MIL/uL (ref 4.22–5.81)
RDW: 15.8 % — ABNORMAL HIGH (ref 11.5–15.5)
WBC: 9.3 10*3/uL (ref 4.0–10.5)
nRBC: 0 % (ref 0.0–0.2)

## 2023-05-04 LAB — COMPREHENSIVE METABOLIC PANEL
ALT: 12 U/L (ref 0–44)
AST: 21 U/L (ref 15–41)
Albumin: 3.5 g/dL (ref 3.5–5.0)
Alkaline Phosphatase: 70 U/L (ref 38–126)
Anion gap: 13 (ref 5–15)
BUN: 16 mg/dL (ref 6–20)
CO2: 24 mmol/L (ref 22–32)
Calcium: 8.9 mg/dL (ref 8.9–10.3)
Chloride: 103 mmol/L (ref 98–111)
Creatinine, Ser: 1.11 mg/dL (ref 0.61–1.24)
GFR, Estimated: >60 mL/min (ref >60)
Glucose, Bld: 90 mg/dL (ref 70–99)
Potassium: 3.2 mmol/L — ABNORMAL LOW (ref 3.5–5.1)
Sodium: 140 mmol/L (ref 135–145)
Total Bilirubin: 2 mg/dL — ABNORMAL HIGH (ref 0.0–1.2)
Total Protein: 7.4 g/dL (ref 6.5–8.1)

## 2023-05-04 LAB — BRAIN NATRIURETIC PEPTIDE: B Natriuretic Peptide: 1275.2 pg/mL — ABNORMAL HIGH (ref 0.0–100.0)

## 2023-05-04 LAB — RESP PANEL BY RT-PCR (RSV, FLU A&B, COVID)  RVPGX2
Influenza A by PCR: NEGATIVE
Influenza B by PCR: NEGATIVE
Resp Syncytial Virus by PCR: NEGATIVE
SARS Coronavirus 2 by RT PCR: NEGATIVE

## 2023-05-04 MED ORDER — HYDROCHLOROTHIAZIDE 12.5 MG PO TABS
25.0000 mg | ORAL_TABLET | Freq: Once | ORAL | Status: AC
  Administered 2023-05-04: 25 mg via ORAL
  Filled 2023-05-04: qty 2

## 2023-05-04 MED ORDER — METOPROLOL TARTRATE 25 MG PO TABS
50.0000 mg | ORAL_TABLET | Freq: Once | ORAL | Status: AC
  Administered 2023-05-04: 50 mg via ORAL
  Filled 2023-05-04: qty 2

## 2023-05-04 NOTE — Progress Notes (Signed)
Established Patient Office Visit  Subjective:  Patient ID: Dylan Owen, male    DOB: [birth year]  Age: 58 y.o. MRN: [id number]  Chief Complaint  Patient presents with   Fatigue    Coughing and fatigue and wheezing for about a week now. He also has some body aches and fever.    58yo male presents with cough, sore throat, dizziness, and body aches. He is accompanied by a caregiver who provides additional information about his symptoms.  He has been experiencing symptoms for the past week, beginning with shortness of breath and cough. The symptoms have progressed to include tightness in the chest, headaches, sore throat, body aches, and dizziness. He feels as though he might pass out and experiences chills with episodes of sweating followed by feeling cold. He is unsure about having a fever but notes feeling hot and cold quickly, suggesting possible fever and chills.  He has been using Nyquil over the past week, which has provided minimal relief. He has not received a flu shot this season.  He has a history of an "irregular heartbeat since infancy", described as 'skipping a beat' occasionally. He is not currently on blood thinners but takes a diuretic for swelling, noting swelling in his ankles and face.  He reports a dry mouth, which is not typical for him, and has been drinking Diet Pepsi and some water. He has not been eating well due to altered taste, describing food as tasting 'funny' or 'terrible'.  No chronic lung disease. He has a history of smoking, though he no longer smokes. He has not been around anyone with respiratory symptoms.    Patient Active Problem List   Diagnosis Date Noted   Diabetes mellitus with complication (HCC) 10/20/2015   Type II or unspecified type diabetes mellitus with peripheral circulatory disorders, uncontrolled(250.72) 11/23/2013   Hyperlipidemia 11/23/2013   Diminished pulses in lower extremity 11/23/2013   Obesity 08/12/2011   ALLERGIC  RHINITIS 01/28/2010   DEPRESSION 06/26/2009   UNSPECIFIED VITAMIN D DEFICIENCY 07/04/2007   INTERTRIGO, CANDIDAL 01/03/2007   PERIPHERAL EDEMA 10/27/2006   SLEEP APNEA, OBSTRUCTIVE 09/14/2006   DIABETES MELLITUS, TYPE II 06/14/2006   TOBACCO ABUSE 06/14/2006   PERIPHERAL NEUROPATHY 06/14/2006   Essential hypertension 06/14/2006   ANGINA PECTORIS 06/14/2006   GERD 06/14/2006   LOW BACK PAIN 06/14/2006   ANEMIA, VITAMIN B12 DEFICIENCY NEC 05/17/2006   Past Medical History:  Diagnosis Date   B12 deficiency    Blood transfusion without reported diagnosis    Chronic low back pain    Dr. Vear Clock is pain mgmt MD   Depression    Diabetes mellitus with complication (HCC)    DPN + microalbuminuria   GERD (gastroesophageal reflux disease)    Heart murmur    History of adenomatous polyp of colon 05/22/2021   recall 7 yrs   Hypercholesterolemia    Statin intolerant.  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   Persistent asthma    Never smoker   Seizures (HCC)    Stable angina (HCC)    nl myoview 12/07  followed by Dr Eden Emms   Statin intolerance    myalgias   Subclinical hypothyroidism 06/2017   Past Surgical History:  Procedure Laterality Date   COLONOSCOPY  05/22/2021   Adenomas--recall 7 years  CYSTOSCOPY WITH RETROGRADE URETHROGRAM N/A 08/21/2021   Procedure: RETROGRADE URETHROGRAM;  Surgeon: Crist Fat, MD;  Location: [facility];  Service: Urology;  Laterality: N/A;   CYSTOSCOPY WITH URETHRAL DILATATION N/A 08/21/2021   Procedure: CYSTOSCOPY WITH URETHRAL DILATATION OPTILUME;  Surgeon: Crist Fat, MD;  Location: [facility];  Service: Urology;  Laterality: N/A;   LUMBAR SPINE SURGERY     lumbar laminectomy with hardware stabilization, with ensuing arachnoiditis   URETHRAL STRICTURE DILATATION   1996 and 2004.   Laser surgery.   Social History   Tobacco Use   Smoking status: Never   Smokeless tobacco: Current    Types: Chew   Tobacco comments:    dip  Vaping Use   Vaping status: Never Used  Substance Use Topics   Alcohol use: No   Drug use: No      ROS: as noted in HPI  Objective:     BP (!) 182/135   Pulse (!) 119   Temp 98 F (36.7 C) (Oral)   Wt (!) 328 lb (148.8 kg)   SpO2 92%   BMI 43.27 kg/m  BP Readings from Last 3 Encounters:  05/04/23 (!) 182/135  12/09/22 (!) 170/80  11/26/22 (!) 160/88   Wt Readings from Last 3 Encounters:  05/04/23 (!) 328 lb (148.8 kg)  12/09/22 (!) 338 lb (153.3 kg)  11/26/22 (!) 341 lb 9.6 oz (154.9 kg)      Physical Exam Vitals and nursing note reviewed. Exam conducted with a chaperone present.  Constitutional:      General: He is not in acute distress.    Appearance: Normal appearance. He is obese. He is ill-appearing and diaphoretic. He is not toxic-appearing.  HENT:     Head: Normocephalic and atraumatic.     Right Ear: Tympanic membrane, ear canal and external ear normal. There is no impacted cerumen.     Left Ear: Tympanic membrane, ear canal and external ear normal. There is no impacted cerumen.     Nose: No congestion or rhinorrhea.     Mouth/Throat:     Mouth: Mucous membranes are dry.     Dentition: Abnormal dentition. Dental caries present.     Tongue: Lesions (thick, beefy red tongue with brown coating) present.  Eyes:     General: No scleral icterus.       Right eye: No discharge.        Left eye: No discharge.  Cardiovascular:     Rate and Rhythm: Tachycardia present. Rhythm irregular.  Pulmonary:     Effort: Pulmonary effort is normal. No respiratory distress.     Breath sounds: No stridor. No wheezing or rhonchi.     Comments: Overall decreased breath sounds bilaterally Chest:     Chest wall: Tenderness (anterior) present.  Musculoskeletal:     Cervical back: Normal range of motion and neck  supple.     Right lower leg: Edema present.     Left lower leg: Edema present.  Skin:    General: Skin is warm.     Findings: No rash.  Neurological:     General: No focal deficit present.     Mental Status: He is oriented to person, place, and time.     EKG: sinus tachycardia -With rate variation, cv = 11. Right sided conduction defect and right axis -possible right ventricular hypertrophy or posterior fascicular block.  No results found for any visits on 05/04/23.  Last CBC Lab Results  Component Value Date  WBC 13.2 (H) 11/05/2022   HGB 14.9 11/05/2022   HCT 45.8 11/05/2022   MCV 84.0 11/05/2022   MCH 27.3 11/05/2022   RDW 12.6 11/05/2022   PLT 370 11/05/2022   Last metabolic panel Lab Results  Component Value Date   GLUCOSE 177 (H) 11/05/2022   NA 139 11/05/2022   K 5.2 11/05/2022   CL 102 11/05/2022   CO2 26 11/05/2022   BUN 13 11/05/2022   CREATININE 1.07 11/05/2022   GFR 98.00 08/05/2022   CALCIUM 8.9 11/05/2022   PHOS 2.5 08/10/2012   PROT 7.4 08/05/2022   ALBUMIN 3.6 08/05/2022   BILITOT 0.6 08/05/2022   ALKPHOS 84 08/05/2022   AST 15 08/05/2022   ALT 16 08/05/2022   ANIONGAP 6 08/19/2021   Last hemoglobin A1c Lab Results  Component Value Date   HGBA1C 9.1 (A) 11/05/2022   HGBA1C 9.1 11/05/2022   HGBA1C 9.1 (A) 11/05/2022   HGBA1C 9.1 (A) 11/05/2022      The 10-year ASCVD risk score (Arnett DK, et al., 2019) is: 22%  Assessment & Plan:  Irregular heart beat -     EKG 12-Lead  Weakness  Other fatigue  Acute cough  Dry mouth  Dizziness  Pt presents with a week worth of fatigue, weakness, dizziness, and feeling faint. He presents with symptoms of dehydration as noted by his extremely dry mouth. He has an irregular heart beat on exam, tachycardia, low O2. I am concerned about cardiac cause of his current symptoms and would like him to be assessed in the ER for further workup. Suspect pt would benefit from at least 1L of IV  fluids.  Possible Atrial Fibrillation Irregular heartbeat noted on auscultation, patient reports history of irregular heartbeat but no documented history of atrial fibrillation. -EKG in office shows sinus tachycardia, however concern for paroxysmal a.fib with RVR - recommend telemetry  Pt sent to [facility] ER for further workup and management.   No follow-ups on file.   Maretta Bees, PA

## 2023-05-04 NOTE — ED Triage Notes (Signed)
C/o cough, fatigue, sore throat, body aches, and fever x 1 week

## 2023-05-04 NOTE — ED Provider Triage Note (Signed)
Emergency Medicine Provider Triage Evaluation Note  Dylan Owen , a 58 y.o. male  was evaluated in triage.  Pt complains of high blood pressure. Patient reports he went to his PCP for sick symptoms and sent him here for elevated BP. Did not take his BP meds prior to the appointment.  Review of Systems  Positive: Elevated BP Negative: CP, SOB  Physical Exam  There were no vitals taken for this visit. Gen:   Awake, no distress   Resp:  Normal effort  MSK:   Moves extremities without difficulty Other:    Medical Decision Making  Medically screening exam initiated at 4:15 PM.  Appropriate orders placed.  Jesse Nosbisch [place] was informed that the remainder of the evaluation will be completed by another provider, this initial triage assessment does not replace that evaluation, and the importance of remaining in the ED until their evaluation is complete.    Maxwell Marion, PA-C 05/04/23 2119

## 2023-05-04 NOTE — Patient Instructions (Addendum)
Please head to [facility] Emergency Room now for a further evaluation. I feel your symptoms are severe enough to warrant a further workup that cannot be safely done through family practice.  [facility] Address: [address] [place], [place], [place] [address] Phone: [phone]

## 2023-05-05 ENCOUNTER — Inpatient Hospital Stay (HOSPITAL_COMMUNITY): Payer: PPO

## 2023-05-05 ENCOUNTER — Encounter (HOSPITAL_COMMUNITY): Payer: Self-pay | Admitting: Emergency Medicine

## 2023-05-05 DIAGNOSIS — I5043 Acute on chronic combined systolic (congestive) and diastolic (congestive) heart failure: Secondary | ICD-10-CM | POA: Diagnosis not present

## 2023-05-05 DIAGNOSIS — G9340 Encephalopathy, unspecified: Secondary | ICD-10-CM | POA: Diagnosis not present

## 2023-05-05 DIAGNOSIS — R5381 Other malaise: Secondary | ICD-10-CM | POA: Diagnosis not present

## 2023-05-05 DIAGNOSIS — R6521 Severe sepsis with septic shock: Secondary | ICD-10-CM | POA: Diagnosis not present

## 2023-05-05 DIAGNOSIS — E1122 Type 2 diabetes mellitus with diabetic chronic kidney disease: Secondary | ICD-10-CM | POA: Diagnosis not present

## 2023-05-05 DIAGNOSIS — N179 Acute kidney failure, unspecified: Secondary | ICD-10-CM | POA: Diagnosis not present

## 2023-05-05 DIAGNOSIS — I5031 Acute diastolic (congestive) heart failure: Secondary | ICD-10-CM | POA: Diagnosis not present

## 2023-05-05 DIAGNOSIS — Z6841 Body Mass Index (BMI) 40.0 and over, adult: Secondary | ICD-10-CM | POA: Diagnosis not present

## 2023-05-05 DIAGNOSIS — K7200 Acute and subacute hepatic failure without coma: Secondary | ICD-10-CM | POA: Diagnosis not present

## 2023-05-05 DIAGNOSIS — R778 Other specified abnormalities of plasma proteins: Secondary | ICD-10-CM | POA: Diagnosis not present

## 2023-05-05 DIAGNOSIS — I110 Hypertensive heart disease with heart failure: Secondary | ICD-10-CM | POA: Diagnosis not present

## 2023-05-05 DIAGNOSIS — R609 Edema, unspecified: Secondary | ICD-10-CM

## 2023-05-05 DIAGNOSIS — I509 Heart failure, unspecified: Secondary | ICD-10-CM

## 2023-05-05 DIAGNOSIS — I514 Myocarditis, unspecified: Secondary | ICD-10-CM | POA: Diagnosis not present

## 2023-05-05 DIAGNOSIS — Z1152 Encounter for screening for COVID-19: Secondary | ICD-10-CM | POA: Diagnosis not present

## 2023-05-05 DIAGNOSIS — K761 Chronic passive congestion of liver: Secondary | ICD-10-CM | POA: Diagnosis not present

## 2023-05-05 DIAGNOSIS — I5020 Unspecified systolic (congestive) heart failure: Secondary | ICD-10-CM | POA: Diagnosis not present

## 2023-05-05 DIAGNOSIS — I4892 Unspecified atrial flutter: Secondary | ICD-10-CM | POA: Diagnosis not present

## 2023-05-05 DIAGNOSIS — I483 Typical atrial flutter: Secondary | ICD-10-CM | POA: Diagnosis not present

## 2023-05-05 DIAGNOSIS — R570 Cardiogenic shock: Secondary | ICD-10-CM | POA: Diagnosis not present

## 2023-05-05 DIAGNOSIS — J9602 Acute respiratory failure with hypercapnia: Secondary | ICD-10-CM | POA: Diagnosis not present

## 2023-05-05 DIAGNOSIS — J189 Pneumonia, unspecified organism: Secondary | ICD-10-CM | POA: Diagnosis not present

## 2023-05-05 DIAGNOSIS — J15211 Pneumonia due to Methicillin susceptible Staphylococcus aureus: Secondary | ICD-10-CM | POA: Diagnosis not present

## 2023-05-05 DIAGNOSIS — R7989 Other specified abnormal findings of blood chemistry: Secondary | ICD-10-CM | POA: Diagnosis not present

## 2023-05-05 DIAGNOSIS — D631 Anemia in chronic kidney disease: Secondary | ICD-10-CM | POA: Diagnosis not present

## 2023-05-05 DIAGNOSIS — I4891 Unspecified atrial fibrillation: Secondary | ICD-10-CM | POA: Diagnosis not present

## 2023-05-05 DIAGNOSIS — A419 Sepsis, unspecified organism: Secondary | ICD-10-CM | POA: Diagnosis not present

## 2023-05-05 DIAGNOSIS — I10 Essential (primary) hypertension: Secondary | ICD-10-CM | POA: Diagnosis not present

## 2023-05-05 DIAGNOSIS — J45909 Unspecified asthma, uncomplicated: Secondary | ICD-10-CM | POA: Diagnosis not present

## 2023-05-05 DIAGNOSIS — G4733 Obstructive sleep apnea (adult) (pediatric): Secondary | ICD-10-CM | POA: Diagnosis not present

## 2023-05-05 DIAGNOSIS — I2489 Other forms of acute ischemic heart disease: Secondary | ICD-10-CM | POA: Diagnosis not present

## 2023-05-05 DIAGNOSIS — I5023 Acute on chronic systolic (congestive) heart failure: Secondary | ICD-10-CM | POA: Diagnosis not present

## 2023-05-05 DIAGNOSIS — F32A Depression, unspecified: Secondary | ICD-10-CM | POA: Diagnosis not present

## 2023-05-05 DIAGNOSIS — I400 Infective myocarditis: Secondary | ICD-10-CM | POA: Diagnosis not present

## 2023-05-05 DIAGNOSIS — Z794 Long term (current) use of insulin: Secondary | ICD-10-CM | POA: Diagnosis not present

## 2023-05-05 DIAGNOSIS — I5021 Acute systolic (congestive) heart failure: Secondary | ICD-10-CM | POA: Diagnosis not present

## 2023-05-05 DIAGNOSIS — J9601 Acute respiratory failure with hypoxia: Secondary | ICD-10-CM | POA: Diagnosis not present

## 2023-05-05 DIAGNOSIS — D6959 Other secondary thrombocytopenia: Secondary | ICD-10-CM | POA: Diagnosis not present

## 2023-05-05 DIAGNOSIS — I5041 Acute combined systolic (congestive) and diastolic (congestive) heart failure: Secondary | ICD-10-CM | POA: Diagnosis not present

## 2023-05-05 DIAGNOSIS — R4182 Altered mental status, unspecified: Secondary | ICD-10-CM | POA: Diagnosis not present

## 2023-05-05 DIAGNOSIS — I071 Rheumatic tricuspid insufficiency: Secondary | ICD-10-CM | POA: Diagnosis not present

## 2023-05-05 DIAGNOSIS — R0602 Shortness of breath: Secondary | ICD-10-CM | POA: Diagnosis not present

## 2023-05-05 DIAGNOSIS — E038 Other specified hypothyroidism: Secondary | ICD-10-CM | POA: Diagnosis not present

## 2023-05-05 DIAGNOSIS — G9341 Metabolic encephalopathy: Secondary | ICD-10-CM | POA: Diagnosis not present

## 2023-05-05 DIAGNOSIS — E118 Type 2 diabetes mellitus with unspecified complications: Secondary | ICD-10-CM | POA: Diagnosis not present

## 2023-05-05 DIAGNOSIS — N170 Acute kidney failure with tubular necrosis: Secondary | ICD-10-CM | POA: Diagnosis not present

## 2023-05-05 DIAGNOSIS — I130 Hypertensive heart and chronic kidney disease with heart failure and stage 1 through stage 4 chronic kidney disease, or unspecified chronic kidney disease: Secondary | ICD-10-CM | POA: Diagnosis not present

## 2023-05-05 DIAGNOSIS — E8779 Other fluid overload: Secondary | ICD-10-CM | POA: Diagnosis not present

## 2023-05-05 DIAGNOSIS — R0989 Other specified symptoms and signs involving the circulatory and respiratory systems: Secondary | ICD-10-CM | POA: Diagnosis not present

## 2023-05-05 DIAGNOSIS — R579 Shock, unspecified: Secondary | ICD-10-CM | POA: Diagnosis not present

## 2023-05-05 DIAGNOSIS — D649 Anemia, unspecified: Secondary | ICD-10-CM | POA: Diagnosis not present

## 2023-05-05 DIAGNOSIS — J90 Pleural effusion, not elsewhere classified: Secondary | ICD-10-CM | POA: Diagnosis not present

## 2023-05-05 DIAGNOSIS — I2722 Pulmonary hypertension due to left heart disease: Secondary | ICD-10-CM | POA: Diagnosis not present

## 2023-05-05 DIAGNOSIS — E1165 Type 2 diabetes mellitus with hyperglycemia: Secondary | ICD-10-CM | POA: Diagnosis not present

## 2023-05-05 LAB — BASIC METABOLIC PANEL
Anion gap: 11 (ref 5–15)
BUN: 18 mg/dL (ref 6–20)
CO2: 28 mmol/L (ref 22–32)
Calcium: 8.7 mg/dL — ABNORMAL LOW (ref 8.9–10.3)
Chloride: 99 mmol/L (ref 98–111)
Creatinine, Ser: 1.17 mg/dL (ref 0.61–1.24)
GFR, Estimated: >60 mL/min (ref >60)
Glucose, Bld: 104 mg/dL — ABNORMAL HIGH (ref 70–99)
Potassium: 3.2 mmol/L — ABNORMAL LOW (ref 3.5–5.1)
Sodium: 138 mmol/L (ref 135–145)

## 2023-05-05 LAB — CBC WITH DIFFERENTIAL/PLATELET
Abs Immature Granulocytes: 0.04 10*3/uL (ref 0.00–0.07)
Basophils Absolute: 0.1 10*3/uL (ref 0.0–0.1)
Basophils Relative: 1 %
Eosinophils Absolute: 0 10*3/uL (ref 0.0–0.5)
Eosinophils Relative: 0 %
HCT: 48.9 % (ref 39.0–52.0)
Hemoglobin: 14.8 g/dL (ref 13.0–17.0)
Immature Granulocytes: 0 %
Lymphocytes Relative: 15 %
Lymphs Abs: 1.4 10*3/uL (ref 0.7–4.0)
MCH: 26.5 pg (ref 26.0–34.0)
MCHC: 30.3 g/dL (ref 30.0–36.0)
MCV: 87.5 fL (ref 80.0–100.0)
Monocytes Absolute: 0.9 10*3/uL (ref 0.1–1.0)
Monocytes Relative: 10 %
Neutro Abs: 6.9 10*3/uL (ref 1.7–7.7)
Neutrophils Relative %: 74 %
Platelets: 262 10*3/uL (ref 150–400)
RBC: 5.59 MIL/uL (ref 4.22–5.81)
RDW: 15.8 % — ABNORMAL HIGH (ref 11.5–15.5)
WBC: 9.4 10*3/uL (ref 4.0–10.5)
nRBC: 0 % (ref 0.0–0.2)

## 2023-05-05 LAB — URINALYSIS, ROUTINE W REFLEX MICROSCOPIC
Bacteria, UA: NONE SEEN
Bilirubin Urine: NEGATIVE
Glucose, UA: NEGATIVE mg/dL
Ketones, ur: NEGATIVE mg/dL
Nitrite: NEGATIVE
Protein, ur: 100 mg/dL — AB
RBC / HPF: >50 RBC/hpf (ref 0–5)
Specific Gravity, Urine: 1.006 (ref 1.005–1.030)
pH: 7 (ref 5.0–8.0)

## 2023-05-05 LAB — ECHOCARDIOGRAM COMPLETE
Area-P 1/2: 9.37 cm2
Calc EF: 32.2 %
Height: 73 in
S' Lateral: 4.3 cm
Single Plane A2C EF: 21.6 %
Single Plane A4C EF: 37.7 %
Weight: 5248 [oz_av]

## 2023-05-05 LAB — HIV ANTIBODY (ROUTINE TESTING W REFLEX): HIV Screen 4th Generation wRfx: NONREACTIVE

## 2023-05-05 LAB — GLUCOSE, CAPILLARY
Glucose-Capillary: 63 mg/dL — ABNORMAL LOW (ref 70–99)
Glucose-Capillary: 91 mg/dL (ref 70–99)

## 2023-05-05 LAB — HEPATIC FUNCTION PANEL
ALT: 11 U/L (ref 0–44)
AST: 22 U/L (ref 15–41)
Albumin: 3.2 g/dL — ABNORMAL LOW (ref 3.5–5.0)
Alkaline Phosphatase: 67 U/L (ref 38–126)
Bilirubin, Direct: 0.9 mg/dL — ABNORMAL HIGH (ref 0.0–0.2)
Indirect Bilirubin: 1 mg/dL — ABNORMAL HIGH (ref 0.3–0.9)
Total Bilirubin: 1.9 mg/dL — ABNORMAL HIGH (ref 0.0–1.2)
Total Protein: 7 g/dL (ref 6.5–8.1)

## 2023-05-05 LAB — CBG MONITORING, ED
Glucose-Capillary: 87 mg/dL (ref 70–99)
Glucose-Capillary: 70 mg/dL (ref 70–99)
Glucose-Capillary: 58 mg/dL — ABNORMAL LOW (ref 70–99)
Glucose-Capillary: 90 mg/dL (ref 70–99)

## 2023-05-05 LAB — TROPONIN I (HIGH SENSITIVITY)
Troponin I (High Sensitivity): 30 ng/L — ABNORMAL HIGH (ref <18)
Troponin I (High Sensitivity): 27 ng/L — ABNORMAL HIGH (ref <18)

## 2023-05-05 LAB — TSH: TSH: 1.853 u[IU]/mL (ref 0.350–4.500)

## 2023-05-05 LAB — MAGNESIUM: Magnesium: 1.7 mg/dL (ref 1.7–2.4)

## 2023-05-05 LAB — HEMOGLOBIN A1C
Hgb A1c MFr Bld: 10.2 % — ABNORMAL HIGH (ref 4.8–5.6)
Mean Plasma Glucose: 246.04 mg/dL

## 2023-05-05 MED ORDER — METOPROLOL TARTRATE 50 MG PO TABS
50.0000 mg | ORAL_TABLET | Freq: Two times a day (BID) | ORAL | Status: DC
  Administered 2023-05-05 – 2023-05-06 (×3): 50 mg via ORAL
  Filled 2023-05-05 (×2): qty 1
  Filled 2023-05-05: qty 2

## 2023-05-05 MED ORDER — FUROSEMIDE 10 MG/ML IJ SOLN
40.0000 mg | INTRAMUSCULAR | Status: AC
  Administered 2023-05-05: 40 mg via INTRAVENOUS
  Filled 2023-05-05: qty 4

## 2023-05-05 MED ORDER — ENOXAPARIN SODIUM 80 MG/0.8ML IJ SOSY
70.0000 mg | PREFILLED_SYRINGE | INTRAMUSCULAR | Status: DC
  Administered 2023-05-06 – 2023-05-09 (×4): 70 mg via SUBCUTANEOUS
  Filled 2023-05-05 (×4): qty 0.7
  Filled 2023-05-05: qty 0.8

## 2023-05-05 MED ORDER — POTASSIUM CHLORIDE CRYS ER 20 MEQ PO TBCR
40.0000 meq | EXTENDED_RELEASE_TABLET | Freq: Every day | ORAL | Status: DC
  Administered 2023-05-05 – 2023-05-06 (×2): 40 meq via ORAL
  Filled 2023-05-05 (×3): qty 2

## 2023-05-05 MED ORDER — ALBUTEROL SULFATE (2.5 MG/3ML) 0.083% IN NEBU: 2.5000 mg | INHALATION_SOLUTION | Freq: Four times a day (QID) | RESPIRATORY_TRACT | Status: DC | PRN

## 2023-05-05 MED ORDER — ACETAMINOPHEN 650 MG RE SUPP
650.0000 mg | Freq: Four times a day (QID) | RECTAL | Status: DC | PRN
  Administered 2023-05-07 – 2023-05-08 (×2): 650 mg via RECTAL
  Filled 2023-05-05 (×2): qty 1

## 2023-05-05 MED ORDER — VENLAFAXINE HCL ER 150 MG PO CP24
150.0000 mg | ORAL_CAPSULE | Freq: Two times a day (BID) | ORAL | Status: DC
  Administered 2023-05-05 – 2023-05-06 (×4): 150 mg via ORAL
  Filled 2023-05-05 (×2): qty 1
  Filled 2023-05-05: qty 2
  Filled 2023-05-05 (×4): qty 1

## 2023-05-05 MED ORDER — INSULIN ASPART 100 UNIT/ML IJ SOLN
0.0000 [IU] | Freq: Three times a day (TID) | INTRAMUSCULAR | Status: DC
  Administered 2023-05-06: 1 [IU] via SUBCUTANEOUS
  Administered 2023-05-07: 2 [IU] via SUBCUTANEOUS
  Filled 2023-05-05: qty 0.09

## 2023-05-05 MED ORDER — POTASSIUM CHLORIDE 20 MEQ PO PACK
40.0000 meq | PACK | Freq: Once | ORAL | Status: AC
  Administered 2023-05-05: 40 meq via ORAL
  Filled 2023-05-05: qty 2

## 2023-05-05 MED ORDER — METHOCARBAMOL 500 MG PO TABS: 500.0000 mg | ORAL_TABLET | Freq: Two times a day (BID) | ORAL | Status: DC | PRN

## 2023-05-05 MED ORDER — ACETAMINOPHEN 325 MG PO TABS
650.0000 mg | ORAL_TABLET | Freq: Four times a day (QID) | ORAL | Status: DC | PRN
  Administered 2023-05-06: 650 mg via ORAL
  Filled 2023-05-05: qty 2

## 2023-05-05 MED ORDER — INSULIN GLARGINE-YFGN 100 UNIT/ML ~~LOC~~ SOLN
20.0000 [IU] | Freq: Every day | SUBCUTANEOUS | Status: DC
  Administered 2023-05-06: 20 [IU] via SUBCUTANEOUS
  Filled 2023-05-05: qty 0.2

## 2023-05-05 MED ORDER — INSULIN DEGLUDEC 100 UNIT/ML ~~LOC~~ SOPN: 70.0000 [IU] | PEN_INJECTOR | Freq: Two times a day (BID) | SUBCUTANEOUS | Status: DC

## 2023-05-05 MED ORDER — MORPHINE SULFATE ER 30 MG PO TBCR
60.0000 mg | EXTENDED_RELEASE_TABLET | Freq: Two times a day (BID) | ORAL | Status: DC
  Administered 2023-05-05 – 2023-05-06 (×4): 60 mg via ORAL
  Filled 2023-05-05 (×3): qty 2
  Filled 2023-05-05: qty 4

## 2023-05-05 MED ORDER — INSULIN GLARGINE-YFGN 100 UNIT/ML ~~LOC~~ SOLN
30.0000 [IU] | Freq: Every day | SUBCUTANEOUS | Status: DC
  Administered 2023-05-05: 30 [IU] via SUBCUTANEOUS
  Filled 2023-05-05: qty 0.3

## 2023-05-05 MED ORDER — MORPHINE SULFATE ER 15 MG PO TBCR
60.0000 mg | EXTENDED_RELEASE_TABLET | Freq: Once | ORAL | Status: AC
  Administered 2023-05-05: 60 mg via ORAL
  Filled 2023-05-05: qty 4

## 2023-05-05 MED ORDER — MORPHINE SULFATE ER 15 MG PO TBCR: 30.0000 mg | EXTENDED_RELEASE_TABLET | ORAL | Status: DC

## 2023-05-05 MED ORDER — PANTOPRAZOLE SODIUM 40 MG PO TBEC
40.0000 mg | DELAYED_RELEASE_TABLET | Freq: Every day | ORAL | Status: DC
  Administered 2023-05-05 – 2023-05-06 (×2): 40 mg via ORAL
  Filled 2023-05-05 (×3): qty 1

## 2023-05-05 MED ORDER — PERFLUTREN LIPID MICROSPHERE
1.0000 mL | INTRAVENOUS | Status: AC | PRN
  Administered 2023-05-05: 1 mL via INTRAVENOUS

## 2023-05-05 MED ORDER — LISINOPRIL 20 MG PO TABS
40.0000 mg | ORAL_TABLET | Freq: Every day | ORAL | Status: DC
  Administered 2023-05-05: 40 mg via ORAL
  Filled 2023-05-05: qty 4

## 2023-05-05 MED ORDER — MORPHINE SULFATE ER 30 MG PO TBCR
30.0000 mg | EXTENDED_RELEASE_TABLET | ORAL | Status: DC
  Administered 2023-05-05 – 2023-05-06 (×2): 30 mg via ORAL
  Filled 2023-05-05: qty 1
  Filled 2023-05-05 (×2): qty 2

## 2023-05-05 MED ORDER — INSULIN GLARGINE-YFGN 100 UNIT/ML ~~LOC~~ SOLN
30.0000 [IU] | Freq: Two times a day (BID) | SUBCUTANEOUS | Status: DC
  Filled 2023-05-05: qty 0.3

## 2023-05-05 MED ORDER — FUROSEMIDE 10 MG/ML IJ SOLN
60.0000 mg | Freq: Two times a day (BID) | INTRAMUSCULAR | Status: DC
  Administered 2023-05-05 – 2023-05-06 (×3): 60 mg via INTRAVENOUS
  Filled 2023-05-05: qty 6
  Filled 2023-05-05: qty 8
  Filled 2023-05-05: qty 6
  Filled 2023-05-05: qty 8

## 2023-05-05 MED ORDER — NITROGLYCERIN 0.4 MG SL SUBL: 0.4000 mg | SUBLINGUAL_TABLET | SUBLINGUAL | Status: DC | PRN

## 2023-05-05 NOTE — ED Provider Notes (Signed)
[place] EMERGENCY DEPARTMENT AT [facility] Provider Note   CSN: [id number] Arrival date & time: 05/04/23  1543     History  Chief Complaint  Patient presents with   Shortness of Breath    Dylan Owen is a 58 y.o. male.  The history is provided by the patient and medical records.   58 year old male with history of hypertension, GERD, hyperlipidemia, diabetes, chronic pain, presenting to the ED from PCP office for abnormal EKG and elevated blood pressure readings.  Patient reports he has been feeling unwell for about a week now.  States he is a little bit of sore throat, cough, generalized fatigue.  He denies fever.  States he has had a lot of trouble breathing, particularly with exertion.  He ambulates with cane at baseline and states he is having to stop multiple times just while walking through the house.  Has also had some increased lower extremity edema and nighttime orthopnea.  He does take Lasix and has been compliant with this.  Was previously seeing cardiology for hyperlipidemia, however has been lost to follow-up.  He has no prior history of heart failure.  Has had a little chest discomfort, thought maybe due to coughing recently.  Home Medications Prior to Admission medications   Medication Sig Start Date End Date Taking? Authorizing Provider  Acetaminophen (TYLENOL PO) Take 500 mg by mouth daily as needed (pain).    [provider]  albuterol (VENTOLIN HFA) 108 (90 Base) MCG/ACT inhaler Inhale 1-2 puffs into the lungs every 6 (six) hours as needed for wheezing or shortness of breath. 11/09/22   McGowen, Maryjean Morn, MD  B-D ULTRAFINE III SHORT PEN 31G X 8 MM MISC USE AS DIRECTED TWICE DAILY FOR MEDICATION INJECTION 09/21/21   McGowen, Maryjean Morn, MD  blood glucose meter kit and supplies KIT Dispense based on patient and insurance preference. Use to check glucose 4 times daily. 10/22/19   McGowen, Maryjean Morn, MD  Continuous Glucose Receiver (FREESTYLE LIBRE 3  READER) DEVI Use to check glucose continuously 11/09/22   McGowen, Maryjean Morn, MD  Continuous Glucose Sensor (FREESTYLE LIBRE 3 SENSOR) MISC Place 1 sensor on the skin every 14 days. Use to check glucose continuously 11/09/22   McGowen, Maryjean Morn, MD  esomeprazole (NEXIUM) 40 MG capsule Take 1 capsule (40 mg total) by mouth daily. 11/05/22   McGowen, Maryjean Morn, MD  Evolocumab (REPATHA SURECLICK) 140 MG/ML SOAJ INJECT 1 DOSE INTO THE SKIN EVERY 14 (FOURTEEN) DAYS. 09/21/22   Hilty, Lisette Abu, MD  FREESTYLE TEST STRIPS test strip USE TO CHECK GLUCOSE 4 TIMES DAILY. 05/05/21   McGowen, Maryjean Morn, MD  furosemide (LASIX) 20 MG tablet TAKE 1 TABLET BY MOUTH EVERY MORNING AS NEEDED FOR SWELLING 04/16/22   McGowen, Maryjean Morn, MD  hydrochlorothiazide (HYDRODIURIL) 25 MG tablet Take 1 tablet (25 mg total) by mouth daily. 11/05/22 11/05/23  McGowenMaryjean Morn, MD  insulin aspart (NOVOLOG FLEXPEN) 100 UNIT/ML FlexPen 3 units SQ before breakfast, lunch, and dinner 11/26/22   McGowen, Maryjean Morn, MD  insulin degludec (TRESIBA FLEXTOUCH) 100 UNIT/ML FlexTouch Pen 80 U qAM and 72 U qPM 12/09/22   McGowen, Maryjean Morn, MD  lisinopril (ZESTRIL) 40 MG tablet Take 1 tablet (40 mg total) by mouth daily. 11/05/22   McGowen, Maryjean Morn, MD  methocarbamol (ROBAXIN) 500 MG tablet Take 500 mg by mouth 2 (two) times daily as needed for muscle spasms. 10/05/19   [provider]  metoprolol tartrate (LOPRESSOR) 50  MG tablet TAKE 1 TABLET BY MOUTH TWICE A DAY. OFFICE VISIT NEEDED FOR FURTHER REFILLS 08/05/22   McGowen, Maryjean Morn, MD  morphine (MS CONTIN) 30 MG 12 hr tablet Take 30-60 mg by mouth See admin instructions. Taking 60 mg in the AM and 30 mg in the afternoon and 60 mg at bedtime 10/05/19   [provider]  Multiple Vitamin (MULTIVITAMIN) tablet Take 1 tablet by mouth daily.    [provider]  nitroGLYCERIN (NITROSTAT) 0.4 MG SL tablet PLACE 1 TABLET UNDER THE TONGUE EVERY 5 MINUTES AS NEEDED. 08/07/21   McGowen, Maryjean Morn, MD   venlafaxine XR (EFFEXOR-XR) 150 MG 24 hr capsule Take 150 mg by mouth 2 (two) times daily. 09/26/15   [provider]      Allergies    Ace inhibitors, Atorvastatin, Prednisone, Septra [sulfamethoxazole-trimethoprim], and Metformin and related    Review of Systems   Review of Systems  Respiratory:  Positive for shortness of breath.   Cardiovascular:  Positive for chest pain and leg swelling.  All other systems reviewed and are negative.   Physical Exam Updated Vital Signs BP (!) 157/110   Pulse 61   Temp (!) 97.5 F (36.4 C)   Resp 20   SpO2 96%   Physical Exam Vitals and nursing note reviewed.  Constitutional:      Appearance: He is well-developed.  HENT:     Head: Normocephalic and atraumatic.  Eyes:     Conjunctiva/sclera: Conjunctivae normal.     Pupils: Pupils are equal, round, and reactive to light.  Cardiovascular:     Rate and Rhythm: Normal rate and regular rhythm.     Heart sounds: Normal heart sounds.  Pulmonary:     Effort: Pulmonary effort is normal.     Breath sounds: Normal breath sounds.  Abdominal:     General: Bowel sounds are normal.     Palpations: Abdomen is soft.  Musculoskeletal:        General: Normal range of motion.     Cervical back: Normal range of motion.     Comments: 2+ pitting edema of bilateral lower extremities, grossly symmetric, no overlying erythema or induration  Skin:    General: Skin is warm and dry.  Neurological:     Mental Status: He is alert and oriented to person, place, and time.     ED Results / Procedures / Treatments   Labs (all labs ordered are listed, but only abnormal results are displayed) Labs Reviewed  COMPREHENSIVE METABOLIC PANEL - Abnormal; Notable for the following components:      Result Value   Potassium 3.2 (*)    Total Bilirubin 2.0 (*)    All other components within normal limits  CBC WITH DIFFERENTIAL/PLATELET - Abnormal; Notable for the following components:   RDW 15.8 (*)    All  other components within normal limits  BRAIN NATRIURETIC PEPTIDE - Abnormal; Notable for the following components:   B Natriuretic Peptide 1,275.2 (*)    All other components within normal limits  TROPONIN I (HIGH SENSITIVITY) - Abnormal; Notable for the following components:   Troponin I (High Sensitivity) 30 (*)    All other components within normal limits  RESP PANEL BY RT-PCR (RSV, FLU A&B, COVID)  [id number]  TROPONIN I (HIGH SENSITIVITY)    EKG EKG Interpretation Date/Time:  Wednesday May 04 2023 16:34:40 EST Ventricular Rate:  107 PR Interval:  175 QRS Duration:  108 QT Interval:  389 QTC Calculation: 519 R  Axis:   122  Text Interpretation: Sinus tachycardia Multiple premature complexes, vent & supraven Probable right ventricular hypertrophy Prolonged QT interval Baseline wander in lead(s) I Confirmed by Zadie Rhine ([id number]) on 05/05/2023 1:21:34 AM  Radiology DG Chest 2 View Result Date: 05/04/2023 CLINICAL DATA:  Short of breath EXAM: CHEST - 2 VIEW COMPARISON:  None Available. FINDINGS: Normal cardiac silhouette. Central venous congestion. Small pleural effusions. No overt pulmonary edema. No focal consolidation. No pneumothorax. No acute osseous abnormality. IMPRESSION: Central venous congestion and small pleural effusions. Electronically Signed   By: Genevive Bi M.D.   On: 05/04/2023 18:00    Procedures Procedures    Medications Ordered in ED Medications  metoprolol tartrate (LOPRESSOR) tablet 50 mg (50 mg Oral Given 05/04/23 1632)  hydrochlorothiazide (HYDRODIURIL) tablet 25 mg (25 mg Oral Given 05/04/23 1632)  furosemide (LASIX) injection 40 mg (40 mg Intravenous Given 05/05/23 0226)  morphine (MS CONTIN) 12 hr tablet 60 mg (60 mg Oral Given 05/05/23 0226)    ED Course/ Medical Decision Making/ A&P                                 Medical Decision Making Amount and/or Complexity of Data Reviewed Labs: ordered. Radiology: ordered and independent  interpretation performed. ECG/medicine tests: ordered and independent interpretation performed.  Risk Prescription drug management. Decision regarding hospitalization.   58 year old male sent in from PCP due to elevated blood pressure readings.  Feeling unwell for about the past week, some shortness of breath, dyspnea on exertion, nighttime orthopnea, and increased lower extremity edema.  He is on Lasix for lower extremity edema but has no formal diagnosis of heart failure.  Patient does appear fluid overloaded on exam.  BP has improved from time of arrival with home meds.  BNP 1275.  Trop 30, suspect likely some demand ischemia in setting of new CHF.  CXR with vascular congestion but he is not hypoxic or requiring supplemental oxygen.  He was given dose of Lasix here.  He will require admission, likely needs formal echo in the morning.  Discussed with Dr. Toniann Fail-- will admit for ongoing care.  Final Clinical Impression(s) / ED Diagnoses Final diagnoses:  Acute congestive heart failure, unspecified heart failure type [facility])  Elevated troponin    Rx / DC Orders ED Discharge Orders     None         Garlon Hatchet, PA-C 05/05/23 1914    Zadie Rhine, MD 05/05/23 [phone]

## 2023-05-05 NOTE — Progress Notes (Signed)
PROGRESS NOTE    Dylan Owen  ZOX:[id number] DOB: 01-23-1966 DOA: 05/04/2023 PCP: Jeoffrey Massed, MD    Brief Narrative:  Dylan Owen is a 58 y.o. male with history of hypertension, hyperlipidemia, diabetes mellitus type 2, sleep apnea, chronic back pain presents to the ER because of increasing shortness of breath.  Patient states over the last 1 week patient has been progressively getting short of breath on exertion with chest pressure.  Patient states he has been compliant with his home medications.  Patient visited his primary care physician's office and was referred to the ER.  Patient states he has gained at least 10 pounds in the last few weeks.  Has increasing edema of both lower extremities with 3+ edema.   Assessment and Plan: Acute CHF -    patient's symptoms are concerning for new onset CHF.   -Has significant peripheral edema and also edema and a chest x-ray.  - BNP is elevated at 1200.   - Lasix 60 mg IV every 12 for 4 doses.   - Check 2D echo. -strict I/Os and daily weights  Hypertension uncontrolled  -likely contributing to patient's symptoms.  - Continue lisinopril, metoprolol and IV Lasix presently  Diabetes mellitus type 2  -patient states he takes [place] 80 units in the morning and 70 at night.  But pharmacist says that he has not recently refilled the medicines.   -For now keeping insulin glargine 30 units daily and need to confirm with patient's wife about patient's insulin dosing   Chronic low back pain  on morphine MS 3 times a day.  Take Robaxin for spasms.  GERD  on PPI.  Depression  -on venlafaxine.  Sleep apnea  -noncompliant with CPAP.  Elevated troponin  - Likely from CHF.  Check 2D echo   Hyperlipidemia  -on the [place].  Obesity Estimated body mass index is 43.27 kg/m as calculated from the following:   Height as of 02/04/22: 6\' 1"  (1.854 m).   Weight as of this encounter: 148.8 kg.         DVT prophylaxis:      Code Status: Full Code  Disposition Plan:  Level of care: Telemetry Status is: Inpatient Remains inpatient appropriate     Consultants:  none   Subjective: Asking about his pain meds  Objective: Vitals:   05/05/23 0445 05/05/23 0650 05/05/23 0703 05/05/23 0738  BP: (!) 139/103 (!) 174/103 (!) 174/103   Pulse: 78     Resp: 16 18    Temp:    97.8 F (36.6 C)  TempSrc:  Oral  Oral  SpO2: 91% 94%    Weight:  (!) 148.8 kg      Intake/Output Summary (Last 24 hours) at 05/05/2023 0810 Last data filed at 05/05/2023 8119 Gross per 24 hour  Intake --  Output 500 ml  Net -500 ml   Filed Weights   05/05/23 0650  Weight: (!) 148.8 kg    Examination:   General: Appearance:    Severely obese older than stated age male in no acute distress     Lungs:     respirations unlabored  Heart:    Normal heart rate.     MS:   All extremities are intact.    Neurologic:   Awake, alert       Data Reviewed: I have personally reviewed following labs and imaging studies  CBC: Recent Labs  Lab 05/04/23 1645 05/05/23 0346  WBC 9.3 9.4  NEUTROABS 7.4  6.9  HGB 15.0 14.8  HCT 49.7 48.9  MCV 86.9 87.5  PLT 248 262   Basic Metabolic Panel: Recent Labs  Lab 05/04/23 1645 05/05/23 0633  NA 140 138  K 3.2* 3.2*  CL 103 99  CO2 24 28  GLUCOSE 90 104*  BUN 16 18  CREATININE 1.11 1.17  CALCIUM 8.9 8.7*  MG  --  1.7   GFR: CrCl cannot be calculated (Unknown ideal weight.). Liver Function Tests: Recent Labs  Lab 05/04/23 1645 05/05/23 0633  AST 21 22  ALT 12 11  ALKPHOS 70 67  BILITOT 2.0* 1.9*  PROT 7.4 7.0  ALBUMIN 3.5 3.2*   No results for input(s): "LIPASE", "AMYLASE" in the last 168 hours. No results for input(s): "AMMONIA" in the last 168 hours. Coagulation Profile: No results for input(s): "INR", "PROTIME" in the last 168 hours. Cardiac Enzymes: No results for input(s): "CKTOTAL", "CKMB", "CKMBINDEX", "TROPONINI" in the last 168 hours. BNP (last 3  results) No results for input(s): "PROBNP" in the last 8760 hours. HbA1C: No results for input(s): "HGBA1C" in the last 72 hours. CBG: Recent Labs  Lab 05/05/23 0733  GLUCAP 87   Lipid Profile: No results for input(s): "CHOL", "HDL", "LDLCALC", "TRIG", "CHOLHDL", "LDLDIRECT" in the last 72 hours. Thyroid Function Tests: No results for input(s): "TSH", "T4TOTAL", "FREET4", "T3FREE", "THYROIDAB" in the last 72 hours. Anemia Panel: No results for input(s): "VITAMINB12", "FOLATE", "FERRITIN", "TIBC", "IRON", "RETICCTPCT" in the last 72 hours. Sepsis Labs: No results for input(s): "PROCALCITON", "LATICACIDVEN" in the last 168 hours.  Recent Results (from the past 240 hours)  Resp panel by RT-PCR (RSV, Flu A&B, Covid) Anterior Nasal Swab     Status: None   Collection Time: 05/04/23  4:45 PM   Specimen: Anterior Nasal Swab  Result Value Ref Range Status   SARS Coronavirus 2 by RT PCR NEGATIVE NEGATIVE Final    Comment: (NOTE) SARS-CoV-2 target nucleic acids are NOT DETECTED.  The SARS-CoV-2 RNA is generally detectable in upper respiratory specimens during the acute phase of infection. The lowest concentration of SARS-CoV-2 viral copies this assay can detect is 138 copies/mL. A negative result does not preclude SARS-Cov-2 infection and should not be used as the sole basis for treatment or other patient management decisions. A negative result may occur with  improper specimen collection/handling, submission of specimen other than nasopharyngeal swab, presence of viral mutation(s) within the areas targeted by this assay, and inadequate number of viral copies(<138 copies/mL). A negative result must be combined with clinical observations, patient history, and epidemiological information. The expected result is Negative.  Fact Sheet for Patients:  BloggerCourse.com  Fact Sheet for Healthcare Providers:  SeriousBroker.it  This test is  no t yet approved or cleared by the [place] FDA and  has been authorized for detection and/or diagnosis of SARS-CoV-2 by FDA under an Emergency Use Authorization (EUA). This EUA will remain  in effect (meaning this test can be used) for the duration of the COVID-19 declaration under Section 564(b)(1) of the Act, 21 U.S.C.section 360bbb-3(b)(1), unless the authorization is terminated  or revoked sooner.       Influenza A by PCR NEGATIVE NEGATIVE Final   Influenza B by PCR NEGATIVE NEGATIVE Final    Comment: (NOTE) The Xpert Xpress SARS-CoV-2/FLU/RSV plus assay is intended as an aid in the diagnosis of influenza from Nasopharyngeal swab specimens and should not be used as a sole basis for treatment. Nasal washings and aspirates are unacceptable for Xpert Xpress SARS-CoV-2/FLU/RSV  testing.  Fact Sheet for Patients: BloggerCourse.com  Fact Sheet for Healthcare Providers: SeriousBroker.it  This test is not yet approved or cleared by the [place] FDA and has been authorized for detection and/or diagnosis of SARS-CoV-2 by FDA under an Emergency Use Authorization (EUA). This EUA will remain in effect (meaning this test can be used) for the duration of the COVID-19 declaration under Section 564(b)(1) of the Act, 21 U.S.C. section 360bbb-3(b)(1), unless the authorization is terminated or revoked.     Resp Syncytial Virus by PCR NEGATIVE NEGATIVE Final    Comment: (NOTE) Fact Sheet for Patients: BloggerCourse.com  Fact Sheet for Healthcare Providers: SeriousBroker.it  This test is not yet approved or cleared by the [place] FDA and has been authorized for detection and/or diagnosis of SARS-CoV-2 by FDA under an Emergency Use Authorization (EUA). This EUA will remain in effect (meaning this test can be used) for the duration of the COVID-19 declaration under Section  564(b)(1) of the Act, 21 U.S.C. section 360bbb-3(b)(1), unless the authorization is terminated or revoked.  Performed at [facility], [address], [place], [place] [address]          Radiology Studies: DG Chest 2 View Result Date: 05/04/2023 CLINICAL DATA:  Short of breath EXAM: CHEST - 2 VIEW COMPARISON:  None Available. FINDINGS: Normal cardiac silhouette. Central venous congestion. Small pleural effusions. No overt pulmonary edema. No focal consolidation. No pneumothorax. No acute osseous abnormality. IMPRESSION: Central venous congestion and small pleural effusions. Electronically Signed   By: Genevive Bi M.D.   On: 05/04/2023 18:00        Scheduled Meds:  enoxaparin (LOVENOX) injection  70 mg Subcutaneous Q24H   furosemide  60 mg Intravenous Q12H   insulin aspart  0-9 Units Subcutaneous TID WC   insulin glargine-yfgn  30 Units Subcutaneous Daily   lisinopril  40 mg Oral Daily   metoprolol tartrate  50 mg Oral BID   morphine  60 mg Oral BID   And   morphine  30 mg Oral Q24H   pantoprazole  40 mg Oral Daily   venlafaxine XR  150 mg Oral BID   Continuous Infusions:   LOS: 0 days    Time spent: 45 minutes spent on chart review, discussion with nursing staff, consultants, updating family and interview/physical exam; more than 50% of that time was spent in counseling and/or coordination of care.    Joseph Art, DO [facility] Available via Epic secure chat 7am-7pm After these hours, please refer to coverage provider listed on [email or web address] 05/05/2023, 8:10 AM

## 2023-05-05 NOTE — H&P (Addendum)
History and Physical    Dylan Owen WNU:[id number] DOB: 1965-12-12 DOA: 05/04/2023  Patient coming from: Home.  Chief Complaint: Shortness of breath.  HPI: Dylan Owen is a 58 y.o. male with history of hypertension, hyperlipidemia, diabetes mellitus type 2, sleep apnea, chronic back pain presents to the ER because of increasing shortness of breath.  Patient states over the last 1 week patient has been progressively getting short of breath on exertion with chest pressure.  Patient states he has been compliant with his home medications.  Patient visited his primary care physician's office and was referred to the ER.  Patient states he has gained at least 10 pounds in the last few weeks.  Has increasing edema of both lower extremities with 3+ edema.  ED Course: In the ER on exam patient has elevated JVD and bilateral 3+ edema with chest x-ray showing bilateral pleural effusion.  BNP was 1200 and troponins were 30 and 27.  Creatinine is 1.1.  Patient was given 40 mg IV Lasix and admitted for possible new onset CHF.    Review of Systems: As per HPI, rest all negative.   Past Medical History:  Diagnosis Date   B12 deficiency    Blood transfusion without reported diagnosis    Chronic low back pain    Dr. Vear Clock is pain mgmt MD   Depression    Diabetes mellitus with complication (HCC)    DPN + microalbuminuria   GERD (gastroesophageal reflux disease)    Heart murmur    History of adenomatous polyp of colon 05/22/2021   recall 7 yrs   Hypercholesterolemia    Statin intolerant.  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   Persistent asthma    Never smoker   Seizures (HCC)    Stable angina (HCC)    nl myoview 12/07  followed by Dr Eden Emms   Statin intolerance    myalgias   Subclinical hypothyroidism  06/2017    Past Surgical History:  Procedure Laterality Date   COLONOSCOPY  05/22/2021   Adenomas--recall 7 years   CYSTOSCOPY WITH RETROGRADE URETHROGRAM N/A 08/21/2021   Procedure: RETROGRADE URETHROGRAM;  Surgeon: Crist Fat, MD;  Location: [facility];  Service: Urology;  Laterality: N/A;   CYSTOSCOPY WITH URETHRAL DILATATION N/A 08/21/2021   Procedure: CYSTOSCOPY WITH URETHRAL DILATATION OPTILUME;  Surgeon: Crist Fat, MD;  Location: [facility];  Service: Urology;  Laterality: N/A;   LUMBAR SPINE SURGERY     lumbar laminectomy with hardware stabilization, with ensuing arachnoiditis   URETHRAL STRICTURE DILATATION  1996 and 2004.   Laser surgery.     reports that he has never smoked. His smokeless tobacco use includes chew. He reports that he does not drink alcohol and does not use drugs.  Allergies  Allergen Reactions   Ace Inhibitors     Kidney failure   Atorvastatin Other (See Comments)    Arm pain   Prednisone     REACTION: Anxiety   Septra [Sulfamethoxazole-Trimethoprim]    Metformin And Related Diarrhea    Family History  Problem Relation Age of Onset   Cancer Paternal Grandfather        prostate   Heart disease Other    Hypertension Other    Other Other        Emotional  Illness   Colon cancer Neg Hx    Esophageal cancer Neg Hx    Rectal cancer Neg Hx    Stomach cancer Neg Hx     Prior to Admission medications   Medication Sig Start Date End Date Taking? Authorizing Provider  Acetaminophen (TYLENOL PO) Take 500 mg by mouth daily as needed (pain).   Yes [provider]  albuterol (VENTOLIN HFA) 108 (90 Base) MCG/ACT inhaler Inhale 1-2 puffs into the lungs every 6 (six) hours as needed for wheezing or shortness of breath. 11/09/22  Yes McGowen, Maryjean Morn, MD  esomeprazole (NEXIUM) 40 MG capsule Take 1 capsule (40 mg total) by mouth daily. 11/05/22  Yes McGowen, Maryjean Morn, MD  Evolocumab (REPATHA SURECLICK) 140 MG/ML SOAJ INJECT 1 DOSE INTO THE SKIN  EVERY 14 (FOURTEEN) DAYS. 09/21/22  Yes Hilty, Lisette Abu, MD  furosemide (LASIX) 20 MG tablet TAKE 1 TABLET BY MOUTH EVERY MORNING AS NEEDED FOR SWELLING 04/16/22  Yes McGowen, Maryjean Morn, MD  hydrochlorothiazide (HYDRODIURIL) 25 MG tablet Take 1 tablet (25 mg total) by mouth daily. 11/05/22 11/05/23 Yes McGowen, Maryjean Morn, MD  insulin aspart (NOVOLOG FLEXPEN) 100 UNIT/ML FlexPen 3 units SQ before breakfast, lunch, and dinner Patient taking differently: Inject 3 Units into the skin 3 (three) times daily as needed for high blood sugar. 3 units SQ before breakfast, lunch, and dinner 11/26/22  Yes McGowen, Maryjean Morn, MD  insulin degludec (TRESIBA FLEXTOUCH) 100 UNIT/ML FlexTouch Pen 80 U qAM and 72 U qPM 12/09/22  Yes McGowen, Maryjean Morn, MD  lisinopril (ZESTRIL) 40 MG tablet Take 1 tablet (40 mg total) by mouth daily. 11/05/22  Yes McGowen, Maryjean Morn, MD  methocarbamol (ROBAXIN) 500 MG tablet Take 500 mg by mouth 2 (two) times daily as needed for muscle spasms. 10/05/19  Yes [provider]  metoprolol tartrate (LOPRESSOR) 50 MG tablet TAKE 1 TABLET BY MOUTH TWICE A DAY. OFFICE VISIT NEEDED FOR FURTHER REFILLS 08/05/22  Yes McGowen, Maryjean Morn, MD  morphine (MS CONTIN) 30 MG 12 hr tablet Take 30-60 mg by mouth See admin instructions. Taking 60 mg in the AM and 30 mg in the afternoon and 60 mg at bedtime 10/05/19  Yes [provider]  Multiple Vitamin (MULTIVITAMIN) tablet Take 1 tablet by mouth daily.   Yes [provider]  nitroGLYCERIN (NITROSTAT) 0.4 MG SL tablet PLACE 1 TABLET UNDER THE TONGUE EVERY 5 MINUTES AS NEEDED. 08/07/21  Yes McGowen, Maryjean Morn, MD  venlafaxine XR (EFFEXOR-XR) 150 MG 24 hr capsule Take 150 mg by mouth 2 (two) times daily. 09/26/15  Yes [provider]  B-D ULTRAFINE III SHORT PEN 31G X 8 MM MISC USE AS DIRECTED TWICE DAILY FOR MEDICATION INJECTION 09/21/21   McGowen, Maryjean Morn, MD  blood glucose meter kit and supplies KIT Dispense based on patient and insurance  preference. Use to check glucose 4 times daily. 10/22/19   McGowen, Maryjean Morn, MD  Continuous Glucose Receiver (FREESTYLE LIBRE 3 READER) DEVI Use to check glucose continuously 11/09/22   McGowen, Maryjean Morn, MD  Continuous Glucose Sensor (FREESTYLE LIBRE 3 SENSOR) MISC Place 1 sensor on the skin every 14 days. Use to check glucose continuously 11/09/22   McGowen, Maryjean Morn, MD  FREESTYLE TEST STRIPS test strip USE TO CHECK GLUCOSE 4 TIMES DAILY. 05/05/21   McGowen, Maryjean Morn, MD    Physical Exam: Constitutional: Moderately built and nourished. Vitals:   05/04/23 1618 05/04/23 1841 05/05/23 0130 05/05/23 0445  BP: (!) 175/100 Marland Kitchen)  165/106 (!) 157/110 (!) 139/103  Pulse: (!) 105 84 61 78  Resp: 20 16 20 16   Temp: 98 F (36.7 C) 97.9 F (36.6 C) (!) 97.5 F (36.4 C)   TempSrc:  Oral    SpO2: 95% 99% 96% 91%   Eyes: Anicteric no pallor. ENMT: No discharge from the ears eyes nose or mouth. Neck: JVD elevated no mass felt. Respiratory: No rhonchi or crepitations. Cardiovascular: S1-S2 heard. Abdomen: Soft nontender bowel sounds present. Musculoskeletal: Bilateral lower extremity edema present. Skin: No rash. Neurologic: Alert awake oriented to time place and person.  Moves all extremities. Psychiatric: Appears normal.  Normal affect.   Labs on Admission: I have personally reviewed following labs and imaging studies  CBC: Recent Labs  Lab 05/04/23 1645  WBC 9.3  NEUTROABS 7.4  HGB 15.0  HCT 49.7  MCV 86.9  PLT 248   Basic Metabolic Panel: Recent Labs  Lab 05/04/23 1645  NA 140  K 3.2*  CL 103  CO2 24  GLUCOSE 90  BUN 16  CREATININE 1.11  CALCIUM 8.9   GFR: CrCl cannot be calculated (Unknown ideal weight.). Liver Function Tests: Recent Labs  Lab 05/04/23 1645  AST 21  ALT 12  ALKPHOS 70  BILITOT 2.0*  PROT 7.4  ALBUMIN 3.5   No results for input(s): "LIPASE", "AMYLASE" in the last 168 hours. No results for input(s): "AMMONIA" in the last 168 hours. Coagulation  Profile: No results for input(s): "INR", "PROTIME" in the last 168 hours. Cardiac Enzymes: No results for input(s): "CKTOTAL", "CKMB", "CKMBINDEX", "TROPONINI" in the last 168 hours. BNP (last 3 results) No results for input(s): "PROBNP" in the last 8760 hours. HbA1C: No results for input(s): "HGBA1C" in the last 72 hours. CBG: No results for input(s): "GLUCAP" in the last 168 hours. Lipid Profile: No results for input(s): "CHOL", "HDL", "LDLCALC", "TRIG", "CHOLHDL", "LDLDIRECT" in the last 72 hours. Thyroid Function Tests: No results for input(s): "TSH", "T4TOTAL", "FREET4", "T3FREE", "THYROIDAB" in the last 72 hours. Anemia Panel: No results for input(s): "VITAMINB12", "FOLATE", "FERRITIN", "TIBC", "IRON", "RETICCTPCT" in the last 72 hours. Urine analysis:    Component Value Date/Time   COLORURINE YELLOW 10/09/2011 1553   APPEARANCEUR CLEAR 10/09/2011 1553   LABSPEC 1.026 10/09/2011 1553   PHURINE 6.0 10/09/2011 1553   GLUCOSEU >1000 (A) 10/09/2011 1553   GLUCOSEU NEG mg/dL 11/91/4782 9562   HGBUR NEGATIVE 10/09/2011 1553   HGBUR trace-lysed 11/29/2006 1045   BILIRUBINUR NEGATIVE 10/09/2011 1553   BILIRUBINUR negative 10/13/2010 0716   KETONESUR NEGATIVE 10/09/2011 1553   PROTEINUR NEGATIVE 10/09/2011 1553   UROBILINOGEN 0.2 10/09/2011 1553   NITRITE NEGATIVE 10/09/2011 1553   LEUKOCYTESUR NEGATIVE 10/09/2011 1553   Sepsis Labs: @LABRCNTIP (procalcitonin:4,lacticidven:4) ) Recent Results (from the past 240 hours)  Resp panel by RT-PCR (RSV, Flu A&B, Covid) Anterior Nasal Swab     Status: None   Collection Time: 05/04/23  4:45 PM   Specimen: Anterior Nasal Swab  Result Value Ref Range Status   SARS Coronavirus 2 by RT PCR NEGATIVE NEGATIVE Final    Comment: (NOTE) SARS-CoV-2 target nucleic acids are NOT DETECTED.  The SARS-CoV-2 RNA is generally detectable in upper respiratory specimens during the acute phase of infection. The lowest concentration of SARS-CoV-2 viral  copies this assay can detect is 138 copies/mL. A negative result does not preclude SARS-Cov-2 infection and should not be used as the sole basis for treatment or other patient management decisions. A negative result may occur with  improper specimen  collection/handling, submission of specimen other than nasopharyngeal swab, presence of viral mutation(s) within the areas targeted by this assay, and inadequate number of viral copies(<138 copies/mL). A negative result must be combined with clinical observations, patient history, and epidemiological information. The expected result is Negative.  Fact Sheet for Patients:  BloggerCourse.com  Fact Sheet for Healthcare Providers:  SeriousBroker.it  This test is no t yet approved or cleared by the [place] FDA and  has been authorized for detection and/or diagnosis of SARS-CoV-2 by FDA under an Emergency Use Authorization (EUA). This EUA will remain  in effect (meaning this test can be used) for the duration of the COVID-19 declaration under Section 564(b)(1) of the Act, 21 U.S.C.section 360bbb-3(b)(1), unless the authorization is terminated  or revoked sooner.       Influenza A by PCR NEGATIVE NEGATIVE Final   Influenza B by PCR NEGATIVE NEGATIVE Final    Comment: (NOTE) The Xpert Xpress SARS-CoV-2/FLU/RSV plus assay is intended as an aid in the diagnosis of influenza from Nasopharyngeal swab specimens and should not be used as a sole basis for treatment. Nasal washings and aspirates are unacceptable for Xpert Xpress SARS-CoV-2/FLU/RSV testing.  Fact Sheet for Patients: BloggerCourse.com  Fact Sheet for Healthcare Providers: SeriousBroker.it  This test is not yet approved or cleared by the [place] FDA and has been authorized for detection and/or diagnosis of SARS-CoV-2 by FDA under an Emergency Use Authorization (EUA). This  EUA will remain in effect (meaning this test can be used) for the duration of the COVID-19 declaration under Section 564(b)(1) of the Act, 21 U.S.C. section 360bbb-3(b)(1), unless the authorization is terminated or revoked.     Resp Syncytial Virus by PCR NEGATIVE NEGATIVE Final    Comment: (NOTE) Fact Sheet for Patients: BloggerCourse.com  Fact Sheet for Healthcare Providers: SeriousBroker.it  This test is not yet approved or cleared by the [place] FDA and has been authorized for detection and/or diagnosis of SARS-CoV-2 by FDA under an Emergency Use Authorization (EUA). This EUA will remain in effect (meaning this test can be used) for the duration of the COVID-19 declaration under Section 564(b)(1) of the Act, 21 U.S.C. section 360bbb-3(b)(1), unless the authorization is terminated or revoked.  Performed at [facility], [address], [place], [place] [address]      Radiological Exams on Admission: DG Chest 2 View Result Date: 05/04/2023 CLINICAL DATA:  Short of breath EXAM: CHEST - 2 VIEW COMPARISON:  None Available. FINDINGS: Normal cardiac silhouette. Central venous congestion. Small pleural effusions. No overt pulmonary edema. No focal consolidation. No pneumothorax. No acute osseous abnormality. IMPRESSION: Central venous congestion and small pleural effusions. Electronically Signed   By: Genevive Bi M.D.   On: 05/04/2023 18:00    EKG: Independently reviewed.  Sinus tachycardia.  Assessment/Plan Principal Problem:   Acute CHF (congestive heart failure) (HCC) Active Problems:   SLEEP APNEA, OBSTRUCTIVE   PERIPHERAL NEUROPATHY   Essential hypertension   LOW BACK PAIN   Diabetes mellitus with complication (HCC)    Acute CHF -    patient's symptoms are concerning for new onset CHF.  Has significant peripheral edema and also edema and a chest x-ray.  BNP is elevated at 1200.  Patient does  take 20 mg Lasix at home.  Did receive 40 mg in the ER I have dosed Lasix 60 mg IV every 12 for 4 doses.  Based on the response further doses.  Closely follow intake output metabolic panel daily weights.  Check 2D echo. Hypertension uncontrolled likely contributing to patient's symptoms.  Continue lisinopril metoprolol and IV Lasix presently.  Takes hydrochlorothiazide at home. Diabetes mellitus type 2 patient states he takes [place] 80 units in the morning and 70 at night.  But pharmacist says that he has not recently refilled the medicines.  For now keeping insulin glargine 30 units daily and need to confirm with patient's wife about patient's insulin dosing in the morning.  I was unable to reach patient's wife. Chronic low back pain on morphine MS 3 times a day.  Take Robaxin for spasms. GERD on PPI. Depression on venlafaxine. Sleep apnea noncompliant with CPAP. Elevated troponin but remains flat.  Likely from CHF.  Check 2D echo.  At the time of my exam patient is chest pain-free. Hyperlipidemia on the Repatha.  Since patient has acute CHF will need close monitoring further IV diuresis and more than 2 midnights stay in inpatient status.   DVT prophylaxis: Lovenox. Code Status: Full code. Family Communication: Unable to reach patient's wife. Disposition Plan: Cardiac telemetry. Consults called: None. Admission status: Inpatient.

## 2023-05-05 NOTE — Progress Notes (Signed)
Hypoglycemic Event  CBG: 63  Treatment: 4 oz juice/soda  Symptoms: None  Follow-up CBG: Time:2320 CBG Result:91  Possible Reasons for Event: Unknown   Dylan Owen C [name]

## 2023-05-05 NOTE — ED Notes (Signed)
Sent down set of save tubes if AM labs are ordered.

## 2023-05-05 NOTE — Progress Notes (Signed)
Lower extremity venous duplex completed. Please see CV Procedures for preliminary results.  Shona Simpson, RVT 05/05/23 10:19 AM

## 2023-05-06 ENCOUNTER — Inpatient Hospital Stay (HOSPITAL_COMMUNITY): Payer: PPO

## 2023-05-06 DIAGNOSIS — I5021 Acute systolic (congestive) heart failure: Secondary | ICD-10-CM | POA: Diagnosis not present

## 2023-05-06 DIAGNOSIS — E118 Type 2 diabetes mellitus with unspecified complications: Secondary | ICD-10-CM | POA: Diagnosis not present

## 2023-05-06 DIAGNOSIS — I10 Essential (primary) hypertension: Secondary | ICD-10-CM | POA: Diagnosis not present

## 2023-05-06 DIAGNOSIS — I5041 Acute combined systolic (congestive) and diastolic (congestive) heart failure: Secondary | ICD-10-CM | POA: Diagnosis not present

## 2023-05-06 LAB — BLOOD GAS, VENOUS
Acid-Base Excess: 2.5 mmol/L — ABNORMAL HIGH (ref 0.0–2.0)
Bicarbonate: 32 mmol/L — ABNORMAL HIGH (ref 20.0–28.0)
O2 Saturation: 28.4 %
Patient temperature: 37
pCO2, Ven: 73 mm[Hg] (ref 44–60)
pH, Ven: 7.25 (ref 7.25–7.43)
pO2, Ven: <31 mm[Hg] — CL (ref 32–45)

## 2023-05-06 LAB — BASIC METABOLIC PANEL
Anion gap: 14 (ref 5–15)
Anion gap: 18 — ABNORMAL HIGH (ref 5–15)
BUN: 21 mg/dL — ABNORMAL HIGH (ref 6–20)
BUN: 26 mg/dL — ABNORMAL HIGH (ref 6–20)
CO2: 27 mmol/L (ref 22–32)
CO2: 24 mmol/L (ref 22–32)
Calcium: 8.9 mg/dL (ref 8.9–10.3)
Calcium: 8.9 mg/dL (ref 8.9–10.3)
Chloride: 96 mmol/L — ABNORMAL LOW (ref 98–111)
Chloride: 97 mmol/L — ABNORMAL LOW (ref 98–111)
Creatinine, Ser: 1.57 mg/dL — ABNORMAL HIGH (ref 0.61–1.24)
Creatinine, Ser: 2.45 mg/dL — ABNORMAL HIGH (ref 0.61–1.24)
GFR, Estimated: 51 mL/min — ABNORMAL LOW (ref >60)
GFR, Estimated: 30 mL/min — ABNORMAL LOW (ref >60)
Glucose, Bld: 86 mg/dL (ref 70–99)
Glucose, Bld: 99 mg/dL (ref 70–99)
Potassium: 4 mmol/L (ref 3.5–5.1)
Potassium: 4.5 mmol/L (ref 3.5–5.1)
Sodium: 137 mmol/L (ref 135–145)
Sodium: 139 mmol/L (ref 135–145)

## 2023-05-06 LAB — COMPREHENSIVE METABOLIC PANEL
ALT: 252 U/L — ABNORMAL HIGH (ref 0–44)
AST: 625 U/L — ABNORMAL HIGH (ref 15–41)
Albumin: 3.4 g/dL — ABNORMAL LOW (ref 3.5–5.0)
Alkaline Phosphatase: 70 U/L (ref 38–126)
Anion gap: 18 — ABNORMAL HIGH (ref 5–15)
BUN: 27 mg/dL — ABNORMAL HIGH (ref 6–20)
CO2: 25 mmol/L (ref 22–32)
Calcium: 9 mg/dL (ref 8.9–10.3)
Chloride: 95 mmol/L — ABNORMAL LOW (ref 98–111)
Creatinine, Ser: 2.82 mg/dL — ABNORMAL HIGH (ref 0.61–1.24)
GFR, Estimated: 25 mL/min — ABNORMAL LOW (ref >60)
Glucose, Bld: 68 mg/dL — ABNORMAL LOW (ref 70–99)
Potassium: 4.7 mmol/L (ref 3.5–5.1)
Sodium: 138 mmol/L (ref 135–145)
Total Bilirubin: 3.1 mg/dL — ABNORMAL HIGH (ref 0.0–1.2)
Total Protein: 7.6 g/dL (ref 6.5–8.1)

## 2023-05-06 LAB — CBC
HCT: 53.3 % — ABNORMAL HIGH (ref 39.0–52.0)
Hemoglobin: 16.1 g/dL (ref 13.0–17.0)
MCH: 26.9 pg (ref 26.0–34.0)
MCHC: 30.2 g/dL (ref 30.0–36.0)
MCV: 89 fL (ref 80.0–100.0)
Platelets: 272 10*3/uL (ref 150–400)
RBC: 5.99 MIL/uL — ABNORMAL HIGH (ref 4.22–5.81)
RDW: 16.9 % — ABNORMAL HIGH (ref 11.5–15.5)
WBC: 9.9 10*3/uL (ref 4.0–10.5)
nRBC: 0 % (ref 0.0–0.2)

## 2023-05-06 LAB — CBC WITH DIFFERENTIAL/PLATELET
Abs Immature Granulocytes: 0.17 10*3/uL — ABNORMAL HIGH (ref 0.00–0.07)
Basophils Absolute: 0.1 10*3/uL (ref 0.0–0.1)
Basophils Relative: 1 %
Eosinophils Absolute: 0 10*3/uL (ref 0.0–0.5)
Eosinophils Relative: 0 %
HCT: 57.6 % — ABNORMAL HIGH (ref 39.0–52.0)
Hemoglobin: 16.8 g/dL (ref 13.0–17.0)
Immature Granulocytes: 1 %
Lymphocytes Relative: 6 %
Lymphs Abs: 0.7 10*3/uL (ref 0.7–4.0)
MCH: 26.1 pg (ref 26.0–34.0)
MCHC: 29.2 g/dL — ABNORMAL LOW (ref 30.0–36.0)
MCV: 89.6 fL (ref 80.0–100.0)
Monocytes Absolute: 2.3 10*3/uL — ABNORMAL HIGH (ref 0.1–1.0)
Monocytes Relative: 18 %
Neutro Abs: 9.5 10*3/uL — ABNORMAL HIGH (ref 1.7–7.7)
Neutrophils Relative %: 74 %
Platelets: 316 10*3/uL (ref 150–400)
RBC: 6.43 MIL/uL — ABNORMAL HIGH (ref 4.22–5.81)
RDW: 17.7 % — ABNORMAL HIGH (ref 11.5–15.5)
WBC: 12.8 10*3/uL — ABNORMAL HIGH (ref 4.0–10.5)
nRBC: 0 % (ref 0.0–0.2)

## 2023-05-06 LAB — GLUCOSE, CAPILLARY
Glucose-Capillary: 103 mg/dL — ABNORMAL HIGH (ref 70–99)
Glucose-Capillary: 112 mg/dL — ABNORMAL HIGH (ref 70–99)
Glucose-Capillary: 121 mg/dL — ABNORMAL HIGH (ref 70–99)
Glucose-Capillary: 132 mg/dL — ABNORMAL HIGH (ref 70–99)
Glucose-Capillary: 140 mg/dL — ABNORMAL HIGH (ref 70–99)
Glucose-Capillary: 152 mg/dL — ABNORMAL HIGH (ref 70–99)
Glucose-Capillary: 154 mg/dL — ABNORMAL HIGH (ref 70–99)
Glucose-Capillary: 45 mg/dL — ABNORMAL LOW (ref 70–99)
Glucose-Capillary: 53 mg/dL — ABNORMAL LOW (ref 70–99)
Glucose-Capillary: 59 mg/dL — ABNORMAL LOW (ref 70–99)
Glucose-Capillary: 61 mg/dL — ABNORMAL LOW (ref 70–99)
Glucose-Capillary: 61 mg/dL — ABNORMAL LOW (ref 70–99)
Glucose-Capillary: 61 mg/dL — ABNORMAL LOW (ref 70–99)
Glucose-Capillary: 76 mg/dL (ref 70–99)
Glucose-Capillary: 79 mg/dL (ref 70–99)
Glucose-Capillary: 83 mg/dL (ref 70–99)
Glucose-Capillary: 84 mg/dL (ref 70–99)
Glucose-Capillary: 85 mg/dL (ref 70–99)
Glucose-Capillary: 90 mg/dL (ref 70–99)
Glucose-Capillary: 98 mg/dL (ref 70–99)
Glucose-Capillary: 99 mg/dL (ref 70–99)

## 2023-05-06 LAB — LACTIC ACID, PLASMA: Lactic Acid, Venous: 3.7 mmol/L (ref 0.5–1.9)

## 2023-05-06 LAB — RESPIRATORY PANEL BY PCR

## 2023-05-06 LAB — TROPONIN I (HIGH SENSITIVITY): Troponin I (High Sensitivity): 38 ng/L — ABNORMAL HIGH (ref <18)

## 2023-05-06 MED ORDER — DEXTROSE 10 % IV SOLN: INTRAVENOUS | Status: DC

## 2023-05-06 MED ORDER — SODIUM CHLORIDE 0.9 % IV SOLN
2.0000 g | Freq: Once | INTRAVENOUS | Status: AC
  Administered 2023-05-07: 2 g via INTRAVENOUS
  Filled 2023-05-06: qty 12.5

## 2023-05-06 MED ORDER — DEXTROSE 50 % IV SOLN
INTRAVENOUS | Status: AC
  Filled 2023-05-06: qty 50

## 2023-05-06 MED ORDER — FUROSEMIDE 10 MG/ML IJ SOLN
60.0000 mg | Freq: Two times a day (BID) | INTRAMUSCULAR | Status: DC
  Administered 2023-05-07: 60 mg via INTRAVENOUS
  Filled 2023-05-06: qty 6

## 2023-05-06 MED ORDER — DEXTROSE 50 % IV SOLN
12.5000 g | INTRAVENOUS | Status: AC
  Administered 2023-05-06: 12.5 g via INTRAVENOUS

## 2023-05-06 MED ORDER — DEXTROSE 50 % IV SOLN
50.0000 mL | Freq: Once | INTRAVENOUS | Status: AC
  Administered 2023-05-06: 50 mL via INTRAVENOUS

## 2023-05-06 MED ORDER — DEXTROSE 50 % IV SOLN
25.0000 mL | Freq: Once | INTRAVENOUS | Status: AC
  Administered 2023-05-06: 25 mL via INTRAVENOUS

## 2023-05-06 MED ORDER — INSULIN GLARGINE-YFGN 100 UNIT/ML ~~LOC~~ SOLN
10.0000 [IU] | Freq: Every day | SUBCUTANEOUS | Status: DC
  Filled 2023-05-06: qty 0.1

## 2023-05-06 MED ORDER — METOPROLOL SUCCINATE ER 25 MG PO TB24
25.0000 mg | ORAL_TABLET | Freq: Every day | ORAL | Status: DC
  Filled 2023-05-06: qty 1

## 2023-05-06 MED ORDER — EMPAGLIFLOZIN 10 MG PO TABS
10.0000 mg | ORAL_TABLET | Freq: Every day | ORAL | Status: DC
Start: 2023-05-07 — End: 2023-05-07
  Filled 2023-05-06: qty 1

## 2023-05-06 MED ORDER — LACTATED RINGERS IV BOLUS: 250.0000 mL | Freq: Once | INTRAVENOUS | Status: DC

## 2023-05-06 MED ORDER — SODIUM CHLORIDE 0.9 % IV SOLN: 2.0000 g | Freq: Two times a day (BID) | INTRAVENOUS | Status: DC

## 2023-05-06 MED ORDER — ORAL CARE MOUTH RINSE: 15.0000 mL | OROMUCOSAL | Status: DC | PRN

## 2023-05-06 MED ORDER — ASPIRIN 81 MG PO CHEW
81.0000 mg | CHEWABLE_TABLET | Freq: Every day | ORAL | Status: DC
  Administered 2023-05-06 – 2023-05-10 (×2): 81 mg via ORAL
  Filled 2023-05-06 (×3): qty 1

## 2023-05-06 MED ORDER — METRONIDAZOLE 500 MG/100ML IV SOLN
500.0000 mg | Freq: Two times a day (BID) | INTRAVENOUS | Status: DC
  Administered 2023-05-07: 500 mg via INTRAVENOUS
  Filled 2023-05-06: qty 100

## 2023-05-06 MED ORDER — CHLORHEXIDINE GLUCONATE CLOTH 2 % EX PADS
6.0000 | MEDICATED_PAD | Freq: Every day | CUTANEOUS | Status: DC
  Administered 2023-05-07 – 2023-05-29 (×23): 6 via TOPICAL

## 2023-05-06 MED ORDER — SPIRONOLACTONE 12.5 MG HALF TABLET
12.5000 mg | ORAL_TABLET | Freq: Every day | ORAL | Status: DC
  Administered 2023-05-06: 12.5 mg via ORAL
  Filled 2023-05-06 (×2): qty 1

## 2023-05-06 MED ORDER — DEXTROSE-SODIUM CHLORIDE 5-0.45 % IV SOLN
INTRAVENOUS | Status: DC
Start: 2023-05-06 — End: 2023-05-06

## 2023-05-06 NOTE — Progress Notes (Signed)
Hypoglycemic Event  CBG: 59  Treatment: D50 25 mL (12.5 gm)  Symptoms: Pale and Sweaty  Follow-up CBG: Time:2350 CBG Result:112  Possible Reasons for Event: Unknown  Comments/MD notified:    Randel Books

## 2023-05-06 NOTE — Progress Notes (Signed)
Hypoglycemic Event  CBG: 61  Treatment: 8 oz juice/soda  Symptoms: None  Follow-up CBG: Time:1630 CBG Result:53  Possible Reasons for Event: Inadequate meal intake  Comments/MD notified:Dr. Viviana Simpler, Cathlean Cower

## 2023-05-06 NOTE — Progress Notes (Signed)
Hypoglycemic Event  CBG: 45  Treatment: 8 oz juice/soda x 2  Symptoms: Vision changes   Follow-up CBG: Time:0340 CBG Result:154  Possible Reasons for Event: Unknown   Dymon Summerhill C Cashmere Dingley

## 2023-05-06 NOTE — Progress Notes (Signed)
Patient with Hypoglycemic event. Blood Sugar @ 1604 pm 61. Patient alert and oriented x4. Appropriate snack given per Protocol. Recheck Blood Sugar @ 1630 53. MD aware  and 1/2 amp D50 IV per new orders. Blood Sugar recheck @ 1700 with result of 61.  MD made aware and the the other of D 50 given IV per MD orders. Blood Sugar recheck 1723 now 99. Patient has dinner tray ordered

## 2023-05-06 NOTE — Progress Notes (Signed)
Pharmacy Antibiotic Note  Dylan Owen is a 58 y.o. male admitted on 05/04/2023 with sepsis.  Pharmacy has been consulted for Cefepime dosing.  Plan: Cefepime 2gm IV q12h Monitor renal function and cx data   Height: 6\' 1"  (185.4 cm) Weight: (!) 145.6 kg (320 lb 15.8 oz) IBW/kg (Calculated) : 79.9  Temp (24hrs), Avg:99.7 F (37.6 C), Min:97.6 F (36.4 C), Max:102.2 F (39 C)  Recent Labs  Lab 05/04/23 1645 05/05/23 0346 05/05/23 0633 05/06/23 0504 05/06/23 2037 05/06/23 2226  WBC 9.3 9.4  --  9.9  --  12.8*  CREATININE 1.11  --  1.17 1.57* 2.45* 2.82*  LATICACIDVEN  --   --   --   --   --  3.7*    Estimated Creatinine Clearance: 43.4 mL/min (A) (by C-G formula based on SCr of 2.82 mg/dL (H)).    Allergies  Allergen Reactions   Ace Inhibitors     Kidney failure   Atorvastatin Other (See Comments)    Arm pain   Prednisone     REACTION: Anxiety   Septra [Sulfamethoxazole-Trimethoprim]    Metformin And Related Diarrhea    Antimicrobials this admission: 2/1 Cefepime >>  2/1 Metronidazole >>   Dose adjustments this admission:  Microbiology results: BCx:  MRSA PCR:  1/31 Resp PCR: negative  Thank you for allowing pharmacy to be a part of this patient's care.  Junita Push PharmD 05/06/2023 11:42 PM

## 2023-05-06 NOTE — Consult Note (Addendum)
Cardiology Consultation   Patient ID: Dylan Owen MRN: [id number]; DOB: 12/22/1965  Admit date: 05/04/2023 Date of Consult: 05/06/2023  PCP:  Jeoffrey Massed, MD    HeartCare Providers Cardiologist:  None   {  Patient Profile:   Dylan Owen is a 58 y.o. male with a hx of hypertension, hyperlipidemia, type 2 diabetes, sleep apnea, chronic back pain, depression, GERD who is being seen 05/06/2023 for the evaluation of acute CHF at the request of Dr. Elvera Lennox.  History of Present Illness:   Mr. Dylan Owen has a past medical history of hypertension, hyperlipidemia, type 2 diabetes, sleep apnea, chronic back pain, depression, GERD. He was sent over to [facility] ED from primary care. He presented to his primary care with cough, sore throat, dizziness and body aches x 1 week. He reports the symptoms progressed to include chest tightness, headaches, chills, diaphoresis. He never took his temperature but symptoms reported suggest possible fever. He was hypertensive at his appointment.   When he presented to the ED, he also reported that he has been experiencing dyspnea especially with exertion recently. He uses a cane to ambulate at baseline but recently has been having to stop multiple times while walking through his house. Also reports LE edema as well as nighttime orthopnea. He has no known diagnosis of heart failure but did take Lasix for LE edema. He was previously seen by cardiology just in the setting of hyperlipidemia but has been lost to follow up. Per chart review, patient was significantly volume overloaded on exam.   Patient was given his home medications which improved his BP. BNP was 1,275, troponins 30 > 27, relatively normal CBC, hypokalemic on BMP, normal TSH. EKG showed sinus tachycardia with HR 107 bpm and some PVCs. He was given dose of IV Lasix, admitted to the hospital and echocardiogram was ordered.   Primary team started IV Lasix 60 mg BID x 4 doses,  strict I/Os, daily weights, restarted home BP meds including Lopressor 50 mg BID, lisinopril 40 mg daily. His BP improved from 170s/110s to 110/78. Patient underwent multiple episodes of hypoglycemia with glucose down to 45, they are managing his insulin dosing.   Cardiology was then consulted in the setting of acute heart failure.   After speaking with the patient, he agrees with the history as stated above. He admits that he has been noncompliant with his medications recently, mainly due to him being forgetful and not remembering to take them during the day. He does not weigh himself every day and typically takes his PO Lasix PRN for swelling. He notes that he has noticed some welling in his LE over the past week but had not taken any doses of his Lasix for it. Today, he states that he already feels significantly better and has been urinating frequently with the doses of IV Lasix. He denies any tobacco or alcohol use. Denies any known cardiac issues, other than his known hypertension, hyperlipidemia. He mentions an "irregular heart beat" that he was told he had in his youth. States he still feels some skipped beats. Telemetry shows frequent ectopy which could explain this. He is currently on 2 L oxygen via [place] but denies any use of supplemental oxygen at home.   Past Medical History:  Diagnosis Date   B12 deficiency    Blood transfusion without reported diagnosis    Chronic low back pain    Dr. Vear Clock is pain mgmt MD   Depression    Diabetes  mellitus with complication (HCC)    DPN + microalbuminuria   GERD (gastroesophageal reflux disease)    Heart murmur    History of adenomatous polyp of colon 05/22/2021   recall 7 yrs   Hypercholesterolemia    Statin intolerant.  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    Persistent asthma    Never smoker   Seizures (HCC)    Stable angina (HCC)    nl myoview 12/07  followed by Dr Eden Emms   Statin intolerance    myalgias   Subclinical hypothyroidism 06/2017   Past Surgical History:  Procedure Laterality Date   COLONOSCOPY  05/22/2021   [facility]--recall 7 years   CYSTOSCOPY WITH RETROGRADE URETHROGRAM N/A 08/21/2021   Procedure: RETROGRADE URETHROGRAM;  Surgeon: Crist Fat, MD;  Location: [facility];  Service: Urology;  Laterality: N/A;   CYSTOSCOPY WITH URETHRAL DILATATION N/A 08/21/2021   Procedure: CYSTOSCOPY WITH URETHRAL DILATATION OPTILUME;  Surgeon: Crist Fat, MD;  Location: [facility];  Service: Urology;  Laterality: N/A;   LUMBAR SPINE SURGERY     lumbar laminectomy with hardware stabilization, with ensuing arachnoiditis   URETHRAL STRICTURE DILATATION  1996 and 2004.   Laser surgery.    Home Medications:  Prior to Admission medications   Medication Sig Start Date End Date Taking? Authorizing Provider  Acetaminophen (TYLENOL PO) Take 500 mg by mouth daily as needed (pain).   Yes [provider]  albuterol (VENTOLIN HFA) 108 (90 Base) MCG/ACT inhaler Inhale 1-2 puffs into the lungs every 6 (six) hours as needed for wheezing or shortness of breath. 11/09/22  Yes McGowen, Maryjean Morn, MD  esomeprazole (NEXIUM) 40 MG capsule Take 1 capsule (40 mg total) by mouth daily. 11/05/22  Yes McGowen, Maryjean Morn, MD  Evolocumab (REPATHA SURECLICK) 140 MG/ML SOAJ INJECT 1 DOSE INTO THE SKIN EVERY 14 (FOURTEEN) DAYS. 09/21/22  Yes Hilty, Lisette Abu, MD  furosemide (LASIX) 20 MG tablet TAKE 1 TABLET BY MOUTH EVERY MORNING AS NEEDED FOR SWELLING 04/16/22  Yes McGowen, Maryjean Morn, MD  hydrochlorothiazide (HYDRODIURIL) 25 MG tablet Take 1 tablet (25 mg total) by mouth daily. 11/05/22 11/05/23 Yes McGowen, Maryjean Morn, MD  insulin aspart (NOVOLOG FLEXPEN) 100 UNIT/ML FlexPen 3 units SQ before breakfast, lunch, and dinner Patient taking differently: Inject 3 Units into  the skin 3 (three) times daily as needed for high blood sugar. 3 units SQ before breakfast, lunch, and dinner 11/26/22  Yes McGowen, Maryjean Morn, MD  insulin degludec (TRESIBA FLEXTOUCH) 100 UNIT/ML FlexTouch Pen 80 U qAM and 72 U qPM 12/09/22  Yes McGowen, Maryjean Morn, MD  lisinopril (ZESTRIL) 40 MG tablet Take 1 tablet (40 mg total) by mouth daily. 11/05/22  Yes McGowen, Maryjean Morn, MD  methocarbamol (ROBAXIN) 500 MG tablet Take 500 mg by mouth 2 (two) times daily as needed for muscle spasms. 10/05/19  Yes [provider]  metoprolol tartrate (LOPRESSOR) 50 MG tablet TAKE 1 TABLET BY MOUTH TWICE A DAY. OFFICE VISIT NEEDED FOR FURTHER REFILLS 08/05/22  Yes McGowen, Maryjean Morn, MD  morphine (MS CONTIN) 30 MG 12 hr tablet Take 30-60 mg by mouth See admin instructions. Taking 60 mg in the AM and 30 mg in the afternoon and 60 mg at bedtime 10/05/19  Yes [provider]  Multiple Vitamin (MULTIVITAMIN) tablet  Take 1 tablet by mouth daily.   Yes [provider]  nitroGLYCERIN (NITROSTAT) 0.4 MG SL tablet PLACE 1 TABLET UNDER THE TONGUE EVERY 5 MINUTES AS NEEDED. 08/07/21  Yes McGowen, Maryjean Morn, MD  venlafaxine XR (EFFEXOR-XR) 150 MG 24 hr capsule Take 150 mg by mouth 2 (two) times daily. 09/26/15  Yes [provider]  B-D ULTRAFINE III SHORT PEN 31G X 8 MM MISC USE AS DIRECTED TWICE DAILY FOR MEDICATION INJECTION 09/21/21   McGowen, Maryjean Morn, MD  blood glucose meter kit and supplies KIT Dispense based on patient and insurance preference. Use to check glucose 4 times daily. 10/22/19   McGowen, Maryjean Morn, MD  Continuous Glucose Receiver (FREESTYLE LIBRE 3 READER) DEVI Use to check glucose continuously 11/09/22   McGowen, Maryjean Morn, MD  Continuous Glucose Sensor (FREESTYLE LIBRE 3 SENSOR) MISC Place 1 sensor on the skin every 14 days. Use to check glucose continuously 11/09/22   McGowen, Maryjean Morn, MD  FREESTYLE TEST STRIPS test strip USE TO CHECK GLUCOSE 4 TIMES DAILY. 05/05/21   McGowen, Maryjean Morn, MD    Inpatient Medications: Scheduled Meds:  enoxaparin (LOVENOX) injection  70 mg Subcutaneous Q24H   [START ON 05/07/2023] furosemide  60 mg Intravenous BID   insulin aspart  0-9 Units Subcutaneous TID WC   insulin glargine-yfgn  20 Units Subcutaneous Daily   metoprolol tartrate  50 mg Oral BID   morphine  60 mg Oral BID   And   morphine  30 mg Oral Q24H   pantoprazole  40 mg Oral Daily   potassium chloride  40 mEq Oral Daily   venlafaxine XR  150 mg Oral BID   Continuous Infusions:  PRN Meds: acetaminophen **OR** acetaminophen, albuterol, methocarbamol, nitroGLYCERIN  Allergies:    Allergies  Allergen Reactions   Ace Inhibitors     Kidney failure   Atorvastatin Other (See Comments)    Arm pain   Prednisone     REACTION: Anxiety   Septra [Sulfamethoxazole-Trimethoprim]    Metformin And Related Diarrhea   Social History:   Social History   Socioeconomic History   Marital status: Married    Spouse name: Not on file   Number of children: Not on file   Years of education: Not on file   Highest education level: GED or equivalent  Occupational History   Not on file  Tobacco Use   Smoking status: Never   Smokeless tobacco: Current    Types: Chew   Tobacco comments:    dip  Vaping Use   Vaping status: Never Used  Substance and Sexual Activity   Alcohol use: No   Drug use: No   Sexual activity: Yes  Other Topics Concern   Not on file  Social History Narrative   Married, 4 children.   Occupation: disabled since 2000.     Prior to disability he worked for [employer].   Orig from [place].   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  No hx of drug abuse.   Chews tobacco since age 46 yrs.   Regular exercise:  No               Social Drivers of Corporate investment banker Strain: High Risk (12/05/2022)   Overall Financial Resource Strain (CARDIA)    Difficulty of Paying Living Expenses: Very  hard  Food Insecurity: No Food Insecurity (05/05/2023)   Hunger Vital  Sign    Worried About Programme researcher, broadcasting/film/video in the Last Year: Never true    Ran Out of Food in the Last Year: Never true  Recent Concern: Food Insecurity - Food Insecurity Present (05/05/2023)   Hunger Vital Sign    Worried About Running Out of Food in the Last Year: Never true    Ran Out of Food in the Last Year: Sometimes true  Transportation Needs: No Transportation Needs (05/05/2023)   PRAPARE - Administrator, Civil Service (Medical): No    Lack of Transportation (Non-Medical): No  Physical Activity: Unknown (12/05/2022)   Exercise Vital Sign    Days of Exercise per Week: 0 days    Minutes of Exercise per Session: Not on file  Stress: Stress Concern Present (12/05/2022)   [employer] of Occupational Health - Occupational Stress Questionnaire    Feeling of Stress : To some extent  Social Connections: Moderately Isolated (05/05/2023)   Social Connection and Isolation Panel [NHANES]    Frequency of Communication with Friends and Family: Three times a week    Frequency of Social Gatherings with Friends and Family: Never    Attends Religious Services: Never    Database administrator or Organizations: No    Attends Banker Meetings: Never    Marital Status: Married  Catering manager Violence: Not At Risk (05/05/2023)   Humiliation, Afraid, Rape, and Kick questionnaire    Fear of Current or Ex-Partner: No    Emotionally Abused: No    Physically Abused: No    Sexually Abused: No    Family History:   Family History  Problem Relation Age of Onset   Cancer Paternal Grandfather        prostate   Heart disease Other    Hypertension Other    Other Other        Emotional Illness   Colon cancer Neg Hx    Esophageal cancer Neg Hx    Rectal cancer Neg Hx    Stomach cancer Neg Hx     ROS:  Please see the history of present illness.  All other ROS reviewed and negative.     Physical  Exam/Data:   Vitals:   05/06/23 0046 05/06/23 0500 05/06/23 0606 05/06/23 0906  BP: 120/70  110/78 115/72  Pulse: (!) 55  71 70  Resp: 19  20   Temp: 97.8 F (36.6 C)  97.8 F (36.6 C)   TempSrc: Oral  Oral   SpO2: 99%  98%   Weight:  (!) 146.1 kg    Height:        Intake/Output Summary (Last 24 hours) at 05/06/2023 1214 Last data filed at 05/06/2023 0158 Gross per 24 hour  Intake 480 ml  Output --  Net 480 ml      05/06/2023    5:00 AM 05/05/2023    6:50 AM 05/04/2023    2:35 PM  Last 3 Weights  Weight (lbs) 322 lb 1.6 oz 328 lb 328 lb  Weight (kg) 146.104 kg 148.78 kg 148.78 kg     Body mass index is 42.5 kg/m.  General:  Well nourished, well developed, in no acute distress, on 2 L oxygen via [place] HEENT: normal Neck: no JVD Vascular:  Distal pulses 2+ bilaterally Cardiac:  normal S1, S2; RRR; no murmur  Lungs:  clear to auscultation bilaterally, difficult for him to take a deep breath Abd: soft, nontender Ext: trace non pitting bilateral LE edema, patient  states he has baseline numbness from his knees down due to diabetic neuropathy  Musculoskeletal:  No deformities Skin: warm and dry  Neuro:  no focal abnormalities noted Psych:  Normal affect   Telemetry:  Telemetry was personally reviewed and demonstrates:  sinus rhythm, HR 70s, frequent ectopy   Relevant CV Studies: Echocardiogram (03/04/2024) IMPRESSIONS   1. Cannot accurately estimate EF despite addition fo Definity contrast  due to forshortened views and shadowing. The left ventricle demonstrates  regional wall motion abnormalities (see scoring diagram/findings for  description). There is hypokinesis of  the apex and mid to distal anterior wall and anteroseptum.There is mild  asymmetric left ventricular hypertrophy. Left ventricular diastolic  parameters are indeterminate. Elevated left ventricular end-diastolic  pressure.   2. Right ventricular systolic function is normal. The right ventricular  size is  normal. There is mildly elevated pulmonary artery systolic  pressure. The estimated right ventricular systolic pressure is 39.4 mmHg.   3. The mitral valve is normal in structure. Mild mitral valve  regurgitation. No evidence of mitral stenosis.   4. The aortic valve is normal in structure. Aortic valve regurgitation is  not visualized. No aortic stenosis is present.   5. The inferior vena cava is dilated in size with <50% respiratory  variability, suggesting right atrial pressure of 15 mmHg.   6. Consider repeat limited echo vs cardiac MRI for further assessment of  EF   FINDINGS   Left Ventricle: Cannot accurately estimate EF despite definity contrast  due to poor image quality, forshortened views and shadowing. The left  ventricle demonstrates regional wall motion abnormalities. Definity  contrast agent was given IV to delineate  the left ventricular endocardial borders. The left ventricular internal  cavity size was normal in size. There is mild asymmetric left ventricular  hypertrophy. Left ventricular diastolic parameters are indeterminate.  Elevated left ventricular  end-diastolic pressure.   Right Ventricle: The right ventricular size is normal. No increase in  right ventricular wall thickness. Right ventricular systolic function is  normal. There is mildly elevated pulmonary artery systolic pressure. The  tricuspid regurgitant velocity is 2.47   m/s, and with an assumed right atrial pressure of 15 mmHg, the estimated  right ventricular systolic pressure is 39.4 mmHg.   Left Atrium: Left atrial size was normal in size.   Right Atrium: Right atrial size was normal in size.   Pericardium: There is no evidence of pericardial effusion.   Mitral Valve: The mitral valve is normal in structure. Mild mitral valve  regurgitation. No evidence of mitral valve stenosis.   Tricuspid Valve: The tricuspid valve is normal in structure. Tricuspid  valve regurgitation is mild . No  evidence of tricuspid stenosis.   Aortic Valve: The aortic valve is normal in structure. Aortic valve  regurgitation is not visualized. No aortic stenosis is present.   Pulmonic Valve: The pulmonic valve was normal in structure. Pulmonic valve  regurgitation is mild. No evidence of pulmonic stenosis.   Aorta: The aortic root is normal in size and structure.   Venous: The inferior vena cava is dilated in size with less than 50%  respiratory variability, suggesting right atrial pressure of 15 mmHg.   IAS/Shunts: No atrial level shunt detected by color flow Doppler.   Laboratory Data:  High Sensitivity Troponin:   Recent Labs  Lab 05/05/23 0230 05/05/23 0346  TROPONINIHS 30* 27*     Chemistry Recent Labs  Lab 05/04/23 1645 05/05/23 0633 05/06/23 0504  NA 140 138 137  K 3.2* 3.2* 4.0  CL 103 99 96*  CO2 24 28 27   GLUCOSE 90 104* 86  BUN 16 18 21*  CREATININE 1.11 1.17 1.57*  CALCIUM 8.9 8.7* 8.9  MG  --  1.7  --   GFRNONAA >60 >60 51*  ANIONGAP 13 11 14     Recent Labs  Lab 05/04/23 1645 05/05/23 0633  PROT 7.4 7.0  ALBUMIN 3.5 3.2*  AST 21 22  ALT 12 11  ALKPHOS 70 67  BILITOT 2.0* 1.9*   Lipids No results for input(s): "CHOL", "TRIG", "HDL", "LABVLDL", "LDLCALC", "CHOLHDL" in the last 168 hours.  Hematology Recent Labs  Lab 05/04/23 1645 05/05/23 0346 05/06/23 0504  WBC 9.3 9.4 9.9  RBC 5.72 5.59 5.99*  HGB 15.0 14.8 16.1  HCT 49.7 48.9 53.3*  MCV 86.9 87.5 89.0  MCH 26.2 26.5 26.9  MCHC 30.2 30.3 30.2  RDW 15.8* 15.8* 16.9*  PLT 248 262 272   Thyroid  Recent Labs  Lab 05/05/23 0619  TSH 1.853    BNP Recent Labs  Lab 05/04/23 1645  BNP 1,275.2*    DDimer No results for input(s): "DDIMER" in the last 168 hours.  Radiology/Studies:  ECHOCARDIOGRAM COMPLETE Result Date: 05/05/2023    ECHOCARDIOGRAM REPORT   Patient Name:   TAEVON ASCHOFF [facility] Date of Exam: 05/05/2023 Medical Rec #:  [id number]         Height:       73.0 in Accession #:     [id number]        Weight:       328.0 lb Date of Birth:  08/17/1965         BSA:          2.655 m Patient Age:    58 years          BP:           171/97 mmHg Patient Gender: M                 HR:           85 bpm. Exam Location:  Inpatient Procedure: 2D Echo, Color Doppler, Cardiac Doppler and Intracardiac            Opacification Agent Indications:    Congestive Heart Failure I50.9  History:        Patient has no prior history of Echocardiogram examinations.                 CHF; Risk Factors:Hypertension, Diabetes and HLD.  Sonographer:    Webb Laws Referring Phys: [id number] ARSHAD N KAKRAKANDY IMPRESSIONS  1. Cannot accurately estimate EF despite addition fo Definity contrast due to forshortened views and shadowing. The left ventricle demonstrates regional wall motion abnormalities (see scoring diagram/findings for description). There is hypokinesis of the apex and mid to distal anterior wall and anteroseptum.There is mild asymmetric left ventricular hypertrophy. Left ventricular diastolic parameters are indeterminate. Elevated left ventricular end-diastolic pressure.  2. Right ventricular systolic function is normal. The right ventricular size is normal. There is mildly elevated pulmonary artery systolic pressure. The estimated right ventricular systolic pressure is 39.4 mmHg.  3. The mitral valve is normal in structure. Mild mitral valve regurgitation. No evidence of mitral stenosis.  4. The aortic valve is normal in structure. Aortic valve regurgitation is not visualized. No aortic stenosis is present.  5. The inferior vena cava is dilated in size with <50% respiratory variability, suggesting right atrial pressure of 15 mmHg.  6.  Consider repeat limited echo vs cardiac MRI for further assessment of EF FINDINGS  Left Ventricle: Cannot accurately estimate EF despite definity contrast due to poor image quality, forshortened views and shadowing. The left ventricle demonstrates regional wall motion abnormalities.  Definity contrast agent was given IV to delineate the left ventricular endocardial borders. The left ventricular internal cavity size was normal in size. There is mild asymmetric left ventricular hypertrophy. Left ventricular diastolic parameters are indeterminate. Elevated left ventricular end-diastolic pressure. Right Ventricle: The right ventricular size is normal. No increase in right ventricular wall thickness. Right ventricular systolic function is normal. There is mildly elevated pulmonary artery systolic pressure. The tricuspid regurgitant velocity is 2.47  m/s, and with an assumed right atrial pressure of 15 mmHg, the estimated right ventricular systolic pressure is 39.4 mmHg. Left Atrium: Left atrial size was normal in size. Right Atrium: Right atrial size was normal in size. Pericardium: There is no evidence of pericardial effusion. Mitral Valve: The mitral valve is normal in structure. Mild mitral valve regurgitation. No evidence of mitral valve stenosis. Tricuspid Valve: The tricuspid valve is normal in structure. Tricuspid valve regurgitation is mild . No evidence of tricuspid stenosis. Aortic Valve: The aortic valve is normal in structure. Aortic valve regurgitation is not visualized. No aortic stenosis is present. Pulmonic Valve: The pulmonic valve was normal in structure. Pulmonic valve regurgitation is mild. No evidence of pulmonic stenosis. Aorta: The aortic root is normal in size and structure. Venous: The inferior vena cava is dilated in size with less than 50% respiratory variability, suggesting right atrial pressure of 15 mmHg. IAS/Shunts: No atrial level shunt detected by color flow Doppler.  LEFT VENTRICLE PLAX 2D LVIDd:         5.60 cm      Diastology LVIDs:         4.30 cm      LV e' medial:    4.24 cm/s LV PW:         1.20 cm      LV E/e' medial:  21.5 LV IVS:        1.20 cm      LV e' lateral:   6.31 cm/s LVOT diam:     2.00 cm      LV E/e' lateral: 14.5 LV SV:         27 LV SV Index:    10 LVOT Area:     3.14 cm  LV Volumes (MOD) LV vol d, MOD A2C: 162.0 ml LV vol d, MOD A4C: 183.0 ml LV vol s, MOD A2C: 127.0 ml LV vol s, MOD A4C: 114.0 ml LV SV MOD A2C:     35.0 ml LV SV MOD A4C:     183.0 ml LV SV MOD BP:      56.9 ml RIGHT VENTRICLE            IVC RV S prime:     7.07 cm/s  IVC diam: 3.30 cm TAPSE (M-mode): 2.2 cm LEFT ATRIUM             Index        RIGHT ATRIUM           Index LA diam:        4.40 cm 1.66 cm/m   RA Area:     19.40 cm LA Vol (A2C):   38.0 ml 14.31 ml/m  RA Volume:   55.20 ml  20.79 ml/m LA Vol (A4C):   41.7 ml 15.71 ml/m LA  Biplane Vol: 40.9 ml 15.40 ml/m  AORTIC VALVE             PULMONIC VALVE LVOT Vmax:   57.50 cm/s  PR End Diast Vel: 1.70 msec LVOT Vmean:  40.900 cm/s LVOT VTI:    0.086 m  AORTA Ao Root diam: 3.40 cm Ao Asc diam:  3.30 cm MITRAL VALVE               TRICUSPID VALVE MV Area (PHT): 9.37 cm    TR Peak grad:   24.4 mmHg MV Decel Time: 81 msec     TR Vmax:        247.00 cm/s MV E velocity: 91.30 cm/s MV A velocity: 59.10 cm/s  SHUNTS MV E/A ratio:  1.54        Systemic VTI:  0.09 m                            Systemic Diam: 2.00 cm Armanda Magic MD Electronically signed by Armanda Magic MD Signature Date/Time: 05/05/2023/1:20:27 PM    Final    VAS [place] LOWER EXTREMITY VENOUS (DVT) Result Date: 05/05/2023  Lower Venous DVT Study Patient Name:  NIKOLAOS MADDOCKS [facility]  Date of Exam:   05/05/2023 Medical Rec #: [id number]          Accession #:    [id number] Date of Birth: 08-30-1965          Patient Gender: M Patient Age:   47 years Exam Location:  [facility] Procedure:      VAS [place] LOWER EXTREMITY VENOUS (DVT) Referring Phys: Midge Minium --------------------------------------------------------------------------------  Indications: Edema.  Risk Factors: Obesity. Limitations: Body habitus and bandages. Comparison Study: None. Performing Technologist: Shona Simpson  Examination Guidelines: A complete evaluation includes B-mode imaging, spectral Doppler,  color Doppler, and power Doppler as needed of all accessible portions of each vessel. Bilateral testing is considered an integral part of a complete examination. Limited examinations for reoccurring indications may be performed as noted. The reflux portion of the exam is performed with the patient in reverse Trendelenburg.  +---------+---------------+---------+-----------+----------+-------------------+ RIGHT    CompressibilityPhasicitySpontaneityPropertiesThrombus Aging      +---------+---------------+---------+-----------+----------+-------------------+ CFV      Full           Yes      Yes                                      +---------+---------------+---------+-----------+----------+-------------------+ SFJ      Full                                                             +---------+---------------+---------+-----------+----------+-------------------+ FV Prox  Full                                                             +---------+---------------+---------+-----------+----------+-------------------+ FV Mid   Full                                                             +---------+---------------+---------+-----------+----------+-------------------+  FV DistalFull                                         Not well visualized +---------+---------------+---------+-----------+----------+-------------------+ PFV      Full                                                             +---------+---------------+---------+-----------+----------+-------------------+ POP      Full           Yes      Yes                                      +---------+---------------+---------+-----------+----------+-------------------+ PTV      Full                                                             +---------+---------------+---------+-----------+----------+-------------------+ PERO     Full                                         Not well visualized  +---------+---------------+---------+-----------+----------+-------------------+   +---------+---------------+---------+-----------+----------+-------------------+ LEFT     CompressibilityPhasicitySpontaneityPropertiesThrombus Aging      +---------+---------------+---------+-----------+----------+-------------------+ CFV      Full           Yes      Yes                                      +---------+---------------+---------+-----------+----------+-------------------+ SFJ      Full                                                             +---------+---------------+---------+-----------+----------+-------------------+ FV Prox  Full                                                             +---------+---------------+---------+-----------+----------+-------------------+ FV Mid   Full                                                             +---------+---------------+---------+-----------+----------+-------------------+ FV DistalFull  Not well visualized +---------+---------------+---------+-----------+----------+-------------------+ PFV      Full                                                             +---------+---------------+---------+-----------+----------+-------------------+ POP      Full           Yes      Yes                                      +---------+---------------+---------+-----------+----------+-------------------+ PTV      Full                                                             +---------+---------------+---------+-----------+----------+-------------------+ PERO                             Yes                  Not well visualized +---------+---------------+---------+-----------+----------+-------------------+     Summary: BILATERAL: - No evidence of deep vein thrombosis seen in the lower extremities, bilaterally. -No evidence of popliteal cyst, bilaterally.   *See  table(s) above for measurements and observations. Electronically signed by Sherald Hess MD on 05/05/2023 at 11:32:53 AM.    Final    DG Chest 2 View Result Date: 05/04/2023 CLINICAL DATA:  Short of breath EXAM: CHEST - 2 VIEW COMPARISON:  None Available. FINDINGS: Normal cardiac silhouette. Central venous congestion. Small pleural effusions. No overt pulmonary edema. No focal consolidation. No pneumothorax. No acute osseous abnormality. IMPRESSION: Central venous congestion and small pleural effusions. Electronically Signed   By: Genevive Bi M.D.   On: 05/04/2023 18:00   Assessment and Plan:   Acute CHF, NYHA Class III Elevated troponins Possible viral myocarditis Echo from 05/05/23: EF not documented on report, but on my personal review of echo appears to have EF ~25%, LV RWMA, hypokinesis of apex and mid to distal anterior wall and anteroseptum, mild asymmetric LVH, elevated LVEDP, normal RV function, mild MR, dilated IVC Patient has history of URI-like symptoms for the past week, no known sick contacts   BNP 1,275 CXR showed central venous congestion and small pleural effusions Patient appeared volume overloaded on exam  Weight 322 lb down from 328 lb on admission  Net -1.6 L this admission, states he is urinating very frequently, I/Os might not be accurately recorded Respiratory panel pending  Troponin mildly elevated and flat, 30 > 27, no current chest pain, likely secondary to CHF  Continue Lopressor 50 mg BID Held PM dose IV Lasix today due to AKI, will restart tomorrow AM Stopped lisinopril in anticipation to start ARNI  Will titrate GDMT when patient is euvolemic, has stable renal function and BP With degree of acute heart failure patient would benefit from ischemic evaluation as well as RHC  AKI Baseline creatinine 0.8-1.1 Creatinine today 1.57  Continue to monitor labs  Held PM dose IV Lasix, will restart tomorrow pending renal function  Held lisinopril,  for renal  function and with hopes to start ARNI   Hypertension Most recent BP 115/72, well controlled  PTA medications include, hydrochlorothiazide, Lopressor, lisinopril  Continue Lopressor 50 mg BID Stopped lisinopril with anticipation to start ARNI in near future   Hyperlipidemia Was previously seen in lipid clinic, lost to follow up and has not been seen since 02/2022 Ordered updated lipid panel in AM Patient on Repatha   Per primary Diabetes Chronic low back pain OSA Depression GERD  Risk Assessment/Risk Scores:      New York Heart Association (NYHA) Functional Class NYHA Class III  For questions or updates, please contact [facility] Please consult [email or web address] for contact info under    Signed, Olena Leatherwood, PA-C  05/06/2023 12:14 PM As above, patient seen and examined.  Briefly he is a 58 year old male with longstanding diabetes mellitus, hypertension, hyperlipidemia, sleep apnea, chronic back pain for evaluation of acute combined systolic/diastolic congestive heart failure.  Patient has chronic dyspnea on exertion.  However over the past week he complained of increased dyspnea on exertion, bilateral lower extremity edema.  He denies chest pain.  He denies fevers or chills but he is have a nonproductive cough.  No hemoptysis.  Note he does take Lasix as needed at home for edema.  Cardiology now asked to evaluate.  Chest x-ray shows central venous congestion and small bilateral pleural effusions.  Sodium 137, potassium 4.0, creatinine 1.57.  Electrocardiogram shows sinus tachycardia, PAC, PVC, cannot rule out septal and lateral infarct.  Echocardiogram personally reviewed and is technically difficult.  There appears to be hypokinesis of the anteroseptal wall and apex with probable mild to moderate LV dysfunction (ejection fraction 40 to 45%), mild mitral regurgitation.  Venous Doppler showed no DVT.  Hemoglobin 15, BNP 1275, troponin I 30 and 27.  Hemoglobin A1c 10.2.  1  acute combined systolic/diastolic congestive heart failure-I have personally reviewed the patient's echocardiogram as outlined above.  LV function is difficult to quantitate.  Ejection fraction appears to be 40 to 45% with hypokinesis of the anteroseptal wall and apex.  Ultimately plan cardiac MRI to more fully assess.  Continue Lasix and add Jardiance 10 mg daily.  Add spironolactone 12.5 mg daily.  Follow potassium and renal function closely.  Patient will likely require right and left cardiac catheterization early next week.  The risk and benefits including myocardial infarction, CVA and death discussed and he agrees to proceed.  2 cardiomyopathy-possibly ischemic in etiology.  Plan right and left cardiac catheterization as outlined.  Discontinue metoprolol and treat with Toprol 25 mg daily.  Will add ARB or Entresto later as blood pressure and renal function allow.  3 acute kidney injury-creatinine mildly increased today.  Continue to follow closely with diuresis and addition of above medications.  4 hyperlipidemia-patient is intolerant to statins.  Resume Repatha at discharge.  5 minimally elevated troponin-not consistent with acute coronary syndrome.  6 hypertension-follow blood pressure with above medications and adjust regimen as needed.  7 diabetes mellitus-Per primary service.  Olga Millers, MD

## 2023-05-06 NOTE — Progress Notes (Incomplete)
    Patient Name: Dylan Owen           DOB: Feb 16, 1966  MRN: [id number]      Admission Date: 05/04/2023  Attending Provider: Leatha Gilding, MD  Primary Diagnosis: Acute CHF (congestive heart failure) [facility])   Level of care: Stepdown    CROSS COVER NOTE   Date of Service   05/06/2023   Dylan Owen, 58 y.o. male, was admitted on 05/04/2023 for Acute CHF (congestive heart failure) (HCC).    HPI/Events of Note   Rapid Response: "unresponsive, diaphoretic" Patient had similar episodes during daytime with multiple episodes of hypoglycemia in the past 24 hours.  Has history of diabetes and glargine dose of 20 units was given in the morning.   Head CT ordered by MD due to AMS.  Plan is to transfer to SDU.   Current CBG 84; all other vitals stable, unable to obtain accurate pulse ox due to cold/ clammy extremity. Currently reading 85% SpO2 on 3 L [place]. Hx of obstructive sleep apnea, noncompliant on CPAP.    Plan:  Give 1 amp D50 EKG, troponin- NSR with PVC, nonspecific ST abnormalities, prolonged QT VBG Head CT on the way down to SDU Hold sedative agents.  Bedside Assessment:  Patient is awake.  Nonverbal, however making grunting noises. He is pale and clammy.   Unable to follow commands at this time, but able to withdraw extremities when painful stimuli is applied.   Respiratory: Bilaterally clear, diminished bases.  No wheezing, no crackles. Normal effort. No accessory muscle use.  Cardiovascular: Regular rate and rhythm.  BLE edema +2/+3 Abdomen: Abdomen is soft and nontender.  Obese.  Positive bowel sounds in all quadrants.    Addendum: After receiving amp of D50, CBG 152. He is now verbal and more interactive, but confused.  Has some delayed response.  Will check CBG hourly.  Head CT pending.    2150-  Acute hypoxic hypercarbic respiratory failure Obstructive sleep apnea, noncompliant with CPAP Blood gas showing respiratory acidosis; pH 7.25, pCO2 73, pO2<  31. Placing on BiPAP.   Repeat blood gas in the am.   Patient more responsive, but still confused.  Alert to self only. Following simple commands.    Febrile, temp 102. New onset. Tylenol given.   Sepsis workup started.  Blood culture, chest x-ray, UA, CBC, lactic acid, procalcitonin, COVID PCR.  Respiratory panel negative.   His wife Dylan Owen) has been updated regarding transfer to ICU and events. She is unable to come to bedside tonight as she is caring for her elder mother. Code status confirmed, full code.     2350- Head CT- no acute intracranial abnormalities. Chest x-ray-mild cardiomegaly.  No focal airspace consolidations or pulmonary edema.   Sepsis, unknown source Lactic acid 3.7.  Febrile with max temp of 102.6.  Leukocytosis 12.8. BP and HR normal.  Plan:   broad-spectrum antibiotics cefepime and Flagyl.  Repeat lactic acid after fluid bolus.  Hypoglycemic Diabetes mellitus type 2 Continue CBGs hourly.  Start D10 gtt.    0600-  Transaminitis  CT abd/ pelvic w/o contrast, creatine levels bumping up.     Interventions/ Plan   X        Anthoney Harada, DNP, ACNPC- AG [facility] [place]

## 2023-05-06 NOTE — Progress Notes (Signed)
   05/06/23 1310  TOC Brief Assessment  Patient has primary care physician Yes  Prior level of function: Independent  Prior/Current Home Services No current home services  Social Drivers of Health Review SDOH reviewed no interventions necessary  Readmission risk has been reviewed Yes  Transition of care needs [facility] screen consult placed; TOC will follow to assist with potential DC needs.

## 2023-05-06 NOTE — Significant Event (Signed)
Rapid Response Event Note   Reason for Call :  Patient unresponsive  2nd call: Patient unresponsive, grunting, diaphoretic  Initial Focused Assessment:  Patient alert whenever entering the room. Garment/textile technologist at bedside upon arrival. Patient doesn't seem to be in any respiratory distress. Patient is pale, cold, and diaphoretic.  Primary RN increased O2 to 5L because oxygen was in the mid 80's on arrival. MD at bedside. CBG checked and blood sugar is 98, even though patient is symptomatic. Patient able to answer orientation questions and intermittently follow commands, patient responding very slowly. Bedside RN said this is back to the patients baseline and patient is much better than the previous.  2nd call: Rapid response called again for the same situation with patient. Patient was lethargic and grunting upon arrival. CBG obtained was 84, patient symptomatic. NP at bedside. Patient given 1 amp of D50. Patient becoming more alert and able to answer questions. Unable to get good O2 sat reading, pulse ox changed, O2 sats in mid 80's, patient placed on NRB. See flowsheets for vital signs.  Interventions:  CBG, MD ordered head CT and D5 1/2 NS  2nd call: 1 amp D50, EKG, Head CT, Labs ordered  Plan of Care:  Closely monitor CBG, MD wants to keep CBG elevated.   2nd call: Patient will be moved to Step down unit for further monitoring.  Event Summary:   MD Notified: Yes Call Time: 62, 1913 Arrival Time: 1750, 1917 End Time: 1805, 2001  Rockne Menghini, RN

## 2023-05-06 NOTE — Progress Notes (Signed)
PROGRESS NOTE  Dylan Owen RUE:[id number] DOB: [birth year] DOA: 05/04/2023 PCP: Jeoffrey Massed, MD   LOS: 1 day   Brief Narrative / Interim history: 58 year old male with history of obesity, HTN, HLD, DM2, OSA comes into the hospital with increasing shortness of breath.  This is been going on for the past week, patient has been having increased cough, congestion as well as getting chest pains which are worse with coughing.  He also noted fluid building up and a 10 pound weight gain.  He does have furosemide at home that he is supposedly taking as needed, has been trying few doses without help.  Was seen by his PCP and sent to the ER for evaluation  Subjective / 24h Interval events: States that he is feeling better after receiving furosemide yesterday, feels like swelling is coming down.  Shortness of breath is better  Assesement and Plan: Principal Problem:   Acute CHF (congestive heart failure) (HCC) Active Problems:   SLEEP APNEA, OBSTRUCTIVE   PERIPHERAL NEUROPATHY   Essential hypertension   GERD   LOW BACK PAIN   Hyperlipidemia   Diabetes mellitus with complication (HCC)   Depression  Principal problem Concern for acute CHF, suspect systolic-patient admitted to the hospital with peripheral edema, elevated BNP and a chest x-ray showing vascular congestion.  Has received few doses of furosemide, he is net -1.6 L and his weight is down to 322 pounds from 328 on admission. -A 2D echo done yesterday could not evaluate EF but did show regional wall motion abnormalities of the apex and mid to distal anterior wall and anteroseptum.  Due to this, cardiology consulted today, appreciate input  Active problems Essential hypertension-continue metoprolol, furosemide.  Blood pressure controlled this morning  Acute hypoxic respiratory failure-due to #1, wean off to room air as tolerated and as being diuresed.  Chronic low back pain-continue home medications  Depression-continue  Effexor  Sleep apnea-noncompliant with CPAP  Elevated troponin-flat, not in a pattern consistent with ACS  Hyperlipidemia-on Repatha  DM2, poorly controlled-had a hypoglycemic episode last night and his glargine was decreased from 30 to 20 units.  Continue this along with sliding scale.  Monitor  Lab Results  Component Value Date   HGBA1C 10.2 (H) 05/05/2023   CBG (last 3)  Recent Labs    05/06/23 0340 05/06/23 0603 05/06/23 0741  GLUCAP 154* 79 121*    Scheduled Meds:  enoxaparin (LOVENOX) injection  70 mg Subcutaneous Q24H   furosemide  60 mg Intravenous Q12H   insulin aspart  0-9 Units Subcutaneous TID WC   insulin glargine-yfgn  20 Units Subcutaneous Daily   metoprolol tartrate  50 mg Oral BID   morphine  60 mg Oral BID   And   morphine  30 mg Oral Q24H   pantoprazole  40 mg Oral Daily   potassium chloride  40 mEq Oral Daily   venlafaxine XR  150 mg Oral BID   Continuous Infusions: PRN Meds:.acetaminophen **OR** acetaminophen, albuterol, methocarbamol, nitroGLYCERIN  Current Outpatient Medications  Medication Instructions   Acetaminophen (TYLENOL PO) 500 mg, Oral, Daily PRN   albuterol (VENTOLIN HFA) 108 (90 Base) MCG/ACT inhaler 1-2 puffs, Inhalation, Every 6 hours PRN   B-D ULTRAFINE III SHORT PEN 31G X 8 MM MISC USE AS DIRECTED TWICE DAILY FOR MEDICATION INJECTION   blood glucose meter kit and supplies KIT Dispense based on patient and insurance preference. Use to check glucose 4 times daily.   Continuous Glucose Receiver (FREESTYLE LIBRE 3 READER)  DEVI Use to check glucose continuously   Continuous Glucose Sensor (FREESTYLE LIBRE 3 SENSOR) MISC Place 1 sensor on the skin every 14 days. Use to check glucose continuously   esomeprazole (NEXIUM) 40 mg, Oral, Daily   Evolocumab (REPATHA SURECLICK) 140 MG/ML SOAJ Subcutaneous, Every 14 days   FREESTYLE TEST STRIPS test strip USE TO CHECK GLUCOSE 4 TIMES DAILY.   furosemide (LASIX) 20 MG tablet TAKE 1 TABLET BY  MOUTH EVERY MORNING AS NEEDED FOR SWELLING   hydrochlorothiazide (HYDRODIURIL) 25 mg, Oral, Daily   insulin aspart (NOVOLOG FLEXPEN) 100 UNIT/ML FlexPen 3 units SQ before breakfast, lunch, and dinner   insulin degludec (TRESIBA FLEXTOUCH) 100 UNIT/ML FlexTouch Pen 80 U qAM and 72 U qPM   lisinopril (ZESTRIL) 40 mg, Oral, Daily   methocarbamol (ROBAXIN) 500 mg, Oral, 2 times daily PRN   metoprolol tartrate (LOPRESSOR) 50 MG tablet TAKE 1 TABLET BY MOUTH TWICE A DAY. OFFICE VISIT NEEDED FOR FURTHER REFILLS   morphine (MS CONTIN) 30-60 mg, Oral, See admin instructions, Taking 60 mg in the AM and 30 mg in the afternoon and 60 mg at bedtime   Multiple Vitamin (MULTIVITAMIN) tablet 1 tablet, Oral, Daily,     nitroGLYCERIN (NITROSTAT) 0.4 MG SL tablet PLACE 1 TABLET UNDER THE TONGUE EVERY 5 MINUTES AS NEEDED.   venlafaxine XR (EFFEXOR-XR) 150 mg, 2 times daily    Diet Orders (From admission, onward)     Start     Ordered   05/05/23 1651  Diet Heart Room service appropriate? Yes; Fluid consistency: Thin  Diet effective now       Question Answer Comment  Room service appropriate? Yes   Fluid consistency: Thin      05/05/23 1650            DVT prophylaxis:    Lab Results  Component Value Date   PLT 272 05/06/2023      Code Status: Full Code  Family Communication: no family at bedside   Status is: Inpatient Remains inpatient appropriate because: Severity of illness  Level of care: Telemetry  Consultants:  Cardiology   Objective: Vitals:   05/06/23 0046 05/06/23 0500 05/06/23 0606 05/06/23 0906  BP: 120/70  110/78 115/72  Pulse: (!) 55  71 70  Resp: 19  20   Temp: 97.8 F (36.6 C)  97.8 F (36.6 C)   TempSrc: Oral  Oral   SpO2: 99%  98%   Weight:  (!) 146.1 kg    Height:        Intake/Output Summary (Last 24 hours) at 05/06/2023 7846 Last data filed at 05/06/2023 0158 Gross per 24 hour  Intake 720 ml  Output 1900 ml  Net -1180 ml   Wt Readings from Last 3  Encounters:  05/06/23 (!) 146.1 kg  05/04/23 (!) 148.8 kg  12/09/22 (!) 153.3 kg    Examination:  Constitutional: NAD Eyes: no scleral icterus ENMT: Mucous membranes are moist.  Neck: normal, supple Respiratory: Faint bibasilar crackles but mostly clear, no wheezing Cardiovascular: Regular rate and rhythm, no murmurs / rubs / gallops.  1+ lower extremity edema Abdomen: non distended, no tenderness. Bowel sounds positive.  Musculoskeletal: no clubbing / cyanosis.   Data Reviewed: I have independently reviewed following labs and imaging studies   CBC Recent Labs  Lab 05/04/23 1645 05/05/23 0346 05/06/23 0504  WBC 9.3 9.4 9.9  HGB 15.0 14.8 16.1  HCT 49.7 48.9 53.3*  PLT 248 262 272  MCV 86.9 87.5 89.0  MCH 26.2 26.5 26.9  MCHC 30.2 30.3 30.2  RDW 15.8* 15.8* 16.9*  LYMPHSABS 0.9 1.4  --   MONOABS 0.9 0.9  --   EOSABS 0.1 0.0  --   BASOSABS 0.1 0.1  --     Recent Labs  Lab 05/04/23 1645 05/05/23 0346 05/05/23 0619 05/05/23 0633 05/06/23 0504  NA 140  --   --  138 137  K 3.2*  --   --  3.2* 4.0  CL 103  --   --  99 96*  CO2 24  --   --  28 27  GLUCOSE 90  --   --  104* 86  BUN 16  --   --  18 21*  CREATININE 1.11  --   --  1.17 1.57*  CALCIUM 8.9  --   --  8.7* 8.9  AST 21  --   --  22  --   ALT 12  --   --  11  --   ALKPHOS 70  --   --  67  --   BILITOT 2.0*  --   --  1.9*  --   ALBUMIN 3.5  --   --  3.2*  --   MG  --   --   --  1.7  --   TSH  --   --  1.853  --   --   HGBA1C  --  10.2*  --   --   --   BNP 1,275.2*  --   --   --   --     ------------------------------------------------------------------------------------------------------------------ No results for input(s): "CHOL", "HDL", "LDLCALC", "TRIG", "CHOLHDL", "LDLDIRECT" in the last 72 hours.  Lab Results  Component Value Date   HGBA1C 10.2 (H) 05/05/2023   ------------------------------------------------------------------------------------------------------------------ Recent Labs     05/05/23 0619  TSH 1.853    Cardiac Enzymes No results for input(s): "CKMB", "TROPONINI", "MYOGLOBIN" in the last 168 hours.  Invalid input(s): "CK" ------------------------------------------------------------------------------------------------------------------    Component Value Date/Time   BNP 1,275.2 (H) 05/04/2023 1645   BNP 32.2 10/01/2011 1432    CBG: Recent Labs  Lab 05/06/23 0151 05/06/23 0233 05/06/23 0340 05/06/23 0603 05/06/23 0741  GLUCAP 45* 61* 154* 79 121*    Recent Results (from the past 240 hours)  Resp panel by RT-PCR (RSV, Flu A&B, Covid) Anterior Nasal Swab     Status: None   Collection Time: 05/04/23  4:45 PM   Specimen: Anterior Nasal Swab  Result Value Ref Range Status   SARS Coronavirus 2 by RT PCR NEGATIVE NEGATIVE Final    Comment: (NOTE) SARS-CoV-2 target nucleic acids are NOT DETECTED.  The SARS-CoV-2 RNA is generally detectable in upper respiratory specimens during the acute phase of infection. The lowest concentration of SARS-CoV-2 viral copies this assay can detect is 138 copies/mL. A negative result does not preclude SARS-Cov-2 infection and should not be used as the sole basis for treatment or other patient management decisions. A negative result may occur with  improper specimen collection/handling, submission of specimen other than nasopharyngeal swab, presence of viral mutation(s) within the areas targeted by this assay, and inadequate number of viral copies(<138 copies/mL). A negative result must be combined with clinical observations, patient history, and epidemiological information. The expected result is Negative.  Fact Sheet for Patients:  BloggerCourse.com  Fact Sheet for Healthcare Providers:  SeriousBroker.it  This test is no t yet approved or cleared by the [place] and  has been authorized for  detection and/or diagnosis of SARS-CoV-2 by FDA under an  Emergency Use Authorization (EUA). This EUA will remain  in effect (meaning this test can be used) for the duration of the COVID-19 declaration under Section 564(b)(1) of the Act, 21 U.S.C.section 360bbb-3(b)(1), unless the authorization is terminated  or revoked sooner.       Influenza A by PCR NEGATIVE NEGATIVE Final   Influenza B by PCR NEGATIVE NEGATIVE Final    Comment: (NOTE) The Xpert Xpress SARS-CoV-2/FLU/RSV plus assay is intended as an aid in the diagnosis of influenza from Nasopharyngeal swab specimens and should not be used as a sole basis for treatment. Nasal washings and aspirates are unacceptable for Xpert Xpress SARS-CoV-2/FLU/RSV testing.  Fact Sheet for Patients: BloggerCourse.com  Fact Sheet for Healthcare Providers: SeriousBroker.it  This test is not yet approved or cleared by the [place] FDA and has been authorized for detection and/or diagnosis of SARS-CoV-2 by FDA under an Emergency Use Authorization (EUA). This EUA will remain in effect (meaning this test can be used) for the duration of the COVID-19 declaration under Section 564(b)(1) of the Act, 21 U.S.C. section 360bbb-3(b)(1), unless the authorization is terminated or revoked.     Resp Syncytial Virus by PCR NEGATIVE NEGATIVE Final    Comment: (NOTE) Fact Sheet for Patients: BloggerCourse.com  Fact Sheet for Healthcare Providers: SeriousBroker.it  This test is not yet approved or cleared by the [place] FDA and has been authorized for detection and/or diagnosis of SARS-CoV-2 by FDA under an Emergency Use Authorization (EUA). This EUA will remain in effect (meaning this test can be used) for the duration of the COVID-19 declaration under Section 564(b)(1) of the Act, 21 U.S.C. section 360bbb-3(b)(1), unless the authorization is terminated or revoked.  Performed at [facility], [address] [place], [place] [address]      Radiology Studies: ECHOCARDIOGRAM COMPLETE Result Date: 05/05/2023    ECHOCARDIOGRAM REPORT   Patient Name:   Dylan Owen [facility] Date of Exam: 05/05/2023 Medical Rec #:  [id number]         Height:       73.0 in Accession #:    [id number]        Weight:       328.0 lb Date of Birth:  1966/01/26         BSA:          2.655 m Patient Age:    58 years          BP:           171/97 mmHg Patient Gender: M                 HR:           85 bpm. Exam Location:  Inpatient Procedure: 2D Echo, Color Doppler, Cardiac Doppler and Intracardiac            Opacification Agent Indications:    Congestive Heart Failure I50.9  History:        Patient has no prior history of Echocardiogram examinations.                 CHF; Risk Factors:Hypertension, Diabetes and HLD.  Sonographer:    Webb Laws Referring Phys: [id number] ARSHAD N KAKRAKANDY IMPRESSIONS  1. Cannot accurately estimate EF despite addition fo Definity contrast due to forshortened views and shadowing. The left ventricle demonstrates regional wall motion abnormalities (see scoring diagram/findings for description). There is hypokinesis of the apex and mid to  distal anterior wall and anteroseptum.There is mild asymmetric left ventricular hypertrophy. Left ventricular diastolic parameters are indeterminate. Elevated left ventricular end-diastolic pressure.  2. Right ventricular systolic function is normal. The right ventricular size is normal. There is mildly elevated pulmonary artery systolic pressure. The estimated right ventricular systolic pressure is 39.4 mmHg.  3. The mitral valve is normal in structure. Mild mitral valve regurgitation. No evidence of mitral stenosis.  4. The aortic valve is normal in structure. Aortic valve regurgitation is not visualized. No aortic stenosis is present.  5. The inferior vena cava is dilated in size with <50% respiratory variability, suggesting right atrial pressure of 15  mmHg.  6. Consider repeat limited echo vs cardiac MRI for further assessment of EF FINDINGS  Left Ventricle: Cannot accurately estimate EF despite definity contrast due to poor image quality, forshortened views and shadowing. The left ventricle demonstrates regional wall motion abnormalities. Definity contrast agent was given IV to delineate the left ventricular endocardial borders. The left ventricular internal cavity size was normal in size. There is mild asymmetric left ventricular hypertrophy. Left ventricular diastolic parameters are indeterminate. Elevated left ventricular end-diastolic pressure. Right Ventricle: The right ventricular size is normal. No increase in right ventricular wall thickness. Right ventricular systolic function is normal. There is mildly elevated pulmonary artery systolic pressure. The tricuspid regurgitant velocity is 2.47  m/s, and with an assumed right atrial pressure of 15 mmHg, the estimated right ventricular systolic pressure is 39.4 mmHg. Left Atrium: Left atrial size was normal in size. Right Atrium: Right atrial size was normal in size. Pericardium: There is no evidence of pericardial effusion. Mitral Valve: The mitral valve is normal in structure. Mild mitral valve regurgitation. No evidence of mitral valve stenosis. Tricuspid Valve: The tricuspid valve is normal in structure. Tricuspid valve regurgitation is mild . No evidence of tricuspid stenosis. Aortic Valve: The aortic valve is normal in structure. Aortic valve regurgitation is not visualized. No aortic stenosis is present. Pulmonic Valve: The pulmonic valve was normal in structure. Pulmonic valve regurgitation is mild. No evidence of pulmonic stenosis. Aorta: The aortic root is normal in size and structure. Venous: The inferior vena cava is dilated in size with less than 50% respiratory variability, suggesting right atrial pressure of 15 mmHg. IAS/Shunts: No atrial level shunt detected by color flow Doppler.  LEFT  VENTRICLE PLAX 2D LVIDd:         5.60 cm      Diastology LVIDs:         4.30 cm      LV e' medial:    4.24 cm/s LV PW:         1.20 cm      LV E/e' medial:  21.5 LV IVS:        1.20 cm      LV e' lateral:   6.31 cm/s LVOT diam:     2.00 cm      LV E/e' lateral: 14.5 LV SV:         27 LV SV Index:   10 LVOT Area:     3.14 cm  LV Volumes (MOD) LV vol d, MOD A2C: 162.0 ml LV vol d, MOD A4C: 183.0 ml LV vol s, MOD A2C: 127.0 ml LV vol s, MOD A4C: 114.0 ml LV SV MOD A2C:     35.0 ml LV SV MOD A4C:     183.0 ml LV SV MOD BP:      56.9 ml RIGHT VENTRICLE  IVC RV S prime:     7.07 cm/s  IVC diam: 3.30 cm TAPSE (M-mode): 2.2 cm LEFT ATRIUM             Index        RIGHT ATRIUM           Index LA diam:        4.40 cm 1.66 cm/m   RA Area:     19.40 cm LA Vol (A2C):   38.0 ml 14.31 ml/m  RA Volume:   55.20 ml  20.79 ml/m LA Vol (A4C):   41.7 ml 15.71 ml/m LA Biplane Vol: 40.9 ml 15.40 ml/m  AORTIC VALVE             PULMONIC VALVE LVOT Vmax:   57.50 cm/s  PR End Diast Vel: 1.70 msec LVOT Vmean:  40.900 cm/s LVOT VTI:    0.086 m  AORTA Ao Root diam: 3.40 cm Ao Asc diam:  3.30 cm MITRAL VALVE               TRICUSPID VALVE MV Area (PHT): 9.37 cm    TR Peak grad:   24.4 mmHg MV Decel Time: 81 msec     TR Vmax:        247.00 cm/s MV E velocity: 91.30 cm/s MV A velocity: 59.10 cm/s  SHUNTS MV E/A ratio:  1.54        Systemic VTI:  0.09 m                            Systemic Diam: 2.00 cm Armanda Magic MD Electronically signed by Armanda Magic MD Signature Date/Time: 05/05/2023/1:20:27 PM    Final    VAS [place] LOWER EXTREMITY VENOUS (DVT) Result Date: 05/05/2023  Lower Venous DVT Study Patient Name:  Dylan Owen [facility]  Date of Exam:   05/05/2023 Medical Rec #: [id number]          Accession #:    [id number] Date of Birth: [birth year]          Patient Gender: M Patient Age:   77 years Exam Location:  [facility] Procedure:      VAS [place] LOWER EXTREMITY VENOUS (DVT) Referring Phys: Midge Minium  --------------------------------------------------------------------------------  Indications: Edema.  Risk Factors: Obesity. Limitations: Body habitus and bandages. Comparison Study: None. Performing Technologist: Shona Simpson  Examination Guidelines: A complete evaluation includes B-mode imaging, spectral Doppler, color Doppler, and power Doppler as needed of all accessible portions of each vessel. Bilateral testing is considered an integral part of a complete examination. Limited examinations for reoccurring indications may be performed as noted. The reflux portion of the exam is performed with the patient in reverse Trendelenburg.  +---------+---------------+---------+-----------+----------+-------------------+ RIGHT    CompressibilityPhasicitySpontaneityPropertiesThrombus Aging      +---------+---------------+---------+-----------+----------+-------------------+ CFV      Full           Yes      Yes                                      +---------+---------------+---------+-----------+----------+-------------------+ SFJ      Full                                                             +---------+---------------+---------+-----------+----------+-------------------+  FV Prox  Full                                                             +---------+---------------+---------+-----------+----------+-------------------+ FV Mid   Full                                                             +---------+---------------+---------+-----------+----------+-------------------+ FV DistalFull                                         Not well visualized +---------+---------------+---------+-----------+----------+-------------------+ PFV      Full                                                             +---------+---------------+---------+-----------+----------+-------------------+ POP      Full           Yes      Yes                                       +---------+---------------+---------+-----------+----------+-------------------+ PTV      Full                                                             +---------+---------------+---------+-----------+----------+-------------------+ PERO     Full                                         Not well visualized +---------+---------------+---------+-----------+----------+-------------------+   +---------+---------------+---------+-----------+----------+-------------------+ LEFT     CompressibilityPhasicitySpontaneityPropertiesThrombus Aging      +---------+---------------+---------+-----------+----------+-------------------+ CFV      Full           Yes      Yes                                      +---------+---------------+---------+-----------+----------+-------------------+ SFJ      Full                                                             +---------+---------------+---------+-----------+----------+-------------------+ FV Prox  Full                                                             +---------+---------------+---------+-----------+----------+-------------------+  FV Mid   Full                                                             +---------+---------------+---------+-----------+----------+-------------------+ FV DistalFull                                         Not well visualized +---------+---------------+---------+-----------+----------+-------------------+ PFV      Full                                                             +---------+---------------+---------+-----------+----------+-------------------+ POP      Full           Yes      Yes                                      +---------+---------------+---------+-----------+----------+-------------------+ PTV      Full                                                             +---------+---------------+---------+-----------+----------+-------------------+  PERO                             Yes                  Not well visualized +---------+---------------+---------+-----------+----------+-------------------+     Summary: BILATERAL: - No evidence of deep vein thrombosis seen in the lower extremities, bilaterally. -No evidence of popliteal cyst, bilaterally.   *See table(s) above for measurements and observations. Electronically signed by Sherald Hess MD on 05/05/2023 at 11:32:53 AM.    Final      Pamella Pert, MD, PhD [facility]  Between 7 am - 7 pm I am available, please contact me via Amion (for emergencies) or Securechat (non urgent messages)  Between 7 pm - 7 am I am not available, please contact night coverage MD/APP via Amion

## 2023-05-06 NOTE — Progress Notes (Incomplete)
    Patient Name: Dylan Owen           DOB: 30-Jan-1966  MRN: [id number]      Admission Date: 05/04/2023  Attending Provider: Leatha Gilding, MD  Primary Diagnosis: Acute CHF (congestive heart failure) [facility])   Level of care: Stepdown    CROSS COVER NOTE   Date of Service   05/06/2023   Dylan Owen, 58 y.o. male, was admitted on 05/04/2023 for Acute CHF (congestive heart failure) (HCC).    HPI/Events of Note   Rapid Response: "unresponsive, diaphoretic" Patient had similar episodes during daytime with multiple episodes of hypoglycemia in the past 24 hours.  Has history of diabetes and glargine dose of 20 units was given in the morning.   Head CT ordered by MD due to AMS.  Plan is to transfer to SDU.   Current CBG 84; all other vitals stable, unable to obtain accurate pulse ox due to cold/ clammy extremity. Currently reading 85% SpO2 on 3 L [place]. Hx of obstructive sleep apnea, noncompliant on CPAP.    Plan:  Give 1 amp D50 EKG, troponin- NSR with PVC, nonspecific ST abnormalities, prolonged QT VBG Head CT on the way down to SDU Hold sedative agents.  Bedside Assessment:  Patient is awake.  Nonverbal, however making grunting noises. He is pale and clammy.   Unable to follow commands at this time, but able to withdraw extremities when painful stimuli is applied.   Respiratory: Bilaterally clear, diminished bases.  No wheezing, no crackles. Normal effort. No accessory muscle use.  Cardiovascular: Regular rate and rhythm.  BLE edema +2/+3 Abdomen: Abdomen is soft and nontender.  Obese.  Positive bowel sounds in all quadrants.    Addendum: After receiving amp of D50, patient is verbal, but confused.  Has some delayed response, but appears to be more interactive.  Will check CBG hourly.  Head CT pending.    2150-  Acute hypoxic hypercarbic respiratory failure Obstructive sleep apnea, noncompliant with CPAP Blood gas showing respiratory acidosis; pH 7.25, pCO2 73,  pO2< 31. Placing on BiPAP.   Repeat blood gas in the am.   Patient more responsive, but still confused.  Alert to self only.    Febrile, max temp 102.  Tylenol given.   Sepsis workup started.  Blood culture, chest x-ray, UA, CBC, lactic acid, procalcitonin, COVID PCR.  Respiratory panel negative.   2350- Head CT-no acute intracranial abnormalities. Chest x-ray-mild cardiomegaly.  No focal airspace consolidations or pulmonary edema.   Sepsis, unknown source Lactic acid 3.7.  Febrile with max temp of 102.6.  Leukocytosis 12.8  Plan- broad-spectrum antibiotics cefepime and Flagyl.  Repeat lactic acid after low-volume fluid bolus.  Hypoglycemic Diabetes mellitus type 2 Continue CBGs hourly.  Start D10 gtt.    Interventions/ Plan   X        Anthoney Harada, DNP, ACNPC- AG [facility] [place]

## 2023-05-06 NOTE — Progress Notes (Signed)
Patient lying in bed very clammy/diaphoretic. Patient will not respond to verbal questions BP 141/119 Pulse 60 Oxygen Saturation 84 on 2L  however fingers are very cold Oxygen up to 3 l . CN and Rapid Response called and MD sent notification.Marland Kitchen

## 2023-05-06 NOTE — Progress Notes (Signed)
Patient has now become more responsive and talking. Blood Sugar 98. MD in the room to assess the Patient. New orders placed for IV fluids and CT scan. Maintain Current plan of Care

## 2023-05-06 NOTE — Progress Notes (Addendum)
Patient transfer to the ICU, RN attempted to reach pt's family member through the phone at this time. No one answer and cannot leave the voice message.

## 2023-05-07 ENCOUNTER — Inpatient Hospital Stay (HOSPITAL_COMMUNITY): Payer: PPO

## 2023-05-07 ENCOUNTER — Other Ambulatory Visit: Payer: Self-pay

## 2023-05-07 DIAGNOSIS — I514 Myocarditis, unspecified: Secondary | ICD-10-CM | POA: Diagnosis not present

## 2023-05-07 DIAGNOSIS — J9601 Acute respiratory failure with hypoxia: Secondary | ICD-10-CM | POA: Diagnosis not present

## 2023-05-07 DIAGNOSIS — R570 Cardiogenic shock: Secondary | ICD-10-CM | POA: Diagnosis not present

## 2023-05-07 DIAGNOSIS — R4182 Altered mental status, unspecified: Secondary | ICD-10-CM | POA: Diagnosis not present

## 2023-05-07 DIAGNOSIS — I5021 Acute systolic (congestive) heart failure: Secondary | ICD-10-CM | POA: Diagnosis not present

## 2023-05-07 DIAGNOSIS — G4733 Obstructive sleep apnea (adult) (pediatric): Secondary | ICD-10-CM

## 2023-05-07 LAB — BLOOD GAS, ARTERIAL
Acid-Base Excess: 0.2 mmol/L (ref 0.0–2.0)
Acid-Base Excess: 0.9 mmol/L (ref 0.0–2.0)
Bicarbonate: 26.9 mmol/L (ref 20.0–28.0)
Bicarbonate: 27.5 mmol/L (ref 20.0–28.0)
Delivery systems: POSITIVE
Delivery systems: POSITIVE
Drawn by: 23281
Drawn by: 25770
Expiratory PAP: 6 cm[H2O]
Expiratory PAP: 6 cm[H2O]
FIO2: 40 %
FIO2: 35 %
Inspiratory PAP: 20 cm[H2O]
Inspiratory PAP: 20 cm[H2O]
Mode: POSITIVE
O2 Saturation: 100 %
O2 Saturation: 98.9 %
Patient temperature: 39
Patient temperature: 38.9
RATE: 22 {breaths}/min
pCO2 arterial: 56 mm[Hg] — ABNORMAL HIGH (ref 32–48)
pCO2 arterial: 55 mm[Hg] — ABNORMAL HIGH (ref 32–48)
pH, Arterial: 7.3 — ABNORMAL LOW (ref 7.35–7.45)
pH, Arterial: 7.31 — ABNORMAL LOW (ref 7.35–7.45)
pO2, Arterial: 123 mm[Hg] — ABNORMAL HIGH (ref 83–108)
pO2, Arterial: 117 mm[Hg] — ABNORMAL HIGH (ref 83–108)

## 2023-05-07 LAB — PROTIME-INR
INR: 1.7 — ABNORMAL HIGH (ref 0.8–1.2)
Prothrombin Time: 19.9 s — ABNORMAL HIGH (ref 11.4–15.2)

## 2023-05-07 LAB — HEPATITIS PANEL, ACUTE
HCV Ab: NONREACTIVE
Hep A IgM: NONREACTIVE
Hep B C IgM: NONREACTIVE
Hepatitis B Surface Ag: NONREACTIVE

## 2023-05-07 LAB — LACTIC ACID, PLASMA
Lactic Acid, Venous: 3.8 mmol/L (ref 0.5–1.9)
Lactic Acid, Venous: 3.3 mmol/L (ref 0.5–1.9)

## 2023-05-07 LAB — POCT I-STAT 7, (LYTES, BLD GAS, ICA,H+H)
Acid-Base Excess: 2 mmol/L (ref 0.0–2.0)
Bicarbonate: 29.7 mmol/L — ABNORMAL HIGH (ref 20.0–28.0)
Calcium, Ion: 1.05 mmol/L — ABNORMAL LOW (ref 1.15–1.40)
HCT: 47 % (ref 39.0–52.0)
Hemoglobin: 16 g/dL (ref 13.0–17.0)
O2 Saturation: 98 %
Patient temperature: 37.7
Potassium: 4.6 mmol/L (ref 3.5–5.1)
Sodium: 137 mmol/L (ref 135–145)
TCO2: 31 mmol/L (ref 22–32)
pCO2 arterial: 60.4 mm[Hg] — ABNORMAL HIGH (ref 32–48)
pH, Arterial: 7.303 — ABNORMAL LOW (ref 7.35–7.45)
pO2, Arterial: 131 mm[Hg] — ABNORMAL HIGH (ref 83–108)

## 2023-05-07 LAB — LIPID PANEL
Cholesterol: 120 mg/dL (ref 0–200)
HDL: 21 mg/dL — ABNORMAL LOW (ref >40)
LDL Cholesterol: 84 mg/dL (ref 0–99)
Total CHOL/HDL Ratio: 5.7 {ratio}
Triglycerides: 74 mg/dL (ref <150)
VLDL: 15 mg/dL (ref 0–40)

## 2023-05-07 LAB — COMPREHENSIVE METABOLIC PANEL
ALT: 454 U/L — ABNORMAL HIGH (ref 0–44)
AST: 1258 U/L — ABNORMAL HIGH (ref 15–41)
Albumin: 3.6 g/dL (ref 3.5–5.0)
Alkaline Phosphatase: 72 U/L (ref 38–126)
Anion gap: 18 — ABNORMAL HIGH (ref 5–15)
BUN: 28 mg/dL — ABNORMAL HIGH (ref 6–20)
CO2: 21 mmol/L — ABNORMAL LOW (ref 22–32)
Calcium: 9.2 mg/dL (ref 8.9–10.3)
Chloride: 99 mmol/L (ref 98–111)
Creatinine, Ser: 2.83 mg/dL — ABNORMAL HIGH (ref 0.61–1.24)
GFR, Estimated: 25 mL/min — ABNORMAL LOW (ref >60)
Glucose, Bld: 100 mg/dL — ABNORMAL HIGH (ref 70–99)
Potassium: 4.7 mmol/L (ref 3.5–5.1)
Sodium: 138 mmol/L (ref 135–145)
Total Bilirubin: 3.2 mg/dL — ABNORMAL HIGH (ref 0.0–1.2)
Total Protein: 7.5 g/dL (ref 6.5–8.1)

## 2023-05-07 LAB — BLOOD GAS, VENOUS
Acid-Base Excess: 1.6 mmol/L (ref 0.0–2.0)
Bicarbonate: 28.9 mmol/L — ABNORMAL HIGH (ref 20.0–28.0)
O2 Saturation: 92.3 %
Patient temperature: 37
pCO2, Ven: 56 mm[Hg] (ref 44–60)
pH, Ven: 7.32 (ref 7.25–7.43)
pO2, Ven: 64 mm[Hg] — ABNORMAL HIGH (ref 32–45)

## 2023-05-07 LAB — URINALYSIS, ROUTINE W REFLEX MICROSCOPIC
Bacteria, UA: NONE SEEN
Bilirubin Urine: NEGATIVE
Glucose, UA: 50 mg/dL — AB
Ketones, ur: NEGATIVE mg/dL
Nitrite: NEGATIVE
Protein, ur: 100 mg/dL — AB
Specific Gravity, Urine: 1.02 (ref 1.005–1.030)
pH: 7 (ref 5.0–8.0)

## 2023-05-07 LAB — GLUCOSE, CAPILLARY
Glucose-Capillary: 99 mg/dL (ref 70–99)
Glucose-Capillary: 93 mg/dL (ref 70–99)
Glucose-Capillary: 92 mg/dL (ref 70–99)
Glucose-Capillary: 84 mg/dL (ref 70–99)
Glucose-Capillary: 92 mg/dL (ref 70–99)
Glucose-Capillary: 82 mg/dL (ref 70–99)
Glucose-Capillary: 120 mg/dL — ABNORMAL HIGH (ref 70–99)
Glucose-Capillary: 184 mg/dL — ABNORMAL HIGH (ref 70–99)
Glucose-Capillary: 67 mg/dL — ABNORMAL LOW (ref 70–99)
Glucose-Capillary: 63 mg/dL — ABNORMAL LOW (ref 70–99)
Glucose-Capillary: 64 mg/dL — ABNORMAL LOW (ref 70–99)
Glucose-Capillary: 87 mg/dL (ref 70–99)
Glucose-Capillary: 80 mg/dL (ref 70–99)

## 2023-05-07 LAB — CBC
HCT: 58.8 % — ABNORMAL HIGH (ref 39.0–52.0)
Hemoglobin: 17.3 g/dL — ABNORMAL HIGH (ref 13.0–17.0)
MCH: 26.4 pg (ref 26.0–34.0)
MCHC: 29.4 g/dL — ABNORMAL LOW (ref 30.0–36.0)
MCV: 89.6 fL (ref 80.0–100.0)
Platelets: 291 10*3/uL (ref 150–400)
RBC: 6.56 MIL/uL — ABNORMAL HIGH (ref 4.22–5.81)
RDW: 17.9 % — ABNORMAL HIGH (ref 11.5–15.5)
WBC: 13.4 10*3/uL — ABNORMAL HIGH (ref 4.0–10.5)
nRBC: 0 % (ref 0.0–0.2)

## 2023-05-07 LAB — COOXEMETRY PANEL
Carboxyhemoglobin: 1.5 % (ref 0.5–1.5)
Carboxyhemoglobin: 1.4 % (ref 0.5–1.5)
Methemoglobin: 0.9 % (ref 0.0–1.5)
Methemoglobin: 0.7 % (ref 0.0–1.5)
O2 Saturation: 91.5 %
O2 Saturation: 80.8 %
Total hemoglobin: 14.1 g/dL (ref 12.0–16.0)
Total hemoglobin: 14.4 g/dL (ref 12.0–16.0)

## 2023-05-07 LAB — CG4 I-STAT (LACTIC ACID): Lactic Acid, Venous: 1.4 mmol/L (ref 0.5–1.9)

## 2023-05-07 LAB — TROPONIN I (HIGH SENSITIVITY): Troponin I (High Sensitivity): 109 ng/L (ref <18)

## 2023-05-07 LAB — MRSA NEXT GEN BY PCR, NASAL: MRSA by PCR Next Gen: NOT DETECTED

## 2023-05-07 LAB — AMMONIA: Ammonia: <10 umol/L (ref 9–35)

## 2023-05-07 LAB — PROCALCITONIN: Procalcitonin: 0.21 ng/mL

## 2023-05-07 LAB — SEDIMENTATION RATE: Sed Rate: 2 mm/h (ref 0–16)

## 2023-05-07 LAB — C-REACTIVE PROTEIN: CRP: 5.1 mg/dL — ABNORMAL HIGH (ref <1.0)

## 2023-05-07 LAB — MAGNESIUM: Magnesium: 2.1 mg/dL (ref 1.7–2.4)

## 2023-05-07 MED ORDER — ROCURONIUM BROMIDE 10 MG/ML (PF) SYRINGE
PREFILLED_SYRINGE | INTRAVENOUS | Status: AC
  Filled 2023-05-07: qty 10

## 2023-05-07 MED ORDER — FENTANYL CITRATE PF 50 MCG/ML IJ SOSY
25.0000 ug | PREFILLED_SYRINGE | INTRAMUSCULAR | Status: DC | PRN
  Filled 2023-05-07: qty 1

## 2023-05-07 MED ORDER — PIPERACILLIN-TAZOBACTAM 3.375 G IVPB 30 MIN
3.3750 g | Freq: Once | INTRAVENOUS | Status: AC
  Administered 2023-05-07: 3.375 g via INTRAVENOUS
  Filled 2023-05-07: qty 50

## 2023-05-07 MED ORDER — SODIUM CHLORIDE 0.9% FLUSH: 10.0000 mL | INTRAVENOUS | Status: DC | PRN

## 2023-05-07 MED ORDER — ZINC OXIDE 40 % EX OINT
TOPICAL_OINTMENT | Freq: Three times a day (TID) | CUTANEOUS | Status: DC | PRN
  Filled 2023-05-07: qty 57

## 2023-05-07 MED ORDER — FUROSEMIDE 10 MG/ML IJ SOLN
20.0000 mg/h | INTRAVENOUS | Status: DC
  Administered 2023-05-07 – 2023-05-08 (×2): 20 mg/h via INTRAVENOUS
  Filled 2023-05-07 (×3): qty 20

## 2023-05-07 MED ORDER — FUROSEMIDE 10 MG/ML IJ SOLN
80.0000 mg | Freq: Three times a day (TID) | INTRAMUSCULAR | Status: DC
Start: 2023-05-07 — End: 2023-05-07
  Administered 2023-05-07: 80 mg via INTRAVENOUS
  Filled 2023-05-07: qty 8

## 2023-05-07 MED ORDER — LIDOCAINE HCL URETHRAL/MUCOSAL 2 % EX GEL
1.0000 | Freq: Once | CUTANEOUS | Status: AC
  Administered 2023-05-07: 1 via URETHRAL
  Filled 2023-05-07: qty 5

## 2023-05-07 MED ORDER — SODIUM CHLORIDE 0.9% FLUSH
10.0000 mL | Freq: Two times a day (BID) | INTRAVENOUS | Status: DC
  Administered 2023-05-07 – 2023-05-11 (×9): 10 mL

## 2023-05-07 MED ORDER — IBUPROFEN 200 MG PO TABS
600.0000 mg | ORAL_TABLET | Freq: Once | ORAL | Status: AC
Start: 2023-05-07 — End: 2023-05-07
  Administered 2023-05-07: 600 mg via ORAL
  Filled 2023-05-07: qty 3

## 2023-05-07 MED ORDER — DEXTROSE 50 % IV SOLN
12.5000 g | INTRAVENOUS | Status: AC
Start: 2023-05-07 — End: 2023-05-07
  Administered 2023-05-07: 12.5 g via INTRAVENOUS
  Filled 2023-05-07: qty 50

## 2023-05-07 MED ORDER — ETOMIDATE 2 MG/ML IV SOLN
INTRAVENOUS | Status: AC
  Filled 2023-05-07: qty 20

## 2023-05-07 MED ORDER — LACTATED RINGERS IV BOLUS
250.0000 mL | Freq: Once | INTRAVENOUS | Status: AC
  Administered 2023-05-07: 250 mL via INTRAVENOUS

## 2023-05-07 MED ORDER — LACTATED RINGERS IV BOLUS
500.0000 mL | Freq: Once | INTRAVENOUS | Status: AC
  Administered 2023-05-07: 500 mL via INTRAVENOUS

## 2023-05-07 MED ORDER — NOREPINEPHRINE 4 MG/250ML-% IV SOLN
2.0000 ug/min | INTRAVENOUS | Status: DC
  Administered 2023-05-07: 2 ug/min via INTRAVENOUS
  Administered 2023-05-07 – 2023-05-08 (×2): 4 ug/min via INTRAVENOUS
  Filled 2023-05-07 (×3): qty 250

## 2023-05-07 MED ORDER — MILRINONE LACTATE IN DEXTROSE 20-5 MG/100ML-% IV SOLN
0.2500 ug/kg/min | INTRAVENOUS | Status: DC
  Administered 2023-05-07 – 2023-05-08 (×4): 0.25 ug/kg/min via INTRAVENOUS
  Filled 2023-05-07 (×4): qty 100

## 2023-05-07 MED ORDER — FENTANYL CITRATE PF 50 MCG/ML IJ SOSY
PREFILLED_SYRINGE | INTRAMUSCULAR | Status: AC
  Filled 2023-05-07: qty 2

## 2023-05-07 MED ORDER — FUROSEMIDE 10 MG/ML IJ SOLN
160.0000 mg | Freq: Once | INTRAMUSCULAR | Status: AC
  Administered 2023-05-07: 160 mg via INTRAVENOUS
  Filled 2023-05-07: qty 10

## 2023-05-07 MED ORDER — SODIUM CHLORIDE 0.9 % IV SOLN
250.0000 mL | INTRAVENOUS | Status: AC
  Administered 2023-05-07: 250 mL via INTRAVENOUS

## 2023-05-07 MED ORDER — PIPERACILLIN-TAZOBACTAM 3.375 G IVPB
3.3750 g | Freq: Three times a day (TID) | INTRAVENOUS | Status: DC
  Administered 2023-05-07 – 2023-05-08 (×2): 3.375 g via INTRAVENOUS
  Filled 2023-05-07 (×2): qty 50

## 2023-05-07 MED ORDER — KETAMINE HCL 50 MG/5ML IJ SOSY
PREFILLED_SYRINGE | INTRAMUSCULAR | Status: AC
  Filled 2023-05-07: qty 10

## 2023-05-07 MED ORDER — MIDAZOLAM HCL 2 MG/2ML IJ SOLN
INTRAMUSCULAR | Status: AC
  Filled 2023-05-07: qty 2

## 2023-05-07 NOTE — Consult Note (Signed)
Advanced Heart Failure Team Consult Note   Primary Physician: Jeoffrey Massed, MD Cardiologist:  None  Reason for Consultation: Cardiogenic shock  HPI:    Dylan Owen is seen today for evaluation of mixed septic/cardiogenic shock at the request of Dr. Elberta Fortis.   Patient somnolent and unable to take a history so obtained from wife at bedside.  She reports that about a week prior he had a viral infection with associated cough, runny nose.  His symptoms progressively got worse and he began developing shortness of breath on exertion along with chest pressure.  He also began developing worsening swelling of the extremities and weight gain.  He was admitted to [facility] for presumed heart failure.  He initially improved, but overnight became hypoglycemic, altered, requiring BiPAP placement.  He was cold and wet on evaluation this morning so was started on milrinone and transferred to [facility] for further workup and evaluation.  Objective:    Vital Signs:   Temp:  [97.6 F (36.4 C)-102.6 F (39.2 C)] 100.9 F (38.3 C) (02/01 1100) Pulse Rate:  [60-202] 72 (02/01 0800) Resp:  [14-38] 19 (02/01 0800) BP: (90-161)/(51-122) 129/66 (02/01 0800) SpO2:  [85 %-100 %] 99 % (02/01 0800) FiO2 (%):  [35 %-100 %] 50 % (02/01 1059) Weight:  [145.6 kg-145.7 kg] 145.7 kg (02/01 0500) Last BM Date : 05/06/23  Weight change: Filed Weights   05/06/23 0500 05/06/23 2000 05/07/23 0500  Weight: (!) 146.1 kg (!) 145.6 kg (!) 145.7 kg    Intake/Output:   Intake/Output Summary (Last 24 hours) at 05/07/2023 1207 Last data filed at 05/07/2023 0946 Gross per 24 hour  Intake 1019.34 ml  Output 5 ml  Net 1014.34 ml      Physical Exam    General:  obtunded, critically ill Cor: RRR, no m/g/r, elevated JVP, cool extremities, 2+ edema Lungs: diminshed breath sounds, BiPAP on Abdomen: soft, nontender, nondistended.   Labs   Basic Metabolic Panel: Recent Labs  Lab 05/05/23 0633 05/06/23 0504  05/06/23 2037 05/06/23 2226 05/07/23 0059  NA 138 137 139 138 138  K 3.2* 4.0 4.5 4.7 4.7  CL 99 96* 97* 95* 99  CO2 28 27 24 25  21*  GLUCOSE 104* 86 99 68* 100*  BUN 18 21* 26* 27* 28*  CREATININE 1.17 1.57* 2.45* 2.82* 2.83*  CALCIUM 8.7* 8.9 8.9 9.0 9.2  MG 1.7  --   --   --  2.1    Liver Function Tests: Recent Labs  Lab 05/04/23 1645 05/05/23 0633 05/06/23 2226 05/07/23 0059  AST 21 22 625* 1,258*  ALT 12 11 252* 454*  ALKPHOS 70 67 70 72  BILITOT 2.0* 1.9* 3.1* 3.2*  PROT 7.4 7.0 7.6 7.5  ALBUMIN 3.5 3.2* 3.4* 3.6   No results for input(s): "LIPASE", "AMYLASE" in the last 168 hours. Recent Labs  Lab 05/07/23 0836  AMMONIA <10    CBC: Recent Labs  Lab 05/04/23 1645 05/05/23 0346 05/06/23 0504 05/06/23 2226 05/07/23 0059  WBC 9.3 9.4 9.9 12.8* 13.4*  NEUTROABS 7.4 6.9  --  9.5*  --   HGB 15.0 14.8 16.1 16.8 17.3*  HCT 49.7 48.9 53.3* 57.6* 58.8*  MCV 86.9 87.5 89.0 89.6 89.6  PLT 248 262 272 316 291    Cardiac Enzymes: No results for input(s): "CKTOTAL", "CKMB", "CKMBINDEX", "TROPONINI" in the last 168 hours.  BNP: BNP (last 3 results) Recent Labs    05/04/23 1645  BNP 1,275.2*  ProBNP (last 3 results) No results for input(s): "PROBNP" in the last 8760 hours.   CBG: Recent Labs  Lab 05/07/23 0259 05/07/23 0415 05/07/23 0526 05/07/23 0802 05/07/23 1110  GLUCAP 92 84 92 82 120*    Coagulation Studies: Recent Labs    05/07/23 0836  LABPROT 19.9*  INR 1.7*      Medications:     Current Medications:  aspirin  81 mg Oral Daily   Chlorhexidine Gluconate Cloth  6 each Topical Daily   enoxaparin (LOVENOX) injection  70 mg Subcutaneous Q24H   furosemide  80 mg Intravenous Q8H   insulin aspart  0-9 Units Subcutaneous TID WC   pantoprazole  40 mg Oral Daily   potassium chloride  40 mEq Oral Daily   spironolactone  12.5 mg Oral Daily   venlafaxine XR  150 mg Oral BID    Infusions:  sodium chloride     milrinone 0.25  mcg/kg/min (05/07/23 0845)   norepinephrine (LEVOPHED) Adult infusion     piperacillin-tazobactam 3.375 g (05/07/23 1138)   piperacillin-tazobactam (ZOSYN)  IV        Patient Profile   Dylan Owen is a 58 y.o. male with a PMH of obesity, HTN, HLD, DM2, OSA who presents to the [facility] ICU with mixed septic and cardiogenic shock.  Assessment/Plan   Mixed cardiogenic/septic shock: Febrile, cold on exam, with worsening blood pressure.  He appears profoundly volume up but is likely in the anuric ATN phase. Etiology unclear, likely viral myocarditis but will need CMR/coronary evaluation when recovered. - Start milrinone/norepinephrine - PICC line placement - Coox and repeat lactic acid - Abx per CCM - CMR/coronary evaluation when recovered - CRP/ESR  Acute hypoxic and hypercarbic acidosis: Currently obtunded on BiPAP, protecting airway. ABG pending, may require intubation for worsening respiratory acidosis. - Continue positive pressure support - ABG - Treatment of likely infection as above  AKI: Suspect due to ongoing shock, diuresis with 80mg  IV lasix started. May require gtt especially once vasodilatory shock improves - Lasix 80mg  TID  Shock liver: Increasing rapidly with ongoing shock. - Management as above    Medication concerns reviewed with patient and pharmacy team. Barriers identified: Antibiotic dosing  Length of Stay: 2  [name], MD  05/07/2023, 12:07 PM  Advanced Heart Failure Team Pager [phone] (M-F; 7a - 5p)  Please contact [facility] for night-coverage after hours (4p -7a ) and weekends on [email or web address]   CRITICAL CARE Performed by: [name]   Total critical care time: 60 minutes  Critical care time was exclusive of separately billable procedures and treating other patients.  Critical care was necessary to treat or prevent imminent or life-threatening deterioration.  Critical care was time spent personally by me on the following  activities: development of treatment plan with patient and/or surrogate as well as nursing, discussions with consultants, evaluation of patient's response to treatment, examination of patient, obtaining history from patient or surrogate, ordering and performing treatments and interventions, ordering and review of laboratory studies, ordering and review of radiographic studies, pulse oximetry and re-evaluation of patient's condition.

## 2023-05-07 NOTE — Plan of Care (Signed)
  Problem: Fluid Volume: Goal: Ability to maintain a balanced intake and output will improve Outcome: Progressing   Problem: Skin Integrity: Goal: Risk for impaired skin integrity will decrease Outcome: Progressing   Problem: Clinical Measurements: Goal: Ability to maintain clinical measurements within normal limits will improve Outcome: Progressing Goal: Will remain free from infection Outcome: Progressing Goal: Diagnostic test results will improve Outcome: Progressing Goal: Respiratory complications will improve Outcome: Progressing Goal: Cardiovascular complication will be avoided Outcome: Progressing

## 2023-05-07 NOTE — Progress Notes (Addendum)
PROGRESS NOTE  Dylan Owen ZOX:[id number] DOB: Jul 02, 1965 DOA: 05/04/2023 PCP: Jeoffrey Massed, MD   LOS: 2 days   Brief Narrative / Interim history: 58 year old male with history of obesity, HTN, HLD, DM2, OSA comes into the hospital with increasing shortness of breath.  This is been going on for the past week, patient has been having increased cough, congestion as well as getting chest pains which are worse with coughing.  He also noted fluid building up and a 10 pound weight gain.  He does have furosemide at home that he is supposedly taking as needed, has been trying few doses without help.  Was seen by his PCP and sent to the ER for evaluation.  Hospital course complicated by worsening encephalopathy 1/31 evening, fever, development of elevated LFTs and AKI.  Subjective / 24h Interval events: Rapid response called last night, patient was transferred to stepdown due to increased confusion, fever, AKI and elevated LFTs  Seen this morning, requiring BiPAP, can open his eyes and denies any specific complaints but appears uncomfortable and tachypneic.  Assesement and Plan: Principal Problem:   Acute CHF (congestive heart failure) (HCC) Active Problems:   SLEEP APNEA, OBSTRUCTIVE   PERIPHERAL NEUROPATHY   Essential hypertension   GERD   LOW BACK PAIN   Hyperlipidemia   Diabetes mellitus with complication (HCC)   Depression  Principal problem Acute systolic CHF patient admitted to the hospital with peripheral edema, elevated BNP and a chest x-ray showing vascular congestion.  A 2D echocardiogram could not formally evaluate ejection fraction but did appear low based on cardiology, and it did show regional wall motion abnormalities of the apex and mid to distal anterior wall and anteroseptum -Cardiology consulted, appreciate input, there were tentative plans for left and right heart cath -Decompensated overnight with possible shock, hypotensive, AKI, elevated LFTs and acute  encephalopathy  Active problems Shock, concern for cardiogenic versus septic-patient decompensated to 1/31 in the evening, had few hypoglycemic episodes and intermittent confusion.  He was transferred to stepdown, became febrile, hypotensive with signs of shock with elevated LFTs and acute kidney injury -Received bolus, continue dextrose infusion -Consult critical care this morning, will discuss with cardiology as well -Suspect may need a limited echo to assess heart function, possible pressors/inotropes -Blood cultures were obtained, has been placed on broad-spectrum antibiotics, continue  Acute kidney injury-in the setting of diuresis with Lasix as well as shock as above  Elevated LFTs-obtain right upper quadrant ultrasound, acute hepatitis panel for completeness.  Obtain ammonia as well as INR  Essential hypertension-now hypotensive  Acute hypoxic respiratory failure-due to #1, on Bipap  Chronic low back pain-continue home medications  Depression-continue Effexor  Sleep apnea-noncompliant with CPAP  Elevated troponin-not in a pattern consistent with ACS but suspect more so related to #1 and #2  Hyperlipidemia-on Repatha  DM2, poorly controlled-still was hypoglycemic, suspect in the setting of shock, Lantus decreased further  Lab Results  Component Value Date   HGBA1C 10.2 (H) 05/05/2023   CBG (last 3)  Recent Labs    05/07/23 0259 05/07/23 0415 05/07/23 0526  GLUCAP 92 84 92    Scheduled Meds:  aspirin  81 mg Oral Daily   Chlorhexidine Gluconate Cloth  6 each Topical Daily   dextrose       empagliflozin  10 mg Oral Daily   enoxaparin (LOVENOX) injection  70 mg Subcutaneous Q24H   furosemide  60 mg Intravenous BID   insulin aspart  0-9 Units Subcutaneous TID WC  insulin glargine-yfgn  10 Units Subcutaneous Daily   metoprolol succinate  25 mg Oral Daily   morphine  60 mg Oral BID   And   morphine  30 mg Oral Q24H   pantoprazole  40 mg Oral Daily   potassium  chloride  40 mEq Oral Daily   spironolactone  12.5 mg Oral Daily   venlafaxine XR  150 mg Oral BID   Continuous Infusions:  ceFEPime (MAXIPIME) IV     dextrose 75 mL/hr at 05/07/23 0507   metronidazole Stopped (05/07/23 0206)   PRN Meds:.acetaminophen **OR** acetaminophen, albuterol, dextrose, liver oil-zinc oxide, methocarbamol, nitroGLYCERIN, mouth rinse  Current Outpatient Medications  Medication Instructions   Acetaminophen (TYLENOL PO) 500 mg, Oral, Daily PRN   albuterol (VENTOLIN HFA) 108 (90 Base) MCG/ACT inhaler 1-2 puffs, Inhalation, Every 6 hours PRN   B-D ULTRAFINE III SHORT PEN 31G X 8 MM MISC USE AS DIRECTED TWICE DAILY FOR MEDICATION INJECTION   blood glucose meter kit and supplies KIT Dispense based on patient and insurance preference. Use to check glucose 4 times daily.   Continuous Glucose Receiver (FREESTYLE LIBRE 3 READER) DEVI Use to check glucose continuously   Continuous Glucose Sensor (FREESTYLE LIBRE 3 SENSOR) MISC Place 1 sensor on the skin every 14 days. Use to check glucose continuously   esomeprazole (NEXIUM) 40 mg, Oral, Daily   Evolocumab (REPATHA SURECLICK) 140 MG/ML SOAJ Subcutaneous, Every 14 days   FREESTYLE TEST STRIPS test strip USE TO CHECK GLUCOSE 4 TIMES DAILY.   furosemide (LASIX) 20 MG tablet TAKE 1 TABLET BY MOUTH EVERY MORNING AS NEEDED FOR SWELLING   hydrochlorothiazide (HYDRODIURIL) 25 mg, Oral, Daily   insulin aspart (NOVOLOG FLEXPEN) 100 UNIT/ML FlexPen 3 units SQ before breakfast, lunch, and dinner   insulin degludec (TRESIBA FLEXTOUCH) 100 UNIT/ML FlexTouch Pen 80 U qAM and 72 U qPM   lisinopril (ZESTRIL) 40 mg, Oral, Daily   methocarbamol (ROBAXIN) 500 mg, Oral, 2 times daily PRN   metoprolol tartrate (LOPRESSOR) 50 MG tablet TAKE 1 TABLET BY MOUTH TWICE A DAY. OFFICE VISIT NEEDED FOR FURTHER REFILLS   morphine (MS CONTIN) 30-60 mg, Oral, See admin instructions, Taking 60 mg in the AM and 30 mg in the afternoon and 60 mg at bedtime    Multiple Vitamin (MULTIVITAMIN) tablet 1 tablet, Oral, Daily,     nitroGLYCERIN (NITROSTAT) 0.4 MG SL tablet PLACE 1 TABLET UNDER THE TONGUE EVERY 5 MINUTES AS NEEDED.   venlafaxine XR (EFFEXOR-XR) 150 mg, 2 times daily    Diet Orders (From admission, onward)     Start     Ordered   05/06/23 2349  Diet NPO time specified Except for: Sips with Meds  Diet effective now       Comments: While on Bipap  Question:  Except for  Answer:  Clearance Coots with Meds   05/06/23 2349            DVT prophylaxis:    Lab Results  Component Value Date   PLT 291 05/07/2023      Code Status: Full Code  Family Communication: no family at bedside, will attempt to call the wife  Status is: Inpatient Remains inpatient appropriate because: Severity of illness  Level of care: Stepdown  Consultants:  Cardiology   Objective: Vitals:   05/07/23 0325 05/07/23 0400 05/07/23 0500 05/07/23 0600  BP: (!) 111/93 (!) 99/51 (!) 113/93 (!) 93/58  Pulse: 65 64 74 63  Resp: (!) 22 (!) 23 (!) 21 16  Temp: (!) 102.4 F (39.1 C) (!) 102.4 F (39.1 C) (!) 102.2 F (39 C) (!) 102 F (38.9 C)  TempSrc:  Rectal  Rectal  SpO2: 97% 94% 96% 99%  Weight:      Height:        Intake/Output Summary (Last 24 hours) at 05/07/2023 1610 Last data filed at 05/07/2023 0507 Gross per 24 hour  Intake 1019.34 ml  Output --  Net 1019.34 ml   Wt Readings from Last 3 Encounters:  05/06/23 (!) 145.6 kg  05/04/23 (!) 148.8 kg  12/09/22 (!) 153.3 kg    Examination:  Constitutional: Tachypneic, BiPAP on Eyes: Faint scleral icterus ENMT: mmm Neck: normal, supple Respiratory: Tachypnea, breathing with the BiPAP, no significant wheezing heard Cardiovascular: Tachycardiac, regular, 1+ edema Abdomen: soft, no distention, no tenderness. Bowel sounds positive.  Skin: no rashes, chronic venous stasis changes bilateral lower extremity Neurologic: no focal deficits, equal strength.   Data Reviewed: I have independently reviewed  following labs and imaging studies   CBC Recent Labs  Lab 05/04/23 1645 05/05/23 0346 05/06/23 0504 05/06/23 2226 05/07/23 0059  WBC 9.3 9.4 9.9 12.8* 13.4*  HGB 15.0 14.8 16.1 16.8 17.3*  HCT 49.7 48.9 53.3* 57.6* 58.8*  PLT 248 262 272 316 291  MCV 86.9 87.5 89.0 89.6 89.6  MCH 26.2 26.5 26.9 26.1 26.4  MCHC 30.2 30.3 30.2 29.2* 29.4*  RDW 15.8* 15.8* 16.9* 17.7* 17.9*  LYMPHSABS 0.9 1.4  --  0.7  --   MONOABS 0.9 0.9  --  2.3*  --   EOSABS 0.1 0.0  --  0.0  --   BASOSABS 0.1 0.1  --  0.1  --     Recent Labs  Lab 05/04/23 1645 05/05/23 0346 05/05/23 0619 05/05/23 9604 05/06/23 0504 05/06/23 2037 05/06/23 2226 05/07/23 0059 05/07/23 0527  NA 140  --   --  138 137 139 138 138  --   K 3.2*  --   --  3.2* 4.0 4.5 4.7 4.7  --   CL 103  --   --  99 96* 97* 95* 99  --   CO2 24  --   --  28 27 24 25  21*  --   GLUCOSE 90  --   --  104* 86 99 68* 100*  --   BUN 16  --   --  18 21* 26* 27* 28*  --   CREATININE 1.11  --   --  1.17 1.57* 2.45* 2.82* 2.83*  --   CALCIUM 8.9  --   --  8.7* 8.9 8.9 9.0 9.2  --   AST 21  --   --  22  --   --  625* 1,258*  --   ALT 12  --   --  11  --   --  252* 454*  --   ALKPHOS 70  --   --  67  --   --  70 72  --   BILITOT 2.0*  --   --  1.9*  --   --  3.1* 3.2*  --   ALBUMIN 3.5  --   --  3.2*  --   --  3.4* 3.6  --   MG  --   --   --  1.7  --   --   --  2.1  --   PROCALCITON  --   --   --   --   --   --  0.21  --   --   LATICACIDVEN  --   --   --   --   --   --  3.7* 3.8* 3.3*  TSH  --   --  1.853  --   --   --   --   --   --   HGBA1C  --  10.2*  --   --   --   --   --   --   --   BNP 1,275.2*  --   --   --   --   --   --   --   --     ------------------------------------------------------------------------------------------------------------------ Recent Labs    05/07/23 0059  CHOL 120  HDL 21*  LDLCALC 84  TRIG 74  CHOLHDL 5.7    Lab Results  Component Value Date   HGBA1C 10.2 (H) 05/05/2023    ------------------------------------------------------------------------------------------------------------------ Recent Labs    05/05/23 0619  TSH 1.853    Cardiac Enzymes No results for input(s): "CKMB", "TROPONINI", "MYOGLOBIN" in the last 168 hours.  Invalid input(s): "CK" ------------------------------------------------------------------------------------------------------------------    Component Value Date/Time   BNP 1,275.2 (H) 05/04/2023 1645   BNP 32.2 10/01/2011 1432    CBG: Recent Labs  Lab 05/07/23 0058 05/07/23 0157 05/07/23 0259 05/07/23 0415 05/07/23 0526  GLUCAP 99 93 92 84 92    Recent Results (from the past 240 hours)  Resp panel by RT-PCR (RSV, Flu A&B, Covid) Anterior Nasal Swab     Status: None   Collection Time: 05/04/23  4:45 PM   Specimen: Anterior Nasal Swab  Result Value Ref Range Status   SARS Coronavirus 2 by RT PCR NEGATIVE NEGATIVE Final    Comment: (NOTE) SARS-CoV-2 target nucleic acids are NOT DETECTED.  The SARS-CoV-2 RNA is generally detectable in upper respiratory specimens during the acute phase of infection. The lowest concentration of SARS-CoV-2 viral copies this assay can detect is 138 copies/mL. A negative result does not preclude SARS-Cov-2 infection and should not be used as the sole basis for treatment or other patient management decisions. A negative result may occur with  improper specimen collection/handling, submission of specimen other than nasopharyngeal swab, presence of viral mutation(s) within the areas targeted by this assay, and inadequate number of viral copies(<138 copies/mL). A negative result must be combined with clinical observations, patient history, and epidemiological information. The expected result is Negative.  Fact Sheet for Patients:  BloggerCourse.com  Fact Sheet for Healthcare Providers:  SeriousBroker.it  This test is no t yet approved  or cleared by the [place] FDA and  has been authorized for detection and/or diagnosis of SARS-CoV-2 by FDA under an Emergency Use Authorization (EUA). This EUA will remain  in effect (meaning this test can be used) for the duration of the COVID-19 declaration under Section 564(b)(1) of the Act, 21 U.S.C.section 360bbb-3(b)(1), unless the authorization is terminated  or revoked sooner.       Influenza A by PCR NEGATIVE NEGATIVE Final   Influenza B by PCR NEGATIVE NEGATIVE Final    Comment: (NOTE) The Xpert Xpress SARS-CoV-2/FLU/RSV plus assay is intended as an aid in the diagnosis of influenza from Nasopharyngeal swab specimens and should not be used as a sole basis for treatment. Nasal washings and aspirates are unacceptable for Xpert Xpress SARS-CoV-2/FLU/RSV testing.  Fact Sheet for Patients: BloggerCourse.com  Fact Sheet for Healthcare Providers: SeriousBroker.it  This test is not yet approved or cleared by the [place] and has been authorized  for detection and/or diagnosis of SARS-CoV-2 by FDA under an Emergency Use Authorization (EUA). This EUA will remain in effect (meaning this test can be used) for the duration of the COVID-19 declaration under Section 564(b)(1) of the Act, 21 U.S.C. section 360bbb-3(b)(1), unless the authorization is terminated or revoked.     Resp Syncytial Virus by PCR NEGATIVE NEGATIVE Final    Comment: (NOTE) Fact Sheet for Patients: BloggerCourse.com  Fact Sheet for Healthcare Providers: SeriousBroker.it  This test is not yet approved or cleared by the [place] FDA and has been authorized for detection and/or diagnosis of SARS-CoV-2 by FDA under an Emergency Use Authorization (EUA). This EUA will remain in effect (meaning this test can be used) for the duration of the COVID-19 declaration under Section 564(b)(1) of the Act,  21 U.S.C. section 360bbb-3(b)(1), unless the authorization is terminated or revoked.  Performed at [facility], [address] [place], [place] [address]   Respiratory (~20 pathogens) panel by PCR     Status: None   Collection Time: 05/06/23  6:18 AM   Specimen: Nasopharyngeal Swab; Respiratory  Result Value Ref Range Status   Adenovirus NOT DETECTED NOT DETECTED Final   Coronavirus 229E NOT DETECTED NOT DETECTED Final    Comment: (NOTE) The Coronavirus on the Respiratory Panel, DOES NOT test for the novel  Coronavirus (2019 nCoV)    Coronavirus HKU1 NOT DETECTED NOT DETECTED Final   Coronavirus NL63 NOT DETECTED NOT DETECTED Final   Coronavirus OC43 NOT DETECTED NOT DETECTED Final   Metapneumovirus NOT DETECTED NOT DETECTED Final   Rhinovirus / Enterovirus NOT DETECTED NOT DETECTED Final   Influenza A NOT DETECTED NOT DETECTED Final   Influenza B NOT DETECTED NOT DETECTED Final   Parainfluenza Virus 1 NOT DETECTED NOT DETECTED Final   Parainfluenza Virus 2 NOT DETECTED NOT DETECTED Final   Parainfluenza Virus 3 NOT DETECTED NOT DETECTED Final   Parainfluenza Virus 4 NOT DETECTED NOT DETECTED Final   Respiratory Syncytial Virus NOT DETECTED NOT DETECTED Final   Bordetella pertussis NOT DETECTED NOT DETECTED Final   Bordetella Parapertussis NOT DETECTED NOT DETECTED Final   Chlamydophila pneumoniae NOT DETECTED NOT DETECTED Final   Mycoplasma pneumoniae NOT DETECTED NOT DETECTED Final    Comment: Performed at [facility], [address], [place], [place] [address]     Radiology Studies: DG Chest Port 1 View Result Date: 05/06/2023 CLINICAL DATA:  Dyspnea EXAM: PORTABLE CHEST 1 VIEW COMPARISON:  05/04/2023 FINDINGS: Mild cardiomegaly. No focal airspace consolidation or pulmonary edema. No pleural effusion or pneumothorax. IMPRESSION: Mild cardiomegaly. Electronically Signed   By: Deatra Robinson M.D.   On: 05/06/2023 23:25   CT HEAD WO CONTRAST  ( ) Result Date: 05/06/2023 CLINICAL DATA:  Altered mental status EXAM: CT HEAD WITHOUT CONTRAST TECHNIQUE: Contiguous axial images were obtained from the base of the skull through the vertex without intravenous contrast. RADIATION DOSE REDUCTION: This exam was performed according to the departmental dose-optimization program which includes automated exposure control, adjustment of the mA and/or kV according to patient size and/or use of iterative reconstruction technique. COMPARISON:  None Available. 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 No mass, hemorrhage or extra-axial collection. Vascular: No hyperdense vessel or unexpected vascular calcification. Skull: The visualized skull base, calvarium and extracranial soft tissues are normal. Sinuses/Orbits: Right maxillary sinus retention cyst. Other: None. IMPRESSION: 1. No acute intracranial abnormality. 2. 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 Electronically Signed   By: Deatra Robinson M.D.   On: 05/06/2023 20:36     Pamella Pert, MD, PhD [facility]  Between 7 am - 7 pm I am available, please contact me via Amion (for emergencies) or Securechat (non urgent messages)  Between 7 pm - 7 am I am not available, please contact night coverage MD/APP via Amion

## 2023-05-07 NOTE — Progress Notes (Signed)
Abg sample obtained and sent to lab for processing.  Tasha in lab notified.

## 2023-05-07 NOTE — Progress Notes (Signed)
Patient seen, examined and chart reviewed  58 year old morbidly obese male who was transferred from [facility] for management of cardiogenic shock  He is on milrinone, will start low-dose Levophed Will stop D10W and insulin  Advanced heart failure team is following    Cheri Fowler, MD [facility] See Amion for pager If no response to pager, please call [phone] until 7pm After 7pm, Please call E-link [phone]

## 2023-05-07 NOTE — Progress Notes (Signed)
Peripherally Inserted Central Catheter Placement  The IV Nurse, Chari Manning, RN has discussed with the patient and/or persons authorized to consent for the patient, the purpose of this procedure and the potential benefits and risks involved with this procedure.  The benefits include less needle sticks, lab draws from the catheter, and the patient may be discharged home with the catheter. Risks include, but not limited to, infection, bleeding, blood clot (thrombus formation), and puncture of an artery; nerve damage and irregular heartbeat and possibility to perform a PICC exchange if needed/ordered by physician.  Alternatives to this procedure were also discussed.  Bard Power PICC patient education guide, fact sheet on infection prevention and patient information card has been provided to patient /or left at bedside.    PICC Placement Documentation  PICC Triple Lumen 05/07/23 Right Brachial 44 cm 0 cm (Active)  Indication for Insertion or Continuance of Line Vasoactive infusions 05/07/23 1349  Exposed Catheter (cm) 0 cm 05/07/23 1349  Site Assessment Clean, Dry, Intact 05/07/23 1349  Lumen #1 Status Saline locked;Blood return noted 05/07/23 1349  Lumen #2 Status Saline locked;Blood return noted 05/07/23 1349  Lumen #3 Status Saline locked;Blood return noted 05/07/23 1349  Dressing Type Transparent;Securing device 05/07/23 1349  Dressing Status Antimicrobial disc/dressing in place;Clean, Dry, Intact 05/07/23 1349  Line Care Connections checked and tightened 05/07/23 1349  Line Adjustment (NICU/IV Team Only) No 05/07/23 1349  Dressing Intervention New dressing;Adhesive placed at insertion site (IV team only) 05/07/23 1349  Dressing Change Due 05/14/23 05/07/23 1349       Dylan Owen 05/07/2023, 1:50 PM

## 2023-05-07 NOTE — Consult Note (Addendum)
NAME:  Dylan Owen, MRN:  [id number], DOB:  1965-07-07, LOS: 2 ADMISSION DATE:  05/04/2023, CONSULTATION DATE:  05/07/23 REFERRING MD:  Dr Geradine Girt, CHIEF COMPLAINT:  respiratory failure, fever, delirium. Cardiogenic shock possibly    History of Present Illness -from [facility] hospitalist and also from cardiology notes.   58 year old male with significant obesity, hypertension, hyperlipidemia, type 2 diabetes, sleep apnea, chronic back pain, depression, acid reflux.  Also depression significant home medications include Lasix, hydrochlorothiazide.  Lisinopril, metoprolol, nitroglycerin but also Robaxin and MS Contin 30 mg twice daily along with Repatha.  And Effexor.  Along with insulin for diabetes.  She he walks around with a cane at baseline.  He also admits to medication noncompliance.  And 10 pound weight gain he is not on oxygen at home.  His hemoglobin A1c was 10.2 at admission consistent with very poor compliance.  Admitted on 05/04/2023 with complaints of cough, sore throat, dizziness and bodyaches for 1 week.  Also included chest tightness headaches chills diaphoresis.  At the time of arrival to the ED also had dyspnea on exertion.  At admission BNP was 1275.  Respiratory virus panel was negative.  He was started on Lasix 60 twice daily and his home lisinopril.  He was initially hypertensive and with this management blood pressure improved.  Echocardiogram showed low ejection fraction approximately 25% although poor acoustic windows.  Chest x-ray suggestive of venous congestion.  Cardiology consultation 05/06/2023 noted net -1.6 L since admission and patient was given history of increased frequency of urination.  Diagnose of viral myocarditis was made and left a right heart catheterization was considered.  Troponin barely elevated and consistent with viral myocarditis.  Postadmission with diuresis as of 05/06/2023 creatinine rose up to 1.57 mg percent [baseline 1 mg percent].  At this point  lisinopril was held.  811/31/25 developed hypoglycemia requiring infusion of dextrose and transferred to the intensive care unit.  Post transfer to the intensive care unit he developed acute fever and also BiPAP dependency.  Along with worsening renal failure further to creatinine of 2.83 mg percent along with persistent lactic acidosis around 3.  And mild intermittent delirium.  Overnight diaphoresis reported but despite all this nursing consistently reports that his periphery is extremely cold.   Cardiology has been reconsulted.  Critical care medicine consulted.  Past Medical History:    has a past medical history of B12 deficiency, Blood transfusion without reported diagnosis, Chronic low back pain, Depression, Diabetes mellitus with complication (HCC), GERD (gastroesophageal reflux disease), Heart murmur, History of adenomatous polyp of colon (05/22/2021), Hypercholesterolemia, Hypertension, OSA (obstructive sleep apnea), Peripheral neuropathy, Persistent asthma, Seizures (HCC), Stable angina (HCC), Statin intolerance, and Subclinical hypothyroidism (06/2017).   reports that he has never smoked. His smokeless tobacco use includes chew.  Past Surgical History:  Procedure Laterality Date   COLONOSCOPY  05/22/2021   [facility]--recall 7 years   CYSTOSCOPY WITH RETROGRADE URETHROGRAM N/A 08/21/2021   Procedure: RETROGRADE URETHROGRAM;  Surgeon: Crist Fat, MD;  Location: [facility];  Service: Urology;  Laterality: N/A;   CYSTOSCOPY WITH URETHRAL DILATATION N/A 08/21/2021   Procedure: CYSTOSCOPY WITH URETHRAL DILATATION OPTILUME;  Surgeon: Crist Fat, MD;  Location: [facility];  Service: Urology;  Laterality: N/A;   LUMBAR SPINE SURGERY     lumbar laminectomy with hardware stabilization, with ensuing arachnoiditis   URETHRAL STRICTURE DILATATION  1996 and 2004.   Laser surgery.    Allergies  Allergen Reactions   Ace Inhibitors  Kidney failure   Atorvastatin Other (See Comments)     Arm pain   Prednisone     REACTION: Anxiety   Septra [Sulfamethoxazole-Trimethoprim]    Metformin And Related Diarrhea    Immunization History  Administered Date(s) Administered   Influenza Inj Mdck Quad Pf 01/02/2018   Influenza Split 02/08/2012   Influenza Whole 02/08/2008, 01/02/2009, 01/28/2010   Influenza,inj,Quad PF,6+ Mos 12/19/2013, 03/04/2016, 06/07/2017   Influenza-Unspecified 03/07/2013, 02/03/2018, 03/22/2021   PFIZER(Purple Top)SARS-COV-2 Vaccination 08/28/2019, 09/14/2019   PNEUMOCOCCAL CONJUGATE-20 09/12/2020   Pneumococcal Polysaccharide-23 09/27/2008, 03/04/2016   Tdap 12/19/2013   Zoster Recombinant(Shingrix) 09/12/2020, 12/25/2020    Family History  Problem Relation Age of Onset   Cancer Paternal Grandfather        prostate   Heart disease Other    Hypertension Other    Other Other        Emotional Illness   Colon cancer Neg Hx    Esophageal cancer Neg Hx    Rectal cancer Neg Hx    Stomach cancer Neg Hx      Current Facility-Administered Medications:    acetaminophen (TYLENOL) tablet 650 mg, 650 mg, Oral, Q6H PRN, 650 mg at 05/06/23 2105 **OR** acetaminophen (TYLENOL) suppository 650 mg, 650 mg, Rectal, Q6H PRN, Eduard Clos, MD   albuterol (PROVENTIL) (2.5 MG/3ML) 0.083% nebulizer solution 2.5 mg, 2.5 mg, Inhalation, Q6H PRN, Eduard Clos, MD   aspirin chewable tablet 81 mg, 81 mg, Oral, Daily, Jens Som, Madolyn Frieze, MD, 81 mg at 05/06/23 1436   ceFEPIme (MAXIPIME) 2 g in sodium chloride 0.9 % 100 mL IVPB, 2 g, Intravenous, Q12H, Lilliston, Loleta Rose, RPH   Chlorhexidine Gluconate Cloth 2 % PADS 6 each, 6 each, Topical, Daily, Gherghe, Daylene Katayama, MD   dextrose 10 % infusion, , Intravenous, Continuous, Anthoney Harada, NP, Last Rate: 75 mL/hr at 05/07/23 0507, Infusion Verify at 05/07/23 0507   dextrose 50 % solution, , , ,    empagliflozin (JARDIANCE) tablet 10 mg, 10 mg, Oral, Daily, Crenshaw, Madolyn Frieze, MD   enoxaparin (LOVENOX) injection  70 mg, 70 mg, Subcutaneous, Q24H, Eduard Clos, MD, 70 mg at 05/06/23 1610   furosemide (LASIX) injection 60 mg, 60 mg, Intravenous, BID, Parcells, Taylor A, PA-C   insulin aspart (novoLOG) injection 0-9 Units, 0-9 Units, Subcutaneous, TID WC, Eduard Clos, MD, 1 Units at 05/06/23 0753   insulin glargine-yfgn (SEMGLEE) injection 10 Units, 10 Units, Subcutaneous, Daily, Gherghe, Daylene Katayama, MD   liver oil-zinc oxide (DESITIN) 40 % ointment, , Topical, TID PRN, Anthoney Harada, NP   methocarbamol (ROBAXIN) tablet 500 mg, 500 mg, Oral, BID PRN, Eduard Clos, MD   metoprolol succinate (TOPROL-XL) 24 hr tablet 25 mg, 25 mg, Oral, Daily, Crenshaw, Madolyn Frieze, MD   metroNIDAZOLE (FLAGYL) IVPB 500 mg, 500 mg, Intravenous, Q12H, Anthoney Harada, NP, Stopped at 05/07/23 0206   morphine (MS CONTIN) 12 hr tablet 60 mg, 60 mg, Oral, BID, 60 mg at 05/06/23 2105 **AND** morphine (MS CONTIN) 12 hr tablet 30 mg, 30 mg, Oral, Q24H, Eduard Clos, MD, 30 mg at 05/06/23 1225   nitroGLYCERIN (NITROSTAT) SL tablet 0.4 mg, 0.4 mg, Sublingual, Q5 min PRN, Eduard Clos, MD   Oral care mouth rinse, 15 mL, Mouth Rinse, PRN, Gherghe, Costin M, MD   pantoprazole (PROTONIX) EC tablet 40 mg, 40 mg, Oral, Daily, Eduard Clos, MD, 40 mg at 05/06/23 0905   potassium chloride SA (KLOR-CON M) CR tablet 40 mEq, 40 mEq,  Oral, Daily, Joseph Art, DO, 40 mEq at 05/06/23 2440   spironolactone (ALDACTONE) tablet 12.5 mg, 12.5 mg, Oral, Daily, Lewayne Bunting, MD, 12.5 mg at 05/06/23 1436   venlafaxine XR (EFFEXOR-XR) 24 hr capsule 150 mg, 150 mg, Oral, BID, Eduard Clos, MD, 150 mg at 05/06/23 2105     Significant Hospital Events:  05/04/2023 - admit.  Start diuresis.  No bleeding  05/05/2023: Echocardiogram ejection fraction probably around 25% .  119   05/06/2023: Cardiology consultation.  Plan is for heart cath.  Concern is viral myocarditis.  No later decompensated with  hypoglycemia requiring dextrose infusion and transferred to ICU. Mild acute kidney injury with creatinine 1.5 mg percent.  Lisinopril stopped     05/07/2023: Development.  Of SIRS with fever and mild delirium.  Requiring BiPAP.  Peripheries are all cold.  Creat wose 2.8mg %. Shock liver.    Objective   Blood pressure (!) 93/58, pulse 63, temperature (!) 102 F (38.9 C), temperature source Rectal, resp. rate 16, height 6\' 1"  (1.854 m), weight (!) 145.7 kg, SpO2 99%.    FiO2 (%):  [35 %-100 %] 35 %   Intake/Output Summary (Last 24 hours) at 05/07/2023 0716 Last data filed at 05/07/2023 0531 Gross per 24 hour  Intake 1019.34 ml  Output 0 ml  Net 1019.34 ml   Filed Weights   05/06/23 0500 05/06/23 2000 05/07/23 0500  Weight: (!) 146.1 kg (!) 145.6 kg (!) 145.7 kg    Examination: General: Obese. On BiPAP HENT: BEARD. MOANS Lungs: Tachypneic but NOT PARADOXICAl Cardiovascular: Nroma heart sounds. Distant. Normal BP Abdomen: Obese, distant Extremities: Excotriated skin esp at tooes but OVERALL AL COLD Neuro: Moans, respons to simple commands. Not agiated GU: x  Resolved Hospital Problem list   x  Assessment & Plan:   Baseline: Obesity with obstructive sleep apnea noncompliant with CPAP.  Not on home oxygen.  Current: Acute hypoxemic and hypercapnic respiratory failure -developed 05/06/2023 , likely due to heart failure.  Unclear if there is any bacterial pneumonia  05/07/2023 -> currently on BiPAP and protecting airway  P:   Recheck ABG Continue BiPAP Head of bed elevated Intubated worsens    Chronic pain: On heavy dose oral MS Contin 30 twice daily at home along with Robaxin Depression on Effexor  Acute: New onset delirium 05/07/2023 with fever mild.  Not agitated. Possibly apharisc with fever an dMRI ordered but hx unclear    P:   Monitor clinically ( at high risk for opioid withdrawal] Consider fentanyl as needed instead of oral morphine given respiratory  distress Precedex if needed  Cancel MRI Brain for now given instability       History of hypertension on lisinopril metoprolol and hydrochlorothiazide and Lasix At high risk for developing circulatory shock either due to sepsis or cardiogenic or both    P:  Mean artery pressure goal greater than 65 Hold antihypertensives   Appears to have new onset viral myocarditis with ejection fraction 25% -this admission   - 05/07/2023 likely has developed cardiogenic shock  P: PICC line ordered Milrinone ordered [seen by Dr. Elberta Fortis      New onset fever 05/06/2023 -unclear source; multiplex respiratory respiratory negative    P:   Check procalcitonin Await blood culture Check urine culture Check urine Legionella and strep  Cefepime 05/06/2023 > 21/25 (delirium) /; Zosyn Flagyl 05/06/2023 >> 05/07/2023       Baseline creatinine 0.8 mg percent New onset acute kidney injury on 05/06/2023  creatinine 1.57 mg percent  05/07/2023 -acute kidney injury worse to 2.18 mg percent.  Mixed cardiogenic and septic  P:  PICC line with SCV O2 measurement Monitor with milrinone Avoid nephrotoxins   Lactic acidosis likely due to sepsis and cardiogenic    P Monitor with milrinone and antibiotics      History of GERD on PPI at home CT abodmen ordered 05/06/23 by [facility] team  - unclear indication   P:   N.p.o. except meds but if mental status worse than full n.p.o. DC CT ABD for now given instaibliuty    Mild polycythemia but at high risk for anemia of critical illness   P:  - PRBC for hgb </= 6.9gm%    - exceptions are   -  if ACS susepcted/confirmed then transfuse for hgb </= 8.0gm%,  or    -  active bleeding with hemodynamic instability, then transfuse regardless of hemoglobin value   At at all times try to transfuse 1 unit prbc as possible with exception of active hemorrhage     Poorly controlled diabetes at baseline Hypoglycemia requiring dextrose infusion since  05/06/2023    P:   SSI D10 to continue DC JARDIANCE for now  -Hypoglycemia monitoring  MSK/DERM No sacral decub but obese with dry skin    Best practice (daily eval):  Diet: N.p.o. except meds Pain/Anxiety/Delirium protocol (if indicated): Chronic pain on MS Contin at home.  Monitor for withdrawal.  Continue MS Contin till he can tolerate orally but then also IV fentanyl VAP protocol (if indicated): x DVT prophylaxis: Lovenox GI prophylaxis: PPI Glucose control: SSI Mobility: Bedrest Code Status: Full code Family Communication: Wife [name] at the bedside 05/07/2023 Disposition: PCCM primary 05/07/2023.  Moved from [facility] ICU to [facility], ICU -.  Heart failure services Dr. Clearnce Hasten will consult   Discussed with Dr. Elberta Fortis at the bedside and also hospitalist Dr. Elinor Parkinson an Eleonore Chiquito of [facility] at [facility]  ATTESTATION & SIGNATURE   The patient HARLEM BULA is critically ill with multiple organ systems failure and requires high complexity decision making for assessment and support, frequent evaluation and titration of therapies, application of advanced monitoring technologies and extensive interpretation of multiple databases.   Critical Care Time devoted to patient care services described in this note is  75   Minutes. This time reflects time of care of this signee Dr Kalman Shan. This critical care time does not reflect procedure time, or teaching time or supervisory time of PA/NP/Med student/Med Resident etc but could involve care discussion time      SIGNATURE    Dr. Kalman Shan, M.D., F.C.C.P,  Pulmonary and Critical Care Medicine Staff Physician, [facility] Director - Interstitial Lung Disease  Program  Pulmonary Fibrosis [facility] at [place], [place], [address]  NPI Number:  NPI #[id number]  Pager: [phone], If no answer  -> Check AMION or Try [phone] Telephone (clinical office): [phone] Telephone (research): [phone]  7:16 AM 05/07/2023   05/07/2023 7:16 AM    LABS    PULMONARY Recent Labs  Lab 05/06/23 2037 05/07/23 0215  PHART  --  7.3*  PCO2ART  --  56*  PO2ART  --  123*  HCO3 32.0* 26.9  O2SAT 28.4 100    CBC Recent Labs  Lab 05/06/23 0504 05/06/23 2226 05/07/23 0059  HGB 16.1 16.8 17.3*  HCT 53.3* 57.6* 58.8*  WBC 9.9 12.8* 13.4*  PLT 272 316 291    COAGULATION No results for input(s): "INR" in the last 168 hours.  CARDIAC  No results for input(s): "TROPONINI" in the last 168 hours. No results for input(s): "PROBNP" in the last 168 hours.  CHEMISTRY Recent Labs  Lab 05/05/23 1610 05/06/23 0504 05/06/23 2037 05/06/23 2226 05/07/23 0059  NA 138 137 139 138 138  K 3.2* 4.0 4.5 4.7 4.7  CL 99 96* 97* 95* 99  CO2 28 27 24 25  21*  GLUCOSE 104* 86 99 68* 100*  BUN 18 21* 26* 27* 28*  CREATININE 1.17 1.57* 2.45* 2.82* 2.83*  CALCIUM 8.7* 8.9 8.9 9.0 9.2  MG 1.7  --   --   --  2.1   Estimated Creatinine Clearance: 43.3 mL/min (A) (by C-G formula based on SCr of 2.83 mg/dL (H)).   LIVER Recent Labs  Lab 05/04/23 1645 05/05/23 0633 05/06/23 2226 05/07/23 0059  AST 21 22 625* 1,258*  ALT 12 11 252* 454*  ALKPHOS 70 67 70 72  BILITOT 2.0* 1.9* 3.1* 3.2*  PROT 7.4 7.0 7.6 7.5  ALBUMIN 3.5 3.2* 3.4* 3.6     INFECTIOUS Recent Labs  Lab 05/06/23 2226 05/07/23 0059 05/07/23 0527  LATICACIDVEN 3.7* 3.8* 3.3*  PROCALCITON 0.21  --   --      ENDOCRINE CBG (last 3)  Recent Labs    05/07/23 0259 05/07/23 0415 05/07/23 0526  GLUCAP 92 84 92       Latest Reference Range & Units 05/05/23 02:30 05/05/23 03:46 05/06/23 20:37 05/07/23 00:59  Troponin I (High Sensitivity) <18 ng/L 30 (H) 27 (H) 38 (H) 109 (HH)  (HH): Data is critically high (H): Data is abnormally high   IMAGING x48h  - image(s) personally visualized  -   highlighted in bold DG Chest Port 1 View Result Date: 05/06/2023 CLINICAL DATA:   Dyspnea EXAM: PORTABLE CHEST 1 VIEW COMPARISON:  05/04/2023 FINDINGS: Mild cardiomegaly. No focal airspace consolidation or pulmonary edema. No pleural effusion or pneumothorax. IMPRESSION: Mild cardiomegaly. Electronically Signed   By: Deatra Robinson M.D.   On: 05/06/2023 23:25   CT HEAD WO CONTRAST ( ) Result Date: 05/06/2023 CLINICAL DATA:  Altered mental status EXAM: CT HEAD WITHOUT CONTRAST TECHNIQUE: Contiguous axial images were obtained from the base of the skull through the vertex without intravenous contrast. RADIATION DOSE REDUCTION: This exam was performed according to the departmental dose-optimization program which includes automated exposure control, adjustment of the mA and/or kV according to patient size and/or use of iterative reconstruction technique. COMPARISON:  None Available. 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 No mass, hemorrhage or extra-axial collection. Vascular: No hyperdense vessel or unexpected vascular calcification. Skull: The visualized skull base, calvarium and extracranial soft tissues are normal. Sinuses/Orbits: Right maxillary sinus retention cyst. Other: None. IMPRESSION: 1. No acute intracranial abnormality. 2. 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 Electronically Signed   By: Deatra Robinson M.D.   On: 05/06/2023 20:36   ECHOCARDIOGRAM COMPLETE Result Date: 05/05/2023    ECHOCARDIOGRAM REPORT   Patient Name:   RASHIDI [facility] Date of Exam: 05/05/2023 Medical Rec #:  [id number]         Height:       73.0 in Accession #:    [id number]        Weight:       328.0 lb Date of Birth:  1965/05/05  BSA:          2.655 m Patient Age:    58 years          BP:           171/97 mmHg Patient Gender: M                 HR:           85 bpm. Exam Location:  Inpatient Procedure: 2D Echo, Color Doppler, Cardiac Doppler and Intracardiac            Opacification Agent  Indications:    Congestive Heart Failure I50.9  History:        Patient has no prior history of Echocardiogram examinations.                 CHF; Risk Factors:Hypertension, Diabetes and HLD.  Sonographer:    Webb Laws Referring Phys: [id number] ARSHAD N KAKRAKANDY IMPRESSIONS  1. Cannot accurately estimate EF despite addition fo Definity contrast due to forshortened views and shadowing. The left ventricle demonstrates regional wall motion abnormalities (see scoring diagram/findings for description). There is hypokinesis of the apex and mid to distal anterior wall and anteroseptum.There is mild asymmetric left ventricular hypertrophy. Left ventricular diastolic parameters are indeterminate. Elevated left ventricular end-diastolic pressure.  2. Right ventricular systolic function is normal. The right ventricular size is normal. There is mildly elevated pulmonary artery systolic pressure. The estimated right ventricular systolic pressure is 39.4 mmHg.  3. The mitral valve is normal in structure. Mild mitral valve regurgitation. No evidence of mitral stenosis.  4. The aortic valve is normal in structure. Aortic valve regurgitation is not visualized. No aortic stenosis is present.  5. The inferior vena cava is dilated in size with <50% respiratory variability, suggesting right atrial pressure of 15 mmHg.  6. Consider repeat limited echo vs cardiac MRI for further assessment of EF FINDINGS  Left Ventricle: Cannot accurately estimate EF despite definity contrast due to poor image quality, forshortened views and shadowing. The left ventricle demonstrates regional wall motion abnormalities. Definity contrast agent was given IV to delineate the left ventricular endocardial borders. The left ventricular internal cavity size was normal in size. There is mild asymmetric left ventricular hypertrophy. Left ventricular diastolic parameters are indeterminate. Elevated left ventricular end-diastolic pressure. Right Ventricle: The  right ventricular size is normal. No increase in right ventricular wall thickness. Right ventricular systolic function is normal. There is mildly elevated pulmonary artery systolic pressure. The tricuspid regurgitant velocity is 2.47  m/s, and with an assumed right atrial pressure of 15 mmHg, the estimated right ventricular systolic pressure is 39.4 mmHg. Left Atrium: Left atrial size was normal in size. Right Atrium: Right atrial size was normal in size. Pericardium: There is no evidence of pericardial effusion. Mitral Valve: The mitral valve is normal in structure. Mild mitral valve regurgitation. No evidence of mitral valve stenosis. Tricuspid Valve: The tricuspid valve is normal in structure. Tricuspid valve regurgitation is mild . No evidence of tricuspid stenosis. Aortic Valve: The aortic valve is normal in structure. Aortic valve regurgitation is not visualized. No aortic stenosis is present. Pulmonic Valve: The pulmonic valve was normal in structure. Pulmonic valve regurgitation is mild. No evidence of pulmonic stenosis. Aorta: The aortic root is normal in size and structure. Venous: The inferior vena cava is dilated in size with less than 50% respiratory variability, suggesting right atrial pressure of 15 mmHg. IAS/Shunts: No atrial level shunt detected by color flow Doppler.  LEFT  VENTRICLE PLAX 2D LVIDd:         5.60 cm      Diastology LVIDs:         4.30 cm      LV e' medial:    4.24 cm/s LV PW:         1.20 cm      LV E/e' medial:  21.5 LV IVS:        1.20 cm      LV e' lateral:   6.31 cm/s LVOT diam:     2.00 cm      LV E/e' lateral: 14.5 LV SV:         27 LV SV Index:   10 LVOT Area:     3.14 cm  LV Volumes (MOD) LV vol d, MOD A2C: 162.0 ml LV vol d, MOD A4C: 183.0 ml LV vol s, MOD A2C: 127.0 ml LV vol s, MOD A4C: 114.0 ml LV SV MOD A2C:     35.0 ml LV SV MOD A4C:     183.0 ml LV SV MOD BP:      56.9 ml RIGHT VENTRICLE            IVC RV S prime:     7.07 cm/s  IVC diam: 3.30 cm TAPSE (M-mode): 2.2 cm  LEFT ATRIUM             Index        RIGHT ATRIUM           Index LA diam:        4.40 cm 1.66 cm/m   RA Area:     19.40 cm LA Vol (A2C):   38.0 ml 14.31 ml/m  RA Volume:   55.20 ml  20.79 ml/m LA Vol (A4C):   41.7 ml 15.71 ml/m LA Biplane Vol: 40.9 ml 15.40 ml/m  AORTIC VALVE             PULMONIC VALVE LVOT Vmax:   57.50 cm/s  PR End Diast Vel: 1.70 msec LVOT Vmean:  40.900 cm/s LVOT VTI:    0.086 m  AORTA Ao Root diam: 3.40 cm Ao Asc diam:  3.30 cm MITRAL VALVE               TRICUSPID VALVE MV Area (PHT): 9.37 cm    TR Peak grad:   24.4 mmHg MV Decel Time: 81 msec     TR Vmax:        247.00 cm/s MV E velocity: 91.30 cm/s MV A velocity: 59.10 cm/s  SHUNTS MV E/A ratio:  1.54        Systemic VTI:  0.09 m                            Systemic Diam: 2.00 cm Armanda Magic MD Electronically signed by Armanda Magic MD Signature Date/Time: 05/05/2023/1:20:27 PM    Final    VAS [place] LOWER EXTREMITY VENOUS (DVT) Result Date: 05/05/2023  Lower Venous DVT Study Patient Name:  AUL MANGIERI [facility]  Date of Exam:   05/05/2023 Medical Rec #: [id number]          Accession #:    [id number] Date of Birth: 12/25/1965          Patient Gender: M Patient Age:   47 years [facility]am [facility] Procedure:      VAS [place] LOWER EXTREMITY VENOUS (DVT) Referring Phys: Midge Minium --------------------------------------------------------------------------------  Indications: Edema.  Risk Factors: Obesity. Limitations: Body habitus and bandages. Comparison Study: None. Performing Technologist: Shona Simpson  Examination Guidelines: A complete evaluation includes B-mode imaging, spectral Doppler, color Doppler, and power Doppler as needed of all accessible portions of each vessel. Bilateral testing is considered an integral part of a complete examination. Limited examinations for reoccurring indications may be performed as noted. The reflux portion of the exam is performed with the patient in reverse Trendelenburg.   +---------+---------------+---------+-----------+----------+-------------------+ RIGHT    CompressibilityPhasicitySpontaneityPropertiesThrombus Aging      +---------+---------------+---------+-----------+----------+-------------------+ CFV      Full           Yes      Yes                                      +---------+---------------+---------+-----------+----------+-------------------+ SFJ      Full                                                             +---------+---------------+---------+-----------+----------+-------------------+ FV Prox  Full                                                             +---------+---------------+---------+-----------+----------+-------------------+ FV Mid   Full                                                             +---------+---------------+---------+-----------+----------+-------------------+ FV DistalFull                                         Not well visualized +---------+---------------+---------+-----------+----------+-------------------+ PFV      Full                                                             +---------+---------------+---------+-----------+----------+-------------------+ POP      Full           Yes      Yes                                      +---------+---------------+---------+-----------+----------+-------------------+ PTV      Full                                                             +---------+---------------+---------+-----------+----------+-------------------+ PERO  Full                                         Not well visualized +---------+---------------+---------+-----------+----------+-------------------+   +---------+---------------+---------+-----------+----------+-------------------+ LEFT     CompressibilityPhasicitySpontaneityPropertiesThrombus Aging      +---------+---------------+---------+-----------+----------+-------------------+  CFV      Full           Yes      Yes                                      +---------+---------------+---------+-----------+----------+-------------------+ SFJ      Full                                                             +---------+---------------+---------+-----------+----------+-------------------+ FV Prox  Full                                                             +---------+---------------+---------+-----------+----------+-------------------+ FV Mid   Full                                                             +---------+---------------+---------+-----------+----------+-------------------+ FV DistalFull                                         Not well visualized +---------+---------------+---------+-----------+----------+-------------------+ PFV      Full                                                             +---------+---------------+---------+-----------+----------+-------------------+ POP      Full           Yes      Yes                                      +---------+---------------+---------+-----------+----------+-------------------+ PTV      Full                                                             +---------+---------------+---------+-----------+----------+-------------------+ PERO                             Yes  Not well visualized +---------+---------------+---------+-----------+----------+-------------------+     Summary: BILATERAL: - No evidence of deep vein thrombosis seen in the lower extremities, bilaterally. -No evidence of popliteal cyst, bilaterally.   *See table(s) above for measurements and observations. Electronically signed by Sherald Hess MD on 05/05/2023 at 11:32:53 AM.    Final

## 2023-05-07 NOTE — Progress Notes (Signed)
Rounding Note    Patient Name: Dylan Owen Date of Encounter: 05/07/2023  [facility] Cardiologist: None   Subjective   Rapid response called overnight due to change in responsiveness of the patient.  Had increased confusion.  Was found to be hypoglycemic and now on dextrose.  Continuing to be confused.  Is febrile with acute liver and kidney injury.  On BiPAP.  Inpatient Medications    Scheduled Meds:  aspirin  81 mg Oral Daily   Chlorhexidine Gluconate Cloth  6 each Topical Daily   empagliflozin  10 mg Oral Daily   enoxaparin (LOVENOX) injection  70 mg Subcutaneous Q24H   furosemide  60 mg Intravenous BID   insulin aspart  0-9 Units Subcutaneous TID WC   insulin glargine-yfgn  10 Units Subcutaneous Daily   lidocaine  1 Application Urethral Once   metoprolol succinate  25 mg Oral Daily   morphine  60 mg Oral BID   And   morphine  30 mg Oral Q24H   pantoprazole  40 mg Oral Daily   potassium chloride  40 mEq Oral Daily   spironolactone  12.5 mg Oral Daily   venlafaxine XR  150 mg Oral BID   Continuous Infusions:  ceFEPime (MAXIPIME) IV     dextrose 75 mL/hr at 05/07/23 0507   metronidazole Stopped (05/07/23 0206)   PRN Meds: acetaminophen **OR** acetaminophen, albuterol, liver oil-zinc oxide, methocarbamol, nitroGLYCERIN, mouth rinse   Vital Signs    Vitals:   05/07/23 0325 05/07/23 0400 05/07/23 0500 05/07/23 0600  BP: (!) 111/93 (!) 99/51 (!) 113/93 (!) 93/58  Pulse: 65 64 74 63  Resp: (!) 22 (!) 23 (!) 21 16  Temp: (!) 102.4 F (39.1 C) (!) 102.4 F (39.1 C) (!) 102.2 F (39 C) (!) 102 F (38.9 C)  TempSrc:  Rectal  Rectal  SpO2: 97% 94% 96% 99%  Weight:   (!) 145.7 kg   Height:        Intake/Output Summary (Last 24 hours) at 05/07/2023 0758 Last data filed at 05/07/2023 0531 Gross per 24 hour  Intake 1019.34 ml  Output 0 ml  Net 1019.34 ml      05/07/2023    5:00 AM 05/06/2023    8:00 PM 05/06/2023    5:00 AM  Last 3 Weights  Weight  (lbs) 321 lb 3.4 oz 320 lb 15.8 oz 322 lb 1.6 oz  Weight (kg) 145.7 kg 145.6 kg 146.104 kg      Telemetry    Sinus rhythm- Personally Reviewed  ECG    Sinus rhythm, PVC, lateral Q waves- Personally Reviewed  Physical Exam   GEN: No acute distress.   Neck: No JVD Cardiac: RRR, no murmurs, rubs, or gallops.  Respiratory: Clear to auscultation bilaterally. GI: Soft, nontender, non-distended  MS: Cold extremities with 2+ edema Neuro:  Nonfocal  Psych: Normal affect   Labs    High Sensitivity Troponin:   Recent Labs  Lab 05/05/23 0230 05/05/23 0346 05/06/23 2037 05/07/23 0059  TROPONINIHS 30* 27* 38* 109*     Chemistry Recent Labs  Lab 05/05/23 0981 05/06/23 0504 05/06/23 2037 05/06/23 2226 05/07/23 0059  NA 138   < > 139 138 138  K 3.2*   < > 4.5 4.7 4.7  CL 99   < > 97* 95* 99  CO2 28   < > 24 25 21*  GLUCOSE 104*   < > 99 68* 100*  BUN 18   < >  26* 27* 28*  CREATININE 1.17   < > 2.45* 2.82* 2.83*  CALCIUM 8.7*   < > 8.9 9.0 9.2  MG 1.7  --   --   --  2.1  PROT 7.0  --   --  7.6 7.5  ALBUMIN 3.2*  --   --  3.4* 3.6  AST 22  --   --  625* 1,258*  ALT 11  --   --  252* 454*  ALKPHOS 67  --   --  70 72  BILITOT 1.9*  --   --  3.1* 3.2*  GFRNONAA >60   < > 30* 25* 25*  ANIONGAP 11   < > 18* 18* 18*   < > = values in this interval not displayed.    Lipids  Recent Labs  Lab 05/07/23 0059  CHOL 120  TRIG 74  HDL 21*  LDLCALC 84  CHOLHDL 5.7    Hematology Recent Labs  Lab 05/06/23 0504 05/06/23 2226 05/07/23 0059  WBC 9.9 12.8* 13.4*  RBC 5.99* 6.43* 6.56*  HGB 16.1 16.8 17.3*  HCT 53.3* 57.6* 58.8*  MCV 89.0 89.6 89.6  MCH 26.9 26.1 26.4  MCHC 30.2 29.2* 29.4*  RDW 16.9* 17.7* 17.9*  PLT 272 316 291   Thyroid  Recent Labs  Lab 05/05/23 0619  TSH 1.853    BNP Recent Labs  Lab 05/04/23 1645  BNP 1,275.2*    DDimer No results for input(s): "DDIMER" in the last 168 hours.   Radiology    DG Chest Port 1 View Result Date:  05/06/2023 CLINICAL DATA:  Dyspnea EXAM: PORTABLE CHEST 1 VIEW COMPARISON:  05/04/2023 FINDINGS: Mild cardiomegaly. No focal airspace consolidation or pulmonary edema. No pleural effusion or pneumothorax. IMPRESSION: Mild cardiomegaly. Electronically Signed   By: Deatra Robinson M.D.   On: 05/06/2023 23:25   CT HEAD WO CONTRAST ( ) Result Date: 05/06/2023 CLINICAL DATA:  Altered mental status EXAM: CT HEAD WITHOUT CONTRAST TECHNIQUE: Contiguous axial images were obtained from the base of the skull through the vertex without intravenous contrast. RADIATION DOSE REDUCTION: This exam was performed according to the departmental dose-optimization program which includes automated exposure control, adjustment of the mA and/or kV according to patient size and/or use of iterative reconstruction technique. COMPARISON:  None Available. 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 No mass, hemorrhage or extra-axial collection. Vascular: No hyperdense vessel or unexpected vascular calcification. Skull: The visualized skull base, calvarium and extracranial soft tissues are normal. Sinuses/Orbits: Right maxillary sinus retention cyst. Other: None. IMPRESSION: 1. No acute intracranial abnormality. 2. 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 Electronically Signed   By: Deatra Robinson M.D.   On: 05/06/2023 20:36   ECHOCARDIOGRAM COMPLETE Result Date: 05/05/2023    ECHOCARDIOGRAM REPORT   Patient Name:   Dylan Owen [facility] Date of Exam: 05/05/2023 Medical Rec #:  [id number]         Height:       73.0 in Accession #:    [id number]        Weight:       328.0 lb Date of Birth:  03/16/1966         BSA:          2.655 m Patient Age:    58 years          BP:  171/97 mmHg Patient Gender: M                 HR:           85 bpm. Exam Location:  Inpatient Procedure: 2D Echo, Color Doppler, Cardiac Doppler and Intracardiac             Opacification Agent Indications:    Congestive Heart Failure I50.9  History:        Patient has no prior history of Echocardiogram examinations.                 CHF; Risk Factors:Hypertension, Diabetes and HLD.  Sonographer:    Webb Laws Referring Phys: [id number] ARSHAD N KAKRAKANDY IMPRESSIONS  1. Cannot accurately estimate EF despite addition fo Definity contrast due to forshortened views and shadowing. The left ventricle demonstrates regional wall motion abnormalities (see scoring diagram/findings for description). There is hypokinesis of the apex and mid to distal anterior wall and anteroseptum.There is mild asymmetric left ventricular hypertrophy. Left ventricular diastolic parameters are indeterminate. Elevated left ventricular end-diastolic pressure.  2. Right ventricular systolic function is normal. The right ventricular size is normal. There is mildly elevated pulmonary artery systolic pressure. The estimated right ventricular systolic pressure is 39.4 mmHg.  3. The mitral valve is normal in structure. Mild mitral valve regurgitation. No evidence of mitral stenosis.  4. The aortic valve is normal in structure. Aortic valve regurgitation is not visualized. No aortic stenosis is present.  5. The inferior vena cava is dilated in size with <50% respiratory variability, suggesting right atrial pressure of 15 mmHg.  6. Consider repeat limited echo vs cardiac MRI for further assessment of EF FINDINGS  Left Ventricle: Cannot accurately estimate EF despite definity contrast due to poor image quality, forshortened views and shadowing. The left ventricle demonstrates regional wall motion abnormalities. Definity contrast agent was given IV to delineate the left ventricular endocardial borders. The left ventricular internal cavity size was normal in size. There is mild asymmetric left ventricular hypertrophy. Left ventricular diastolic parameters are indeterminate. Elevated left ventricular end-diastolic pressure.  Right Ventricle: The right ventricular size is normal. No increase in right ventricular wall thickness. Right ventricular systolic function is normal. There is mildly elevated pulmonary artery systolic pressure. The tricuspid regurgitant velocity is 2.47  m/s, and with an assumed right atrial pressure of 15 mmHg, the estimated right ventricular systolic pressure is 39.4 mmHg. Left Atrium: Left atrial size was normal in size. Right Atrium: Right atrial size was normal in size. Pericardium: There is no evidence of pericardial effusion. Mitral Valve: The mitral valve is normal in structure. Mild mitral valve regurgitation. No evidence of mitral valve stenosis. Tricuspid Valve: The tricuspid valve is normal in structure. Tricuspid valve regurgitation is mild . No evidence of tricuspid stenosis. Aortic Valve: The aortic valve is normal in structure. Aortic valve regurgitation is not visualized. No aortic stenosis is present. Pulmonic Valve: The pulmonic valve was normal in structure. Pulmonic valve regurgitation is mild. No evidence of pulmonic stenosis. Aorta: The aortic root is normal in size and structure. Venous: The inferior vena cava is dilated in size with less than 50% respiratory variability, suggesting right atrial pressure of 15 mmHg. IAS/Shunts: No atrial level shunt detected by color flow Doppler.  LEFT VENTRICLE PLAX 2D LVIDd:         5.60 cm      Diastology LVIDs:         4.30 cm      LV e' medial:  4.24 cm/s LV PW:         1.20 cm      LV E/e' medial:  21.5 LV IVS:        1.20 cm      LV e' lateral:   6.31 cm/s LVOT diam:     2.00 cm      LV E/e' lateral: 14.5 LV SV:         27 LV SV Index:   10 LVOT Area:     3.14 cm  LV Volumes (MOD) LV vol d, MOD A2C: 162.0 ml LV vol d, MOD A4C: 183.0 ml LV vol s, MOD A2C: 127.0 ml LV vol s, MOD A4C: 114.0 ml LV SV MOD A2C:     35.0 ml LV SV MOD A4C:     183.0 ml LV SV MOD BP:      56.9 ml RIGHT VENTRICLE            IVC RV S prime:     7.07 cm/s  IVC diam: 3.30 cm  TAPSE (M-mode): 2.2 cm LEFT ATRIUM             Index        RIGHT ATRIUM           Index LA diam:        4.40 cm 1.66 cm/m   RA Area:     19.40 cm LA Vol (A2C):   38.0 ml 14.31 ml/m  RA Volume:   55.20 ml  20.79 ml/m LA Vol (A4C):   41.7 ml 15.71 ml/m LA Biplane Vol: 40.9 ml 15.40 ml/m  AORTIC VALVE             PULMONIC VALVE LVOT Vmax:   57.50 cm/s  PR End Diast Vel: 1.70 msec LVOT Vmean:  40.900 cm/s LVOT VTI:    0.086 m  AORTA Ao Root diam: 3.40 cm Ao Asc diam:  3.30 cm MITRAL VALVE               TRICUSPID VALVE MV Area (PHT): 9.37 cm    TR Peak grad:   24.4 mmHg MV Decel Time: 81 msec     TR Vmax:        247.00 cm/s MV E velocity: 91.30 cm/s MV A velocity: 59.10 cm/s  SHUNTS MV E/A ratio:  1.54        Systemic VTI:  0.09 m                            Systemic Diam: 2.00 cm Armanda Magic MD Electronically signed by Armanda Magic MD Signature Date/Time: 05/05/2023/1:20:27 PM    Final    VAS [place] LOWER EXTREMITY VENOUS (DVT) Result Date: 05/05/2023  Lower Venous DVT Study Patient Name:  Dylan Owen [facility]  Date of Exam:   05/05/2023 Medical Rec #: [id number]          Accession #:    [id number] Date of Birth: 1965/05/24          Patient Gender: M Patient Age:   27 years Exam Location:  [facility] Procedure:      VAS [place] LOWER EXTREMITY VENOUS (DVT) Referring Phys: Midge Minium --------------------------------------------------------------------------------  Indications: Edema.  Risk Factors: Obesity. Limitations: Body habitus and bandages. Comparison Study: None. Performing Technologist: Shona Simpson  Examination Guidelines: A complete evaluation includes B-mode imaging, spectral Doppler, color Doppler, and power Doppler as needed of all accessible portions of each  vessel. Bilateral testing is considered an integral part of a complete examination. Limited examinations for reoccurring indications may be performed as noted. The reflux portion of the exam is performed with the patient in reverse  Trendelenburg.  +---------+---------------+---------+-----------+----------+-------------------+ RIGHT    CompressibilityPhasicitySpontaneityPropertiesThrombus Aging      +---------+---------------+---------+-----------+----------+-------------------+ CFV      Full           Yes      Yes                                      +---------+---------------+---------+-----------+----------+-------------------+ SFJ      Full                                                             +---------+---------------+---------+-----------+----------+-------------------+ FV Prox  Full                                                             +---------+---------------+---------+-----------+----------+-------------------+ FV Mid   Full                                                             +---------+---------------+---------+-----------+----------+-------------------+ FV DistalFull                                         Not well visualized +---------+---------------+---------+-----------+----------+-------------------+ PFV      Full                                                             +---------+---------------+---------+-----------+----------+-------------------+ POP      Full           Yes      Yes                                      +---------+---------------+---------+-----------+----------+-------------------+ PTV      Full                                                             +---------+---------------+---------+-----------+----------+-------------------+ PERO     Full  Not well visualized +---------+---------------+---------+-----------+----------+-------------------+   +---------+---------------+---------+-----------+----------+-------------------+ LEFT     CompressibilityPhasicitySpontaneityPropertiesThrombus Aging       +---------+---------------+---------+-----------+----------+-------------------+ CFV      Full           Yes      Yes                                      +---------+---------------+---------+-----------+----------+-------------------+ SFJ      Full                                                             +---------+---------------+---------+-----------+----------+-------------------+ FV Prox  Full                                                             +---------+---------------+---------+-----------+----------+-------------------+ FV Mid   Full                                                             +---------+---------------+---------+-----------+----------+-------------------+ FV DistalFull                                         Not well visualized +---------+---------------+---------+-----------+----------+-------------------+ PFV      Full                                                             +---------+---------------+---------+-----------+----------+-------------------+ POP      Full           Yes      Yes                                      +---------+---------------+---------+-----------+----------+-------------------+ PTV      Full                                                             +---------+---------------+---------+-----------+----------+-------------------+ PERO                             Yes                  Not well visualized +---------+---------------+---------+-----------+----------+-------------------+     Summary: BILATERAL: - No evidence of deep vein thrombosis seen in the lower extremities, bilaterally. -No evidence of popliteal  cyst, bilaterally.   *See table(s) above for measurements and observations. Electronically signed by Sherald Hess MD on 05/05/2023 at 11:32:53 AM.    Final     Cardiac Studies   TTE  1. Cannot accurately estimate EF despite addition fo Definity contrast  due to  forshortened views and shadowing. The left ventricle demonstrates  regional wall motion abnormalities (see scoring diagram/findings for  description). There is hypokinesis of  the apex and mid to distal anterior wall and anteroseptum.There is mild  asymmetric left ventricular hypertrophy. Left ventricular diastolic  parameters are indeterminate. Elevated left ventricular end-diastolic  pressure.   2. Right ventricular systolic function is normal. The right ventricular  size is normal. There is mildly elevated pulmonary artery systolic  pressure. The estimated right ventricular systolic pressure is 39.4 mmHg.   3. The mitral valve is normal in structure. Mild mitral valve  regurgitation. No evidence of mitral stenosis.   4. The aortic valve is normal in structure. Aortic valve regurgitation is  not visualized. No aortic stenosis is present.   5. The inferior vena cava is dilated in size with <50% respiratory  variability, suggesting right atrial pressure of 15 mmHg.   6. Consider repeat limited echo vs cardiac MRI for further assessment of  EF   Patient Profile     58 y.o. male presented to the hospital with shortness of breath, now febrile with heart failure  Assessment & Plan    1.  Cardiogenic shock: Patient is cold and wet.  Has signs of poor perfusion.  Cleofas Hudgins need continued diuresis as creatinine improves.  Ilo Beamon start milrinone 0.25 mg.  No signs of ischemia with low-level troponin.  No indication for acute catheterization.  Sreshta Cressler likely need right and left heart catheterization in the future.  Would continue BiPAP.  Arnette Driggs likely need central line for Co. ox monitoring.  Cristobal Advani transfer to [facility] for heart failure care.  2.  Acute renal failure: In the setting of diuresis as well as shock.  Plan as above.  3.  Elevated LFTs: Right upper quadrant ultrasound pending.  Potentially due to low output and shock.  4.  Possible septic shock: Patient is febrile with elevated white count.   Antibiotics per primary team.      For questions or updates, please contact [facility] Please consult [email or web address] for contact info under        Signed, Ericca Labra Jorja Loa, MD  05/07/2023, 7:58 AM

## 2023-05-07 NOTE — Progress Notes (Signed)
     Assessment prior to care linke transfer RN has question if patient needs intubation   S:  Variable  0 Points 1 Point 2 Points Total  Heart rate per minute  <90 beats 90-109 beats >110 beats 1  Respiratory  Rate per minute < 18 breaths 19-30 breaths  >30 breaths 1  Restlessness; nonpurposeful movements None  occas slight movement Frequent movement 0  Paradoxical breathing pattern: None  Present 0  Accessory muscle use: rise in clavicle during inspiration None Slight rise Pronounced rise 1  Grunting at end-expiration: guttural sound None  Present 2  Nasal flaring: involuntary movement of nares None  Present 0  Look of fear None  Eyes wide 2  Overall total out of 16    7    Respiratory Distress Observation Scale Journal of Palliative Medicine Vol. 13, Number 3, 2010    Currently on milriroone BiPAP 35% Easily arousable.  He is able to point out where his wife is and his sons.  He is able to name his wife and sons.  He is not agitated.  He is on 35% FiO2 on BiPAP.  Respiratory distress is as above.  Discussed with wife and also receiving physician Dr. Merrily Pew  Risk of intubation for transfer is riskier than the risk of transferring him on BiPAP with milrinone at this point given his cardiomyopathy and current respiratory status.  I explained this to nursing and also [facility].  Family informed that any transfer critically ill patient is risky involving cardiac death but at this point the safest decision is to transfer him on BiPAP as this with milrinone through peripheral IV.   15 minutes additional critical care time.   SIGNATURE    Dr. Kalman Shan, M.D., F.C.C.P,  Pulmonary and Critical Care Medicine Staff Physician, [facility] Director - Interstitial Lung Disease  Program  Pulmonary Fibrosis [facility] at [facility] [place], [place], [address]   Pager: [phone], If no answer  -> Check AMION or Try [phone] Telephone (clinical office): [phone] Telephone (research): [phone]  10:10 AM 05/07/2023

## 2023-05-07 NOTE — Progress Notes (Signed)
Pt transported on BIPAP from 2H05 to CT and back without event.

## 2023-05-07 NOTE — Progress Notes (Signed)
Notified Lab Elijah Birk) that ABG being sent for analysis- RN aware.

## 2023-05-07 NOTE — Progress Notes (Addendum)
   eLink Physician-Brief Progress Note Patient Name: Dylan Owen DOB: [birth year] MRN: [id number]   Date of Service  05/07/2023  HPI/Events of Note   Dylan Owen is a 58 y.o. male with a PMH of obesity, HTN, HLD, DM2, OSA who presents to the [facility] ICU with mixed septic and cardiogenic shock.   Notified that the pupils are less equal and no longer AO x 2. Ct Head brain reviewed  eICU Interventions  Repeat CT head ASAP.  Unclear, but may be waxing/waning mental status.  Unclear that intervention is available even if he has drastic intracranial abnormalities   2254 - No evidence of acute intracranial abnormality on CT examination.  Concern for dysphagia on bedside exam-hold nighttime p.o. meds including Effexor.  Remaining enteral meds are twice daily/daily and due at 9 AM.  Speech eval ordered.  9528 -ongoing confusion.  Multiple hypoglycemic episodes, no tube feeds or oral intake.  Start dextrose infusion.  Hold ABG for now  0528 -febrile to 102.2.  Received suppository earlier in the evening despite transaminitis that is uptrending.  Cultures and antibiotics in place.  Add on LFTs to morning labs.  Repeat suppository now.  Patient is refusing external cooling measures.     [name] 05/07/2023, 9:34 PM

## 2023-05-07 NOTE — Progress Notes (Signed)
PICC consent signed by wife.  Pt somnolent, difficult to arouse.  After shaking vigorously and calling name loudly, he opened his eyes and said okay for wife to sign.

## 2023-05-07 NOTE — Progress Notes (Addendum)
Pharmacy Antibiotic Note  Dylan Owen is a 58 y.o. male admitted on 05/04/2023 with  acute CHF .  Febrile on 1/31 and sepsis workup initiated along with cefepime and Flagyl added but patient experiencing delirium.  This morning, antibiotics changed to Zosyn per Pharmacy consult.  Plan: Zosyn 3.375 g IV x 1 over 30 minutes then Zosyn 3.375g IV q8h (4 hour infusion).  Height: 6\' 1"  (185.4 cm) Weight: (!) 145.7 kg (321 lb 3.4 oz) IBW/kg (Calculated) : 79.9  Temp (24hrs), Avg:101.6 F (38.7 C), Min:97.6 F (36.4 C), Max:102.6 F (39.2 C)  Recent Labs  Lab 05/04/23 1645 05/05/23 0346 05/05/23 1610 05/06/23 0504 05/06/23 2037 05/06/23 2226 05/07/23 0059 05/07/23 0527  WBC 9.3 9.4  --  9.9  --  12.8* 13.4*  --   CREATININE 1.11  --  1.17 1.57* 2.45* 2.82* 2.83*  --   LATICACIDVEN  --   --   --   --   --  3.7* 3.8* 3.3*    Estimated Creatinine Clearance: 43.3 mL/min (A) (by C-G formula based on SCr of 2.83 mg/dL (H)).    Allergies  Allergen Reactions   Ace Inhibitors     Kidney failure   Atorvastatin Other (See Comments)    Arm pain   Prednisone     REACTION: Anxiety   Septra [Sulfamethoxazole-Trimethoprim]    Metformin And Related Diarrhea    Antimicrobials this admission: 2/1 cefepime & Flagyl >> 2/1 2/1 Zosyn >>   Microbiology results: 1/31 BCx: sent 2/1 UCx: needs to be collected   Thank you for allowing pharmacy to be a part of this patient's care.  Selinda Eon, PharmD, BCPS Clinical Pharmacist [facility] 05/07/2023 8:48 AM

## 2023-05-08 ENCOUNTER — Inpatient Hospital Stay (HOSPITAL_COMMUNITY): Payer: PPO

## 2023-05-08 DIAGNOSIS — J9601 Acute respiratory failure with hypoxia: Secondary | ICD-10-CM

## 2023-05-08 DIAGNOSIS — R570 Cardiogenic shock: Secondary | ICD-10-CM | POA: Diagnosis not present

## 2023-05-08 DIAGNOSIS — I5021 Acute systolic (congestive) heart failure: Secondary | ICD-10-CM | POA: Diagnosis not present

## 2023-05-08 DIAGNOSIS — I509 Heart failure, unspecified: Secondary | ICD-10-CM

## 2023-05-08 DIAGNOSIS — R579 Shock, unspecified: Secondary | ICD-10-CM

## 2023-05-08 DIAGNOSIS — A419 Sepsis, unspecified organism: Secondary | ICD-10-CM

## 2023-05-08 DIAGNOSIS — N179 Acute kidney failure, unspecified: Secondary | ICD-10-CM

## 2023-05-08 DIAGNOSIS — R6521 Severe sepsis with septic shock: Secondary | ICD-10-CM

## 2023-05-08 LAB — AMMONIA: Ammonia: 40 umol/L — ABNORMAL HIGH (ref 9–35)

## 2023-05-08 LAB — HEPATIC FUNCTION PANEL
ALT: 695 U/L — ABNORMAL HIGH (ref 0–44)
ALT: 786 U/L — ABNORMAL HIGH (ref 0–44)
AST: 1792 U/L — ABNORMAL HIGH (ref 15–41)
AST: 1882 U/L — ABNORMAL HIGH (ref 15–41)
Albumin: 2.7 g/dL — ABNORMAL LOW (ref 3.5–5.0)
Albumin: 3 g/dL — ABNORMAL LOW (ref 3.5–5.0)
Alkaline Phosphatase: 66 U/L (ref 38–126)
Alkaline Phosphatase: 81 U/L (ref 38–126)
Bilirubin, Direct: 1.2 mg/dL — ABNORMAL HIGH (ref 0.0–0.2)
Bilirubin, Direct: 1.7 mg/dL — ABNORMAL HIGH (ref 0.0–0.2)
Indirect Bilirubin: 1.3 mg/dL — ABNORMAL HIGH (ref 0.3–0.9)
Indirect Bilirubin: 1.2 mg/dL — ABNORMAL HIGH (ref 0.3–0.9)
Total Bilirubin: 2.5 mg/dL — ABNORMAL HIGH (ref 0.0–1.2)
Total Bilirubin: 2.9 mg/dL — ABNORMAL HIGH (ref 0.0–1.2)
Total Protein: 7.6 g/dL (ref 6.5–8.1)
Total Protein: 7 g/dL (ref 6.5–8.1)

## 2023-05-08 LAB — RENAL FUNCTION PANEL
Albumin: 3 g/dL — ABNORMAL LOW (ref 3.5–5.0)
Anion gap: 17 — ABNORMAL HIGH (ref 5–15)
BUN: 46 mg/dL — ABNORMAL HIGH (ref 6–20)
CO2: 24 mmol/L (ref 22–32)
Calcium: 7.5 mg/dL — ABNORMAL LOW (ref 8.9–10.3)
Chloride: 97 mmol/L — ABNORMAL LOW (ref 98–111)
Creatinine, Ser: 5.22 mg/dL — ABNORMAL HIGH (ref 0.61–1.24)
GFR, Estimated: 12 mL/min — ABNORMAL LOW (ref >60)
Glucose, Bld: 105 mg/dL — ABNORMAL HIGH (ref 70–99)
Phosphorus: 5.5 mg/dL — ABNORMAL HIGH (ref 2.5–4.6)
Potassium: 4.5 mmol/L (ref 3.5–5.1)
Sodium: 138 mmol/L (ref 135–145)

## 2023-05-08 LAB — BASIC METABOLIC PANEL
Anion gap: 21 — ABNORMAL HIGH (ref 5–15)
BUN: 42 mg/dL — ABNORMAL HIGH (ref 6–20)
CO2: 23 mmol/L (ref 22–32)
Calcium: 6.6 mg/dL — ABNORMAL LOW (ref 8.9–10.3)
Chloride: 91 mmol/L — ABNORMAL LOW (ref 98–111)
Creatinine, Ser: 5.05 mg/dL — ABNORMAL HIGH (ref 0.61–1.24)
GFR, Estimated: 13 mL/min — ABNORMAL LOW (ref >60)
Glucose, Bld: 271 mg/dL — ABNORMAL HIGH (ref 70–99)
Potassium: 4.1 mmol/L (ref 3.5–5.1)
Sodium: 135 mmol/L (ref 135–145)

## 2023-05-08 LAB — POCT I-STAT 7, (LYTES, BLD GAS, ICA,H+H)
Acid-Base Excess: 1 mmol/L (ref 0.0–2.0)
Acid-Base Excess: 0 mmol/L (ref 0.0–2.0)
Bicarbonate: 27.9 mmol/L (ref 20.0–28.0)
Bicarbonate: 27.2 mmol/L (ref 20.0–28.0)
Calcium, Ion: 1.01 mmol/L — ABNORMAL LOW (ref 1.15–1.40)
Calcium, Ion: 0.99 mmol/L — ABNORMAL LOW (ref 1.15–1.40)
HCT: 44 % (ref 39.0–52.0)
HCT: 48 % (ref 39.0–52.0)
Hemoglobin: 15 g/dL (ref 13.0–17.0)
Hemoglobin: 16.3 g/dL (ref 13.0–17.0)
O2 Saturation: 95 %
O2 Saturation: 98 %
Patient temperature: 100.8
Patient temperature: 38.1
Potassium: 4.3 mmol/L (ref 3.5–5.1)
Potassium: 4.3 mmol/L (ref 3.5–5.1)
Sodium: 137 mmol/L (ref 135–145)
Sodium: 135 mmol/L (ref 135–145)
TCO2: 30 mmol/L (ref 22–32)
TCO2: 29 mmol/L (ref 22–32)
pCO2 arterial: 56.8 mm[Hg] — ABNORMAL HIGH (ref 32–48)
pCO2 arterial: 55.6 mm[Hg] — ABNORMAL HIGH (ref 32–48)
pH, Arterial: 7.306 — ABNORMAL LOW (ref 7.35–7.45)
pH, Arterial: 7.303 — ABNORMAL LOW (ref 7.35–7.45)
pO2, Arterial: 92 mm[Hg] (ref 83–108)
pO2, Arterial: 120 mm[Hg] — ABNORMAL HIGH (ref 83–108)

## 2023-05-08 LAB — PROTIME-INR
INR: 2.2 — ABNORMAL HIGH (ref 0.8–1.2)
Prothrombin Time: 24.4 s — ABNORMAL HIGH (ref 11.4–15.2)

## 2023-05-08 LAB — GLUCOSE, CAPILLARY
Glucose-Capillary: 39 mg/dL — CL (ref 70–99)
Glucose-Capillary: 48 mg/dL — ABNORMAL LOW (ref 70–99)
Glucose-Capillary: 49 mg/dL — ABNORMAL LOW (ref 70–99)
Glucose-Capillary: 67 mg/dL — ABNORMAL LOW (ref 70–99)
Glucose-Capillary: 100 mg/dL — ABNORMAL HIGH (ref 70–99)
Glucose-Capillary: 69 mg/dL — ABNORMAL LOW (ref 70–99)
Glucose-Capillary: 73 mg/dL (ref 70–99)
Glucose-Capillary: 63 mg/dL — ABNORMAL LOW (ref 70–99)
Glucose-Capillary: 83 mg/dL (ref 70–99)
Glucose-Capillary: 92 mg/dL (ref 70–99)
Glucose-Capillary: 116 mg/dL — ABNORMAL HIGH (ref 70–99)

## 2023-05-08 LAB — URINE CULTURE

## 2023-05-08 LAB — PROCALCITONIN: Procalcitonin: 1.09 ng/mL

## 2023-05-08 LAB — COOXEMETRY PANEL
Carboxyhemoglobin: 1.8 % — ABNORMAL HIGH (ref 0.5–1.5)
Methemoglobin: 0.7 % (ref 0.0–1.5)
O2 Saturation: 89.1 %
Total hemoglobin: 13.4 g/dL (ref 12.0–16.0)

## 2023-05-08 LAB — CBC
HCT: 43.2 % (ref 39.0–52.0)
Hemoglobin: 13.2 g/dL (ref 13.0–17.0)
MCH: 26.7 pg (ref 26.0–34.0)
MCHC: 30.6 g/dL (ref 30.0–36.0)
MCV: 87.3 fL (ref 80.0–100.0)
Platelets: 208 10*3/uL (ref 150–400)
RBC: 4.95 MIL/uL (ref 4.22–5.81)
RDW: 15.8 % — ABNORMAL HIGH (ref 11.5–15.5)
WBC: 13.2 10*3/uL — ABNORMAL HIGH (ref 4.0–10.5)
nRBC: 0 % (ref 0.0–0.2)

## 2023-05-08 LAB — HEPATITIS PANEL, ACUTE
HCV Ab: NONREACTIVE
Hep A IgM: NONREACTIVE
Hep B C IgM: NONREACTIVE
Hepatitis B Surface Ag: NONREACTIVE

## 2023-05-08 LAB — LIPASE, BLOOD: Lipase: 24 U/L (ref 11–51)

## 2023-05-08 LAB — MAGNESIUM: Magnesium: 1.5 mg/dL — ABNORMAL LOW (ref 1.7–2.4)

## 2023-05-08 LAB — CK TOTAL AND CKMB (NOT AT ARMC)
CK, MB: 3.7 ng/mL (ref 0.5–5.0)
Total CK: 320 U/L (ref 49–397)

## 2023-05-08 LAB — LACTIC ACID, PLASMA: Lactic Acid, Venous: 1.7 mmol/L (ref 0.5–1.9)

## 2023-05-08 LAB — STREP PNEUMONIAE URINARY ANTIGEN: Strep Pneumo Urinary Antigen: NEGATIVE

## 2023-05-08 MED ORDER — PANTOPRAZOLE SODIUM 40 MG IV SOLR
40.0000 mg | INTRAVENOUS | Status: DC
  Administered 2023-05-08 – 2023-05-12 (×5): 40 mg via INTRAVENOUS
  Filled 2023-05-08 (×5): qty 10

## 2023-05-08 MED ORDER — MAGNESIUM SULFATE 2 GM/50ML IV SOLN
2.0000 g | Freq: Once | INTRAVENOUS | Status: AC
  Administered 2023-05-08: 2 g via INTRAVENOUS
  Filled 2023-05-08: qty 50

## 2023-05-08 MED ORDER — HEPARIN SODIUM (PORCINE) 1000 UNIT/ML DIALYSIS
1000.0000 [IU] | INTRAMUSCULAR | Status: DC | PRN
  Administered 2023-05-09: 3000 [IU] via INTRAVENOUS_CENTRAL
  Filled 2023-05-08 (×2): qty 6
  Filled 2023-05-08: qty 3

## 2023-05-08 MED ORDER — DEXTROSE 50 % IV SOLN: 12.5000 g | INTRAVENOUS | Status: AC

## 2023-05-08 MED ORDER — DEXTROSE 50 % IV SOLN
25.0000 g | INTRAVENOUS | Status: AC
Start: 2023-05-08 — End: 2023-05-08
  Administered 2023-05-08: 25 g via INTRAVENOUS

## 2023-05-08 MED ORDER — MILRINONE LACTATE IN DEXTROSE 20-5 MG/100ML-% IV SOLN
0.1250 ug/kg/min | INTRAVENOUS | Status: DC
  Administered 2023-05-08: 0.125 ug/kg/min via INTRAVENOUS
  Filled 2023-05-08: qty 100

## 2023-05-08 MED ORDER — ORAL CARE MOUTH RINSE: 15.0000 mL | OROMUCOSAL | Status: DC | PRN

## 2023-05-08 MED ORDER — DEXTROSE 50 % IV SOLN
12.5000 g | INTRAVENOUS | Status: AC
Start: 2023-05-08 — End: 2023-05-08
  Administered 2023-05-08: 12.5 g via INTRAVENOUS

## 2023-05-08 MED ORDER — PRISMASOL BGK 4/2.5 32-4-2.5 MEQ/L EC SOLN: Status: DC

## 2023-05-08 MED ORDER — NOREPINEPHRINE 4 MG/250ML-% IV SOLN
0.0000 ug/min | INTRAVENOUS | Status: DC
  Administered 2023-05-08: 11 ug/min via INTRAVENOUS
  Filled 2023-05-08: qty 250

## 2023-05-08 MED ORDER — ACETAMINOPHEN 650 MG RE SUPP
650.0000 mg | Freq: Once | RECTAL | Status: AC
  Administered 2023-05-08: 650 mg via RECTAL
  Filled 2023-05-08: qty 1

## 2023-05-08 MED ORDER — PRISMASOL BGK 4/2.5 32-4-2.5 MEQ/L REPLACEMENT SOLN: Status: DC

## 2023-05-08 MED ORDER — SODIUM CHLORIDE 0.9 % FOR CRRT: INTRAVENOUS_CENTRAL | Status: DC | PRN

## 2023-05-08 MED ORDER — DEXTROSE 50 % IV SOLN
INTRAVENOUS | Status: AC
  Administered 2023-05-08: 12.5 g via INTRAVENOUS
  Filled 2023-05-08: qty 50

## 2023-05-08 MED ORDER — DEXTROSE 10 % IV SOLN
INTRAVENOUS | Status: AC
Start: 2023-05-08 — End: 2023-05-08

## 2023-05-08 MED ORDER — SODIUM CHLORIDE 0.9 % IV SOLN: INTRAVENOUS | Status: DC | PRN

## 2023-05-08 MED ORDER — PIPERACILLIN-TAZOBACTAM 3.375 G IVPB 30 MIN: 3.3750 g | Freq: Four times a day (QID) | INTRAVENOUS | Status: DC

## 2023-05-08 MED ORDER — NOREPINEPHRINE 16 MG/250ML-% IV SOLN
0.0000 ug/min | INTRAVENOUS | Status: DC
  Administered 2023-05-08: 12 ug/min via INTRAVENOUS
  Filled 2023-05-08: qty 250

## 2023-05-08 MED ORDER — SODIUM CHLORIDE 0.9 % IV SOLN
1.0000 g | Freq: Three times a day (TID) | INTRAVENOUS | Status: DC
  Administered 2023-05-08 – 2023-05-12 (×12): 1 g via INTRAVENOUS
  Filled 2023-05-08 (×12): qty 20

## 2023-05-08 MED ORDER — CALCIUM GLUCONATE-NACL 2-0.675 GM/100ML-% IV SOLN
2.0000 g | Freq: Once | INTRAVENOUS | Status: AC
  Administered 2023-05-08: 2000 mg via INTRAVENOUS
  Filled 2023-05-08: qty 100

## 2023-05-08 MED ORDER — ORAL CARE MOUTH RINSE
15.0000 mL | OROMUCOSAL | Status: DC
  Administered 2023-05-08 – 2023-05-09 (×3): 15 mL via OROMUCOSAL

## 2023-05-08 MED ORDER — DEXTROSE 10 % IV SOLN: INTRAVENOUS | Status: DC

## 2023-05-08 NOTE — Progress Notes (Signed)
Patient currently has a temperature of 38.5 Celsius. Rectal  tylenol was given. This RN attempted to place ice packs on the patient but the patient refused. Will continue to monitor

## 2023-05-08 NOTE — Plan of Care (Signed)
  Problem: Clinical Measurements: Goal: Dialysis access will remain free of complications Outcome: Progressing   

## 2023-05-08 NOTE — Procedures (Signed)
Arterial Line Insertion Start/End2/05/2023 3:45 PM, 05/08/2023 3:56 PM  Patient location: ICU. Preanesthetic checklist: patient identified, IV checked, site marked, risks and benefits discussed, surgical consent, monitors and equipment checked, pre-op evaluation and timeout performed Left, radial was placed Catheter size: 20 G Hand hygiene performed  and maximum sterile barriers used  Allen's test indicative of satisfactory collateral circulation Attempts: 1 Procedure performed without using ultrasound guided technique. Following insertion, dressing applied. Post procedure assessment: normal and unchanged  Patient tolerated the procedure well with no immediate complications. Additional procedure comments: RTx2 placed arterial line without any complications.

## 2023-05-08 NOTE — Progress Notes (Signed)
Advanced Heart Failure Rounding Note  Cardiologist: None  Chief Complaint:  Subjective:    Still altered, not making urine. Coox 89 this morning and presentation consistent with more septic shock. Plan to start CRRT today. CT head overnight for worsening mental status, no evidence of ischemia or hemorrhage.    Objective:   Weight Range: (!) 146.5 kg Body mass index is 42.61 kg/m.   Vital Signs:   Temp:  [99.5 F (37.5 C)-102 F (38.9 C)] 102 F (38.9 C) (02/02 0745) Pulse Rate:  [62-106] 103 (02/02 0803) Resp:  [10-24] 24 (02/02 0803) BP: (78-153)/(37-132) 104/60 (02/02 0745) SpO2:  [90 %-100 %] 93 % (02/02 0803) FiO2 (%):  [40 %-50 %] 40 % (02/02 0803) Weight:  [146.5 kg] 146.5 kg (02/02 0455) Last BM Date : 05/07/23  Weight change: Filed Weights   05/06/23 2000 05/07/23 0500 05/08/23 0455  Weight: (!) 145.6 kg (!) 145.7 kg (!) 146.5 kg    Intake/Output:   Intake/Output Summary (Last 24 hours) at 05/08/2023 0947 Last data filed at 05/08/2023 0700 Gross per 24 hour  Intake 1188.71 ml  Output 25 ml  Net 1163.71 ml      Physical Exam    General:  obtunded, critically ill Cor: tachycardic, no m/g/r, elevated JVP, warm extremities, 2+ edema Lungs: diminshed breath sounds, BiPAP on Abdomen: soft, nontender, nondistended.   Labs    CBC Recent Labs    05/06/23 2226 05/07/23 0059 05/07/23 1501 05/08/23 0425 05/08/23 0508  WBC 12.8* 13.4*  --   --  13.2*  NEUTROABS 9.5*  --   --   --   --   HGB 16.8 17.3*   < > 15.0 13.2  HCT 57.6* 58.8*   < > 44.0 43.2  MCV 89.6 89.6  --   --  87.3  PLT 316 291  --   --  208   < > = values in this interval not displayed.   Basic Metabolic Panel Recent Labs    16/10/96 0059 05/07/23 1501 05/08/23 0425 05/08/23 0508  NA 138   < > 137 135  K 4.7   < > 4.3 4.1  CL 99  --   --  91*  CO2 21*  --   --  23  GLUCOSE 100*  --   --  271*  BUN 28*  --   --  42*  CREATININE 2.83*  --   --  5.05*  CALCIUM 9.2  --   --   6.6*  MG 2.1  --   --  1.5*   < > = values in this interval not displayed.   Liver Function Tests Recent Labs    05/07/23 0059 05/08/23 0508  AST 1,258* 1,792*  ALT 454* 695*  ALKPHOS 72 66  BILITOT 3.2* 2.5*  PROT 7.5 7.6  ALBUMIN 3.6 2.7*   Recent Labs    05/08/23 0508  LIPASE 24   Cardiac Enzymes Recent Labs    05/08/23 0508  CKTOTAL 320  CKMB 3.7    BNP: BNP (last 3 results) Recent Labs    05/04/23 1645  BNP 1,275.2*    ProBNP (last 3 results) No results for input(s): "PROBNP" in the last 8760 hours.   D-Dimer No results for input(s): "DDIMER" in the last 72 hours. Hemoglobin A1C No results for input(s): "HGBA1C" in the last 72 hours. Fasting Lipid Panel Recent Labs    05/07/23 0059  CHOL 120  HDL 21*  LDLCALC 84  TRIG 74  CHOLHDL 5.7   Thyroid Function Tests No results for input(s): "TSH", "T4TOTAL", "T3FREE", "THYROIDAB" in the last 72 hours.  Invalid input(s): "FREET3"  Other results:    Medications:     Scheduled Medications:  aspirin  81 mg Oral Daily   Chlorhexidine Gluconate Cloth  6 each Topical Daily   enoxaparin (LOVENOX) injection  70 mg Subcutaneous Q24H   insulin aspart  0-9 Units Subcutaneous TID WC   pantoprazole  40 mg Oral Daily   potassium chloride  40 mEq Oral Daily   sodium chloride flush  10-40 mL Intracatheter Q12H   venlafaxine XR  150 mg Oral BID    Infusions:   prismasol BGK 4/2.5      prismasol BGK 4/2.5     sodium chloride Stopped (05/08/23 0513)   calcium gluconate     dextrose 25 mL/hr at 05/08/23 4098   furosemide (LASIX) 200 mg in dextrose 5 % 100 mL (2 mg/mL) infusion 20 mg/hr (05/08/23 0721)   magnesium sulfate bolus IVPB     milrinone 0.25 mcg/kg/min (05/08/23 0700)   norepinephrine (LEVOPHED) Adult infusion 4 mcg/min (05/08/23 0700)   piperacillin-tazobactam     prismasol BGK 4/2.5      PRN Medications: acetaminophen **OR** acetaminophen, albuterol, fentaNYL (SUBLIMAZE) injection,  heparin, nitroGLYCERIN, mouth rinse, sodium chloride, sodium chloride flush    Patient Profile   ABDULKADIR Owen is a 58 y.o. male with a PMH of obesity, HTN, HLD, DM2, OSA who presents to the [facility] ICU with mixed septic and cardiogenic shock.   Assessment/Plan   Mixed cardiogenic/septic shock: Presented febrile, cold on exam, with worsening blood pressure. Septic component worsening based on exam and blood pressure. Will reduce milrinone to 0.123mcg/kg/min, still anuric so will start CRRT. - Continue norepi, reduce milrinone to 0.125 - PICC line in place - CRRT, nephrology consult - lactic acid cleared with BP support - Abx per CCM - CMR/coronary evaluation when recovered - CRP/ESR only mildly elevated   Acute hypoxic and hypercarbic respiratory failure: Currently obtunded on BiPAP, protecting airway. CRRT to start today. Secondary to ongoing infection, acute heart failure. - Continue positive pressure support - serial ABG - CRRT   AKI: Suspect due to ongoing shock, anuric currently. Nephrology consulted. - Stop lasix gtt, start CRRT   Shock liver: Increasing rapidly with ongoing shock. - Management as above  Diabetes: Multiple hypoglycemic events overnight, A1c 10.2 on arrival. - Management per CCM, SSI    Length of Stay: 3  Dylan Minus, MD  05/08/2023, 9:47 AM  Advanced Heart Failure Team Pager [phone] (M-F; 7a - 5p)  Please contact [facility] for night-coverage after hours (5p -7a ) and weekends on [email or web address]  CRITICAL CARE Performed by: Dylan Owen   Total critical care time: 45 minutes  Critical care time was exclusive of separately billable procedures and treating other patients.  Critical care was necessary to treat or prevent imminent or life-threatening deterioration.  Critical care was time spent personally by me on the following activities: development of treatment plan with patient and/or surrogate as well as nursing, discussions with  consultants, evaluation of patient's response to treatment, examination of patient, obtaining history from patient or surrogate, ordering and performing treatments and interventions, ordering and review of laboratory studies, ordering and review of radiographic studies, pulse oximetry and re-evaluation of patient's condition.

## 2023-05-08 NOTE — Progress Notes (Signed)
HYPOGLYCEMIC EVENT At 0504 patient's CBG was 67.  12.5 g D5 given IV  Repeat CBG 100 Will continue to monitor

## 2023-05-08 NOTE — Progress Notes (Signed)
Pharmacy Antibiotic Note  Dylan Owen is a 58 y.o. male admitted on 05/04/2023, now starting CRRT.  Pharmacy has been consulted for Zosyn dosing for PNA.  Plan: Change Zosyn to 3.375g IV Q6H (30-min infusion).  Vernard Gambles, PharmD, BCPS  05/08/2023 8:33 AM

## 2023-05-08 NOTE — Progress Notes (Signed)
Patient is being cooled through the CRRT machine with the thermax set at 36.0 degrees C.

## 2023-05-08 NOTE — Progress Notes (Signed)
NAME:  Dylan Owen, MRN:  [id number], DOB:  [birth year], LOS: 3 ADMISSION DATE:  05/04/2023, CONSULTATION DATE:  05/07/23 REFERRING MD:  Stevphen Meuse, CHIEF COMPLAINT:  resp failure shock    History of Present Illness:  58 year old male with significant obesity, hypertension, hyperlipidemia, type 2 diabetes, sleep apnea, chronic back pain, depression, acid reflux.  Also depression significant home medications include Lasix, hydrochlorothiazide.  Lisinopril, metoprolol, nitroglycerin but also Robaxin and MS Contin 30 mg twice daily along with Repatha.  And Effexor.  Along with insulin for diabetes.  She he walks around with a cane at baseline.  He also admits to medication noncompliance.  And 10 pound weight gain he is not on oxygen at home.  His hemoglobin A1c was 10.2 at admission consistent with very poor compliance.   Admitted on 05/04/2023 with complaints of cough, sore throat, dizziness and bodyaches for 1 week.  Also included chest tightness headaches chills diaphoresis.  At the time of arrival to the ED also had dyspnea on exertion.  At admission BNP was 1275.  Respiratory virus panel was negative.  He was started on Lasix 60 twice daily and his home lisinopril.  He was initially hypertensive and with this management blood pressure improved.  Echocardiogram showed low ejection fraction approximately 25% although poor acoustic windows.  Chest x-ray suggestive of venous congestion.  Cardiology consultation 05/06/2023 noted net -1.6 L since admission and patient was given history of increased frequency of urination.  Diagnose of viral myocarditis was made and left a right heart catheterization was considered.  Troponin barely elevated and consistent with viral myocarditis.   Postadmission with diuresis as of 05/06/2023 creatinine rose up to 1.57 mg percent [baseline 1 mg percent].  At this point lisinopril was held.  811/31/25 developed hypoglycemia requiring infusion of dextrose and transferred to the  intensive care unit.  Post transfer to the intensive care unit he developed acute fever and also BiPAP dependency.  Along with worsening renal failure further to creatinine of 2.83 mg percent along with persistent lactic acidosis around 3.  And mild intermittent delirium.  Overnight diaphoresis reported but despite all this nursing consistently reports that his periphery is extremely cold.     Cardiology has been reconsulted.  Critical care medicine consulted.  Pertinent  Medical History  B12 deficiency, Blood transfusion without reported diagnosis, Chronic low back pain, Depression, Diabetes mellitus with complication (HCC), GERD (gastroesophageal reflux disease), Heart murmur, History of adenomatous polyp of colon (05/22/2021), Hypercholesterolemia, Hypertension, OSA (obstructive sleep apnea), Peripheral neuropathy, Persistent asthma, Seizures (HCC), Stable angina (HCC), Statin intolerance, and Subclinical hypothyroidism (06/2017).    reports that he has never smoked. His smokeless tobacco use includes chew.  Significant Hospital Events: Including procedures, antibiotic start and stop dates in addition to other pertinent events   1/29 admit to [facility], diuresis 1/30 ECHO w LVEF 25% 1/31 cards consult, c/f viral myocarditis  2/1 PCCM consult txf to [facility], Adv HF consult  2/2 for HD cath placement and CRRT   Interim History / Subjective:  Not making much urine despite lasix gtt  Coox 89 on 4 NE and 0.25 milrinone   Febrile 102 on zosyn, mild leukocytosis 13 pct only 1.09    Objective   Blood pressure (!) 108/56, pulse (!) 108, temperature (!) 102.7 F (39.3 C), resp. rate 13, height 6\' 1"  (1.854 m), weight (!) 146.5 kg, SpO2 98%. CVP:  [12 mmHg-25 mmHg] 14 mmHg  Vent Mode: PCV;BIPAP FiO2 (%):  [40 %] 40 % Set  Rate:  [18 bmp] 18 bmp PEEP:  [6 cmH20] 6 cmH20   Intake/Output Summary (Last 24 hours) at 05/08/2023 1331 Last data filed at 05/08/2023 1300 Gross per 24 hour  Intake 1676.18 ml   Output 166 ml  Net 1510.18 ml   Filed Weights   05/06/23 2000 05/07/23 0500 05/08/23 0455  Weight: (!) 145.6 kg (!) 145.7 kg (!) 146.5 kg    Examination: General: Chronically and acutely ill M on BiPAP HENT: NCAT  Lungs: Moaning obscuring lung sounds, symmetrical chest expansion, on BiPAP   Cardiovascular: tachycardic, s1s2  Abdomen: obese soft ndnt  Extremities: edematous lower extremities  Neuro: Awake moving spontaneously, protecting airway GU: foley with dark urine   Resolved Hospital Problem list     Assessment & Plan:   Mixed septic and Cardiogenic shock C/f viral myocarditis  Acute HFrEF  P -milrinone, NE -- anticipate weaning milrinone given coox  -zosyn (MRSA PCR neg)  -follow cx data   Acute encephalopathy -favor metabolic. CT H 2/1 without intracranial abnormality  P -CRRT as below -check ammonia  -delirium precautions   Acute hypoxic and hypercarbic resp failure Hx OSA  Possible CAP  Pulm edema  -RVP covid flu neg  P -cont BiPAP -volume off via crrt   AKI, likely ATN  P -will need HD cath placement and CRRT to start 2/2   Elevated LFTs -- shock liver / congestive hepatopathy  Mild hyperbilirubinemia  Coagulopathy  P -repeat LFTs 2/2 afternoon , send coags -checking acute hepatitis panel but think this is less likely   Hypoglycemia P -incr his d10 -- ultimately have incr to 40  -q2 CBG -- check off of picc  -watching closely as this may relate to liver dysfxn above   Hypomagnesemia Hypocalcemia  P -replace   Best Practice (right click and "Reselect all SmartList Selections" daily)   Diet/type: NPO DVT prophylaxis LMWH Pressure ulcer(s): pressure ulcer assessment deferred  GI prophylaxis: PPI Lines: Central line, Dialysis Catheter, and yes and it is still needed Foley:  Yes, and it is still needed Code Status:  full code Last date of multidisciplinary goals of care discussion [--]  Labs   CBC: Recent Labs  Lab  05/04/23 1645 05/05/23 0346 05/06/23 0504 05/06/23 2226 05/07/23 0059 05/07/23 1501 05/08/23 0425 05/08/23 0508  WBC 9.3 9.4 9.9 12.8* 13.4*  --   --  13.2*  NEUTROABS 7.4 6.9  --  9.5*  --   --   --   --   HGB 15.0 14.8 16.1 16.8 17.3* 16.0 15.0 13.2  HCT 49.7 48.9 53.3* 57.6* 58.8* 47.0 44.0 43.2  MCV 86.9 87.5 89.0 89.6 89.6  --   --  87.3  PLT 248 262 272 316 291  --   --  208    Basic Metabolic Panel: Recent Labs  Lab 05/05/23 0633 05/06/23 0504 05/06/23 2037 05/06/23 2226 05/07/23 0059 05/07/23 1501 05/08/23 0425 05/08/23 0508  NA 138 137 139 138 138 137 137 135  K 3.2* 4.0 4.5 4.7 4.7 4.6 4.3 4.1  CL 99 96* 97* 95* 99  --   --  91*  CO2 28 27 24 25  21*  --   --  23  GLUCOSE 104* 86 99 68* 100*  --   --  271*  BUN 18 21* 26* 27* 28*  --   --  42*  CREATININE 1.17 1.57* 2.45* 2.82* 2.83*  --   --  5.05*  CALCIUM 8.7* 8.9 8.9 9.0 9.2  --   --  6.6*  MG 1.7  --   --   --  2.1  --   --  1.5*   GFR: Estimated Creatinine Clearance: 24.3 mL/min (A) (by C-G formula based on SCr of 5.05 mg/dL (H)). Recent Labs  Lab 05/06/23 0504 05/06/23 2226 05/06/23 2226 05/07/23 0059 05/07/23 0527 05/07/23 1601 05/08/23 0508  PROCALCITON  --  0.21  --   --   --   --  1.09  WBC 9.9 12.8*  --  13.4*  --   --  13.2*  LATICACIDVEN  --  3.7*   < > 3.8* 3.3* 1.4 1.7   < > = values in this interval not displayed.    Liver Function Tests: Recent Labs  Lab 05/04/23 1645 05/05/23 0633 05/06/23 2226 05/07/23 0059 05/08/23 0508  AST 21 22 625* 1,258* 1,792*  ALT 12 11 252* 454* 695*  ALKPHOS 70 67 70 72 66  BILITOT 2.0* 1.9* 3.1* 3.2* 2.5*  PROT 7.4 7.0 7.6 7.5 7.6  ALBUMIN 3.5 3.2* 3.4* 3.6 2.7*   Recent Labs  Lab 05/08/23 0508  LIPASE 24   Recent Labs  Lab 05/07/23 0836  AMMONIA <10    ABG    Component Value Date/Time   PHART 7.306 (L) 05/08/2023 0425   PCO2ART 56.8 (H) 05/08/2023 0425   PO2ART 92 05/08/2023 0425   HCO3 27.9 05/08/2023 0425   TCO2 30  05/08/2023 0425   O2SAT 89.1 05/08/2023 0508     Coagulation Profile: Recent Labs  Lab 05/07/23 0836  INR 1.7*    Cardiac Enzymes: Recent Labs  Lab 05/08/23 0508  CKTOTAL 320  CKMB 3.7    HbA1C: Hemoglobin A1C  Date/Time Value Ref Range Status  11/05/2022 01:58 PM 9.1 (A) 4.0 - 5.6 % Final  08/05/2022 01:54 PM 8.1 (A) 4.0 - 5.6 % Final   HbA1c, POC (prediabetic range)  Date/Time Value Ref Range Status  11/05/2022 01:58 PM 9.1 (A) 5.7 - 6.4 % Final  08/05/2022 01:54 PM 8.1 (A) 5.7 - 6.4 % Final   HbA1c, POC (controlled diabetic range)  Date/Time Value Ref Range Status  11/05/2022 01:58 PM 9.1 (A) 0.0 - 7.0 % Final  08/05/2022 01:54 PM 8.1 (A) 0.0 - 7.0 % Final   HbA1c POC (<> result, manual entry)  Date/Time Value Ref Range Status  11/05/2022 01:58 PM 9.1 4.0 - 5.6 % Final  08/05/2022 01:54 PM 8.1 4.0 - 5.6 % Final   Hgb A1c MFr Bld  Date/Time Value Ref Range Status  05/05/2023 03:46 AM 10.2 (H) 4.8 - 5.6 % Final    Comment:    (NOTE) Pre diabetes:          5.7%-6.4%  Diabetes:              >6.4%  Glycemic control for   <7.0% adults with diabetes     CBG: Recent Labs  Lab 05/08/23 0504 05/08/23 0547 05/08/23 0754 05/08/23 0841 05/08/23 1210  GLUCAP 67* 100* 69* 73 63*     Critical care time:        CRITICAL CARE Performed by: Lanier Clam   Total critical care time: 42 minutes  Critical care time was exclusive of separately billable procedures and treating other patients. Critical care was necessary to treat or prevent imminent or life-threatening deterioration.  Critical care was time spent personally by me on the following activities: development of treatment plan with patient and/or surrogate as well as nursing, discussions with consultants, evaluation of patient's  response to treatment, examination of patient, obtaining history from patient or surrogate, ordering and performing treatments and interventions, ordering and review of  laboratory studies, ordering and review of radiographic studies, pulse oximetry and re-evaluation of patient's condition.  [name] MSN, AGACNP-BC [facility] Amion for pager 05/08/2023, 1:31 PM

## 2023-05-08 NOTE — Progress Notes (Signed)
Pharmacy Antibiotic Note  Dylan Owen is a 58 y.o. male admitted on 05/04/2023, now starting CRRT.  Pharmacy has been consulted for Zosyn dosing for PNA.with ongoing fevers today will broaden antibiotics to meropenem  CRRT started earlier today   Plan: Stop zosyn  Meropenem 1gm IV q8h while on crrt Monitor CRRT and follow up culture data    Leota Sauers Pharm.D. CPP, BCPS Clinical Pharmacist [phone] 05/08/2023 3:06 PM

## 2023-05-08 NOTE — Consult Note (Signed)
[facility] Renal Consultation Note  Requesting MD: Chand Indication for Consultation: AKI  HPI:  Dylan Owen is a 58 y.o. male with HTN, T2 DM, obesity, OSA. He presented with SOB in the setting of noncompliance with medications-  positive troponin. He was found to have a decreased EF and CHF-  diuresis has been attempted but unfortunately not that successful-  ended up in ICU on milrinone and norepi-  only 30 ccs of urine last 24 hours-  crt up to 5-   I have been consulted to provide CRRT.  Urine with large blood , .> 50 RBC on one urine and no cells on another-  CK is OK.  Patient is on bipap but altered, will look at me but no conversation.   Many hypoglycemic events overnight.  Family is not at bedside-  tried to call [name] but no answer  Creat  Date/Time Value Ref Range Status  11/05/2022 02:17 PM 1.07 0.70 - 1.30 mg/dL Final  16/01/9603 54:09 PM 0.86 0.70 - 1.33 mg/dL Final    Comment:    For patients >66 years of age, the reference limit for Creatinine is approximately 13% higher for people identified as African-American. Marland Kitchen   10/18/2019 02:28 PM 0.81 0.70 - 1.33 mg/dL Final    Comment:    For patients >39 years of age, the reference limit for Creatinine is approximately 13% higher for people identified as African-American. Marland Kitchen   08/10/2012 10:20 AM 1.26 0.50 - 1.35 mg/dL Final  81/19/1478 29:56 AM 0.94 0.50 - 1.35 mg/dL Final  21/30/8657 84:69 PM 0.97 0.50 - 1.35 mg/dL Final  62/95/2841 32:44 AM 0.81 0.50 - 1.35 mg/dL Final  04/07/7251 66:44 AM 1.18 0.50 - 1.35 mg/dL Final   Creatinine, Ser  Date/Time Value Ref Range Status  05/08/2023 05:08 AM 5.05 (H) 0.61 - 1.24 mg/dL Final  03/47/4259 56:38 AM 2.83 (H) 0.61 - 1.24 mg/dL Final  75/64/3329 51:88 PM 2.82 (H) 0.61 - 1.24 mg/dL Final  41/66/0630 16:01 PM 2.45 (H) 0.61 - 1.24 mg/dL Final  09/32/3557 32:20 AM 1.57 (H) 0.61 - 1.24 mg/dL Final  25/42/7062 37:62 AM 1.17 0.61 - 1.24 mg/dL Final  83/15/1761  60:73 PM 1.11 0.61 - 1.24 mg/dL Final  71/09/2692 85:46 PM 0.83 0.40 - 1.50 mg/dL Final  27/06/5007 38:18 PM 0.86 0.40 - 1.50 mg/dL Final  29/93/7169 67:89 PM 0.88 0.40 - 1.50 mg/dL Final  38/01/1750 02:58 AM 0.79 0.61 - 1.24 mg/dL Final  52/77/8242 35:36 PM 0.70 0.40 - 1.50 mg/dL Final  14/43/1540 08:67 PM 0.92 0.40 - 1.50 mg/dL Final  61/95/0932 67:12 AM 0.86 0.40 - 1.50 mg/dL Final  45/80/9983 38:25 PM 0.69 0.40 - 1.50 mg/dL Final  05/39/7673 41:93 AM 0.86 0.40 - 1.50 mg/dL Final  79/05/4095 35:32 AM 0.98 0.40 - 1.50 mg/dL Final  99/24/2683 41:96 AM 0.89 0.40 - 1.50 mg/dL Final  22/29/7989 21:19 AM 1.13 0.40 - 1.50 mg/dL Final  41/74/0814 48:18 AM 0.81 0.40 - 1.50 mg/dL Final  56/31/4970 26:37 AM 1.1 0.4 - 1.5 mg/dL Final  85/88/5027 74:12 PM 0.9 0.4 - 1.5 mg/dL Final  87/86/7672 09:47 PM 0.90 0.50 - 1.35 mg/dL Final  09/62/8366 29:47 PM 0.83 0.40 - 1.50 mg/dL Final  65/46/5035 46:56 PM 0.89 0.40 - 1.50 mg/dL Final  81/27/5170 01:74 PM 0.91 0.40 - 1.50 mg/dL Final  94/49/6759 16:38 PM 0.95 0.40 - 1.50 mg/dL Final  46/65/9935 70:17 PM 0.89 0.40 - 1.50 mg/dL Final  79/39/0300 92:33 PM 1.04 0.40 -  1.50 mg/dL Final  78/46/9629 52:84 PM 1.02 0.40 - 1.50 mg/dL Final  13/24/4010 27:25 PM 0.91 0.40 - 1.50 mg/dL Final  36/64/4034 74:25 AM 1.09 0.40 - 1.50 mg/dL Final  95/63/8756 43:32 PM 1.03 0.40 - 1.50 mg/dL Final  95/18/8416 60:63 PM 0.95 0.40 - 1.50 mg/dL Final     PMHx:   Past Medical History:  Diagnosis Date   B12 deficiency    Blood transfusion without reported diagnosis    Chronic low back pain    Dr. Vear Clock is pain mgmt MD   Depression    Diabetes mellitus with complication (HCC)    DPN + microalbuminuria   GERD (gastroesophageal reflux disease)    Heart murmur    History of adenomatous polyp of colon 05/22/2021   recall 7 yrs   Hypercholesterolemia    Statin intolerant.  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   Persistent asthma    Never smoker   Seizures (HCC)    Stable angina (HCC)    nl myoview 12/07  followed by Dr Eden Emms   Statin intolerance    myalgias   Subclinical hypothyroidism 06/2017    Past Surgical History:  Procedure Laterality Date   COLONOSCOPY  05/22/2021   Adenomas--recall 7 years   CYSTOSCOPY WITH RETROGRADE URETHROGRAM N/A 08/21/2021   Procedure: RETROGRADE URETHROGRAM;  Surgeon: Crist Fat, MD;  Location: [facility];  Service: Urology;  Laterality: N/A;   CYSTOSCOPY WITH URETHRAL DILATATION N/A 08/21/2021   Procedure: CYSTOSCOPY WITH URETHRAL DILATATION OPTILUME;  Surgeon: Crist Fat, MD;  Location: [facility];  Service: Urology;  Laterality: N/A;   LUMBAR SPINE SURGERY     lumbar laminectomy with hardware stabilization, with ensuing arachnoiditis   URETHRAL STRICTURE DILATATION  1996 and 2004.   Laser surgery.    Family Hx:  Family History  Problem Relation Age of Onset   Cancer Paternal Grandfather        prostate   Heart disease Other    Hypertension Other    Other Other        Emotional Illness   Colon cancer Neg Hx    Esophageal cancer Neg Hx    Rectal cancer Neg Hx    Stomach cancer Neg Hx     Social History:  reports that he has never smoked. His smokeless tobacco use includes chew. He reports that he does not drink alcohol and does not use drugs.  Allergies:  Allergies  Allergen Reactions   Ace Inhibitors     Kidney failure   Atorvastatin Other (See Comments)    Arm pain   Prednisone     REACTION: Anxiety   Septra [Sulfamethoxazole-Trimethoprim]    Metformin And Related Diarrhea    Medications: Prior to Admission medications   Medication Sig Start Date End Date Taking? Authorizing Provider  Acetaminophen (TYLENOL PO) Take 500 mg by mouth daily as needed (pain).   Yes [provider]  albuterol  (VENTOLIN HFA) 108 (90 Base) MCG/ACT inhaler Inhale 1-2 puffs into the lungs every 6 (six) hours as needed for wheezing or shortness of breath. 11/09/22  Yes McGowen, Maryjean Morn, MD  esomeprazole (NEXIUM) 40 MG capsule Take 1 capsule (40 mg total) by mouth daily. 11/05/22  Yes McGowen, Maryjean Morn, MD  Evolocumab (REPATHA SURECLICK) 140 MG/ML  SOAJ INJECT 1 DOSE INTO THE SKIN EVERY 14 (FOURTEEN) DAYS. 09/21/22  Yes Hilty, Lisette Abu, MD  furosemide (LASIX) 20 MG tablet TAKE 1 TABLET BY MOUTH EVERY MORNING AS NEEDED FOR SWELLING 04/16/22  Yes McGowen, Maryjean Morn, MD  hydrochlorothiazide (HYDRODIURIL) 25 MG tablet Take 1 tablet (25 mg total) by mouth daily. 11/05/22 11/05/23 Yes McGowen, Maryjean Morn, MD  insulin aspart (NOVOLOG FLEXPEN) 100 UNIT/ML FlexPen 3 units SQ before breakfast, lunch, and dinner Patient taking differently: Inject 3 Units into the skin 3 (three) times daily as needed for high blood sugar. 3 units SQ before breakfast, lunch, and dinner 11/26/22  Yes McGowen, Maryjean Morn, MD  insulin degludec (TRESIBA FLEXTOUCH) 100 UNIT/ML FlexTouch Pen 80 U qAM and 72 U qPM 12/09/22  Yes McGowen, Maryjean Morn, MD  lisinopril (ZESTRIL) 40 MG tablet Take 1 tablet (40 mg total) by mouth daily. 11/05/22  Yes McGowen, Maryjean Morn, MD  methocarbamol (ROBAXIN) 500 MG tablet Take 500 mg by mouth 2 (two) times daily as needed for muscle spasms. 10/05/19  Yes [provider]  metoprolol tartrate (LOPRESSOR) 50 MG tablet TAKE 1 TABLET BY MOUTH TWICE A DAY. OFFICE VISIT NEEDED FOR FURTHER REFILLS 08/05/22  Yes McGowen, Maryjean Morn, MD  morphine (MS CONTIN) 30 MG 12 hr tablet Take 30-60 mg by mouth See admin instructions. Taking 60 mg in the AM and 30 mg in the afternoon and 60 mg at bedtime 10/05/19  Yes [provider]  Multiple Vitamin (MULTIVITAMIN) tablet Take 1 tablet by mouth daily.   Yes [provider]  nitroGLYCERIN (NITROSTAT) 0.4 MG SL tablet PLACE 1 TABLET UNDER THE TONGUE EVERY 5 MINUTES AS NEEDED. 08/07/21  Yes  McGowen, Maryjean Morn, MD  venlafaxine XR (EFFEXOR-XR) 150 MG 24 hr capsule Take 150 mg by mouth 2 (two) times daily. 09/26/15  Yes [provider]  B-D ULTRAFINE III SHORT PEN 31G X 8 MM MISC USE AS DIRECTED TWICE DAILY FOR MEDICATION INJECTION 09/21/21   McGowen, Maryjean Morn, MD  blood glucose meter kit and supplies KIT Dispense based on patient and insurance preference. Use to check glucose 4 times daily. 10/22/19   McGowen, Maryjean Morn, MD  Continuous Glucose Receiver (FREESTYLE LIBRE 3 READER) DEVI Use to check glucose continuously 11/09/22   McGowen, Maryjean Morn, MD  Continuous Glucose Sensor (FREESTYLE LIBRE 3 SENSOR) MISC Place 1 sensor on the skin every 14 days. Use to check glucose continuously 11/09/22   McGowen, Maryjean Morn, MD  FREESTYLE TEST STRIPS test strip USE TO CHECK GLUCOSE 4 TIMES DAILY. 05/05/21   McGowen, Maryjean Morn, MD    I have reviewed the patient's current medications.  Labs:  Results for orders placed or performed during the hospital encounter of 05/04/23 (from the past 48 hours)  Glucose, capillary     Status: Abnormal   Collection Time: 05/06/23  9:55 AM  Result Value Ref Range   Glucose-Capillary 132 (H) 70 - 99 mg/dL    Comment: Glucose reference range applies only to samples taken after fasting for at least 8 hours.  Glucose, capillary     Status: Abnormal   Collection Time: 05/06/23 11:15 AM  Result Value Ref Range   Glucose-Capillary 103 (H) 70 - 99 mg/dL    Comment: Glucose reference range applies only to samples taken after fasting for at least 8 hours.  Glucose, capillary     Status: None   Collection Time: 05/06/23  2:26 PM  Result Value Ref Range   Glucose-Capillary  90 70 - 99 mg/dL    Comment: Glucose reference range applies only to samples taken after fasting for at least 8 hours.  Glucose, capillary     Status: Abnormal   Collection Time: 05/06/23  4:04 PM  Result Value Ref Range   Glucose-Capillary 61 (L) 70 - 99 mg/dL    Comment: Glucose reference range  applies only to samples taken after fasting for at least 8 hours.  Glucose, capillary     Status: Abnormal   Collection Time: 05/06/23  4:31 PM  Result Value Ref Range   Glucose-Capillary 53 (L) 70 - 99 mg/dL    Comment: Glucose reference range applies only to samples taken after fasting for at least 8 hours.  Glucose, capillary     Status: Abnormal   Collection Time: 05/06/23  5:01 PM  Result Value Ref Range   Glucose-Capillary 61 (L) 70 - 99 mg/dL    Comment: Glucose reference range applies only to samples taken after fasting for at least 8 hours.  Glucose, capillary     Status: None   Collection Time: 05/06/23  5:24 PM  Result Value Ref Range   Glucose-Capillary 99 70 - 99 mg/dL    Comment: Glucose reference range applies only to samples taken after fasting for at least 8 hours.  Glucose, capillary     Status: None   Collection Time: 05/06/23  5:46 PM  Result Value Ref Range   Glucose-Capillary 98 70 - 99 mg/dL    Comment: Glucose reference range applies only to samples taken after fasting for at least 8 hours.  Glucose, capillary     Status: None   Collection Time: 05/06/23  6:20 PM  Result Value Ref Range   Glucose-Capillary 76 70 - 99 mg/dL    Comment: Glucose reference range applies only to samples taken after fasting for at least 8 hours.  Glucose, capillary     Status: None   Collection Time: 05/06/23  6:37 PM  Result Value Ref Range   Glucose-Capillary 83 70 - 99 mg/dL    Comment: Glucose reference range applies only to samples taken after fasting for at least 8 hours.  Glucose, capillary     Status: None   Collection Time: 05/06/23  7:15 PM  Result Value Ref Range   Glucose-Capillary 84 70 - 99 mg/dL    Comment: Glucose reference range applies only to samples taken after fasting for at least 8 hours.  Glucose, capillary     Status: Abnormal   Collection Time: 05/06/23  7:34 PM  Result Value Ref Range   Glucose-Capillary 152 (H) 70 - 99 mg/dL    Comment: Glucose  reference range applies only to samples taken after fasting for at least 8 hours.  Glucose, capillary     Status: Abnormal   Collection Time: 05/06/23  8:06 PM  Result Value Ref Range   Glucose-Capillary 140 (H) 70 - 99 mg/dL    Comment: Glucose reference range applies only to samples taken after fasting for at least 8 hours.   Comment 1 Notify RN    Comment 2 Document in Chart   Blood gas, venous     Status: Abnormal   Collection Time: 05/06/23  8:37 PM  Result Value Ref Range   pH, Ven 7.25 7.25 - 7.43   pCO2, Ven 73 (HH) 44 - 60 mmHg    Comment: CRITICAL RESULT CALLED TO, READ BACK BY AND VERIFIED WITH: HENRIQUEZ P. @ 2100 MCLEAN K.  pO2, Ven <31 (LL) 32 - 45 mmHg    Comment: CRITICAL RESULT CALLED TO, READ BACK BY AND VERIFIED WITH: HENRIQUEZ P. @ 2100 MCLEAN K.    Bicarbonate 32.0 (H) 20.0 - 28.0 mmol/L   Acid-Base Excess 2.5 (H) 0.0 - 2.0 mmol/L   O2 Saturation 28.4 %   Patient temperature 37.0     Comment: Performed at [facility], [address], [place], [place] [address]  Troponin I (High Sensitivity)     Status: Abnormal   Collection Time: 05/06/23  8:37 PM  Result Value Ref Range   Troponin I (High Sensitivity) 38 (H) <18 ng/L    Comment: (NOTE) Elevated high sensitivity troponin I (hsTnI) values and significant  changes across serial measurements may suggest ACS but many other  chronic and acute conditions are known to elevate hsTnI results.  Refer to the "Links" section for chest pain algorithms and additional  guidance. Performed at [facility], [address], [place], [place] [address]   Basic metabolic panel     Status: Abnormal   Collection Time: 05/06/23  8:37 PM  Result Value Ref Range   Sodium 139 135 - 145 mmol/L   Potassium 4.5 3.5 - 5.1 mmol/L   Chloride 97 (L) 98 - 111 mmol/L   CO2 24 22 - 32 mmol/L   Glucose, Bld 99 70 - 99 mg/dL    Comment: Glucose reference range applies only to samples taken after  fasting for at least 8 hours.   BUN 26 (H) 6 - 20 mg/dL   Creatinine, Ser 1.47 (H) 0.61 - 1.24 mg/dL   Calcium 8.9 8.9 - 82.9 mg/dL   GFR, Estimated 30 (L) >60 mL/min    Comment: (NOTE) Calculated using the CKD-EPI Creatinine Equation (2021)    Anion gap 18 (H) 5 - 15    Comment: Performed at [facility], [address] [place], [place] [address]  Glucose, capillary     Status: None   Collection Time: 05/06/23  9:47 PM  Result Value Ref Range   Glucose-Capillary 85 70 - 99 mg/dL    Comment: Glucose reference range applies only to samples taken after fasting for at least 8 hours.   Comment 1 Notify RN    Comment 2 Document in Chart   Culture, blood (x 2)     Status: None (Preliminary result)   Collection Time: 05/06/23 10:26 PM   Specimen: BLOOD  Result Value Ref Range   Specimen Description      BLOOD BLOOD LEFT ARM Performed at [facility], [address], [place], [place] [address]    Special Requests      BOTTLES DRAWN AEROBIC AND ANAEROBIC Blood Culture results may not be optimal due to an inadequate volume of blood received in culture bottles Performed at [facility], [address] [place], [place] [address]    Culture      NO GROWTH < 12 HOURS Performed at [facility], [address] [place], [place] [address]    Report Status PENDING   Culture, blood (x 2)     Status: None (Preliminary result)   Collection Time: 05/06/23 10:26 PM   Specimen: BLOOD  Result Value Ref Range   Specimen Description      BLOOD BLOOD RIGHT HAND Performed at [facility], [address] [place], [place] [address]    Special Requests      BOTTLES DRAWN  AEROBIC AND ANAEROBIC Blood Culture results may not be optimal due to an inadequate volume of blood received in culture bottles Performed at [facility], [address] [place], [place] [address]    Culture      NO GROWTH < 12  HOURS Performed at [facility], [address], [place], [place] [address]    Report Status PENDING   CBC with Differential     Status: Abnormal   Collection Time: 05/06/23 10:26 PM  Result Value Ref Range   WBC 12.8 (H) 4.0 - 10.5 K/uL   RBC 6.43 (H) 4.22 - 5.81 MIL/uL   Hemoglobin 16.8 13.0 - 17.0 g/dL   HCT 09.8 (H) 11.9 - 14.7 %   MCV 89.6 80.0 - 100.0 fL   MCH 26.1 26.0 - 34.0 pg   MCHC 29.2 (L) 30.0 - 36.0 g/dL   RDW 82.9 (H) 56.2 - 13.0 %   Platelets 316 150 - 400 K/uL   nRBC 0.0 0.0 - 0.2 %   Neutrophils Relative % 74 %   Neutro Abs 9.5 (H) 1.7 - 7.7 K/uL   Lymphocytes Relative 6 %   Lymphs Abs 0.7 0.7 - 4.0 K/uL   Monocytes Relative 18 %   Monocytes Absolute 2.3 (H) 0.1 - 1.0 K/uL   Eosinophils Relative 0 %   Eosinophils Absolute 0.0 0.0 - 0.5 K/uL   Basophils Relative 1 %   Basophils Absolute 0.1 0.0 - 0.1 K/uL   Immature Granulocytes 1 %   Abs Immature Granulocytes 0.17 (H) 0.00 - 0.07 K/uL    Comment: Performed at [facility], [address] [place], [place] [address]  Comprehensive metabolic panel     Status: Abnormal   Collection Time: 05/06/23 10:26 PM  Result Value Ref Range   Sodium 138 135 - 145 mmol/L   Potassium 4.7 3.5 - 5.1 mmol/L   Chloride 95 (L) 98 - 111 mmol/L   CO2 25 22 - 32 mmol/L   Glucose, Bld 68 (L) 70 - 99 mg/dL    Comment: Glucose reference range applies only to samples taken after fasting for at least 8 hours.   BUN 27 (H) 6 - 20 mg/dL   Creatinine, Ser 4.69 (H) 0.61 - 1.24 mg/dL   Calcium 9.0 8.9 - 62.9 mg/dL   Total Protein 7.6 6.5 - 8.1 g/dL   Albumin 3.4 (L) 3.5 - 5.0 g/dL   AST 528 (H) 15 - 41 U/L   ALT 252 (H) 0 - 44 U/L   Alkaline Phosphatase 70 38 - 126 U/L   Total Bilirubin 3.1 (H) 0.0 - 1.2 mg/dL   GFR, Estimated 25 (L) >60 mL/min    Comment: (NOTE) Calculated using the CKD-EPI Creatinine Equation (2021)    Anion gap 18 (H) 5 - 15    Comment: Performed at [facility], [address] [place], [place] [address]  Lactic acid, plasma     Status: Abnormal   Collection Time: 05/06/23 10:26 PM  Result Value Ref Range   Lactic Acid, Venous 3.7 (HH) 0.5 - 1.9 mmol/L    Comment: CRITICAL RESULT CALLED TO, READ BACK BY AND VERIFIED WITH HENRIQUEZ P. @2340  05/06/23 MCLEAN K. Performed at [facility], [address] [place], [place] [address]   Procalcitonin     Status: None   Collection Time: 05/06/23 10:26 PM  Result Value Ref Range   Procalcitonin 0.21 ng/mL    Comment:  Interpretation: PCT (Procalcitonin) <= 0.5 ng/mL: Systemic infection (sepsis) is not likely. Local bacterial infection is possible. (NOTE)       Sepsis PCT Algorithm           Lower Respiratory Tract                                      Infection PCT Algorithm    ----------------------------     ----------------------------         PCT < 0.25 ng/mL                PCT < 0.10 ng/mL          Strongly encourage             Strongly discourage   discontinuation of antibiotics    initiation of antibiotics    ----------------------------     -----------------------------       PCT 0.25 - 0.50 ng/mL            PCT 0.10 - 0.25 ng/mL               OR       >80% decrease in PCT            Discourage initiation of                                            antibiotics      Encourage discontinuation           of antibiotics    ----------------------------     -----------------------------         PCT >= 0.50 ng/mL              PCT 0.26 - 0.50 ng/mL               AND        <80% decrease in PCT             Encourage initiation of                                             antibiotics       Encourage continuation           of antibiotics    ----------------------------     -----------------------------        PCT >= 0.50 ng/mL                  PCT > 0.50 ng/mL               AND         increase in PCT                  Strongly encourage                                       initiation of antibiotics    Strongly encourage escalation           of antibiotics                                     -----------------------------  PCT <= 0.25 ng/mL                                                 OR                                        > 80% decrease in PCT                                      Discontinue / Do not initiate                                             antibiotics  Performed at [facility], [address], [place], [place] [address]   Glucose, capillary     Status: Abnormal   Collection Time: 05/06/23 11:26 PM  Result Value Ref Range   Glucose-Capillary 59 (L) 70 - 99 mg/dL    Comment: Glucose reference range applies only to samples taken after fasting for at least 8 hours.  Glucose, capillary     Status: Abnormal   Collection Time: 05/06/23 11:57 PM  Result Value Ref Range   Glucose-Capillary 112 (H) 70 - 99 mg/dL    Comment: Glucose reference range applies only to samples taken after fasting for at least 8 hours.  Glucose, capillary     Status: None   Collection Time: 05/07/23 12:58 AM  Result Value Ref Range   Glucose-Capillary 99 70 - 99 mg/dL    Comment: Glucose reference range applies only to samples taken after fasting for at least 8 hours.  Comprehensive metabolic panel     Status: Abnormal   Collection Time: 05/07/23 12:59 AM  Result Value Ref Range   Sodium 138 135 - 145 mmol/L   Potassium 4.7 3.5 - 5.1 mmol/L   Chloride 99 98 - 111 mmol/L   CO2 21 (L) 22 - 32 mmol/L   Glucose, Bld 100 (H) 70 - 99 mg/dL    Comment: Glucose reference range applies only to samples taken after fasting for at least 8 hours.   BUN 28 (H) 6 - 20 mg/dL   Creatinine, Ser 6.04 (H) 0.61 - 1.24 mg/dL   Calcium 9.2 8.9 - 54.0 mg/dL   Total Protein 7.5 6.5 - 8.1 g/dL   Albumin 3.6 3.5 - 5.0 g/dL   AST 9,811 (H) 15 - 41 U/L   ALT 454 (H) 0 - 44 U/L   Alkaline Phosphatase 72 38 - 126 U/L    Total Bilirubin 3.2 (H) 0.0 - 1.2 mg/dL   GFR, Estimated 25 (L) >60 mL/min    Comment: (NOTE) Calculated using the CKD-EPI Creatinine Equation (2021)    Anion gap 18 (H) 5 - 15    Comment: Performed at [facility], [address] [place], [place] [address]  CBC     Status: Abnormal   Collection Time: 05/07/23 12:59 AM  Result Value Ref Range   WBC 13.4 (H) 4.0 - 10.5 K/uL   RBC 6.56 (H) 4.22 - 5.81 MIL/uL   Hemoglobin 17.3 (  H) 13.0 - 17.0 g/dL   HCT 62.9 (H) 52.8 - 41.3 %   MCV 89.6 80.0 - 100.0 fL   MCH 26.4 26.0 - 34.0 pg   MCHC 29.4 (L) 30.0 - 36.0 g/dL   RDW 24.4 (H) 01.0 - 27.2 %   Platelets 291 150 - 400 K/uL   nRBC 0.0 0.0 - 0.2 %    Comment: Performed at [facility], [address] [place], [place] [address]  Magnesium     Status: None   Collection Time: 05/07/23 12:59 AM  Result Value Ref Range   Magnesium 2.1 1.7 - 2.4 mg/dL    Comment: Performed at [facility], [address], [place], [place] [address]  Lipid panel     Status: Abnormal   Collection Time: 05/07/23 12:59 AM  Result Value Ref Range   Cholesterol 120 0 - 200 mg/dL   Triglycerides 74 <425 mg/dL   HDL 21 (L) >95 mg/dL   Total CHOL/HDL Ratio 5.7 RATIO   VLDL 15 0 - 40 mg/dL   LDL Cholesterol 84 0 - 99 mg/dL    Comment:        Total Cholesterol/HDL:CHD Risk Coronary Heart Disease Risk Table                     Men   Women  1/2 Average Risk   3.4   3.3  Average Risk       5.0   4.4  2 X Average Risk   9.6   7.1  3 X Average Risk  23.4   11.0        Use the calculated Patient Ratio above and the CHD Risk Table to determine the patient's CHD Risk.        ATP III CLASSIFICATION (LDL):  <100     mg/dL   Optimal  638-756  mg/dL   Near or Above                    Optimal  130-159  mg/dL   Borderline  433-295  mg/dL   High  >188     mg/dL   Very High Performed at [facility], [address], [place], [place]  [address]   Lactic acid, plasma     Status: Abnormal   Collection Time: 05/07/23 12:59 AM  Result Value Ref Range   Lactic Acid, Venous 3.8 (HH) 0.5 - 1.9 mmol/L    Comment: CRITICAL VALUE NOTED. VALUE IS CONSISTENT WITH PREVIOUSLY REPORTED/CALLED VALUE Performed at [facility], [address] [place], [place] [address]   Troponin I (High Sensitivity)     Status: Abnormal   Collection Time: 05/07/23 12:59 AM  Result Value Ref Range   Troponin I (High Sensitivity) 109 (HH) <18 ng/L    Comment: DELTA CHECK NOTED CRITICAL RESULT CALLED TO, READ BACK BY AND VERIFIED WITH RUSSELL S. @ 0158 05/07/23 MCLEAN K. (NOTE) Elevated high sensitivity troponin I (hsTnI) values and significant  changes across serial measurements may suggest ACS but many other  chronic and acute conditions are known to elevate hsTnI results.  Refer to the "Links" section for chest pain algorithms and additional  guidance. Performed at [facility], [address], [place], [place] [address]   Glucose, capillary     Status: None   Collection Time: 05/07/23  1:57 AM  Result Value Ref Range   Glucose-Capillary 93 70 - 99  mg/dL    Comment: Glucose reference range applies only to samples taken after fasting for at least 8 hours.  Blood gas, arterial     Status: Abnormal   Collection Time: 05/07/23  2:15 AM  Result Value Ref Range   FIO2 40.00 %   Delivery systems BILEVEL POSITIVE AIRWAY PRESSURE    Inspiratory PAP 20.0 cmH2O   Expiratory PAP 6.0 cmH2O   pH, Arterial 7.3 (L) 7.35 - 7.45   pCO2 arterial 56 (H) 32 - 48 mmHg   pO2, Arterial 123 (H) 83 - 108 mmHg   Bicarbonate 26.9 20.0 - 28.0 mmol/L   Acid-Base Excess 0.2 0.0 - 2.0 mmol/L   O2 Saturation 100 %   Patient temperature 39.0    Collection site RIGHT RADIAL    Drawn by [id number]    Allens test (pass/fail) PASS PASS    Comment: Performed at [facility], [address], [place], [place] [address]   Glucose, capillary     Status: None   Collection Time: 05/07/23  2:59 AM  Result Value Ref Range   Glucose-Capillary 92 70 - 99 mg/dL    Comment: Glucose reference range applies only to samples taken after fasting for at least 8 hours.   Comment 1 Notify RN    Comment 2 Document in Chart   Glucose, capillary     Status: None   Collection Time: 05/07/23  4:15 AM  Result Value Ref Range   Glucose-Capillary 84 70 - 99 mg/dL    Comment: Glucose reference range applies only to samples taken after fasting for at least 8 hours.  Glucose, capillary     Status: None   Collection Time: 05/07/23  5:26 AM  Result Value Ref Range   Glucose-Capillary 92 70 - 99 mg/dL    Comment: Glucose reference range applies only to samples taken after fasting for at least 8 hours.  Lactic acid, plasma     Status: Abnormal   Collection Time: 05/07/23  5:27 AM  Result Value Ref Range   Lactic Acid, Venous 3.3 (HH) 0.5 - 1.9 mmol/L    Comment: CRITICAL RESULT CALLED TO, READ BACK BY AND VERIFIED WITH Clarisa Kindred, RN ON 05/07/2023 AT 0640 AM BY SL Performed at [facility], [address], [place], [place] [address]   Glucose, capillary     Status: None   Collection Time: 05/07/23  8:02 AM  Result Value Ref Range   Glucose-Capillary 82 70 - 99 mg/dL    Comment: Glucose reference range applies only to samples taken after fasting for at least 8 hours.  Hepatitis panel, acute     Status: None   Collection Time: 05/07/23  8:36 AM  Result Value Ref Range   Hepatitis B Surface Ag NON REACTIVE NON REACTIVE   HCV Ab NON REACTIVE NON REACTIVE    Comment: (NOTE) Nonreactive HCV antibody screen is consistent with no HCV infections,  unless recent infection is suspected or other evidence exists to indicate HCV infection.     Hep A IgM NON REACTIVE NON REACTIVE   Hep B C IgM NON REACTIVE NON REACTIVE    Comment: Performed at [facility], [address], [place], [place] [address]  Ammonia      Status: None   Collection Time: 05/07/23  8:36 AM  Result Value Ref Range   Ammonia <10 9 - 35 umol/L    Comment: Performed at [facility], [address]  [place], [place] [address]  Protime-INR     Status: Abnormal   Collection Time: 05/07/23  8:36 AM  Result Value Ref Range   Prothrombin Time 19.9 (H) 11.4 - 15.2 seconds   INR 1.7 (H) 0.8 - 1.2    Comment: (NOTE) INR goal varies based on device and disease states. Performed at [facility], [address] [place], [place] [address]   Urinalysis, Routine w reflex microscopic -Urine, Clean Catch     Status: Abnormal   Collection Time: 05/07/23  9:00 AM  Result Value Ref Range   Color, Urine RED (A) YELLOW    Comment: BIOCHEMICALS MAY BE AFFECTED BY COLOR   APPearance CLOUDY (A) CLEAR   Specific Gravity, Urine 1.020 1.005 - 1.030   pH 7.0 5.0 - 8.0   Glucose, UA 50 (A) NEGATIVE mg/dL   Hgb urine dipstick MODERATE (A) NEGATIVE   Bilirubin Urine NEGATIVE NEGATIVE   Ketones, ur NEGATIVE NEGATIVE mg/dL   Protein, ur 098 (A) NEGATIVE mg/dL   Nitrite NEGATIVE NEGATIVE   Leukocytes,Ua MODERATE (A) NEGATIVE   RBC / HPF 0-5 0 - 5 RBC/hpf   WBC, UA 0-5 0 - 5 WBC/hpf   Bacteria, UA NONE SEEN NONE SEEN   Squamous Epithelial / HPF 0-5 0 - 5 /HPF    Comment: Performed at [facility], [address] [place], [place] [address]  Blood gas, arterial     Status: Abnormal   Collection Time: 05/07/23  9:15 AM  Result Value Ref Range   FIO2 35 %   Delivery systems BILEVEL POSITIVE AIRWAY PRESSURE    Mode BILEVEL POSITIVE AIRWAY PRESSURE    RATE 22 resp/min   Inspiratory PAP 20 cmH2O   Expiratory PAP 6 cmH2O   pH, Arterial 7.31 (L) 7.35 - 7.45   pCO2 arterial 55 (H) 32 - 48 mmHg   pO2, Arterial 117 (H) 83 - 108 mmHg   Bicarbonate 27.5 20.0 - 28.0 mmol/L   Acid-Base Excess 0.9 0.0 - 2.0 mmol/L   O2 Saturation 98.9 %   Patient temperature 38.9    Collection site RIGHT RADIAL     Drawn by [id number]    Allens test (pass/fail) PASS PASS    Comment: Performed at [facility], [address] [place], [place] [address]  Glucose, capillary     Status: Abnormal   Collection Time: 05/07/23 11:10 AM  Result Value Ref Range   Glucose-Capillary 120 (H) 70 - 99 mg/dL    Comment: Glucose reference range applies only to samples taken after fasting for at least 8 hours.  Cooxemetry Panel (carboxy, met, total hgb, O2 sat)     Status: None   Collection Time: 05/07/23  2:35 PM  Result Value Ref Range   Total hemoglobin 14.1 12.0 - 16.0 g/dL   O2 Saturation 86.5 %   Carboxyhemoglobin 1.5 0.5 - 1.5 %   Methemoglobin 0.9 0.0 - 1.5 %    Comment: Performed at [facility], [address], [place], [place] [address]  C-reactive protein     Status: Abnormal   Collection Time: 05/07/23  2:35 PM  Result Value Ref Range   CRP 5.1 (H) <1.0 mg/dL    Comment: Performed at [facility], [address] [place], [place] [address]  Sedimentation rate     Status: None   Collection Time: 05/07/23  2:35 PM  Result Value Ref Range   Sed Rate 2 0 - 16 mm/hr  Comment: Performed at [facility], [address], [place], [place] [address]  Blood gas, venous     Status: Abnormal   Collection Time: 05/07/23  2:35 PM  Result Value Ref Range   pH, Ven 7.32 7.25 - 7.43   pCO2, Ven 56 44 - 60 mmHg   pO2, Ven 64 (H) 32 - 45 mmHg   Bicarbonate 28.9 (H) 20.0 - 28.0 mmol/L   Acid-Base Excess 1.6 0.0 - 2.0 mmol/L   O2 Saturation 92.3 %   Patient temperature 37.0     Comment: Performed at [facility], [address] [place], [place] [address]  I-STAT 7, (LYTES, BLD GAS, ICA, H+H)     Status: Abnormal   Collection Time: 05/07/23  3:01 PM  Result Value Ref Range   pH, Arterial 7.303 (L) 7.35 - 7.45   pCO2 arterial 60.4 (H) 32 - 48 mmHg   pO2, Arterial 131 (H) 83 - 108 mmHg   Bicarbonate 29.7 (H) 20.0 - 28.0 mmol/L   TCO2 31 22 - 32 mmol/L   O2 Saturation 98 %    Acid-Base Excess 2.0 0.0 - 2.0 mmol/L   Sodium 137 135 - 145 mmol/L   Potassium 4.6 3.5 - 5.1 mmol/L   Calcium, Ion 1.05 (L) 1.15 - 1.40 mmol/L   HCT 47.0 39.0 - 52.0 %   Hemoglobin 16.0 13.0 - 17.0 g/dL   Patient temperature 78.4 C    Collection site RADIAL, ALLEN'S TEST ACCEPTABLE    Drawn by RT    Sample type ARTERIAL   CG4 I-STAT (Lactic acid)     Status: None   Collection Time: 05/07/23  4:01 PM  Result Value Ref Range   Lactic Acid, Venous 1.4 0.5 - 1.9 mmol/L  Glucose, capillary     Status: Abnormal   Collection Time: 05/07/23  4:46 PM  Result Value Ref Range   Glucose-Capillary 67 (L) 70 - 99 mg/dL    Comment: Glucose reference range applies only to samples taken after fasting for at least 8 hours.  Glucose, capillary     Status: Abnormal   Collection Time: 05/07/23  5:04 PM  Result Value Ref Range   Glucose-Capillary 184 (H) 70 - 99 mg/dL    Comment: Glucose reference range applies only to samples taken after fasting for at least 8 hours.  MRSA Next Gen by PCR, Nasal     Status: None   Collection Time: 05/07/23  5:12 PM   Specimen: Nasal Mucosa; Nasal Swab  Result Value Ref Range   MRSA by PCR Next Gen NOT DETECTED NOT DETECTED    Comment: (NOTE) The GeneXpert MRSA Assay (FDA approved for NASAL specimens only), is one component of a comprehensive MRSA colonization surveillance program. It is not intended to diagnose MRSA infection nor to guide or monitor treatment for MRSA infections. Test performance is not FDA approved in patients less than 61 years old. Performed at [facility], [address], [place], [place] [address]   Cooxemetry Panel (carboxy, met, total hgb, O2 sat)     Status: None   Collection Time: 05/07/23  5:22 PM  Result Value Ref Range   Total hemoglobin 14.4 12.0 - 16.0 g/dL   O2 Saturation 52.8 %   Carboxyhemoglobin 1.4 0.5 - 1.5 %   Methemoglobin 0.7 0.0 - 1.5 %    Comment: Performed at [facility], [address]  [place], [place] [address]  Glucose, capillary  Status: Abnormal   Collection Time: 05/07/23  7:37 PM  Result Value Ref Range   Glucose-Capillary 64 (L) 70 - 99 mg/dL    Comment: Glucose reference range applies only to samples taken after fasting for at least 8 hours.  Glucose, capillary     Status: Abnormal   Collection Time: 05/07/23  7:38 PM  Result Value Ref Range   Glucose-Capillary 63 (L) 70 - 99 mg/dL    Comment: Glucose reference range applies only to samples taken after fasting for at least 8 hours.  Glucose, capillary     Status: None   Collection Time: 05/07/23  8:15 PM  Result Value Ref Range   Glucose-Capillary 87 70 - 99 mg/dL    Comment: Glucose reference range applies only to samples taken after fasting for at least 8 hours.  Glucose, capillary     Status: None   Collection Time: 05/07/23 11:13 PM  Result Value Ref Range   Glucose-Capillary 80 70 - 99 mg/dL    Comment: Glucose reference range applies only to samples taken after fasting for at least 8 hours.  Glucose, capillary     Status: Abnormal   Collection Time: 05/08/23  3:25 AM  Result Value Ref Range   Glucose-Capillary 39 (LL) 70 - 99 mg/dL    Comment: Glucose reference range applies only to samples taken after fasting for at least 8 hours.   Comment 1 Notify RN   Glucose, capillary     Status: Abnormal   Collection Time: 05/08/23  3:27 AM  Result Value Ref Range   Glucose-Capillary 48 (L) 70 - 99 mg/dL    Comment: Glucose reference range applies only to samples taken after fasting for at least 8 hours.  Glucose, capillary     Status: Abnormal   Collection Time: 05/08/23  4:03 AM  Result Value Ref Range   Glucose-Capillary 49 (L) 70 - 99 mg/dL    Comment: Glucose reference range applies only to samples taken after fasting for at least 8 hours.  I-STAT 7, (LYTES, BLD GAS, ICA, H+H)     Status: Abnormal   Collection Time: 05/08/23  4:25 AM  Result Value Ref Range   pH, Arterial 7.306 (L) 7.35 - 7.45   pCO2  arterial 56.8 (H) 32 - 48 mmHg   pO2, Arterial 92 83 - 108 mmHg   Bicarbonate 27.9 20.0 - 28.0 mmol/L   TCO2 30 22 - 32 mmol/L   O2 Saturation 95 %   Acid-Base Excess 1.0 0.0 - 2.0 mmol/L   Sodium 137 135 - 145 mmol/L   Potassium 4.3 3.5 - 5.1 mmol/L   Calcium, Ion 1.01 (L) 1.15 - 1.40 mmol/L   HCT 44.0 39.0 - 52.0 %   Hemoglobin 15.0 13.0 - 17.0 g/dL   Patient temperature 829.5 F    Sample type ARTERIAL   Glucose, capillary     Status: Abnormal   Collection Time: 05/08/23  5:04 AM  Result Value Ref Range   Glucose-Capillary 67 (L) 70 - 99 mg/dL    Comment: Glucose reference range applies only to samples taken after fasting for at least 8 hours.  Basic metabolic panel     Status: Abnormal   Collection Time: 05/08/23  5:08 AM  Result Value Ref Range   Sodium 135 135 - 145 mmol/L   Potassium 4.1 3.5 - 5.1 mmol/L   Chloride 91 (L) 98 - 111 mmol/L   CO2 23 22 - 32 mmol/L   Glucose, Bld 271 (  H) 70 - 99 mg/dL    Comment: Glucose reference range applies only to samples taken after fasting for at least 8 hours.   BUN 42 (H) 6 - 20 mg/dL   Creatinine, Ser 8.31 (H) 0.61 - 1.24 mg/dL   Calcium 6.6 (L) 8.9 - 10.3 mg/dL    Comment: REPEATED TO VERIFY DELTA CHECK NOTED    GFR, Estimated 13 (L) >60 mL/min    Comment: (NOTE) Calculated using the CKD-EPI Creatinine Equation (2021)    Anion gap 21 (H) 5 - 15    Comment: ELECTROLYTES REPEATED TO VERIFY Performed at [facility], [address] [place], [place] [address]   Procalcitonin     Status: None   Collection Time: 05/08/23  5:08 AM  Result Value Ref Range   Procalcitonin 1.09 ng/mL    Comment:        Interpretation: PCT > 0.5 ng/mL and <= 2 ng/mL: Systemic infection (sepsis) is possible, but other conditions are known to elevate PCT as well. (NOTE)       Sepsis PCT Algorithm           Lower Respiratory Tract                                      Infection PCT Algorithm    ----------------------------      ----------------------------         PCT < 0.25 ng/mL                PCT < 0.10 ng/mL          Strongly encourage             Strongly discourage   discontinuation of antibiotics    initiation of antibiotics    ----------------------------     -----------------------------       PCT 0.25 - 0.50 ng/mL            PCT 0.10 - 0.25 ng/mL               OR       >80% decrease in PCT            Discourage initiation of                                            antibiotics      Encourage discontinuation           of antibiotics    ----------------------------     -----------------------------         PCT >= 0.50 ng/mL              PCT 0.26 - 0.50 ng/mL                AND       <80% decrease in PCT             Encourage initiation of                                             antibiotics       Encourage continuation           of antibiotics    ----------------------------     -----------------------------  PCT >= 0.50 ng/mL                  PCT > 0.50 ng/mL               AND         increase in PCT                  Strongly encourage                                      initiation of antibiotics    Strongly encourage escalation           of antibiotics                                     -----------------------------                                           PCT <= 0.25 ng/mL                                                 OR                                        > 80% decrease in PCT                                      Discontinue / Do not initiate                                             antibiotics  Performed at [facility], [address], [place], [place] [address]   Lactic acid, plasma     Status: None   Collection Time: 05/08/23  5:08 AM  Result Value Ref Range   Lactic Acid, Venous 1.7 0.5 - 1.9 mmol/L    Comment: Performed at [facility], [address], [place], [place] [address]  Magnesium     Status: Abnormal   Collection Time: 05/08/23  5:08 AM   Result Value Ref Range   Magnesium 1.5 (L) 1.7 - 2.4 mg/dL    Comment: Performed at [facility], [address] [place], [place] [address]  Cooxemetry Panel (carboxy, met, total hgb, O2 sat)     Status: Abnormal   Collection Time: 05/08/23  5:08 AM  Result Value Ref Range   Total hemoglobin 13.4 12.0 - 16.0 g/dL   O2 Saturation 91.4 %   Carboxyhemoglobin 1.8 (H) 0.5 - 1.5 %   Methemoglobin 0.7 0.0 - 1.5 %    Comment: Performed at [facility], [address] [place], [place] [address]  Lipase, blood     Status: None   Collection Time: 05/08/23  5:08 AM  Result  Value Ref Range   Lipase 24 11 - 51 U/L    Comment: Performed at [facility], [address], [place], [place] [address]  CK total and CKMB (cardiac)not at [facility]     Status: None   Collection Time: 05/08/23  5:08 AM  Result Value Ref Range   Total CK 320 49 - 397 U/L   CK, MB 3.7 0.5 - 5.0 ng/mL    Comment: Performed at [facility], [address], [place], [place] [address]  CBC     Status: Abnormal   Collection Time: 05/08/23  5:08 AM  Result Value Ref Range   WBC 13.2 (H) 4.0 - 10.5 K/uL   RBC 4.95 4.22 - 5.81 MIL/uL   Hemoglobin 13.2 13.0 - 17.0 g/dL   HCT 86.5 78.4 - 69.6 %   MCV 87.3 80.0 - 100.0 fL   MCH 26.7 26.0 - 34.0 pg   MCHC 30.6 30.0 - 36.0 g/dL   RDW 29.5 (H) 28.4 - 13.2 %   Platelets 208 150 - 400 K/uL   nRBC 0.0 0.0 - 0.2 %    Comment: Performed at [facility], [address] [place], [place] [address]  Hepatic function panel     Status: Abnormal   Collection Time: 05/08/23  5:08 AM  Result Value Ref Range   Total Protein 7.6 6.5 - 8.1 g/dL   Albumin 2.7 (L) 3.5 - 5.0 g/dL   AST 2,725 (H) 15 - 41 U/L   ALT 695 (H) 0 - 44 U/L   Alkaline Phosphatase 66 38 - 126 U/L   Total Bilirubin 2.5 (H) 0.0 - 1.2 mg/dL   Bilirubin, Direct 1.2 (H) 0.0 - 0.2 mg/dL   Indirect Bilirubin 1.3 (H) 0.3 - 0.9 mg/dL    Comment: Performed at [facility], [address],  [place], [place] [address]  Glucose, capillary     Status: Abnormal   Collection Time: 05/08/23  5:47 AM  Result Value Ref Range   Glucose-Capillary 100 (H) 70 - 99 mg/dL    Comment: Glucose reference range applies only to samples taken after fasting for at least 8 hours.  Glucose, capillary     Status: Abnormal   Collection Time: 05/08/23  7:54 AM  Result Value Ref Range   Glucose-Capillary 69 (L) 70 - 99 mg/dL    Comment: Glucose reference range applies only to samples taken after fasting for at least 8 hours.     ROS:  Review of systems not obtained due to patient factors.  Physical Exam: Vitals:   05/08/23 0745 05/08/23 0803  BP: 104/60   Pulse: 99 (!) 103  Resp: 20 (!) 24  Temp: (!) 102 F (38.9 C)   SpO2: 94% 93%     General: obese WM, on bipap-  eyes open but no conversatin and no commands-  looks much older than age  HEENT: PERRLA, mucous membranes moist  Neck: positive for JVD Heart: tachy Lungs: CBS bilat Abdomen: distended Extremities: pitting edema to lower abdomen Skin: warm and dry Neuro: not very responsive-   CT negative for bleed or CVA  Assessment/Plan: 58 year old WM with DM and HTN-  poorly compliant at baseline. He now presents with new HFrEF-  has worsened during hospitalization now in ICU-  felt to have combo of cardiogenic and septic shock-  complication of AKI 1.Renal- baseline kidney function appears normal-  crt 1.1 during first 2 days of hospitalization.  Then developed AKI.  His AKI is most likely related to ATN from shock.  However, the hematuria brings to mind possible GN??? Although low probability will send of serologies at least.   Right now is anuric and volume overloaded with pulmonary edema and tenuous pulm status so will support with CRRT. Focused mainly on volume removal.  No AC, 4K - negative 50-100 per hour.  Appreciate CCM placing vascath 2. Hypertension/volume  - very volume overloaded and soft BP in the setting of newly iagnosed heart  failure-  on milrinone and norepi-  per heart failure team 3. Elytes-  low calcium -  2 grams being given today-  K is OK 4. Anemia  - good right now, follow    Cecille Aver 05/08/2023, 8:04 AM

## 2023-05-08 NOTE — Progress Notes (Signed)
SLP Cancellation Note  Patient Details Name: Dylan Owen MRN: [id number] DOB: [birth year]   Cancelled treatment:       Reason Eval/Treat Not Completed: Medical issues which prohibited therapy Pt on BiPAP and RN is not certain if or when he will come off but will notify this SLP if he is able.    Royce Macadamia 05/08/2023, 9:03 AM

## 2023-05-08 NOTE — Progress Notes (Signed)
HYPOGLYCEMIC EVENT  At 1938  patient's CBG was 63 12.5 g D5 given IV.  Repeat CBG was 80.  Will continue to monitor

## 2023-05-08 NOTE — Procedures (Signed)
Central Venous Catheter Insertion Procedure Note  KYRIAKOS BABLER  [id number]  1965-12-21  Date:05/08/23  Time:11:03 AM   Provider Performing:Phinley Schall Salena Saner Katrinka Blazing   Procedure: Insertion of Non-tunneled Central Venous Catheter(36556)with US guidance (91478)    Indication(s) Hemodialysis  Consent Risks of the procedure as well as the alternatives and risks of each were explained to the patient and/or caregiver.  Consent for the procedure was obtained and is signed in the bedside chart  Anesthesia Topical only with 1% lidocaine   Timeout Verified patient identification, verified procedure, site/side was marked, verified correct patient position, special equipment/implants available, medications/allergies/relevant history reviewed, required imaging and test results available.  Sterile Technique Maximal sterile technique including full sterile barrier drape, hand hygiene, sterile gown, sterile gloves, mask, hair covering, sterile ultrasound probe cover (if used).  Procedure Description Area of catheter insertion was cleaned with chlorhexidine and draped in sterile fashion.   With real-time ultrasound guidance a HD catheter was placed into the left internal jugular vein.  Nonpulsatile blood flow and easy flushing noted in all ports.  The catheter was sutured in place and sterile dressing applied.  Complications/Tolerance None; patient tolerated the procedure well. Chest X-ray is ordered to verify placement for internal jugular or subclavian cannulation.  Chest x-ray is not ordered for femoral cannulation.  EBL Minimal  Specimen(s) None

## 2023-05-09 ENCOUNTER — Inpatient Hospital Stay (HOSPITAL_COMMUNITY): Payer: PPO

## 2023-05-09 DIAGNOSIS — J9602 Acute respiratory failure with hypercapnia: Secondary | ICD-10-CM

## 2023-05-09 DIAGNOSIS — A419 Sepsis, unspecified organism: Secondary | ICD-10-CM

## 2023-05-09 DIAGNOSIS — R6521 Severe sepsis with septic shock: Secondary | ICD-10-CM | POA: Diagnosis not present

## 2023-05-09 DIAGNOSIS — E162 Hypoglycemia, unspecified: Secondary | ICD-10-CM

## 2023-05-09 DIAGNOSIS — N179 Acute kidney failure, unspecified: Secondary | ICD-10-CM

## 2023-05-09 DIAGNOSIS — I5021 Acute systolic (congestive) heart failure: Secondary | ICD-10-CM

## 2023-05-09 DIAGNOSIS — I5041 Acute combined systolic (congestive) and diastolic (congestive) heart failure: Secondary | ICD-10-CM | POA: Insufficient documentation

## 2023-05-09 DIAGNOSIS — G9341 Metabolic encephalopathy: Secondary | ICD-10-CM | POA: Diagnosis not present

## 2023-05-09 DIAGNOSIS — J9601 Acute respiratory failure with hypoxia: Secondary | ICD-10-CM | POA: Diagnosis not present

## 2023-05-09 DIAGNOSIS — G9340 Encephalopathy, unspecified: Secondary | ICD-10-CM

## 2023-05-09 LAB — BASIC METABOLIC PANEL WITH GFR
Anion gap: 13 (ref 5–15)
BUN: 35 mg/dL — ABNORMAL HIGH (ref 6–20)
CO2: 22 mmol/L (ref 22–32)
Calcium: 8 mg/dL — ABNORMAL LOW (ref 8.9–10.3)
Chloride: 100 mmol/L (ref 98–111)
Creatinine, Ser: 4.17 mg/dL — ABNORMAL HIGH (ref 0.61–1.24)
GFR, Estimated: 16 mL/min — ABNORMAL LOW
Glucose, Bld: 153 mg/dL — ABNORMAL HIGH (ref 70–99)
Potassium: 4.7 mmol/L (ref 3.5–5.1)
Sodium: 135 mmol/L (ref 135–145)

## 2023-05-09 LAB — MRSA NEXT GEN BY PCR, NASAL: MRSA by PCR Next Gen: NOT DETECTED

## 2023-05-09 LAB — COOXEMETRY PANEL
Carboxyhemoglobin: 1.2 % (ref 0.5–1.5)
Carboxyhemoglobin: 1.6 % — ABNORMAL HIGH (ref 0.5–1.5)
Carboxyhemoglobin: 1.6 % — ABNORMAL HIGH (ref 0.5–1.5)
Methemoglobin: 0.9 % (ref 0.0–1.5)
Methemoglobin: 0.9 % (ref 0.0–1.5)
Methemoglobin: <0.7 % (ref 0.0–1.5)
O2 Saturation: 82.1 %
O2 Saturation: 88.6 %
O2 Saturation: 89.4 %
Total hemoglobin: 13 g/dL (ref 12.0–16.0)
Total hemoglobin: 13.8 g/dL (ref 12.0–16.0)
Total hemoglobin: 14.1 g/dL (ref 12.0–16.0)

## 2023-05-09 LAB — CBC
HCT: 46.8 % (ref 39.0–52.0)
Hemoglobin: 14.3 g/dL (ref 13.0–17.0)
MCH: 26.1 pg (ref 26.0–34.0)
MCHC: 30.6 g/dL (ref 30.0–36.0)
MCV: 85.4 fL (ref 80.0–100.0)
Platelets: 237 10*3/uL (ref 150–400)
RBC: 5.48 MIL/uL (ref 4.22–5.81)
RDW: 16 % — ABNORMAL HIGH (ref 11.5–15.5)
WBC: 13 10*3/uL — ABNORMAL HIGH (ref 4.0–10.5)
nRBC: 0 % (ref 0.0–0.2)

## 2023-05-09 LAB — POCT I-STAT 7, (LYTES, BLD GAS, ICA,H+H)
Acid-Base Excess: 1 mmol/L (ref 0.0–2.0)
Acid-base deficit: 3 mmol/L — ABNORMAL HIGH (ref 0.0–2.0)
Bicarbonate: 23.4 mmol/L (ref 20.0–28.0)
Bicarbonate: 25.8 mmol/L (ref 20.0–28.0)
Calcium, Ion: 1.04 mmol/L — ABNORMAL LOW (ref 1.15–1.40)
Calcium, Ion: 1.13 mmol/L — ABNORMAL LOW (ref 1.15–1.40)
HCT: 48 % (ref 39.0–52.0)
HCT: 45 % (ref 39.0–52.0)
Hemoglobin: 16.3 g/dL (ref 13.0–17.0)
Hemoglobin: 15.3 g/dL (ref 13.0–17.0)
O2 Saturation: 97 %
O2 Saturation: 100 %
Patient temperature: 38.3
Patient temperature: 98.8
Potassium: 4.6 mmol/L (ref 3.5–5.1)
Potassium: 4.6 mmol/L (ref 3.5–5.1)
Sodium: 137 mmol/L (ref 135–145)
Sodium: 133 mmol/L — ABNORMAL LOW (ref 135–145)
TCO2: 25 mmol/L (ref 22–32)
TCO2: 27 mmol/L (ref 22–32)
pCO2 arterial: 48.1 mm[Hg] — ABNORMAL HIGH (ref 32–48)
pCO2 arterial: 41.4 mm[Hg] (ref 32–48)
pH, Arterial: 7.302 — ABNORMAL LOW (ref 7.35–7.45)
pH, Arterial: 7.403 (ref 7.35–7.45)
pO2, Arterial: 109 mm[Hg] — ABNORMAL HIGH (ref 83–108)
pO2, Arterial: 282 mm[Hg] — ABNORMAL HIGH (ref 83–108)

## 2023-05-09 LAB — RENAL FUNCTION PANEL
Albumin: 2.4 g/dL — ABNORMAL LOW (ref 3.5–5.0)
Anion gap: 14 (ref 5–15)
BUN: 30 mg/dL — ABNORMAL HIGH (ref 6–20)
CO2: 19 mmol/L — ABNORMAL LOW (ref 22–32)
Calcium: 8.1 mg/dL — ABNORMAL LOW (ref 8.9–10.3)
Chloride: 102 mmol/L (ref 98–111)
Creatinine, Ser: 3.23 mg/dL — ABNORMAL HIGH (ref 0.61–1.24)
GFR, Estimated: 21 mL/min — ABNORMAL LOW (ref >60)
Glucose, Bld: 145 mg/dL — ABNORMAL HIGH (ref 70–99)
Phosphorus: 3.1 mg/dL (ref 2.5–4.6)
Potassium: 4.9 mmol/L (ref 3.5–5.1)
Sodium: 135 mmol/L (ref 135–145)

## 2023-05-09 LAB — GLUCOSE, CAPILLARY
Glucose-Capillary: 151 mg/dL — ABNORMAL HIGH (ref 70–99)
Glucose-Capillary: 154 mg/dL — ABNORMAL HIGH (ref 70–99)
Glucose-Capillary: 228 mg/dL — ABNORMAL HIGH (ref 70–99)
Glucose-Capillary: 142 mg/dL — ABNORMAL HIGH (ref 70–99)
Glucose-Capillary: 70 mg/dL (ref 70–99)
Glucose-Capillary: 501 mg/dL (ref 70–99)
Glucose-Capillary: 147 mg/dL — ABNORMAL HIGH (ref 70–99)
Glucose-Capillary: 24 mg/dL — CL (ref 70–99)
Glucose-Capillary: 147 mg/dL — ABNORMAL HIGH (ref 70–99)
Glucose-Capillary: 152 mg/dL — ABNORMAL HIGH (ref 70–99)
Glucose-Capillary: 152 mg/dL — ABNORMAL HIGH (ref 70–99)

## 2023-05-09 LAB — MAGNESIUM: Magnesium: 2.1 mg/dL (ref 1.7–2.4)

## 2023-05-09 LAB — HEPATIC FUNCTION PANEL
ALT: 686 U/L — ABNORMAL HIGH (ref 0–44)
AST: 1458 U/L — ABNORMAL HIGH (ref 15–41)
Albumin: 2.9 g/dL — ABNORMAL LOW (ref 3.5–5.0)
Alkaline Phosphatase: 81 U/L (ref 38–126)
Bilirubin, Direct: 1.7 mg/dL — ABNORMAL HIGH (ref 0.0–0.2)
Indirect Bilirubin: 1.2 mg/dL — ABNORMAL HIGH (ref 0.3–0.9)
Total Bilirubin: 2.9 mg/dL — ABNORMAL HIGH (ref 0.0–1.2)
Total Protein: 6.9 g/dL (ref 6.5–8.1)

## 2023-05-09 LAB — PHOSPHORUS: Phosphorus: 4.4 mg/dL (ref 2.5–4.6)

## 2023-05-09 LAB — LACTIC ACID, PLASMA: Lactic Acid, Venous: 2.8 mmol/L (ref 0.5–1.9)

## 2023-05-09 LAB — CG4 I-STAT (LACTIC ACID): Lactic Acid, Venous: 1.4 mmol/L (ref 0.5–1.9)

## 2023-05-09 LAB — AMMONIA: Ammonia: <10 umol/L (ref 9–35)

## 2023-05-09 MED ORDER — SODIUM BICARBONATE 8.4 % IV SOLN: 100.0000 meq | Freq: Once | INTRAVENOUS | Status: AC

## 2023-05-09 MED ORDER — NOREPINEPHRINE 16 MG/250ML-% IV SOLN: 0.0000 ug/min | INTRAVENOUS | Status: DC

## 2023-05-09 MED ORDER — SUCCINYLCHOLINE CHLORIDE 200 MG/10ML IV SOSY
PREFILLED_SYRINGE | INTRAVENOUS | Status: AC
  Filled 2023-05-09: qty 10

## 2023-05-09 MED ORDER — FENTANYL 2500MCG IN NS 250ML (10MCG/ML) PREMIX INFUSION
50.0000 ug/h | INTRAVENOUS | Status: DC
  Administered 2023-05-09 – 2023-05-10 (×2): 50 ug/h via INTRAVENOUS
  Filled 2023-05-09 (×2): qty 250

## 2023-05-09 MED ORDER — ETOMIDATE 2 MG/ML IV SOLN
INTRAVENOUS | Status: AC
  Administered 2023-05-09: 20 mg
  Filled 2023-05-09: qty 20

## 2023-05-09 MED ORDER — VITAL 1.5 CAL PO LIQD
1000.0000 mL | ORAL | Status: DC
Start: 2023-05-09 — End: 2023-05-10
  Administered 2023-05-09: 1000 mL

## 2023-05-09 MED ORDER — ORAL CARE MOUTH RINSE
15.0000 mL | OROMUCOSAL | Status: DC
  Administered 2023-05-09 – 2023-05-11 (×22): 15 mL via OROMUCOSAL

## 2023-05-09 MED ORDER — VASOPRESSIN 20 UNITS/100 ML INFUSION FOR SHOCK
0.0000 [IU]/min | INTRAVENOUS | Status: DC
  Administered 2023-05-10: 0.02 [IU]/min via INTRAVENOUS
  Filled 2023-05-09: qty 100

## 2023-05-09 MED ORDER — ACETAMINOPHEN 10 MG/ML IV SOLN
1000.0000 mg | Freq: Four times a day (QID) | INTRAVENOUS | Status: AC
  Administered 2023-05-09 – 2023-05-10 (×4): 1000 mg via INTRAVENOUS
  Filled 2023-05-09 (×4): qty 100

## 2023-05-09 MED ORDER — MIDAZOLAM HCL 2 MG/2ML IJ SOLN
INTRAMUSCULAR | Status: AC
  Filled 2023-05-09: qty 2

## 2023-05-09 MED ORDER — DOCUSATE SODIUM 50 MG/5ML PO LIQD
100.0000 mg | Freq: Two times a day (BID) | ORAL | Status: DC
  Administered 2023-05-09 – 2023-05-10 (×4): 100 mg
  Filled 2023-05-09 (×4): qty 10

## 2023-05-09 MED ORDER — HEPARIN SODIUM (PORCINE) 5000 UNIT/ML IJ SOLN
5000.0000 [IU] | Freq: Three times a day (TID) | INTRAMUSCULAR | Status: DC
  Administered 2023-05-10 – 2023-05-13 (×10): 5000 [IU] via SUBCUTANEOUS
  Filled 2023-05-09 (×10): qty 1

## 2023-05-09 MED ORDER — FAMOTIDINE 20 MG PO TABS
20.0000 mg | ORAL_TABLET | Freq: Two times a day (BID) | ORAL | Status: DC
  Administered 2023-05-09 – 2023-05-10 (×4): 20 mg
  Filled 2023-05-09 (×4): qty 1

## 2023-05-09 MED ORDER — KETAMINE HCL 50 MG/5ML IJ SOSY
PREFILLED_SYRINGE | INTRAMUSCULAR | Status: AC
  Filled 2023-05-09: qty 10

## 2023-05-09 MED ORDER — DEXMEDETOMIDINE HCL IN NACL 400 MCG/100ML IV SOLN
0.0000 ug/kg/h | INTRAVENOUS | Status: DC
  Administered 2023-05-09: 0.4 ug/kg/h via INTRAVENOUS
  Administered 2023-05-09 (×2): 0.7 ug/kg/h via INTRAVENOUS
  Administered 2023-05-10: 0.5 ug/kg/h via INTRAVENOUS
  Administered 2023-05-10: 0.7 ug/kg/h via INTRAVENOUS
  Administered 2023-05-10: 0.4 ug/kg/h via INTRAVENOUS
  Administered 2023-05-11 (×2): 0.6 ug/kg/h via INTRAVENOUS
  Filled 2023-05-09 (×10): qty 100

## 2023-05-09 MED ORDER — VASOPRESSIN 20 UNITS/100 ML INFUSION FOR SHOCK
INTRAVENOUS | Status: AC
  Administered 2023-05-09: 0.03 [IU]/min via INTRAVENOUS
  Filled 2023-05-09: qty 100

## 2023-05-09 MED ORDER — HYDROCORTISONE SOD SUC (PF) 100 MG IJ SOLR
100.0000 mg | Freq: Two times a day (BID) | INTRAMUSCULAR | Status: DC
  Administered 2023-05-09 – 2023-05-11 (×4): 100 mg via INTRAVENOUS
  Filled 2023-05-09 (×4): qty 2

## 2023-05-09 MED ORDER — NOREPINEPHRINE 4 MG/250ML-% IV SOLN: 8.0000 ug/min | Freq: Once | INTRAVENOUS | Status: DC

## 2023-05-09 MED ORDER — POLYETHYLENE GLYCOL 3350 17 G PO PACK
17.0000 g | PACK | Freq: Every day | ORAL | Status: DC
  Administered 2023-05-09 – 2023-05-10 (×2): 17 g
  Filled 2023-05-09 (×2): qty 1

## 2023-05-09 MED ORDER — ROCURONIUM BROMIDE 10 MG/ML (PF) SYRINGE
PREFILLED_SYRINGE | INTRAVENOUS | Status: AC
  Administered 2023-05-09: 100 mg
  Filled 2023-05-09: qty 10

## 2023-05-09 MED ORDER — SODIUM BICARBONATE 8.4 % IV SOLN
INTRAVENOUS | Status: AC
  Administered 2023-05-09: 100 meq via INTRAVENOUS
  Filled 2023-05-09: qty 100

## 2023-05-09 MED ORDER — FENTANYL CITRATE PF 50 MCG/ML IJ SOSY
PREFILLED_SYRINGE | INTRAMUSCULAR | Status: AC
  Administered 2023-05-09: 100 ug
  Filled 2023-05-09: qty 2

## 2023-05-09 MED ORDER — CALCIUM GLUCONATE-NACL 2-0.675 GM/100ML-% IV SOLN
2.0000 g | Freq: Once | INTRAVENOUS | Status: AC
  Administered 2023-05-09: 2000 mg via INTRAVENOUS
  Filled 2023-05-09: qty 100

## 2023-05-09 MED ORDER — RENA-VITE PO TABS
1.0000 | ORAL_TABLET | Freq: Every day | ORAL | Status: DC
  Administered 2023-05-09 – 2023-05-11 (×3): 1
  Filled 2023-05-09 (×3): qty 1

## 2023-05-09 MED ORDER — MIDAZOLAM HCL 2 MG/2ML IJ SOLN
1.0000 mg | INTRAMUSCULAR | Status: DC | PRN
Start: 2023-05-09 — End: 2023-05-11
  Administered 2023-05-10: 2 mg via INTRAVENOUS
  Filled 2023-05-09: qty 2

## 2023-05-09 MED ORDER — FENTANYL CITRATE PF 50 MCG/ML IJ SOSY
50.0000 ug | PREFILLED_SYRINGE | Freq: Once | INTRAMUSCULAR | Status: AC
Start: 2023-05-09 — End: 2023-05-09
  Administered 2023-05-09: 50 ug via INTRAVENOUS
  Filled 2023-05-09: qty 1

## 2023-05-09 MED ORDER — ORAL CARE MOUTH RINSE: 15.0000 mL | OROMUCOSAL | Status: DC | PRN

## 2023-05-09 MED ORDER — FENTANYL BOLUS VIA INFUSION
50.0000 ug | INTRAVENOUS | Status: DC | PRN
  Administered 2023-05-09: 50 ug via INTRAVENOUS
  Administered 2023-05-10: 100 ug via INTRAVENOUS

## 2023-05-09 MED ORDER — VANCOMYCIN HCL 1500 MG/300ML IV SOLN
1500.0000 mg | INTRAVENOUS | Status: DC
  Administered 2023-05-10: 1500 mg via INTRAVENOUS
  Filled 2023-05-09: qty 300

## 2023-05-09 MED ORDER — VANCOMYCIN HCL 2000 MG/400ML IV SOLN
2000.0000 mg | Freq: Once | INTRAVENOUS | Status: AC
  Administered 2023-05-09: 2000 mg via INTRAVENOUS
  Filled 2023-05-09: qty 400

## 2023-05-09 MED ORDER — PIVOT 1.5 CAL PO LIQD: 1000.0000 mL | ORAL | Status: DC

## 2023-05-09 NOTE — Procedures (Signed)
Intubation Procedure Note  Dylan Owen  [id number]  1965/11/01  Date:05/09/23  Time:12:03 PM   Provider Performing:Khushbu Pippen E Chablis Losh    Procedure: Intubation (31500)  Indication(s) Respiratory Failure  Consent Risks of the procedure as well as the alternatives and risks of each were explained to the patient and/or caregiver.  Consent for the procedure was obtained and is signed in the bedside chart   Anesthesia Etomidate, Fentanyl, and Rocuronium   Time Out Verified patient identification, verified procedure, site/side was marked, verified correct patient position, special equipment/implants available, medications/allergies/relevant history reviewed, required imaging and test results available.   Sterile Technique Usual hand hygeine, masks, and gloves were used   Procedure Description Patient positioned in bed supine.  Sedation given as noted above -- I also asked [pre-procedurally to increase NE infusion to in anticipation of possible sedation related hypotension.  Patient was intubated with endotracheal tube using Glidescope.  View was Grade 2 only posterior commissure .  Number of attempts was 1.  Colorimetric CO2 detector was consistent with tracheal placement. Dry posterior oropharynx, some thick dry secretions were noted.    Complications/Tolerance None; patient tolerated the procedure well. Chest X-ray is ordered to verify placement.    EBL None    Specimen(s) None   Dylan Fass MSN, AGACNP-BC [facility] Pulmonary/Critical Care Medicine Amion for pager  05/09/2023, 12:03 PM

## 2023-05-09 NOTE — Progress Notes (Addendum)
Advanced Heart Failure Rounding Note  HF Cardiologist: Dr. Elwyn Lade   Chief Complaint: Mixed Cardiogenic and Septic Shock    Patient Profile   58 y.o. male with a PMH of obesity, HTN, HLD, DM2, and OSA admitted w/ mixed septic and cardiogenic shock>>progression to anuric renal failure requiring CRRT.  Interval Hx:    Milrinone stopped overnight due to frequent ectopy ~ 1AM.  Repeat Co-ox at 5 am was 89% but LA up 1.7>>2.8.   Febrile overnight, mTemp 101.3    Subjective:    Remains altered/delirious. On BiPAP. On Meropenum and NE at 8.   Remains anuric. On CRRT currently pulling 100-150 cc/hr. CVP 14.   Repeat co-ox pending.   Objective:   Weight Range: (!) 141.6 kg Body mass index is 41.19 kg/m.   Vital Signs:   Temp:  [99.3 F (37.4 C)-103.1 F (39.5 C)] 101.1 F (38.4 C) (02/03 0815) Pulse Rate:  [51-124] 112 (02/03 0815) Resp:  [8-33] 20 (02/03 0815) BP: (75-131)/(15-119) 104/66 (02/03 0734) SpO2:  [92 %-100 %] 99 % (02/03 0815) FiO2 (%):  [40 %] 40 % (02/03 0734) Weight:  [141.6 kg] 141.6 kg (02/03 0600) Last BM Date : 05/08/23  Weight change: Filed Weights   05/07/23 0500 05/08/23 0455 05/09/23 0600  Weight: (!) 145.7 kg (!) 146.5 kg (!) 141.6 kg    Intake/Output:   Intake/Output Summary (Last 24 hours) at 05/09/2023 0824 Last data filed at 05/09/2023 0800 Gross per 24 hour  Intake 2046.11 ml  Output 3893 ml  Net -1846.89 ml      Physical Exam    General:  obese male, chronically ill appearing, altered/delirious, on BiPAP and shivering  HEENT: on bipap, dentition in poor repair  Neck: supple. Thick neck, JVD not well visualized. Carotids 2+ bilat; no bruits. No lymphadenopathy or thyromegaly appreciated. Cor: PMI nondisplaced. Regular rhythm and tachy rate. No rubs, gallops or murmurs. Lungs: course anteriorly/bilaterally  Abdomen: obese, soft, nontender, nondistended. No hepatosplenomegaly. No bruits or masses. Good bowel  sounds. Extremities: no cyanosis, clubbing, rash, 1+ b/l LE edema + SCDs Neuro: altered/delirious. Not following commands   Labs    CBC Recent Labs    05/06/23 2226 05/07/23 0059 05/08/23 0508 05/08/23 1620 05/09/23 0509 05/09/23 0515  WBC 12.8*   < > 13.2*  --   --  13.0*  NEUTROABS 9.5*  --   --   --   --   --   HGB 16.8   < > 13.2   < > 16.3 14.3  HCT 57.6*   < > 43.2   < > 48.0 46.8  MCV 89.6   < > 87.3  --   --  85.4  PLT 316   < > 208  --   --  237   < > = values in this interval not displayed.   Basic Metabolic Panel Recent Labs    09/81/19 0508 05/08/23 1600 05/08/23 1620 05/09/23 0509 05/09/23 0514  NA 135 138   < > 137 135  K 4.1 4.5   < > 4.6 4.7  CL 91* 97*  --   --  100  CO2 23 24  --   --  22  GLUCOSE 271* 105*  --   --  153*  BUN 42* 46*  --   --  35*  CREATININE 5.05* 5.22*  --   --  4.17*  CALCIUM 6.6* 7.5*  --   --  8.0*  MG 1.5*  --   --   --  2.1  PHOS  --  5.5*  --   --  4.4   < > = values in this interval not displayed.   Liver Function Tests Recent Labs    05/08/23 1600 05/09/23 0514  AST 1,882* 1,458*  ALT 786* 686*  ALKPHOS 81 81  BILITOT 2.9* 2.9*  PROT 7.0 6.9  ALBUMIN 3.0*  3.0* 2.9*   Recent Labs    05/08/23 0508  LIPASE 24   Cardiac Enzymes Recent Labs    05/08/23 0508  CKTOTAL 320  CKMB 3.7    BNP: BNP (last 3 results) Recent Labs    05/04/23 1645  BNP 1,275.2*    ProBNP (last 3 results) No results for input(s): "PROBNP" in the last 8760 hours.   D-Dimer No results for input(s): "DDIMER" in the last 72 hours. Hemoglobin A1C No results for input(s): "HGBA1C" in the last 72 hours. Fasting Lipid Panel Recent Labs    05/07/23 0059  CHOL 120  HDL 21*  LDLCALC 84  TRIG 74  CHOLHDL 5.7   Thyroid Function Tests No results for input(s): "TSH", "T4TOTAL", "T3FREE", "THYROIDAB" in the last 72 hours.  Invalid input(s): "FREET3"  Other results:    Medications:     Scheduled Medications:   aspirin  81 mg Oral Daily   Chlorhexidine Gluconate Cloth  6 each Topical Daily   enoxaparin (LOVENOX) injection  70 mg Subcutaneous Q24H   mouth rinse  15 mL Mouth Rinse 4 times per day   pantoprazole (PROTONIX) IV  40 mg Intravenous Q24H   sodium chloride flush  10-40 mL Intracatheter Q12H    Infusions:   prismasol BGK 4/2.5 400 mL/hr at 05/09/23 0110    prismasol BGK 4/2.5 400 mL/hr at 05/09/23 0103   sodium chloride     calcium gluconate     meropenem (MERREM) IV Stopped (05/09/23 0558)   norepinephrine (LEVOPHED) Adult infusion 8 mcg/min (05/09/23 0800)   prismasol BGK 4/2.5 1,500 mL/hr at 05/09/23 0528    PRN Medications: Place/Maintain arterial line **AND** sodium chloride, acetaminophen **OR** acetaminophen, albuterol, fentaNYL (SUBLIMAZE) injection, heparin, nitroGLYCERIN, mouth rinse, mouth rinse, sodium chloride, sodium chloride flush    Assessment/Plan   Mixed cardiogenic/septic shock: Presented febrile, cold on exam, with worsening blood pressure. Septic component worsening based on exam and blood pressure. Now off Milrinone (stopped earlier this morning w/ frequent ectopy). Repeat Co-ox 89% and repeat LA now up from 1.2>>2.8 in setting of persistent fevers, mTemp 101.3 overnight, c/w now predominant septic shock  - broaden abx coverage, add Vanc to meropenum  - continue NE, maintain MAP > 65  - follow LA trends  - discuss abdominal CT w/ CCM  - continue to follow co-ox off milrinone support  - CMR/coronary evaluation when recovered - CRP/ESR only mildly elevated, doubt myocarditis as initially suspected    Acute hypoxic and hypercarbic respiratory failure: Currently obtunded on BiPAP, protecting airway.  Secondary to ongoing infection, acute heart failure. - Continue positive pressure support. Management per CCM  - continue abx  - CRRT for volume removal  - check NH3    AKI: Suspect due to ongoing shock. Remains anuric.  - continue CRRT, nephrology following     Shock liver: - LFTs remain elevated but downtrending  - Shock management per above   Diabetes:  poorly controlled,  A1c 10.2   - insulin management per CCM   CRITICAL CARE Performed by: Robbie Lis   Total critical care time: 20 minutes  Critical care time was  exclusive of separately billable procedures and treating other patients.  Critical care was necessary to treat or prevent imminent or life-threatening deterioration.  Critical care was time spent personally by me on the following activities: development of treatment plan with patient and/or surrogate as well as nursing, discussions with consultants, evaluation of patient's response to treatment, examination of patient, obtaining history from patient or surrogate, ordering and performing treatments and interventions, ordering and review of laboratory studies, ordering and review of radiographic studies, pulse oximetry and re-evaluation of patient's condition.    Length of Stay: [address] [place]  05/09/2023, 8:24 AM  Advanced Heart Failure Team Pager [phone] (M-F; 7a - 5p)  Please contact [facility] for night-coverage after hours (5p -7a ) and weekends on [email or web address]

## 2023-05-09 NOTE — Progress Notes (Signed)
Sputum, sample collected ans sent to the lab.

## 2023-05-09 NOTE — Progress Notes (Signed)
eLink Physician-Brief Progress Note Patient Name: Dylan Owen DOB: Jun 17, 1965 MRN: [id number]   Date of Service  05/09/2023  HPI/Events of Note  Milrinone stopped earlier due to increased ectopy.  Lactate up trended minimally.  LFTs are downtrending.  Central saturation 90%.  eICU Interventions  Ongoing vasodilatory component of shock.  Continue observation.  No intervention at this time     Intervention Category Minor Interventions: Routine modifications to care plan (e.g. PRN medications for pain, fever)  Tena Linebaugh 05/09/2023, 6:37 AM

## 2023-05-09 NOTE — Progress Notes (Signed)
Pt transported from 2H05 to CT02 and back by RN, RT and transport w/o complications.

## 2023-05-09 NOTE — TOC Initial Note (Signed)
Transition of Care [facility]) - Initial/Assessment Note    Patient Details  Name: Dylan Owen MRN: [id number] Date of Birth: 1965-05-08  Transition of Care [facility]) CM/SW Contact:    Elliot Cousin, RN Phone Number: [phone] 05/09/2023, 4:58 PM  Clinical Narrative:                 TOC CM spoke to wife, and states pt was independent pta. States he drove to appts.  Will continue to follow for dc needs.   Expected Discharge Plan: Long Term Acute Care (LTAC) Barriers to Discharge: Continued Medical Work up   Patient Goals and CMS Choice Patient states their goals for this hospitalization and ongoing recovery are:: wants husband to recover          Expected Discharge Plan and Services   Discharge Planning Services: CM Consult   Living arrangements for the past 2 months: Single Family Home                                      Prior Living Arrangements/Services Living arrangements for the past 2 months: Single Family Home Lives with:: Spouse                   Activities of Daily Living   ADL Screening (condition at time of admission) Independently performs ADLs?: Yes (appropriate for developmental age) Is the patient deaf or have difficulty hearing?: No Does the patient have difficulty seeing, even when wearing glasses/contacts?: No Does the patient have difficulty concentrating, remembering, or making decisions?: No  Permission Sought/Granted                  Emotional Assessment   Attitude/Demeanor/Rapport: Intubated (Following Commands or Not Following Commands)          Admission diagnosis:  Elevated troponin [R79.89] Acute CHF (congestive heart failure) (HCC) [I50.9] Acute congestive heart failure, unspecified heart failure type (HCC) [I50.9] Patient Active Problem List   Diagnosis Date Noted   Acute HFrEF (heart failure with reduced ejection fraction) (HCC) 05/09/2023   Encephalopathy acute 05/09/2023   Septic shock (HCC)  05/08/2023   AKI (acute kidney injury) (HCC) 05/08/2023   Acute respiratory failure with hypoxia (HCC) 05/08/2023   Acute CHF (congestive heart failure) (HCC) 05/05/2023   Depression 05/05/2023   Diabetes mellitus with complication (HCC) 10/20/2015   Type II or unspecified type diabetes mellitus with peripheral circulatory disorders, uncontrolled(250.72) 11/23/2013   Hyperlipidemia 11/23/2013   Diminished pulses in lower extremity 11/23/2013   Obesity 08/12/2011   ALLERGIC RHINITIS 01/28/2010   DEPRESSION 06/26/2009   UNSPECIFIED VITAMIN D DEFICIENCY 07/04/2007   INTERTRIGO, CANDIDAL 01/03/2007   PERIPHERAL EDEMA 10/27/2006   SLEEP APNEA, OBSTRUCTIVE 09/14/2006   DIABETES MELLITUS, TYPE II 06/14/2006   TOBACCO ABUSE 06/14/2006   PERIPHERAL NEUROPATHY 06/14/2006   Essential hypertension 06/14/2006   ANGINA PECTORIS 06/14/2006   GERD 06/14/2006   LOW BACK PAIN 06/14/2006   ANEMIA, VITAMIN B12 DEFICIENCY NEC 05/17/2006   PCP:  Jeoffrey Massed, MD Pharmacy:   [id number] - [place], [place] - [address] [place] [address] Phone: [phone] Fax: [phone]  [facility] [address] [place] [address] Phone: [phone] Fax: [phone]     Social Drivers of Health (SDOH) Social History: SDOH Screenings   Food  Insecurity: No Food Insecurity (05/05/2023)  Recent Concern: Food Insecurity - Food Insecurity Present (05/05/2023)  Housing: Low Risk  (05/05/2023)  Transportation Needs: No Transportation Needs (05/05/2023)  Utilities: Not At Risk (05/05/2023)  Alcohol Screen: Low Risk  (11/05/2020)  Depression (PHQ2-9): High Risk (11/26/2022)  Financial Resource Strain: High Risk (12/05/2022)  Physical Activity: Unknown (12/05/2022)  Social Connections: Moderately Isolated (05/05/2023)  Stress: Stress Concern Present (12/05/2022)  Tobacco Use: High Risk  (05/05/2023)   SDOH Interventions:     Readmission Risk Interventions     No data to display

## 2023-05-09 NOTE — Progress Notes (Signed)
Initial Nutrition Assessment  DOCUMENTATION CODES:   Not applicable  INTERVENTION:   Tube Feeding Recommendations: Vital 1.5 at trickle rate only today (20 ml/hr)  Goal: Vital 1.5 at 50 ml/hr with Pro-Source TF20 60 mL QID  Begin TF at 20 ml/hr, titrate by 10 mL q 8 hours until goal rate of 50 ml/hr TF at goal provides 2120 kcals, 161 g of protein and 912 mL of free water  Add Renal MVI daily  NUTRITION DIAGNOSIS:   Inadequate oral intake related to acute illness as evidenced by NPO status.  GOAL:   Patient will meet greater than or equal to 90% of their needs  MONITOR:   TF tolerance, Vent status, Labs, Weight trends  REASON FOR ASSESSMENT:   Consult Assessment of nutrition requirement/status (CRRT)  ASSESSMENT:   58 yo male admitted with new HFrEF and developed cardiogenic and septic shock requiring transfer to ICU, vasopressors and progressive anuric AKI initiation of CRRT, +shock liver. PMH includes HTN, DM, obesity, OSA, B12 deficiency, +smokeless tobacco use  1/30 Admitted 2/01 Transferred to ICU 2/02 CRRT initiated 2/03 Intubated  Remains on CRRT, anuric, levophed down to 7, off milrinone  NPO, on BiPap during RD visit-awake and moaning but later intubated, pt's wife and pt's father at bedside. Wife tearful, did not attempt to obtain much nutrition history at this time other than pt not eating well or feeling well a few days PTA.   Noted plan for CT C/A/P today; discussed poc with PCCM and pending results of CT, plan to initiate EN  Weight down to 141.6 kg from 146.5 kg post CRRT initiation. Significant edema on exam; current dry weight not known  Ammonia down to 10 from 40  Labs: sodium 135 (wdl), potassium 4.7 (wdl), phosphorus 4.4 (wdl), magnesium 2.1 (wdl), elevated LFTs, CBGs 098-119 Meds: miralax, colace   NUTRITION - FOCUSED PHYSICAL EXAM:  Flowsheet Row Most Recent Value  Orbital Region Unable to assess  [Bipap mask]  Upper Arm Region No  depletion  Buccal Region Unable to assess  Temple Region No depletion  Clavicle Bone Region No depletion  Clavicle and Acromion Bone Region No depletion  Scapular Bone Region No depletion  Dorsal Hand Unable to assess  Patellar Region Unable to assess  Anterior Thigh Region Unable to assess  Posterior Calf Region Unable to assess  Edema (RD Assessment) Severe       Diet Order:   Diet Order             Diet NPO time specified  Diet effective now                   EDUCATION NEEDS:   Not appropriate for education at this time  Skin:  Skin Assessment: Reviewed RN Assessment  Last BM:  2/03 type 6 large  Height:   Ht Readings from Last 1 Encounters:  05/09/23 6\' 1"  (1.854 m)    Weight:   Wt Readings from Last 1 Encounters:  05/09/23 (!) 141.6 kg     BMI:  Body mass index is 41.19 kg/m.  Estimated Nutritional Needs:   Kcal:  2100-2400 kcals  Protein:  150-180 g  Fluid:  1.5 L   Romelle Starcher MS, RDN, LDN, CNSC Registered Dietitian 3 Clinical Nutrition RD Inpatient Contact Info in Amion

## 2023-05-09 NOTE — Progress Notes (Addendum)
Pharmacy Antibiotic Note  Dylan Owen is a 58 y.o. male admitted on 05/04/2023 with sepsis.  Pharmacy has been consulted for vancomycin and meropenem dosing.  Changed from zosyn to meropenem yesterday. WBC remains at 13, LA down 2.8 to 1.4. Still having febrile on CRRT - starting IV tylenol for 24 hours.   Plan: Continue meropenem 1g IV every 8 hours Order vancomycin 2g IV once then 1500 mg IV every 24 hours  Monitor CRRT plans, clinical pic, cx results, LOT  Height: 6\' 1"  (185.4 cm) Weight: (!) 141.6 kg (312 lb 2.7 oz) IBW/kg (Calculated) : 79.9  Temp (24hrs), Avg:100.9 F (38.3 C), Min:98.7 F (37.1 C), Max:102 F (38.9 C)  Recent Labs  Lab 05/06/23 0504 05/06/23 2037 05/06/23 2226 05/07/23 0059 05/07/23 0527 05/07/23 1601 05/08/23 0508 05/08/23 1600 05/09/23 0514 05/09/23 0515 05/09/23 1210  WBC 9.9  --  12.8* 13.4*  --   --  13.2*  --   --  13.0*  --   CREATININE 1.57*   < > 2.82* 2.83*  --   --  5.05* 5.22* 4.17*  --   --   LATICACIDVEN  --    < > 3.7* 3.8* 3.3* 1.4 1.7  --  2.8*  --  1.4   < > = values in this interval not displayed.    Estimated Creatinine Clearance: 28.9 mL/min (A) (by C-G formula based on SCr of 4.17 mg/dL (H)).    Allergies  Allergen Reactions   Ace Inhibitors     Kidney failure   Atorvastatin Other (See Comments)    Arm pain   Prednisone     REACTION: Anxiety   Septra [Sulfamethoxazole-Trimethoprim]    Metformin And Related Diarrhea    Antimicrobials this admission: Cefepime/Flagyl x1 Zosyn 2/1> 2/2 Meropenem 2/2 >> Vanc 2/3>>  Dose adjustments this admission: N/A  Microbiology results: 1/31 Resp PCR: neg  1/31 BCx: ngtd 2/1 Ucx: suggest recollect 2/1 MRSA PCR; neg   Thank you for allowing pharmacy to participate in this patient's care,  Sherron Monday, PharmD, BCCCP Clinical Pharmacist  Phone: [phone] 05/09/2023 3:40 PM  Please check AMION for all [facility] Pharm[facility] phone numbers After 10:00 PM, call Main Pharmacy  [phone]

## 2023-05-09 NOTE — Progress Notes (Signed)
Patient ID: Dylan Owen, male   DOB: 1965/07/03, 58 y.o.   MRN: [id number] [facility] Progress Note   Assessment/ Plan:   1. Acute kidney Injury: With essentially normal renal function at baseline (creatinine 1.1) and currently anuric.  His data appears to be most consistent with ATN from shock however, presence of hematuria raise concern for GN for which serologies were requested/pending at this time.  Continue CRRT at this time for management of azotemia and volume unloading. 2.  Mixed cardiogenic/septic shock: Clinically suspected to be associated with viral myocarditis/acute CHF exacerbation. 3.  Shock liver: Transaminases appear to be trending down with hemodynamic support.  Remains on norepinephrine drip. 4.  Acute hypoxic/hypercarbic respiratory failure: Remains on noninvasive positive pressure ventilation with BiPAP; continue efforts at volume unloading with CRRT.  Subjective:   Weaned off of milrinone overnight with development of increased ectopy overnight.   Objective:   BP 104/66   Pulse (!) 106   Temp (!) 100.8 F (38.2 C)   Resp (!) 24   Ht 6\' 1"  (1.854 m)   Wt (!) 141.6 kg   SpO2 98%   BMI 41.19 kg/m   Intake/Output Summary (Last 24 hours) at 05/09/2023 0742 Last data filed at 05/09/2023 0700 Gross per 24 hour  Intake 2000.1 ml  Output 3713 ml  Net -1712.9 ml   Weight change: -4.9 kg  Physical Exam: Gen: Awake, turns head to calling his name while on BiPAP.  Intermittently moaning CVS: Pulse regular tachycardia, S1 and S2 normal Resp: Fine rales/transmitted breath sounds bilaterally Abd: Soft, obese, nontender, bowel sounds normal Ext: 3-4+ anasarca  Imaging: DG CHEST PORT 1 VIEW Result Date: 05/08/2023 CLINICAL DATA:  Central line placement EXAM: PORTABLE CHEST 1 VIEW COMPARISON:  05/06/2023 FINDINGS: Interval placement of left internal jugular approach central venous catheter with distal tip terminating at the level of the distal SVC.  Interval placement of a right PICC line with distal tip terminating at the level of the superior cavoatrial junction. Stable heart size. Pulmonary vascular congestion. Mildly prominent interstitial markings bilaterally. No pleural effusion or pneumothorax. IMPRESSION: 1. Interval placement of left IJ central venous catheter and right PICC line. No pneumothorax. 2. Pulmonary vascular congestion and mild interstitial edema. Electronically Signed   By: Duanne Guess D.O.   On: 05/08/2023 11:10   CT HEAD WO CONTRAST ( ) Result Date: 05/07/2023 CLINICAL DATA:  Neuro deficit, acute, stroke suspected EXAM: CT HEAD WITHOUT CONTRAST TECHNIQUE: Contiguous axial images were obtained from the base of the skull through the vertex without intravenous contrast. RADIATION DOSE REDUCTION: This exam was performed according to the departmental dose-optimization program which includes automated exposure control, adjustment of the mA and/or kV according to patient size and/or use of iterative reconstruction technique. COMPARISON:  CT head 05/06/2023. FINDINGS: Brain: No evidence of acute infarction, hemorrhage, hydrocephalus, extra-axial collection or mass lesion/mass effect. Similar patchy white matter hypodensities, nonspecific but compatible with chronic microvascular disease. Vascular: No hyperdense vessel Skull: No acute fracture. Sinuses/Orbits: Mostly clear sinuses.  No acute orbital findings. Other: No mastoid effusions. IMPRESSION: No evidence of acute intracranial abnormality. An MRI could provide more sensitive evaluation for acute infarct if clinically warranted Electronically Signed   By: Feliberto Harts M.D.   On: 05/07/2023 22:32   [place] EKG Site Rite Result Date: 05/07/2023 If Site Rite image not attached, placement could not be confirmed due to current cardiac rhythm.   Labs: BMET Recent Labs  Lab 05/06/23 [id number] 05/06/23 2037 05/06/23 2226  05/07/23 0059 05/07/23 1501 05/08/23 0425 05/08/23 0508  05/08/23 1600 05/08/23 1620 05/09/23 0509 05/09/23 0514  NA 137 139 138 138 137 137 135 138 135 137  135  K 4.0 4.5 4.7 4.7 4.6 4.3 4.1 4.5 4.3 4.6 4.7  CL 96* 97* 95* 99  --   --  91* 97*  --   --  100  CO2 27 24 25  21*  --   --  23 24  --   --  22  GLUCOSE 86 99 68* 100*  --   --  271* 105*  --   --  153*  BUN 21* 26* 27* 28*  --   --  42* 46*  --   --  35*  CREATININE 1.57* 2.45* 2.82* 2.83*  --   --  5.05* 5.22*  --   --  4.17*  CALCIUM 8.9 8.9 9.0 9.2  --   --  6.6* 7.5*  --   --  8.0*  PHOS  --   --   --   --   --   --   --  5.5*  --   --  4.4   CBC Recent Labs  Lab 05/04/23 1645 05/05/23 0346 05/06/23 0504 05/06/23 2226 05/07/23 0059 05/07/23 1501 05/08/23 0508 05/08/23 1620 05/09/23 0509 05/09/23 0515  WBC 9.3 9.4   < > 12.8* 13.4*  --  13.2*  --   --  13.0*  NEUTROABS 7.4 6.9  --  9.5*  --   --   --   --   --   --   HGB 15.0 14.8   < > 16.8 17.3*   < > 13.2 16.3 16.3 14.3  HCT 49.7 48.9   < > 57.6* 58.8*   < > 43.2 48.0 48.0 46.8  MCV 86.9 87.5   < > 89.6 89.6  --  87.3  --   --  85.4  PLT 248 262   < > 316 291  --  208  --   --  237   < > = values in this interval not displayed.    Medications:     aspirin  81 mg Oral Daily   Chlorhexidine Gluconate Cloth  6 each Topical Daily   enoxaparin (LOVENOX) injection  70 mg Subcutaneous Q24H   mouth rinse  15 mL Mouth Rinse 4 times per day   pantoprazole (PROTONIX) IV  40 mg Intravenous Q24H   sodium chloride flush  10-40 mL Intracatheter Q12H    Zetta Bills, MD 05/09/2023, 7:42 AM

## 2023-05-09 NOTE — Plan of Care (Signed)
  Problem: Coping: Goal: Ability to adjust to condition or change in health will improve Outcome: Progressing   Problem: Fluid Volume: Goal: Ability to maintain a balanced intake and output will improve Outcome: Progressing   Problem: Metabolic: Goal: Ability to maintain appropriate glucose levels will improve Outcome: Progressing   Problem: Nutritional: Goal: Maintenance of adequate nutrition will improve Outcome: Progressing   Problem: Tissue Perfusion: Goal: Adequacy of tissue perfusion will improve Outcome: Progressing   Problem: Pain Managment: Goal: General experience of comfort will improve and/or be controlled Outcome: Progressing

## 2023-05-09 NOTE — Progress Notes (Signed)
NAME:  Dylan Owen, MRN:  [id number], DOB:  [birth year], LOS: 4 ADMISSION DATE:  05/04/2023, CONSULTATION DATE:  05/07/23 REFERRING MD:  Stevphen Meuse, CHIEF COMPLAINT:  resp failure shock    History of Present Illness:  58 year old male with significant obesity, hypertension, hyperlipidemia, type 2 diabetes, sleep apnea, chronic back pain, depression, acid reflux.  Also depression significant home medications include Lasix, hydrochlorothiazide.  Lisinopril, metoprolol, nitroglycerin but also Robaxin and MS Contin 30 mg twice daily along with Repatha.  And Effexor.  Along with insulin for diabetes.  She he walks around with a cane at baseline.  He also admits to medication noncompliance.  And 10 pound weight gain he is not on oxygen at home.  His hemoglobin A1c was 10.2 at admission consistent with very poor compliance.   Admitted on 05/04/2023 with complaints of cough, sore throat, dizziness and bodyaches for 1 week.  Also included chest tightness headaches chills diaphoresis.  At the time of arrival to the ED also had dyspnea on exertion.  At admission BNP was 1275.  Respiratory virus panel was negative.  He was started on Lasix 60 twice daily and his home lisinopril.  He was initially hypertensive and with this management blood pressure improved.  Echocardiogram showed low ejection fraction approximately 25% although poor acoustic windows.  Chest x-ray suggestive of venous congestion.  Cardiology consultation 05/06/2023 noted net -1.6 L since admission and patient was given history of increased frequency of urination.  Diagnose of viral myocarditis was made and left a right heart catheterization was considered.  Troponin barely elevated and consistent with viral myocarditis.   Postadmission with diuresis as of 05/06/2023 creatinine rose up to 1.57 mg percent [baseline 1 mg percent].  At this point lisinopril was held.  811/31/25 developed hypoglycemia requiring infusion of dextrose and transferred to the  intensive care unit.  Post transfer to the intensive care unit he developed acute fever and also BiPAP dependency.  Along with worsening renal failure further to creatinine of 2.83 mg percent along with persistent lactic acidosis around 3.  And mild intermittent delirium.  Overnight diaphoresis reported but despite all this nursing consistently reports that his periphery is extremely cold.     Cardiology has been reconsulted.  Critical care medicine consulted.  Pertinent  Medical History  B12 deficiency, Blood transfusion without reported diagnosis, Chronic low back pain, Depression, Diabetes mellitus with complication (HCC), GERD (gastroesophageal reflux disease), Heart murmur, History of adenomatous polyp of colon (05/22/2021), Hypercholesterolemia, Hypertension, OSA (obstructive sleep apnea), Peripheral neuropathy, Persistent asthma, Seizures (HCC), Stable angina (HCC), Statin intolerance, and Subclinical hypothyroidism (06/2017).    reports that he has never smoked. His smokeless tobacco use includes chew.  Significant Hospital Events: Including procedures, antibiotic start and stop dates in addition to other pertinent events   1/29 admit to [facility], diuresis 1/30 ECHO w LVEF 25% 1/31 cards consult, c/f viral myocarditis  2/1 PCCM consult txf to [employer], Adv HF consult  2/2 for HD cath placement and CRRT. Incr D10 for persistent hypogly   2/3 milrinone stopped. Weaning NE    Interim History / Subjective:   Normal coox after milrinone weaned off. NE is weaning    Temps overall improving after CRRT started 2/2, with temp set to 36 but still high   UA w multiple species   Objective   Blood pressure (!) 141/69, pulse (!) 117, temperature (!) 101.9 F (38.8 C), temperature source Rectal, resp. rate (!) 28, height 6\' 1"  (1.854 m), weight (!) 141.6  kg, SpO2 98%. CVP:  [11 mmHg-32 mmHg] 11 mmHg  Vent Mode: PCV;BIPAP FiO2 (%):  [40 %] 40 % Set Rate:  [18 bmp] 18 bmp PEEP:  [6 cmH20] 6 cmH20    Intake/Output Summary (Last 24 hours) at 05/09/2023 1035 Last data filed at 05/09/2023 1000 Gross per 24 hour  Intake 2158.16 ml  Output 4243 ml  Net -2084.84 ml   Filed Weights   05/07/23 0500 05/08/23 0455 05/09/23 0600  Weight: (!) 145.7 kg (!) 146.5 kg (!) 141.6 kg    Examination:  General: Critically and chronically ill M  HENT: NCAT BiPAP in place. Large beard  Lungs: Symmetrical chest expansion, his lung sounds are obscured by continuous moaning  Cardiovascular:  tachy s1s2 cap refill < 3 sec BUE and >3sec BLE  Abdomen: obese soft ndnt  Extremities: edematous  Neuro: Awake, oriented x2 following commands  GU: foley dark urine   Resolved Hospital Problem list   Hypomag   Assessment & Plan:   Acute encephalopathy, metabolic -CT H without acute abnormality 2/1 -think uremia, acidosis, sepsis are driving. Does have a slightly elevated ammonia, but only to 40 & I doubt this is greatly contributory  P -delirium precautions -supportive care as below   Septic shock -- UTI, worry for add'l source however  Acute HFrEF --?septic cardiomyopathy  -initial c/f myocarditis less favored  P -NE for MAP > 65  -meropenem, adding vanc  -IV APAP for fever control x24 hr -follow cx data  -will likely send for CT a/p r/o intraabd process 2/3, pending airway plans   Acute hypoxic and hypercarbic resp failure Hx OSA Pulm edema Possible CAP  -RVP covid flu neg  P -I spoke with his wife 2/3 re his non-improving resp status and concerns for likely intolerance of laying flat if we were to send for CT. Consents to intubation if needed, discussed the high likelihood of this happening today  -volume off via crrt  -abx as above   AKI, likely ATN  Hyperphosphatemia Hypocalcemia  P -CRRT per nephro  -replace Ca   Elevated LFTs -- shock liver +/- congestive hepatopathy Mild hyperbilirubinemia Coagulopathy Mild Hyperammonemia  -acute hep neg  P -trend LFTs, PRN coags -will  check another ammonia 2/4 -- if rising (from itial of 40), start lactulose   Lactic acidosis  P -follow PRN  Hypoglycemia P -will stop d10 and recheck  Code status discussion / GOC  -spoke w wife 2/3 who verified Full code/ full scope of practice    Best Practice (right click and "Reselect all SmartList Selections" daily)   Diet/type: NPO DVT prophylaxis LMWH Pressure ulcer(s): pressure ulcer assessment deferred  GI prophylaxis: PPI Lines: Central line, Dialysis Catheter, and yes and it is still needed Foley:  Yes, and it is still needed Code Status:  full code Last date of multidisciplinary goals of care discussion [wife updated 2/3]  Labs   CBC: Recent Labs  Lab 05/04/23 1645 05/05/23 0346 05/06/23 0504 05/06/23 2226 05/07/23 0059 05/07/23 1501 05/08/23 0425 05/08/23 0508 05/08/23 1620 05/09/23 0509 05/09/23 0515  WBC 9.3 9.4 9.9 12.8* 13.4*  --   --  13.2*  --   --  13.0*  NEUTROABS 7.4 6.9  --  9.5*  --   --   --   --   --   --   --   HGB 15.0 14.8 16.1 16.8 17.3*   < > 15.0 13.2 16.3 16.3 14.3  HCT 49.7 48.9 53.3* 57.6* 58.8*   < >  44.0 43.2 48.0 48.0 46.8  MCV 86.9 87.5 89.0 89.6 89.6  --   --  87.3  --   --  85.4  PLT 248 262 272 316 291  --   --  208  --   --  237   < > = values in this interval not displayed.    Basic Metabolic Panel: Recent Labs  Lab 05/05/23 0633 05/06/23 0504 05/06/23 2226 05/07/23 0059 05/07/23 1501 05/08/23 0508 05/08/23 1600 05/08/23 1620 05/09/23 0509 05/09/23 0514  NA 138   < > 138 138   < > 135 138 135 137 135  K 3.2*   < > 4.7 4.7   < > 4.1 4.5 4.3 4.6 4.7  CL 99   < > 95* 99  --  91* 97*  --   --  100  CO2 28   < > 25 21*  --  23 24  --   --  22  GLUCOSE 104*   < > 68* 100*  --  271* 105*  --   --  153*  BUN 18   < > 27* 28*  --  42* 46*  --   --  35*  CREATININE 1.17   < > 2.82* 2.83*  --  5.05* 5.22*  --   --  4.17*  CALCIUM 8.7*   < > 9.0 9.2  --  6.6* 7.5*  --   --  8.0*  MG 1.7  --   --  2.1  --  1.5*  --    --   --  2.1  PHOS  --   --   --   --   --   --  5.5*  --   --  4.4   < > = values in this interval not displayed.   GFR: Estimated Creatinine Clearance: 28.9 mL/min (A) (by C-G formula based on SCr of 4.17 mg/dL (H)). Recent Labs  Lab 05/06/23 2226 05/07/23 0059 05/07/23 0527 05/07/23 1601 05/08/23 0508 05/09/23 0514 05/09/23 0515  PROCALCITON 0.21  --   --   --  1.09  --   --   WBC 12.8* 13.4*  --   --  13.2*  --  13.0*  LATICACIDVEN 3.7* 3.8* 3.3* 1.4 1.7 2.8*  --     Liver Function Tests: Recent Labs  Lab 05/06/23 2226 05/07/23 0059 05/08/23 0508 05/08/23 1600 05/09/23 0514  AST 625* 1,258* 1,792* 1,882* 1,458*  ALT 252* 454* 695* 786* 686*  ALKPHOS 70 72 66 81 81  BILITOT 3.1* 3.2* 2.5* 2.9* 2.9*  PROT 7.6 7.5 7.6 7.0 6.9  ALBUMIN 3.4* 3.6 2.7* 3.0*  3.0* 2.9*   Recent Labs  Lab 05/08/23 0508  LIPASE 24   Recent Labs  Lab 05/07/23 0836 05/08/23 1600  AMMONIA <10 40*    ABG    Component Value Date/Time   PHART 7.302 (L) 05/09/2023 0509   PCO2ART 48.1 (H) 05/09/2023 0509   PO2ART 109 (H) 05/09/2023 0509   HCO3 23.4 05/09/2023 0509   TCO2 25 05/09/2023 0509   ACIDBASEDEF 3.0 (H) 05/09/2023 0509   O2SAT 82.1 05/09/2023 0757     Coagulation Profile: Recent Labs  Lab 05/07/23 0836 05/08/23 1600  INR 1.7* 2.2*    Cardiac Enzymes: Recent Labs  Lab 05/08/23 0508  CKTOTAL 320  CKMB 3.7    HbA1C: Hemoglobin A1C  Date/Time Value Ref Range Status  11/05/2022 01:58 PM 9.1 (A) 4.0 - 5.6 % Final  08/05/2022 01:54 PM 8.1 (A) 4.0 - 5.6 % Final   HbA1c, POC (prediabetic range)  Date/Time Value Ref Range Status  11/05/2022 01:58 PM 9.1 (A) 5.7 - 6.4 % Final  08/05/2022 01:54 PM 8.1 (A) 5.7 - 6.4 % Final   HbA1c, POC (controlled diabetic range)  Date/Time Value Ref Range Status  11/05/2022 01:58 PM 9.1 (A) 0.0 - 7.0 % Final  08/05/2022 01:54 PM 8.1 (A) 0.0 - 7.0 % Final   HbA1c POC (<> result, manual entry)  Date/Time Value Ref Range  Status  11/05/2022 01:58 PM 9.1 4.0 - 5.6 % Final  08/05/2022 01:54 PM 8.1 4.0 - 5.6 % Final   Hgb A1c MFr Bld  Date/Time Value Ref Range Status  05/05/2023 03:46 AM 10.2 (H) 4.8 - 5.6 % Final    Comment:    (NOTE) Pre diabetes:          5.7%-6.4%  Diabetes:              >6.4%  Glycemic control for   <7.0% adults with diabetes     CBG: Recent Labs  Lab 05/08/23 1947 05/09/23 0000 05/09/23 0508 05/09/23 0746 05/09/23 0903  GLUCAP 116* 151* 154* 228* 142*     Critical care time:        CRITICAL CARE Performed by: Lanier Clam   Total critical care time: 51 minutes  Critical care time was exclusive of separately billable procedures and treating other patients. Critical care was necessary to treat or prevent imminent or life-threatening deterioration.  Critical care was time spent personally by me on the following activities: development of treatment plan with patient and/or surrogate as well as nursing, discussions with consultants, evaluation of patient's response to treatment, examination of patient, obtaining history from patient or surrogate, ordering and performing treatments and interventions, ordering and review of laboratory studies, ordering and review of radiographic studies, pulse oximetry and re-evaluation of patient's condition.  [name] MSN, AGACNP-BC [facility] Pulmonary/Critical Care Medicine Amion for pager 05/09/2023, 10:35 AM

## 2023-05-09 NOTE — Progress Notes (Signed)
SLP Cancellation Note  Patient Details Name: Dylan Owen MRN: [id number] DOB: [birth year]   Cancelled treatment:   Still on BiPAP with potential for intubation.  Will follow chart.  Fotini Lemus L. Samson Frederic, MA CCC/SLP Clinical Specialist - Acute Care SLP Acute Rehabilitation Services Office number [phone]         [name] [name] 05/09/2023, 9:15 AM

## 2023-05-09 NOTE — Plan of Care (Signed)
Cardiology Cross Cover Note  Frequent ectopy on milrinone. Normal coox and lactate. Primary team favoring septic shock as the etiology. Will stop milrinone and repeat coox and lactate in 4 hours.  Lucila Maine, MD

## 2023-05-09 NOTE — Progress Notes (Signed)
 Heart Failure Navigator Progress Note  Assessed for Heart & Vascular TOC clinic readiness.  Patient does not meet criteria due to Advanced Heart Failure Team patient of Dr. Elwyn Lade. .   Navigator will sign off at this time.   Rhae Hammock, BSN, Scientist, clinical (histocompatibility and immunogenetics) Only

## 2023-05-09 NOTE — Progress Notes (Signed)
Critical lactic acid 2.8 reported to Jonny Ruiz of CCM.

## 2023-05-10 DIAGNOSIS — R6521 Severe sepsis with septic shock: Secondary | ICD-10-CM | POA: Diagnosis not present

## 2023-05-10 DIAGNOSIS — A419 Sepsis, unspecified organism: Secondary | ICD-10-CM | POA: Diagnosis not present

## 2023-05-10 DIAGNOSIS — I5021 Acute systolic (congestive) heart failure: Secondary | ICD-10-CM | POA: Diagnosis not present

## 2023-05-10 DIAGNOSIS — G9341 Metabolic encephalopathy: Secondary | ICD-10-CM | POA: Diagnosis not present

## 2023-05-10 DIAGNOSIS — N179 Acute kidney failure, unspecified: Secondary | ICD-10-CM | POA: Diagnosis not present

## 2023-05-10 LAB — RENAL FUNCTION PANEL
Albumin: 2.4 g/dL — ABNORMAL LOW (ref 3.5–5.0)
Albumin: 2.6 g/dL — ABNORMAL LOW (ref 3.5–5.0)
Anion gap: 15 (ref 5–15)
Anion gap: 12 (ref 5–15)
BUN: 29 mg/dL — ABNORMAL HIGH (ref 6–20)
BUN: 30 mg/dL — ABNORMAL HIGH (ref 6–20)
CO2: 19 mmol/L — ABNORMAL LOW (ref 22–32)
CO2: 22 mmol/L (ref 22–32)
Calcium: 8.3 mg/dL — ABNORMAL LOW (ref 8.9–10.3)
Calcium: 8.6 mg/dL — ABNORMAL LOW (ref 8.9–10.3)
Chloride: 102 mmol/L (ref 98–111)
Chloride: 100 mmol/L (ref 98–111)
Creatinine, Ser: 2.96 mg/dL — ABNORMAL HIGH (ref 0.61–1.24)
Creatinine, Ser: 2.54 mg/dL — ABNORMAL HIGH (ref 0.61–1.24)
GFR, Estimated: 24 mL/min — ABNORMAL LOW (ref >60)
GFR, Estimated: 29 mL/min — ABNORMAL LOW (ref >60)
Glucose, Bld: 182 mg/dL — ABNORMAL HIGH (ref 70–99)
Glucose, Bld: 219 mg/dL — ABNORMAL HIGH (ref 70–99)
Phosphorus: 3.9 mg/dL (ref 2.5–4.6)
Phosphorus: 4 mg/dL (ref 2.5–4.6)
Potassium: 5.2 mmol/L — ABNORMAL HIGH (ref 3.5–5.1)
Potassium: 4.9 mmol/L (ref 3.5–5.1)
Sodium: 136 mmol/L (ref 135–145)
Sodium: 134 mmol/L — ABNORMAL LOW (ref 135–145)

## 2023-05-10 LAB — BASIC METABOLIC PANEL
Anion gap: 15 (ref 5–15)
BUN: 28 mg/dL — ABNORMAL HIGH (ref 6–20)
CO2: 21 mmol/L — ABNORMAL LOW (ref 22–32)
Calcium: 8.7 mg/dL — ABNORMAL LOW (ref 8.9–10.3)
Chloride: 98 mmol/L (ref 98–111)
Creatinine, Ser: 2.75 mg/dL — ABNORMAL HIGH (ref 0.61–1.24)
GFR, Estimated: 26 mL/min — ABNORMAL LOW (ref >60)
Glucose, Bld: 186 mg/dL — ABNORMAL HIGH (ref 70–99)
Potassium: 5.3 mmol/L — ABNORMAL HIGH (ref 3.5–5.1)
Sodium: 134 mmol/L — ABNORMAL LOW (ref 135–145)

## 2023-05-10 LAB — CBC
HCT: 47.6 % (ref 39.0–52.0)
Hemoglobin: 15.2 g/dL (ref 13.0–17.0)
MCH: 26.7 pg (ref 26.0–34.0)
MCHC: 31.9 g/dL (ref 30.0–36.0)
MCV: 83.5 fL (ref 80.0–100.0)
Platelets: 140 10*3/uL — ABNORMAL LOW (ref 150–400)
RBC: 5.7 MIL/uL (ref 4.22–5.81)
RDW: 16.7 % — ABNORMAL HIGH (ref 11.5–15.5)
WBC: 7.4 10*3/uL (ref 4.0–10.5)
nRBC: 0 % (ref 0.0–0.2)

## 2023-05-10 LAB — GLUCOSE, CAPILLARY
Glucose-Capillary: 174 mg/dL — ABNORMAL HIGH (ref 70–99)
Glucose-Capillary: 178 mg/dL — ABNORMAL HIGH (ref 70–99)
Glucose-Capillary: 181 mg/dL — ABNORMAL HIGH (ref 70–99)
Glucose-Capillary: 186 mg/dL — ABNORMAL HIGH (ref 70–99)
Glucose-Capillary: 195 mg/dL — ABNORMAL HIGH (ref 70–99)
Glucose-Capillary: 219 mg/dL — ABNORMAL HIGH (ref 70–99)
Glucose-Capillary: 221 mg/dL — ABNORMAL HIGH (ref 70–99)

## 2023-05-10 LAB — MRSA NEXT GEN BY PCR, NASAL: MRSA by PCR Next Gen: NOT DETECTED

## 2023-05-10 LAB — MAGNESIUM: Magnesium: 2.5 mg/dL — ABNORMAL HIGH (ref 1.7–2.4)

## 2023-05-10 LAB — COOXEMETRY PANEL
Carboxyhemoglobin: 1.7 % — ABNORMAL HIGH (ref 0.5–1.5)
Methemoglobin: 0.7 % (ref 0.0–1.5)
O2 Saturation: 83.9 %
Total hemoglobin: 14.1 g/dL (ref 12.0–16.0)

## 2023-05-10 LAB — HEPATIC FUNCTION PANEL
ALT: 564 U/L — ABNORMAL HIGH (ref 0–44)
AST: 982 U/L — ABNORMAL HIGH (ref 15–41)
Albumin: 2.4 g/dL — ABNORMAL LOW (ref 3.5–5.0)
Alkaline Phosphatase: 84 U/L (ref 38–126)
Bilirubin, Direct: 1.5 mg/dL — ABNORMAL HIGH (ref 0.0–0.2)
Indirect Bilirubin: 1.5 mg/dL — ABNORMAL HIGH (ref 0.3–0.9)
Total Bilirubin: 3 mg/dL — ABNORMAL HIGH (ref 0.0–1.2)
Total Protein: 6.4 g/dL — ABNORMAL LOW (ref 6.5–8.1)

## 2023-05-10 LAB — POTASSIUM: Potassium: 5 mmol/L (ref 3.5–5.1)

## 2023-05-10 LAB — AMMONIA: Ammonia: 32 umol/L (ref 9–35)

## 2023-05-10 LAB — LEGIONELLA PNEUMOPHILA SEROGP 1 UR AG: L. pneumophila Serogp 1 Ur Ag: NEGATIVE

## 2023-05-10 MED ORDER — VITAL 1.5 CAL PO LIQD: 1000.0000 mL | ORAL | Status: DC

## 2023-05-10 MED ORDER — ACETAMINOPHEN 160 MG/5ML PO SOLN
650.0000 mg | Freq: Four times a day (QID) | ORAL | Status: DC | PRN
  Administered 2023-05-12: 650 mg
  Filled 2023-05-10: qty 20.3

## 2023-05-10 MED ORDER — LIDOCAINE 5 % EX PTCH
1.0000 | MEDICATED_PATCH | CUTANEOUS | Status: DC
  Administered 2023-05-10 – 2023-05-25 (×15): 1 via TRANSDERMAL
  Filled 2023-05-10 (×17): qty 1

## 2023-05-10 MED ORDER — ONDANSETRON HCL 4 MG/2ML IJ SOLN
4.0000 mg | Freq: Three times a day (TID) | INTRAMUSCULAR | Status: DC | PRN
  Administered 2023-05-10 (×2): 4 mg via INTRAVENOUS
  Filled 2023-05-10 (×2): qty 2

## 2023-05-10 MED ORDER — PRISMASOL BGK 0/2.5 32-2.5 MEQ/L EC SOLN
Status: DC
  Filled 2023-05-10 (×5): qty 5000

## 2023-05-10 MED ORDER — ACETAMINOPHEN 650 MG RE SUPP: 650.0000 mg | Freq: Four times a day (QID) | RECTAL | Status: DC | PRN

## 2023-05-10 MED ORDER — ASPIRIN 81 MG PO CHEW
81.0000 mg | CHEWABLE_TABLET | Freq: Every day | ORAL | Status: DC
  Administered 2023-05-12: 81 mg
  Filled 2023-05-10: qty 1

## 2023-05-10 MED ORDER — PROSOURCE TF20 ENFIT COMPATIBL EN LIQD
60.0000 mL | Freq: Four times a day (QID) | ENTERAL | Status: DC
  Administered 2023-05-10 – 2023-05-12 (×7): 60 mL
  Filled 2023-05-10 (×8): qty 60

## 2023-05-10 MED ORDER — INSULIN ASPART 100 UNIT/ML IJ SOLN
0.0000 [IU] | INTRAMUSCULAR | Status: DC
  Administered 2023-05-10: 3 [IU] via SUBCUTANEOUS
  Administered 2023-05-11 (×2): 2 [IU] via SUBCUTANEOUS
  Administered 2023-05-11: 5 [IU] via SUBCUTANEOUS
  Administered 2023-05-11: 3 [IU] via SUBCUTANEOUS
  Administered 2023-05-11: 5 [IU] via SUBCUTANEOUS
  Administered 2023-05-11 (×2): 3 [IU] via SUBCUTANEOUS
  Administered 2023-05-12: 5 [IU] via SUBCUTANEOUS
  Administered 2023-05-12 (×2): 3 [IU] via SUBCUTANEOUS
  Administered 2023-05-12: 8 [IU] via SUBCUTANEOUS
  Administered 2023-05-12 – 2023-05-13 (×5): 3 [IU] via SUBCUTANEOUS
  Administered 2023-05-13: 5 [IU] via SUBCUTANEOUS
  Administered 2023-05-13: 3 [IU] via SUBCUTANEOUS
  Administered 2023-05-14: 8 [IU] via SUBCUTANEOUS
  Administered 2023-05-14: 3 [IU] via SUBCUTANEOUS
  Administered 2023-05-14: 11 [IU] via SUBCUTANEOUS
  Administered 2023-05-14: 5 [IU] via SUBCUTANEOUS
  Administered 2023-05-14: 3 [IU] via SUBCUTANEOUS
  Administered 2023-05-14: 8 [IU] via SUBCUTANEOUS
  Administered 2023-05-14: 5 [IU] via SUBCUTANEOUS
  Administered 2023-05-15: 3 [IU] via SUBCUTANEOUS
  Administered 2023-05-15: 5 [IU] via SUBCUTANEOUS
  Administered 2023-05-15: 2 [IU] via SUBCUTANEOUS

## 2023-05-10 MED ORDER — OXYCODONE HCL 5 MG PO TABS
10.0000 mg | ORAL_TABLET | Freq: Three times a day (TID) | ORAL | Status: DC
  Administered 2023-05-10 – 2023-05-11 (×5): 10 mg
  Filled 2023-05-10 (×5): qty 2

## 2023-05-10 MED ORDER — CLEVIDIPINE BUTYRATE 0.5 MG/ML IV EMUL
0.0000 mg/h | INTRAVENOUS | Status: DC
  Administered 2023-05-10 – 2023-05-11 (×2): 1 mg/h via INTRAVENOUS
  Administered 2023-05-11 (×2): 14 mg/h via INTRAVENOUS
  Administered 2023-05-11: 16 mg/h via INTRAVENOUS
  Administered 2023-05-12: 14 mg/h via INTRAVENOUS
  Filled 2023-05-10 (×6): qty 100

## 2023-05-10 MED ORDER — ACETAMINOPHEN 325 MG PO TABS: 650.0000 mg | ORAL_TABLET | Freq: Four times a day (QID) | ORAL | Status: DC | PRN

## 2023-05-10 MED ORDER — INSULIN ASPART 100 UNIT/ML IJ SOLN
0.0000 [IU] | INTRAMUSCULAR | Status: DC
  Administered 2023-05-10 (×2): 2 [IU] via SUBCUTANEOUS
  Administered 2023-05-10: 3 [IU] via SUBCUTANEOUS

## 2023-05-10 NOTE — Progress Notes (Signed)
 SLP Cancellation and Discharge Note  Patient Details Name: Dylan Owen MRN: [id number] DOB: 09/26/1965   Cancelled treatment:       Reason Eval/Treat Not Completed: Medical issues which prohibited therapy- pt intubated.  Will sign off.  Vernita Tague L. Vona, MA CCC/SLP Clinical Specialist - Acute Care SLP Acute Rehabilitation Services Office number [phone]    Vona Palma [name] 05/10/2023, 9:04 AM

## 2023-05-10 NOTE — Progress Notes (Signed)
 Nutrition Follow-up  DOCUMENTATION CODES:   Not applicable  INTERVENTION:   Plan for Cortrak placement tomorrow, goal for placement prior to extubation. Pt may require Bipap post extubation, try for post pyloric placement  Tube Feeding via OG:  Vital 1.5 at 50 ml/hr with Pro-Source TF20 60 mL QID  Increase to 30 ml/hr, titrate by 10 mL q 8 hours until goal rate of 50 ml/hr TF at goal provides 2120 kcals, 161 g of protein and 912 mL of free water   Continue Renal MVI  NUTRITION DIAGNOSIS:   Inadequate oral intake related to acute illness as evidenced by NPO status.  Being addressed via TF  GOAL:   Patient will meet greater than or equal to 90% of their needs  Progressing  MONITOR:   TF tolerance, Vent status, Labs, Weight trends  REASON FOR ASSESSMENT:   Consult Assessment of nutrition requirement/status (CRRT)  ASSESSMENT:   58 yo male admitted with new HFrEF and developed cardiogenic and septic shock requiring transfer to ICU, vasopressors and progressive anuric AKI initiation of CRRT, +shock liver. PMH includes HTN, DM, obesity, OSA, B12 deficiency, +smokeless tobacco use  1/30 Admitted 1/31 BiPap-NPO 2/01 Transferred to ICU 2/02 CRRT initiated 2/03 Intubated, trickle TF initiated, CT: multifocal pneumonia, mild diffuse body wall edema  Pt remains on vent support, CRRT, off pressors and actually on Cleviprex . Noted possible extubation tomorrow once more fluid removed. Noted 5L net UF in 24 hours, 1.4 L thus far today. Pt making very small amounts of concentrated urine  Tolerating Vital 1.5 at 20 ml/hr via OG, discussed nutrition poc with Dr. Harold and ok to advance TF towards goal  Noted Bipap and NPO starting evening of 1/31 until intubation and TF initiation on 2/03  Weight down to 138.6 kg, down 3 kg in 24 hours, current dry weight still unknown  Labs: sodium 134 (L), potassium 5.3 (H), phosphorus 3.9 (wdl) Meds: ss novolog , colace, miralax     Diet  Order:   Diet Order             Diet NPO time specified  Diet effective now                   EDUCATION NEEDS:   Not appropriate for education at this time  Skin:  Skin Assessment: Reviewed RN Assessment  Last BM:  2/03 type 6 large  Height:   Ht Readings from Last 1 Encounters:  05/09/23 6' 1 (1.854 m)    Weight:   Wt Readings from Last 1 Encounters:  05/10/23 (!) 138.6 kg     BMI:  Body mass index is 40.31 kg/m.  Estimated Nutritional Needs:   Kcal:  2100-2400 kcals  Protein:  150-180 g  Fluid:  1.5 L    Betsey Finger MS, RDN, LDN, CNSC Registered Dietitian 3 Clinical Nutrition RD Inpatient Contact Info in Amion

## 2023-05-10 NOTE — Progress Notes (Signed)
 eLink Physician-Brief Progress Note Patient Name: Dylan Owen DOB: April 16, 1965 MRN: [id number]   Date of Service  05/10/2023  HPI/Events of Note  Previously hypoglycemic on acute 2-hour CBG.  CBGs have improved.  Currently on CRRT.  CT of the chest/abdomen/pelvis reviewed with evidence of multifocal airspace disease and likely cystitis  eICU Interventions  Maintain broad-spectrum antibiotics  Switch to every 4 hours CBG monitoring.  Hold insulin  for the time being, goal glucose less than 180     Intervention Category Intermediate Interventions: Hyperglycemia - evaluation and treatment  Veneda Kirksey 05/10/2023, 2:32 AM

## 2023-05-10 NOTE — Progress Notes (Signed)
 NAME:  Dylan Owen, MRN:  [id number], DOB:  January 05, 1966, LOS: 5 ADMISSION DATE:  05/04/2023, CONSULTATION DATE:  05/07/23 REFERRING MD:  Nilda, CHIEF COMPLAINT:  resp failure shock    History of Present Illness:  58 year old male with significant obesity, hypertension, hyperlipidemia, type 2 diabetes, sleep apnea, chronic back pain, depression, acid reflux.  Also depression significant home medications include Lasix , hydrochlorothiazide .  Lisinopril , metoprolol , nitroglycerin  but also Robaxin  and MS Contin  30 mg twice daily along with Repatha .  And Effexor .  Along with insulin  for diabetes.  She he walks around with a cane at baseline.  He also admits to medication noncompliance.  And 10 pound weight gain he is not on oxygen at home.  His hemoglobin A1c was 10.2 at admission consistent with very poor compliance.   Admitted on 05/04/2023 with complaints of cough, sore throat, dizziness and bodyaches for 1 week.  Also included chest tightness headaches chills diaphoresis.  At the time of arrival to the ED also had dyspnea on exertion.  At admission BNP was 1275.  Respiratory virus panel was negative.  He was started on Lasix  60 twice daily and his home lisinopril .  He was initially hypertensive and with this management blood pressure improved.  Echocardiogram showed low ejection fraction approximately 25% although poor acoustic windows.  Chest x-ray suggestive of venous congestion.  Cardiology consultation 05/06/2023 noted net -1.6 L since admission and patient was given history of increased frequency of urination.  Diagnose of viral myocarditis was made and left a right heart catheterization was considered.  Troponin barely elevated and consistent with viral myocarditis.   Postadmission with diuresis as of 05/06/2023 creatinine rose up to 1.57 mg percent [baseline 1 mg percent].  At this point lisinopril  was held.  811/31/25 developed hypoglycemia requiring infusion of dextrose  and transferred to the  intensive care unit.  Post transfer to the intensive care unit he developed acute fever and also BiPAP dependency.  Along with worsening renal failure further to creatinine of 2.83 mg percent along with persistent lactic acidosis around 3.  And mild intermittent delirium.  Overnight diaphoresis reported but despite all this nursing consistently reports that his periphery is extremely cold.     Cardiology has been reconsulted.  Critical care medicine consulted.  Pertinent  Medical History  B12 deficiency, Blood transfusion without reported diagnosis, Chronic low back pain, Depression, Diabetes mellitus with complication (HCC), GERD (gastroesophageal reflux disease), Heart murmur, History of adenomatous polyp of colon (05/22/2021), Hypercholesterolemia, Hypertension, OSA (obstructive sleep apnea), Peripheral neuropathy, Persistent asthma, Seizures (HCC), Stable angina (HCC), Statin intolerance, and Subclinical hypothyroidism (06/2017).    reports that he has never smoked. His smokeless tobacco use includes chew.  Significant Hospital Events: Including procedures, antibiotic start and stop dates in addition to other pertinent events   1/29 admit to [facility], diuresis 1/30 ECHO w LVEF 25% 1/31 cards consult, c/f viral myocarditis  2/1 PCCM consult txf to [facility], Adv HF consult  2/2 for HD cath placement and CRRT. Incr D10 for persistent hypogly   2/3 milrinone  stopped. Weaning NE; intubated due to being on bipap for a few days and needing to go to CT   Interim History / Subjective:   Off NE Intubated yesterday due to being on bipap and needing CT scan. CT showing multifocal pna. Abx broadened to meropenem . WBC down to 7 from 14 On CRRT  Objective   Blood pressure 94/65, pulse (!) 55, temperature 99 F (37.2 C), resp. rate (!) 21, height 6' 1 (  1.854 m), weight (!) 138.6 kg, SpO2 100%. CVP:  [6 mmHg-17 mmHg] 14 mmHg  Vent Mode: PRVC FiO2 (%):  [40 %-100 %] 50 % Set Rate:  [24 bmp] 24 bmp Vt Set:   [640 mL] 640 mL PEEP:  [10 cmH20] 10 cmH20 Plateau Pressure:  [22 cmH20-24 cmH20] 22 cmH20   Intake/Output Summary (Last 24 hours) at 05/10/2023 0743 Last data filed at 05/10/2023 0700 Gross per 24 hour  Intake 2653.26 ml  Output 5166.4 ml  Net -2513.14 ml   Filed Weights   05/08/23 0455 05/09/23 0600 05/10/23 0500  Weight: (!) 146.5 kg (!) 141.6 kg (!) 138.6 kg    Examination: General:  critically ill appearing on mech vent HEENT: MM pink/moist; ETT in place; some breakdown on bridge of nose from bipap Neuro: Alert; MAE CV: s1s2, RRR, no m/r/g PULM:  dim clear BS bilaterally; on mech vent PRVC GI: soft, bsx4 active  Extremities: warm/dry, trace edema   Resolved Hospital Problem list   Hypomag   Assessment & Plan:   Acute encephalopathy, metabolic -CT H without acute abnormality 2/1 -think uremia, acidosis, sepsis are driving. Does have a slightly elevated ammonia, but only to 40 & I doubt this is greatly contributory  P: -delirium precautions -limit sedating meds  Septic shock -- UTI and multifocal pna Acute HFrEF -- possible cardiogenic? septic cardiomyopathy? -initial c/f myocarditis less favored  P: -off NE; map goal >65 -meropenem  added yesterday; follow cultures; consider narrowing -follow cultures  Acute hypoxic and hypercarbic resp failure Hx OSA Pulm edema Possible CAP  -RVP covid flu neg  P: -SBT today; consider extubation -CRRT for volume removal -LTVV strategy with tidal volumes of 6-8 cc/kg ideal body weight -Wean PEEP/FiO2 for SpO2 >92% -VAP bundle in place -Daily SAT and SBT -PAD protocol in place -wean sedation for RASS goal 0 to -1 -abs as above; trach aspirate sent 2/3 cont to follow  AKI, likely ATN  Hyperphosphatemia Hypocalcemia  P: -CRRT per nephro -Trend BMP / urinary output -Replace electrolytes as indicated -Avoid nephrotoxic agents, ensure adequate renal perfusion  Elevated LFTs -- shock liver +/- congestive hepatopathy Mild  hyperbilirubinemia Coagulopathy Mild Hyperammonemia  -acute hep neg  P: -Trend cmp and coags -ammonia wnl this am  Hypoglycemia P: -off d10; cbg monitoring  Code status discussion / GOC  P: -spoke w wife 2/3 who verified Full code/ full scope of practice    Best Practice (right click and Reselect all SmartList Selections daily)   Diet/type: NPO DVT prophylaxis LMWH Pressure ulcer(s): pressure ulcer assessment deferred  GI prophylaxis: PPI Lines: Central line, Dialysis Catheter, and yes and it is still needed Foley:  Yes, and it is still needed Code Status:  full code Last date of multidisciplinary goals of care discussion [wife updated 2/3]  Labs   CBC: Recent Labs  Lab 05/04/23 1645 05/05/23 0346 05/06/23 0504 05/06/23 2226 05/07/23 0059 05/07/23 1501 05/08/23 0508 05/08/23 1620 05/09/23 0509 05/09/23 0515 05/09/23 1253 05/10/23 0455  WBC 9.3 9.4   < > 12.8* 13.4*  --  13.2*  --   --  13.0*  --  7.4  NEUTROABS 7.4 6.9  --  9.5*  --   --   --   --   --   --   --   --   HGB 15.0 14.8   < > 16.8 17.3*   < > 13.2 16.3 16.3 14.3 15.3 15.2  HCT 49.7 48.9   < > 57.6* 58.8*   < >  43.2 48.0 48.0 46.8 45.0 47.6  MCV 86.9 87.5   < > 89.6 89.6  --  87.3  --   --  85.4  --  83.5  PLT 248 262   < > 316 291  --  208  --   --  237  --  140*   < > = values in this interval not displayed.    Basic Metabolic Panel: Recent Labs  Lab 05/05/23 0633 05/06/23 0504 05/07/23 0059 05/07/23 1501 05/08/23 0508 05/08/23 1600 05/08/23 1620 05/09/23 0509 05/09/23 0514 05/09/23 1253 05/09/23 1953 05/10/23 0455  NA 138   < > 138   < > 135 138   < > 137 135 133* 135 136  K 3.2*   < > 4.7   < > 4.1 4.5   < > 4.6 4.7 4.6 4.9 5.2*  CL 99   < > 99  --  91* 97*  --   --  100  --  102 102  CO2 28   < > 21*  --  23 24  --   --  22  --  19* 19*  GLUCOSE 104*   < > 100*  --  271* 105*  --   --  153*  --  145* 182*  BUN 18   < > 28*  --  42* 46*  --   --  35*  --  30* 29*  CREATININE  1.17   < > 2.83*  --  5.05* 5.22*  --   --  4.17*  --  3.23* 2.96*  CALCIUM  8.7*   < > 9.2  --  6.6* 7.5*  --   --  8.0*  --  8.1* 8.3*  MG 1.7  --  2.1  --  1.5*  --   --   --  2.1  --   --  2.5*  PHOS  --   --   --   --   --  5.5*  --   --  4.4  --  3.1 3.9   < > = values in this interval not displayed.   GFR: Estimated Creatinine Clearance: 40.3 mL/min (A) (by C-G formula based on SCr of 2.96 mg/dL (H)). Recent Labs  Lab 05/06/23 2226 05/07/23 0059 05/07/23 0527 05/07/23 1601 05/08/23 0508 05/09/23 0514 05/09/23 0515 05/09/23 1210 05/10/23 0455  PROCALCITON 0.21  --   --   --  1.09  --   --   --   --   WBC 12.8* 13.4*  --   --  13.2*  --  13.0*  --  7.4  LATICACIDVEN 3.7* 3.8*   < > 1.4 1.7 2.8*  --  1.4  --    < > = values in this interval not displayed.    Liver Function Tests: Recent Labs  Lab 05/07/23 0059 05/08/23 0508 05/08/23 1600 05/09/23 0514 05/09/23 1953 05/10/23 0455  AST 1,258* 1,792* 1,882* 1,458*  --  982*  ALT 454* 695* 786* 686*  --  564*  ALKPHOS 72 66 81 81  --  84  BILITOT 3.2* 2.5* 2.9* 2.9*  --  3.0*  PROT 7.5 7.6 7.0 6.9  --  6.4*  ALBUMIN  3.6 2.7* 3.0*  3.0* 2.9* 2.4* 2.4*  2.4*   Recent Labs  Lab 05/08/23 0508  LIPASE 24   Recent Labs  Lab 05/07/23 0836 05/08/23 1600 05/09/23 0953 05/10/23 0455  AMMONIA <10 40* <10 32    ABG  Component Value Date/Time   PHART 7.403 05/09/2023 1253   PCO2ART 41.4 05/09/2023 1253   PO2ART 282 (H) 05/09/2023 1253   HCO3 25.8 05/09/2023 1253   TCO2 27 05/09/2023 1253   ACIDBASEDEF 3.0 (H) 05/09/2023 0509   O2SAT 83.9 05/10/2023 0507     Coagulation Profile: Recent Labs  Lab 05/07/23 0836 05/08/23 1600  INR 1.7* 2.2*    Cardiac Enzymes: Recent Labs  Lab 05/08/23 0508  CKTOTAL 320  CKMB 3.7    HbA1C: Hemoglobin A1C  Date/Time Value Ref Range Status  11/05/2022 01:58 PM 9.1 (A) 4.0 - 5.6 % Final  08/05/2022 01:54 PM 8.1 (A) 4.0 - 5.6 % Final   HbA1c, POC (prediabetic  range)  Date/Time Value Ref Range Status  11/05/2022 01:58 PM 9.1 (A) 5.7 - 6.4 % Final  08/05/2022 01:54 PM 8.1 (A) 5.7 - 6.4 % Final   HbA1c, POC (controlled diabetic range)  Date/Time Value Ref Range Status  11/05/2022 01:58 PM 9.1 (A) 0.0 - 7.0 % Final  08/05/2022 01:54 PM 8.1 (A) 0.0 - 7.0 % Final   HbA1c POC (<> result, manual entry)  Date/Time Value Ref Range Status  11/05/2022 01:58 PM 9.1 4.0 - 5.6 % Final  08/05/2022 01:54 PM 8.1 4.0 - 5.6 % Final   Hgb A1c MFr Bld  Date/Time Value Ref Range Status  05/05/2023 03:46 AM 10.2 (H) 4.8 - 5.6 % Final    Comment:    (NOTE) Pre diabetes:          5.7%-6.4%  Diabetes:              >6.4%  Glycemic control for   <7.0% adults with diabetes     CBG: Recent Labs  Lab 05/09/23 2004 05/09/23 2218 05/10/23 0022 05/10/23 0211 05/10/23 0417  GLUCAP 152* 152* 186* 174* 178*     Critical care time: 35 minutes       JD Emilio RIGGERS [facility] 05/10/2023, 8:20 AM  Please see [email or web address] for pager details.  From 7A-7P if no response, please call [phone]. After hours, please call ELink [phone].

## 2023-05-10 NOTE — Procedures (Signed)
 Patient intubated yesterday morning and continues to have low pressor requirements with only vasopressin .  Problems with hypoglycemia noted overnight and CT scan shows multifocal airspace disease with likely cystitis.  Tolerating CRRT at the current prescription with BGK 4/2.5 pre and post filter as well as dialysate.  UF rate 100-150 cc/h without problems with net -2.5 L fluid balance overnight.  No changes to CRRT prescription at this time.  Remains anuric and without any evidence of renal recovery to prompt modality change.  Gordy Blanch MD [facility]. Office # [phone] Pager # [phone] 7:38 AM

## 2023-05-10 NOTE — Progress Notes (Signed)
 Advanced Heart Failure Rounding Note  HF Cardiologist: Dr. Zenaida   Chief Complaint: Mixed Cardiogenic and Septic Shock    Patient Profile   58 y.o. male with a PMH of obesity, HTN, HLD, DM2, and OSA admitted w/ mixed septic and cardiogenic shock>>progression to anuric renal failure requiring CRRT.  Interval Hx:    Yesterday, developed worsening septic shock, fever up to 101, increase LA, delirium and worsening respiratory failure>>intubated.  Milrinone  stopped due to frequent ectopy. Co-ox remained stable through the day in high 80s c/w septic shock. Abx broadened. Vanc added to meropenum. CT of C/A/P w/ multifocal PNA and s/o cystitis.  Subjective:    Improved today, remains intubated but awake on vent and following commands.  Fevers resolved. WBC 13>>11.   Off NE. Remains on VP 0.01 w/ stable MAPs. Co-ox 84% off milrinone .   Remains anuric. On CRRT currently pulling 150 cc/hr. CVP 14.   Wife present at bedside.   Objective:   Weight Range: (!) 138.6 kg Body mass index is 40.31 kg/m.   Vital Signs:   Temp:  [98.4 F (36.9 C)-102 F (38.9 C)] 98.8 F (37.1 C) (02/04 0746) Pulse Rate:  [47-126] 74 (02/04 0746) Resp:  [18-28] 24 (02/04 0746) BP: (77-128)/(58-73) 94/65 (02/04 0700) SpO2:  [85 %-100 %] 100 % (02/04 0746) Arterial Line BP: (59-220)/(31-135) 172/77 (02/04 0746) FiO2 (%):  [40 %-100 %] 40 % (02/04 0746) Weight:  [138.6 kg] 138.6 kg (02/04 0500) Last BM Date : 05/09/23  Weight change: Filed Weights   05/08/23 0455 05/09/23 0600 05/10/23 0500  Weight: (!) 146.5 kg (!) 141.6 kg (!) 138.6 kg    Intake/Output:   Intake/Output Summary (Last 24 hours) at 05/10/2023 0803 Last data filed at 05/10/2023 0700 Gross per 24 hour  Intake 2607.25 ml  Output 4981.4 ml  Net -2374.15 ml      Physical Exam    CVP 14 General:  obese, disheveled appearing male, intubated, awake on vent and following commands, no longer delirious HEENT: + ETT otherwise  normal Neck: supple. Thick neck, JVD not well visualized. LIJ HD cath, Carotids 2+ bilat; no bruits. No lymphadenopathy or thyromegaly appreciated. Cor: PMI nondisplaced. Regular rate & rhythm. No rubs, gallops or murmurs. Lungs: intubated and course anteriorly  Abdomen: obese, soft, nontender, nondistended. No hepatosplenomegaly. No bruits or masses. Good bowel sounds. Extremities: no cyanosis, clubbing, rash, 1+ b/l LE edema + RUE PICC  Neuro: alert & oriented x 3, cranial nerves grossly intact. moves all 4 extremities w/o difficulty. Affect pleasant.   Labs    CBC Recent Labs    05/09/23 0515 05/09/23 1253 05/10/23 0455  WBC 13.0*  --  7.4  HGB 14.3 15.3 15.2  HCT 46.8 45.0 47.6  MCV 85.4  --  83.5  PLT 237  --  140*   Basic Metabolic Panel Recent Labs    97/96/74 0514 05/09/23 1253 05/09/23 1953 05/10/23 0455  NA 135   < > 135 136  K 4.7   < > 4.9 5.2*  CL 100  --  102 102  CO2 22  --  19* 19*  GLUCOSE 153*  --  145* 182*  BUN 35*  --  30* 29*  CREATININE 4.17*  --  3.23* 2.96*  CALCIUM  8.0*  --  8.1* 8.3*  MG 2.1  --   --  2.5*  PHOS 4.4  --  3.1 3.9   < > = values in this interval not displayed.  Liver Function Tests Recent Labs    05/09/23 0514 05/09/23 1953 05/10/23 0455  AST 1,458*  --  982*  ALT 686*  --  564*  ALKPHOS 81  --  84  BILITOT 2.9*  --  3.0*  PROT 6.9  --  6.4*  ALBUMIN  2.9* 2.4* 2.4*  2.4*   Recent Labs    05/08/23 0508  LIPASE 24   Cardiac Enzymes Recent Labs    05/08/23 0508  CKTOTAL 320  CKMB 3.7    BNP: BNP (last 3 results) Recent Labs    05/04/23 1645  BNP 1,275.2*    ProBNP (last 3 results) No results for input(s): PROBNP in the last 8760 hours.   D-Dimer No results for input(s): DDIMER in the last 72 hours. Hemoglobin A1C No results for input(s): HGBA1C in the last 72 hours. Fasting Lipid Panel No results for input(s): CHOL, HDL, LDLCALC, TRIG, CHOLHDL, LDLDIRECT in the last 72  hours.  Thyroid  Function Tests No results for input(s): TSH, T4TOTAL, T3FREE, THYROIDAB in the last 72 hours.  Invalid input(s): FREET3  Other results:    Medications:     Scheduled Medications:  aspirin   81 mg Oral Daily   Chlorhexidine  Gluconate Cloth  6 each Topical Daily   docusate  100 mg Per Tube BID   famotidine   20 mg Per Tube BID   feeding supplement (VITAL 1.5 CAL)  1,000 mL Per Tube Q24H   heparin  injection (subcutaneous)  5,000 Units Subcutaneous Q8H   hydrocortisone  sod succinate (SOLU-CORTEF ) inj  100 mg Intravenous Q12H   insulin  aspart  0-9 Units Subcutaneous Q4H   multivitamin  1 tablet Per Tube QHS   mouth rinse  15 mL Mouth Rinse Q2H   pantoprazole  (PROTONIX ) IV  40 mg Intravenous Q24H   polyethylene glycol  17 g Per Tube Daily   sodium chloride  flush  10-40 mL Intracatheter Q12H    Infusions:   prismasol  BGK 4/2.5 400 mL/hr at 05/10/23 0421    prismasol  BGK 4/2.5 400 mL/hr at 05/10/23 0433   dexmedetomidine  (PRECEDEX ) IV infusion Stopped (05/10/23 0548)   fentaNYL  infusion INTRAVENOUS Stopped (05/10/23 0548)   meropenem  (MERREM ) IV Stopped (05/10/23 0610)   norepinephrine  (LEVOPHED ) Adult infusion 1 mcg/min (05/10/23 0400)   prismasol  BGK 4/2.5 1,500 mL/hr at 05/10/23 0701   vancomycin  Stopped (05/10/23 0214)   vasopressin  0.01 Units/min (05/10/23 0700)    PRN Medications: acetaminophen  **OR** acetaminophen , albuterol , fentaNYL , heparin , midazolam , nitroGLYCERIN , ondansetron  (ZOFRAN ) IV, mouth rinse, mouth rinse, mouth rinse, sodium chloride , sodium chloride  flush    Assessment/Plan   Mixed cardiogenic/septic shock: Presented febrile, cold on exam, with worsening blood pressure. TEE w/ moderately reduced LVEF. Initially on Milrinone . 2/3 developed worsening septic shock fever up to 101, increase in lactic acid, delirium, and worsening respiratory failure>>intubated. Milrinone  stopped due to frequent ectopy. Abx broadened. Vanc added to  meropenum. CT of C/A/P w/ multifocal PNA and s/o cystitis. UA dirty, Ucx multiple species, Respiratory Cx no growth, Bcx NGTD - much improved today, fevers resolved. LA 2.8>>1.4. WBC 13>11. Off NE. On VP 0.01 w/ stable MAPs. Delirium resolved, now appropriately following commands. Co-ox 84% off Milrinone . Remains anuric, CVP 14. Tolerating CRRT, currently pulling 150 cc/hr. Minimal pressor requirements - Continue Abx, on Vanc + Mero, follow culture data, suspect can narrow coverage. Will d/w CCM  - wean VP as tolerated, aim to keep MAP >65    - CMR/coronary evaluation when recovered - CRP/ESR only mildly elevated, doubt myocarditis as initially suspected  Acute hypoxic and hypercarbic respiratory failure:  Secondary to ongoing infection, acute heart failure. Intubated 2/3 - remains intubated, vent management per CCM  - continue abx per CCM  - CVP 14. CRRT for volume removal   AKI: 2/2 mixed shock. Remains anuric.  - continue CRRT, nephrology following    Shock liver: - LFTs remain elevated but downtrending  - Shock management per above   Diabetes:  poorly controlled,  A1c 10.2   - insulin  management per CCM   Delirium:  - resolved - continue to treat infection   CRITICAL CARE Performed by: Caffie Shed, PA-C    Total critical care time: 20 minutes  Critical care time was exclusive of separately billable procedures and treating other patients.  Critical care was necessary to treat or prevent imminent or life-threatening deterioration.  Critical care was time spent personally by me on the following activities: development of treatment plan with patient and/or surrogate as well as nursing, discussions with consultants, evaluation of patient's response to treatment, examination of patient, obtaining history from patient or surrogate, ordering and performing treatments and interventions, ordering and review of laboratory studies, ordering and review of radiographic studies,  pulse oximetry and re-evaluation of patient's condition.  Length of Stay: [address], PA-C  05/10/2023, 8:03 AM  Advanced Heart Failure Team Pager [phone] (M-F; 7a - 5p)  Please contact [facility] for night-coverage after hours (5p -7a ) and weekends on [email or web address]

## 2023-05-11 ENCOUNTER — Inpatient Hospital Stay (HOSPITAL_COMMUNITY): Payer: PPO

## 2023-05-11 DIAGNOSIS — J9601 Acute respiratory failure with hypoxia: Secondary | ICD-10-CM | POA: Diagnosis not present

## 2023-05-11 DIAGNOSIS — E119 Type 2 diabetes mellitus without complications: Secondary | ICD-10-CM

## 2023-05-11 DIAGNOSIS — J9602 Acute respiratory failure with hypercapnia: Secondary | ICD-10-CM | POA: Diagnosis not present

## 2023-05-11 DIAGNOSIS — R6521 Severe sepsis with septic shock: Secondary | ICD-10-CM | POA: Diagnosis not present

## 2023-05-11 DIAGNOSIS — I5021 Acute systolic (congestive) heart failure: Secondary | ICD-10-CM | POA: Diagnosis not present

## 2023-05-11 DIAGNOSIS — A419 Sepsis, unspecified organism: Secondary | ICD-10-CM | POA: Diagnosis not present

## 2023-05-11 LAB — RENAL FUNCTION PANEL
Albumin: 2.5 g/dL — ABNORMAL LOW (ref 3.5–5.0)
Albumin: 2.5 g/dL — ABNORMAL LOW (ref 3.5–5.0)
Anion gap: 13 (ref 5–15)
Anion gap: 14 (ref 5–15)
BUN: 31 mg/dL — ABNORMAL HIGH (ref 6–20)
BUN: 32 mg/dL — ABNORMAL HIGH (ref 6–20)
CO2: 20 mmol/L — ABNORMAL LOW (ref 22–32)
CO2: 20 mmol/L — ABNORMAL LOW (ref 22–32)
Calcium: 8.5 mg/dL — ABNORMAL LOW (ref 8.9–10.3)
Calcium: 8.3 mg/dL — ABNORMAL LOW (ref 8.9–10.3)
Chloride: 99 mmol/L (ref 98–111)
Chloride: 101 mmol/L (ref 98–111)
Creatinine, Ser: 2.25 mg/dL — ABNORMAL HIGH (ref 0.61–1.24)
Creatinine, Ser: 2.37 mg/dL — ABNORMAL HIGH (ref 0.61–1.24)
GFR, Estimated: 33 mL/min — ABNORMAL LOW (ref >60)
GFR, Estimated: 31 mL/min — ABNORMAL LOW (ref >60)
Glucose, Bld: 210 mg/dL — ABNORMAL HIGH (ref 70–99)
Glucose, Bld: 140 mg/dL — ABNORMAL HIGH (ref 70–99)
Phosphorus: 3.5 mg/dL (ref 2.5–4.6)
Phosphorus: 2.1 mg/dL — ABNORMAL LOW (ref 2.5–4.6)
Potassium: 4.8 mmol/L (ref 3.5–5.1)
Potassium: 4.5 mmol/L (ref 3.5–5.1)
Sodium: 132 mmol/L — ABNORMAL LOW (ref 135–145)
Sodium: 135 mmol/L (ref 135–145)

## 2023-05-11 LAB — CBC
HCT: 49.4 % (ref 39.0–52.0)
Hemoglobin: 15.8 g/dL (ref 13.0–17.0)
MCH: 26.6 pg (ref 26.0–34.0)
MCHC: 32 g/dL (ref 30.0–36.0)
MCV: 83.2 fL (ref 80.0–100.0)
Platelets: 128 10*3/uL — ABNORMAL LOW (ref 150–400)
RBC: 5.94 MIL/uL — ABNORMAL HIGH (ref 4.22–5.81)
RDW: 17.5 % — ABNORMAL HIGH (ref 11.5–15.5)
WBC: 7.9 10*3/uL (ref 4.0–10.5)
nRBC: 0 % (ref 0.0–0.2)

## 2023-05-11 LAB — MAGNESIUM: Magnesium: 2.6 mg/dL — ABNORMAL HIGH (ref 1.7–2.4)

## 2023-05-11 LAB — GLUCOSE, CAPILLARY
Glucose-Capillary: 127 mg/dL — ABNORMAL HIGH (ref 70–99)
Glucose-Capillary: 136 mg/dL — ABNORMAL HIGH (ref 70–99)
Glucose-Capillary: 172 mg/dL — ABNORMAL HIGH (ref 70–99)
Glucose-Capillary: 179 mg/dL — ABNORMAL HIGH (ref 70–99)
Glucose-Capillary: 196 mg/dL — ABNORMAL HIGH (ref 70–99)
Glucose-Capillary: 211 mg/dL — ABNORMAL HIGH (ref 70–99)
Glucose-Capillary: 223 mg/dL — ABNORMAL HIGH (ref 70–99)

## 2023-05-11 LAB — COOXEMETRY PANEL
Carboxyhemoglobin: 2 % — ABNORMAL HIGH (ref 0.5–1.5)
Methemoglobin: 0.9 % (ref 0.0–1.5)
O2 Saturation: 84.6 %
Total hemoglobin: 14.6 g/dL (ref 12.0–16.0)

## 2023-05-11 MED ORDER — METOCLOPRAMIDE HCL 5 MG/ML IJ SOLN
10.0000 mg | Freq: Once | INTRAMUSCULAR | Status: AC
  Administered 2023-05-11: 10 mg via INTRAVENOUS
  Filled 2023-05-11: qty 2

## 2023-05-11 MED ORDER — NALOXONE HCL 0.4 MG/ML IJ SOLN
0.4000 mg | INTRAMUSCULAR | Status: DC | PRN
  Filled 2023-05-11: qty 1

## 2023-05-11 MED ORDER — MORPHINE SULFATE (PF) 2 MG/ML IV SOLN
2.0000 mg | INTRAVENOUS | Status: DC | PRN
  Administered 2023-05-11: 2 mg via INTRAVENOUS
  Filled 2023-05-11 (×2): qty 1

## 2023-05-11 MED ORDER — METHOCARBAMOL 500 MG PO TABS: 500.0000 mg | ORAL_TABLET | Freq: Two times a day (BID) | ORAL | Status: DC

## 2023-05-11 MED ORDER — ORAL CARE MOUTH RINSE: 15.0000 mL | OROMUCOSAL | Status: DC | PRN

## 2023-05-11 MED ORDER — METHOCARBAMOL 500 MG PO TABS
500.0000 mg | ORAL_TABLET | Freq: Two times a day (BID) | ORAL | Status: DC
Start: 2023-05-11 — End: 2023-05-12
  Administered 2023-05-11 – 2023-05-12 (×2): 500 mg
  Filled 2023-05-11 (×2): qty 1

## 2023-05-11 MED ORDER — VITAL 1.5 CAL PO LIQD
1000.0000 mL | ORAL | Status: DC
  Administered 2023-05-11: 1000 mL

## 2023-05-11 MED ORDER — OXYCODONE HCL 5 MG PO TABS
10.0000 mg | ORAL_TABLET | Freq: Four times a day (QID) | ORAL | Status: DC
  Administered 2023-05-11 – 2023-05-12 (×3): 10 mg
  Filled 2023-05-11 (×3): qty 2

## 2023-05-11 MED ORDER — HYDROCORTISONE SOD SUC (PF) 100 MG IJ SOLR
50.0000 mg | Freq: Two times a day (BID) | INTRAMUSCULAR | Status: DC
Start: 2023-05-11 — End: 2023-05-11

## 2023-05-11 MED ORDER — ORAL CARE MOUTH RINSE
15.0000 mL | OROMUCOSAL | Status: DC | PRN
Start: 2023-05-11 — End: 2023-05-11

## 2023-05-11 MED ORDER — ORAL CARE MOUTH RINSE
15.0000 mL | OROMUCOSAL | Status: DC
Start: 2023-05-11 — End: 2023-05-11

## 2023-05-11 MED ORDER — MORPHINE SULFATE (PF) 2 MG/ML IV SOLN
2.0000 mg | INTRAVENOUS | Status: DC | PRN
  Administered 2023-05-11: 2 mg via INTRAVENOUS
  Filled 2023-05-11: qty 1

## 2023-05-11 NOTE — Progress Notes (Signed)
  Inpatient Rehab Admissions Coordinator :  Per therapy recommendations patient was screened for CIR candidacy by Heron Leavell RN MSN. Patient is not yet at a level to tolerate the intensity required to pursue a CIR admit .Remains on CRRT. The CIR admissions team will follow and monitor for progress and place a Rehab Consult order if felt to be appropriate. Please contact me with any questions.  Heron Leavell RN MSN Admissions Coordinator [phone]

## 2023-05-11 NOTE — Procedures (Signed)
 Extubation Procedure Note  Patient Details:   Name: PAMELA MADDY DOB: 1965-11-19 MRN: [id number]   Airway Documentation:    Vent end date: 05/11/23 Vent end time: 1155   Evaluation  O2 sats: stable throughout Complications: No apparent complications Patient did tolerate procedure well. Bilateral Breath Sounds: Rhonchi   Yes  Pt was extubated to 2L [place] without any complications. Dyspnea or stridor noted. Pt was instructed on IS by RN. Positive cuff leak prior to extubation.  Charmaine Bars 05/11/2023, 12:01 PM

## 2023-05-11 NOTE — Progress Notes (Signed)
 Patient ID: Dylan Owen, male   DOB: [birth year], 58 y.o.   MRN: [id number] [facility] Progress Note   Assessment/ Plan:   1. Acute kidney Injury: With essentially normal renal function at baseline (creatinine 1.1) and currently anuric.  His data appears to be most consistent with ATN from shock and the plan is to continue CRRT at this time for management of azotemia and volume unloading.  Labs/patient reviewed, no changes to CRRT prescription at this time. 2.  Mixed cardiogenic/septic shock: Clinically suspected to be associated with viral myocarditis/acute CHF exacerbation.  Continue efforts at extracorporeal volume unloading with CRRT. 3.  Shock liver: Transaminases appear to be trending down with hemodynamic support.  Weaned off pressors. 4.  Acute hypoxic/hypercarbic respiratory failure: Ongoing efforts at volume unloading with CRRT/ultrafiltration and possible plans noted for extubation today based on improvement of mental status/respiratory parameters.  Subjective:   Weaned off pressors yesterday and tolerating CRRT with aggressive ultrafiltration.   Objective:   BP (!) 171/82   Pulse 74   Temp 97.7 F (36.5 C) (Axillary)   Resp 18   Ht 6' 1 (1.854 m)   Wt 129.5 kg   SpO2 98%   BMI 37.67 kg/m   Intake/Output Summary (Last 24 hours) at 05/11/2023 0741 Last data filed at 05/11/2023 0700 Gross per 24 hour  Intake 1570.79 ml  Output 7548.5 ml  Net -5977.71 ml   Weight change: -9.1 kg  Physical Exam: Gen: Awake/alert, appears comfortable on ventilator via ET tube CVS: Pulse regular rhythm/normal rate, S1 and S2 normal Resp: Anteriorly clear to auscultation, no rales/rhonchi Abd: Soft, obese, nontender, bowel sounds normal Ext: 2+ lower extremity edema, 2-3+ upper extremity edema with some abdominal wall edema  Imaging: DG Abd 1 View Result Date: 05/11/2023 CLINICAL DATA:  Ileus.  Sudden emesis. EXAM: ABDOMEN - 1 VIEW COMPARISON:  05/09/2023 FINDINGS:  Gaseous distention of the stomach may indicate outlet obstruction or dysmotility. An enteric tube is present with tip projecting over the upper stomach. Gas-filled small and large bowel are not abnormally distended, likely ileus. No radiopaque stones. Degenerative changes and postoperative changes in the spine. IMPRESSION: 1. Gaseous distention of the stomach, possibly obstruction or dysmotility. 2. Gas-filled nondistended small and large bowel are likely ileus. 3. Enteric tube tip projects over the upper stomach. Electronically Signed   By: Elsie Gravely M.D.   On: 05/11/2023 01:05   DG Abd 1 View Result Date: 05/09/2023 CLINICAL DATA:  Orogastric tube placement. EXAM: ABDOMEN - 1 VIEW COMPARISON:  CT earlier today FINDINGS: Tip of the enteric tube is below the diaphragm in the stomach, the side port is not well visualized but likely also in the stomach. 1 gaseous gastric distension. No other bowel dilatation in the upper abdomen. IMPRESSION: Tip of the enteric tube below the diaphragm in the stomach, the side port is difficult to define but likely below the diaphragm. Electronically Signed   By: Andrea Gasman M.D.   On: 05/09/2023 18:40   CT CHEST ABDOMEN PELVIS WO CONTRAST Result Date: 05/09/2023 CLINICAL DATA:  Sepsis EXAM: CT CHEST, ABDOMEN AND PELVIS WITHOUT CONTRAST TECHNIQUE: Multidetector CT imaging of the chest, abdomen and pelvis was performed following the standard protocol without IV contrast. RADIATION DOSE REDUCTION: This exam was performed according to the departmental dose-optimization program which includes automated exposure control, adjustment of the mA and/or kV according to patient size and/or use of iterative reconstruction technique. COMPARISON:  Chest CT 12/23/2004 FINDINGS: CT CHEST FINDINGS Cardiovascular: Heart  is mildly enlarged. There is no pericardial effusion. Aorta is normal in size. Right upper extremity PICC terminates at the cavoatrial junction. Left-sided central venous  catheter tip ends in the SVC. Mediastinum/Nodes: There is an enlarged subcarinal lymph node measuring 18 mm short axis. There are nonenlarged, but prominent paratracheal and prevascular lymph nodes. Difficult to assess for hilar adenopathy secondary to lack contrast. Enteric tube seen throughout a nondilated esophagus. Visualized thyroid  gland is within normal limits. Endotracheal tube tip is 3.5 cm above the carina. Lungs/Pleura: There is patchy airspace consolidation throughout the bilateral lower lobes. Multifocal ill-defined ground-glass, airspace and ill-defined nodular densities are seen throughout the right upper lobe. There is no pleural effusion or pneumothorax identified. Musculoskeletal: There are mild compression deformities of T10 and T11. T11 is stable from prior. Two hundred ten is new from 2006, but favored as chronic. CT ABDOMEN PELVIS FINDINGS Hepatobiliary: No focal liver abnormality is seen. No gallstones, gallbladder wall thickening, or biliary dilatation. Pancreas: Unremarkable. No pancreatic ductal dilatation or surrounding inflammatory changes. Spleen: Normal in size without focal abnormality. Adrenals/Urinary Tract: The bladder is decompressed by Foley catheter. There is mild fat stranding surrounding the bladder. The kidneys and adrenal glands are within normal limits. Stomach/Bowel: Stomach is within normal limits. Enteric tube tip is in the proximal body of the stomach. Appendix appears normal. No evidence of bowel wall thickening, distention, or inflammatory changes. Vascular/Lymphatic: Aortic atherosclerosis. No enlarged abdominal or pelvic lymph nodes. Reproductive: Prostate is unremarkable. Other: There small fat containing inguinal hernias. There is mild diffuse body wall edema. Musculoskeletal: L3-L5 posterior fusion hardware is present IMPRESSION: 1. Multifocal pneumonia. 2. Mediastinal adenopathy, likely reactive. 3. Mild fat stranding surrounding the bladder. Correlate clinically  for cystitis. 4. Mild diffuse body wall edema. 5. Aortic atherosclerosis. Aortic Atherosclerosis (ICD10-I70.0). Electronically Signed   By: Greig Pique M.D.   On: 05/09/2023 18:17   Portable Chest x-ray Result Date: 05/09/2023 CLINICAL DATA:  Intubation. EXAM: PORTABLE CHEST 1 VIEW COMPARISON:  Chest radiograph dated 02/25. FINDINGS: Endotracheal tube with tip approximately 3.5 cm above the carina. Enteric tube extends below the diaphragm with tip beyond the inferior margin of the image. Additional support a products in similar position. Evaluation of the lungs is limited due to overlying cover. Bilateral mid to lower lung field interstitial densities may represent atelectasis or infiltrate. No pleural effusion or pneumothorax. Stable cardiac silhouette. No acute osseous pathology. IMPRESSION: 1. Endotracheal tube with tip approximately 3.5 cm above the carina. 2. Bilateral mid to lower lung field atelectasis or infiltrate. Electronically Signed   By: Vanetta Chou M.D.   On: 05/09/2023 14:20    Labs: BMET Recent Labs  Lab 05/08/23 1600 05/08/23 1620 05/09/23 0514 05/09/23 1253 05/09/23 1953 05/10/23 0455 05/10/23 0725 05/10/23 1553 05/11/23 0507  NA 138   < > 135 133* 135 136 134* 134* 132*  K 4.5   < > 4.7 4.6 4.9 5.2* 5.3* 5.0  4.9 4.8  CL 97*  --  100  --  102 102 98 100 99  CO2 24  --  22  --  19* 19* 21* 22 20*  GLUCOSE 105*  --  153*  --  145* 182* 186* 219* 210*  BUN 46*  --  35*  --  30* 29* 28* 30* 31*  CREATININE 5.22*  --  4.17*  --  3.23* 2.96* 2.75* 2.54* 2.25*  CALCIUM  7.5*  --  8.0*  --  8.1* 8.3* 8.7* 8.6* 8.5*  PHOS 5.5*  --  4.4  --  3.1 3.9  --  4.0 3.5   < > = values in this interval not displayed.   CBC Recent Labs  Lab 05/04/23 1645 05/05/23 0346 05/06/23 0504 05/06/23 2226 05/07/23 0059 05/07/23 1501 05/08/23 0508 05/08/23 1620 05/09/23 0509 05/09/23 0515 05/09/23 1253 05/10/23 0455  WBC 9.3 9.4   < > 12.8* 13.4*  --  13.2*  --   --  13.0*  --   7.4  NEUTROABS 7.4 6.9  --  9.5*  --   --   --   --   --   --   --   --   HGB 15.0 14.8   < > 16.8 17.3*   < > 13.2   < > 16.3 14.3 15.3 15.2  HCT 49.7 48.9   < > 57.6* 58.8*   < > 43.2   < > 48.0 46.8 45.0 47.6  MCV 86.9 87.5   < > 89.6 89.6  --  87.3  --   --  85.4  --  83.5  PLT 248 262   < > 316 291  --  208  --   --  237  --  140*   < > = values in this interval not displayed.    Medications:     aspirin   81 mg Per Tube Daily   Chlorhexidine  Gluconate Cloth  6 each Topical Daily   docusate  100 mg Per Tube BID   famotidine   20 mg Per Tube BID   feeding supplement (PROSource TF20)  60 mL Per Tube QID   heparin  injection (subcutaneous)  5,000 Units Subcutaneous Q8H   hydrocortisone  sod succinate (SOLU-CORTEF ) inj  100 mg Intravenous Q12H   insulin  aspart  0-15 Units Subcutaneous Q4H   lidocaine   1 patch Transdermal Q24H   multivitamin  1 tablet Per Tube QHS   mouth rinse  15 mL Mouth Rinse Q2H   oxyCODONE   10 mg Per Tube Q8H   pantoprazole  (PROTONIX ) IV  40 mg Intravenous Q24H   polyethylene glycol  17 g Per Tube Daily   sodium chloride  flush  10-40 mL Intracatheter Q12H    Gordy Blanch, MD 05/11/2023, 7:41 AM

## 2023-05-11 NOTE — Progress Notes (Signed)
   05/11/23 2221  Vent Select  Invasive or Noninvasive Noninvasive  Adult Vent Y  Adult Ventilator Settings  Vent Type Servo i  Vent Mode BIPAP;PCV  Set Rate 10 bmp  FiO2 (%) 40 %  I Time 0.9 Sec(s)  IPAP 10 cmH20  EPAP 5 cmH20  Pressure Control 5 cmH20  PEEP 5 cmH20  Adult Ventilator Measurements  Peak Airway Pressure 13 L/min  Mean Airway Pressure 6 cmH20  Resp Rate Spontaneous 12 br/min  Resp Rate Total 22 br/min  Exhaled Vt 650 mL  Measured Ve 13.5 L  I:E Ratio Measured 1:2.3  Auto PEEP 0 cmH20  Total PEEP 5 cmH20  Adult Ventilator Alarms  Alarms On Y  Ve High Alarm 30 L/min  Ve Low Alarm 4 L/min  Resp Rate High Alarm 38 br/min  Resp Rate Low Alarm 8  PEEP Low Alarm 3 cmH2O  Press High Alarm 35 cmH2O

## 2023-05-11 NOTE — Procedures (Signed)
 Cortrak  Tube Type:  Cortrak - 43 inches Tube Location:  Left nare Secured by: Bridle Cortrak Secured At:  88 cm   Cortrak Tube Team Note:  Consult received to place a Cortrak feeding tube.   X-ray is required, abdominal x-ray has been ordered by the Cortrak team. Please confirm tube placement before using the Cortrak tube.   If the tube becomes dislodged please keep the tube and contact the Cortrak team at [email or web address] for replacement.  If after hours and replacement cannot be delayed, place a NG tube and confirm placement with an abdominal x-ray.    Augustin Shams MS, RD, LDN If unable to be reached, please send secure chat to RD inpatient available from 8:00a-4:00p daily

## 2023-05-11 NOTE — Evaluation (Signed)
 Physical Therapy Evaluation Patient Details Name: Dylan Owen MRN: [id number] DOB: 08/15/1965 Today's Date: 05/11/2023  History of Present Illness  Pt is 58 yo presenting to [facility] as transfer from [facility] [place] 2/1 due to mixed septic and cardiogenic shock. Pt presented to [facility] hospital due to referral from MD on 05/05/23. Pt started on CRRT on 2/2, Intubated on 2/3 due to constant BiPAP use and unable to wean. Intubation was discontinued on 2/5. PMH: HTN, hyperlipidemia, DM II, sleep apnea, chronic back pain.  Clinical Impression  Pt is presenting below baseline level of functioning. Pt was able to perform bed mobility and sit to stand today at Min A to CGA for bed mobility and Min to Mod A for sit to stand from EOB with RW. Pt was unable to progress gait due to fatigue and incontinence. Pt has good family support from spouse and 3 adult children with a ramped entrance to home. Due to pt current functional status, home set up and available assistance at home recommending skilled physical therapy services < 3 hours/day in order to address strength, balance and functional mobility to decrease risk for falls, injury, immobility, skin break down and re-hospitalization.          If plan is discharge home, recommend the following: A lot of help with walking and/or transfers;Assistance with cooking/housework;Assist for transportation     Equipment Recommendations BSC/3in1;Wheelchair (measurements PT);Wheelchair cushion (measurements PT)  Recommendations for Other Services  Rehab consult    Functional Status Assessment Patient has had a recent decline in their functional status and demonstrates the ability to make significant improvements in function in a reasonable and predictable amount of time.     Precautions / Restrictions Precautions Precautions: Fall Precaution Comments: watch BP and O2, Pt is on CRRT Restrictions Weight Bearing Restrictions Per Provider Order: No      Mobility  Bed  Mobility Overal bed mobility: Needs Assistance Bed Mobility: Supine to Sit, Sit to Supine, Rolling Rolling: Mod assist, Min assist   Supine to sit: Min assist Sit to supine: Contact guard assist   General bed mobility comments: Min A for trunk with cueing initially for mobility and CGA for sitting to supine. Min to MOd A for rolling as pt fatigued during assist with pericare.    Transfers Overall transfer level: Needs assistance Equipment used: Rolling walker (2 wheels) Transfers: Sit to/from Stand Sit to Stand: Min assist, Mod assist           General transfer comment: Pt was MIn to MOd A for sit to stand from EOB with verbal cues for correct hand placement. Pt initially attempted standing 3x stating I can't stand, I'm too weak. With encouragement pt was able to stand EOB 2x with Mod A then Min A.    Ambulation/Gait     General Gait Details: deferred gait due to fatigue and incontinence.     Balance Overall balance assessment: Needs assistance Sitting-balance support: Single extremity supported, Feet supported, Bilateral upper extremity supported, Feet unsupported Sitting balance-Leahy Scale: Fair     Standing balance support: Bilateral upper extremity supported, Reliant on assistive device for balance Standing balance-Leahy Scale: Poor Standing balance comment: No overt LOB, Min A to maintain balance initially pt was able to progress to CGA then increased again to Mod A due to fatigue.       Pertinent Vitals/Pain Pain Assessment Pain Assessment: 0-10 Pain Score: 5  Pain Location: low back Pain Descriptors / Indicators: Aching Pain Intervention(s): Monitored during session, Limited  activity within patient's tolerance    Home Living Family/patient expects to be discharged to:: Private residence Living Arrangements: Spouse/significant other;Children (older childre 20-23-26) Available Help at Discharge: Family;Available 24 hours/day Type of Home: House Home  Access: Ramped entrance       Home Layout: One level Home Equipment: [employer]ant (2 wheels);Shower seat - built in;Cane - single point      Prior Function Prior Level of Function : Independent/Modified Independent;Driving     Mobility Comments: used cane for mobility ADLs Comments: Mod I for ADLs and IADL's.     Extremity/Trunk Assessment   Upper Extremity Assessment Upper Extremity Assessment: Defer to OT evaluation    Lower Extremity Assessment Lower Extremity Assessment: Generalized weakness    Cervical / Trunk Assessment Cervical / Trunk Assessment: Normal  Communication   Communication Communication: Difficulty communicating thoughts/reduced clarity of speech Cueing Techniques: Verbal cues;Gestural cues;Visual cues;Tactile cues  Cognition Arousal: Alert Behavior During Therapy: WFL for tasks assessed/performed Overall Cognitive Status: Within Functional Limits for tasks assessed        General Comments General comments (skin integrity, edema, etc.): pt O2 sats and HR remained WNL throughout session.        Assessment/Plan    PT Assessment Patient needs continued PT services  PT Problem List Decreased strength;Decreased balance;Decreased mobility;Decreased activity tolerance       PT Treatment Interventions DME instruction;Therapeutic activities;Gait training;Therapeutic exercise;Balance training;Functional mobility training;Patient/family education    PT Goals (Current goals can be found in the Care Plan section)  Acute Rehab PT Goals Patient Stated Goal: to improve mobility PT Goal Formulation: With patient Time For Goal Achievement: 05/25/23 Potential to Achieve Goals: Good    Frequency Min 1X/week        AM-PAC PT 6 Clicks Mobility  Outcome Measure Help needed turning from your back to your side while in a flat bed without using bedrails?: A Little Help needed moving from lying on your back to sitting on the side of a flat bed without  using bedrails?: A Little Help needed moving to and from a bed to a chair (including a wheelchair)?: A Lot Help needed standing up from a chair using your arms (e.g., wheelchair or bedside chair)?: A Lot Help needed to walk in hospital room?: Total Help needed climbing 3-5 steps with a railing? : Total 6 Click Score: 12    End of Session Equipment Utilized During Treatment: Gait belt;Oxygen Activity Tolerance: Patient tolerated treatment well;Patient limited by fatigue Patient left: in bed;with call bell/phone within reach;with bed alarm set;with family/visitor present;with nursing/sitter in room Nurse Communication: Mobility status PT Visit Diagnosis: Unsteadiness on feet (R26.81);Other abnormalities of gait and mobility (R26.89);Muscle weakness (generalized) (M62.81)    Time: 8583-8498 PT Time Calculation (min) (ACUTE ONLY): 45 min   Charges:   PT Evaluation $PT Eval Low Complexity: 1 Low PT Treatments $Therapeutic Activity: 38-52 mins PT General Charges $$ ACUTE PT VISIT: 1 Visit         Dylan Owen, DPT, CLT  Acute Rehabilitation Services Office: [phone] (Secure chat preferred)   Dylan Owen 05/11/2023, 4:26 PM

## 2023-05-11 NOTE — Progress Notes (Addendum)
 Advanced Heart Failure Rounding Note  HF Cardiologist: Dr. Zenaida   Chief Complaint: Mixed Cardiogenic and Septic Shock    Patient Profile   58 y.o. male with a PMH of obesity, HTN, HLD, DM2, and OSA admitted w/ mixed septic and cardiogenic shock>>progression to anuric renal failure requiring CRRT.  Interval Hx:    2/3 worsening septic shock>> fever up to 101, increase LA, escalation of pressors, delirium and worsening respiratory failure>>intubated. Milrinone  stopped due to frequent ectopy. Co-ox remained stable through the day in high 80s c/w septic shock. Abx broadened. Vanc added to meropenum. CT of C/A/P w/ multifocal PNA and s/o cystitis. 2/4 Marked improvement. Resolution of fever and delirium. Following commands. Weaned off pressors, became hypertensive and started on cleviprex  gtt   Subjective:    Remains intubated, awake and following commands.   C/w anuric renal failure, on CRRT currently pulling 250 cc/hr, total of 6.6L pulled off yesterday. Wt trending down. CVP 8   Remains on meropenum. Off Vanc. Afebrile.   Hypertensive, SBPs 170s. MAPs 100.   Co-ox remains stable off milrinone , 85%.   Objective:   Weight Range: 129.5 kg Body mass index is 37.67 kg/m.   Vital Signs:   Temp:  [97.7 F (36.5 C)-100.2 F (37.9 C)] 97.7 F (36.5 C) (02/05 0645) Pulse Rate:  [44-101] 83 (02/05 0805) Resp:  [10-24] 24 (02/05 0805) BP: (74-183)/(54-127) 128/104 (02/05 0805) SpO2:  [93 %-100 %] 95 % (02/05 0805) Arterial Line BP: (63-252)/(39-116) 173/80 (02/05 0700) FiO2 (%):  [40 %] 40 % (02/05 0805) Weight:  [129.5 kg] 129.5 kg (02/05 0448) Last BM Date : 05/09/23  Weight change: Filed Weights   05/09/23 0600 05/10/23 0500 05/11/23 0448  Weight: (!) 141.6 kg (!) 138.6 kg 129.5 kg    Intake/Output:   Intake/Output Summary (Last 24 hours) at 05/11/2023 0818 Last data filed at 05/11/2023 0700 Gross per 24 hour  Intake 1547.79 ml  Output 7291.5 ml  Net -5743.71 ml       Physical Exam    CVP 8 General:  obese WM, intubated, awake and following commands. No distress or agitation  HEENT: + ETT, otherwise normal Neck: supple. + LIJ HD cath, Thick neck, JVD not well visualized. Carotids 2+ bilat; no bruits. No lymphadenopathy or thyromegaly appreciated. Cor: PMI nondisplaced. Regular rate & rhythm. No rubs, gallops or murmurs. Lungs: intubated, course left anterior lung base  Abdomen: obese, soft, nontender, nondistended. No hepatosplenomegaly. No bruits or masses. Good bowel sounds. Extremities: no cyanosis, clubbing, rash, 1+ b/ LE edema, knees cool, + b/l SCDs + RUE PICC  Neuro: intubated, awake and follows commands appropriately    Labs    CBC Recent Labs    05/09/23 0515 05/09/23 1253 05/10/23 0455  WBC 13.0*  --  7.4  HGB 14.3 15.3 15.2  HCT 46.8 45.0 47.6  MCV 85.4  --  83.5  PLT 237  --  140*   Basic Metabolic Panel Recent Labs    97/95/74 0455 05/10/23 0725 05/10/23 1553 05/11/23 0507  NA 136   < > 134* 132*  K 5.2*   < > 5.0  4.9 4.8  CL 102   < > 100 99  CO2 19*   < > 22 20*  GLUCOSE 182*   < > 219* 210*  BUN 29*   < > 30* 31*  CREATININE 2.96*   < > 2.54* 2.25*  CALCIUM  8.3*   < > 8.6* 8.5*  MG 2.5*  --   --  2.6*  PHOS 3.9  --  4.0 3.5   < > = values in this interval not displayed.   Liver Function Tests Recent Labs    05/09/23 0514 05/09/23 1953 05/10/23 0455 05/10/23 1553 05/11/23 0507  AST 1,458*  --  982*  --   --   ALT 686*  --  564*  --   --   ALKPHOS 81  --  84  --   --   BILITOT 2.9*  --  3.0*  --   --   PROT 6.9  --  6.4*  --   --   ALBUMIN  2.9*   < > 2.4*  2.4* 2.6* 2.5*   < > = values in this interval not displayed.   No results for input(s): LIPASE, AMYLASE in the last 72 hours.  Cardiac Enzymes No results for input(s): CKTOTAL, CKMB, CKMBINDEX, TROPONINI in the last 72 hours.   BNP: BNP (last 3 results) Recent Labs    05/04/23 1645  BNP 1,275.2*    ProBNP (last 3  results) No results for input(s): PROBNP in the last 8760 hours.   D-Dimer No results for input(s): DDIMER in the last 72 hours. Hemoglobin A1C No results for input(s): HGBA1C in the last 72 hours. Fasting Lipid Panel No results for input(s): CHOL, HDL, LDLCALC, TRIG, CHOLHDL, LDLDIRECT in the last 72 hours.  Thyroid  Function Tests No results for input(s): TSH, T4TOTAL, T3FREE, THYROIDAB in the last 72 hours.  Invalid input(s): FREET3  Other results:    Medications:     Scheduled Medications:  aspirin   81 mg Per Tube Daily   Chlorhexidine  Gluconate Cloth  6 each Topical Daily   docusate  100 mg Per Tube BID   famotidine   20 mg Per Tube BID   feeding supplement (PROSource TF20)  60 mL Per Tube QID   heparin  injection (subcutaneous)  5,000 Units Subcutaneous Q8H   hydrocortisone  sod succinate (SOLU-CORTEF ) inj  50 mg Intravenous Q12H   insulin  aspart  0-15 Units Subcutaneous Q4H   lidocaine   1 patch Transdermal Q24H   multivitamin  1 tablet Per Tube QHS   mouth rinse  15 mL Mouth Rinse Q2H   oxyCODONE   10 mg Per Tube Q8H   pantoprazole  (PROTONIX ) IV  40 mg Intravenous Q24H   polyethylene glycol  17 g Per Tube Daily   sodium chloride  flush  10-40 mL Intracatheter Q12H    Infusions:  clevidipine  Stopped (05/10/23 2302)   dexmedetomidine  (PRECEDEX ) IV infusion Stopped (05/11/23 0558)   feeding supplement (VITAL 1.5 CAL) Stopped (05/10/23 2230)   fentaNYL  infusion INTRAVENOUS Stopped (05/11/23 0558)   meropenem  (MERREM ) IV Stopped (05/11/23 9383)   norepinephrine  (LEVOPHED ) Adult infusion Stopped (05/11/23 9346)   prismasol  BGK 2/2.5 replacement solution 400 mL/hr at 05/10/23 2243   prismasol  BGK 2/2.5 replacement solution 400 mL/hr at 05/10/23 2242   prismasol  BGK 4/2.5 1,500 mL/hr at 05/11/23 0658    PRN Medications: acetaminophen  (TYLENOL ) oral liquid 160 mg/5 mL, albuterol , fentaNYL , heparin , midazolam , nitroGLYCERIN , ondansetron   (ZOFRAN ) IV, mouth rinse, mouth rinse, mouth rinse, sodium chloride , sodium chloride  flush    Assessment/Plan   Mixed cardiogenic/septic shock: Presented febrile, cold on exam, with worsening blood pressure. TEE w/ moderately reduced LVEF. Initially on Milrinone . 2/3 developed worsening septic shock fever up to 101, increase in lactic acid, delirium, and worsening respiratory failure>>intubated. Milrinone  stopped due to frequent ectopy. Abx broadened. Vanc added to meropenum. CT of C/A/P w/ multifocal PNA and s/o cystitis. UA dirty,  Ucx multiple species, Respiratory Cx no growth, Bcx NGTD - continues to improve. Sepsis resolving. Suspect source PNA/UTI.  Now off Vanc. D/w CCM at bedside will plan narrow down abx from mero>>? Cefepime .  - CG shock resolved. Off milrinone  x 48 hr w/ stable co-ox 85% - Volume improving, CVP 8 today. Continue CRRT for volume removal, continue pulling 250 cc/hr  - restart cleviprex  gtt for HTN - CRP/ESR only mildly elevated, doubt myocarditis as initially suspected. Suspect likely stress induced CM in setting of sepsis. Will conduct repeat bedside echo to reassess EF     Acute hypoxic and hypercarbic respiratory failure:  Secondary to infection and acute heart failure. Intubated 2/3 - remains intubated, vent management per CCM  - continue abx per CCM  - CVP 8. Continue CRRT for volume removal  - ? Possible extubation today per CCM   Hypertension  - BPs now elevated, SBPs 170s and MAPs 100 - start Cleviprex  gtt   AKI: 2/2 mixed shock. Remains anuric.  - continue CRRT, nephrology following    Shock liver: - reassess today, HFTs ordered   - Shock management per above   Diabetes:  poorly controlled,  A1c 10.2   - insulin  management per CCM   Delirium:  - resolved - continue to treat infection   CRITICAL CARE Performed by: Caffie Shed, PA-C    Total critical care time: 20 minutes  Critical care time was exclusive of separately billable  procedures and treating other patients.  Critical care was necessary to treat or prevent imminent or life-threatening deterioration.  Critical care was time spent personally by me on the following activities: development of treatment plan with patient and/or surrogate as well as nursing, discussions with consultants, evaluation of patient's response to treatment, examination of patient, obtaining history from patient or surrogate, ordering and performing treatments and interventions, ordering and review of laboratory studies, ordering and review of radiographic studies, pulse oximetry and re-evaluation of patient's condition.    Length of Stay: [id number], [place]  05/11/2023, 8:18 AM  Advanced Heart Failure Team Pager [phone] (M-F; 7a - 5p)  Please contact [facility] for night-coverage after hours (5p -7a ) and weekends on [email or web address]

## 2023-05-11 NOTE — Progress Notes (Signed)
 Nutrition Follow-up  DOCUMENTATION CODES:   Not applicable  INTERVENTION:   Tube Feeding via Cortrak (post pyloric):  Vital 1.5 at 20 ml/hr today Goal: Vital 1.5 at 50 ml/hr with Pro-Source TF20 60 mL QID  TF at goal provides 2120 kcals, 161 g of protein and 912 mL of free water   Continue Renal MVI   NUTRITION DIAGNOSIS:   Inadequate oral intake related to acute illness as evidenced by NPO status.  Being addressed via TF   GOAL:   Patient will meet greater than or equal to 90% of their needs  Progressing  MONITOR:   TF tolerance, Vent status, Labs, Weight trends  REASON FOR ASSESSMENT:   Consult Assessment of nutrition requirement/status (CRRT)  ASSESSMENT:   59 yo male admitted with new HFrEF and developed cardiogenic and septic shock requiring transfer to ICU, vasopressors and progressive anuric AKI initiation of CRRT, +shock liver. PMH includes HTN, DM, obesity, OSA, B12 deficiency, +smokeless tobacco use  1/30 Admitted 1/31 BiPap-NPO 2/01 Transferred to ICU 2/02 CRRT initiated 2/03 Intubated, trickle TF initiated, CT: multifocal pneumonia, mild diffuse body wall edema 2/04 TF titrated towards goal, held overnight with 1L output from OG 2/05 Cortrak placed (post pyloric), Extubated  Extubated today, Cortrak placed with tip in duodenum. TF held overnight with 1L output from OG tube, unclear reasons for what prompted RN to place OG to LIS. Discussed plan with Dr. Harold and plan to resume trickles this afternoon  +BMs, abdomen soft, BS present  Remains on CRRT with 250 ml/hr UF  +device related pressure injury to left cheek and nose from BiPap  Weight down to 129.5 kg with diuresis; noted weight has not been below 140 kg in the last several years based on weight encounters. Current dry weight unknown.   Labs: CBGs 136-223, BUN 31, Creatinine 2.25, sodium 132 (L), potassium 4.8 (wdl) Meds: ss novolog , rena-vite  Diet Order:   Diet Order              Diet NPO time specified  Diet effective now                   EDUCATION NEEDS:   Not appropriate for education at this time  Skin:  Skin Assessment: Skin Integrity Issues: Skin Integrity Issues:: Other (Comment) Other: device related pressure injury to bridge of nose and L cheek from BiPap mask  Last BM:  2/5 large type 7  Height:   Ht Readings from Last 1 Encounters:  05/09/23 6' 1 (1.854 m)    Weight:   Wt Readings from Last 1 Encounters:  05/11/23 129.5 kg    BMI:  Body mass index is 37.67 kg/m.  Estimated Nutritional Needs:   Kcal:  2100-2400 kcals  Protein:  150-180 g  Fluid:  1.5 L   Betsey Finger MS, RDN, LDN, CNSC Registered Dietitian 3 Clinical Nutrition RD Inpatient Contact Info in Amion

## 2023-05-11 NOTE — Progress Notes (Signed)
 NAME:  Dylan Owen, MRN:  [id number], DOB:  [birth year], LOS: 6 ADMISSION DATE:  05/04/2023, CONSULTATION DATE:  05/07/23 REFERRING MD:  Nilda, CHIEF COMPLAINT:  resp failure shock    History of Present Illness:  58 year old male with significant obesity, hypertension, hyperlipidemia, type 2 diabetes, sleep apnea, chronic back pain, depression, acid reflux.  Also depression significant home medications include Lasix , hydrochlorothiazide .  Lisinopril , metoprolol , nitroglycerin  but also Robaxin  and MS Contin  30 mg twice daily along with Repatha .  And Effexor .  Along with insulin  for diabetes.  She he walks around with a cane at baseline.  He also admits to medication noncompliance.  And 10 pound weight gain he is not on oxygen at home.  His hemoglobin A1c was 10.2 at admission consistent with very poor compliance.   Admitted on 05/04/2023 with complaints of cough, sore throat, dizziness and bodyaches for 1 week.  Also included chest tightness headaches chills diaphoresis.  At the time of arrival to the ED also had dyspnea on exertion.  At admission BNP was 1275.  Respiratory virus panel was negative.  He was started on Lasix  60 twice daily and his home lisinopril .  He was initially hypertensive and with this management blood pressure improved.  Echocardiogram showed low ejection fraction approximately 25% although poor acoustic windows.  Chest x-ray suggestive of venous congestion.  Cardiology consultation 05/06/2023 noted net -1.6 L since admission and patient was given history of increased frequency of urination.  Diagnose of viral myocarditis was made and left a right heart catheterization was considered.  Troponin barely elevated and consistent with viral myocarditis.   Postadmission with diuresis as of 05/06/2023 creatinine rose up to 1.57 mg percent [baseline 1 mg percent].  At this point lisinopril  was held.  811/31/25 developed hypoglycemia requiring infusion of dextrose  and transferred to the  intensive care unit.  Post transfer to the intensive care unit he developed acute fever and also BiPAP dependency.  Along with worsening renal failure further to creatinine of 2.83 mg percent along with persistent lactic acidosis around 3.  And mild intermittent delirium.  Overnight diaphoresis reported but despite all this nursing consistently reports that his periphery is extremely cold.     Cardiology has been reconsulted.  Critical care medicine consulted.  Pertinent  Medical History  B12 deficiency, Blood transfusion without reported diagnosis, Chronic low back pain, Depression, Diabetes mellitus with complication (HCC), GERD (gastroesophageal reflux disease), Heart murmur, History of adenomatous polyp of colon (05/22/2021), Hypercholesterolemia, Hypertension, OSA (obstructive sleep apnea), Peripheral neuropathy, Persistent asthma, Seizures (HCC), Stable angina (HCC), Statin intolerance, and Subclinical hypothyroidism (06/2017).    reports that he has never smoked. His smokeless tobacco use includes chew.  Significant Hospital Events: Including procedures, antibiotic start and stop dates in addition to other pertinent events   1/29 admit to [facility], diuresis 1/30 ECHO w LVEF 25% 1/31 cards consult, c/f viral myocarditis  2/1 PCCM consult txf to [facility], Adv HF consult  2/2 for HD cath placement and CRRT. Incr D10 for persistent hypogly   2/3 milrinone  stopped. Weaning NE; intubated due to being on bipap for a few days and needing to go to CT   Interim History / Subjective:   Having some anxiety; placed on precedex  Started on SBT this am On CRRT  Objective   Blood pressure (!) 171/82, pulse 74, temperature 97.7 F (36.5 C), temperature source Axillary, resp. rate 18, height 6' 1 (1.854 m), weight 129.5 kg, SpO2 98%. CVP:  [4 mmHg-58 mmHg] 5 mmHg  Vent Mode: PRVC FiO2 (%):  [40 %] 40 % Set Rate:  [18 bmp-24 bmp] 18 bmp Vt Set:  [640 mL] 640 mL PEEP:  [5 cmH20-10 cmH20] 5 cmH20 Plateau  Pressure:  [18 cmH20-26 cmH20] 22 cmH20   Intake/Output Summary (Last 24 hours) at 05/11/2023 0737 Last data filed at 05/11/2023 0700 Gross per 24 hour  Intake 1570.79 ml  Output 7548.5 ml  Net -5977.71 ml   Filed Weights   05/09/23 0600 05/10/23 0500 05/11/23 0448  Weight: (!) 141.6 kg (!) 138.6 kg 129.5 kg    Examination: General:  critically ill appearing on mech vent HEENT: MM pink/moist; ETT in place; some breakdown on bridge of nose from bipap Neuro: Alert; MAE CV: s1s2, RRR, no m/r/g PULM:  dim clear BS bilaterally; on mech vent SBT GI: soft, bsx4 active  Extremities: warm/dry, trace edema   Resolved Hospital Problem list   Hypomag   Assessment & Plan:   Acute encephalopathy, metabolic -CT H without acute abnormality 2/1 -think uremia, acidosis, sepsis are driving. Does have a slightly elevated ammonia, but only to 40 & I doubt this is greatly contributory  P: -delirium precautions -limit sedating meds  Septic shock -- UTI and multifocal pna Acute HFrEF -- possible cardiogenic? septic cardiomyopathy? -initial c/f myocarditis less favored  P: -off pressors; map goal >65 -meropenem  added 2/3; mrsa pcr negative 2/4 and dc'd -follow cultures  Acute hypoxic and hypercarbic resp failure Hx OSA Pulm edema Possible CAP  -RVP covid flu neg  P: -SBT today; consider extubation -CRRT for volume removal -LTVV strategy with tidal volumes of 6-8 cc/kg ideal body weight -Wean PEEP/FiO2 for SpO2 >92% -VAP bundle in place -Daily SAT and SBT -PAD protocol in place -wean sedation for RASS goal 0 to -1 -abx as above; trach aspirate sent 2/3 cont to follow  AKI, likely ATN  Hyperphosphatemia Hypocalcemia  P: -CRRT per nephro -Trend BMP / urinary output -Replace electrolytes as indicated -Avoid nephrotoxic agents, ensure adequate renal perfusion  Elevated LFTs -- shock liver +/- congestive hepatopathy Mild hyperbilirubinemia Coagulopathy Mild Hyperammonemia  -acute  hep neg  P: -Trend cmp and coags  Hypoglycemia DMT2 P: -now hyperglycemic; ssi added yesterday -cbg monitoring  Code status discussion / GOC  P: -spoke w wife 2/3 who verified Full code/ full scope of practice    Best Practice (right click and Reselect all SmartList Selections daily)   Diet/type: tubefeeds DVT prophylaxis LMWH Pressure ulcer(s): pressure ulcer assessment deferred  GI prophylaxis: PPI Lines: Central line, Dialysis Catheter, and yes and it is still needed Foley:  Yes, and it is still needed Code Status:  full code Last date of multidisciplinary goals of care discussion [wife updated 2/4]  Labs   CBC: Recent Labs  Lab 05/04/23 1645 05/05/23 0346 05/06/23 0504 05/06/23 2226 05/07/23 0059 05/07/23 1501 05/08/23 0508 05/08/23 1620 05/09/23 0509 05/09/23 0515 05/09/23 1253 05/10/23 0455  WBC 9.3 9.4   < > 12.8* 13.4*  --  13.2*  --   --  13.0*  --  7.4  NEUTROABS 7.4 6.9  --  9.5*  --   --   --   --   --   --   --   --   HGB 15.0 14.8   < > 16.8 17.3*   < > 13.2 16.3 16.3 14.3 15.3 15.2  HCT 49.7 48.9   < > 57.6* 58.8*   < > 43.2 48.0 48.0 46.8 45.0 47.6  MCV 86.9 87.5   < >  89.6 89.6  --  87.3  --   --  85.4  --  83.5  PLT 248 262   < > 316 291  --  208  --   --  237  --  140*   < > = values in this interval not displayed.    Basic Metabolic Panel: Recent Labs  Lab 05/07/23 0059 05/07/23 1501 05/08/23 0508 05/08/23 1600 05/09/23 0514 05/09/23 1253 05/09/23 1953 05/10/23 0455 05/10/23 0725 05/10/23 1553 05/11/23 0507  NA 138   < > 135   < > 135   < > 135 136 134* 134* 132*  K 4.7   < > 4.1   < > 4.7   < > 4.9 5.2* 5.3* 5.0  4.9 4.8  CL 99  --  91*   < > 100  --  102 102 98 100 99  CO2 21*  --  23   < > 22  --  19* 19* 21* 22 20*  GLUCOSE 100*  --  271*   < > 153*  --  145* 182* 186* 219* 210*  BUN 28*  --  42*   < > 35*  --  30* 29* 28* 30* 31*  CREATININE 2.83*  --  5.05*   < > 4.17*  --  3.23* 2.96* 2.75* 2.54* 2.25*  CALCIUM  9.2   --  6.6*   < > 8.0*  --  8.1* 8.3* 8.7* 8.6* 8.5*  MG 2.1  --  1.5*  --  2.1  --   --  2.5*  --   --  2.6*  PHOS  --   --   --    < > 4.4  --  3.1 3.9  --  4.0 3.5   < > = values in this interval not displayed.   GFR: Estimated Creatinine Clearance: 51.1 mL/min (A) (by C-G formula based on SCr of 2.25 mg/dL (H)). Recent Labs  Lab 05/06/23 2226 05/07/23 0059 05/07/23 0527 05/07/23 1601 05/08/23 0508 05/09/23 0514 05/09/23 0515 05/09/23 1210 05/10/23 0455  PROCALCITON 0.21  --   --   --  1.09  --   --   --   --   WBC 12.8* 13.4*  --   --  13.2*  --  13.0*  --  7.4  LATICACIDVEN 3.7* 3.8*   < > 1.4 1.7 2.8*  --  1.4  --    < > = values in this interval not displayed.    Liver Function Tests: Recent Labs  Lab 05/07/23 0059 05/08/23 0508 05/08/23 1600 05/09/23 0514 05/09/23 1953 05/10/23 0455 05/10/23 1553 05/11/23 0507  AST 1,258* 1,792* 1,882* 1,458*  --  982*  --   --   ALT 454* 695* 786* 686*  --  564*  --   --   ALKPHOS 72 66 81 81  --  84  --   --   BILITOT 3.2* 2.5* 2.9* 2.9*  --  3.0*  --   --   PROT 7.5 7.6 7.0 6.9  --  6.4*  --   --   ALBUMIN  3.6 2.7* 3.0*  3.0* 2.9* 2.4* 2.4*  2.4* 2.6* 2.5*   Recent Labs  Lab 05/08/23 0508  LIPASE 24   Recent Labs  Lab 05/07/23 0836 05/08/23 1600 05/09/23 0953 05/10/23 0455  AMMONIA <10 40* <10 32    ABG    Component Value Date/Time   PHART 7.403 05/09/2023 1253   PCO2ART 41.4 05/09/2023  1253   PO2ART 282 (H) 05/09/2023 1253   HCO3 25.8 05/09/2023 1253   TCO2 27 05/09/2023 1253   ACIDBASEDEF 3.0 (H) 05/09/2023 0509   O2SAT 84.6 05/11/2023 0507     Coagulation Profile: Recent Labs  Lab 05/07/23 0836 05/08/23 1600  INR 1.7* 2.2*    Cardiac Enzymes: Recent Labs  Lab 05/08/23 0508  CKTOTAL 320  CKMB 3.7    HbA1C: Hemoglobin A1C  Date/Time Value Ref Range Status  11/05/2022 01:58 PM 9.1 (A) 4.0 - 5.6 % Final  08/05/2022 01:54 PM 8.1 (A) 4.0 - 5.6 % Final   HbA1c, POC (prediabetic range)   Date/Time Value Ref Range Status  11/05/2022 01:58 PM 9.1 (A) 5.7 - 6.4 % Final  08/05/2022 01:54 PM 8.1 (A) 5.7 - 6.4 % Final   HbA1c, POC (controlled diabetic range)  Date/Time Value Ref Range Status  11/05/2022 01:58 PM 9.1 (A) 0.0 - 7.0 % Final  08/05/2022 01:54 PM 8.1 (A) 0.0 - 7.0 % Final   HbA1c POC (<> result, manual entry)  Date/Time Value Ref Range Status  11/05/2022 01:58 PM 9.1 4.0 - 5.6 % Final  08/05/2022 01:54 PM 8.1 4.0 - 5.6 % Final   Hgb A1c MFr Bld  Date/Time Value Ref Range Status  05/05/2023 03:46 AM 10.2 (H) 4.8 - 5.6 % Final    Comment:    (NOTE) Pre diabetes:          5.7%-6.4%  Diabetes:              >6.4%  Glycemic control for   <7.0% adults with diabetes     CBG: Recent Labs  Lab 05/10/23 1155 05/10/23 1557 05/10/23 1940 05/11/23 0027 05/11/23 0439  GLUCAP 221* 219* 195* 211* 223*     Critical care time: 35 minutes     JD Emilio RIGGERS [facility] 05/11/2023, 7:37 AM  Please see [email or web address] for pager details.  From 7A-7P if no response, please call [phone]. After hours, please call ELink [phone].

## 2023-05-12 ENCOUNTER — Inpatient Hospital Stay (HOSPITAL_COMMUNITY): Payer: PPO

## 2023-05-12 DIAGNOSIS — I5023 Acute on chronic systolic (congestive) heart failure: Secondary | ICD-10-CM

## 2023-05-12 DIAGNOSIS — R570 Cardiogenic shock: Secondary | ICD-10-CM | POA: Diagnosis not present

## 2023-05-12 DIAGNOSIS — J9601 Acute respiratory failure with hypoxia: Secondary | ICD-10-CM

## 2023-05-12 DIAGNOSIS — I5021 Acute systolic (congestive) heart failure: Secondary | ICD-10-CM

## 2023-05-12 DIAGNOSIS — E871 Hypo-osmolality and hyponatremia: Secondary | ICD-10-CM

## 2023-05-12 DIAGNOSIS — G9340 Encephalopathy, unspecified: Secondary | ICD-10-CM | POA: Diagnosis not present

## 2023-05-12 DIAGNOSIS — N179 Acute kidney failure, unspecified: Secondary | ICD-10-CM | POA: Diagnosis not present

## 2023-05-12 DIAGNOSIS — J189 Pneumonia, unspecified organism: Secondary | ICD-10-CM

## 2023-05-12 LAB — RENAL FUNCTION PANEL
Albumin: 2.6 g/dL — ABNORMAL LOW (ref 3.5–5.0)
Albumin: 2.5 g/dL — ABNORMAL LOW (ref 3.5–5.0)
Anion gap: 14 (ref 5–15)
Anion gap: 14 (ref 5–15)
BUN: 31 mg/dL — ABNORMAL HIGH (ref 6–20)
BUN: 38 mg/dL — ABNORMAL HIGH (ref 6–20)
CO2: 19 mmol/L — ABNORMAL LOW (ref 22–32)
CO2: 19 mmol/L — ABNORMAL LOW (ref 22–32)
Calcium: 8.3 mg/dL — ABNORMAL LOW (ref 8.9–10.3)
Calcium: 8.2 mg/dL — ABNORMAL LOW (ref 8.9–10.3)
Chloride: 101 mmol/L (ref 98–111)
Chloride: 100 mmol/L (ref 98–111)
Creatinine, Ser: 2.46 mg/dL — ABNORMAL HIGH (ref 0.61–1.24)
Creatinine, Ser: 2.49 mg/dL — ABNORMAL HIGH (ref 0.61–1.24)
GFR, Estimated: 30 mL/min — ABNORMAL LOW (ref >60)
GFR, Estimated: 29 mL/min — ABNORMAL LOW (ref >60)
Glucose, Bld: 226 mg/dL — ABNORMAL HIGH (ref 70–99)
Glucose, Bld: 270 mg/dL — ABNORMAL HIGH (ref 70–99)
Phosphorus: 1.7 mg/dL — ABNORMAL LOW (ref 2.5–4.6)
Phosphorus: 1.6 mg/dL — ABNORMAL LOW (ref 2.5–4.6)
Potassium: 3.9 mmol/L (ref 3.5–5.1)
Potassium: 4.3 mmol/L (ref 3.5–5.1)
Sodium: 134 mmol/L — ABNORMAL LOW (ref 135–145)
Sodium: 133 mmol/L — ABNORMAL LOW (ref 135–145)

## 2023-05-12 LAB — GLUCOSE, CAPILLARY
Glucose-Capillary: 200 mg/dL — ABNORMAL HIGH (ref 70–99)
Glucose-Capillary: 224 mg/dL — ABNORMAL HIGH (ref 70–99)
Glucose-Capillary: 180 mg/dL — ABNORMAL HIGH (ref 70–99)
Glucose-Capillary: 257 mg/dL — ABNORMAL HIGH (ref 70–99)
Glucose-Capillary: 191 mg/dL — ABNORMAL HIGH (ref 70–99)

## 2023-05-12 LAB — HEPATIC FUNCTION PANEL
ALT: 307 U/L — ABNORMAL HIGH (ref 0–44)
AST: 261 U/L — ABNORMAL HIGH (ref 15–41)
Albumin: 2.5 g/dL — ABNORMAL LOW (ref 3.5–5.0)
Alkaline Phosphatase: 116 U/L (ref 38–126)
Bilirubin, Direct: 1.7 mg/dL — ABNORMAL HIGH (ref 0.0–0.2)
Indirect Bilirubin: 1.6 mg/dL — ABNORMAL HIGH (ref 0.3–0.9)
Total Bilirubin: 3.3 mg/dL — ABNORMAL HIGH (ref 0.0–1.2)
Total Protein: 7 g/dL (ref 6.5–8.1)

## 2023-05-12 LAB — CULTURE, BLOOD (ROUTINE X 2)
Culture: NO GROWTH
Culture: NO GROWTH

## 2023-05-12 LAB — POCT I-STAT 7, (LYTES, BLD GAS, ICA,H+H)
Acid-base deficit: 4 mmol/L — ABNORMAL HIGH (ref 0.0–2.0)
Bicarbonate: 18.8 mmol/L — ABNORMAL LOW (ref 20.0–28.0)
Calcium, Ion: 1.11 mmol/L — ABNORMAL LOW (ref 1.15–1.40)
HCT: 52 % (ref 39.0–52.0)
Hemoglobin: 17.7 g/dL — ABNORMAL HIGH (ref 13.0–17.0)
O2 Saturation: 92 %
Patient temperature: 98
Potassium: 3.6 mmol/L (ref 3.5–5.1)
Sodium: 136 mmol/L (ref 135–145)
TCO2: 20 mmol/L — ABNORMAL LOW (ref 22–32)
pCO2 arterial: 28.8 mm[Hg] — ABNORMAL LOW (ref 32–48)
pH, Arterial: 7.421 (ref 7.35–7.45)
pO2, Arterial: 61 mm[Hg] — ABNORMAL LOW (ref 83–108)

## 2023-05-12 LAB — PROTIME-INR
INR: 1.1 (ref 0.8–1.2)
Prothrombin Time: 14.1 s (ref 11.4–15.2)

## 2023-05-12 LAB — CBC
HCT: 51.1 % (ref 39.0–52.0)
Hemoglobin: 16 g/dL (ref 13.0–17.0)
MCH: 26 pg (ref 26.0–34.0)
MCHC: 31.3 g/dL (ref 30.0–36.0)
MCV: 83.1 fL (ref 80.0–100.0)
Platelets: 127 10*3/uL — ABNORMAL LOW (ref 150–400)
RBC: 6.15 MIL/uL — ABNORMAL HIGH (ref 4.22–5.81)
RDW: 18.1 % — ABNORMAL HIGH (ref 11.5–15.5)
WBC: 9.6 10*3/uL (ref 4.0–10.5)
nRBC: 0.2 % (ref 0.0–0.2)

## 2023-05-12 LAB — MAGNESIUM: Magnesium: 2.5 mg/dL — ABNORMAL HIGH (ref 1.7–2.4)

## 2023-05-12 LAB — COOXEMETRY PANEL
Carboxyhemoglobin: 1.6 % — ABNORMAL HIGH (ref 0.5–1.5)
Methemoglobin: 1.2 % (ref 0.0–1.5)
O2 Saturation: 74 %
Total hemoglobin: 16.2 g/dL — ABNORMAL HIGH (ref 12.0–16.0)

## 2023-05-12 LAB — TRIGLYCERIDES: Triglycerides: 200 mg/dL — ABNORMAL HIGH (ref <150)

## 2023-05-12 LAB — ECHOCARDIOGRAM LIMITED
Height: 73 in
S' Lateral: 4.1 cm
Weight: 4402.15 [oz_av]

## 2023-05-12 MED ORDER — ASPIRIN 81 MG PO TBEC
81.0000 mg | DELAYED_RELEASE_TABLET | Freq: Every day | ORAL | Status: DC
  Administered 2023-05-13 – 2023-05-22 (×10): 81 mg via ORAL
  Filled 2023-05-12 (×10): qty 1

## 2023-05-12 MED ORDER — AMIODARONE HCL IN DEXTROSE 360-4.14 MG/200ML-% IV SOLN
60.0000 mg/h | INTRAVENOUS | Status: DC
  Administered 2023-05-12 (×2): 60 mg/h via INTRAVENOUS
  Filled 2023-05-12 (×2): qty 200

## 2023-05-12 MED ORDER — HYDRALAZINE HCL 25 MG PO TABS
25.0000 mg | ORAL_TABLET | Freq: Three times a day (TID) | ORAL | Status: DC
  Administered 2023-05-12: 25 mg via ORAL
  Filled 2023-05-12: qty 1

## 2023-05-12 MED ORDER — SODIUM CHLORIDE 0.9 % IV SOLN
1.0000 g | Freq: Two times a day (BID) | INTRAVENOUS | Status: AC
  Administered 2023-05-12 – 2023-05-14 (×5): 1 g via INTRAVENOUS
  Filled 2023-05-12 (×5): qty 20

## 2023-05-12 MED ORDER — OXYCODONE HCL 5 MG PO TABS
10.0000 mg | ORAL_TABLET | Freq: Four times a day (QID) | ORAL | Status: DC
  Administered 2023-05-12 – 2023-05-29 (×62): 10 mg via ORAL
  Filled 2023-05-12 (×63): qty 2

## 2023-05-12 MED ORDER — PERFLUTREN LIPID MICROSPHERE
1.0000 mL | INTRAVENOUS | Status: AC | PRN
  Administered 2023-05-12: 10 mL via INTRAVENOUS

## 2023-05-12 MED ORDER — ORAL CARE MOUTH RINSE: 15.0000 mL | OROMUCOSAL | Status: DC | PRN

## 2023-05-12 MED ORDER — ORAL CARE MOUTH RINSE
15.0000 mL | OROMUCOSAL | Status: DC
  Administered 2023-05-12 – 2023-05-13 (×3): 15 mL via OROMUCOSAL

## 2023-05-12 MED ORDER — ORAL CARE MOUTH RINSE
15.0000 mL | OROMUCOSAL | Status: DC | PRN
Start: 2023-05-12 — End: 2023-05-13

## 2023-05-12 MED ORDER — PRISMASOL BGK 4/2.5 32-4-2.5 MEQ/L REPLACEMENT SOLN: Status: DC

## 2023-05-12 MED ORDER — HYDRALAZINE HCL 25 MG PO TABS: 25.0000 mg | ORAL_TABLET | Freq: Three times a day (TID) | ORAL | Status: DC

## 2023-05-12 MED ORDER — RENA-VITE PO TABS
1.0000 | ORAL_TABLET | Freq: Every day | ORAL | Status: DC
  Administered 2023-05-12 – 2023-05-28 (×16): 1 via ORAL
  Filled 2023-05-12 (×16): qty 1

## 2023-05-12 MED ORDER — HYDRALAZINE HCL 25 MG PO TABS
25.0000 mg | ORAL_TABLET | Freq: Three times a day (TID) | ORAL | Status: DC
  Administered 2023-05-12 (×2): 25 mg via ORAL
  Filled 2023-05-12 (×3): qty 1

## 2023-05-12 MED ORDER — BACITRACIN ZINC 500 UNIT/GM EX OINT
TOPICAL_OINTMENT | Freq: Two times a day (BID) | CUTANEOUS | Status: DC
  Administered 2023-05-12 – 2023-05-13 (×3): 31.5 via TOPICAL
  Administered 2023-05-14: 1 via TOPICAL
  Administered 2023-05-14 – 2023-05-29 (×28): 31.5 via TOPICAL
  Filled 2023-05-12: qty 28.4

## 2023-05-12 MED ORDER — AMIODARONE LOAD VIA INFUSION
150.0000 mg | Freq: Once | INTRAVENOUS | Status: AC
  Administered 2023-05-12: 150 mg via INTRAVENOUS
  Filled 2023-05-12: qty 83.34

## 2023-05-12 MED ORDER — HYDROMORPHONE HCL 1 MG/ML IJ SOLN
0.5000 mg | INTRAMUSCULAR | Status: DC | PRN
  Administered 2023-05-12 – 2023-05-13 (×3): 0.5 mg via INTRAVENOUS
  Administered 2023-05-13: 1 mg via INTRAVENOUS
  Filled 2023-05-12 (×4): qty 1

## 2023-05-12 MED ORDER — METHOCARBAMOL 500 MG PO TABS
500.0000 mg | ORAL_TABLET | Freq: Two times a day (BID) | ORAL | Status: DC
  Administered 2023-05-12 – 2023-05-29 (×32): 500 mg via ORAL
  Filled 2023-05-12 (×33): qty 1

## 2023-05-12 MED ORDER — ACETAMINOPHEN 160 MG/5ML PO SOLN: 650.0000 mg | Freq: Four times a day (QID) | ORAL | Status: DC | PRN

## 2023-05-12 MED ORDER — METOPROLOL TARTRATE 5 MG/5ML IV SOLN: 2.5000 mg | INTRAVENOUS | Status: DC | PRN

## 2023-05-12 MED ORDER — ENSURE ENLIVE PO LIQD
237.0000 mL | Freq: Three times a day (TID) | ORAL | Status: DC
  Administered 2023-05-12 – 2023-05-15 (×6): 237 mL via ORAL

## 2023-05-12 MED ORDER — AMIODARONE HCL IN DEXTROSE 360-4.14 MG/200ML-% IV SOLN
60.0000 mg/h | INTRAVENOUS | Status: DC
Start: 2023-05-12 — End: 2023-05-15
  Administered 2023-05-13: 30 mg/h via INTRAVENOUS
  Administered 2023-05-13 – 2023-05-15 (×6): 60 mg/h via INTRAVENOUS
  Filled 2023-05-12 (×9): qty 200

## 2023-05-12 NOTE — Progress Notes (Signed)
 NAME:  Dylan Owen, MRN:  [id number], DOB:  1965/12/24, LOS: 7 ADMISSION DATE:  05/04/2023, CONSULTATION DATE:  05/07/23 REFERRING MD:  Nilda, CHIEF COMPLAINT:  resp failure shock    History of Present Illness:  58 year old male with significant obesity, hypertension, hyperlipidemia, type 2 diabetes, sleep apnea, chronic back pain, depression, acid reflux.  Also depression significant home medications include Lasix , hydrochlorothiazide .  Lisinopril , metoprolol , nitroglycerin  but also Robaxin  and MS Contin  30 mg twice daily along with Repatha .  And Effexor .  Along with insulin  for diabetes.  She he walks around with a cane at baseline.  He also admits to medication noncompliance.  And 10 pound weight gain he is not on oxygen at home.  His hemoglobin A1c was 10.2 at admission consistent with very poor compliance.   Admitted on 05/04/2023 with complaints of cough, sore throat, dizziness and bodyaches for 1 week.  Also included chest tightness headaches chills diaphoresis.  At the time of arrival to the ED also had dyspnea on exertion.  At admission BNP was 1275.  Respiratory virus panel was negative.  He was started on Lasix  60 twice daily and his home lisinopril .  He was initially hypertensive and with this management blood pressure improved.  Echocardiogram showed low ejection fraction approximately 25% although poor acoustic windows.  Chest x-ray suggestive of venous congestion.  Cardiology consultation 05/06/2023 noted net -1.6 L since admission and patient was given history of increased frequency of urination.  Diagnose of viral myocarditis was made and left a right heart catheterization was considered.  Troponin barely elevated and consistent with viral myocarditis.   Postadmission with diuresis as of 05/06/2023 creatinine rose up to 1.57 mg percent [baseline 1 mg percent].  At this point lisinopril  was held.  811/31/25 developed hypoglycemia requiring infusion of dextrose  and transferred to the  intensive care unit.  Post transfer to the intensive care unit he developed acute fever and also BiPAP dependency.  Along with worsening renal failure further to creatinine of 2.83 mg percent along with persistent lactic acidosis around 3.  And mild intermittent delirium.  Overnight diaphoresis reported but despite all this nursing consistently reports that his periphery is extremely cold.     Cardiology has been reconsulted.  Critical care medicine consulted.  Pertinent  Medical History  B12 deficiency, Blood transfusion without reported diagnosis, Chronic low back pain, Depression, Diabetes mellitus with complication (HCC), GERD (gastroesophageal reflux disease), Heart murmur, History of adenomatous polyp of colon (05/22/2021), Hypercholesterolemia, Hypertension, OSA (obstructive sleep apnea), Peripheral neuropathy, Persistent asthma, Seizures (HCC), Stable angina (HCC), Statin intolerance, and Subclinical hypothyroidism (06/2017).    reports that he has never smoked. His smokeless tobacco use includes chew.  Significant Hospital Events: Including procedures, antibiotic start and stop dates in addition to other pertinent events   1/29 admit to [facility], diuresis 1/30 ECHO w LVEF 25% 1/31 cards consult, c/f viral myocarditis  2/1 PCCM consult txf to [facility], Adv HF consult  2/2 for HD cath placement and CRRT. Incr D10 for persistent hypogly   2/3 milrinone  stopped. Weaning NE; intubated due to being on bipap for a few days and needing to go to CT  2/5 extubated. Cleviprex  for hypertension   2/6 CT H for anisocoria, no acute findings. Off clevi. Adding PRN metop. CRRT UF decr to 150/hr.   Interim History / Subjective:  This morning had some slight anisocoria. Went for CT H which had no acute intracranial abnormality   Coox 74  Trigs 200   Objective  Blood pressure 123/74, pulse (!) 109, temperature (!) 100.4 F (38 C), temperature source Axillary, resp. rate (!) 22, height 6' 1 (1.854 m), weight  124.8 kg, SpO2 95%. CVP:  [0 mmHg-19 mmHg] 1 mmHg  Vent Mode: BIPAP;PSV FiO2 (%):  [40 %] 40 % Set Rate:  [10 bmp-14 bmp] 14 bmp PEEP:  [5 cmH20] 5 cmH20 Pressure Support:  [5 cmH20] 5 cmH20   Intake/Output Summary (Last 24 hours) at 05/12/2023 0804 Last data filed at 05/12/2023 0600 Gross per 24 hour  Intake 1236.81 ml  Output 5937.9 ml  Net -4701.09 ml   Filed Weights   05/10/23 0500 05/11/23 0448 05/12/23 0500  Weight: (!) 138.6 kg 129.5 kg 124.8 kg    Examination: General:  Chronically and critically ill obese adult M NAD  HEENT: NCAT pink mm anicteric sclera  Neuro: AAOx3  CV: tachycardic, s1s2. Some PVCs  PULM:  Diminished bilaterally. No accessory musce use  GI: soft ndnt  GU: foley  Extremities: no acute joint deformity   Resolved Hospital Problem list   Hypomag   Assessment & Plan:   Acute metabolic encephalopathy -CT H without acute abnormality 2/1. Subsequent CT H 2/6 also stable (precipitated by mild anisocoria)  -think uremia, acidosis, sepsis were drivers. Mildly elevated ammonia - doubt hugely contributory. Mentation improved w tx sepsis and CRRT   P: -Delirium precautions -supportive care as below   Septic shock, UTI + multifocal PNA  -- shock improved  P: -mero. Hesitant to de-escalate -- we saw dramatic improvement w mero initiation. Difficult w no cx data to reveal what we are tx, but would favor cont mero for 7d course abx   Acute hypoxic and hypercarbic resp failure Hx OSA Multifocal PNA  Pulm edema  -RVP covid flu neg, trach asp no orgs but was obtained after a few days of abx   P: -pulm hygiene, IS -noct BiPAP  -abx as above   Acute HFrEF -- septic cardiomyopathy  HTN  -initial c/f myocarditis less favored  -after being weaned off pressors, req cleviprex  gtt which is now off. Some labile pressures.  -2/6 trigs are elevated   P -repeat echo this admission  -will dc clevi gtt and add PTN metop. Think he would also likely benefit from  starting something [facility] -- will see where his pressures level out when CRRT is restarted and d/w HF   AKI - likely ATN  NAGMA Hyper/hypophos Hypermagnesemia P: -CRRT per nephro  -follow renal fxn  -replace lytes   Elevated LFTs Mild hyperbilirubinemia Mild hyperammonemia  -acute hep neg. Shock liver most favored. Possible congestive hepatopathy component but if septic CM less favored   P: -PRN LFTs   DM2 P: -now hyperglycemic; ssi added yesterday -cbg monitoring  Elevated trigs -dc cleviprex  gtt   Code status discussion / GOC  P: -spoke w wife 2/3 who verified Full code/ full scope of practice   L/T/D -PICC - If we can get adequate PIVs, might be able to dc this if we have completed our need for coox trending (will dw HF)  -temp HD - cont  -art line - pending his BP w addition of antihypertensives, might be able to dc this  -foley - d/w neprho, cont for now   Best Practice (right click and Reselect all SmartList Selections daily)   Diet/type: Regular consistency (see orders) DVT prophylaxis LMWH Pressure ulcer(s): pressure ulcer assessment deferred  GI prophylaxis: PPI Lines: art line Central line, Dialysis Catheter, and yes and it  is still needed Foley:  Yes, and it is still needed Code Status:  full code Last date of multidisciplinary goals of care discussion [wife updated 2/4]  Labs   CBC: Recent Labs  Lab 05/06/23 2226 05/07/23 0059 05/08/23 0508 05/08/23 1620 05/09/23 0515 05/09/23 1253 05/10/23 0455 05/11/23 0918 05/12/23 0506  WBC 12.8*   < > 13.2*  --  13.0*  --  7.4 7.9 9.6  NEUTROABS 9.5*  --   --   --   --   --   --   --   --   HGB 16.8   < > 13.2   < > 14.3 15.3 15.2 15.8 16.0  HCT 57.6*   < > 43.2   < > 46.8 45.0 47.6 49.4 51.1  MCV 89.6   < > 87.3  --  85.4  --  83.5 83.2 83.1  PLT 316   < > 208  --  237  --  140* 128* 127*   < > = values in this interval not displayed.    Basic Metabolic Panel: Recent Labs  Lab 05/08/23 0508  05/08/23 1600 05/09/23 0514 05/09/23 1253 05/10/23 0455 05/10/23 0725 05/10/23 1553 05/11/23 0507 05/11/23 1634 05/12/23 0506  NA 135   < > 135   < > 136 134* 134* 132* 135 134*  K 4.1   < > 4.7   < > 5.2* 5.3* 5.0  4.9 4.8 4.5 3.9  CL 91*   < > 100   < > 102 98 100 99 101 101  CO2 23   < > 22   < > 19* 21* 22 20* 20* 19*  GLUCOSE 271*   < > 153*   < > 182* 186* 219* 210* 140* 226*  BUN 42*   < > 35*   < > 29* 28* 30* 31* 32* 31*  CREATININE 5.05*   < > 4.17*   < > 2.96* 2.75* 2.54* 2.25* 2.37* 2.46*  CALCIUM  6.6*   < > 8.0*   < > 8.3* 8.7* 8.6* 8.5* 8.3* 8.3*  MG 1.5*  --  2.1  --  2.5*  --   --  2.6*  --  2.5*  PHOS  --    < > 4.4   < > 3.9  --  4.0 3.5 2.1* 1.7*   < > = values in this interval not displayed.   GFR: Estimated Creatinine Clearance: 45.9 mL/min (A) (by C-G formula based on SCr of 2.46 mg/dL (H)). Recent Labs  Lab 05/06/23 2226 05/07/23 0059 05/07/23 1601 05/08/23 0508 05/09/23 0514 05/09/23 0515 05/09/23 1210 05/10/23 0455 05/11/23 0918 05/12/23 0506  PROCALCITON 0.21  --   --  1.09  --   --   --   --   --   --   WBC 12.8*   < >  --  13.2*  --  13.0*  --  7.4 7.9 9.6  LATICACIDVEN 3.7*   < > 1.4 1.7 2.8*  --  1.4  --   --   --    < > = values in this interval not displayed.    Liver Function Tests: Recent Labs  Lab 05/08/23 0508 05/08/23 1600 05/09/23 0514 05/09/23 1953 05/10/23 0455 05/10/23 1553 05/11/23 0507 05/11/23 1634 05/12/23 0506  AST 1,792* 1,882* 1,458*  --  982*  --   --   --  261*  ALT 695* 786* 686*  --  564*  --   --   --  307*  ALKPHOS 66 81 81  --  84  --   --   --  116  BILITOT 2.5* 2.9* 2.9*  --  3.0*  --   --   --  3.3*  PROT 7.6 7.0 6.9  --  6.4*  --   --   --  7.0  ALBUMIN  2.7* 3.0*  3.0* 2.9*   < > 2.4*  2.4* 2.6* 2.5* 2.5* 2.5*  2.6*   < > = values in this interval not displayed.   Recent Labs  Lab 05/08/23 0508  LIPASE 24   Recent Labs  Lab 05/07/23 0836 05/08/23 1600 05/09/23 0953 05/10/23 0455   AMMONIA <10 40* <10 32    ABG    Component Value Date/Time   PHART 7.403 05/09/2023 1253   PCO2ART 41.4 05/09/2023 1253   PO2ART 282 (H) 05/09/2023 1253   HCO3 25.8 05/09/2023 1253   TCO2 27 05/09/2023 1253   ACIDBASEDEF 3.0 (H) 05/09/2023 0509   O2SAT 74 05/12/2023 0506     Coagulation Profile: Recent Labs  Lab 05/07/23 0836 05/08/23 1600 05/12/23 0506  INR 1.7* 2.2* 1.1    Cardiac Enzymes: Recent Labs  Lab 05/08/23 0508  CKTOTAL 320  CKMB 3.7    HbA1C: Hemoglobin A1C  Date/Time Value Ref Range Status  11/05/2022 01:58 PM 9.1 (A) 4.0 - 5.6 % Final  08/05/2022 01:54 PM 8.1 (A) 4.0 - 5.6 % Final   HbA1c, POC (prediabetic range)  Date/Time Value Ref Range Status  11/05/2022 01:58 PM 9.1 (A) 5.7 - 6.4 % Final  08/05/2022 01:54 PM 8.1 (A) 5.7 - 6.4 % Final   HbA1c, POC (controlled diabetic range)  Date/Time Value Ref Range Status  11/05/2022 01:58 PM 9.1 (A) 0.0 - 7.0 % Final  08/05/2022 01:54 PM 8.1 (A) 0.0 - 7.0 % Final   HbA1c POC (<> result, manual entry)  Date/Time Value Ref Range Status  11/05/2022 01:58 PM 9.1 4.0 - 5.6 % Final  08/05/2022 01:54 PM 8.1 4.0 - 5.6 % Final   Hgb A1c MFr Bld  Date/Time Value Ref Range Status  05/05/2023 03:46 AM 10.2 (H) 4.8 - 5.6 % Final    Comment:    (NOTE) Pre diabetes:          5.7%-6.4%  Diabetes:              >6.4%  Glycemic control for   <7.0% adults with diabetes     CBG: Recent Labs  Lab 05/11/23 1116 05/11/23 1527 05/11/23 1947 05/11/23 2332 05/12/23 0420  GLUCAP 136* 127* 172* 179* 200*    CRITICAL CARE Performed by: Ronnald FORBES Gave   Total critical care time: 42 minutes  Critical care time was exclusive of separately billable procedures and treating other patients.  Critical care was necessary to treat or prevent imminent or life-threatening deterioration.  Critical care was time spent personally by me on the following activities: development of treatment plan with patient and/or  surrogate as well as nursing, discussions with consultants, evaluation of patient's response to treatment, examination of patient, obtaining history from patient or surrogate, ordering and performing treatments and interventions, ordering and review of laboratory studies, ordering and review of radiographic studies, pulse oximetry and re-evaluation of patient's condition.  Ronnald Gave MSN, AGACNP-BC [facility] [facility] Amion for pager  05/12/2023, 8:04 AM

## 2023-05-12 NOTE — Progress Notes (Addendum)
 Advanced Heart Failure Rounding Note  HF Cardiologist: Dr. Zenaida   Chief Complaint: Mixed Cardiogenic and Septic Shock    Patient Profile   58 y.o. male with a PMH of obesity, HTN, HLD, DM2, and OSA admitted w/ mixed septic and cardiogenic shock>>progression to anuric renal failure requiring CRRT.  Interval Hx:    2/3 worsening septic shock>> fever up to 101, increase LA, escalation of pressors, delirium and worsening respiratory failure>>intubated. Milrinone  stopped due to frequent ectopy. Co-ox remained stable through the day in high 80s c/w septic shock. Abx broadened. Vanc added to meropenum. CT of C/A/P w/ multifocal PNA and s/o cystitis. 2/4 Marked improvement. Resolution of fever and delirium. Weaned off pressors, became hypertensive and started on cleviprex  gtt  2/5 Extubated, weaned off Cleviprex , Cortrak placed (post pyloric)  Overnight: CT of Head ordered, pt appeared more altered to nursing staff + neurologic deficit w/ unequal pupil reaction. CT showed no acute abnormality.    Subjective:    Awake and following commands, though a bit slow to respond. No current complaints. Denies CP and dyspnea.   C/w anuric renal failure. CRRT paused to allow for head CT.   Remains on meropenum. Had documented low grade fever ~4AM, 100.4. BCx NGTD. WBC WNL.   Remains hypertensive, off Cleviprex , SBPs 160s.   Co-ox trending down but ok at 74% today. CVP 8    Objective:   Weight Range: 124.8 kg Body mass index is 36.3 kg/m.   Vital Signs:   Temp:  [98.5 F (36.9 C)-100.4 F (38 C)] 100.4 F (38 C) (02/06 0400) Pulse Rate:  [60-121] 109 (02/06 0730) Resp:  [11-39] 22 (02/06 0730) BP: (80-139)/(45-94) 123/74 (02/06 0700) SpO2:  [89 %-99 %] 95 % (02/06 0730) Arterial Line BP: (91-207)/(47-84) 159/61 (02/06 0730) FiO2 (%):  [40 %] 40 % (02/06 0400) Weight:  [124.8 kg] 124.8 kg (02/06 0500) Last BM Date : 05/11/23  Weight change: Filed Weights   05/10/23 0500  05/11/23 0448 05/12/23 0500  Weight: (!) 138.6 kg 129.5 kg 124.8 kg    Intake/Output:   Intake/Output Summary (Last 24 hours) at 05/12/2023 0809 Last data filed at 05/12/2023 0600 Gross per 24 hour  Intake 1236.81 ml  Output 5937.9 ml  Net -4701.09 ml      Physical Exam    CVP 8 General:  fatigued appearing. No respiratory difficulty HEENT: normal + cortrak  Neck: supple. Thick neck JVD not well visualized, + LIJ HD cath. Carotids 2+ bilat; no bruits. No lymphadenopathy or thyromegaly appreciated. Cor: PMI nondisplaced. Regular rate & rhythm. No rubs, gallops or murmurs. Lungs: decreased BS at the bases bilaterally Abdomen: soft, nontender, nondistended. No hepatosplenomegaly. No bruits or masses. Good bowel sounds. Extremities: no cyanosis, clubbing, rash, edema + SCDs  Neuro: alert & oriented x 3, cranial nerves grossly intact. moves all 4 extremities w/o difficulty. Affect flat  Labs    CBC Recent Labs    05/11/23 0918 05/12/23 0506  WBC 7.9 9.6  HGB 15.8 16.0  HCT 49.4 51.1  MCV 83.2 83.1  PLT 128* 127*   Basic Metabolic Panel Recent Labs    97/94/74 0507 05/11/23 1634 05/12/23 0506  NA 132* 135 134*  K 4.8 4.5 3.9  CL 99 101 101  CO2 20* 20* 19*  GLUCOSE 210* 140* 226*  BUN 31* 32* 31*  CREATININE 2.25* 2.37* 2.46*  CALCIUM  8.5* 8.3* 8.3*  MG 2.6*  --  2.5*  PHOS 3.5 2.1* 1.7*   Liver  Function Tests Recent Labs    05/10/23 0455 05/10/23 1553 05/11/23 1634 05/12/23 0506  AST 982*  --   --  261*  ALT 564*  --   --  307*  ALKPHOS 84  --   --  116  BILITOT 3.0*  --   --  3.3*  PROT 6.4*  --   --  7.0  ALBUMIN  2.4*  2.4*   < > 2.5* 2.5*  2.6*   < > = values in this interval not displayed.   No results for input(s): LIPASE, AMYLASE in the last 72 hours.  Cardiac Enzymes No results for input(s): CKTOTAL, CKMB, CKMBINDEX, TROPONINI in the last 72 hours.   BNP: BNP (last 3 results) Recent Labs    05/04/23 1645  BNP 1,275.2*     ProBNP (last 3 results) No results for input(s): PROBNP in the last 8760 hours.   D-Dimer No results for input(s): DDIMER in the last 72 hours. Hemoglobin A1C No results for input(s): HGBA1C in the last 72 hours. Fasting Lipid Panel Recent Labs    05/12/23 0506  TRIG 200*    Thyroid  Function Tests No results for input(s): TSH, T4TOTAL, T3FREE, THYROIDAB in the last 72 hours.  Invalid input(s): FREET3  Other results:    Medications:     Scheduled Medications:  aspirin   81 mg Per Tube Daily   Chlorhexidine  Gluconate Cloth  6 each Topical Daily   feeding supplement (PROSource TF20)  60 mL Per Tube QID   feeding supplement (VITAL 1.5 CAL)  1,000 mL Per Tube Q24H   heparin  injection (subcutaneous)  5,000 Units Subcutaneous Q8H   hydrALAZINE   25 mg Oral Q8H   insulin  aspart  0-15 Units Subcutaneous Q4H   lidocaine   1 patch Transdermal Q24H   methocarbamol   500 mg Per Tube BID   multivitamin  1 tablet Per Tube QHS   oxyCODONE   10 mg Per Tube Q6H   pantoprazole  (PROTONIX ) IV  40 mg Intravenous Q24H    Infusions:   prismasol  BGK 4/2.5      prismasol  BGK 4/2.5     meropenem  (MERREM ) IV     prismasol  BGK 4/2.5 1,500 mL/hr at 05/12/23 0518    PRN Medications: acetaminophen  (TYLENOL ) oral liquid 160 mg/5 mL, albuterol , heparin , metoprolol  tartrate, morphine  injection, naLOXone  (NARCAN )  injection, nitroGLYCERIN , ondansetron  (ZOFRAN ) IV, mouth rinse, sodium chloride     Assessment/Plan   Mixed cardiogenic/septic shock: Presented febrile, cold on exam, with worsening blood pressure. TEE w/ moderately reduced LVEF. Initially on Milrinone . 2/3 developed worsening septic shock fever up to 101, increase in lactic acid, delirium, and worsening respiratory failure>>intubated. Milrinone  stopped due to frequent ectopy. Abx broadened. Vanc added to meropenum. CT of C/A/P w/ multifocal PNA and s/o cystitis. UA dirty, Ucx multiple species, Respiratory Cx no growth,  Bcx NGTD - continues to improve. Sepsis resolving. Suspect source PNA/UTI.  Now off Vanc. Remains on meropenum. Abx per CCM  - CG shock resolved. Co-ox remains stable off milrinone  at 74%  - C/w anuric renal failure, continue CRRT for volume management. Per nephrology, will reduced pull rate to 150 cc/hr  - needs afterload reduction  - start hydralazine  25 mg tid - CRP/ESR only mildly elevated, doubt myocarditis as initially suspected. Suspect likely stress induced CM in setting of sepsis. Will conduct repeat limited echo today    Acute hypoxic and hypercarbic respiratory failure:  Secondary to infection and acute heart failure. Intubated 2/3 - extubated 2/5 - stable on 5L [place]  -  continue abx per CCM  - Continue CRRT for volume removal    Hypertension  - BP remains elevated off Cleviprex  - he is now extubated w/ cortrak, will start PO afterload reduction/HFGDMT - start hydralazine  25 mg bid. May eventually need Imdur   AKI: suspect ATN 2/2 mixed shock. Remains anuric.  - continue CRRT, nephrology following  - per nephrology, reduce pull rate to 150 cc/hr    Shock liver: - 2/2 mixed shock/hepatic congestion from CHF  - LFTs trending down - continue tx of septic shock + volume removal   Diabetes:  poorly controlled,  A1c 10.2   - insulin  management per CCM   Delirium:  - resolved - continue to treat infection   Deconditioning - appears extremely weak - will need PT/OT   Length of Stay: [address] PA-C  05/12/2023, 8:09 AM  Advanced Heart Failure Team Pager [phone] (M-F; 7a - 5p)  Please contact [facility] for night-coverage after hours (5p -7a ) and weekends on [email or web address]

## 2023-05-12 NOTE — Progress Notes (Signed)
 Patient shivering, I asked if they were cold. Pt stated no. I asked if they were in pain, pt stated no. I assessed pt pupils. Left pupil was larger than right pupil and sluggish. I notified Elink RN Stefanie of the change. Elink MD called back and had me reassess pupil, left was more brisk but still larger. MD asked me to reassess in 15 minutes. Upon reassessment pt left pupil was still larger but both pupils were brisk. CT was ordered.

## 2023-05-12 NOTE — Plan of Care (Signed)
 Difficulty w art line throughout last half of shift, now not working well for IBP monitoring   Will dc and rely on our NIBP    Eston Hence MSN, AGACNP-BC [facility] Pulmonary/Critical Care Medicine 05/12/2023, 5:45 PM

## 2023-05-12 NOTE — Progress Notes (Signed)
 Echocardiogram 2D Echocardiogram has been performed.  Dylan Owen 05/12/2023, 5:10 PM

## 2023-05-12 NOTE — Progress Notes (Signed)
 Brief Nutrition Follow-up:  RD received message from RN stating that pt pulled out his Cortrak this afternoon. Pt had been tolerating trickle TF post re-initiation yesterday post extubation. Remains on CRRT  Today pt is alert, diet had been advanced to Heart Healthy/Carb Modified. Pt ate some of his lunch tray and drank 100% of Ensure Shake.   Given pt appears to be tolerating po diet, eating some and drinking Ensure; recommend holding off on reinsertion of NG/Cortrak  Noted pt with rectal pouch with +liquid stool; monitor post TF discontinuation  Interventions:  1) Recommend liberalizing diet to Carb Modified only; if po intake inadequate, recommend liberalizing to Regular until po intake improves. 2) Ensure Enlive po TID, each supplement provides 350 kcal and 20 grams of protein. 3) Continue Renal MVI 4) D/C TF orders for now   Betsey Finger MS, RDN, LDN, CNSC Registered Dietitian 3 Clinical Nutrition RD Inpatient Contact Info in Amion

## 2023-05-12 NOTE — Plan of Care (Signed)
 Problem: Fluid Volume: Goal: Ability to maintain a balanced intake and output will improve Outcome: Progressing   Problem: Clinical Measurements: Goal: Respiratory complications will improve Outcome: Progressing   Problem: Education: Goal: Ability to describe self-care measures that may prevent or decrease complications (Diabetes Survival Skills Education) will improve Outcome: Not Progressing Goal: Individualized Educational Video(s) Outcome: Not Progressing   Problem: Coping: Goal: Ability to adjust to condition or change in health will improve Outcome: Not Progressing   Problem: Health Behavior/Discharge Planning: Goal: Ability to identify and utilize available resources and services will improve Outcome: Not Progressing Goal: Ability to manage health-related needs will improve Outcome: Not Progressing   Problem: Metabolic: Goal: Ability to maintain appropriate glucose levels will improve Outcome: Not Progressing   Problem: Nutritional: Goal: Maintenance of adequate nutrition will improve Outcome: Not Progressing Goal: Progress toward achieving an optimal weight will improve Outcome: Not Progressing   Problem: Skin Integrity: Goal: Risk for impaired skin integrity will decrease Outcome: Not Progressing   Problem: Tissue Perfusion: Goal: Adequacy of tissue perfusion will improve Outcome: Not Progressing   Problem: Education: Goal: Knowledge of General Education information will improve Description: Including pain rating scale, medication(s)/side effects and non-pharmacologic comfort measures Outcome: Not Progressing   Problem: Health Behavior/Discharge Planning: Goal: Ability to manage health-related needs will improve Outcome: Not Progressing   Problem: Clinical Measurements: Goal: Ability to maintain clinical measurements within normal limits will improve Outcome: Not Progressing Goal: Will remain free from infection Outcome: Not Progressing Goal:  Diagnostic test results will improve Outcome: Not Progressing Goal: Cardiovascular complication will be avoided Outcome: Not Progressing   Problem: Activity: Goal: Risk for activity intolerance will decrease Outcome: Not Progressing   Problem: Nutrition: Goal: Adequate nutrition will be maintained Outcome: Not Progressing   Problem: Coping: Goal: Level of anxiety will decrease Outcome: Not Progressing   Problem: Elimination: Goal: Will not experience complications related to bowel motility Outcome: Not Progressing Goal: Will not experience complications related to urinary retention Outcome: Not Progressing   Problem: Pain Managment: Goal: General experience of comfort will improve and/or be controlled Outcome: Not Progressing   Problem: Safety: Goal: Ability to remain free from injury will improve Outcome: Not Progressing   Problem: Skin Integrity: Goal: Risk for impaired skin integrity will decrease Outcome: Not Progressing   Problem: Education: Goal: Knowledge of General Education information will improve Description: Including pain rating scale, medication(s)/side effects and non-pharmacologic comfort measures Outcome: Not Progressing   Problem: Health Behavior/Discharge Planning: Goal: Ability to manage health-related needs will improve Outcome: Not Progressing   Problem: Clinical Measurements: Goal: Ability to maintain clinical measurements within normal limits will improve Outcome: Not Progressing Goal: Will remain free from infection Outcome: Not Progressing Goal: Diagnostic test results will improve Outcome: Not Progressing Goal: Respiratory complications will improve Outcome: Not Progressing Goal: Cardiovascular complication will be avoided Outcome: Not Progressing   Problem: Activity: Goal: Risk for activity intolerance will decrease Outcome: Not Progressing   Problem: Nutrition: Goal: Adequate nutrition will be maintained Outcome: Not  Progressing   Problem: Coping: Goal: Level of anxiety will decrease Outcome: Not Progressing   Problem: Elimination: Goal: Will not experience complications related to bowel motility Outcome: Not Progressing Goal: Will not experience complications related to urinary retention Outcome: Not Progressing   Problem: Pain Managment: Goal: General experience of comfort will improve and/or be controlled Outcome: Not Progressing   Problem: Safety: Goal: Ability to remain free from injury will improve Outcome: Not Progressing   Problem: Skin Integrity: Goal: Risk for  impaired skin integrity will decrease Outcome: Not Progressing   Problem: Education: Goal: Knowledge of disease and its progression will improve Outcome: Not Progressing   Problem: Health Behavior/Discharge Planning: Goal: Ability to manage health-related needs will improve Outcome: Not Progressing   Problem: Clinical Measurements: Goal: Complications related to the disease process or treatment will be avoided or minimized Outcome: Not Progressing Goal: Dialysis access will remain free of complications Outcome: Not Progressing

## 2023-05-12 NOTE — Progress Notes (Signed)
 Patient ID: VILAS EDGERLY, male   DOB: 1965/10/04, 58 y.o.   MRN: [id number] [facility] Progress Note   Assessment/ Plan:   1. Acute kidney Injury: With essentially normal renal function at baseline (creatinine 1.1) and currently anuric.  History/timeline support ATN in the setting of shock; continue CRRT for her and additional 24 hours with reduction of ultrafiltration goal 250 cc/h and change of pre-/post filter fluids to 4K.  Improving hemodynamic status and the plan is to discontinue CRRT within the next 24 hours and monitor him through the weekend. 2.  Mixed cardiogenic/septic shock: Clinically suspected to be associated with viral myocarditis/acute CHF exacerbation.  Restart CRRT for continued ultrafiltration. 3.  Shock liver: Transaminases appear to be trending down with hemodynamic support.  Hemodynamically improving. 4.  Acute hypoxic/hypercarbic respiratory failure: Extubated yesterday and on oxygen supplementation via nasal cannula along with NIPPV overnight.  Subjective:   Extubated yesterday morning and on BiPAP overnight.  Underwent CT scan of the head for anisocoria without evidence of acute intracranial lesion.   Objective:   BP 123/74   Pulse (!) 109   Temp (!) 100.4 F (38 C) (Axillary)   Resp (!) 22   Ht 6' 1 (1.854 m)   Wt 124.8 kg   SpO2 95%   BMI 36.30 kg/m   Intake/Output Summary (Last 24 hours) at 05/12/2023 0749 Last data filed at 05/12/2023 0600 Gross per 24 hour  Intake 1242.99 ml  Output 6189.3 ml  Net -4946.31 ml   Weight change: -4.7 kg  Physical Exam: Gen: Awake/alert, resting comfortably in bed sipping on water  CVS: Pulse regular tachycardia S1 and S2 normal Resp: Anteriorly clear to auscultation, no rales/rhonchi Abd: Soft, obese, nontender, bowel sounds normal Ext: 1-2+ lower extremity edema, 2+ upper extremity edema with 1+ dependent edema  Imaging: CT HEAD WO CONTRAST ( ) Result Date: 05/12/2023 CLINICAL DATA:  58 year old  male altered mental status, neurologic deficit. EXAM: CT HEAD WITHOUT CONTRAST TECHNIQUE: Contiguous axial images were obtained from the base of the skull through the vertex without intravenous contrast. RADIATION DOSE REDUCTION: This exam was performed according to the departmental dose-optimization program which includes automated exposure control, adjustment of the mA and/or kV according to patient size and/or use of iterative reconstruction technique. COMPARISON:  05/07/2023 head CT and earlier. FINDINGS: Brain: Patchy subcortical white matter hypodensity in the anterior left frontal lobe appears stable since 05/06/2023. Mild additional patchy white matter hypodensity including in the bilateral internal capsules also stable. Small area of left caudate hypodensity is stable. No superimposed No midline shift, ventriculomegaly, mass effect, evidence of mass lesion, intracranial hemorrhage or evidence of cortically based acute infarction. Vascular: No suspicious intracranial vascular hyperdensity. Skull: Stable and intact. Sinuses/Orbits: Visualized paranasal sinuses and mastoids are stable and well aerated. Other: Left nasoenteric tube in place. Negative orbit and scalp soft tissues. IMPRESSION: 1. Stable non contrast CT appearance of cerebral white matter and left caudate changes since last month, most compatible with small vessel disease. 2.  No acute intracranial abnormality. Electronically Signed   By: VEAR Hurst M.D.   On: 05/12/2023 07:08   DG Abd Portable 1V Result Date: 05/11/2023 CLINICAL DATA:  Feeding tube placement EXAM: PORTABLE ABDOMEN - 1 VIEW COMPARISON:  05/11/2023 FINDINGS: Interval placement of non weighted enteric feeding tube, tip positioned near the descending duodenum. Nonobstructive pattern of included bowel gas. No acute osseous findings. IMPRESSION: Interval placement of non weighted enteric feeding tube, tip positioned near the descending duodenum. Electronically Signed  By: Marolyn JONETTA Jaksch  M.D.   On: 05/11/2023 12:01   DG Abd 1 View Result Date: 05/11/2023 CLINICAL DATA:  Ileus.  Sudden emesis. EXAM: ABDOMEN - 1 VIEW COMPARISON:  05/09/2023 FINDINGS: Gaseous distention of the stomach may indicate outlet obstruction or dysmotility. An enteric tube is present with tip projecting over the upper stomach. Gas-filled small and large bowel are not abnormally distended, likely ileus. No radiopaque stones. Degenerative changes and postoperative changes in the spine. IMPRESSION: 1. Gaseous distention of the stomach, possibly obstruction or dysmotility. 2. Gas-filled nondistended small and large bowel are likely ileus. 3. Enteric tube tip projects over the upper stomach. Electronically Signed   By: Elsie Gravely M.D.   On: 05/11/2023 01:05    Labs: BMET Recent Labs  Lab 05/09/23 0514 05/09/23 1253 05/09/23 1953 05/10/23 0455 05/10/23 0725 05/10/23 1553 05/11/23 0507 05/11/23 1634 05/12/23 0506  NA 135   < > 135 136 134* 134* 132* 135 134*  K 4.7   < > 4.9 5.2* 5.3* 5.0  4.9 4.8 4.5 3.9  CL 100  --  102 102 98 100 99 101 101  CO2 22  --  19* 19* 21* 22 20* 20* 19*  GLUCOSE 153*  --  145* 182* 186* 219* 210* 140* 226*  BUN 35*  --  30* 29* 28* 30* 31* 32* 31*  CREATININE 4.17*  --  3.23* 2.96* 2.75* 2.54* 2.25* 2.37* 2.46*  CALCIUM  8.0*  --  8.1* 8.3* 8.7* 8.6* 8.5* 8.3* 8.3*  PHOS 4.4  --  3.1 3.9  --  4.0 3.5 2.1* 1.7*   < > = values in this interval not displayed.   CBC Recent Labs  Lab 05/06/23 2226 05/07/23 0059 05/09/23 0515 05/09/23 1253 05/10/23 0455 05/11/23 0918 05/12/23 0506  WBC 12.8*   < > 13.0*  --  7.4 7.9 9.6  NEUTROABS 9.5*  --   --   --   --   --   --   HGB 16.8   < > 14.3 15.3 15.2 15.8 16.0  HCT 57.6*   < > 46.8 45.0 47.6 49.4 51.1  MCV 89.6   < > 85.4  --  83.5 83.2 83.1  PLT 316   < > 237  --  140* 128* 127*   < > = values in this interval not displayed.    Medications:     aspirin   81 mg Per Tube Daily   Chlorhexidine  Gluconate Cloth  6  each Topical Daily   feeding supplement (PROSource TF20)  60 mL Per Tube QID   feeding supplement (VITAL 1.5 CAL)  1,000 mL Per Tube Q24H   heparin  injection (subcutaneous)  5,000 Units Subcutaneous Q8H   insulin  aspart  0-15 Units Subcutaneous Q4H   lidocaine   1 patch Transdermal Q24H   methocarbamol   500 mg Per Tube BID   multivitamin  1 tablet Per Tube QHS   oxyCODONE   10 mg Per Tube Q6H   pantoprazole  (PROTONIX ) IV  40 mg Intravenous Q24H    Gordy Blanch, MD 05/12/2023, 7:49 AM

## 2023-05-12 NOTE — Progress Notes (Signed)
 OT Cancellation Note  Patient Details Name: Dylan Owen MRN: [id number] DOB: 04/18/1965   Cancelled Treatment:    Reason Eval/Treat Not Completed: Fatigue/lethargy limiting ability to participate (Attempted to see pt for OT eval with pt asleep upon OT arrival. Per RN, pt did not sleep well last night with RN requesting OT return at a later time. OT to reattempt to see pt at a later time as appropriate/available.)  Margarie Rockey HERO., OTR/L, MA Acute Rehab [phone]  Margarie FORBES Horns 05/12/2023, 9:38 AM

## 2023-05-12 NOTE — Progress Notes (Signed)
 eLink Physician-Brief Progress Note Patient Name: Dylan Owen DOB: July 07, 1965 MRN: [id number]   Date of Service  05/12/2023  HPI/Events of Note  Bedside RN reporting slight anisocoria and a sluggish left pupil, neurological exam otherwise completely non-focal, interval assessment of pupils per RN shows the left pupil to be less sluggish and the size difference to be diminished, once again no obvious focal neurological findings and patient, who is alert and interactive, reports no headache or blurring of vision, no apparent medication trigger.  eICU Interventions  Bedside RN instructed to carry out a 15 minute interval assessment to document if findings have resolved. If persistent will order  non-contrast head CT to r/o an intracranial event.        [place] Murlean Seelye 05/12/2023, 5:20 AM

## 2023-05-12 NOTE — Evaluation (Signed)
 Occupational Therapy Evaluation Patient Details Name: Dylan Owen MRN: [id number] DOB: 03-08-1966 Today's Date: 05/12/2023   History of Present Illness Pt is 58 yo presenting to [facility] as transfer from [facility] 2/1 due to mixed septic and cardiogenic shock. Pt presented to [facility] hospital due to referral from MD on 05/05/23. Pt started on CRRT on 2/2, Intubated on 2/3 due to constant BiPAP use and unable to wean. Intubation was discontinued on 2/5. PMH: HTN, hyperlipidemia, DM II, sleep apnea, chronic back pain.   Clinical Impression   At baseline, pt is Independent to Mod I with ADLs, IADLs, and functional mobility with a SPC and drives. Pt now presents with decreased activity tolerance, decreased cognition, decreased B UE coordination, and decreased safety and independence with functional tasks. OT eval completed from bed level per RN request due to issues earlier in the day with fecal pouch and due to pt impulsivness and increased confusion this day. From bed level, pt currently demonstrates ability to complete UB ADLs with Set up to Mod assist, LB ADLs with Max to Total assist, and rolling L/R in the bed with Mod to Max assist. Pt required cues throughout session for initiation, sequencing, safety, and compensatory techniques. Pt O2 sat >/94% on 4L continuous O2 through nasal cannula and HR largely in the 80s during session with brief increase up to 123 bpm with bed mobility with HR recovering to the 90s with rest break. Pt participated well in session and is expected to make good progress toward goals. Pt will benefit from acute skilled OT services to address deficits outlined below, decreased caregiver burden, and increase safety and independence with ADLs, functional transfers, and functional mobility. Post acute discharge, pt will benefit from intensive inpatient skilled rehab services > 3 hours per day to maximize rehab potential.       If plan is discharge home, recommend the following: Two people to  help with walking and/or transfers;A lot of help with bathing/dressing/bathroom;Assistance with cooking/housework;Assistance with feeding;Direct supervision/assist for medications management;Direct supervision/assist for financial management;Assist for transportation;Help with stairs or ramp for entrance;Supervision due to cognitive status    Functional Status Assessment  Patient has had a recent decline in their functional status and demonstrates the ability to make significant improvements in function in a reasonable and predictable amount of time.  Equipment Recommendations  Other (comment) (TBD based on pt progress)    Recommendations for Other Services       Precautions / Restrictions Precautions Precautions: Fall Precaution Comments: watch BP and O2, Pt is on CRRT Restrictions Weight Bearing Restrictions Per Provider Order: No      Mobility Bed Mobility Overal bed mobility: Needs Assistance Bed Mobility: Rolling Rolling: Mod assist, Max assist, Used rails         General bed mobility comments: Cues for hand placement/technique, initiation, sequencing, safety, and continued attention to task    Transfers                   General transfer comment: Pt seen from bed level per RN request secondary to issues earlier in the day with fecal pouch and due to pt impulsivness and increased confusion this day. Per chart review, pt completed STS with Min to Mod assist during PT eval on 05/11/23.      Balance  ADL either performed or assessed with clinical judgement   ADL Overall ADL's : Needs assistance/impaired Eating/Feeding: Set up;Bed level Eating/Feeding Details (indicate cue type and reason): for use of cup with straw for sips of water  Grooming: Minimal assistance;Cueing for sequencing;Cueing for compensatory techniques;Bed level (cues for initiation)   Upper Body Bathing: Moderate assistance;Bed  level;Cueing for sequencing;Cueing for safety;Cueing for compensatory techniques (cues for initiation)   Lower Body Bathing: Maximal assistance;Total assistance;Bed level;Cueing for sequencing;Cueing for compensatory techniques;Cueing for safety (cues for initiation)   Upper Body Dressing : Minimal assistance;Bed level;Cueing for sequencing;Cueing for compensatory techniques;Cueing for safety (cues for initiation; with increased time for processing/motor planning)   Lower Body Dressing: Total assistance;Bed level     Toilet Transfer Details (indicate cue type and reason): not attempted this day due to RN request to see pt from bed level this session Toileting- Clothing Manipulation and Hygiene: Total assistance;Bed level         General ADL Comments: Pt with decreased activity tolerance and decreased cognition affecting functional level.     Vision Baseline Vision/History: 0 No visual deficits Ability to See in Adequate Light: 0 Adequate Patient Visual Report: No change from baseline Additional Comments: Vision history per pt report. Pt unable to follow instructions for vision screen this session. OT to further assess during funcitonal tasks during future skilled OT sessions.     Perception         Praxis         Pertinent Vitals/Pain Pain Assessment Pain Assessment: No/denies pain Pain Intervention(s): Monitored during session     Extremity/Trunk Assessment Upper Extremity Assessment Upper Extremity Assessment: Right hand dominant;RUE deficits/detail;LUE deficits/detail RUE Deficits / Details: strength and ROM WFL; decreased proprioception; decreased coordination RUE Sensation: decreased proprioception RUE Coordination: decreased fine motor;decreased gross motor LUE Deficits / Details: strength and ROM WFL; decreased proprioception; decreased coordination LUE Sensation: decreased proprioception LUE Coordination: decreased fine motor;decreased gross motor   Lower  Extremity Assessment Lower Extremity Assessment: Defer to PT evaluation       Communication Communication Communication: Difficulty following commands/understanding;Difficulty communicating thoughts/reduced clarity of speech Cueing Techniques: Verbal cues;Gestural cues;Visual cues   Cognition Arousal: Alert Behavior During Therapy: Flat affect Overall Cognitive Status: Impaired/Different from baseline Area of Impairment: Orientation, Attention, Memory, Following commands, Safety/judgement, Awareness, Problem solving                 Orientation Level: Disoriented to, Time (pt looking to calendar in room to determine month and then stating 2 instead of February and unable to report the year) Current Attention Level: Focused Memory: Decreased recall of precautions, Decreased short-term memory Following Commands: Follows one step commands inconsistently, Follows one step commands with increased time Safety/Judgement: Decreased awareness of safety, Decreased awareness of deficits Awareness: Intellectual Problem Solving: Slow processing, Decreased initiation, Difficulty sequencing, Requires verbal cues (Requires visual cues) General Comments: Per RN, pt with noted confusion, decreased cognition, and increased impulsiveness today with pt pulling out cortrack earlier and frequently removing O2 nasal cannula.     General Comments  Pt found to have had removed O2 nasal cannula from nose upon OT arrival with O2 sat at 93% on RA. Nasal cannula left off during inital part of assessment with pt O2 largely stable at 90% but with desat to 88% with B UE AROM. Nasal cannula placed back on pt with pt O2 sat >/94% on 4L continuous O2 through nasal cannula throughout remainder of session. Pt HR largely in the 80s during session with brief  increase up to 123 bpm with bed mobility with HR recovering to the 90s with rest break in supine with HOB elevated. RN present during a portion of session.     Exercises     Shoulder Instructions      Home Living Family/patient expects to be discharged to:: Private residence Living Arrangements: Spouse/significant other;Children (adult children ages 48, 57, and 39) Available Help at Discharge: Family;Available 24 hours/day Type of Home: House Home Access: Ramped entrance     Home Layout: One level     Bathroom Shower/Tub: Producer, Television/film/video: Standard Bathroom Accessibility: Yes   Home Equipment: Agricultural Consultant (2 wheels);Shower seat - built in;Cane - single point   Additional Comments: Home living and PLOF largely per chart review as pt able to provide only limited informaiton this session.      Prior Functioning/Environment Prior Level of Function : Independent/Modified Independent;Driving             Mobility Comments: Mod I, used cane for mobility ADLs Comments: Independent to Mod I for ADLs and IADLs. Enjoys woodworking/furniture making.        OT Problem List: Decreased activity tolerance;Decreased coordination;Decreased cognition;Decreased safety awareness;Decreased knowledge of use of DME or AE;Cardiopulmonary status limiting activity      OT Treatment/Interventions: Self-care/ADL training;DME and/or AE instruction;Therapeutic activities;Cognitive remediation/compensation;Patient/family education;Balance training;Therapeutic exercise    OT Goals(Current goals can be found in the care plan section) Acute Rehab OT Goals Patient Stated Goal: pt unable to state this day OT Goal Formulation: Patient unable to participate in goal setting Time For Goal Achievement: 05/26/23 Potential to Achieve Goals: Good ADL Goals Pt Will Perform Grooming: with supervision;sitting (sitting EOB for 5 or more minutes with Good balance) Pt Will Perform Upper Body Bathing: with contact guard assist;sitting (with use of adaptive equipment as needed) Pt Will Perform Lower Body Bathing: sitting/lateral leans;sit to/from  stand;with min assist (with use of adaptive equipment as needed) Pt Will Perform Lower Body Dressing: with min assist;sit to/from stand;sitting/lateral leans (with use of adaptive equipment as needed) Pt Will Transfer to Toilet: with supervision;ambulating;bedside commode (with least restrictive AD) Additional ADL Goal #1: Patient will demonstrate ability to Independently appropriately sequence a 3-step therapeutic or functional task without cues to increase safety and independence with functional tasks.  OT Frequency: Min 1X/week    Co-evaluation              AM-PAC OT 6 Clicks Daily Activity     Outcome Measure Help from another person eating meals?: A Little Help from another person taking care of personal grooming?: A Little Help from another person toileting, which includes using toliet, bedpan, or urinal?: Total Help from another person bathing (including washing, rinsing, drying)?: A Lot Help from another person to put on and taking off regular upper body clothing?: A Little Help from another person to put on and taking off regular lower body clothing?: Total 6 Click Score: 13   End of Session Equipment Utilized During Treatment: Oxygen Nurse Communication: Mobility status;Other (comment) (Pt with increased confusion and impulsivness and decreased cognition this day. Pt with issues with retal pouch earlier this day.)  Activity Tolerance: Patient tolerated treatment well;Treatment limited secondary to medical complications (Comment) (Pt l;imited by decreased cognition) Patient left: in bed;with call bell/phone within reach  OT Visit Diagnosis: Ataxia, unspecified (R27.0);Other symptoms and signs involving cognitive function;Other (comment) (decreased activity tolerance)                Time:  8574-8554 OT Time Calculation (min): 20 min Charges:  OT General Charges $OT Visit: 1 Visit OT Evaluation $OT Eval Moderate Complexity: 1 Mod  Margarie Rockey HERO., OTR/L, MA Acute  Rehab [phone]  Margarie FORBES Horns 05/12/2023, 6:37 PM

## 2023-05-13 ENCOUNTER — Inpatient Hospital Stay (HOSPITAL_COMMUNITY): Payer: PPO

## 2023-05-13 ENCOUNTER — Encounter (HOSPITAL_COMMUNITY): Payer: Self-pay | Admitting: Cardiology

## 2023-05-13 ENCOUNTER — Encounter (HOSPITAL_COMMUNITY)
Admission: EM | Disposition: A | Discharge status: Home Health | Payer: Self-pay | Source: Home / Self Care | Attending: Internal Medicine

## 2023-05-13 DIAGNOSIS — I5021 Acute systolic (congestive) heart failure: Secondary | ICD-10-CM | POA: Diagnosis not present

## 2023-05-13 DIAGNOSIS — R6521 Severe sepsis with septic shock: Secondary | ICD-10-CM | POA: Diagnosis not present

## 2023-05-13 DIAGNOSIS — J189 Pneumonia, unspecified organism: Secondary | ICD-10-CM

## 2023-05-13 DIAGNOSIS — I4891 Unspecified atrial fibrillation: Secondary | ICD-10-CM

## 2023-05-13 DIAGNOSIS — J9601 Acute respiratory failure with hypoxia: Secondary | ICD-10-CM | POA: Diagnosis not present

## 2023-05-13 DIAGNOSIS — I483 Typical atrial flutter: Secondary | ICD-10-CM | POA: Diagnosis not present

## 2023-05-13 DIAGNOSIS — A419 Sepsis, unspecified organism: Secondary | ICD-10-CM | POA: Diagnosis not present

## 2023-05-13 DIAGNOSIS — G9341 Metabolic encephalopathy: Secondary | ICD-10-CM | POA: Diagnosis not present

## 2023-05-13 HISTORY — PX: RIGHT HEART CATH: CATH118263

## 2023-05-13 LAB — APTT
aPTT: 89 s — ABNORMAL HIGH (ref 24–36)
aPTT: 94 s — ABNORMAL HIGH (ref 24–36)

## 2023-05-13 LAB — RENAL FUNCTION PANEL
Albumin: 2.6 g/dL — ABNORMAL LOW (ref 3.5–5.0)
Albumin: 2.1 g/dL — ABNORMAL LOW (ref 3.5–5.0)
Anion gap: 13 (ref 5–15)
Anion gap: 18 — ABNORMAL HIGH (ref 5–15)
BUN: 30 mg/dL — ABNORMAL HIGH (ref 6–20)
BUN: 30 mg/dL — ABNORMAL HIGH (ref 6–20)
CO2: 20 mmol/L — ABNORMAL LOW (ref 22–32)
CO2: 16 mmol/L — ABNORMAL LOW (ref 22–32)
Calcium: 8.3 mg/dL — ABNORMAL LOW (ref 8.9–10.3)
Calcium: 7.2 mg/dL — ABNORMAL LOW (ref 8.9–10.3)
Chloride: 101 mmol/L (ref 98–111)
Chloride: 99 mmol/L (ref 98–111)
Creatinine, Ser: 2.34 mg/dL — ABNORMAL HIGH (ref 0.61–1.24)
Creatinine, Ser: 2.53 mg/dL — ABNORMAL HIGH (ref 0.61–1.24)
GFR, Estimated: 32 mL/min — ABNORMAL LOW (ref >60)
GFR, Estimated: 29 mL/min — ABNORMAL LOW (ref >60)
Glucose, Bld: 173 mg/dL — ABNORMAL HIGH (ref 70–99)
Glucose, Bld: 359 mg/dL — ABNORMAL HIGH (ref 70–99)
Phosphorus: 1.6 mg/dL — ABNORMAL LOW (ref 2.5–4.6)
Phosphorus: 4.8 mg/dL — ABNORMAL HIGH (ref 2.5–4.6)
Potassium: 4.2 mmol/L (ref 3.5–5.1)
Potassium: 3.8 mmol/L (ref 3.5–5.1)
Sodium: 134 mmol/L — ABNORMAL LOW (ref 135–145)
Sodium: 133 mmol/L — ABNORMAL LOW (ref 135–145)

## 2023-05-13 LAB — POCT I-STAT EG7
Acid-base deficit: 1 mmol/L (ref 0.0–2.0)
Acid-base deficit: 2 mmol/L (ref 0.0–2.0)
Bicarbonate: 21.9 mmol/L (ref 20.0–28.0)
Bicarbonate: 22.5 mmol/L (ref 20.0–28.0)
Calcium, Ion: 1.07 mmol/L — ABNORMAL LOW (ref 1.15–1.40)
Calcium, Ion: 1.09 mmol/L — ABNORMAL LOW (ref 1.15–1.40)
HCT: 55 % — ABNORMAL HIGH (ref 39.0–52.0)
HCT: 55 % — ABNORMAL HIGH (ref 39.0–52.0)
Hemoglobin: 18.7 g/dL — ABNORMAL HIGH (ref 13.0–17.0)
Hemoglobin: 18.7 g/dL — ABNORMAL HIGH (ref 13.0–17.0)
O2 Saturation: 73 %
O2 Saturation: 75 %
Potassium: 4.2 mmol/L (ref 3.5–5.1)
Potassium: 4.2 mmol/L (ref 3.5–5.1)
Sodium: 139 mmol/L (ref 135–145)
Sodium: 139 mmol/L (ref 135–145)
TCO2: 23 mmol/L (ref 22–32)
TCO2: 24 mmol/L (ref 22–32)
pCO2, Ven: 34.6 mm[Hg] — ABNORMAL LOW (ref 44–60)
pCO2, Ven: 35 mm[Hg] — ABNORMAL LOW (ref 44–60)
pH, Ven: 7.405 (ref 7.25–7.43)
pH, Ven: 7.421 (ref 7.25–7.43)
pO2, Ven: 38 mm[Hg] (ref 32–45)
pO2, Ven: 39 mm[Hg] (ref 32–45)

## 2023-05-13 LAB — CULTURE, RESPIRATORY W GRAM STAIN

## 2023-05-13 LAB — CBC
HCT: 52.2 % — ABNORMAL HIGH (ref 39.0–52.0)
Hemoglobin: 16.8 g/dL (ref 13.0–17.0)
MCH: 26.3 pg (ref 26.0–34.0)
MCHC: 32.2 g/dL (ref 30.0–36.0)
MCV: 81.8 fL (ref 80.0–100.0)
Platelets: 99 10*3/uL — ABNORMAL LOW (ref 150–400)
RBC: 6.38 MIL/uL — ABNORMAL HIGH (ref 4.22–5.81)
RDW: 18.4 % — ABNORMAL HIGH (ref 11.5–15.5)
WBC: 12.3 10*3/uL — ABNORMAL HIGH (ref 4.0–10.5)
nRBC: 0 % (ref 0.0–0.2)

## 2023-05-13 LAB — HEPATIC FUNCTION PANEL
ALT: 206 U/L — ABNORMAL HIGH (ref 0–44)
AST: 113 U/L — ABNORMAL HIGH (ref 15–41)
Albumin: 2.6 g/dL — ABNORMAL LOW (ref 3.5–5.0)
Alkaline Phosphatase: 105 U/L (ref 38–126)
Bilirubin, Direct: 1.3 mg/dL — ABNORMAL HIGH (ref 0.0–0.2)
Indirect Bilirubin: 1.5 mg/dL — ABNORMAL HIGH (ref 0.3–0.9)
Total Bilirubin: 2.8 mg/dL — ABNORMAL HIGH (ref 0.0–1.2)
Total Protein: 7.1 g/dL (ref 6.5–8.1)

## 2023-05-13 LAB — GLUCOSE, CAPILLARY
Glucose-Capillary: 171 mg/dL — ABNORMAL HIGH (ref 70–99)
Glucose-Capillary: 174 mg/dL — ABNORMAL HIGH (ref 70–99)
Glucose-Capillary: 176 mg/dL — ABNORMAL HIGH (ref 70–99)
Glucose-Capillary: 178 mg/dL — ABNORMAL HIGH (ref 70–99)
Glucose-Capillary: 194 mg/dL — ABNORMAL HIGH (ref 70–99)
Glucose-Capillary: 204 mg/dL — ABNORMAL HIGH (ref 70–99)

## 2023-05-13 LAB — COOXEMETRY PANEL
Carboxyhemoglobin: 1.5 % (ref 0.5–1.5)
Methemoglobin: 0.8 % (ref 0.0–1.5)
O2 Saturation: 66.4 %
Total hemoglobin: 17.1 g/dL — ABNORMAL HIGH (ref 12.0–16.0)

## 2023-05-13 LAB — MAGNESIUM: Magnesium: 2.6 mg/dL — ABNORMAL HIGH (ref 1.7–2.4)

## 2023-05-13 LAB — AMMONIA: Ammonia: 41 umol/L — ABNORMAL HIGH (ref 9–35)

## 2023-05-13 LAB — PHOSPHORUS: Phosphorus: 5.2 mg/dL — ABNORMAL HIGH (ref 2.5–4.6)

## 2023-05-13 SURGERY — RIGHT HEART CATH
Anesthesia: LOCAL

## 2023-05-13 MED ORDER — PROPOFOL 10 MG/ML IV BOLUS
INTRAVENOUS | Status: AC
  Filled 2023-05-13: qty 20

## 2023-05-13 MED ORDER — MIDAZOLAM HCL 2 MG/2ML IJ SOLN
INTRAMUSCULAR | Status: AC
  Filled 2023-05-13: qty 2

## 2023-05-13 MED ORDER — PANTOPRAZOLE SODIUM 40 MG PO TBEC: 40.0000 mg | DELAYED_RELEASE_TABLET | Freq: Every day | ORAL | Status: DC

## 2023-05-13 MED ORDER — FENTANYL CITRATE PF 50 MCG/ML IJ SOSY
PREFILLED_SYRINGE | INTRAMUSCULAR | Status: AC
  Filled 2023-05-13: qty 1

## 2023-05-13 MED ORDER — K PHOS MONO-SOD PHOS DI & MONO 155-852-130 MG PO TABS
500.0000 mg | ORAL_TABLET | ORAL | Status: DC
  Filled 2023-05-13 (×2): qty 2

## 2023-05-13 MED ORDER — FENTANYL CITRATE (PF) 100 MCG/2ML IJ SOLN
INTRAMUSCULAR | Status: DC | PRN
  Administered 2023-05-13: 25 ug via INTRAVENOUS

## 2023-05-13 MED ORDER — HEPARIN SODIUM (PORCINE) 1000 UNIT/ML IJ SOLN
3000.0000 [IU] | Freq: Once | INTRAMUSCULAR | Status: AC
  Administered 2023-05-13: 2800 [IU] via INTRAVENOUS

## 2023-05-13 MED ORDER — SODIUM CHLORIDE 0.9 % IV SOLN: INTRAVENOUS | Status: DC

## 2023-05-13 MED ORDER — MIDAZOLAM HCL 2 MG/2ML IJ SOLN
INTRAMUSCULAR | Status: DC | PRN
  Administered 2023-05-13: 1 mg via INTRAVENOUS

## 2023-05-13 MED ORDER — MILRINONE LACTATE IN DEXTROSE 20-5 MG/100ML-% IV SOLN: 0.1250 ug/kg/min | INTRAVENOUS | Status: DC

## 2023-05-13 MED ORDER — PROPOFOL 10 MG/ML IV BOLUS
100.0000 mg | Freq: Once | INTRAVENOUS | Status: AC
  Administered 2023-05-13: 100 mg via INTRAVENOUS

## 2023-05-13 MED ORDER — LIDOCAINE HCL (PF) 1 % IJ SOLN
INTRAMUSCULAR | Status: DC | PRN
  Administered 2023-05-13: 5 mL

## 2023-05-13 MED ORDER — HEPARIN (PORCINE) IN NACL 1000-0.9 UT/500ML-% IV SOLN
INTRAVENOUS | Status: DC | PRN
  Administered 2023-05-13: 500 mL

## 2023-05-13 MED ORDER — NOREPINEPHRINE 4 MG/250ML-% IV SOLN
0.0000 ug/min | INTRAVENOUS | Status: DC
  Administered 2023-05-13: 5 ug/min via INTRAVENOUS

## 2023-05-13 MED ORDER — HEPARIN SODIUM (PORCINE) 1000 UNIT/ML IJ SOLN
INTRAMUSCULAR | Status: AC
  Filled 2023-05-13: qty 3

## 2023-05-13 MED ORDER — NOREPINEPHRINE 4 MG/250ML-% IV SOLN
INTRAVENOUS | Status: AC
  Filled 2023-05-13: qty 250

## 2023-05-13 MED ORDER — SODIUM CHLORIDE 0.9 % IV SOLN: INTRAVENOUS | Status: AC | PRN

## 2023-05-13 MED ORDER — PANTOPRAZOLE SODIUM 40 MG IV SOLR
40.0000 mg | Freq: Every day | INTRAVENOUS | Status: DC
Start: 2023-05-13 — End: 2023-05-17
  Administered 2023-05-13 – 2023-05-16 (×4): 40 mg via INTRAVENOUS
  Filled 2023-05-13 (×4): qty 10

## 2023-05-13 MED ORDER — SODIUM CHLORIDE 0.9 % IV SOLN
0.0450 mg/kg/h | INTRAVENOUS | Status: DC
  Administered 2023-05-13: 0.1 mg/kg/h via INTRAVENOUS
  Administered 2023-05-14: 0.05 mg/kg/h via INTRAVENOUS
  Administered 2023-05-16 – 2023-05-18 (×2): 0.045 mg/kg/h via INTRAVENOUS
  Filled 2023-05-13 (×4): qty 250

## 2023-05-13 MED ORDER — SODIUM PHOSPHATES 45 MMOLE/15ML IV SOLN
45.0000 mmol | Freq: Once | INTRAVENOUS | Status: AC
  Administered 2023-05-13: 45 mmol via INTRAVENOUS
  Filled 2023-05-13: qty 15

## 2023-05-13 MED ORDER — HEPARIN SOD (PORK) LOCK FLUSH 100 UNIT/ML IV SOLN
500.0000 [IU] | Freq: Once | INTRAVENOUS | Status: DC
  Filled 2023-05-13: qty 5

## 2023-05-13 MED ORDER — AMIODARONE LOAD VIA INFUSION
150.0000 mg | Freq: Once | INTRAVENOUS | Status: AC
  Administered 2023-05-13: 150 mg via INTRAVENOUS

## 2023-05-13 MED ORDER — FENTANYL CITRATE (PF) 100 MCG/2ML IJ SOLN
INTRAMUSCULAR | Status: AC
  Filled 2023-05-13: qty 2

## 2023-05-13 MED ORDER — ORAL CARE MOUTH RINSE
15.0000 mL | OROMUCOSAL | Status: DC
  Administered 2023-05-13 – 2023-05-15 (×8): 15 mL via OROMUCOSAL

## 2023-05-13 MED ORDER — FENTANYL CITRATE PF 50 MCG/ML IJ SOSY
50.0000 ug | PREFILLED_SYRINGE | Freq: Once | INTRAMUSCULAR | Status: AC
  Administered 2023-05-13: 50 ug via INTRAVENOUS

## 2023-05-13 MED ORDER — LIDOCAINE HCL (PF) 1 % IJ SOLN
INTRAMUSCULAR | Status: AC
  Filled 2023-05-13: qty 30

## 2023-05-13 MED ORDER — ORAL CARE MOUTH RINSE: 15.0000 mL | OROMUCOSAL | Status: DC | PRN

## 2023-05-13 SURGICAL SUPPLY — 7 items
CATH SWAN GANZ 7F STRAIGHT (CATHETERS) IMPLANT
PACK CARDIAC CATHETERIZATION (CUSTOM PROCEDURE TRAY) ×2 IMPLANT
SHEATH PINNACLE 7F 10CM (SHEATH) IMPLANT
SHEATH PROBE COVER 6X72 (BAG) IMPLANT
TRANSDUCER W/STOPCOCK (MISCELLANEOUS) IMPLANT
TUBING ART PRESS 72 MALE/FEM (TUBING) IMPLANT
WIRE MICRO SET SILHO 5FR 7 (SHEATH) IMPLANT

## 2023-05-13 NOTE — H&P (View-Only) (Signed)
 Advanced Heart Failure Rounding Note  HF Cardiologist: Dr. Zenaida   Chief Complaint: Mixed Cardiogenic and Septic Shock    Patient Profile   58 y.o. male with a PMH of obesity, HTN, HLD, DM2, and OSA admitted w/ mixed septic and cardiogenic shock>>progression to anuric renal failure requiring CRRT.  Interval Hx:    2/3 worsening septic shock>> fever up to 101, increase LA, escalation of pressors, delirium and worsening respiratory failure>>intubated. Milrinone  stopped due to frequent ectopy. Abx broadened. Vanc added to meropenum. CT of C/A/P w/ multifocal PNA and s/o cystitis. 2/4 Marked improvement. Resolution of fever and delirium. Weaned off pressors, became hypertensive and started on cleviprex  gtt  2/5 Extubated, weaned off Cleviprex , Cortrak placed (post pyloric)   Subjective:    Put on BiPAP around 5:30 am for hypoxia and AMS. Did not wear overnight.   Patient pulled out his CorTrak.  Opens eyes to voice. Following commands intermittently.   Negative 3L last 24 hrs, remains on CRRT.      Objective:   Weight Range: 127 kg Body mass index is 36.94 kg/m.   Vital Signs:   Temp:  [98 F (36.7 C)-99 F (37.2 C)] 99 F (37.2 C) (02/07 0400) Pulse Rate:  [50-173] 79 (02/07 0630) Resp:  [13-36] 26 (02/07 0630) BP: (84-153)/(45-134) 112/50 (02/07 0630) SpO2:  [80 %-99 %] 97 % (02/07 0630) Arterial Line BP: (103-171)/(45-79) 130/50 (02/06 1753) FiO2 (%):  [60 %] 60 % (02/07 0518) Weight:  [872 kg] 127 kg (02/07 0500) Last BM Date : 05/12/23  Weight change: Filed Weights   05/11/23 0448 05/12/23 0500 05/13/23 0500  Weight: 129.5 kg 124.8 kg 127 kg    Intake/Output:   Intake/Output Summary (Last 24 hours) at 05/13/2023 9287 Last data filed at 05/13/2023 0700 Gross per 24 hour  Intake 1945.77 ml  Output 5002 ml  Net -3056.23 ml      Physical Exam    CVP waveform flat General:  Drowsy. On BiPAP. Neck: L internal jugular HD cath, RUE PICC Cor:  Irregular rhythm, tachy. No rubs, gallops or murmurs. Lungs: clear Abdomen: obese, soft, nondistended.  Extremities: no cyanosis, clubbing, rash, 1+ edema Neuro: Opens eyes to voice and makes eye contact  Labs    CBC Recent Labs    05/12/23 0506 05/12/23 1544 05/13/23 0414  WBC 9.6  --  12.3*  HGB 16.0 17.7* 16.8  HCT 51.1 52.0 52.2*  MCV 83.1  --  81.8  PLT 127*  --  99*   Basic Metabolic Panel Recent Labs    97/93/74 0506 05/12/23 1544 05/12/23 1628 05/13/23 0414  NA 134*   < > 133* 134*  K 3.9   < > 4.3 4.2  CL 101  --  100 101  CO2 19*  --  19* 20*  GLUCOSE 226*  --  270* 173*  BUN 31*  --  38* 30*  CREATININE 2.46*  --  2.49* 2.34*  CALCIUM  8.3*  --  8.2* 8.3*  MG 2.5*  --   --  2.6*  PHOS 1.7*  --  1.6* 1.6*   < > = values in this interval not displayed.   Liver Function Tests Recent Labs    05/12/23 0506 05/12/23 1628 05/13/23 0414  AST 261*  --  113*  ALT 307*  --  206*  ALKPHOS 116  --  105  BILITOT 3.3*  --  2.8*  PROT 7.0  --  7.1  ALBUMIN  2.5*  2.6*  2.5* 2.6*  2.6*   No results for input(s): LIPASE, AMYLASE in the last 72 hours.  Cardiac Enzymes No results for input(s): CKTOTAL, CKMB, CKMBINDEX, TROPONINI in the last 72 hours.   BNP: BNP (last 3 results) Recent Labs    05/04/23 1645  BNP 1,275.2*    ProBNP (last 3 results) No results for input(s): PROBNP in the last 8760 hours.   D-Dimer No results for input(s): DDIMER in the last 72 hours. Hemoglobin A1C No results for input(s): HGBA1C in the last 72 hours. Fasting Lipid Panel Recent Labs    05/12/23 0506  TRIG 200*    Thyroid  Function Tests No results for input(s): TSH, T4TOTAL, T3FREE, THYROIDAB in the last 72 hours.  Invalid input(s): FREET3  Other results:    Medications:     Scheduled Medications:  aspirin  EC  81 mg Oral Daily   bacitracin    Topical BID   Chlorhexidine  Gluconate Cloth  6 each Topical Daily   feeding  supplement  237 mL Oral TID BM   heparin  injection (subcutaneous)  5,000 Units Subcutaneous Q8H   hydrALAZINE   25 mg Oral Q8H   insulin  aspart  0-15 Units Subcutaneous Q4H   lidocaine   1 patch Transdermal Q24H   methocarbamol   500 mg Oral BID   multivitamin  1 tablet Oral QHS   mouth rinse  15 mL Mouth Rinse 4 times per day   oxyCODONE   10 mg Oral Q6H   pantoprazole  (PROTONIX ) IV  40 mg Intravenous Q24H    Infusions:   prismasol  BGK 4/2.5 400 mL/hr at 05/12/23 2300    prismasol  BGK 4/2.5 400 mL/hr at 05/12/23 2130   amiodarone  30 mg/hr (05/13/23 0700)   meropenem  (MERREM ) IV Stopped (05/12/23 2217)   prismasol  BGK 4/2.5 1,500 mL/hr at 05/12/23 1845    PRN Medications: acetaminophen  (TYLENOL ) oral liquid 160 mg/5 mL, albuterol , heparin , HYDROmorphone  (DILAUDID ) injection, naLOXone  (NARCAN )  injection, nitroGLYCERIN , ondansetron  (ZOFRAN ) IV, mouth rinse, mouth rinse, sodium chloride     Assessment/Plan   Mixed cardiogenic/septic shock: Presented febrile, cold on exam, with worsening blood pressure. TEE w/ moderately reduced LVEF. Initially on Milrinone . 2/3 developed worsening septic shock w/ fever, increase in lactic acid, delirium, and worsening respiratory failure>>intubated. Milrinone  stopped due to frequent ectopy. Vanc added to meropenum. CT of C/A/P w/ multifocal PNA and s/o cystitis. UA dirty, Ucx multiple species, Respiratory Cx no growth, Bcx NGTD - Sepsis resolving. Suspect source PNA/UTI.  Now off Vanc. Remains on merrem  through tomorrow Abx per CCM  - Cardiogenic shock resolved. CO-OX 66%.  Limited echo 02/06 with EF 40-45%, RWMA, RV reduced - CRP/ESR only mildly elevated, doubt myocarditis as initially suspected. Suspect likely stress induced CM in setting of sepsis.   Acute systolic CHF - Suspect stress CM in setting of sepsis - Echo 01/30: Unable to estimate EF d/t image quality, RWMA - Limited echo 02/06: EF 40-45%, RWMA, RV reduced - Volume managed with CRRT.  Discussed with Nephrology at bedside. Planning to stop CRRT once filter expires or clots. - CVP waveform flat. Going for RHC today. Suspect RV dysfunction and pulmonary hypertension.  - BP soft. Stop hydralazine   Acute hypoxic and hypercarbic respiratory failure Hx OSA - Secondary to PNA and acute heart failure. Intubated 2/3 - extubated 2/5 - On BiPAP this morning, did not wear last night. Needs to wear BiPAP nightly.  - continue abx per CCM  - Remains on CRRT for volume removal  Atrial flutter with RVR - Noted yesterday am.  -  Loading with IV amiodarone , continue at 30 mg/hr - Plan DCCV today. Will ask CCM to assist with sedation  Thrombocytopenia - Platelets 237 on 02/03>>99K today - Started heparin  02/03. Switch to bival. Check HIT panel  Hypertension  - BP soft this am - Hold hydralazine  for now  AKI: suspect ATN 2/2 mixed shock. Remains anuric.  - continue CRRT until filter expires or clots.  - Nephrology following   Shock liver: - 2/2 mixed shock/hepatic congestion from CHF  - LFTs trending down  Diabetes:  poorly controlled,  A1c 10.2   - insulin  management per CCM   Acute metabolic encephalopathy - CTH 97/94 stable. Obtained for anisocoria - Suspect triggers were sepsis, uremia and acidosis - Ammonia mildly elevated - Needs to war BiPAP at night  Deconditioning - appears extremely weak - Aggressive PT/OT   Called wife and left message to obtain consent for RHC and DCCV today.  CRITICAL CARE Performed by: COLLETTA SHAVER N   Total critical care time: 40 minutes  Critical care time was exclusive of separately billable procedures and treating other patients.  Critical care was necessary to treat or prevent imminent or life-threatening deterioration.  Critical care was time spent personally by me on the following activities: development of treatment plan with patient and/or surrogate as well as nursing, discussions with consultants, evaluation of  patient's response to treatment, examination of patient, obtaining history from patient or surrogate, ordering and performing treatments and interventions, ordering and review of laboratory studies, ordering and review of radiographic studies, pulse oximetry and re-evaluation of patient's condition.   Length of Stay: 8  Wyolene Weimann N, PA-C  05/13/2023, 7:12 AM  Advanced Heart Failure Team Pager [phone] (M-F; 7a - 5p)  Please contact [facility] for night-coverage after hours (5p -7a ) and weekends on [email or web address]

## 2023-05-13 NOTE — Progress Notes (Signed)
 RT @ bedside for cardioversion on pt. Pt tolerated procedure well with no complications.

## 2023-05-13 NOTE — Progress Notes (Addendum)
 PHARMACY - ANTICOAGULATION CONSULT NOTE  Pharmacy Consult for bivalirudin  Indication: Atrial flutter  Allergies  Allergen Reactions   Ace Inhibitors     Kidney failure   Atorvastatin  Other (See Comments)    Arm pain   Prednisone     REACTION: Anxiety   Septra  [Sulfamethoxazole -Trimethoprim ]    Metformin  And Related Diarrhea    Patient Measurements: Height: 6' 1 (185.4 cm) Weight: 127 kg (279 lb 15.8 oz) IBW/kg (Calculated) : 79.9  Vital Signs: Temp: 98.4 F (36.9 C) (02/07 2000) Temp Source: Axillary (02/07 2000) BP: 108/61 (02/07 2000) Pulse Rate: 90 (02/07 2000)  Labs: Recent Labs    05/11/23 0918 05/11/23 1634 05/12/23 0506 05/12/23 1544 05/12/23 1628 05/13/23 0414 05/13/23 1512 05/13/23 1643 05/13/23 1952  HGB 15.8  --  16.0 17.7*  --  16.8 18.7*  18.7*  --   --   HCT 49.4  --  51.1 52.0  --  52.2* 55.0*  55.0*  --   --   PLT 128*  --  127*  --   --  99*  --   --   --   APTT  --   --   --   --   --   --   --   --  89*  LABPROT  --   --  14.1  --   --   --   --   --   --   INR  --   --  1.1  --   --   --   --   --   --   CREATININE  --    < > 2.46*  --  2.49* 2.34*  --  2.53*  --    < > = values in this interval not displayed.    Estimated Creatinine Clearance: 45 mL/min (A) (by C-G formula based on SCr of 2.53 mg/dL (H)).  Assessment: 58 yo male with new AFL 2/6, not on anticoagulation PTA.  Pharmacy consulted for bivalrudin dosing with concern for HIT - was on prophylactic SQ heparin  since 2/2 while on CRRT with baseline pltc 237, down to 99 today, 4T score ~3 (low probability).  CRRT now stopped post RHC.  S/p DCCV on 2/7.  aPTT slightly supra-therapeutic at 89 sec.  Lab was drawn from the HD cath that is heparin  locked, so the result might be contaminated.  No bleeding reported.  Goal of Therapy:  aPTT 50-85 seconds Monitor platelets by anticoagulation protocol: Yes   Plan:  Continue bivalirudin  at 0.1 mg/kg/hr - stat repeat aPTT  Xylia Scherger D.  Lendell, PharmD, BCPS, BCCCP 05/13/2023, 9:01 PM  ============================  Addendum: Confirmed aPTT higher at 94 sec Reduce bivalirudin  to 0.08 mg/kg/hr Check 4 hr aPTT  Mylan Schwarz D. Lendell, PharmD, BCPS, BCCCP 05/13/2023, 10:21 PM

## 2023-05-13 NOTE — Progress Notes (Signed)
 NAME:  Dylan Owen, MRN:  [id number], DOB:  [birth year], LOS: 8 ADMISSION DATE:  05/04/2023, CONSULTATION DATE:  05/07/23 REFERRING MD:  Nilda, CHIEF COMPLAINT:  resp failure shock    History of Present Illness:  57 year old male with significant obesity, hypertension, hyperlipidemia, type 2 diabetes, sleep apnea, chronic back pain, depression, acid reflux.  Also depression significant home medications include Lasix , hydrochlorothiazide .  Lisinopril , metoprolol , nitroglycerin  but also Robaxin  and MS Contin  30 mg twice daily along with Repatha .  And Effexor .  Along with insulin  for diabetes.  She he walks around with a cane at baseline.  He also admits to medication noncompliance.  And 10 pound weight gain he is not on oxygen at home.  His hemoglobin A1c was 10.2 at admission consistent with very poor compliance.   Admitted on 05/04/2023 with complaints of cough, sore throat, dizziness and bodyaches for 1 week.  Also included chest tightness headaches chills diaphoresis.  At the time of arrival to the ED also had dyspnea on exertion.  At admission BNP was 1275.  Respiratory virus panel was negative.  He was started on Lasix  60 twice daily and his home lisinopril .  He was initially hypertensive and with this management blood pressure improved.  Echocardiogram showed low ejection fraction approximately 25% although poor acoustic windows.  Chest x-ray suggestive of venous congestion.  Cardiology consultation 05/06/2023 noted net -1.6 L since admission and patient was given history of increased frequency of urination.  Diagnose of viral myocarditis was made and left a right heart catheterization was considered.  Troponin barely elevated and consistent with viral myocarditis.   Postadmission with diuresis as of 05/06/2023 creatinine rose up to 1.57 mg percent [baseline 1 mg percent].  At this point lisinopril  was held.  811/31/25 developed hypoglycemia requiring infusion of dextrose  and transferred to the  intensive care unit.  Post transfer to the intensive care unit he developed acute fever and also BiPAP dependency.  Along with worsening renal failure further to creatinine of 2.83 mg percent along with persistent lactic acidosis around 3.  And mild intermittent delirium.  Overnight diaphoresis reported but despite all this nursing consistently reports that his periphery is extremely cold.   Cardiology has been reconsulted.  Critical care medicine consulted.  Pertinent  Medical History  B12 deficiency, Blood transfusion without reported diagnosis, Chronic low back pain, Depression, Diabetes mellitus with complication (HCC), GERD (gastroesophageal reflux disease), Heart murmur, History of adenomatous polyp of colon (05/22/2021), Hypercholesterolemia, Hypertension, OSA (obstructive sleep apnea), Peripheral neuropathy, Persistent asthma, Seizures (HCC), Stable angina (HCC), Statin intolerance, and Subclinical hypothyroidism (06/2017).    reports that he has never smoked. His smokeless tobacco use includes chew.  Significant Hospital Events: Including procedures, antibiotic start and stop dates in addition to other pertinent events   1/29 admit to [facility], diuresis 1/30 ECHO w LVEF 25% 1/31 cards consult, c/f viral myocarditis  2/1 PCCM consult txf to [facility], Adv HF consult  2/2 for HD cath placement and CRRT. Incr D10 for persistent hypogly   2/3 milrinone  stopped. Weaning NE; intubated due to being on bipap for a few days and needing to go to CT  2/5 extubated. Cleviprex  for hypertension   2/6 CT H for anisocoria, no acute findings. Off clevi. Adding PRN metop. CRRT UF decr to 150/hr.  2/7 on BiPAP   Interim History / Subjective:  Didn't wear BiPAP overnight Put on around 530  More altered reportedly this morning   Coox 66  Plt to 99  Objective  Blood pressure 93/70, pulse 91, temperature 97.7 F (36.5 C), temperature source Axillary, resp. rate (!) 29, height 6' 1 (1.854 m), weight 127 kg,  SpO2 (!) 89%. CVP:  [0 mmHg-11 mmHg] 5 mmHg  Vent Mode: BIPAP;PCV FiO2 (%):  [60 %] 60 % Set Rate:  [14 bmp] 14 bmp PEEP:  [5 cmH20] 5 cmH20   Intake/Output Summary (Last 24 hours) at 05/13/2023 1042 Last data filed at 05/13/2023 1000 Gross per 24 hour  Intake 1986.21 ml  Output 5336 ml  Net -3349.79 ml   Filed Weights   05/12/23 0500 05/13/23 0500 05/13/23 1030  Weight: 124.8 kg 127 kg 127 kg    Examination: General:  Chronically and critically ill adult M NAD  HEENT: BiPAP in place. Padding over nasal bridge  Neuro: Awakens to voice, following commands answering yes/no  questions  CV: irir  PULM:  Diminished  GI: soft round  GU: foley  Extremities: edematous   Resolved Hospital Problem list   Hypomag   Assessment & Plan:   Acute metabolic encephalopathy  -CT H without acute abnormality 2/1. Subsequent CT H 2/6 also stable (precipitated by mild anisocoria)  -think uremia, acidosis, sepsis were drivers. Mildly elevated ammonia - doubt hugely contributory. Mentation improved w tx sepsis and CRRT   P: -Delirium precautions -limit sedating meds  -supportive care as below  -busy activities -- folding towels, paper airplanes etc.  -noct BiPAP   Septic shock 2/2 UTI and multifocal PNA, improved shock Possible component of cardiogenic shock 2/2 HFrEF, but coox have argued against. Dropping coox 2/6-7 might be r/t new afib rvr   P: -mero to complete abx course. 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 Sounds like might stop 2/8 to see if we can transition to iHD   -follow renal fxn  -replace lytes PRN   Elevated LFTs Mild hyperbilirubinemia Mild hyperammonemia  -acute hep neg. Shock liver most favored. Possible congestive hepatopathy component but if septic CM less favored   P: -PRN LFTs, ammonia   DM2  P: -SSI   Elevated trigs -now off clevi   Code status discussion / GOC  P: -spoke w wife 2/3 who verified Full code/ full scope of practice   L/T/D -PICC - cont  -temp HD - cont  -foley - d/w neprho, cont for now   Best Practice (right click and Reselect all SmartList Selections daily)   Diet/type: Regular consistency (see orders) DVT prophylaxis bival  Pressure ulcer(s): pressure ulcer assessment deferred  GI prophylaxis: PPI Lines:  Central line, Dialysis Catheter, and yes and it is still needed Foley:  Yes, and it is still needed Code Status:  full code Last date of multidisciplinary goals of care discussion [wife updated 2/6  Labs   CBC: Recent Labs  Lab 05/06/23 2226 05/07/23 0059 05/09/23 0515 05/09/23 1253 05/10/23 0455  05/11/23 0918 05/12/23 0506 05/12/23 1544 05/13/23 0414  WBC 12.8*   < > 13.0*  --  7.4 7.9 9.6  --  12.3*  NEUTROABS 9.5*  --   --   --   --   --   --   --   --   HGB 16.8   < > 14.3   < > 15.2 15.8 16.0 17.7* 16.8  HCT 57.6*   < > 46.8   < > 47.6 49.4 51.1 52.0 52.2*  MCV 89.6   < > 85.4  --  83.5 83.2 83.1  --  81.8  PLT 316   < > 237  --  140* 128* 127*  --  99*   < > = values in this interval not displayed.    Basic Metabolic Panel: Recent Labs  Lab 05/09/23 0514 05/09/23 1253 05/10/23 0455 05/10/23 0725 05/11/23 0507 05/11/23 1634 05/12/23 0506 05/12/23 1544 05/12/23 1628 05/13/23 0414  NA 135   < > 136   < > 132* 135 134* 136 133* 134*  K 4.7   < > 5.2*   < > 4.8 4.5 3.9 3.6 4.3 4.2  CL 100   < > 102    < > 99 101 101  --  100 101  CO2 22   < > 19*   < > 20* 20* 19*  --  19* 20*  GLUCOSE 153*   < > 182*   < > 210* 140* 226*  --  270* 173*  BUN 35*   < > 29*   < > 31* 32* 31*  --  38* 30*  CREATININE 4.17*   < > 2.96*   < > 2.25* 2.37* 2.46*  --  2.49* 2.34*  CALCIUM  8.0*   < > 8.3*   < > 8.5* 8.3* 8.3*  --  8.2* 8.3*  MG 2.1  --  2.5*  --  2.6*  --  2.5*  --   --  2.6*  PHOS 4.4   < > 3.9   < > 3.5 2.1* 1.7*  --  1.6* 1.6*   < > = values in this interval not displayed.   GFR: Estimated Creatinine Clearance: 48.6 mL/min (A) (by C-G formula based on SCr of 2.34 mg/dL (H)). Recent Labs  Lab 05/06/23 2226 05/07/23 0059 05/07/23 1601 05/08/23 0508 05/09/23 0514 05/09/23 0515 05/09/23 1210 05/10/23 0455 05/11/23 0918 05/12/23 0506 05/13/23 0414  PROCALCITON 0.21  --   --  1.09  --   --   --   --   --   --   --   WBC 12.8*   < >  --  13.2*  --    < >  --  7.4 7.9 9.6 12.3*  LATICACIDVEN 3.7*   < > 1.4 1.7 2.8*  --  1.4  --   --   --   --    < > = values in this interval not displayed.    Liver Function Tests: Recent Labs  Lab 05/08/23 1600 05/09/23 0514 05/09/23 1953 05/10/23 0455 05/10/23 1553 05/11/23 0507 05/11/23 1634 05/12/23 0506 05/12/23 1628 05/13/23 0414  AST 1,882* 1,458*  --  982*  --   --   --  261*  --  113*  ALT 786* 686*  --  564*  --   --   --  307*  --  206*  ALKPHOS 81 81  --  84  --   --   --  116  --  105  BILITOT 2.9* 2.9*  --  3.0*  --   --   --  3.3*  --  2.8*  PROT 7.0 6.9  --  6.4*  --   --   --  7.0  --  7.1  ALBUMIN  3.0*  3.0* 2.9*   < > 2.4*  2.4*   < > 2.5* 2.5* 2.5*  2.6* 2.5* 2.6*  2.6*   < > = values in this interval not displayed.   Recent Labs  Lab 05/08/23 0508  LIPASE 24   Recent Labs  Lab 05/07/23 0836 05/08/23 1600 05/09/23 0953 05/10/23 0455 05/13/23 0806  AMMONIA <10 40* <10 32 41*    ABG    Component Value Date/Time   PHART 7.421 05/12/2023 1544   PCO2ART 28.8 (L) 05/12/2023 1544   PO2ART 61 (L) 05/12/2023  1544   HCO3 18.8 (L) 05/12/2023 1544   TCO2 20 (L) 05/12/2023 1544   ACIDBASEDEF 4.0 (H) 05/12/2023 1544   O2SAT 66.4 05/13/2023 0414     Coagulation Profile: Recent Labs  Lab 05/07/23 0836 05/08/23 1600 05/12/23 0506  INR 1.7* 2.2* 1.1    Cardiac Enzymes: Recent Labs  Lab 05/08/23 0508  CKTOTAL 320  CKMB 3.7    HbA1C: Hemoglobin A1C  Date/Time Value Ref Range Status  11/05/2022 01:58 PM 9.1 (A) 4.0 - 5.6 % Final  08/05/2022 01:54 PM 8.1 (A) 4.0 - 5.6 % Final   HbA1c, POC (prediabetic range)  Date/Time Value Ref Range Status  11/05/2022 01:58 PM 9.1 (A) 5.7 - 6.4 % Final  08/05/2022 01:54 PM 8.1 (A) 5.7 - 6.4 % Final   HbA1c, POC (controlled diabetic range)  Date/Time Value Ref Range Status  11/05/2022 01:58 PM 9.1 (A) 0.0 - 7.0 % Final  08/05/2022 01:54 PM 8.1 (A) 0.0 - 7.0 % Final   HbA1c POC (<> result, manual entry)  Date/Time Value Ref Range Status  11/05/2022 01:58 PM 9.1 4.0 - 5.6 % Final  08/05/2022 01:54 PM 8.1 4.0 - 5.6 % Final   Hgb A1c MFr Bld  Date/Time Value Ref Range Status  05/05/2023 03:46 AM 10.2 (H) 4.8 - 5.6 % Final    Comment:    (NOTE) Pre diabetes:          5.7%-6.4%  Diabetes:              >6.4%  Glycemic control for   <7.0% adults with diabetes     CBG: Recent Labs  Lab 05/12/23 1600 05/12/23 1955 05/13/23 0000 05/13/23 0406 05/13/23 0738  GLUCAP 257* 191* 194* 176* 171*    CRITICAL CARE Performed by: Ronnald FORBES Gave   Total critical care time: 41 minutes  Critical care time was exclusive of separately billable procedures and treating other patients. Critical care was necessary to treat or prevent imminent or life-threatening deterioration.  Critical care was time spent personally by me on the following activities: development of treatment plan with patient and/or surrogate as well as nursing, discussions with consultants, evaluation of patient's response to treatment, examination of patient, obtaining history from  patient or surrogate, ordering and performing treatments and interventions, ordering and review of laboratory studies, ordering and review of radiographic studies, pulse oximetry and re-evaluation of patient's condition.  Ronnald Gave MSN, AGACNP-BC [facility] Amion for pager  05/13/2023, 10:42 AM

## 2023-05-13 NOTE — Progress Notes (Signed)
 Patient cardioverted x 2 to NSR by Sabharwal MD with Zaida Hertz MD and RRT at bedside. Total of 100 mg of propofol  pushed by Chand MD and 50 mcg fentanyl  pushed by this RN during procedure. Patient tolerated well.

## 2023-05-13 NOTE — Progress Notes (Signed)
 Per Dr. Bruce Caper, can stop pulling fluid on CRRT.

## 2023-05-13 NOTE — Progress Notes (Addendum)
 Advanced Heart Failure Rounding Note  HF Cardiologist: Dr. Zenaida   Chief Complaint: Mixed Cardiogenic and Septic Shock    Patient Profile   58 y.o. male with a PMH of obesity, HTN, HLD, DM2, and OSA admitted w/ mixed septic and cardiogenic shock>>progression to anuric renal failure requiring CRRT.  Interval Hx:    2/3 worsening septic shock>> fever up to 101, increase LA, escalation of pressors, delirium and worsening respiratory failure>>intubated. Milrinone  stopped due to frequent ectopy. Abx broadened. Vanc added to meropenum. CT of C/A/P w/ multifocal PNA and s/o cystitis. 2/4 Marked improvement. Resolution of fever and delirium. Weaned off pressors, became hypertensive and started on cleviprex  gtt  2/5 Extubated, weaned off Cleviprex , Cortrak placed (post pyloric)   Subjective:    Put on BiPAP around 5:30 am for hypoxia and AMS. Did not wear overnight.   Patient pulled out his CorTrak.  Opens eyes to voice. Following commands intermittently.   Negative 3L last 24 hrs, remains on CRRT.      Objective:   Weight Range: 127 kg Body mass index is 36.94 kg/m.   Vital Signs:   Temp:  [98 F (36.7 C)-99 F (37.2 C)] 99 F (37.2 C) (02/07 0400) Pulse Rate:  [50-173] 79 (02/07 0630) Resp:  [13-36] 26 (02/07 0630) BP: (84-153)/(45-134) 112/50 (02/07 0630) SpO2:  [80 %-99 %] 97 % (02/07 0630) Arterial Line BP: (103-171)/(45-79) 130/50 (02/06 1753) FiO2 (%):  [60 %] 60 % (02/07 0518) Weight:  [872 kg] 127 kg (02/07 0500) Last BM Date : 05/12/23  Weight change: Filed Weights   05/11/23 0448 05/12/23 0500 05/13/23 0500  Weight: 129.5 kg 124.8 kg 127 kg    Intake/Output:   Intake/Output Summary (Last 24 hours) at 05/13/2023 9287 Last data filed at 05/13/2023 0700 Gross per 24 hour  Intake 1945.77 ml  Output 5002 ml  Net -3056.23 ml      Physical Exam    CVP waveform flat General:  Drowsy. On BiPAP. Neck: L internal jugular HD cath, RUE PICC Cor:  Irregular rhythm, tachy. No rubs, gallops or murmurs. Lungs: clear Abdomen: obese, soft, nondistended.  Extremities: no cyanosis, clubbing, rash, 1+ edema Neuro: Opens eyes to voice and makes eye contact  Labs    CBC Recent Labs    05/12/23 0506 05/12/23 1544 05/13/23 0414  WBC 9.6  --  12.3*  HGB 16.0 17.7* 16.8  HCT 51.1 52.0 52.2*  MCV 83.1  --  81.8  PLT 127*  --  99*   Basic Metabolic Panel Recent Labs    97/93/74 0506 05/12/23 1544 05/12/23 1628 05/13/23 0414  NA 134*   < > 133* 134*  K 3.9   < > 4.3 4.2  CL 101  --  100 101  CO2 19*  --  19* 20*  GLUCOSE 226*  --  270* 173*  BUN 31*  --  38* 30*  CREATININE 2.46*  --  2.49* 2.34*  CALCIUM  8.3*  --  8.2* 8.3*  MG 2.5*  --   --  2.6*  PHOS 1.7*  --  1.6* 1.6*   < > = values in this interval not displayed.   Liver Function Tests Recent Labs    05/12/23 0506 05/12/23 1628 05/13/23 0414  AST 261*  --  113*  ALT 307*  --  206*  ALKPHOS 116  --  105  BILITOT 3.3*  --  2.8*  PROT 7.0  --  7.1  ALBUMIN  2.5*  2.6*  2.5* 2.6*  2.6*   No results for input(s): LIPASE, AMYLASE in the last 72 hours.  Cardiac Enzymes No results for input(s): CKTOTAL, CKMB, CKMBINDEX, TROPONINI in the last 72 hours.   BNP: BNP (last 3 results) Recent Labs    05/04/23 1645  BNP 1,275.2*    ProBNP (last 3 results) No results for input(s): PROBNP in the last 8760 hours.   D-Dimer No results for input(s): DDIMER in the last 72 hours. Hemoglobin A1C No results for input(s): HGBA1C in the last 72 hours. Fasting Lipid Panel Recent Labs    05/12/23 0506  TRIG 200*    Thyroid  Function Tests No results for input(s): TSH, T4TOTAL, T3FREE, THYROIDAB in the last 72 hours.  Invalid input(s): FREET3  Other results:    Medications:     Scheduled Medications:  aspirin  EC  81 mg Oral Daily   bacitracin    Topical BID   Chlorhexidine  Gluconate Cloth  6 each Topical Daily   feeding  supplement  237 mL Oral TID BM   heparin  injection (subcutaneous)  5,000 Units Subcutaneous Q8H   hydrALAZINE   25 mg Oral Q8H   insulin  aspart  0-15 Units Subcutaneous Q4H   lidocaine   1 patch Transdermal Q24H   methocarbamol   500 mg Oral BID   multivitamin  1 tablet Oral QHS   mouth rinse  15 mL Mouth Rinse 4 times per day   oxyCODONE   10 mg Oral Q6H   pantoprazole  (PROTONIX ) IV  40 mg Intravenous Q24H    Infusions:   prismasol  BGK 4/2.5 400 mL/hr at 05/12/23 2300    prismasol  BGK 4/2.5 400 mL/hr at 05/12/23 2130   amiodarone  30 mg/hr (05/13/23 0700)   meropenem  (MERREM ) IV Stopped (05/12/23 2217)   prismasol  BGK 4/2.5 1,500 mL/hr at 05/12/23 1845    PRN Medications: acetaminophen  (TYLENOL ) oral liquid 160 mg/5 mL, albuterol , heparin , HYDROmorphone  (DILAUDID ) injection, naLOXone  (NARCAN )  injection, nitroGLYCERIN , ondansetron  (ZOFRAN ) IV, mouth rinse, mouth rinse, sodium chloride     Assessment/Plan   Mixed cardiogenic/septic shock: Presented febrile, cold on exam, with worsening blood pressure. TEE w/ moderately reduced LVEF. Initially on Milrinone . 2/3 developed worsening septic shock w/ fever, increase in lactic acid, delirium, and worsening respiratory failure>>intubated. Milrinone  stopped due to frequent ectopy. Vanc added to meropenum. CT of C/A/P w/ multifocal PNA and s/o cystitis. UA dirty, Ucx multiple species, Respiratory Cx no growth, Bcx NGTD - Sepsis resolving. Suspect source PNA/UTI.  Now off Vanc. Remains on merrem  through tomorrow Abx per CCM  - Cardiogenic shock resolved. CO-OX 66%.  Limited echo 02/06 with EF 40-45%, RWMA, RV reduced - CRP/ESR only mildly elevated, doubt myocarditis as initially suspected. Suspect likely stress induced CM in setting of sepsis.   Acute systolic CHF - Suspect stress CM in setting of sepsis - Echo 01/30: Unable to estimate EF d/t image quality, RWMA - Limited echo 02/06: EF 40-45%, RWMA, RV reduced - Volume managed with CRRT.  Discussed with Nephrology at bedside. Planning to stop CRRT once filter expires or clots. - CVP waveform flat. Going for RHC today. Suspect RV dysfunction and pulmonary hypertension.  - BP soft. Stop hydralazine   Acute hypoxic and hypercarbic respiratory failure Hx OSA - Secondary to PNA and acute heart failure. Intubated 2/3 - extubated 2/5 - On BiPAP this morning, did not wear last night. Needs to wear BiPAP nightly.  - continue abx per CCM  - Remains on CRRT for volume removal  Atrial flutter with RVR - Noted yesterday am.  -  Loading with IV amiodarone , continue at 30 mg/hr - Plan DCCV today. Will ask CCM to assist with sedation  Thrombocytopenia - Platelets 237 on 02/03>>99K today - Started heparin  02/03. Switch to bival. Check HIT panel  Hypertension  - BP soft this am - Hold hydralazine  for now  AKI: suspect ATN 2/2 mixed shock. Remains anuric.  - continue CRRT until filter expires or clots.  - Nephrology following   Shock liver: - 2/2 mixed shock/hepatic congestion from CHF  - LFTs trending down  Diabetes:  poorly controlled,  A1c 10.2   - insulin  management per CCM   Acute metabolic encephalopathy - CTH 97/94 stable. Obtained for anisocoria - Suspect triggers were sepsis, uremia and acidosis - Ammonia mildly elevated - Needs to war BiPAP at night  Deconditioning - appears extremely weak - Aggressive PT/OT   Called wife and left message to obtain consent for RHC and DCCV today.  CRITICAL CARE Performed by: COLLETTA SHAVER N   Total critical care time: 40 minutes  Critical care time was exclusive of separately billable procedures and treating other patients.  Critical care was necessary to treat or prevent imminent or life-threatening deterioration.  Critical care was time spent personally by me on the following activities: development of treatment plan with patient and/or surrogate as well as nursing, discussions with consultants, evaluation of  patient's response to treatment, examination of patient, obtaining history from patient or surrogate, ordering and performing treatments and interventions, ordering and review of laboratory studies, ordering and review of radiographic studies, pulse oximetry and re-evaluation of patient's condition.   Length of Stay: 8  Wyolene Weimann N, PA-C  05/13/2023, 7:12 AM  Advanced Heart Failure Team Pager [phone] (M-F; 7a - 5p)  Please contact [facility] for night-coverage after hours (5p -7a ) and weekends on [email or web address]

## 2023-05-13 NOTE — Progress Notes (Signed)
 Patient transported from Cath lab to 2H05 without complications. Patient tolerated well. RT will continue to monitor.

## 2023-05-13 NOTE — Procedures (Signed)
 Moderate Conscious Sedation Note    DRAYCEN LEICHTER  [id number]  December 30, 1965   Date:05/13/23  Time:4:08 PM   Provider Performing:Pearlie Nies   Procedure: Moderate Conscious Sedation  During this procedure the patient is administered a total of Propofol  100mg  in divided doses and Fentanyl  100 mcg to achieve and maintain moderate conscious sedation. The patient's heart rate, blood pressure, and oxygen saturation are monitored continuously during the procedure. The period of conscious sedation is 25 minutes, of which I was present face-to-face 100% of this time.     Valinda Novas, MD [facility] See Amion for pager If no response to pager, please call [phone] until 7pm After 7pm, Please call E-link [phone]

## 2023-05-13 NOTE — CV Procedure (Signed)
   TRANSESOPHAGEAL ECHOCARDIOGRAM GUIDED DIRECT CURRENT CARDIOVERSION  NAME:  Dylan Owen    MRN: [id number] DOB:  [birth year]    ADMIT DATE: 05/04/2023  INDICATIONS: Symptomatic atrial flutter   CARDIOVERSION:     Indications:  Symptomatic Atrial Flutter  Procedure Details:  Patient went into atrial flutter less than 24h ago. Started on anticoagulation. The patient had the defibrillator pads placed in the anterior and posterior position. Once an appropriate level of sedation was confirmed by CCM at bedside, the patient was cardioverted x 2 with 200J of biphasic synchronized energy.  The patient converted to NSR.  There were no apparent complications.  The patient had normal neuro status and respiratory status post procedure with vitals stable as recorded elsewhere.  Adequate airway was maintained throughout and vital signs monitored per protocol.  Geran Haithcock Advanced Heart Failure 7:03 PM

## 2023-05-13 NOTE — Progress Notes (Addendum)
 PHARMACY - ANTICOAGULATION CONSULT NOTE  Pharmacy Consult for bivalirudin  Indication: Atrial flutter  Allergies  Allergen Reactions   Ace Inhibitors     Kidney failure   Atorvastatin  Other (See Comments)    Arm pain   Prednisone     REACTION: Anxiety   Septra  [Sulfamethoxazole -Trimethoprim ]    Metformin  And Related Diarrhea    Patient Measurements: Height: 6' 1 (185.4 cm) Weight: 127 kg (279 lb 15.8 oz) IBW/kg (Calculated) : 79.9  Vital Signs: Temp: 98.8 F (37.1 C) (02/07 1603) Temp Source: Axillary (02/07 1603) BP: 145/66 (02/07 1700) Pulse Rate: 94 (02/07 1700)  Labs: Recent Labs    05/11/23 0918 05/11/23 1634 05/12/23 0506 05/12/23 1544 05/12/23 1628 05/13/23 0414 05/13/23 1512  HGB 15.8  --  16.0 17.7*  --  16.8 18.7*  18.7*  HCT 49.4  --  51.1 52.0  --  52.2* 55.0*  55.0*  PLT 128*  --  127*  --   --  99*  --   LABPROT  --   --  14.1  --   --   --   --   INR  --   --  1.1  --   --   --   --   CREATININE  --    < > 2.46*  --  2.49* 2.34*  --    < > = values in this interval not displayed.    Estimated Creatinine Clearance: 48.6 mL/min (A) (by C-G formula based on SCr of 2.34 mg/dL (H)).   Medical History: Past Medical History:  Diagnosis Date   B12 deficiency    Blood transfusion without reported diagnosis    Chronic low back pain    Dr. Orlando is pain mgmt MD   Depression    Diabetes mellitus with complication (HCC)    DPN + microalbuminuria   GERD (gastroesophageal reflux disease)    Heart murmur    History of adenomatous polyp of colon 05/22/2021   recall 7 yrs   Hypercholesterolemia    Statin intolerant.  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  Pharmacy consulted for bivalrudin dosing with concern for HIT - was on prophylactic SQ heparin  since 2/2 while on CRRT with baseline pltc 237, down to 99 today, 4T score ~3 (low probability).  CRRT now stopped post RHC.   Goal of Therapy:  aPTT 50-85 seconds Monitor platelets by anticoagulation protocol: Yes   Plan:  Start bivalirudin  0.1 mg/kg/hr 4 hour aPTT Daily aPTT, CBC   Maurilio Fila, PharmD Clinical Pharmacist 05/13/2023  5:21 PM

## 2023-05-13 NOTE — Interval H&P Note (Signed)
 History and Physical Interval Note:  05/13/2023 2:56 PM  Dylan Owen  has presented today for surgery, with the diagnosis of Heart failure.  The various methods of treatment have been discussed with the patient and family. After consideration of risks, benefits and other options for treatment, the patient has consented to  Procedure(s): RIGHT HEART CATH (N/A) as a surgical intervention.  The patient's history has been reviewed, patient examined, no change in status, stable for surgery.  I have reviewed the patient's chart and labs.  Questions were answered to the patient's satisfaction.     Alysandra Lobue

## 2023-05-13 NOTE — Progress Notes (Signed)
 Patient ID: Dylan Owen, male   DOB: April 10, 1965, 58 y.o.   MRN: [id number] [facility] Progress Note   Assessment/ Plan:   1. Acute kidney Injury: With essentially normal renal function at baseline (creatinine 1.1) and currently anuric.  History/timeline support ATN in the setting of shock; net ultrafiltration 3.0 L overnight with the intention to continue CRRT overnight into tomorrow (stopping on Saturday morning to monitor through the weekend).  If filter needs to be changed overnight, do not restart CRRT. 2.  Mixed cardiogenic/septic shock: Clinically suspected to be associated with viral myocarditis/acute CHF exacerbation.  Ongoing CRRT for ultrafiltration. 3.  Shock liver: Transaminases appear to be trending down with hemodynamic support.  Hemodynamically improving. 4.  Acute hypoxic/hypercarbic respiratory failure: On BiPAP at this time following extubation 2 days ago.  Subjective:   Decreased responsiveness overnight, on BiPAP.   Objective:   BP (!) 87/62   Pulse 79   Temp 99 F (37.2 C)   Resp (!) 26   Ht 6' 1 (1.854 m)   Wt 127 kg   SpO2 96%   BMI 36.94 kg/m   Intake/Output Summary (Last 24 hours) at 05/13/2023 0754 Last data filed at 05/13/2023 0700 Gross per 24 hour  Intake 1945.77 ml  Output 5002 ml  Net -3056.23 ml   Weight change: 2.2 kg  Physical Exam: Gen: Somnolent, on BiPAP CVS: Pulse regular tachycardia S1 and S2 normal Resp: Anteriorly clear to auscultation, no rales/rhonchi Abd: Soft, obese, nontender, bowel sounds normal Ext: 1+ lower extremity edema, 1-2+ upper extremity edema with some dependent edema palpable  Imaging: ECHOCARDIOGRAM LIMITED Result Date: 05/12/2023    ECHOCARDIOGRAM LIMITED REPORT   Patient Name:   Dylan Owen Date of Exam: 05/12/2023 Medical Rec #:  [id number]         Height:       73.0 in Accession #:    [id number]        Weight:       275.1 lb Date of Birth:  08-05-1965         BSA:          2.464 m Patient Age:     58 years          BP:           116/65 mmHg Patient Gender: M                 HR:           123 bpm. Exam Location:  Inpatient Procedure: Limited Echo, Limited Color Doppler and Intracardiac Opacification            Agent Indications:    CHF I50.9  History:        Patient has prior history of Echocardiogram examinations, most                 recent 05/05/2023. CHF, Angina; Risk Factors:Hypertension, Sleep                 Apnea, Diabetes, Dyslipidemia and Current Smoker.  Sonographer:    Thea Norlander RCS Referring Phys: CAFFIE HERO SIMMONS IMPRESSIONS  1. Left ventricular ejection fraction, by estimation, is 40 to 45%. The left ventricle has mildly decreased function. The left ventricle demonstrates regional wall motion abnormalities (see scoring diagram/findings for description). There is mild asymmetric left ventricular hypertrophy of the inferior segment.  2. The mitral valve is normal in structure. Trivial mitral valve regurgitation.  3. The aortic valve is  normal in structure. Aortic valve sclerosis is present, with no evidence of aortic valve stenosis.  4. There is borderline dilatation of the ascending aorta, measuring 36 mm. FINDINGS  Left Ventricle: Left ventricular ejection fraction, by estimation, is 40 to 45%. The left ventricle has mildly decreased function. The left ventricle demonstrates regional wall motion abnormalities. Definity  contrast agent was given IV to delineate the left ventricular endocardial borders. There is mild asymmetric left ventricular hypertrophy of the inferior segment.  LV Wall Scoring: The apical inferior segment and apex are akinetic. The entire anterior wall, entire lateral wall, entire septum, and inferior wall are hypokinetic. Mitral Valve: The mitral valve is normal in structure. Trivial mitral valve regurgitation. Tricuspid Valve: The tricuspid valve is normal in structure. Tricuspid valve regurgitation is mild. Aortic Valve: The aortic valve is normal in structure.  Aortic valve sclerosis is present, with no evidence of aortic valve stenosis. Aorta: There is borderline dilatation of the ascending aorta, measuring 36 mm. Additional Comments: A device lead is visualized.  LEFT VENTRICLE PLAX 2D LVIDd:         5.10 cm LVIDs:         4.10 cm LV PW:         1.20 cm LV IVS:        0.90 cm LVOT diam:     2.00 cm LVOT Area:     3.14 cm  LEFT ATRIUM         Index LA diam:    3.70 cm 1.50 cm/m   AORTA Ao Root diam: 3.50 cm Ao Asc diam:  3.60 cm  SHUNTS Systemic Diam: 2.00 cm Kardie Tobb DO Electronically signed by Dub Huntsman DO Signature Date/Time: 05/12/2023/5:35:58 PM    Final    CT HEAD WO CONTRAST ( ) Result Date: 05/12/2023 CLINICAL DATA:  58 year old male altered mental status, neurologic deficit. EXAM: CT HEAD WITHOUT CONTRAST TECHNIQUE: Contiguous axial images were obtained from the base of the skull through the vertex without intravenous contrast. RADIATION DOSE REDUCTION: This exam was performed according to the departmental dose-optimization program which includes automated exposure control, adjustment of the mA and/or kV according to patient size and/or use of iterative reconstruction technique. COMPARISON:  05/07/2023 head CT and earlier. FINDINGS: Brain: Patchy subcortical white matter hypodensity in the anterior left frontal lobe appears stable since 05/06/2023. Mild additional patchy white matter hypodensity including in the bilateral internal capsules also stable. Small area of left caudate hypodensity is stable. No superimposed No midline shift, ventriculomegaly, mass effect, evidence of mass lesion, intracranial hemorrhage or evidence of cortically based acute infarction. Vascular: No suspicious intracranial vascular hyperdensity. Skull: Stable and intact. Sinuses/Orbits: Visualized paranasal sinuses and mastoids are stable and well aerated. Other: Left nasoenteric tube in place. Negative orbit and scalp soft tissues. IMPRESSION: 1. Stable non contrast CT  appearance of cerebral white matter and left caudate changes since last month, most compatible with small vessel disease. 2.  No acute intracranial abnormality. Electronically Signed   By: VEAR Hurst M.D.   On: 05/12/2023 07:08   DG Abd Portable 1V Result Date: 05/11/2023 CLINICAL DATA:  Feeding tube placement EXAM: PORTABLE ABDOMEN - 1 VIEW COMPARISON:  05/11/2023 FINDINGS: Interval placement of non weighted enteric feeding tube, tip positioned near the descending duodenum. Nonobstructive pattern of included bowel gas. No acute osseous findings. IMPRESSION: Interval placement of non weighted enteric feeding tube, tip positioned near the descending duodenum. Electronically Signed   By: Marolyn JONETTA Jaksch M.D.   On: 05/11/2023 12:01  Labs: BMET Recent Labs  Lab 05/10/23 0455 05/10/23 0725 05/10/23 1553 05/11/23 0507 05/11/23 1634 05/12/23 0506 05/12/23 1544 05/12/23 1628 05/13/23 0414  NA 136 134* 134* 132* 135 134* 136 133* 134*  K 5.2* 5.3* 5.0  4.9 4.8 4.5 3.9 3.6 4.3 4.2  CL 102 98 100 99 101 101  --  100 101  CO2 19* 21* 22 20* 20* 19*  --  19* 20*  GLUCOSE 182* 186* 219* 210* 140* 226*  --  270* 173*  BUN 29* 28* 30* 31* 32* 31*  --  38* 30*  CREATININE 2.96* 2.75* 2.54* 2.25* 2.37* 2.46*  --  2.49* 2.34*  CALCIUM  8.3* 8.7* 8.6* 8.5* 8.3* 8.3*  --  8.2* 8.3*  PHOS 3.9  --  4.0 3.5 2.1* 1.7*  --  1.6* 1.6*   CBC Recent Labs  Lab 05/06/23 2226 05/07/23 0059 05/10/23 0455 05/11/23 0918 05/12/23 0506 05/12/23 1544 05/13/23 0414  WBC 12.8*   < > 7.4 7.9 9.6  --  12.3*  NEUTROABS 9.5*  --   --   --   --   --   --   HGB 16.8   < > 15.2 15.8 16.0 17.7* 16.8  HCT 57.6*   < > 47.6 49.4 51.1 52.0 52.2*  MCV 89.6   < > 83.5 83.2 83.1  --  81.8  PLT 316   < > 140* 128* 127*  --  99*   < > = values in this interval not displayed.    Medications:     aspirin  EC  81 mg Oral Daily   bacitracin    Topical BID   Chlorhexidine  Gluconate Cloth  6 each Topical Daily   feeding supplement   237 mL Oral TID BM   heparin  injection (subcutaneous)  5,000 Units Subcutaneous Q8H   hydrALAZINE   25 mg Oral Q8H   insulin  aspart  0-15 Units Subcutaneous Q4H   lidocaine   1 patch Transdermal Q24H   methocarbamol   500 mg Oral BID   multivitamin  1 tablet Oral QHS   mouth rinse  15 mL Mouth Rinse 4 times per day   oxyCODONE   10 mg Oral Q6H   pantoprazole  (PROTONIX ) IV  40 mg Intravenous Q24H    Gordy Blanch, MD 05/13/2023, 7:54 AM

## 2023-05-13 NOTE — Progress Notes (Signed)
 PT Cancellation Note  Patient Details Name: Dylan Owen MRN: [id number] DOB: 1965/12/15   Cancelled Treatment:    Reason Eval/Treat Not Completed: Medical issues which prohibited therapy (Pt on BiPAP with difficulty with respiratory status. Nursing asking to hold. Planned RHC this afternoon. Will continue to followup as able and appropriate.)  Dorothyann Maier, DPT, CLT  Acute Rehabilitation Services Office: [phone] (Secure chat preferred)   Dorothyann VEAR Maier 05/13/2023, 4:32 PM

## 2023-05-13 NOTE — Progress Notes (Signed)
 Patient transported to Cath lab without complications. Vitals stable throughout. RT will continue to monitor.

## 2023-05-14 DIAGNOSIS — E1165 Type 2 diabetes mellitus with hyperglycemia: Secondary | ICD-10-CM | POA: Diagnosis not present

## 2023-05-14 DIAGNOSIS — I483 Typical atrial flutter: Secondary | ICD-10-CM | POA: Diagnosis not present

## 2023-05-14 DIAGNOSIS — J9601 Acute respiratory failure with hypoxia: Secondary | ICD-10-CM | POA: Diagnosis not present

## 2023-05-14 DIAGNOSIS — N179 Acute kidney failure, unspecified: Secondary | ICD-10-CM | POA: Diagnosis not present

## 2023-05-14 DIAGNOSIS — I5021 Acute systolic (congestive) heart failure: Secondary | ICD-10-CM | POA: Diagnosis not present

## 2023-05-14 LAB — CBC
HCT: 47.3 % (ref 39.0–52.0)
Hemoglobin: 15.2 g/dL (ref 13.0–17.0)
MCH: 26.3 pg (ref 26.0–34.0)
MCHC: 32.1 g/dL (ref 30.0–36.0)
MCV: 81.7 fL (ref 80.0–100.0)
Platelets: 135 10*3/uL — ABNORMAL LOW (ref 150–400)
RBC: 5.79 MIL/uL (ref 4.22–5.81)
RDW: 18.2 % — ABNORMAL HIGH (ref 11.5–15.5)
WBC: 13.6 10*3/uL — ABNORMAL HIGH (ref 4.0–10.5)
nRBC: 0.3 % — ABNORMAL HIGH (ref 0.0–0.2)

## 2023-05-14 LAB — RENAL FUNCTION PANEL
Albumin: 2 g/dL — ABNORMAL LOW (ref 3.5–5.0)
Anion gap: 18 — ABNORMAL HIGH (ref 5–15)
BUN: 62 mg/dL — ABNORMAL HIGH (ref 6–20)
CO2: 18 mmol/L — ABNORMAL LOW (ref 22–32)
Calcium: 7.5 mg/dL — ABNORMAL LOW (ref 8.9–10.3)
Chloride: 92 mmol/L — ABNORMAL LOW (ref 98–111)
Creatinine, Ser: 4.68 mg/dL — ABNORMAL HIGH (ref 0.61–1.24)
GFR, Estimated: 14 mL/min — ABNORMAL LOW (ref >60)
Glucose, Bld: 405 mg/dL — ABNORMAL HIGH (ref 70–99)
Phosphorus: 3.4 mg/dL (ref 2.5–4.6)
Potassium: 4.7 mmol/L (ref 3.5–5.1)
Sodium: 128 mmol/L — ABNORMAL LOW (ref 135–145)

## 2023-05-14 LAB — HEPARIN INDUCED PLATELET AB (HIT ANTIBODY): Heparin Induced Plt Ab: 0.085 {OD_unit} (ref 0.000–0.400)

## 2023-05-14 LAB — APTT
aPTT: 89 s — ABNORMAL HIGH (ref 24–36)
aPTT: 168 s (ref 24–36)
aPTT: 86 s — ABNORMAL HIGH (ref 24–36)
aPTT: 80 s — ABNORMAL HIGH (ref 24–36)

## 2023-05-14 LAB — MAGNESIUM: Magnesium: 2.6 mg/dL — ABNORMAL HIGH (ref 1.7–2.4)

## 2023-05-14 LAB — GLUCOSE, CAPILLARY
Glucose-Capillary: 184 mg/dL — ABNORMAL HIGH (ref 70–99)
Glucose-Capillary: 196 mg/dL — ABNORMAL HIGH (ref 70–99)
Glucose-Capillary: 211 mg/dL — ABNORMAL HIGH (ref 70–99)
Glucose-Capillary: 220 mg/dL — ABNORMAL HIGH (ref 70–99)
Glucose-Capillary: 272 mg/dL — ABNORMAL HIGH (ref 70–99)
Glucose-Capillary: 287 mg/dL — ABNORMAL HIGH (ref 70–99)
Glucose-Capillary: 313 mg/dL — ABNORMAL HIGH (ref 70–99)

## 2023-05-14 LAB — HEPATIC FUNCTION PANEL
ALT: 113 U/L — ABNORMAL HIGH (ref 0–44)
AST: 55 U/L — ABNORMAL HIGH (ref 15–41)
Albumin: 2.3 g/dL — ABNORMAL LOW (ref 3.5–5.0)
Alkaline Phosphatase: 97 U/L (ref 38–126)
Bilirubin, Direct: 1.2 mg/dL — ABNORMAL HIGH (ref 0.0–0.2)
Indirect Bilirubin: 1.3 mg/dL — ABNORMAL HIGH (ref 0.3–0.9)
Total Bilirubin: 2.5 mg/dL — ABNORMAL HIGH (ref 0.0–1.2)
Total Protein: 6.4 g/dL — ABNORMAL LOW (ref 6.5–8.1)

## 2023-05-14 LAB — BASIC METABOLIC PANEL
Anion gap: 16 — ABNORMAL HIGH (ref 5–15)
BUN: 46 mg/dL — ABNORMAL HIGH (ref 6–20)
CO2: 20 mmol/L — ABNORMAL LOW (ref 22–32)
Calcium: 8.2 mg/dL — ABNORMAL LOW (ref 8.9–10.3)
Chloride: 99 mmol/L (ref 98–111)
Creatinine, Ser: 4.05 mg/dL — ABNORMAL HIGH (ref 0.61–1.24)
GFR, Estimated: 16 mL/min — ABNORMAL LOW (ref >60)
Glucose, Bld: 188 mg/dL — ABNORMAL HIGH (ref 70–99)
Potassium: 4.3 mmol/L (ref 3.5–5.1)
Sodium: 135 mmol/L (ref 135–145)

## 2023-05-14 LAB — PHOSPHORUS: Phosphorus: 5.3 mg/dL — ABNORMAL HIGH (ref 2.5–4.6)

## 2023-05-14 LAB — PROTIME-INR
INR: 2.3 — ABNORMAL HIGH (ref 0.8–1.2)
Prothrombin Time: 25.2 s — ABNORMAL HIGH (ref 11.4–15.2)

## 2023-05-14 MED ORDER — INSULIN GLARGINE-YFGN 100 UNIT/ML ~~LOC~~ SOLN
10.0000 [IU] | Freq: Every day | SUBCUTANEOUS | Status: DC
  Administered 2023-05-14 – 2023-05-15 (×2): 10 [IU] via SUBCUTANEOUS
  Filled 2023-05-14 (×3): qty 0.1

## 2023-05-14 NOTE — Progress Notes (Addendum)
 PHARMACY - ANTICOAGULATION Pharmacy Consult for bivalirudin  Indication: Atrial flutter  Allergies  Allergen Reactions   Ace Inhibitors     Kidney failure   Atorvastatin  Other (See Comments)    Arm pain   Prednisone     REACTION: Anxiety   Septra  [Sulfamethoxazole -Trimethoprim ]    Metformin  And Related Diarrhea    Patient Measurements: Height: 6' 1 (185.4 cm) Weight: 127 kg (279 lb 15.8 oz) IBW/kg (Calculated) : 79.9  Vital Signs: Temp: 98.3 F (36.8 C) (02/07 2355) Temp Source: Tympanic (02/07 2355) BP: 108/61 (02/07 2000) Pulse Rate: 96 (02/08 0342)  Labs: Recent Labs    05/12/23 0506 05/12/23 1544 05/12/23 1628 05/13/23 0414 05/13/23 1512 05/13/23 1643 05/13/23 1952 05/13/23 2147 05/14/23 0410  HGB 16.0   < >  --  16.8 18.7*  18.7*  --   --   --  15.2  HCT 51.1   < >  --  52.2* 55.0*  55.0*  --   --   --  47.3  PLT 127*  --   --  99*  --   --   --   --  135*  APTT  --   --   --   --   --   --  89* 94* 89*  LABPROT 14.1  --   --   --   --   --   --   --  25.2*  INR 1.1  --   --   --   --   --   --   --  2.3*  CREATININE 2.46*  --  2.49* 2.34*  --  2.53*  --   --   --    < > = values in this interval not displayed.    Estimated Creatinine Clearance: 45 mL/min (A) (by C-G formula based on SCr of 2.53 mg/dL (H)).  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 Patient remains stable with no signs of bleeding.  Reduced bivalirudin  to 0.05 mg/kg/hour Recheck aPTT in 4 hours (1800)  Signe Dawn, PharmD PGY2 Cardiology Pharmacy Resident

## 2023-05-14 NOTE — Progress Notes (Signed)
 Patient ID: Dylan Owen, male   DOB: [birth year], 58 y.o.   MRN: [id number] [facility] Progress Note   Assessment/ Plan:   1. Acute kidney Injury: With essentially normal renal function at baseline (creatinine 1.1) and currently anuric.  History/timeline support ATN in the setting of shock; CRRT discontinued yesterday evening after ultrafiltration of 1 L and overnight is net +1.2 L.  He remains anuric and without any evidence of renal recovery, the plan is to monitor urine output/labs over the weekend to assess for any potential indicators of recovery.  CRRT will be available if labs show critical abnormality or respiratory status worsens secondary to volume. 2.  Mixed cardiogenic/septic shock: Clinically suspected to be associated with viral myocarditis/acute CHF exacerbation.  Significant ultrafiltration with CRRT of net -16 L; overnight with positive fluid balance of 1.2 L and marginal urine output. 3.  Shock liver: Transaminases appear to be trending down with hemodynamic support.  Hemodynamically improving. 4.  Acute hypoxic/hypercarbic respiratory failure: Was transitioned from ventilator to transient/as needed NIPPV about 3 days ago and currently he is resting comfortably on oxygen via nasal cannula.  Subjective:   CRRT discontinued overnight.  He is more awake and alert this morning.   Objective:   BP 124/75   Pulse (!) 103   Temp 98.3 F (36.8 C) (Tympanic)   Resp (!) 21   Ht 6' 1 (1.854 m)   Wt 123.1 kg   SpO2 93%   BMI 35.81 kg/m   Intake/Output Summary (Last 24 hours) at 05/14/2023 9192 Last data filed at 05/14/2023 0700 Gross per 24 hour  Intake 2110.74 ml  Output 891 ml  Net 1219.74 ml   Weight change: 0 kg  Physical Exam: Gen: Awake/alert, appears comfortable on oxygen via nasal cannula CVS: Pulse regular tachycardia S1 and S2 normal Resp: Anteriorly clear to auscultation, no rales/rhonchi Abd: Soft, obese, nontender, bowel sounds normal Ext: 1+  bilateral lower extremity edema with skin wrinkling, trace-1+ upper extremity edema with some dependent edema  Imaging: CARDIAC CATHETERIZATION Result Date: 05/13/2023 HEMODYNAMICS: RA:   6 mmHg (mean) RV:   44/2-7 mmHg PA:   39/26 mmHg (33 mean) PCWP:  10 mmHg (mean)    Estimated Fick CO/CI   5.78 L/min, 2.3 L/min/m2 Thermodilution CO/CI   5 L/min, 2 L/min/m2     TPG    23  mmHg     PVR     4.6-4 Wood Units PAPi      2.2 IMPRESSION: Normal right and left heart filling pressures. 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 Moderately reduced cardiac output/index Aditya Sabharwal 3:19 PM   DG CHEST PORT 1 VIEW Result Date: 05/13/2023 CLINICAL DATA:  Respiratory failure. EXAM: PORTABLE CHEST 1 VIEW COMPARISON:  May 09, 2023. FINDINGS: Stable cardiomediastinal silhouette. Endotracheal and nasogastric tubes have been removed. Stable left internal jugular catheter and right-sided PICC line. Stable bibasilar atelectasis or infiltrates. Bony thorax is unremarkable. IMPRESSION: Stable bibasilar atelectasis or infiltrates. Electronically Signed   By: Lynwood Landy Raddle M.D.   On: 05/13/2023 12:18   ECHOCARDIOGRAM LIMITED Result Date: 05/12/2023    ECHOCARDIOGRAM LIMITED REPORT   Patient Name:   Dylan Owen Date of Exam: 05/12/2023 Medical Rec #:  [id number]         Height:       73.0 in Accession #:    [id number]        Weight:  275.1 lb Date of Birth:  [birth year]         BSA:          2.464 m Patient Age:    58 years          BP:           116/65 mmHg Patient Gender: M                 HR:           123 bpm. Exam Location:  Inpatient Procedure: Limited Echo, Limited Color Doppler and Intracardiac Opacification            Agent Indications:    CHF I50.9  History:        Patient has prior history of Echocardiogram examinations, most                 recent 05/05/2023. CHF, Angina; Risk Factors:Hypertension, Sleep                 Apnea, Diabetes, Dyslipidemia and Current  Smoker.  Sonographer:    Thea Norlander RCS Referring Phys: CAFFIE HERO SIMMONS IMPRESSIONS  1. Left ventricular ejection fraction, by estimation, is 40 to 45%. The left ventricle has mildly decreased function. The left ventricle demonstrates regional wall motion abnormalities (see scoring diagram/findings for description). There is mild asymmetric left ventricular hypertrophy of the inferior segment.  2. The mitral valve is normal in structure. Trivial mitral valve regurgitation.  3. The aortic valve is normal in structure. Aortic valve sclerosis is present, with no evidence of aortic valve stenosis.  4. There is borderline dilatation of the ascending aorta, measuring 36 mm. FINDINGS  Left Ventricle: Left ventricular ejection fraction, by estimation, is 40 to 45%. The left ventricle has mildly decreased function. The left ventricle demonstrates regional wall motion abnormalities. Definity  contrast agent was given IV to delineate the left ventricular endocardial borders. There is mild asymmetric left ventricular hypertrophy of the inferior segment.  LV Wall Scoring: The apical inferior segment and apex are akinetic. The entire anterior wall, entire lateral wall, entire septum, and inferior wall are hypokinetic. Mitral Valve: The mitral valve is normal in structure. Trivial mitral valve regurgitation. Tricuspid Valve: The tricuspid valve is normal in structure. Tricuspid valve regurgitation is mild. Aortic Valve: The aortic valve is normal in structure. Aortic valve sclerosis is present, with no evidence of aortic valve stenosis. Aorta: There is borderline dilatation of the ascending aorta, measuring 36 mm. Additional Comments: A device lead is visualized.  LEFT VENTRICLE PLAX 2D LVIDd:         5.10 cm LVIDs:         4.10 cm LV PW:         1.20 cm LV IVS:        0.90 cm LVOT diam:     2.00 cm LVOT Area:     3.14 cm  LEFT ATRIUM         Index LA diam:    3.70 cm 1.50 cm/m   AORTA Ao Root diam: 3.50 cm Ao Asc diam:   3.60 cm  SHUNTS Systemic Diam: 2.00 cm Kardie Tobb DO Electronically signed by Dub Huntsman DO Signature Date/Time: 05/12/2023/5:35:58 PM    Final     Labs: BMET Recent Labs  Lab 05/11/23 0507 05/11/23 1634 05/12/23 0506 05/12/23 1544 05/12/23 1628 05/13/23 0414 05/13/23 1512 05/13/23 1643 05/13/23 1952 05/14/23 0410  NA 132* 135 134* 136 133* 134* 139  139 133*  --  135  K 4.8 4.5 3.9 3.6 4.3 4.2 4.2  4.2 3.8  --  4.3  CL 99 101 101  --  100 101  --  99  --  99  CO2 20* 20* 19*  --  19* 20*  --  16*  --  20*  GLUCOSE 210* 140* 226*  --  270* 173*  --  359*  --  188*  BUN 31* 32* 31*  --  38* 30*  --  30*  --  46*  CREATININE 2.25* 2.37* 2.46*  --  2.49* 2.34*  --  2.53*  --  4.05*  CALCIUM  8.5* 8.3* 8.3*  --  8.2* 8.3*  --  7.2*  --  8.2*  PHOS 3.5 2.1* 1.7*  --  1.6* 1.6*  --  4.8* 5.2* 5.3*   CBC Recent Labs  Lab 05/11/23 0918 05/12/23 0506 05/12/23 1544 05/13/23 0414 05/13/23 1512 05/14/23 0410  WBC 7.9 9.6  --  12.3*  --  13.6*  HGB 15.8 16.0 17.7* 16.8 18.7*  18.7* 15.2  HCT 49.4 51.1 52.0 52.2* 55.0*  55.0* 47.3  MCV 83.2 83.1  --  81.8  --  81.7  PLT 128* 127*  --  99*  --  135*    Medications:     aspirin  EC  81 mg Oral Daily   bacitracin    Topical BID   Chlorhexidine  Gluconate Cloth  6 each Topical Daily   feeding supplement  237 mL Oral TID BM   insulin  aspart  0-15 Units Subcutaneous Q4H   lidocaine   1 patch Transdermal Q24H   methocarbamol   500 mg Oral BID   multivitamin  1 tablet Oral QHS   mouth rinse  15 mL Mouth Rinse 4 times per day   oxyCODONE   10 mg Oral Q6H   pantoprazole  (PROTONIX ) IV  40 mg Intravenous QHS    Gordy Blanch, MD 05/14/2023, 8:07 AM

## 2023-05-14 NOTE — Progress Notes (Signed)
 Advanced Heart Failure Rounding Note  HF Cardiologist: Dr. Zenaida   Chief Complaint: Mixed Cardiogenic and Septic Shock    Patient Profile   58 y.o. male with a PMH of obesity, HTN, HLD, DM2, and OSA admitted w/ mixed septic and cardiogenic shock>>progression to anuric renal failure requiring CRRT.  Interval Hx:    2/3 worsening septic shock>> fever up to 101, increase LA, escalation of pressors, delirium and worsening respiratory failure>>intubated. Milrinone  stopped due to frequent ectopy. Abx broadened. Vanc added to meropenum. CT of C/A/P w/ multifocal PNA and s/o cystitis. 2/4 Marked improvement. Resolution of fever and delirium. Weaned off pressors, became hypertensive and started on cleviprex  gtt  2/5 Extubated, weaned off Cleviprex , Cortrak placed (post pyloric) 2/8: RHC, DCCV to NSR.    Subjective:    -Cardioverted to normal sinus rhythm yesterday. -Right heart cath with low filling pressures -Doing well this morning with no complaints.  Remains very weak  Objective:   Weight Range: 123.1 kg Body mass index is 35.81 kg/m.   Vital Signs:   Temp:  [97.6 F (36.4 C)-98.8 F (37.1 C)] 97.6 F (36.4 C) (02/08 1115) Pulse Rate:  [78-128] 107 (02/08 1200) Resp:  [16-29] 23 (02/08 1200) BP: (52-145)/(21-107) 99/54 (02/08 1200) SpO2:  [91 %-100 %] 91 % (02/08 1200) FiO2 (%):  [60 %-100 %] 60 % (02/08 0342) Weight:  [123.1 kg] 123.1 kg (02/08 0500) Last BM Date : 05/13/23  Weight change: Filed Weights   05/13/23 0500 05/13/23 1030 05/14/23 0500  Weight: 127 kg 127 kg 123.1 kg    Intake/Output:   Intake/Output Summary (Last 24 hours) at 05/14/2023 1230 Last data filed at 05/14/2023 1200 Gross per 24 hour  Intake 2644.81 ml  Output 171 ml  Net 2473.81 ml      Physical Exam   General: Awake/alert/conversational Neck: L internal jugular HD cath, RUE PICC Cor: Regular rate and rhythm Lungs: Lung sounds Abdomen: obese, soft, nondistended.  Extremities:  no cyanosis, clubbing, rash, no edema Neuro: Wake/alert with no focal neurologic deficits  Labs    CBC Recent Labs    05/13/23 0414 05/13/23 1512 05/14/23 0410  WBC 12.3*  --  13.6*  HGB 16.8 18.7*  18.7* 15.2  HCT 52.2* 55.0*  55.0* 47.3  MCV 81.8  --  81.7  PLT 99*  --  135*   Basic Metabolic Panel Recent Labs    97/92/74 0414 05/13/23 1512 05/13/23 1643 05/13/23 1952 05/14/23 0410  NA 134*   < > 133*  --  135  K 4.2   < > 3.8  --  4.3  CL 101  --  99  --  99  CO2 20*  --  16*  --  20*  GLUCOSE 173*  --  359*  --  188*  BUN 30*  --  30*  --  46*  CREATININE 2.34*  --  2.53*  --  4.05*  CALCIUM  8.3*  --  7.2*  --  8.2*  MG 2.6*  --   --   --  2.6*  PHOS 1.6*  --  4.8* 5.2* 5.3*   < > = values in this interval not displayed.   Liver Function Tests Recent Labs    05/13/23 0414 05/13/23 1643 05/14/23 0410  AST 113*  --  55*  ALT 206*  --  113*  ALKPHOS 105  --  97  BILITOT 2.8*  --  2.5*  PROT 7.1  --  6.4*  ALBUMIN   2.6*  2.6* 2.1* 2.3*   No results for input(s): LIPASE, AMYLASE in the last 72 hours.  Cardiac Enzymes No results for input(s): CKTOTAL, CKMB, CKMBINDEX, TROPONINI in the last 72 hours.   BNP: BNP (last 3 results) Recent Labs    05/04/23 1645  BNP 1,275.2*    ProBNP (last 3 results) No results for input(s): PROBNP in the last 8760 hours.   D-Dimer No results for input(s): DDIMER in the last 72 hours. Hemoglobin A1C No results for input(s): HGBA1C in the last 72 hours. Fasting Lipid Panel Recent Labs    05/12/23 0506  TRIG 200*    Thyroid  Function Tests No results for input(s): TSH, T4TOTAL, T3FREE, THYROIDAB in the last 72 hours.  Invalid input(s): FREET3  Other results:    Medications:     Scheduled Medications:  aspirin  EC  81 mg Oral Daily   bacitracin    Topical BID   Chlorhexidine  Gluconate Cloth  6 each Topical Daily   feeding supplement  237 mL Oral TID BM   insulin  aspart   0-15 Units Subcutaneous Q4H   lidocaine   1 patch Transdermal Q24H   methocarbamol   500 mg Oral BID   multivitamin  1 tablet Oral QHS   mouth rinse  15 mL Mouth Rinse 4 times per day   oxyCODONE   10 mg Oral Q6H   pantoprazole  (PROTONIX ) IV  40 mg Intravenous QHS    Infusions:   prismasol  BGK 4/2.5 400 mL/hr at 05/13/23 0957    prismasol  BGK 4/2.5 400 mL/hr at 05/13/23 0957   amiodarone  60 mg/hr (05/14/23 1200)   bivalirudin  (ANGIOMAX ) 250 mg in sodium chloride  0.9 % 500 mL (0.5 mg/mL) infusion 0.06 mg/kg/hr (05/14/23 1200)   meropenem  (MERREM ) IV Stopped (05/14/23 1037)   norepinephrine  (LEVOPHED ) Adult infusion Stopped (05/13/23 1746)   prismasol  BGK 4/2.5 1,500 mL/hr at 05/13/23 1000    PRN Medications: acetaminophen  (TYLENOL ) oral liquid 160 mg/5 mL, albuterol , HYDROmorphone  (DILAUDID ) injection, naLOXone  (NARCAN )  injection, nitroGLYCERIN , ondansetron  (ZOFRAN ) IV, mouth rinse, sodium chloride     Assessment/Plan   Mixed cardiogenic/septic shock: Presented febrile, cold on exam, with worsening blood pressure. TEE w/ moderately reduced LVEF. Initially on Milrinone . 2/3 developed worsening septic shock w/ fever, increase in lactic acid, delirium, and worsening respiratory failure>>intubated. Milrinone  stopped due to frequent ectopy. Vanc added to meropenum. CT of C/A/P w/ multifocal PNA and s/o cystitis. UA dirty, Ucx multiple species, Respiratory Cx no growth, Bcx NGTD -Right heart cath yesterday with cardiac index of 2 to 2.3 L/min/m while in atrial flutter. -Off all pressors today.  Will repeat mixed venous -Start hydralazine  10 mg 3 times daily once blood pressure allows.  Acute systolic CHF - Suspect stress CM in setting of sepsis - Echo 01/30: Unable to estimate EF d/t image quality, RWMA - Limited echo 02/06: EF 40-45%, RWMA, RV reduced -Right heart catheterization with cardiac index of 2-2.3 while in atrial flutter.  I suspect his LVEF will improve now that he is out of  septic shock. -Euvolemic on exam today.  Acute hypoxic and hypercarbic respiratory failure Hx OSA - Secondary to PNA and acute heart failure. Intubated 2/3 - extubated 2/5 -Continue nightly BiPAP.  Atrial flutter with RVR -Cardioverted yesterday to normal sinus rhythm. -Transition to p.o. amiodarone  tomorrow, for 100 mg twice daily.  Thrombocytopenia - Platelets 237 on 02/03>>99K today - Started heparin  02/03. Switch to bival. Check HIT panel -Currently on bivalirudin .  Can transition to DOAC after deciding on tunneled dialysis catheter.  Hypertension  - BP soft this am - Hold hydralazine  for now  AKI: suspect ATN 2/2 mixed shock. Remains anuric.  -Remains in an uric renal failure with serum creatinine up to 4 today.  At this point it looks like he will require IHD, however, filling pressures were very low yesterday.  We can try high-dose diuretics tomorrow.  Following nephrology recommendations.   Shock liver: - 2/2 mixed shock/hepatic congestion from CHF  -Proving  Diabetes:  poorly controlled,  A1c 10.2   - insulin  management per CCM   Acute metabolic encephalopathy -Solved  Deconditioning - appears extremely weak - Aggressive PT/OT  Spoke with wife at length today.  Discussed plan with CCM and Pharm.D.  CRITICAL CARE Performed by: Ria Commander   Total critical care time: 40 minutes  Critical care time was exclusive of separately billable procedures and treating other patients.  Critical care was necessary to treat or prevent imminent or life-threatening deterioration.  Critical care was time spent personally by me on the following activities: development of treatment plan with patient and/or surrogate as well as nursing, discussions with consultants, evaluation of patient's response to treatment, examination of patient, obtaining history from patient or surrogate, ordering and performing treatments and interventions, ordering and review of laboratory  studies, ordering and review of radiographic studies, pulse oximetry and re-evaluation of patient's condition.   Length of Stay: 9  Nerine Pulse, DO  05/14/2023, 12:30 PM  Advanced Heart Failure Team Pager [phone] (M-F; 7a - 5p)  Please contact [facility] for night-coverage after hours (5p -7a ) and weekends on [email or web address]

## 2023-05-14 NOTE — Plan of Care (Signed)
  Problem: Coping: Goal: Ability to adjust to condition or change in health will improve Outcome: Progressing   Problem: Metabolic: Goal: Ability to maintain appropriate glucose levels will improve Outcome: Progressing   Problem: Nutritional: Goal: Maintenance of adequate nutrition will improve Outcome: Progressing   Problem: Clinical Measurements: Goal: Ability to maintain clinical measurements within normal limits will improve Outcome: Progressing Goal: Respiratory complications will improve Outcome: Progressing Goal: Cardiovascular complication will be avoided Outcome: Progressing   Problem: Coping: Goal: Level of anxiety will decrease Outcome: Progressing   Problem: Elimination: Goal: Will not experience complications related to bowel motility Outcome: Progressing   Problem: Pain Managment: Goal: General experience of comfort will improve and/or be controlled Outcome: Progressing   Problem: Safety: Goal: Ability to remain free from injury will improve Outcome: Progressing

## 2023-05-14 NOTE — Progress Notes (Signed)
 PHARMACY - ANTICOAGULATION Pharmacy Consult for bivalirudin  Indication: Atrial flutter  Allergies  Allergen Reactions   Ace Inhibitors     Kidney failure   Atorvastatin  Other (See Comments)    Arm pain   Prednisone     REACTION: Anxiety   Septra  [Sulfamethoxazole -Trimethoprim ]    Metformin  And Related Diarrhea    Patient Measurements: Height: 6' 1 (185.4 cm) Weight: 123.1 kg (271 lb 6.2 oz) IBW/kg (Calculated) : 79.9  Vital Signs: Temp: 97.5 F (36.4 C) (02/08 1600) Temp Source: Oral (02/08 1600) BP: 119/107 (02/08 1800) Pulse Rate: 101 (02/08 1800)  Labs: Recent Labs    05/12/23 0506 05/12/23 1544 05/13/23 0414 05/13/23 1512 05/13/23 1643 05/13/23 1952 05/14/23 0410 05/14/23 1025 05/14/23 1214 05/14/23 1627 05/14/23 1742  HGB 16.0   < > 16.8 18.7*  18.7*  --   --  15.2  --   --   --   --   HCT 51.1   < > 52.2* 55.0*  55.0*  --   --  47.3  --   --   --   --   PLT 127*  --  99*  --   --   --  135*  --   --   --   --   APTT  --   --   --   --   --    < > 89* 168* 86*  --  80*  LABPROT 14.1  --   --   --   --   --  25.2*  --   --   --   --   INR 1.1  --   --   --   --   --  2.3*  --   --   --   --   CREATININE 2.46*   < > 2.34*  --  2.53*  --  4.05*  --   --  4.68*  --    < > = values in this interval not displayed.    Estimated Creatinine Clearance: 23.9 mL/min (A) (by C-G formula based on SCr of 4.68 mg/dL (H)).  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  aPTT therapeutic (80 sec) on bivalirudin  0.05 mg/kg/hr. No bleeding noted.  Goal of Therapy:  aPTT 50-85 seconds Monitor platelets by anticoagulation protocol: Yes   Plan:  Continue bivalirudin  at 0.05 mg/kg/hr (dosing wt 127 kg) F/u a.m. aPTT  Vito Ralph, PharmD, BCPS Please see amion for complete clinical pharmacist phone list 05/14/2023 6:50 PM

## 2023-05-14 NOTE — Plan of Care (Signed)
   Problem: Education: Goal: Ability to describe self-care measures that may prevent or decrease complications (Diabetes Survival Skills Education) will improve Outcome: Progressing

## 2023-05-14 NOTE — Progress Notes (Signed)
 NAME:  Dylan Owen, MRN:  [id number], DOB:  [birth year], LOS: 9 ADMISSION DATE:  05/04/2023, CONSULTATION DATE:  05/07/23 REFERRING MD:  Nilda, CHIEF COMPLAINT:  resp failure shock    History of Present Illness:  58 year old male with significant obesity, hypertension, hyperlipidemia, type 2 diabetes, sleep apnea, chronic back pain, depression, acid reflux.  Also depression significant home medications include Lasix , hydrochlorothiazide .  Lisinopril , metoprolol , nitroglycerin  but also Robaxin  and MS Contin  30 mg twice daily along with Repatha .  And Effexor .  Along with insulin  for diabetes.  She he walks around with a cane at baseline.  He also admits to medication noncompliance.  And 10 pound weight gain he is not on oxygen at home.  His hemoglobin A1c was 10.2 at admission consistent with very poor compliance.   Admitted on 05/04/2023 with complaints of cough, sore throat, dizziness and bodyaches for 1 week.  Also included chest tightness headaches chills diaphoresis.  At the time of arrival to the ED also had dyspnea on exertion.  At admission BNP was 1275.  Respiratory virus panel was negative.  He was started on Lasix  60 twice daily and his home lisinopril .  He was initially hypertensive and with this management blood pressure improved.  Echocardiogram showed low ejection fraction approximately 25% although poor acoustic windows.  Chest x-ray suggestive of venous congestion.  Cardiology consultation 05/06/2023 noted net -1.6 L since admission and patient was given history of increased frequency of urination.  Diagnose of viral myocarditis was made and left a right heart catheterization was considered.  Troponin barely elevated and consistent with viral myocarditis.   Postadmission with diuresis as of 05/06/2023 creatinine rose up to 1.57 mg percent [baseline 1 mg percent].  At this point lisinopril  was held.  811/31/25 developed hypoglycemia requiring infusion of dextrose  and transferred to the  intensive care unit.  Post transfer to the intensive care unit he developed acute fever and also BiPAP dependency.  Along with worsening renal failure further to creatinine of 2.83 mg percent along with persistent lactic acidosis around 3.  And mild intermittent delirium.  Overnight diaphoresis reported but despite all this nursing consistently reports that his periphery is extremely cold.   Cardiology has been reconsulted.  Critical care medicine consulted.  Pertinent  Medical History  B12 deficiency, Blood transfusion without reported diagnosis, Chronic low back pain, Depression, Diabetes mellitus with complication (HCC), GERD (gastroesophageal reflux disease), Heart murmur, History of adenomatous polyp of colon (05/22/2021), Hypercholesterolemia, Hypertension, OSA (obstructive sleep apnea), Peripheral neuropathy, Persistent asthma, Seizures (HCC), Stable angina (HCC), Statin intolerance, and Subclinical hypothyroidism (06/2017).    reports that he has never smoked. His smokeless tobacco use includes chew.  Significant Hospital Events: Including procedures, antibiotic start and stop dates in addition to other pertinent events   1/29 admit to [facility], diuresis 1/30 ECHO w LVEF 25% 1/31 cards consult, c/f viral myocarditis  2/1 PCCM consult txf to [facility], Adv HF consult  2/2 for HD cath placement and CRRT. Incr D10 for persistent hypogly   2/3 milrinone  stopped. Weaning NE; intubated due to being on bipap for a few days and needing to go to CT  2/5 extubated. Cleviprex  for hypertension   2/6 CT H for anisocoria, no acute findings. Off clevi. Adding PRN metop. CRRT UF decr to 150/hr.  2/7 on BiPAP. RHC and DCCV  2/8 off crrt   Interim History / Subjective:   Off crrt BMP reflective of being off CRRT-- cr bun phos mag up   Objective  Blood pressure (!) 99/54, pulse (!) 107, temperature 97.6 F (36.4 C), temperature source Oral, resp. rate (!) 23, height 6' 1 (1.854 m), weight 123.1 kg, SpO2  91%.    Vent Mode: PCV FiO2 (%):  [60 %-100 %] 60 % Set Rate:  [14 bmp] 14 bmp PEEP:  [5 cmH20] 5 cmH20   Intake/Output Summary (Last 24 hours) at 05/14/2023 1202 Last data filed at 05/14/2023 1200 Gross per 24 hour  Intake 2644.81 ml  Output 171 ml  Net 2473.81 ml   Filed Weights   05/13/23 0500 05/13/23 1030 05/14/23 0500  Weight: 127 kg 127 kg 123.1 kg    Examination: General:  chronically and acutely ill M NAD  HEENT: NCAT pink mm  Neuro: AAOx2-3  CV: tachycardic, regular  PULM:  diminished  GI: soft ndnt  GU: foley  Extremities: no acute joint deformity, some extremity edema. RUE PICC Skin: scattered ecchymosis   Resolved Hospital Problem list   Hypomag   Assessment & Plan:    Acute metabolic encephalopathy  P: -Delirium precautions -busy activities -- folding towels paper airplanes coloring busy board etc  -supportive care as below   Septic shock -- UTI, multifocal PNA, shock improved  -there was initial concern for cardiogenic shock w HFrEF, possible viral myocarditis but coox et al have been assuring against.  -course has been most c/w septic shock, compounded by septic cardiomyopathy + pHTN, sequelae of multisystem organ dysfxn  -cx have not identified an org, however clinically he continued to decline until we broadened to mero, and turned around pretty rapidly thereafter.  P: -mero to complete abx course   Acute hypoxic and hypercarbic resp failure Hx OSA, non on CPAP   Multifocal PNA Pulm edema -RVP covid flu neg, trach asp no orgs but was obtained after a few days of abx   P: -Noct bipap + PRN. Will need set up w sleep/pulm outpt   -IS, mobility, pulm hygiene  -O2 sat goal >92   Afib/flutter  Acute (?on chronic) HFrEF -- septic cardiomyopathy  pHTN (group II and III)  HTN  -initial c/f myocarditis less favored  -repeat ECHO EF 40-45  P -amio  -bival (changed 2/7 for thrombocytopenia c/r HIT) -optimize lytes  -NPPV compliance and volume  optimization   Thrombocytopenia, improving  -HIT ab pending -if HIT + will need BLE US  r/o DVT  -bival   AKI, likely ATN NAGMA  Hyper/hypophos  P: -off CRRT 2/8. Follow renal indices, watch for UOP. Further RRT plans per nephro  -replace lytes PRN   Elevated LFTs Mild hyperbilirubinemia Mild hyperammonemia  -acute hep neg. Shock liver most favored. Possible congestive hepatopathy component but if septic CM less favored   P: -PRN LFTs, ammonia   DM2  P: -SSI   Code status discussion / GOC  P: -spoke w wife 2/3 who verified Full code/ full scope of practice   L/T/D -PICC - cont  -temp HD - cont  -foley - d/w neprho, cont for now   Best Practice (right click and Reselect all SmartList Selections daily)   Diet/type: Regular consistency (see orders) DVT prophylaxis bival  Pressure ulcer(s): pressure ulcer assessment deferred  GI prophylaxis: PPI Lines:  Central line, Dialysis Catheter, and yes and it is still needed Foley:  Yes, and it is still needed Code Status:  full code Last date of multidisciplinary goals of care discussion [wife updated 2/6  Labs   CBC: Recent Labs  Lab 05/10/23 0455 05/11/23 0918 05/12/23 0506  05/12/23 1544 05/13/23 0414 05/13/23 1512 05/14/23 0410  WBC 7.4 7.9 9.6  --  12.3*  --  13.6*  HGB 15.2 15.8 16.0 17.7* 16.8 18.7*  18.7* 15.2  HCT 47.6 49.4 51.1 52.0 52.2* 55.0*  55.0* 47.3  MCV 83.5 83.2 83.1  --  81.8  --  81.7  PLT 140* 128* 127*  --  99*  --  135*    Basic Metabolic Panel: Recent Labs  Lab 05/10/23 0455 05/10/23 0725 05/11/23 0507 05/11/23 1634 05/12/23 0506 05/12/23 1544 05/12/23 1628 05/13/23 0414 05/13/23 1512 05/13/23 1643 05/13/23 1952 05/14/23 0410  NA 136   < > 132*   < > 134*   < > 133* 134* 139  139 133*  --  135  K 5.2*   < > 4.8   < > 3.9   < > 4.3 4.2 4.2  4.2 3.8  --  4.3  CL 102   < > 99   < > 101  --  100 101  --  99  --  99  CO2 19*   < > 20*   < > 19*  --  19* 20*  --  16*  --  20*   GLUCOSE 182*   < > 210*   < > 226*  --  270* 173*  --  359*  --  188*  BUN 29*   < > 31*   < > 31*  --  38* 30*  --  30*  --  46*  CREATININE 2.96*   < > 2.25*   < > 2.46*  --  2.49* 2.34*  --  2.53*  --  4.05*  CALCIUM  8.3*   < > 8.5*   < > 8.3*  --  8.2* 8.3*  --  7.2*  --  8.2*  MG 2.5*  --  2.6*  --  2.5*  --   --  2.6*  --   --   --  2.6*  PHOS 3.9   < > 3.5   < > 1.7*  --  1.6* 1.6*  --  4.8* 5.2* 5.3*   < > = values in this interval not displayed.   GFR: Estimated Creatinine Clearance: 27.7 mL/min (A) (by C-G formula based on SCr of 4.05 mg/dL (H)). Recent Labs  Lab 05/07/23 1601 05/08/23 0508 05/09/23 0514 05/09/23 0515 05/09/23 1210 05/10/23 0455 05/11/23 0918 05/12/23 0506 05/13/23 0414 05/14/23 0410  PROCALCITON  --  1.09  --   --   --   --   --   --   --   --   WBC  --  13.2*  --    < >  --    < > 7.9 9.6 12.3* 13.6*  LATICACIDVEN 1.4 1.7 2.8*  --  1.4  --   --   --   --   --    < > = values in this interval not displayed.    Liver Function Tests: Recent Labs  Lab 05/09/23 0514 05/09/23 1953 05/10/23 0455 05/10/23 1553 05/12/23 0506 05/12/23 1628 05/13/23 0414 05/13/23 1643 05/14/23 0410  AST 1,458*  --  982*  --  261*  --  113*  --  55*  ALT 686*  --  564*  --  307*  --  206*  --  113*  ALKPHOS 81  --  84  --  116  --  105  --  97  BILITOT 2.9*  --  3.0*  --  3.3*  --  2.8*  --  2.5*  PROT 6.9  --  6.4*  --  7.0  --  7.1  --  6.4*  ALBUMIN  2.9*   < > 2.4*  2.4*   < > 2.5*  2.6* 2.5* 2.6*  2.6* 2.1* 2.3*   < > = values in this interval not displayed.   Recent Labs  Lab 05/08/23 0508  LIPASE 24   Recent Labs  Lab 05/08/23 1600 05/09/23 0953 05/10/23 0455 05/13/23 0806  AMMONIA 40* <10 32 41*    ABG    Component Value Date/Time   PHART 7.421 05/12/2023 1544   PCO2ART 28.8 (L) 05/12/2023 1544   PO2ART 61 (L) 05/12/2023 1544   HCO3 21.9 05/13/2023 1512   HCO3 22.5 05/13/2023 1512   TCO2 23 05/13/2023 1512   TCO2 24 05/13/2023 1512    ACIDBASEDEF 2.0 05/13/2023 1512   ACIDBASEDEF 1.0 05/13/2023 1512   O2SAT 73 05/13/2023 1512   O2SAT 75 05/13/2023 1512     Coagulation Profile: Recent Labs  Lab 05/08/23 1600 05/12/23 0506 05/14/23 0410  INR 2.2* 1.1 2.3*    Cardiac Enzymes: Recent Labs  Lab 05/08/23 0508  CKTOTAL 320  CKMB 3.7    HbA1C: Hemoglobin A1C  Date/Time Value Ref Range Status  11/05/2022 01:58 PM 9.1 (A) 4.0 - 5.6 % Final  08/05/2022 01:54 PM 8.1 (A) 4.0 - 5.6 % Final   HbA1c, POC (prediabetic range)  Date/Time Value Ref Range Status  11/05/2022 01:58 PM 9.1 (A) 5.7 - 6.4 % Final  08/05/2022 01:54 PM 8.1 (A) 5.7 - 6.4 % Final   HbA1c, POC (controlled diabetic range)  Date/Time Value Ref Range Status  11/05/2022 01:58 PM 9.1 (A) 0.0 - 7.0 % Final  08/05/2022 01:54 PM 8.1 (A) 0.0 - 7.0 % Final   HbA1c POC (<> result, manual entry)  Date/Time Value Ref Range Status  11/05/2022 01:58 PM 9.1 4.0 - 5.6 % Final  08/05/2022 01:54 PM 8.1 4.0 - 5.6 % Final   Hgb A1c MFr Bld  Date/Time Value Ref Range Status  05/05/2023 03:46 AM 10.2 (H) 4.8 - 5.6 % Final    Comment:    (NOTE) Pre diabetes:          5.7%-6.4%  Diabetes:              >6.4%  Glycemic control for   <7.0% adults with diabetes     CBG: Recent Labs  Lab 05/13/23 1951 05/14/23 0005 05/14/23 0355 05/14/23 0813 05/14/23 1123  GLUCAP 204* 196* 184* 220* 211*   CRITICAL CARE Performed by: Ronnald FORBES Gave   Total critical care time: 37 minutes  Critical care time was exclusive of separately billable procedures and treating other patients. Critical care was necessary to treat or prevent imminent or life-threatening deterioration.  Critical care was time spent personally by me on the following activities: development of treatment plan with patient and/or surrogate as well as nursing, discussions with consultants, evaluation of patient's response to treatment, examination of patient, obtaining history from patient or  surrogate, ordering and performing treatments and interventions, ordering and review of laboratory studies, ordering and review of radiographic studies, pulse oximetry and re-evaluation of patient's condition.  Ronnald Gave MSN, AGACNP-BC [facility] Amion for pager 05/14/2023, 12:02 PM

## 2023-05-15 DIAGNOSIS — I483 Typical atrial flutter: Secondary | ICD-10-CM | POA: Diagnosis not present

## 2023-05-15 DIAGNOSIS — I5021 Acute systolic (congestive) heart failure: Secondary | ICD-10-CM | POA: Diagnosis not present

## 2023-05-15 DIAGNOSIS — J9601 Acute respiratory failure with hypoxia: Secondary | ICD-10-CM | POA: Diagnosis not present

## 2023-05-15 DIAGNOSIS — N179 Acute kidney failure, unspecified: Secondary | ICD-10-CM | POA: Diagnosis not present

## 2023-05-15 DIAGNOSIS — E1165 Type 2 diabetes mellitus with hyperglycemia: Secondary | ICD-10-CM | POA: Diagnosis not present

## 2023-05-15 LAB — GLUCOSE, CAPILLARY
Glucose-Capillary: 179 mg/dL — ABNORMAL HIGH (ref 70–99)
Glucose-Capillary: 136 mg/dL — ABNORMAL HIGH (ref 70–99)
Glucose-Capillary: 229 mg/dL — ABNORMAL HIGH (ref 70–99)
Glucose-Capillary: 302 mg/dL — ABNORMAL HIGH (ref 70–99)
Glucose-Capillary: 337 mg/dL — ABNORMAL HIGH (ref 70–99)

## 2023-05-15 LAB — RENAL FUNCTION PANEL
Albumin: 2 g/dL — ABNORMAL LOW (ref 3.5–5.0)
Anion gap: 16 — ABNORMAL HIGH (ref 5–15)
BUN: 72 mg/dL — ABNORMAL HIGH (ref 6–20)
CO2: 19 mmol/L — ABNORMAL LOW (ref 22–32)
Calcium: 8 mg/dL — ABNORMAL LOW (ref 8.9–10.3)
Chloride: 97 mmol/L — ABNORMAL LOW (ref 98–111)
Creatinine, Ser: 5.47 mg/dL — ABNORMAL HIGH (ref 0.61–1.24)
GFR, Estimated: 11 mL/min — ABNORMAL LOW (ref >60)
Glucose, Bld: 179 mg/dL — ABNORMAL HIGH (ref 70–99)
Phosphorus: 4.1 mg/dL (ref 2.5–4.6)
Potassium: 4.3 mmol/L (ref 3.5–5.1)
Sodium: 132 mmol/L — ABNORMAL LOW (ref 135–145)

## 2023-05-15 LAB — PROTIME-INR
INR: 1.8 — ABNORMAL HIGH (ref 0.8–1.2)
Prothrombin Time: 21.2 s — ABNORMAL HIGH (ref 11.4–15.2)

## 2023-05-15 LAB — CBC
HCT: 43.4 % (ref 39.0–52.0)
Hemoglobin: 14.2 g/dL (ref 13.0–17.0)
MCH: 26.1 pg (ref 26.0–34.0)
MCHC: 32.7 g/dL (ref 30.0–36.0)
MCV: 79.8 fL — ABNORMAL LOW (ref 80.0–100.0)
Platelets: 165 10*3/uL (ref 150–400)
RBC: 5.44 MIL/uL (ref 4.22–5.81)
RDW: 17.8 % — ABNORMAL HIGH (ref 11.5–15.5)
WBC: 12.9 10*3/uL — ABNORMAL HIGH (ref 4.0–10.5)
nRBC: 0 % (ref 0.0–0.2)

## 2023-05-15 LAB — COOXEMETRY PANEL
Carboxyhemoglobin: 0.9 % (ref 0.5–1.5)
Methemoglobin: <0.7 % (ref 0.0–1.5)
O2 Saturation: 66.1 %
Total hemoglobin: 14.8 g/dL (ref 12.0–16.0)

## 2023-05-15 LAB — APTT: aPTT: 79 s — ABNORMAL HIGH (ref 24–36)

## 2023-05-15 LAB — MAGNESIUM: Magnesium: 2.5 mg/dL — ABNORMAL HIGH (ref 1.7–2.4)

## 2023-05-15 MED ORDER — FUROSEMIDE 10 MG/ML IJ SOLN
80.0000 mg | Freq: Once | INTRAMUSCULAR | Status: AC
  Administered 2023-05-15: 80 mg via INTRAVENOUS
  Filled 2023-05-15: qty 8

## 2023-05-15 MED ORDER — INSULIN ASPART 100 UNIT/ML IJ SOLN
0.0000 [IU] | Freq: Three times a day (TID) | INTRAMUSCULAR | Status: DC
  Administered 2023-05-15: 11 [IU] via SUBCUTANEOUS
  Administered 2023-05-16: 3 [IU] via SUBCUTANEOUS
  Administered 2023-05-16: 8 [IU] via SUBCUTANEOUS
  Administered 2023-05-16: 5 [IU] via SUBCUTANEOUS
  Administered 2023-05-17: 8 [IU] via SUBCUTANEOUS
  Administered 2023-05-17: 5 [IU] via SUBCUTANEOUS

## 2023-05-15 MED ORDER — INSULIN ASPART 100 UNIT/ML IJ SOLN
0.0000 [IU] | Freq: Every day | INTRAMUSCULAR | Status: DC
  Administered 2023-05-15: 4 [IU] via SUBCUTANEOUS
  Administered 2023-05-16: 2 [IU] via SUBCUTANEOUS
  Administered 2023-05-17: 5 [IU] via SUBCUTANEOUS
  Administered 2023-05-18: 4 [IU] via SUBCUTANEOUS
  Administered 2023-05-19: 3 [IU] via SUBCUTANEOUS

## 2023-05-15 MED ORDER — AMIODARONE HCL 200 MG PO TABS
400.0000 mg | ORAL_TABLET | Freq: Two times a day (BID) | ORAL | Status: DC
  Administered 2023-05-15 – 2023-05-18 (×7): 400 mg via ORAL
  Filled 2023-05-15 (×7): qty 2

## 2023-05-15 MED ORDER — ORAL CARE MOUTH RINSE: 15.0000 mL | OROMUCOSAL | Status: DC | PRN

## 2023-05-15 MED ORDER — INSULIN ASPART 100 UNIT/ML IJ SOLN
2.0000 [IU] | Freq: Three times a day (TID) | INTRAMUSCULAR | Status: DC
  Administered 2023-05-15 – 2023-05-16 (×4): 2 [IU] via SUBCUTANEOUS

## 2023-05-15 MED ORDER — ALBUMIN HUMAN 25 % IV SOLN
25.0000 g | Freq: Once | INTRAVENOUS | Status: AC
  Administered 2023-05-15: 25 g via INTRAVENOUS
  Filled 2023-05-15: qty 100

## 2023-05-15 MED ORDER — GLUCERNA SHAKE PO LIQD
237.0000 mL | Freq: Three times a day (TID) | ORAL | Status: DC
  Administered 2023-05-16 (×3): 237 mL via ORAL

## 2023-05-15 NOTE — Progress Notes (Signed)
 Patient ID: Dylan Owen, male   DOB: 18-Dec-1965, 58 y.o.   MRN: [id number]  [facility] Progress Note   Assessment/ Plan:   1. Acute kidney Injury: With essentially normal renal function at baseline (creatinine 1.1) and suffered ATN in the setting of shock.  He remains anuric and without any clear evidence of renal recovery but no acute indicators to prompt need to restart renal replacement therapy.  He is slightly hypovolemic but with soft blood pressures aggressive diuresis; will challenge with a combination of albumin  and furosemide  today. 2.  Mixed cardiogenic/septic shock: Clinically suspected to be associated with viral myocarditis/acute CHF exacerbation.  Earlier tolerated significant ultrafiltration with CRRT and overnight fluid balance 1.7 L with anuric (45 cc) urine output. 3.  Shock liver: Transaminases appear to be trending down with hemodynamic support.  Hemodynamically improving. 4.  Acute hypoxic/hypercarbic respiratory failure: Tolerating supplemental oxygen via nasal cannula after earlier requiring intubation/ventilator support and intermittent NIPPV.  Subjective:   Informs me that he sat in recliner yesterday motivated to get better so that he can support his son who is going to have surgery in a couple of weeks.   Objective:   BP (!) 94/55   Pulse 77   Temp 97.6 F (36.4 C) (Axillary)   Resp 12   Ht 6' 1 (1.854 m)   Wt 122.8 kg   SpO2 100%   BMI 35.72 kg/m   Intake/Output Summary (Last 24 hours) at 05/15/2023 0801 Last data filed at 05/15/2023 0700 Gross per 24 hour  Intake 1506.11 ml  Output 45 ml  Net 1461.11 ml   Weight change: -4.2 kg  Physical Exam: Gen: Awake and alert, eating breakfast.  CVS: Pulse regular rhythm, normal rate, S1 and S2 normal Resp: Anteriorly clear to auscultation, no rales/rhonchi Abd: Soft, obese, nontender, bowel sounds normal Ext: 1+ bilateral lower extremity edema with skin wrinkling, trace-1+ upper extremity edema  with some dependent edema  Imaging: CARDIAC CATHETERIZATION Result Date: 05/13/2023 HEMODYNAMICS: RA:   6 mmHg (mean) RV:   44/2-7 mmHg PA:   39/26 mmHg (33 mean) PCWP:  10 mmHg (mean)    Estimated Fick CO/CI   5.78 L/min, 2.3 L/min/m2 Thermodilution CO/CI   5 L/min, 2 L/min/m2     TPG    23  mmHg     PVR     4.6-4 Wood Units PAPi      2.2 IMPRESSION: Normal right and left heart filling pressures. 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 Moderately reduced cardiac output/index Aditya Sabharwal 3:19 PM   DG CHEST PORT 1 VIEW Result Date: 05/13/2023 CLINICAL DATA:  Respiratory failure. EXAM: PORTABLE CHEST 1 VIEW COMPARISON:  May 09, 2023. FINDINGS: Stable cardiomediastinal silhouette. Endotracheal and nasogastric tubes have been removed. Stable left internal jugular catheter and right-sided PICC line. Stable bibasilar atelectasis or infiltrates. Bony thorax is unremarkable. IMPRESSION: Stable bibasilar atelectasis or infiltrates. Electronically Signed   By: Lynwood Landy Raddle M.D.   On: 05/13/2023 12:18    Labs: BMET Recent Labs  Lab 05/12/23 0506 05/12/23 1544 05/12/23 1628 05/13/23 0414 05/13/23 1512 05/13/23 1643 05/13/23 1952 05/14/23 0410 05/14/23 1627 05/15/23 0421  NA 134*   < > 133* 134* 139  139 133*  --  135 128* 132*  K 3.9   < > 4.3 4.2 4.2  4.2 3.8  --  4.3 4.7 4.3  CL 101  --  100 101  --  99  --  99 92* 97*  CO2 19*  --  19* 20*  --  16*  --  20* 18* 19*  GLUCOSE 226*  --  270* 173*  --  359*  --  188* 405* 179*  BUN 31*  --  38* 30*  --  30*  --  46* 62* 72*  CREATININE 2.46*  --  2.49* 2.34*  --  2.53*  --  4.05* 4.68* 5.47*  CALCIUM  8.3*  --  8.2* 8.3*  --  7.2*  --  8.2* 7.5* 8.0*  PHOS 1.7*  --  1.6* 1.6*  --  4.8* 5.2* 5.3* 3.4 4.1   < > = values in this interval not displayed.   CBC Recent Labs  Lab 05/12/23 0506 05/12/23 1544 05/13/23 0414 05/13/23 1512 05/14/23 0410 05/15/23 0710  WBC 9.6  --  12.3*  --   13.6* 12.9*  HGB 16.0   < > 16.8 18.7*  18.7* 15.2 14.2  HCT 51.1   < > 52.2* 55.0*  55.0* 47.3 43.4  MCV 83.1  --  81.8  --  81.7 79.8*  PLT 127*  --  99*  --  135* 165   < > = values in this interval not displayed.    Medications:     aspirin  EC  81 mg Oral Daily   bacitracin    Topical BID   Chlorhexidine  Gluconate Cloth  6 each Topical Daily   feeding supplement  237 mL Oral TID BM   insulin  aspart  0-15 Units Subcutaneous Q4H   insulin  glargine-yfgn  10 Units Subcutaneous QHS   lidocaine   1 patch Transdermal Q24H   methocarbamol   500 mg Oral BID   multivitamin  1 tablet Oral QHS   mouth rinse  15 mL Mouth Rinse 4 times per day   oxyCODONE   10 mg Oral Q6H   pantoprazole  (PROTONIX ) IV  40 mg Intravenous QHS    Gordy Blanch, MD 05/15/2023, 8:01 AM

## 2023-05-15 NOTE — Progress Notes (Signed)
 Advanced Heart Failure Rounding Note  HF Cardiologist: Dr. Zenaida   Chief Complaint: Mixed Cardiogenic and Septic Shock    Patient Profile   58 y.o. male with a PMH of obesity, HTN, HLD, DM2, and OSA admitted w/ mixed septic and cardiogenic shock>>progression to anuric renal failure requiring CRRT.  Interval Hx:    2/3 worsening septic shock>> fever up to 101, increase LA, escalation of pressors, delirium and worsening respiratory failure>>intubated. Milrinone  stopped due to frequent ectopy. Abx broadened. Vanc added to meropenum. CT of C/A/P w/ multifocal PNA and s/o cystitis. 2/4 Marked improvement. Resolution of fever and delirium. Weaned off pressors, became hypertensive and started on cleviprex  gtt  2/5 Extubated, weaned off Cleviprex , Cortrak placed (post pyloric) 2/8: RHC, DCCV to NSR.    Subjective:    -Sinus tachycardia today -Up in chair today -sCr 5.5 today, up from 4.6.  -Anuric  Objective:   Weight Range: 122.8 kg Body mass index is 35.72 kg/m.   Vital Signs:   Temp:  [97.3 F (36.3 C)-98.1 F (36.7 C)] 97.6 F (36.4 C) (02/09 0715) Pulse Rate:  [72-189] 87 (02/09 1000) Resp:  [12-25] 18 (02/09 1000) BP: (81-123)/(46-107) 85/50 (02/09 1000) SpO2:  [87 %-100 %] 94 % (02/09 1000) FiO2 (%):  [60 %] 60 % (02/09 0400) Weight:  [122.8 kg] 122.8 kg (02/09 0434) Last BM Date : 05/13/23  Weight change: Filed Weights   05/13/23 1030 05/14/23 0500 05/15/23 0434  Weight: 127 kg 123.1 kg 122.8 kg    Intake/Output:   Intake/Output Summary (Last 24 hours) at 05/15/2023 1106 Last data filed at 05/15/2023 1000 Gross per 24 hour  Intake 1639.18 ml  Output 70 ml  Net 1569.18 ml      Physical Exam   General: awake/alert; sitting up in chair.  Neck: L internal jugular HD catheter in place Cor: regular rate; tachycardic Lungs: coarse Abdomen: obese, non-distended Extremities: trace edema Neuro: alert, no FND.   Labs    CBC Recent Labs     05/14/23 0410 05/15/23 0710  WBC 13.6* 12.9*  HGB 15.2 14.2  HCT 47.3 43.4  MCV 81.7 79.8*  PLT 135* 165   Basic Metabolic Panel Recent Labs    97/91/74 0410 05/14/23 1627 05/15/23 0421  NA 135 128* 132*  K 4.3 4.7 4.3  CL 99 92* 97*  CO2 20* 18* 19*  GLUCOSE 188* 405* 179*  BUN 46* 62* 72*  CREATININE 4.05* 4.68* 5.47*  CALCIUM  8.2* 7.5* 8.0*  MG 2.6*  --  2.5*  PHOS 5.3* 3.4 4.1   Liver Function Tests Recent Labs    05/13/23 0414 05/13/23 1643 05/14/23 0410 05/14/23 1627 05/15/23 0421  AST 113*  --  55*  --   --   ALT 206*  --  113*  --   --   ALKPHOS 105  --  97  --   --   BILITOT 2.8*  --  2.5*  --   --   PROT 7.1  --  6.4*  --   --   ALBUMIN  2.6*  2.6*   < > 2.3* 2.0* 2.0*   < > = values in this interval not displayed.   No results for input(s): LIPASE, AMYLASE in the last 72 hours.  Cardiac Enzymes No results for input(s): CKTOTAL, CKMB, CKMBINDEX, TROPONINI in the last 72 hours.   BNP: BNP (last 3 results) Recent Labs    05/04/23 1645  BNP 1,275.2*     Medications:  Scheduled Medications:  amiodarone   400 mg Oral BID   aspirin  EC  81 mg Oral Daily   bacitracin    Topical BID   Chlorhexidine  Gluconate Cloth  6 each Topical Daily   feeding supplement  237 mL Oral TID BM   furosemide   80 mg Intravenous Once   insulin  aspart  0-15 Units Subcutaneous Q4H   insulin  glargine-yfgn  10 Units Subcutaneous QHS   lidocaine   1 patch Transdermal Q24H   methocarbamol   500 mg Oral BID   multivitamin  1 tablet Oral QHS   oxyCODONE   10 mg Oral Q6H   pantoprazole  (PROTONIX ) IV  40 mg Intravenous QHS    Infusions:  bivalirudin  (ANGIOMAX ) 250 mg in sodium chloride  0.9 % 500 mL (0.5 mg/mL) infusion 0.05 mg/kg/hr (05/15/23 1000)    PRN Medications: acetaminophen  (TYLENOL ) oral liquid 160 mg/5 mL, albuterol , HYDROmorphone  (DILAUDID ) injection, naLOXone  (NARCAN )  injection, nitroGLYCERIN , ondansetron  (ZOFRAN ) IV, mouth  rinse    Assessment/Plan   Mixed cardiogenic/septic shock: Presented febrile, cold on exam, with worsening blood pressure. TEE w/ moderately reduced LVEF. Initially on Milrinone . 2/3 developed worsening septic shock w/ fever, increase in lactic acid, delirium, and worsening respiratory failure>>intubated. Milrinone  stopped due to frequent ectopy. Vanc added to meropenum. CT of C/A/P w/ multifocal PNA and s/o cystitis. UA dirty, Ucx multiple species, Respiratory Cx no growth, Bcx NGTD -Right heart cath yesterday with cardiac index of 2 to 2.3 L/min/m while in atrial flutter. -CVP 3-4 today.  -WBC ct improving. -COOX pending.   Acute systolic CHF - Suspect stress CM in setting of sepsis - Echo 01/30: Unable to estimate EF d/t image quality, RWMA - Limited echo 02/06: EF 40-45%, RWMA, RV reduced -Right heart catheterization with cardiac index of 2-2.3 while in atrial flutter.  I suspect his LVEF will improve now that he is out of septic shock. -Hypovolemic on exam today  Acute hypoxic and hypercarbic respiratory failure Hx OSA - Secondary to PNA and acute heart failure. Intubated 2/3 - extubated 2/5 -Continue nightly BiPAP.  Atrial flutter with RVR -NSR today.  -Transition to amiodarone  400mg  BID  Thrombocytopenia - Platelets 237 on 02/03>>99K today - Started heparin  02/03. Switch to bival. Check HIT panel -Currently on bivalirudin .  Can transition to DOAC after deciding on tunneled dialysis catheter. -Plt count now normal at 165  Hypertension  - holding hydralazine   AKI: suspect ATN 2/2 mixed shock. Remains anuric.  -Remains anuric -CVP of 2-4 -Receiving albumin  and lasix  today to test renal function; likely heading towards dialysis. Nephrology following.   Shock liver: - 2/2 mixed shock/hepatic congestion from CHF  - Improving.   Diabetes:  poorly controlled,  A1c 10.2   - insulin  management per CCM   Acute metabolic encephalopathy -Solved  Deconditioning -  appears extremely weak - Aggressive PT/OT  Discussed with CCM & pharmD  CRITICAL CARE Performed by: Ria Commander   Total critical care time: 35 minutes  Critical care time was exclusive of separately billable procedures and treating other patients.  Critical care was necessary to treat or prevent imminent or life-threatening deterioration.  Critical care was time spent personally by me on the following activities: development of treatment plan with patient and/or surrogate as well as nursing, discussions with consultants, evaluation of patient's response to treatment, examination of patient, obtaining history from patient or surrogate, ordering and performing treatments and interventions, ordering and review of laboratory studies, ordering and review of radiographic studies, pulse oximetry and re-evaluation of patient's condition.  Length  of Stay: [address], DO  05/15/2023, 11:06 AM  Advanced Heart Failure Team Pager [phone] (M-F; 7a - 5p)  Please contact [facility] for night-coverage after hours (5p -7a ) and weekends on [email or web address]

## 2023-05-15 NOTE — Progress Notes (Signed)
 NAME:  Dylan Owen, MRN:  [id number], DOB:  December 07, 1965, LOS: 10 ADMISSION DATE:  05/04/2023, CONSULTATION DATE:  05/07/23 REFERRING MD:  Nilda, CHIEF COMPLAINT:  resp failure shock    History of Present Illness:  58 year old male with significant obesity, hypertension, hyperlipidemia, type 2 diabetes, sleep apnea, chronic back pain, depression, acid reflux.  Also depression significant home medications include Lasix , hydrochlorothiazide .  Lisinopril , metoprolol , nitroglycerin  but also Robaxin  and MS Contin  30 mg twice daily along with Repatha .  And Effexor .  Along with insulin  for diabetes.  She he walks around with a cane at baseline.  He also admits to medication noncompliance.  And 10 pound weight gain he is not on oxygen at home.  His hemoglobin A1c was 10.2 at admission consistent with very poor compliance.   Admitted on 05/04/2023 with complaints of cough, sore throat, dizziness and bodyaches for 1 week.  Also included chest tightness headaches chills diaphoresis.  At the time of arrival to the ED also had dyspnea on exertion.  At admission BNP was 1275.  Respiratory virus panel was negative.  He was started on Lasix  60 twice daily and his home lisinopril .  He was initially hypertensive and with this management blood pressure improved.  Echocardiogram showed low ejection fraction approximately 25% although poor acoustic windows.  Chest x-ray suggestive of venous congestion.  Cardiology consultation 05/06/2023 noted net -1.6 L since admission and patient was given history of increased frequency of urination.  Diagnose of viral myocarditis was made and left a right heart catheterization was considered.  Troponin barely elevated and consistent with viral myocarditis.   Postadmission with diuresis as of 05/06/2023 creatinine rose up to 1.57 mg percent [baseline 1 mg percent].  At this point lisinopril  was held.  811/31/25 developed hypoglycemia requiring infusion of dextrose  and transferred to the  intensive care unit.  Post transfer to the intensive care unit he developed acute fever and also BiPAP dependency.  Along with worsening renal failure further to creatinine of 2.83 mg percent along with persistent lactic acidosis around 3.  And mild intermittent delirium.  Overnight diaphoresis reported but despite all this nursing consistently reports that his periphery is extremely cold.   Cardiology has been reconsulted.  Critical care medicine consulted.  Pertinent  Medical History  B12 deficiency, Blood transfusion without reported diagnosis, Chronic low back pain, Depression, Diabetes mellitus with complication (HCC), GERD (gastroesophageal reflux disease), Heart murmur, History of adenomatous polyp of colon (05/22/2021), Hypercholesterolemia, Hypertension, OSA (obstructive sleep apnea), Peripheral neuropathy, Persistent asthma, Seizures (HCC), Stable angina (HCC), Statin intolerance, and Subclinical hypothyroidism (06/2017).    reports that he has never smoked. His smokeless tobacco use includes chew.  Significant Hospital Events: Including procedures, antibiotic start and stop dates in addition to other pertinent events   1/29 admit to [facility], diuresis 1/30 ECHO w LVEF 25% 1/31 cards consult, c/f viral myocarditis  2/1 PCCM consult txf to [facility], Adv HF consult  2/2 for HD cath placement and CRRT. Incr D10 for persistent hypogly   2/3 milrinone  stopped. Weaning NE; intubated due to being on bipap for a few days and needing to go to CT  2/5 extubated. Cleviprex  for hypertension   2/6 CT H for anisocoria, no acute findings. Off clevi. Adding PRN metop. CRRT UF decr to 150/hr.  2/7 on BiPAP. RHC and DCCV  2/8 off crrt  2/9 albumin  + lasix  challenge. HIT neg    Interim History / Subjective:  Soft pressures early AM while he was sleeping  Wore his BiPAP much of the night!   Hit neg   Objective   Blood pressure 118/89, pulse 99, temperature 97.6 F (36.4 C), temperature source Axillary,  resp. rate 19, height 6' 1 (1.854 m), weight 122.8 kg, SpO2 (!) 87%.    Vent Mode: PCV;BIPAP FiO2 (%):  [60 %] 60 % Set Rate:  [14 bmp] 14 bmp PEEP:  [5 cmH20] 5 cmH20   Intake/Output Summary (Last 24 hours) at 05/15/2023 0946 Last data filed at 05/15/2023 0800 Gross per 24 hour  Intake 1743.77 ml  Output 45 ml  Net 1698.77 ml   Filed Weights   05/13/23 1030 05/14/23 0500 05/15/23 0434  Weight: 127 kg 123.1 kg 122.8 kg    Examination: General:  Chronically and acutely ill middle aged M NAD  HEENT: NCAT. Big beard. Anicteric sclera  Neuro: AAOx3  CV: tachycardic, regular  PULM:  diminished  GI: soft round ndnt  GU: foley, scant dark urine  Extremities: RUE PICC. Improving BLE edema  Skin: scattered ecchymosis   Resolved Hospital Problem list   Hypomag   Assessment & Plan:    Acute metabolic encephalopathy, improving  P: -Delirium precautions -busy activities -- folding towels paper airplanes coloring busy board etc  -supportive care as below   Septic shock -- UTI, multifocal MSSA PNA -- improved shock  -there was initial concern for cardiogenic shock w HFrEF, possible viral myocarditis but coox et al have been assuring against.  -course has been most c/w septic shock, compounded by septic cardiomyopathy + pHTN, sequelae of multisystem organ dysfxn  -cx have not identified an org, however clinically he continued to decline until we broadened to mero, and turned around pretty rapidly thereafter.  P: -completed 7d mero  -follow white count fever curve   Acute hypoxic and hypercarbic resp failure MSSA PNA Hx OSA non on CPAP  P: -Noct bipap + PRN. Will need set up w sleep/pulm outpt   -IS, mobility, pulm hygiene  -O2 sat goal >92   New Afib/flutter s/p DCCV 2/7 -- now sinus  Acute (possibly on chronic) HFrEF-- septic cardiomyopathy  pHTN (groud II and III) -initial c/f myocarditis but less favored as course/workup went on -repeat ECHO EF 40-45. RHC with normal R  and L filling pressures, elevated PA mean and PVR  P -amio -- anticipate changing to PO soon  -bival (changed 2/7 for thrombocytopenia c/r HIT) -optimize lytes  -NPPV compliance and volume optimization   Thrombocytopenia, improving  HIT ruled out  -HIT ab 0.085, WNL  -continuing systemic AC gtt (currently Bival, fine to stay on this w neg HIT) for now while we are watching for renal recovery. If renal recovery, switch to DOAC. If it looks like he may need a tunneled HD cath then would just stay on Bival.   AKI likely ATN AGMA P: -for lasix  + albumin  challenge 2/9  -replace lytes PRN   Elevated LFTs, improving mild hyperbilirubinemia mild hyperammonemia  -acute hep neg. Shock liver most favored. Possible congestive hepatopathy component but if septic CM less favored   P: -PRN LFTs, ammonia    DM2 P: -SSI   Code status discussion / GOC  P: -spoke w wife 2/3 who verified Full code/ full scope of practice   L/T/D -PICC - cont  -temp HD - cont  -foley - d/w neprho, cont for now   Best Practice (right click and Reselect all SmartList Selections daily)   Diet/type: Regular consistency (see orders) DVT prophylaxis bival  Pressure  ulcer(s): pressure ulcer assessment deferred  GI prophylaxis: PPI Lines:  Central line, Dialysis Catheter, and yes and it is still needed Foley:  Yes, and it is still needed Code Status:  full code Last date of multidisciplinary goals of care discussion [wife updated 2/6  Labs   CBC: Recent Labs  Lab 05/11/23 0918 05/12/23 0506 05/12/23 1544 05/13/23 0414 05/13/23 1512 05/14/23 0410 05/15/23 0710  WBC 7.9 9.6  --  12.3*  --  13.6* 12.9*  HGB 15.8 16.0 17.7* 16.8 18.7*  18.7* 15.2 14.2  HCT 49.4 51.1 52.0 52.2* 55.0*  55.0* 47.3 43.4  MCV 83.2 83.1  --  81.8  --  81.7 79.8*  PLT 128* 127*  --  99*  --  135* 165    Basic Metabolic Panel: Recent Labs  Lab 05/11/23 0507 05/11/23 1634 05/12/23 0506 05/12/23 1544 05/13/23 0414  05/13/23 1512 05/13/23 1643 05/13/23 1952 05/14/23 0410 05/14/23 1627 05/15/23 0421  NA 132*   < > 134*   < > 134* 139  139 133*  --  135 128* 132*  K 4.8   < > 3.9   < > 4.2 4.2  4.2 3.8  --  4.3 4.7 4.3  CL 99   < > 101   < > 101  --  99  --  99 92* 97*  CO2 20*   < > 19*   < > 20*  --  16*  --  20* 18* 19*  GLUCOSE 210*   < > 226*   < > 173*  --  359*  --  188* 405* 179*  BUN 31*   < > 31*   < > 30*  --  30*  --  46* 62* 72*  CREATININE 2.25*   < > 2.46*   < > 2.34*  --  2.53*  --  4.05* 4.68* 5.47*  CALCIUM  8.5*   < > 8.3*   < > 8.3*  --  7.2*  --  8.2* 7.5* 8.0*  MG 2.6*  --  2.5*  --  2.6*  --   --   --  2.6*  --  2.5*  PHOS 3.5   < > 1.7*   < > 1.6*  --  4.8* 5.2* 5.3* 3.4 4.1   < > = values in this interval not displayed.   GFR: Estimated Creatinine Clearance: 20.5 mL/min (A) (by C-G formula based on SCr of 5.47 mg/dL (H)). Recent Labs  Lab 05/09/23 0514 05/09/23 0515 05/09/23 1210 05/10/23 0455 05/12/23 0506 05/13/23 0414 05/14/23 0410 05/15/23 0710  WBC  --    < >  --    < > 9.6 12.3* 13.6* 12.9*  LATICACIDVEN 2.8*  --  1.4  --   --   --   --   --    < > = values in this interval not displayed.    Liver Function Tests: Recent Labs  Lab 05/09/23 0514 05/09/23 1953 05/10/23 0455 05/10/23 1553 05/12/23 0506 05/12/23 1628 05/13/23 0414 05/13/23 1643 05/14/23 0410 05/14/23 1627 05/15/23 0421  AST 1,458*  --  982*  --  261*  --  113*  --  55*  --   --   ALT 686*  --  564*  --  307*  --  206*  --  113*  --   --   ALKPHOS 81  --  84  --  116  --  105  --  97  --   --  BILITOT 2.9*  --  3.0*  --  3.3*  --  2.8*  --  2.5*  --   --   PROT 6.9  --  6.4*  --  7.0  --  7.1  --  6.4*  --   --   ALBUMIN  2.9*   < > 2.4*  2.4*   < > 2.5*  2.6*   < > 2.6*  2.6* 2.1* 2.3* 2.0* 2.0*   < > = values in this interval not displayed.   No results for input(s): LIPASE, AMYLASE in the last 168 hours.  Recent Labs  Lab 05/08/23 1600 05/09/23 0953 05/10/23 0455  05/13/23 0806  AMMONIA 40* <10 32 41*    ABG    Component Value Date/Time   PHART 7.421 05/12/2023 1544   PCO2ART 28.8 (L) 05/12/2023 1544   PO2ART 61 (L) 05/12/2023 1544   HCO3 21.9 05/13/2023 1512   HCO3 22.5 05/13/2023 1512   TCO2 23 05/13/2023 1512   TCO2 24 05/13/2023 1512   ACIDBASEDEF 2.0 05/13/2023 1512   ACIDBASEDEF 1.0 05/13/2023 1512   O2SAT 73 05/13/2023 1512   O2SAT 75 05/13/2023 1512     Coagulation Profile: Recent Labs  Lab 05/08/23 1600 05/12/23 0506 05/14/23 0410 05/15/23 0445  INR 2.2* 1.1 2.3* 1.8*    Cardiac Enzymes: No results for input(s): CKTOTAL, CKMB, CKMBINDEX, TROPONINI in the last 168 hours.   HbA1C: Hemoglobin A1C  Date/Time Value Ref Range Status  11/05/2022 01:58 PM 9.1 (A) 4.0 - 5.6 % Final  08/05/2022 01:54 PM 8.1 (A) 4.0 - 5.6 % Final   HbA1c, POC (prediabetic range)  Date/Time Value Ref Range Status  11/05/2022 01:58 PM 9.1 (A) 5.7 - 6.4 % Final  08/05/2022 01:54 PM 8.1 (A) 5.7 - 6.4 % Final   HbA1c, POC (controlled diabetic range)  Date/Time Value Ref Range Status  11/05/2022 01:58 PM 9.1 (A) 0.0 - 7.0 % Final  08/05/2022 01:54 PM 8.1 (A) 0.0 - 7.0 % Final   HbA1c POC (<> result, manual entry)  Date/Time Value Ref Range Status  11/05/2022 01:58 PM 9.1 4.0 - 5.6 % Final  08/05/2022 01:54 PM 8.1 4.0 - 5.6 % Final   Hgb A1c MFr Bld  Date/Time Value Ref Range Status  05/05/2023 03:46 AM 10.2 (H) 4.8 - 5.6 % Final    Comment:    (NOTE) Pre diabetes:          5.7%-6.4%  Diabetes:              >6.4%  Glycemic control for   <7.0% adults with diabetes     CBG: Recent Labs  Lab 05/14/23 1611 05/14/23 2005 05/14/23 2320 05/15/23 0430 05/15/23 0734  GLUCAP 313* 272* 287* 179* 136*   CRITICAL CARE Performed by: Ronnald FORBES Gave   Total critical care time: 39 minutes  Critical care time was exclusive of separately billable procedures and treating other patients. Critical care was necessary to treat or  prevent imminent or life-threatening deterioration.  Critical care was time spent personally by me on the following activities: development of treatment plan with patient and/or surrogate as well as nursing, discussions with consultants, evaluation of patient's response to treatment, examination of patient, obtaining history from patient or surrogate, ordering and performing treatments and interventions, ordering and review of laboratory studies, ordering and review of radiographic studies, pulse oximetry and re-evaluation of patient's condition.  Ronnald Gave MSN, AGACNP-BC [facility] Amion for pager 05/15/2023, 9:46 AM

## 2023-05-15 NOTE — Progress Notes (Signed)
 eLink Physician-Brief Progress Note Patient Name: Dylan Owen DOB: 1965/10/30 MRN: [id number]   Date of Service  05/15/2023  HPI/Events of Note  CBG 337.  SSI with meals but not at nighttime.  Semglee  10 units nightly already ordered.  eICU Interventions  Switch supplements-Ensure to Glucerna  Add at bedtime sliding scale insulin      Intervention Category Intermediate Interventions: Hyperglycemia - evaluation and treatment  Dylan Owen 05/15/2023, 10:04 PM

## 2023-05-15 NOTE — Progress Notes (Signed)
 PHARMACY - ANTICOAGULATION Pharmacy Consult for bivalirudin  Indication: Atrial flutter  Allergies  Allergen Reactions   Ace Inhibitors     Kidney failure   Atorvastatin  Other (See Comments)    Arm pain   Prednisone     REACTION: Anxiety   Septra  [Sulfamethoxazole -Trimethoprim ]    Metformin  And Related Diarrhea    Patient Measurements: Height: 6' 1 (185.4 cm) Weight: 122.8 kg (270 lb 11.6 oz) IBW/kg (Calculated) : 79.9  Vital Signs: Temp: 97.3 F (36.3 C) (02/08 2300) Temp Source: Oral (02/08 2300) BP: 94/56 (02/09 0400) Pulse Rate: 72 (02/09 0400)  Labs: Recent Labs    05/13/23 0414 05/13/23 1512 05/13/23 1643 05/14/23 0410 05/14/23 1025 05/14/23 1214 05/14/23 1627 05/14/23 1742 05/15/23 0421 05/15/23 0445  HGB 16.8 18.7*  18.7*  --  15.2  --   --   --   --   --   --   HCT 52.2* 55.0*  55.0*  --  47.3  --   --   --   --   --   --   PLT 99*  --   --  135*  --   --   --   --   --   --   APTT  --   --    < > 89*   < > 86*  --  80*  --  79*  LABPROT  --   --   --  25.2*  --   --   --   --   --  21.2*  INR  --   --   --  2.3*  --   --   --   --   --  1.8*  CREATININE 2.34*  --    < > 4.05*  --   --  4.68*  --  5.47*  --    < > = values in this interval not displayed.    Estimated Creatinine Clearance: 20.5 mL/min (A) (by C-G formula based on SCr of 5.47 mg/dL (H)).  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  aPTT therapeutic at 79 sec on bivalirudin  0.05 mg/kg/hr. CBC is stable. No bleeding noted or issues with line per RN  Goal of Therapy:  aPTT 50-85 seconds Monitor platelets by anticoagulation protocol: Yes   Plan:  Continue bivalirudin  at 0.05 mg/kg/hr (dosing wt 127 kg) F/u daily aPTT and CBC Monitor for signs of bleeding.  Signe Dawn, PharmD PGY2 Cardiology Pharmacy Resident Please see amion for complete clinical pharmacist phone list 05/15/2023 6:35 AM

## 2023-05-15 NOTE — Plan of Care (Signed)
   Problem: Education: Goal: Ability to describe self-care measures that may prevent or decrease complications (Diabetes Survival Skills Education) will improve Outcome: Progressing

## 2023-05-16 DIAGNOSIS — J9601 Acute respiratory failure with hypoxia: Secondary | ICD-10-CM | POA: Diagnosis not present

## 2023-05-16 DIAGNOSIS — N179 Acute kidney failure, unspecified: Secondary | ICD-10-CM | POA: Diagnosis not present

## 2023-05-16 DIAGNOSIS — I5021 Acute systolic (congestive) heart failure: Secondary | ICD-10-CM | POA: Diagnosis not present

## 2023-05-16 DIAGNOSIS — E1165 Type 2 diabetes mellitus with hyperglycemia: Secondary | ICD-10-CM | POA: Diagnosis not present

## 2023-05-16 LAB — HEPATITIS B SURFACE ANTIGEN: Hepatitis B Surface Ag: NONREACTIVE

## 2023-05-16 LAB — HEPATIC FUNCTION PANEL
ALT: 40 U/L (ref 0–44)
AST: 35 U/L (ref 15–41)
Albumin: 2.3 g/dL — ABNORMAL LOW (ref 3.5–5.0)
Alkaline Phosphatase: 85 U/L (ref 38–126)
Bilirubin, Direct: 0.8 mg/dL — ABNORMAL HIGH (ref 0.0–0.2)
Indirect Bilirubin: 1.2 mg/dL — ABNORMAL HIGH (ref 0.3–0.9)
Total Bilirubin: 2 mg/dL — ABNORMAL HIGH (ref 0.0–1.2)
Total Protein: 5.9 g/dL — ABNORMAL LOW (ref 6.5–8.1)

## 2023-05-16 LAB — BASIC METABOLIC PANEL
Anion gap: 20 — ABNORMAL HIGH (ref 5–15)
BUN: 95 mg/dL — ABNORMAL HIGH (ref 6–20)
CO2: 20 mmol/L — ABNORMAL LOW (ref 22–32)
Calcium: 8.8 mg/dL — ABNORMAL LOW (ref 8.9–10.3)
Chloride: 91 mmol/L — ABNORMAL LOW (ref 98–111)
Creatinine, Ser: 6.86 mg/dL — ABNORMAL HIGH (ref 0.61–1.24)
GFR, Estimated: 9 mL/min — ABNORMAL LOW (ref >60)
Glucose, Bld: 271 mg/dL — ABNORMAL HIGH (ref 70–99)
Potassium: 4.6 mmol/L (ref 3.5–5.1)
Sodium: 131 mmol/L — ABNORMAL LOW (ref 135–145)

## 2023-05-16 LAB — CBC
HCT: 40.2 % (ref 39.0–52.0)
Hemoglobin: 13.4 g/dL (ref 13.0–17.0)
MCH: 26.2 pg (ref 26.0–34.0)
MCHC: 33.3 g/dL (ref 30.0–36.0)
MCV: 78.7 fL — ABNORMAL LOW (ref 80.0–100.0)
Platelets: 172 10*3/uL (ref 150–400)
RBC: 5.11 MIL/uL (ref 4.22–5.81)
RDW: 17.2 % — ABNORMAL HIGH (ref 11.5–15.5)
WBC: 12.4 10*3/uL — ABNORMAL HIGH (ref 4.0–10.5)
nRBC: 0 % (ref 0.0–0.2)

## 2023-05-16 LAB — PHOSPHORUS: Phosphorus: 3.1 mg/dL (ref 2.5–4.6)

## 2023-05-16 LAB — APTT
aPTT: 86 s — ABNORMAL HIGH (ref 24–36)
aPTT: 142 s — ABNORMAL HIGH (ref 24–36)
aPTT: 70 s — ABNORMAL HIGH (ref 24–36)

## 2023-05-16 LAB — COOXEMETRY PANEL
Carboxyhemoglobin: 1.7 % — ABNORMAL HIGH (ref 0.5–1.5)
Methemoglobin: 0.9 % (ref 0.0–1.5)
O2 Saturation: 75.6 %
Total hemoglobin: 14.3 g/dL (ref 12.0–16.0)

## 2023-05-16 LAB — MAGNESIUM: Magnesium: 2.6 mg/dL — ABNORMAL HIGH (ref 1.7–2.4)

## 2023-05-16 LAB — GLUCOSE, CAPILLARY
Glucose-Capillary: 259 mg/dL — ABNORMAL HIGH (ref 70–99)
Glucose-Capillary: 199 mg/dL — ABNORMAL HIGH (ref 70–99)
Glucose-Capillary: 203 mg/dL — ABNORMAL HIGH (ref 70–99)
Glucose-Capillary: 222 mg/dL — ABNORMAL HIGH (ref 70–99)

## 2023-05-16 LAB — PROTIME-INR
INR: 1.8 — ABNORMAL HIGH (ref 0.8–1.2)
Prothrombin Time: 20.8 s — ABNORMAL HIGH (ref 11.4–15.2)

## 2023-05-16 MED ORDER — INSULIN GLARGINE-YFGN 100 UNIT/ML ~~LOC~~ SOLN
20.0000 [IU] | Freq: Every day | SUBCUTANEOUS | Status: DC
  Administered 2023-05-16 – 2023-05-17 (×2): 20 [IU] via SUBCUTANEOUS
  Filled 2023-05-16 (×3): qty 0.2

## 2023-05-16 MED ORDER — HEPARIN SODIUM (PORCINE) 1000 UNIT/ML IJ SOLN
3200.0000 [IU] | Freq: Once | INTRAMUSCULAR | Status: AC
  Administered 2023-05-16: 3200 [IU]

## 2023-05-16 MED ORDER — ANTICOAGULANT SODIUM CITRATE 4% (200MG/5ML) IV SOLN: 5.0000 mL | Status: DC | PRN

## 2023-05-16 MED ORDER — ALTEPLASE 2 MG IJ SOLR: 2.0000 mg | Freq: Once | INTRAMUSCULAR | Status: DC | PRN

## 2023-05-16 MED ORDER — ALBUMIN HUMAN 25 % IV SOLN
25.0000 g | Freq: Once | INTRAVENOUS | Status: AC
  Administered 2023-05-16: 25 g via INTRAVENOUS
  Filled 2023-05-16: qty 100

## 2023-05-16 MED ORDER — INSULIN ASPART 100 UNIT/ML IJ SOLN
4.0000 [IU] | Freq: Three times a day (TID) | INTRAMUSCULAR | Status: DC
  Administered 2023-05-17 – 2023-05-18 (×5): 4 [IU] via SUBCUTANEOUS

## 2023-05-16 MED ORDER — HEPARIN SODIUM (PORCINE) 1000 UNIT/ML DIALYSIS
1000.0000 [IU] | INTRAMUSCULAR | Status: DC | PRN
  Administered 2023-05-20: 3800 [IU]
  Administered 2023-05-23: 1000 [IU]
  Filled 2023-05-16 (×4): qty 1

## 2023-05-16 NOTE — Progress Notes (Signed)
 Received patient in bed.Awake,alert and oriented x 4. Consent verified.   Access used:Left non tunnel hd catheter that worked well. Dressing on date.  Duration of treatment : 3.5 hours.  UF goal ; 1.5 L -Unmet, then even.  Tolerated treatment: Not tolerating uf set at 1.5 liter . Renal MD made aware ,run on even.  Medicines : Albumin  25 g iv.  Hand off to the patient's nurse with stable medical condition.

## 2023-05-16 NOTE — Progress Notes (Signed)
 PHARMACY - ANTICOAGULATION Pharmacy Consult for bivalirudin  Indication: Atrial flutter  Allergies  Allergen Reactions   Ace Inhibitors     Kidney failure   Atorvastatin  Other (See Comments)    Arm pain   Prednisone     REACTION: Anxiety   Septra  [Sulfamethoxazole -Trimethoprim ]    Metformin  And Related Diarrhea    Patient Measurements: Height: 6\' 1"  (185.4 cm) Weight: 125.1 kg (275 lb 12.7 oz) IBW/kg (Calculated) : 79.9  Vital Signs: Temp: 98.3 F (36.8 C) (02/10 1416) Temp Source: Oral (02/10 1416) BP: 88/61 (02/10 1545) Pulse Rate: 90 (02/10 1545)  Labs: Recent Labs    05/14/23 0410 05/14/23 1025 05/14/23 1627 05/14/23 1742 05/15/23 0421 05/15/23 0445 05/15/23 0710 05/16/23 0335 05/16/23 1303 05/16/23 1523  HGB 15.2  --   --   --   --   --  14.2 13.4  --   --   HCT 47.3  --   --   --   --   --  43.4 40.2  --   --   PLT 135*  --   --   --   --   --  165 172  --   --   APTT 89*   < >  --    < >  --  79*  --  86* 142* 70*  LABPROT 25.2*  --   --   --   --  21.2*  --  20.8*  --   --   INR 2.3*  --   --   --   --  1.8*  --  1.8*  --   --   CREATININE 4.05*  --  4.68*  --  5.47*  --   --  6.86*  --   --    < > = values in this interval not displayed.    Estimated Creatinine Clearance: 16.5 mL/min (A) (by C-G formula based on SCr of 6.86 mg/dL (H)).  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  aPTT is therapeutic at 70 seconds.  Goal of Therapy:  aPTT 50-85 seconds Monitor platelets by anticoagulation protocol: Yes   Plan:  Continue bivalirudin  at 0.045 mg/kg/hr (dosing wt 127 kg) F/u daily aPTT and CBC Monitor for signs of bleeding.    Levin Reamer, PharmD, BCPS, [facility] Clinical Pharmacist [phone] Please check AMION for all [facility] Pharmacy numbers 05/16/2023

## 2023-05-16 NOTE — Progress Notes (Signed)
 Patient is refusing his BiPAP mask stating that it is too uncomfortable to sleep in. High flow nasal cannula was placed instead. Will continue to monitor.

## 2023-05-16 NOTE — Progress Notes (Signed)
 Patient ID: Dylan Owen, male   DOB: 1965/09/19, 58 y.o.   MRN: [id number] [facility] Progress Note   Assessment/ Plan:   1. Acute kidney Injury: With essentially normal renal function at baseline (creatinine 1.1) and suffered ATN in the setting of shock.  He was oliguric overnight with some improvement of urine output but without functional renal recovery.  Will order for dialysis to maintain euvolemic state and manage azotemia. 2.  Mixed cardiogenic/septic shock: Clinically suspected to be associated with viral myocarditis/acute CHF exacerbation.  CRRT discontinued over the weekend and he has been about 3-4 L net positive; urine output appears to be picking up and is charted at 275 cc overnight.  Will undertake dialysis today with goal of ultrafiltration 2.5 L as tolerated by his blood pressure. 3.  Shock liver: Transaminases appear to be trending down with hemodynamic support.  Hemodynamically improving. 4.  Acute hypoxic/hypercarbic respiratory failure: Tolerating supplemental oxygen via nasal cannula after earlier requiring intubation/ventilator support and intermittent NIPPV.  Subjective:   Without acute events overnight, expresses desire to ambulate around hallway today.   Objective:   BP (!) 94/52   Pulse (!) 101   Temp 98 F (36.7 C) (Oral)   Resp (!) 24   Ht 6\' 1"  (1.854 m)   Wt 125.1 kg   SpO2 (!) 89%   BMI 36.39 kg/m   Intake/Output Summary (Last 24 hours) at 05/16/2023 0748 Last data filed at 05/16/2023 0700 Gross per 24 hour  Intake 1513.64 ml  Output 275 ml  Net 1238.64 ml   Weight change: 2.3 kg  Physical Exam: Gen: Awake and alert, watching television CVS: Pulse regular rhythm, normal rate, S1 and S2 normal Resp: Anteriorly clear to auscultation, no rales/rhonchi Abd: Soft, obese, nontender, bowel sounds normal Ext: 1+ bilateral lower extremity edema with skin wrinkling, trace-1+ upper extremity edema with some dependent edema  Imaging: No  results found.   Labs: BMET Recent Labs  Lab 05/12/23 1628 05/13/23 0414 05/13/23 1512 05/13/23 1643 05/13/23 1952 05/14/23 0410 05/14/23 1627 05/15/23 0421 05/16/23 0335  NA 133* 134* 139  139 133*  --  135 128* 132* 131*  K 4.3 4.2 4.2  4.2 3.8  --  4.3 4.7 4.3 4.6  CL 100 101  --  99  --  99 92* 97* 91*  CO2 19* 20*  --  16*  --  20* 18* 19* 20*  GLUCOSE 270* 173*  --  359*  --  188* 405* 179* 271*  BUN 38* 30*  --  30*  --  46* 62* 72* 95*  CREATININE 2.49* 2.34*  --  2.53*  --  4.05* 4.68* 5.47* 6.86*  CALCIUM  8.2* 8.3*  --  7.2*  --  8.2* 7.5* 8.0* 8.8*  PHOS 1.6* 1.6*  --  4.8* 5.2* 5.3* 3.4 4.1 3.1   CBC Recent Labs  Lab 05/13/23 0414 05/13/23 1512 05/14/23 0410 05/15/23 0710 05/16/23 0335  WBC 12.3*  --  13.6* 12.9* 12.4*  HGB 16.8 18.7*  18.7* 15.2 14.2 13.4  HCT 52.2* 55.0*  55.0* 47.3 43.4 40.2  MCV 81.8  --  81.7 79.8* 78.7*  PLT 99*  --  135* 165 172    Medications:     amiodarone   400 mg Oral BID   aspirin  EC  81 mg Oral Daily   bacitracin    Topical BID   Chlorhexidine  Gluconate Cloth  6 each Topical Daily   feeding supplement (GLUCERNA SHAKE)  237 mL  Oral TID BM   insulin  aspart  0-15 Units Subcutaneous TID WC   insulin  aspart  0-5 Units Subcutaneous QHS   insulin  aspart  2 Units Subcutaneous TID WC   insulin  glargine-yfgn  10 Units Subcutaneous QHS   lidocaine   1 patch Transdermal Q24H   methocarbamol   500 mg Oral BID   multivitamin  1 tablet Oral QHS   oxyCODONE   10 mg Oral Q6H   pantoprazole  (PROTONIX ) IV  40 mg Intravenous QHS    Clevester Dally, MD 05/16/2023, 7:48 AM

## 2023-05-16 NOTE — Progress Notes (Signed)
 Attempted to get patient into the chair this morning and he said that he would rather wait until around 0800 before he gets up.

## 2023-05-16 NOTE — Progress Notes (Signed)
 Physical Therapy Treatment Patient Details Name: Dylan Owen MRN: [id number] DOB: [birth year] Today's Date: 05/16/2023   History of Present Illness Pt is 58 yo presenting to [facility] as transfer from [facility] 2/1 due to mixed septic and cardiogenic shock. Pt presented to [facility] due to referral from MD on 05/05/23. Pt started on CRRT on 2/2, Intubated on 2/3 due to constant BiPAP use and unable to wean. Intubation was discontinued on 2/5. PMH: HTN, hyperlipidemia, DM II, sleep apnea, chronic back pain.    PT Comments  Pt eager to ambulate this date however once he began required max encouragement to increase distance. Pt with noted DOE and required 5 min rest break prior to second attempt to ambulate. Acute PT to cont to follow. Pt making great progress towards goals. Continue to recommend inpatient rehab program > 3 hrs a day to achieve maximal functional recovery.    If plan is discharge home, recommend the following: A lot of help with walking and/or transfers;Assistance with cooking/housework;Assist for transportation   Can travel by private vehicle        Equipment Recommendations  BSC/3in1;Wheelchair (measurements PT);Wheelchair cushion (measurements PT)    Recommendations for Other Services Rehab consult     Precautions / Restrictions Precautions Precautions: Fall Precaution Comments: watch SpO2, on 10Lo2 via [place] Restrictions Weight Bearing Restrictions Per Provider Order: No     Mobility  Bed Mobility Overal bed mobility: Needs Assistance Bed Mobility: Supine to Sit     Supine to sit: Mod assist     General bed mobility comments: verbal cues to start task, increased time, HOB elevated, modA for trunk elevation, with verbal cues pt able to scoot to EOB and put feet on the floor    Transfers Overall transfer level: Needs assistance Equipment used:  (eva walker) Transfers: Sit to/from Stand Sit to Stand: Mod assist, +2 safety/equipment           General transfer  comment: max directional verbal cues, tactile cues to initiate rocking forward, modA to power up and steady during transition of hands to EVA walker, completed 3 STS total    Ambulation/Gait Ambulation/Gait assistance: Mod assist, +2 safety/equipment Gait Distance (Feet): 30 Feet (x1, 80'1) Assistive device: Blanca Bunch Gait Pattern/deviations: Step-through pattern, Decreased stride length, Wide base of support (L foot in plantar flexion) Gait velocity: dec Gait velocity interpretation: <1.31 ft/sec, indicative of household ambulator   General Gait Details: heavy reliance on forearms on eva walker, noted DOE/SOB. SPO2 down to 77% on 10Lo2 via [place] however poor pleth, upon sitting SPO2 96%. max encouragement to attempt second bout of ambulation requiring 5 min seated rest break   Stairs             Wheelchair Mobility     Tilt Bed    Modified Rankin (Stroke Patients Only)       Balance Overall balance assessment: Needs assistance Sitting-balance support: Single extremity supported, Feet supported, Bilateral upper extremity supported, Feet unsupported Sitting balance-Leahy Scale: Fair     Standing balance support: Bilateral upper extremity supported, Reliant on assistive device for balance Standing balance-Leahy Scale: Poor Standing balance comment: dependent on external balance, trunk flexion, verbal cues to look forward not down at floor                            Cognition Arousal: Alert Behavior During Therapy: Anxious Overall Cognitive Status: No family/caregiver present to determine baseline cognitive functioning Area of  Impairment: Orientation, Attention, Memory, Following commands, Safety/judgement, Awareness, Problem solving                   Current Attention Level: Focused Memory: Decreased short-term memory Following Commands: Follows one step commands inconsistently, Follows one step commands with increased time Safety/Judgement:  Decreased awareness of safety, Decreased awareness of deficits Awareness: Intellectual Problem Solving: Slow processing, Decreased initiation, Difficulty sequencing, Requires verbal cues (Requires visual cues) General Comments: pt with noted confusion, delayed processing, impaired comprehension, and apprehension about "pushing it too much." Pt requiring max encouragement from PT and RN regarding attempting second bout of ambulation, however once started pt doubled the expectation        Exercises      General Comments General comments (skin integrity, edema, etc.): pt with abrasion across bridge of nose, SpO2 dec to 77% on 10LO2 via [place] during amb, 96% at rest on 10Lo2      Pertinent Vitals/Pain Pain Assessment Pain Assessment: No/denies pain    Home Living                          Prior Function            PT Goals (current goals can now be found in the care plan section) Acute Rehab PT Goals Patient Stated Goal: to improve mobility PT Goal Formulation: With patient Time For Goal Achievement: 05/25/23 Potential to Achieve Goals: Good Progress towards PT goals: Progressing toward goals    Frequency    Min 1X/week      PT Plan      Co-evaluation              AM-PAC PT "6 Clicks" Mobility   Outcome Measure  Help needed turning from your back to your side while in a flat bed without using bedrails?: A Little Help needed moving from lying on your back to sitting on the side of a flat bed without using bedrails?: A Little Help needed moving to and from a bed to a chair (including a wheelchair)?: A Lot Help needed standing up from a chair using your arms (e.g., wheelchair or bedside chair)?: A Lot Help needed to walk in hospital room?: A Lot Help needed climbing 3-5 steps with a railing? : Total 6 Click Score: 13    End of Session Equipment Utilized During Treatment: Gait belt;Oxygen Activity Tolerance: Patient tolerated treatment well;Patient  limited by fatigue Patient left: with call bell/phone within reach;with nursing/sitter in room;in chair Nurse Communication: Mobility status (Rn present to assist with ambulation) PT Visit Diagnosis: Unsteadiness on feet (R26.81);Other abnormalities of gait and mobility (R26.89);Muscle weakness (generalized) (M62.81)     Time: 8119-1478 PT Time Calculation (min) (ACUTE ONLY): 31 min  Charges:    $Gait Training: 8-22 mins $Therapeutic Activity: 8-22 mins PT General Charges $$ ACUTE PT VISIT: 1 Visit                     Renaee Caro, PT, DPT Acute Rehabilitation Services Secure chat preferred Office #: [phone]    Jenna Moan 05/16/2023, 9:20 AM

## 2023-05-16 NOTE — Progress Notes (Signed)
 PHARMACY - ANTICOAGULATION Pharmacy Consult for bivalirudin  Indication: Atrial flutter  Allergies  Allergen Reactions   Ace Inhibitors     Kidney failure   Atorvastatin  Other (See Comments)    Arm pain   Prednisone     REACTION: Anxiety   Septra  [Sulfamethoxazole -Trimethoprim ]    Metformin  And Related Diarrhea    Patient Measurements: Height: 6\' 1"  (185.4 cm) Weight: 125.1 kg (275 lb 12.7 oz) IBW/kg (Calculated) : 79.9  Vital Signs: Temp: 97.9 F (36.6 C) (02/10 0800) Temp Source: Oral (02/10 0800) BP: 111/74 (02/10 0800) Pulse Rate: 102 (02/10 0800)  Labs: Recent Labs    05/14/23 0410 05/14/23 1025 05/14/23 1627 05/14/23 1742 05/15/23 0421 05/15/23 0445 05/15/23 0710 05/16/23 0335  HGB 15.2  --   --   --   --   --  14.2 13.4  HCT 47.3  --   --   --   --   --  43.4 40.2  PLT 135*  --   --   --   --   --  165 172  APTT 89*   < >  --  80*  --  79*  --  86*  LABPROT 25.2*  --   --   --   --  21.2*  --  20.8*  INR 2.3*  --   --   --   --  1.8*  --  1.8*  CREATININE 4.05*  --  4.68*  --  5.47*  --   --  6.86*   < > = values in this interval not displayed.    Estimated Creatinine Clearance: 16.5 mL/min (A) (by C-G formula based on SCr of 6.86 mg/dL (H)).  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  aPTT is supratherapeutic at 86 sec on bivalirudin  0.05 mg/kg/hr. Hgb 13.4, plt 172. No s/sx of bleeding or infusion issues.   Goal of Therapy:  aPTT 50-85 seconds Monitor platelets by anticoagulation protocol: Yes   Plan:  Reduce bivalirudin  at 0.045 mg/kg/hr (dosing wt 127 kg) Order aPTT in 4 hours  F/u daily aPTT and CBC Monitor for signs of bleeding.  Thank you for allowing pharmacy to participate in this patient's care,  Nieves Bars, PharmD, BCCCP Clinical Pharmacist  Phone: [phone] 05/16/2023 8:47 AM  Please check AMION for all [facility] Pharmacy phone numbers After 10:00 PM, call Main Pharmacy [phone]

## 2023-05-16 NOTE — Plan of Care (Signed)
  Problem: Health Behavior/Discharge Planning: Goal: Ability to identify and utilize available resources and services will improve Outcome: Progressing Goal: Ability to manage health-related needs will improve Outcome: Progressing

## 2023-05-16 NOTE — TOC Progression Note (Signed)
 Transition of Care [facility]) - Progression Note    Patient Details  Name: Dylan Owen MRN: [id number] Date of Birth: 12/30/1965  [facility] CM/SW Contact  Benjiman Bras, RN Phone Number: [phone] 05/16/2023, 3:57 PM  Clinical Narrative:     TOC CM spoke to pt and states he lives at home with wife. He has Rollator at home. Pt will need HH RN and PT, and oxygen for home. Offered choice for [facility], pt agreeable to [facility] for [facility]. Contacted [facility], Cory with new referral. Will need HH orders and qualifying sats.   Expected Discharge Plan: Home w Home Health Services Barriers to Discharge: Continued Medical Work up  Expected Discharge Plan and Services   Discharge Planning Services: CM Consult Post Acute Care Choice: Home Health Living arrangements for the past 2 months: Single Family Home                           HH Arranged: RN, PT [facility] Agency: [facility] Date [facility] Agency Contacted: 05/16/23 Time HH Agency Contacted: 1555 Representative spoke with at [facility] Agency: Marcina Severe   Social Determinants of Health (SDOH) Interventions SDOH Screenings   Food Insecurity: No Food Insecurity (05/05/2023)  Recent Concern: Food Insecurity - Food Insecurity Present (05/05/2023)  Housing: Low Risk  (05/05/2023)  Transportation Needs: No Transportation Needs (05/05/2023)  Utilities: Not At Risk (05/05/2023)  Alcohol Screen: Low Risk  (11/05/2020)  Depression (PHQ2-9): High Risk (11/26/2022)  Financial Resource Strain: High Risk (12/05/2022)  Physical Activity: Unknown (12/05/2022)  Social Connections: Moderately Isolated (05/05/2023)  Stress: Stress Concern Present (12/05/2022)  Tobacco Use: High Risk (05/05/2023)    Readmission Risk Interventions     No data to display

## 2023-05-16 NOTE — Progress Notes (Addendum)
 Advanced Heart Failure Rounding Note  HF Cardiologist: Dr. Alease Amend   Chief Complaint: Mixed Cardiogenic and Septic Shock    Patient Profile   58 y.o. male with a PMH of obesity, HTN, HLD, DM2, and OSA admitted w/ mixed septic and cardiogenic shock>>progression to anuric renal failure requiring CRRT.  Interval Hx:    2/3: worsening septic shock>> fever up to 101, increase LA, escalation of pressors, delirium and worsening respiratory failure>>intubated. Milrinone  stopped due to frequent ectopy. Abx broadened. Vanc added to meropenum. CT of C/A/P w/ multifocal PNA and s/o cystitis. 2/4: Marked improvement. Resolution of fever and delirium. Weaned off pressors, became hypertensive and started on cleviprex  gtt  2/5: Extubated, weaned off Cleviprex , Cortrak placed (post pyloric) 2/7: RHC, DCCV to NSR.   Subjective:    Coox pending 275 cc UOP yesterday with albumin  25g + lasix  80 mg  Significant increase in sCr 4.68>5.47>6.86. Plan for HD today.  Feeling better this morning. Sitting up in bed. No CP or palpitations. Wanting up ambulate to the chair.   Objective:    Weight Range: 125.1 kg Body mass index is 36.39 kg/m.   Vital Signs:   Temp:  [97.6 F (36.4 C)-98.1 F (36.7 C)] 98 F (36.7 C) (02/10 0400) Pulse Rate:  [87-108] 101 (02/10 0700) Resp:  [16-32] 24 (02/10 0700) BP: (81-130)/(47-113) 94/52 (02/10 0600) SpO2:  [87 %-100 %] 89 % (02/10 0700) Weight:  [125.1 kg] 125.1 kg (02/10 0500) Last BM Date : 05/13/23  Weight change: Filed Weights   05/14/23 0500 05/15/23 0434 05/16/23 0500  Weight: 123.1 kg 122.8 kg 125.1 kg   Intake/Output:   Intake/Output Summary (Last 24 hours) at 05/16/2023 0751 Last data filed at 05/16/2023 0700 Gross per 24 hour  Intake 1513.64 ml  Output 275 ml  Net 1238.64 ml    Physical Exam   CVP 3-4 General: Weak appearing. No distress on [place] Cardiac: JVP not visible. S1 and S2 present. No murmurs or rub. Abdomen: Soft, non-tender,  non-distended. + BS. Extremities: Warm and dry. No rash, cyanosis.  No peripheral edema.  Neuro: Alert and oriented x3. Affect pleasant.  Lines/Devices:  L TDC,   RUE PICC,  Coude catheter  Telemetry: ST in 100s with occasional dropped beats (personally reviewed)  Labs    CBC Recent Labs    05/15/23 0710 05/16/23 0335  WBC 12.9* 12.4*  HGB 14.2 13.4  HCT 43.4 40.2  MCV 79.8* 78.7*  PLT 165 172   Basic Metabolic Panel Recent Labs    73/22/02 0421 05/16/23 0335  NA 132* 131*  K 4.3 4.6  CL 97* 91*  CO2 19* 20*  GLUCOSE 179* 271*  BUN 72* 95*  CREATININE 5.47* 6.86*  CALCIUM  8.0* 8.8*  MG 2.5* 2.6*  PHOS 4.1 3.1   Liver Function Tests Recent Labs    05/14/23 0410 05/14/23 1627 05/15/23 0421 05/16/23 0335  AST 55*  --   --  35  ALT 113*  --   --  40  ALKPHOS 97  --   --  85  BILITOT 2.5*  --   --  2.0*  PROT 6.4*  --   --  5.9*  ALBUMIN  2.3*   < > 2.0* 2.3*   < > = values in this interval not displayed.   No results for input(s): "LIPASE", "AMYLASE" in the last 72 hours.  Cardiac Enzymes No results for input(s): "CKTOTAL", "CKMB", "CKMBINDEX", "TROPONINI" in the last 72 hours.   BNP: BNP (  last 3 results) Recent Labs    05/04/23 1645  BNP 1,275.2*   Medications:    Scheduled Medications:  amiodarone   400 mg Oral BID   aspirin  EC  81 mg Oral Daily   bacitracin    Topical BID   Chlorhexidine  Gluconate Cloth  6 each Topical Daily   feeding supplement (GLUCERNA SHAKE)  237 mL Oral TID BM   insulin  aspart  0-15 Units Subcutaneous TID WC   insulin  aspart  0-5 Units Subcutaneous QHS   insulin  aspart  2 Units Subcutaneous TID WC   insulin  glargine-yfgn  10 Units Subcutaneous QHS   lidocaine   1 patch Transdermal Q24H   methocarbamol   500 mg Oral BID   multivitamin  1 tablet Oral QHS   oxyCODONE   10 mg Oral Q6H   pantoprazole  (PROTONIX ) IV  40 mg Intravenous QHS    Infusions:  bivalirudin  (ANGIOMAX ) 250 mg in sodium chloride  0.9 % 500 mL (0.5 mg/mL)  infusion 0.05 mg/kg/hr (05/16/23 0700)    PRN Medications: acetaminophen  (TYLENOL ) oral liquid 160 mg/5 mL, albuterol , HYDROmorphone  (DILAUDID ) injection, naLOXone  (NARCAN )  injection, nitroGLYCERIN , ondansetron  (ZOFRAN ) IV, mouth rinse  Assessment/Plan   Mixed cardiogenic/septic shock: Presented febrile, cold on exam, with worsening blood pressure. TEE w/ moderately reduced LVEF. Initially on Milrinone . 2/3 developed worsening septic shock w/ fever, increase in lactic acid, delirium, and worsening respiratory failure>>intubated. Milrinone  stopped due to frequent ectopy. CT of C/A/P w/ multifocal PNA and s/o cystitis. UA dirty, Ucx multiple species, Respiratory Cx no growth, Bcx NGTD - RHC 2/7: CI 2 to 2.3 L/min/m in AF. - CVP 3-4 - WBC stable 12.4. Completed 7 day course Meropenem . - Co-ox pending this am  Acute systolic CHF - Suspect stress CM in setting of sepsis - Echo 1/30: Unable to estimate EF d/t image quality, RWMA - Limited echo 02/06: EF 40-45%, RWMA, RV reduced - RHC 2/7 CI of 2-2.3 in AF. I suspect his LVEF will improve now that he is out of septic shock - CVP 4-5. Discussed UF dosing with Nephrology d/t concern for hypovolemia with hypotension and tachycardia. - GDMT limited by hypotension and renal function  Acute hypoxic and hypercarbic respiratory failure Hx OSA - Secondary to PNA and acute heart failure. Intubated 2/3 - extubated 2/5 - continue nightly BiPAP/ during day  Atrial flutter with RVR - NSR on tele  - continue amiodarone  400mg  BID  Thrombocytopenia - Platelets 237>95.  - Resolved. 172 today - continue bivalirudin . Heparin  stopped. HIT panel negative.  - can transition to DOAC after deciding on tunneled dialysis catheter.  Hypertension  - holding hydralazine . hypotensive  AKI: suspect ATN 2/2 mixed shock. Remains anuric to oliguric - Remains anuric to oliguric - 275 cc UOP with diuretic challenge - Nephrology following. Plan to start HD today,  discussed UF dosing with pharmD.   Shock liver: - 2/2 mixed shock/hepatic congestion from CHF  - Improving  Diabetes: poorly controlled, A1c 10.2   - insulin  management per CCM   Acute metabolic encephalopathy - resolved  Deconditioning - appears extremely weak - Aggressive PT/OT   CRITICAL CARE Performed by: [place] Lee  Total critical care time: 25 minutes  Critical care time was exclusive of separately billable procedures and treating other patients.  Critical care was necessary to treat or prevent imminent or life-threatening deterioration.  Critical care was time spent personally by me on the following activities: development of treatment plan with patient and/or surrogate as well as nursing, discussions with consultants, evaluation of patient's  response to treatment, examination of patient, obtaining history from patient or surrogate, ordering and performing treatments and interventions, ordering and review of laboratory studies, ordering and review of radiographic studies, pulse oximetry and re-evaluation of patient's condition.  Length of Stay: 30  [place] Lee, NP  05/16/2023, 7:51 AM  Advanced Heart Failure Team Pager [phone] (M-F; 7a - 5p)  Please contact [facility] for night-coverage after hours (5p -7a ) and weekends on [email or web address]  Patient seen with NP, I formulated the plan and agree with the above note.   Feels generally good this morning.  SBP 90s-100s.  CVP < 5 on my read with co-ox 66%. UOP 275 cc.   He remains in NSR on po amiodarone  and bivalirudin  gtt.   General: NAD Neck: No JVD, no thyromegaly or thyroid  nodule.  Lungs: Clear to auscultation bilaterally with normal respiratory effort. CV: Nondisplaced PMI.  Heart regular S1/S2, no S3/S4, no murmur.  No peripheral edema.   Abdomen: Soft, nontender, no hepatosplenomegaly, no distention.  Skin: Intact without lesions or rashes.  Neurologic: Alert and oriented x 3.  Psych: Normal  affect. Extremities: No clubbing or cyanosis.  HEENT: Normal.   Continue po amiodarone  and bivalirudin , transition to apixaban  when all procedures completed. HIT negative, thrombocytopenia resolved.   He remains oliguric.  CVP < 5.  Start intermittent HD today, will need to be gentle with UF with soft BP.  I suspect he will need ongoing HD.   EF 40-45% with RV dysfunction on last echo, no BP room for GDMT.   If he tolerates intermittent HD today, could likely move out of unit.  Can be followed by general cardiology on floor.   CRITICAL CARE Performed by: Peder Bourdon  Total critical care time: 40 minutes  Critical care time was exclusive of separately billable procedures and treating other patients.  Critical care was necessary to treat or prevent imminent or life-threatening deterioration.  Critical care was time spent personally by me on the following activities: development of treatment plan with patient and/or surrogate as well as nursing, discussions with consultants, evaluation of patient's response to treatment, examination of patient, obtaining history from patient or surrogate, ordering and performing treatments and interventions, ordering and review of laboratory studies, ordering and review of radiographic studies, pulse oximetry and re-evaluation of patient's condition.  Peder Bourdon 05/16/2023 9:16 AM

## 2023-05-16 NOTE — Plan of Care (Signed)
  Problem: Coping: Goal: Ability to adjust to condition or change in health will improve Outcome: Progressing   Problem: Fluid Volume: Goal: Ability to maintain a balanced intake and output will improve Outcome: Not Progressing   Problem: Health Behavior/Discharge Planning: Goal: Ability to manage health-related needs will improve Outcome: Progressing   Problem: Metabolic: Goal: Ability to maintain appropriate glucose levels will improve Outcome: Not Progressing   Problem: Nutritional: Goal: Maintenance of adequate nutrition will improve Outcome: Progressing Goal: Progress toward achieving an optimal weight will improve Outcome: Not Progressing   Problem: Skin Integrity: Goal: Risk for impaired skin integrity will decrease Outcome: Progressing   Problem: Tissue Perfusion: Goal: Adequacy of tissue perfusion will improve Outcome: Progressing   Problem: Education: Goal: Knowledge of General Education information will improve Description: Including pain rating scale, medication(s)/side effects and non-pharmacologic comfort measures Outcome: Progressing   Problem: Health Behavior/Discharge Planning: Goal: Ability to manage health-related needs will improve Outcome: Progressing   Problem: Clinical Measurements: Goal: Ability to maintain clinical measurements within normal limits will improve Outcome: Progressing Goal: Will remain free from infection Outcome: Progressing Goal: Diagnostic test results will improve Outcome: Progressing Goal: Respiratory complications will improve Outcome: Not Progressing Goal: Cardiovascular complication will be avoided Outcome: Progressing   Problem: Activity: Goal: Risk for activity intolerance will decrease Outcome: Progressing   Problem: Nutrition: Goal: Adequate nutrition will be maintained Outcome: Progressing   Problem: Coping: Goal: Level of anxiety will decrease Outcome: Progressing   Problem: Elimination: Goal: Will  not experience complications related to bowel motility Outcome: Progressing Goal: Will not experience complications related to urinary retention Outcome: Progressing   Problem: Pain Managment: Goal: General experience of comfort will improve and/or be controlled Outcome: Progressing   Problem: Safety: Goal: Ability to remain free from injury will improve Outcome: Progressing   Problem: Skin Integrity: Goal: Risk for impaired skin integrity will decrease Outcome: Progressing

## 2023-05-16 NOTE — Progress Notes (Signed)
 NAME:  Dylan Owen, MRN:  [id number], DOB:  Mar 12, 1966, LOS: 11 ADMISSION DATE:  05/04/2023, CONSULTATION DATE:  05/07/2023 REFERRING MD:  Aldona Amel - [facility], CHIEF COMPLAINT:  Resp failure shock    History of Present Illness:  58 year old male with PMHx significant for obesity, hypertension, hyperlipidemia, type 2 diabetes, sleep apnea, chronic back pain, depression, acid reflux.  Also depression significant home medications include Lasix , hydrochlorothiazide .  Lisinopril , metoprolol , nitroglycerin  but also Robaxin  and MS Contin  30 mg twice daily along with Repatha .  And Effexor .  Along with insulin  for diabetes.  She he walks around with a cane at baseline.  He also admits to medication noncompliance.  And 10 pound weight gain he is not on oxygen at home.  His hemoglobin A1c was 10.2 at admission consistent with very poor compliance.   Admitted on 05/04/2023 with complaints of cough, sore throat, dizziness and bodyaches for 1 week.  Also included chest tightness headaches chills diaphoresis.  At the time of arrival to the ED also had dyspnea on exertion.  At admission BNP was 1275.  Respiratory virus panel was negative.  He was started on Lasix  60 twice daily and his home lisinopril .  He was initially hypertensive and with this management blood pressure improved.  Echocardiogram showed low ejection fraction approximately 25% although poor acoustic windows.  Chest x-ray suggestive of venous congestion.  Cardiology consultation 05/06/2023 noted net -1.6 L since admission and patient was given history of increased frequency of urination.  Diagnose of viral myocarditis was made and left a right heart catheterization was considered.  Troponin barely elevated and consistent with viral myocarditis.   Postadmission with diuresis as of 05/06/2023 creatinine rose up to 1.57 mg percent [baseline 1 mg percent].  At this point lisinopril  was held.  811/31/25 developed hypoglycemia requiring infusion of dextrose  and  transferred to the intensive care unit.  Post transfer to the intensive care unit he developed acute fever and also BiPAP dependency.  Along with worsening renal failure further to creatinine of 2.83 mg percent along with persistent lactic acidosis around 3.  And mild intermittent delirium.  Overnight diaphoresis reported but despite all this nursing consistently reports that his periphery is extremely cold.   Cardiology has been reconsulted.  Critical care medicine consulted.  Pertinent Medical History:  B12 deficiency, Blood transfusion without reported diagnosis, Chronic low back pain, Depression, Diabetes mellitus with complication (HCC), GERD (gastroesophageal reflux disease), Heart murmur, History of adenomatous polyp of colon (05/22/2021), Hypercholesterolemia, Hypertension, OSA (obstructive sleep apnea), Peripheral neuropathy, Persistent asthma, Seizures (HCC), Stable angina (HCC), Statin intolerance, and Subclinical hypothyroidism (06/2017).    reports that he has never smoked. His smokeless tobacco use includes chew.  Significant Hospital Events: Including procedures, antibiotic start and stop dates in addition to other pertinent events   1/29 Admit to [facility], diuresis 1/30 ECHO w LVEF 25% 1/31 Cards consult, c/f viral myocarditis  2/1 PCCM consult txf to [facility], Adv HF consult  2/2 For HD cath placement and CRRT. Incr D10 for persistent hypogly   2/3 Milrinone  stopped. Weaning NE; intubated due to being on bipap for a few days and needing to go to CT  2/5 Extubated. Cleviprex  for hypertension   2/6 CT Head for anisocoria, no acute findings. Off clevi. Adding PRN metop. CRRT UF decr to 150/hr.  2/7 On BiPAP. RHC and DCCV  2/8 Off crrt  2/9 Albumin  + lasix  challenge. HIT neg    Interim History / Subjective:  No significant events overnight Overall  making good progress Breathing feels better, swelling improved Ambulated short distance in hall with PT/RN Plan for iHD today, will remain in  unit for first session to ensure hemodynamics tolerate  Objective:  Blood pressure (!) 94/52, pulse (!) 101, temperature 98 F (36.7 C), temperature source Oral, resp. rate (!) 24, height 6\' 1"  (1.854 m), weight 125.1 kg, SpO2 (!) 89%.        Intake/Output Summary (Last 24 hours) at 05/16/2023 0743 Last data filed at 05/16/2023 0700 Gross per 24 hour  Intake 1513.64 ml  Output 275 ml  Net 1238.64 ml   Filed Weights   05/14/23 0500 05/15/23 0434 05/16/23 0500  Weight: 123.1 kg 122.8 kg 125.1 kg   Physical Examination: General: Acute-on-chronically ill-appearing middle-aged man in NAD. Pleasant and conversant. HEENT: /AT, anicteric sclera, PERRL, moist mucous membranes. Neuro: Awake, oriented x 4. Responds to verbal stimuli. Following commands consistently. Moves all 4 extremities spontaneously. Strength 4/5 in all 4 extremities.  CV: Mildly tachycardic to 100s, regular rhythm, no m/g/r. PULM: Breathing even and unlabored on 10L [place]. Lung fields diminished at bases bilaterally. GI: Soft, nontender, nondistended. Normoactive bowel sounds. Extremities: Bilateral chronic 1+ LE edema noted. Skin: Warm/dry, skin changes of BLE c/w chronic venous stasis, multiple abrasions to bilateral shins and bridge of nose.  Resolved Hospital Problem list   Hypomagnesemia  Assessment & Plan:   Acute metabolic encephalopathy, improving - Correct metabolic derangements, iHD planned for today 2/10 - Delirium precautions - Maintain normal sleep/wake cycle as able - Busy activities such as busy board/apron, patient is very task-oriented - Neuroprotective measures: HOB > 30 degrees, normoglycemia, normothermia, electrolytes WNL  Septic shock -- UTI, multifocal MSSA PNA -- improved shock  Initial concern for cardiogenic shock w HFrEF, possible viral myocarditis but coox et al have been assuring against. Course has been most c/w septic shock, compounded by septic cardiomyopathy + pHTN, sequelae of  multisystem organ dysfxn. Cx have not identified an org, however clinically he continued to decline until we broadened to [facility], and turned around pretty rapidly thereafter.  - Goal MAP > 65 - Trend WBC, fever curve - F/u Cx data - S/p meropenem  x 7-day course  Acute hypoxic and hypercarbic resp failure MSSA PNA Hx OSA non on CPAP  - Supplemental O2 support for SpO2 >90% - BiPAP QHS and PRN - Wean FiO2 for O2 sat > 90% - Pulmonary hygiene, encourage OOB/mobility - Outpatient sleep evaluation recommended  New Afib/flutter s/p DCCV 2/7 -- now sinus  Acute (possibly on chronic) HFrEF-- septic cardiomyopathy  pHTN (groud II and III) Initial c/f myocarditis but less favored as course/workup went on. Repeat Echo EF 40-45%. RHC with normal R and L filling pressures, elevated PA mean and PVR. - Amiodarone  transitioned to PO - Bivalrudin for AC, HIT negative - Cardiac monitoring - Optimize electrolytes for K > 4, Mg > 2 - Encourage NIV compliance - Optimize volume status with iHD  Thrombocytopenia, improving  HIT ruled out  HIT ab 0.085, WNL. - Continue systemic AC (bival gtt) while monitoring for renal recovery - Remain on bival until need for [facility] determined - Consider DOAC once renal function improved  AKI likely ATN AGMA - Nephro following, appreciate assistance - Plan for iHD first session, to remain in-unit for close monitoring - Trend BMP - Replete electrolytes as indicated - Monitor I&Os - Avoid nephrotoxic agents as able - Ensure adequate renal perfusion  Elevated LFTs, improving mild hyperbilirubinemia mild hyperammonemia  Acute hep neg. Shock  liver most favored. Possible congestive hepatopathy component but if septic CM less favored   - LFTs grossly improved - Intermittent monitoring  DM2 - SSI - CBGs ACHS - Goal CBG 140-180  Code status discussion / GOC  Spoke w wife 2/3 who verified Full code/ full scope of practice - Remains FULL CODE at present  Best  Practice (right click and "Reselect all SmartList Selections" daily)   Diet/type: Regular consistency (see orders) DVT prophylaxis bival  Pressure ulcer(s): pressure ulcer assessment deferred  GI prophylaxis: PPI Lines:  Central line, Dialysis Catheter, and yes and it is still needed Foley:  Yes, and it is still needed Code Status:  full code Last date of multidisciplinary goals of care discussion [wife updated 2/6]  Critical care time: N/A   Gen[facility] [place] [facility] 05/16/23 7:44 AM  Please see [email or web address] for pager details.  From 7A-7P if no response, please call [phone] After hours, please call ELink [phone]

## 2023-05-16 NOTE — Inpatient Diabetes Management (Addendum)
 Inpatient Diabetes Program Recommendations  AACE/ADA: New Consensus Statement on Inpatient Glycemic Control (2015)  Target Ranges:  Prepandial:   less than 140 mg/dL      Peak postprandial:   less than 180 mg/dL (1-2 hours)      Critically ill patients:  140 - 180 mg/dL   Lab Results  Component Value Date   GLUCAP 199 (H) 05/16/2023   HGBA1C 10.2 (H) 05/05/2023    Review of Glycemic Control  Latest Reference Range & Units 05/15/23 21:36 05/16/23 07:56 05/16/23 11:10  Glucose-Capillary 70 - 99 mg/dL 811 (H) 914 (H) 782 (H)   Diabetes history: DM 2 Outpatient Diabetes medications:  Tresiba  80 units q AM and 72 units q PM Novolog  3 units tid with meals Freestyle Libre 3 Current orders for Inpatient glycemic control:  Novolog  0-15 units tid with meals  Novolog  2 units tid with meals (meal coverage) Semglee  10 units q HS  Inpatient Diabetes Program Recommendations:    Please consider increasing Semglee  to 20 units q HS.   Thanks,  Josefa Ni, RN, BC-ADM Inpatient Diabetes Coordinator Pager [phone]  (8a-5p)

## 2023-05-16 NOTE — Progress Notes (Signed)
 Inpatient Rehab Admissions Coordinator:   At this time we are recommending a CIR consult and I will place an order per our protocol.  Loye Rumble, PT, DPT Admissions Coordinator [phone] 05/16/23  4:43 PM

## 2023-05-17 DIAGNOSIS — E871 Hypo-osmolality and hyponatremia: Secondary | ICD-10-CM

## 2023-05-17 DIAGNOSIS — Z794 Long term (current) use of insulin: Secondary | ICD-10-CM | POA: Diagnosis not present

## 2023-05-17 DIAGNOSIS — J9601 Acute respiratory failure with hypoxia: Secondary | ICD-10-CM | POA: Diagnosis not present

## 2023-05-17 DIAGNOSIS — E118 Type 2 diabetes mellitus with unspecified complications: Secondary | ICD-10-CM | POA: Diagnosis not present

## 2023-05-17 DIAGNOSIS — R5381 Other malaise: Secondary | ICD-10-CM | POA: Diagnosis not present

## 2023-05-17 DIAGNOSIS — N179 Acute kidney failure, unspecified: Secondary | ICD-10-CM | POA: Diagnosis not present

## 2023-05-17 DIAGNOSIS — A419 Sepsis, unspecified organism: Secondary | ICD-10-CM | POA: Diagnosis not present

## 2023-05-17 DIAGNOSIS — I5021 Acute systolic (congestive) heart failure: Secondary | ICD-10-CM | POA: Diagnosis not present

## 2023-05-17 DIAGNOSIS — I509 Heart failure, unspecified: Secondary | ICD-10-CM | POA: Diagnosis not present

## 2023-05-17 DIAGNOSIS — I959 Hypotension, unspecified: Secondary | ICD-10-CM

## 2023-05-17 LAB — RENAL FUNCTION PANEL
Albumin: 2.4 g/dL — ABNORMAL LOW (ref 3.5–5.0)
Anion gap: 13 (ref 5–15)
BUN: 54 mg/dL — ABNORMAL HIGH (ref 6–20)
CO2: 23 mmol/L (ref 22–32)
Calcium: 8.1 mg/dL — ABNORMAL LOW (ref 8.9–10.3)
Chloride: 98 mmol/L (ref 98–111)
Creatinine, Ser: 4.66 mg/dL — ABNORMAL HIGH (ref 0.61–1.24)
GFR, Estimated: 14 mL/min — ABNORMAL LOW (ref >60)
Glucose, Bld: 202 mg/dL — ABNORMAL HIGH (ref 70–99)
Phosphorus: 3.7 mg/dL (ref 2.5–4.6)
Potassium: 4.2 mmol/L (ref 3.5–5.1)
Sodium: 134 mmol/L — ABNORMAL LOW (ref 135–145)

## 2023-05-17 LAB — APTT: aPTT: 71 s — ABNORMAL HIGH (ref 24–36)

## 2023-05-17 LAB — GLUCOSE, CAPILLARY
Glucose-Capillary: 169 mg/dL — ABNORMAL HIGH (ref 70–99)
Glucose-Capillary: 209 mg/dL — ABNORMAL HIGH (ref 70–99)
Glucose-Capillary: 98 mg/dL (ref 70–99)
Glucose-Capillary: 269 mg/dL — ABNORMAL HIGH (ref 70–99)
Glucose-Capillary: 377 mg/dL — ABNORMAL HIGH (ref 70–99)

## 2023-05-17 LAB — CBC
HCT: 36.3 % — ABNORMAL LOW (ref 39.0–52.0)
Hemoglobin: 12.1 g/dL — ABNORMAL LOW (ref 13.0–17.0)
MCH: 26.3 pg (ref 26.0–34.0)
MCHC: 33.3 g/dL (ref 30.0–36.0)
MCV: 78.9 fL — ABNORMAL LOW (ref 80.0–100.0)
Platelets: 174 10*3/uL (ref 150–400)
RBC: 4.6 MIL/uL (ref 4.22–5.81)
RDW: 18 % — ABNORMAL HIGH (ref 11.5–15.5)
WBC: 10.8 10*3/uL — ABNORMAL HIGH (ref 4.0–10.5)
nRBC: 0 % (ref 0.0–0.2)

## 2023-05-17 LAB — MAGNESIUM: Magnesium: 2.2 mg/dL (ref 1.7–2.4)

## 2023-05-17 LAB — PROTIME-INR
INR: 1.6 — ABNORMAL HIGH (ref 0.8–1.2)
Prothrombin Time: 19 s — ABNORMAL HIGH (ref 11.4–15.2)

## 2023-05-17 LAB — COOXEMETRY PANEL
Carboxyhemoglobin: 1.4 % (ref 0.5–1.5)
Methemoglobin: <0.7 % (ref 0.0–1.5)
O2 Saturation: 94.9 %
Total hemoglobin: 12.4 g/dL (ref 12.0–16.0)

## 2023-05-17 LAB — HEPATITIS B SURFACE ANTIBODY, QUANTITATIVE: Hep B S AB Quant (Post): <3.5 m[IU]/mL — ABNORMAL LOW

## 2023-05-17 MED ORDER — NICOTINE 14 MG/24HR TD PT24
14.0000 mg | MEDICATED_PATCH | Freq: Every day | TRANSDERMAL | Status: DC
  Administered 2023-05-17 – 2023-05-29 (×13): 14 mg via TRANSDERMAL
  Filled 2023-05-17 (×13): qty 1

## 2023-05-17 MED ORDER — FAMOTIDINE 20 MG PO TABS
20.0000 mg | ORAL_TABLET | Freq: Every day | ORAL | Status: DC
  Administered 2023-05-18 – 2023-05-29 (×12): 20 mg via ORAL
  Filled 2023-05-17 (×12): qty 1

## 2023-05-17 MED ORDER — ENSURE ENLIVE PO LIQD
237.0000 mL | Freq: Two times a day (BID) | ORAL | Status: DC
  Administered 2023-05-17 – 2023-05-29 (×21): 237 mL via ORAL

## 2023-05-17 MED ORDER — MIDODRINE HCL 5 MG PO TABS
5.0000 mg | ORAL_TABLET | Freq: Three times a day (TID) | ORAL | Status: DC
  Administered 2023-05-17 – 2023-05-27 (×31): 5 mg via ORAL
  Filled 2023-05-17 (×30): qty 1

## 2023-05-17 NOTE — Progress Notes (Signed)
Nutrition Follow-up  DOCUMENTATION CODES:   Not applicable  INTERVENTION:   - Ensure Enlive po BID, each supplement provides 350 kcal and 20 grams of protein  - Continue renal MVI daily  - "Plate Method for Diabetes" handout from the Academy of [employer] and [employer] provided to pt along with brief education; pt would benefit from additional education (with spouse present) closer to discharge  NUTRITION DIAGNOSIS:   Inadequate oral intake related to acute illness as evidenced by NPO status.  Progressing, pt now on Carb Modified diet with meal completions of 50-75%  GOAL:   Patient will meet greater than or equal to 90% of their needs  Progressing  MONITOR:   TF tolerance, Vent status, Labs, Weight trends  REASON FOR ASSESSMENT:   Consult Assessment of nutrition requirement/status (CRRT)  ASSESSMENT:   58 yo male admitted with new HFrEF and developed cardiogenic and septic shock requiring transfer to ICU, vasopressors and progressive anuric AKI initiation of CRRT, +shock liver. PMH includes HTN, DM, obesity, OSA, B12 deficiency, +smokeless tobacco use  1/30 - admitted 1/31 - BiPAP, NPO 2/01 - transferred to ICU 2/02 - CRRT initiated 2/03 - intubated, trickle TF initiated, CT: multifocal pneumonia, mild diffuse body wall edema 2/04 - TF titrated towards goal, held overnight with 1L output from OG 2/05 - Cortrak placed (post-pyloric), extubated 2/06 - pt pulled out Cortrak, diet advanced to Heart Healthy/Carb Modified (later liberalized to Carb Modified) 2/07 - s/p RHC and DCCV 2/08 - off CRRT 2/10 - iHD  Per notes, UF goal during iHD not met yesterday due to hypotension pressure. Post-HD weight yesterday was 125.1 kg. Plan for iHD tomorrow.  Weight down significantly since admission with lowest weight being 122.8 kg on 2/09. Pt still with non-pitting and mild pitting edema. Unsure of true dry weight.  Spoke with pt at bedside. Pt reports appetite is improving over  time. He states that he is trying to eat more and also trying to eat more of the right foods. Pt reports eating most of the beef pot roast and all of the mashed potatoes and corn on his lunch plate. Pt reports that he likes the Ensure supplements and will drink them when offered. Noted that supplements were changed to Glucerna on 2/09 due to hyperglycemia. CBG's are now better controlled with adjustments in insulin. Pt with high nutrient needs and would benefit from a nutrient-dense supplement, especially one that is high in protein. One Ensure Enlive supplement provides 350 kcals, 20 grams protein, and 44-45 grams of carbohydrate vs one Glucerna shake supplement which provides 220 kcals, 10 grams of protein, and 26 grams of carbohydrate. Given pt's history of DM, RD will continue to monitor PO intake and CBG's and adjust supplement regimen as appropriate.  Pt with questions about DM management. RD provided "Plate Method for Diabetes" handout from the Academy of [employer] and Dietetics to pt. Briefly discussed different food groups and their effects on blood sugar, emphasizing carbohydrate-containing foods. Provided list of carbohydrates. Discussed importance of controlled and consistent carbohydrate intake throughout the day. Pt expressed understanding and states he would like to continue to learn more prior to discharge, especially when wife is present.  Admit weight: 148.8 kg Current weight: 130 kg Lowest weight: 122.8 kg on 2/09  Meal Completions:  2/08: 75%, 75%  2/09: 75%  2/10: 50%, 50%  Medications reviewed and include: Glucerna Shake TID, SSI, novolog 4 units TID with meals, semglee 20 units daily at bedtime, rena-vit, IV protonix, bivalirudin gtt  Labs reviewed: sodium 134, BUN 54, creatinine 4.66, WBC 10.8 CBG's: 98-222 x 24 hours  UOP: 250 mL x 24 hours HD UF: 200 mL on 2/10 I/O's: -12.9 L since admit  Diet Order:   Diet Order             Diet Carb Modified Fluid consistency:  Thin; Room service appropriate? Yes  Diet effective now                   EDUCATION NEEDS:   Not appropriate for education at this time  Skin:  Skin Assessment: Skin Integrity Issues: Other: device related pressure injury to bridge of nose and L cheek from BiPAP mask  Last BM:  05/13/23  Height:   Ht Readings from Last 1 Encounters:  05/09/23 6\' 1"  (1.854 m)    Weight:   Wt Readings from Last 1 Encounters:  05/17/23 130 kg    BMI:  Body mass index is 37.81 kg/m.  Estimated Nutritional Needs:   Kcal:  2100-2400 kcals  Protein:  120-140 grams  Fluid:  UOP + 1000 mL    Mertie Clause, MS, RD, LDN Registered Dietitian II Please see AMiON for contact information.

## 2023-05-17 NOTE — Consult Note (Addendum)
 Physical Medicine and Rehabilitation Consult Reason for Consult: Debility Referring Physician: Chestine Spore   HPI: [name] is a 58 y.o. male with a history of obesity, diabetes who was admitted on 05/05/2023 with mixed septic and cardiogenic shock.  Patient developed delirium and worsening respiratory failure and was intubated on 05/09/2023.  He was extubated on 05/11/2023 with general improvement noted.  Cortrak tube was placed for nutrition.  Course complicated by acute kidney injury requiring dialysis for management.  Patient has been up with therapies and yesterday was mod assist for sit to stand transfers and was able to walk 30 feet using Eva walker with heavy use of upper body's for support.  Sats dropped into the 70s requiring increased oxygen supplementation.  Patient lives in a 1 level home with her spouse.  He was modified independent with a cane prior to this hospitalization.   Review of Systems  Constitutional:  Positive for malaise/fatigue. Negative for fever.  HENT:  Negative for hearing loss and tinnitus.   Eyes: Negative.   Respiratory:  Positive for cough and shortness of breath.   Cardiovascular:  Positive for leg swelling. Negative for chest pain.  Gastrointestinal:  Negative for heartburn.  Genitourinary:  Negative for hematuria.  Musculoskeletal:  Positive for joint pain and myalgias.  Neurological:  Positive for sensory change and weakness.  Psychiatric/Behavioral:  Positive for depression. Negative for hallucinations. The patient is not nervous/anxious.    Past Medical History:  Diagnosis Date   B12 deficiency    Blood transfusion without reported diagnosis    Chronic low back pain    Dr. Vear Clock is pain mgmt MD   Depression    Diabetes mellitus with complication (HCC)    DPN + microalbuminuria   GERD (gastroesophageal reflux disease)    Heart murmur    History of adenomatous polyp of colon 05/22/2021   recall 7 yrs   Hypercholesterolemia    Statin  intolerant.  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   Persistent asthma    Never smoker   Seizures (HCC)    Stable angina (HCC)    nl myoview 12/07  followed by Dr Eden Emms   Statin intolerance    myalgias   Subclinical hypothyroidism 06/2017   Past Surgical History:  Procedure Laterality Date   COLONOSCOPY  05/22/2021   Adenomas--recall 7 years   CYSTOSCOPY WITH RETROGRADE URETHROGRAM N/A 08/21/2021   Procedure: RETROGRADE URETHROGRAM;  Surgeon: Crist Fat, MD;  Location: [facility];  Service: Urology;  Laterality: N/A;   CYSTOSCOPY WITH URETHRAL DILATATION N/A 08/21/2021   Procedure: CYSTOSCOPY WITH URETHRAL DILATATION OPTILUME;  Surgeon: Crist Fat, MD;  Location: [facility];  Service: Urology;  Laterality: N/A;   LUMBAR SPINE SURGERY     lumbar laminectomy with hardware stabilization, with ensuing arachnoiditis   RIGHT HEART CATH N/A 05/13/2023   Procedure: RIGHT HEART CATH;  Surgeon: Dorthula Nettles, DO;  Location: [facility];  Service: Cardiovascular;  Laterality: N/A;   URETHRAL STRICTURE DILATATION  1996 and 2004.   Laser surgery.   Family History  Problem Relation Age of Onset   Cancer Paternal Grandfather        prostate   Heart disease Other    Hypertension Other    Other Other  Emotional Illness   Colon cancer Neg Hx    Esophageal cancer Neg Hx    Rectal cancer Neg Hx    Stomach cancer Neg Hx    Social History:  reports that he has never smoked. His smokeless tobacco use includes chew. He reports that he does not drink alcohol and does not use drugs. Allergies:  Allergies  Allergen Reactions   Ace Inhibitors     Kidney failure   Atorvastatin Other (See Comments)    Arm pain   Prednisone     REACTION: Anxiety   Septra [Sulfamethoxazole-Trimethoprim]     Metformin And Related Diarrhea   Medications Prior to Admission  Medication Sig Dispense Refill   Acetaminophen (TYLENOL PO) Take 500 mg by mouth daily as needed (pain).     albuterol (VENTOLIN HFA) 108 (90 Base) MCG/ACT inhaler Inhale 1-2 puffs into the lungs every 6 (six) hours as needed for wheezing or shortness of breath. 18 each 0   esomeprazole (NEXIUM) 40 MG capsule Take 1 capsule (40 mg total) by mouth daily. 90 capsule 1   Evolocumab (REPATHA SURECLICK) 140 MG/ML SOAJ INJECT 1 DOSE INTO THE SKIN EVERY 14 (FOURTEEN) DAYS. 6 mL 3   furosemide (LASIX) 20 MG tablet TAKE 1 TABLET BY MOUTH EVERY MORNING AS NEEDED FOR SWELLING 30 tablet 0   hydrochlorothiazide (HYDRODIURIL) 25 MG tablet Take 1 tablet (25 mg total) by mouth daily. 90 tablet 1   insulin aspart (NOVOLOG FLEXPEN) 100 UNIT/ML FlexPen 3 units SQ before breakfast, lunch, and dinner (Patient taking differently: Inject 3 Units into the skin 3 (three) times daily as needed for high blood sugar. 3 units SQ before breakfast, lunch, and dinner)     insulin degludec (TRESIBA FLEXTOUCH) 100 UNIT/ML FlexTouch Pen 80 U qAM and 72 U qPM     lisinopril (ZESTRIL) 40 MG tablet Take 1 tablet (40 mg total) by mouth daily. 90 tablet 1   methocarbamol (ROBAXIN) 500 MG tablet Take 500 mg by mouth 2 (two) times daily as needed for muscle spasms.     metoprolol tartrate (LOPRESSOR) 50 MG tablet TAKE 1 TABLET BY MOUTH TWICE A DAY. OFFICE VISIT NEEDED FOR FURTHER REFILLS 180 tablet 1   morphine (MS CONTIN) 30 MG 12 hr tablet Take 30-60 mg by mouth See admin instructions. Taking 60 mg in the AM and 30 mg in the afternoon and 60 mg at bedtime     Multiple Vitamin (MULTIVITAMIN) tablet Take 1 tablet by mouth daily.     nitroGLYCERIN (NITROSTAT) 0.4 MG SL tablet PLACE 1 TABLET UNDER THE TONGUE EVERY 5 MINUTES AS NEEDED. 25 tablet 1   venlafaxine XR (EFFEXOR-XR) 150 MG 24 hr capsule Take 150 mg by mouth 2 (two) times daily.  3   B-D ULTRAFINE III SHORT PEN 31G X  8 MM MISC USE AS DIRECTED TWICE DAILY FOR MEDICATION INJECTION 100 each 1   blood glucose meter kit and supplies KIT Dispense based on patient and insurance preference. Use to check glucose 4 times daily. 1 each 0   Continuous Glucose Receiver (FREESTYLE LIBRE 3 READER) DEVI Use to check glucose continuously 2 each 3   Continuous Glucose Sensor (FREESTYLE LIBRE 3 SENSOR) MISC Place 1 sensor on the skin every 14 days. Use to check glucose continuously 2 each 3   FREESTYLE TEST STRIPS test strip USE TO CHECK GLUCOSE 4 TIMES DAILY. 200 strip 4    Home: Home Living Family/patient expects to be discharged to:: Private residence Living Arrangements:  Spouse/significant other, Children (adult children ages 36, 29, and 45) Available Help at Discharge: Family, Available 24 hours/day Type of Home: House Home Access: Ramped entrance Home Layout: One level Bathroom Shower/Tub: Health visitor: Standard Bathroom Accessibility: Yes Home Equipment: Agricultural consultant (2 wheels), Shower seat - built in, [place] - single point Additional Comments: Home living and PLOF largely per chart review as pt able to provide only limited informaiton this session.  Functional History: Prior Function Prior Level of Function : Independent/Modified Independent, Driving Mobility Comments: Mod I, used cane for mobility ADLs Comments: Independent to Mod I for ADLs and IADLs. Enjoys woodworking/furniture making. Functional Status:  Mobility: Bed Mobility Overal bed mobility: Needs Assistance Bed Mobility: Supine to Sit Rolling: Mod assist, Max assist, Used rails Supine to sit: Mod assist Sit to supine: Contact guard assist General bed mobility comments: verbal cues to start task, increased time, HOB elevated, modA for trunk elevation, with verbal cues pt able to scoot to EOB and put feet on the floor Transfers Overall transfer level: Needs assistance Equipment used:  (eva walker) Transfers: Sit to/from  Stand Sit to Stand: Mod assist, +2 safety/equipment General transfer comment: max directional verbal cues, tactile cues to initiate rocking forward, modA to power up and steady during transition of hands to EVA walker, completed 3 STS total Ambulation/Gait Ambulation/Gait assistance: Mod assist, +2 safety/equipment Gait Distance (Feet): 30 Feet (x1, 80'1) Assistive device: Fara Boros Gait Pattern/deviations: Step-through pattern, Decreased stride length, Wide base of support (L foot in plantar flexion) General Gait Details: heavy reliance on forearms on eva walker, noted DOE/SOB. SPO2 down to 77% on 10Lo2 via [place] however poor pleth, upon sitting SPO2 96%. max encouragement to attempt second bout of ambulation requiring 5 min seated rest break Gait velocity: dec Gait velocity interpretation: <1.31 ft/sec, indicative of household ambulator    ADL: ADL Overall ADL's : Needs assistance/impaired Eating/Feeding: Set up, Bed level Eating/Feeding Details (indicate cue type and reason): for use of cup with straw for sips of water Grooming: Minimal assistance, Cueing for sequencing, Cueing for compensatory techniques, Bed level (cues for initiation) Upper Body Bathing: Moderate assistance, Bed level, Cueing for sequencing, Cueing for safety, Cueing for compensatory techniques (cues for initiation) Lower Body Bathing: Maximal assistance, Total assistance, Bed level, Cueing for sequencing, Cueing for compensatory techniques, Cueing for safety (cues for initiation) Upper Body Dressing : Minimal assistance, Bed level, Cueing for sequencing, Cueing for compensatory techniques, Cueing for safety (cues for initiation; with increased time for processing/motor planning) Lower Body Dressing: Total assistance, Bed level Toilet Transfer Details (indicate cue type and reason): not attempted this day due to RN request to see pt from bed level this session Toileting- Clothing Manipulation and Hygiene: Total  assistance, Bed level General ADL Comments: Pt with decreased activity tolerance and decreased cognition affecting functional level.  Cognition: Cognition Overall Cognitive Status: No family/caregiver present to determine baseline cognitive functioning Orientation Level: Oriented X4 Cognition Arousal: Alert Behavior During Therapy: Anxious Overall Cognitive Status: No family/caregiver present to determine baseline cognitive functioning Area of Impairment: Orientation, Attention, Memory, Following commands, Safety/judgement, Awareness, Problem solving Orientation Level: Disoriented to, Time (pt looking to calendar in room to determine month and then stating "2" instead of February and unable to report the year) Current Attention Level: Focused Memory: Decreased short-term memory Following Commands: Follows one step commands inconsistently, Follows one step commands with increased time Safety/Judgement: Decreased awareness of safety, Decreased awareness of deficits Awareness: Intellectual Problem Solving: Slow processing, Decreased initiation,  Difficulty sequencing, Requires verbal cues (Requires visual cues) General Comments: pt with noted confusion, delayed processing, impaired comprehension, and apprehension about "pushing it too much." Pt requiring max encouragement from PT and RN regarding attempting second bout of ambulation, however once started pt doubled the expectation  Blood pressure (!) 107/56, pulse (!) 103, temperature 98.6 F (37 C), temperature source Oral, resp. rate 18, height 6\' 1"  (1.854 m), weight 130 kg, SpO2 (!) 87%. Physical Exam Constitutional:      General: He is not in acute distress.    Appearance: He is obese.  HENT:     Head: Atraumatic.     Right Ear: External ear normal.     Left Ear: External ear normal.     Mouth/Throat:     Mouth: Mucous membranes are moist.  Eyes:     Extraocular Movements: Extraocular movements intact.     Pupils: Pupils are  equal, round, and reactive to light.  Cardiovascular:     Rate and Rhythm: Tachycardia present.  Pulmonary:     Effort: Pulmonary effort is normal. No respiratory distress.  Abdominal:     General: There is no distension.     Palpations: Abdomen is soft.  Musculoskeletal:        General: Swelling present.     Cervical back: Normal range of motion.     Right lower leg: Edema present.     Left lower leg: Edema present.  Skin:    General: Skin is warm.     Findings: Bruising and lesion present.     Comments: Abrasion on bridge of nose. Multiple other soft tissue abrasions/lacs  Neurological:     Comments: Alert and oriented x 3. Normal insight and awareness. Intact/reasonable memory. Normal language and speech. Cranial nerve exam unremarkable. MMT: UE 4-/5 deltoid, biceps, triceps and 4/5 wrist and HI. LE 3+ HF, KE and 4/5 ADF/PF. Stocking sensory loss below both knees and in bilateral fingers. DTR's 1+. No abnl resting tone.Marland Kitchen    Psychiatric:        Mood and Affect: Mood normal.        Behavior: Behavior normal.     Results for orders placed or performed during the hospital encounter of 05/04/23 (from the past 24 hours)  Cooxemetry Panel (carboxy, met, total hgb, O2 sat)     Status: Abnormal   Collection Time: 05/16/23 10:31 AM  Result Value Ref Range   Total hemoglobin 14.3 12.0 - 16.0 g/dL   O2 Saturation 16.1 %   Carboxyhemoglobin 1.7 (H) 0.5 - 1.5 %   Methemoglobin 0.9 0.0 - 1.5 %  Glucose, capillary     Status: Abnormal   Collection Time: 05/16/23 11:10 AM  Result Value Ref Range   Glucose-Capillary 199 (H) 70 - 99 mg/dL  APTT     Status: Abnormal   Collection Time: 05/16/23  1:03 PM  Result Value Ref Range   aPTT 142 (H) 24 - 36 seconds  APTT     Status: Abnormal   Collection Time: 05/16/23  3:23 PM  Result Value Ref Range   aPTT 70 (H) 24 - 36 seconds  Glucose, capillary     Status: Abnormal   Collection Time: 05/16/23  3:40 PM  Result Value Ref Range    Glucose-Capillary 203 (H) 70 - 99 mg/dL  Glucose, capillary     Status: Abnormal   Collection Time: 05/16/23 10:13 PM  Result Value Ref Range   Glucose-Capillary 222 (H) 70 - 99 mg/dL  Renal  function panel (daily at 0500)     Status: Abnormal   Collection Time: 05/17/23  4:28 AM  Result Value Ref Range   Sodium 134 (L) 135 - 145 mmol/L   Potassium 4.2 3.5 - 5.1 mmol/L   Chloride 98 98 - 111 mmol/L   CO2 23 22 - 32 mmol/L   Glucose, Bld 202 (H) 70 - 99 mg/dL   BUN 54 (H) 6 - 20 mg/dL   Creatinine, Ser 0.98 (H) 0.61 - 1.24 mg/dL   Calcium 8.1 (L) 8.9 - 10.3 mg/dL   Phosphorus 3.7 2.5 - 4.6 mg/dL   Albumin 2.4 (L) 3.5 - 5.0 g/dL   GFR, Estimated 14 (L) >60 mL/min   Anion gap 13 5 - 15  Magnesium     Status: None   Collection Time: 05/17/23  4:28 AM  Result Value Ref Range   Magnesium 2.2 1.7 - 2.4 mg/dL  CBC     Status: Abnormal   Collection Time: 05/17/23  4:28 AM  Result Value Ref Range   WBC 10.8 (H) 4.0 - 10.5 K/uL   RBC 4.60 4.22 - 5.81 MIL/uL   Hemoglobin 12.1 (L) 13.0 - 17.0 g/dL   HCT 11.9 (L) 14.7 - 82.9 %   MCV 78.9 (L) 80.0 - 100.0 fL   MCH 26.3 26.0 - 34.0 pg   MCHC 33.3 30.0 - 36.0 g/dL   RDW 56.2 (H) 13.0 - 86.5 %   Platelets 174 150 - 400 K/uL   nRBC 0.0 0.0 - 0.2 %  Cooxemetry Panel (carboxy, met, total hgb, O2 sat)     Status: None   Collection Time: 05/17/23  4:28 AM  Result Value Ref Range   Total hemoglobin 12.4 12.0 - 16.0 g/dL   O2 Saturation 78.4 %   Carboxyhemoglobin 1.4 0.5 - 1.5 %   Methemoglobin <0.7 0.0 - 1.5 %  Glucose, capillary     Status: Abnormal   Collection Time: 05/17/23  7:43 AM  Result Value Ref Range   Glucose-Capillary 209 (H) 70 - 99 mg/dL  Protime-INR     Status: Abnormal   Collection Time: 05/17/23  8:43 AM  Result Value Ref Range   Prothrombin Time 19.0 (H) 11.4 - 15.2 seconds   INR 1.6 (H) 0.8 - 1.2  APTT     Status: Abnormal   Collection Time: 05/17/23  8:43 AM  Result Value Ref Range   aPTT 71 (H) 24 - 36 seconds    No results found.  Assessment/Plan: Diagnosis: 58 year old male with debility after cardiogenic and septic shock Does the need for close, 24 hr/day medical supervision in concert with the patient's rehab needs make it unreasonable for this patient to be served in a less intensive setting? Yes Co-Morbidities requiring supervision/potential complications:  -Morbid obesity/obstructive sleep apnea -Ongoing hypoxia with supplemental oxygen requirements -Acute kidney injury requiring dialysis -History of atrial fibs with RVR -Uncontrolled diabetes with diabetic polyneuropathy Due to bladder management, bowel management, safety, skin/wound care, disease management, medication administration, pain management, and patient education, does the patient require 24 hr/day rehab nursing? Yes Does the patient require coordinated care of a physician, rehab nurse, therapy disciplines of PT, OT to address physical and functional deficits in the context of the above medical diagnosis(es)? Yes Addressing deficits in the following areas: balance, endurance, locomotion, strength, transferring, bowel/bladder control, bathing, dressing, feeding, grooming, toileting, and psychosocial support Can the patient actively participate in an intensive therapy program of at least 3 hrs of therapy per day  at least 5 days per week? Yes The potential for patient to make measurable gains while on inpatient rehab is good Anticipated functional outcomes upon discharge from inpatient rehab are modified independent and supervision  with PT, modified independent and supervision with OT, n/a with SLP. Estimated rehab length of stay to reach the above functional goals is: 12-16 days Anticipated discharge destination: Home Overall Rehab/Functional Prognosis: excellent  POST ACUTE RECOMMENDATIONS: This patient's condition is appropriate for continued rehabilitative care in the following setting: CIR Patient has agreed to participate in  recommended program. Yes Note that insurance prior authorization may be required for reimbursement for recommended care.  Comment: He is extremely motivated to lose weight and become healthier in general given what he's gone through. He is anxious to regain his functional mobility as part of that "new initiative." Rehab Admissions Coordinator to follow up.       I have personally performed a face to face diagnostic evaluation of this patient. Additionally, I have examined the patient's medical record including any pertinent labs and radiographic images.    Thanks,  Ranelle Oyster, MD 05/17/2023

## 2023-05-17 NOTE — Progress Notes (Addendum)
Patient ID: Dylan Owen, male   DOB: 01-11-1966, 58 y.o.   MRN: [id number] [facility] Progress Note   Assessment/ Plan:   1. Acute kidney Injury: With essentially normal renal function at baseline (creatinine 1.1) and suffered ATN in the setting of shock.  He was oliguric overnight with some improvement of urine output but without functional renal recovery.  Will order for dialysis to maintain euvolemic state and manage azotemia. Next HD tomorrow 2/12.  Add midodrine TID 2.  Mixed cardiogenic/septic shock: Clinically suspected to be associated with viral myocarditis/acute CHF exacerbation.  CRRT discontinued over the weekend and he has been about 3-4 L net positive; urine output appears to be picking up and is charted at 275 cc overnight.  Will undertake dialysis today with goal of ultrafiltration 2.5 L as tolerated by his blood pressure. 3.  Shock liver: Transaminases appear to be trending down with hemodynamic support.  Hemodynamically improving. 4.  Acute hypoxic/hypercarbic respiratory failure: Tolerating supplemental oxygen via nasal cannula after earlier requiring intubation/ventilator support and intermittent NIPPV.  Subjective:    Seen in room. UF goal not met yesterday.  For HD tomorrow   Objective:   BP (!) 107/56   Pulse (!) 103   Temp 98.6 F (37 C) (Oral)   Resp 18   Ht 6\' 1"  (1.854 m)   Wt 130 kg   SpO2 (!) 87%   BMI 37.81 kg/m   Intake/Output Summary (Last 24 hours) at 05/17/2023 9562 Last data filed at 05/17/2023 0900 Gross per 24 hour  Intake 811.31 ml  Output 485 ml  Net 326.31 ml   Weight change: 0 kg  Physical Exam: Gen: Awake and alert, watching television CVS: Pulse regular rhythm, normal rate, S1 and S2 normal Resp: Anteriorly clear to auscultation, no rales/rhonchi Abd: Soft, obese, nontender, bowel sounds normal Ext: 1+ bilateral lower extremity edema with skin wrinkling, trace-1+ upper extremity edema with some dependent  edema  Imaging: No results found.   Labs: BMET Recent Labs  Lab 05/13/23 0414 05/13/23 1512 05/13/23 1643 05/13/23 1952 05/14/23 0410 05/14/23 1627 05/15/23 0421 05/16/23 0335 05/17/23 0428  NA 134* 139  139 133*  --  135 128* 132* 131* 134*  K 4.2 4.2  4.2 3.8  --  4.3 4.7 4.3 4.6 4.2  CL 101  --  99  --  99 92* 97* 91* 98  CO2 20*  --  16*  --  20* 18* 19* 20* 23  GLUCOSE 173*  --  359*  --  188* 405* 179* 271* 202*  BUN 30*  --  30*  --  46* 62* 72* 95* 54*  CREATININE 2.34*  --  2.53*  --  4.05* 4.68* 5.47* 6.86* 4.66*  CALCIUM 8.3*  --  7.2*  --  8.2* 7.5* 8.0* 8.8* 8.1*  PHOS 1.6*  --  4.8* 5.2* 5.3* 3.4 4.1 3.1 3.7   CBC Recent Labs  Lab 05/14/23 0410 05/15/23 0710 05/16/23 0335 05/17/23 0428  WBC 13.6* 12.9* 12.4* 10.8*  HGB 15.2 14.2 13.4 12.1*  HCT 47.3 43.4 40.2 36.3*  MCV 81.7 79.8* 78.7* 78.9*  PLT 135* 165 172 174    Medications:     amiodarone  400 mg Oral BID   aspirin EC  81 mg Oral Daily   bacitracin   Topical BID   Chlorhexidine Gluconate Cloth  6 each Topical Daily   feeding supplement (GLUCERNA SHAKE)  237 mL Oral TID BM   insulin aspart  0-15 Units Subcutaneous TID WC   insulin aspart  0-5 Units Subcutaneous QHS   insulin aspart  4 Units Subcutaneous TID WC   insulin glargine-yfgn  20 Units Subcutaneous QHS   lidocaine  1 patch Transdermal Q24H   methocarbamol  500 mg Oral BID   multivitamin  1 tablet Oral QHS   oxyCODONE  10 mg Oral Q6H   pantoprazole (PROTONIX) IV  40 mg Intravenous QHS    Bufford Buttner MD 05/17/2023, 9:22 AM

## 2023-05-17 NOTE — Plan of Care (Signed)
  Problem: Health Behavior/Discharge Planning: Goal: Ability to identify and utilize available resources and services will improve Outcome: Progressing Goal: Ability to manage health-related needs will improve Outcome: Progressing

## 2023-05-17 NOTE — Progress Notes (Addendum)
Advanced Heart Failure Rounding Note  HF Cardiologist: Dr. Elwyn Lade   Chief Complaint: Mixed Cardiogenic and Septic Shock    Patient Profile   58 y.o. male with a PMH of obesity, HTN, HLD, DM2, and OSA admitted w/ mixed septic and cardiogenic shock>>progression to anuric renal failure requiring CRRT.  Interval Hx:    2/3: worsening septic shock>> fever up to 101, increase LA, escalation of pressors, delirium and worsening respiratory failure>>intubated. Milrinone stopped due to frequent ectopy. Abx broadened. Vanc added to meropenum. CT of C/A/P w/ multifocal PNA and s/o cystitis. 2/4: Marked improvement. Resolution of fever and delirium. Weaned off pressors, became hypertensive and started on cleviprex gtt  2/5: Extubated, weaned off Cleviprex, Cortrak placed (post pyloric) 2/7: RHC, DCCV AFL to NSR.   Subjective:    HD yesterday, did not make UF goal d/t hypotension. Planning for HD again tomorrow. Midodrine added by Nephrology.  Feeling well. No dyspnea. Ambulated around the unit with PT yesterday.   Objective:    Weight Range: 130 kg Body mass index is 37.81 kg/m.   Vital Signs:   Temp:  [97.8 F (36.6 C)-98.6 F (37 C)] 98.6 F (37 C) (02/11 0748) Pulse Rate:  [75-136] 103 (02/11 0700) Resp:  [10-32] 18 (02/11 0700) BP: (73-133)/(40-89) 107/56 (02/11 0600) SpO2:  [87 %-99 %] 87 % (02/11 0700) FiO2 (%):  [40 %] 40 % (02/11 0312) Weight:  [125.1 kg-130 kg] 130 kg (02/11 0500) Last BM Date : 05/13/23  Weight change: Filed Weights   05/16/23 0500 05/16/23 1859 05/17/23 0500  Weight: 125.1 kg 125.1 kg 130 kg   Intake/Output:   Intake/Output Summary (Last 24 hours) at 05/17/2023 0936 Last data filed at 05/17/2023 0900 Gross per 24 hour  Intake 811.31 ml  Output 485 ml  Net 326.31 ml    Physical Exam   CVP 1 General: Sitting up in bed. Appears in good spirits. Neck: L internal jugular trialysis catheter, RUE PICC Cor: PMI nondisplaced. Regular rate &  rhythm. No rubs, gallops or murmurs. Lungs: clear Abdomen: soft, nontender, nondistended.  Extremities: no cyanosis, clubbing, rash, 1+ edema Neuro: alert & orientedx3. Affect pleasant   Telemetry: ST 100s (personally reviewed)  Labs    CBC Recent Labs    05/16/23 0335 05/17/23 0428  WBC 12.4* 10.8*  HGB 13.4 12.1*  HCT 40.2 36.3*  MCV 78.7* 78.9*  PLT 172 174   Basic Metabolic Panel Recent Labs    16/10/96 0335 05/17/23 0428  NA 131* 134*  K 4.6 4.2  CL 91* 98  CO2 20* 23  GLUCOSE 271* 202*  BUN 95* 54*  CREATININE 6.86* 4.66*  CALCIUM 8.8* 8.1*  MG 2.6* 2.2  PHOS 3.1 3.7   Liver Function Tests Recent Labs    05/16/23 0335 05/17/23 0428  AST 35  --   ALT 40  --   ALKPHOS 85  --   BILITOT 2.0*  --   PROT 5.9*  --   ALBUMIN 2.3* 2.4*   No results for input(s): "LIPASE", "AMYLASE" in the last 72 hours.  Cardiac Enzymes No results for input(s): "CKTOTAL", "CKMB", "CKMBINDEX", "TROPONINI" in the last 72 hours.   BNP: BNP (last 3 results) Recent Labs    05/04/23 1645  BNP 1,275.2*   Medications:    Scheduled Medications:  amiodarone  400 mg Oral BID   aspirin EC  81 mg Oral Daily   bacitracin   Topical BID   Chlorhexidine Gluconate Cloth  6 each  Topical Daily   feeding supplement (GLUCERNA SHAKE)  237 mL Oral TID BM   insulin aspart  0-15 Units Subcutaneous TID WC   insulin aspart  0-5 Units Subcutaneous QHS   insulin aspart  4 Units Subcutaneous TID WC   insulin glargine-yfgn  20 Units Subcutaneous QHS   lidocaine  1 patch Transdermal Q24H   methocarbamol  500 mg Oral BID   midodrine  5 mg Oral TID WC   multivitamin  1 tablet Oral QHS   oxyCODONE  10 mg Oral Q6H   pantoprazole (PROTONIX) IV  40 mg Intravenous QHS    Infusions:  anticoagulant sodium citrate     bivalirudin (ANGIOMAX) 250 mg in sodium chloride 0.9 % 500 mL (0.5 mg/mL) infusion 0.045 mg/kg/hr (05/17/23 0700)    PRN Medications: acetaminophen (TYLENOL) oral liquid 160  mg/5 mL, albuterol, alteplase, anticoagulant sodium citrate, heparin, HYDROmorphone (DILAUDID) injection, naLOXone (NARCAN)  injection, nitroGLYCERIN, ondansetron (ZOFRAN) IV, mouth rinse  Assessment/Plan   Mixed cardiogenic/septic shock: Presented febrile, cold on exam, with worsening blood pressure. TEE w/ moderately reduced LVEF. Initially on Milrinone. 2/3 developed worsening septic shock w/ fever, increase in lactic acid, delirium, and worsening respiratory failure>>intubated. Milrinone stopped due to frequent ectopy. CT of C/A/P w/ multifocal PNA and s/o cystitis. UA dirty, Ucx multiple species, Respiratory Cx rare MSSA, Bcx NG. - RHC 2/7: CI 2 to 2.3 L/min/m in AF. - CVP 1 - WBCs coming down, 10.8 this am. Completed 7 day course Meropenem. - CO-OX 95%, recheck  Acute systolic CHF - Suspect stress CM in setting of sepsis - Echo 1/30: Unable to estimate EF d/t image quality, RWMA - Limited echo 02/06: EF 40-45%, RWMA, RV reduced - RHC 2/7 CI of 2-2.3 in AF. I suspect his LVEF will improve now that he is out of septic shock - CVP 1. Did not reach UF goal yesterday d/t hypotension. ? If needs some volume back (albumin).  - GDMT limited by hypotension and renal function  Acute hypoxic and hypercarbic respiratory failure Hx OSA - Secondary to PNA and acute heart failure. Intubated 2/3 - extubated 2/5 - continue nightly BiPAP/[place] during day  Atrial flutter with RVR - NSR on tele  - continue amiodarone 400mg  BID  Thrombocytopenia - Platelets 237>95.  - Resolved. 174K today - continue bivalirudin. Heparin stopped. HIT panel negative.  - can transition to DOAC after deciding on tunneled dialysis catheter.  Hypertension  - holding hydralazine. BP soft  AKI: suspect ATN 2/2 mixed shock. Remains anuric to oliguric - Remains anuric to oliguric - 250 cc UOP yesterday + 50 cc today - Nephrology following. Plan for HD again tomorrow. See discussion above.   Shock liver: - 2/2 mixed  shock/hepatic congestion from CHF  - Improving  Diabetes: poorly controlled, A1c 10.2   - insulin management per CCM   Acute metabolic encephalopathy - resolved  Deconditioning - appears extremely weak - Aggressive PT/OT   Length of Stay: 12  FINCH, LINDSAY N, PA-C  05/17/2023, 9:36 AM  Advanced Heart Failure Team Pager [phone] (M-F; 7a - 5p)  Please contact [facility] for night-coverage after hours (5p -7a ) and weekends on [email or web address]  Patient seen with PA, I formulated the plan and agree with the above note.   Patient had hypotension with HD yesterday, limited his UF.  Currently, SBP 110s.  Denies lightheadedness, walked with PT.   General: NAD Neck: No JVD, no thyromegaly or thyroid nodule.  Lungs: Clear to auscultation bilaterally with normal  respiratory effort. CV: Nondisplaced PMI.  Heart regular S1/S2, no S3/S4, no murmur. Trace ankle edema.   Abdomen: Soft, nontender, no hepatosplenomegaly, no distention.  Skin: Intact without lesions or rashes.  Neurologic: Alert and oriented x 3.  Psych: Normal affect. Extremities: No clubbing or cyanosis.  HEENT: Normal.   Continue po amiodarone and bivalirudin for atrial flutter, transition to apixaban when all procedures completed. HIT negative, thrombocytopenia resolved.    He remains oliguric.  CVP 1 today.  He had hypotension with HD yesterday.  Start midodrine 5 mg tid and encourage po hydration, try HD again tomorrow.  I suspect he will need ongoing HD.    EF 40-45% with RV dysfunction on last echo, no BP room for GDMT.   Mobilize with PT  Marca Ancona 05/17/2023 11:00 AM

## 2023-05-17 NOTE — Progress Notes (Signed)
PHARMACY - ANTICOAGULATION Pharmacy Consult for bivalirudin Indication: Atrial flutter  Allergies  Allergen Reactions   Ace Inhibitors     Kidney failure   Atorvastatin Other (See Comments)    Arm pain   Prednisone     REACTION: Anxiety   Septra [Sulfamethoxazole-Trimethoprim]    Metformin And Related Diarrhea    Patient Measurements: Height: 6\' 1"  (185.4 cm) Weight: 130 kg (286 lb 9.6 oz) IBW/kg (Calculated) : 79.9  Vital Signs: Temp: 98.6 F (37 C) (02/11 0748) Temp Source: Oral (02/11 0748) BP: 119/68 (02/11 0900) Pulse Rate: 105 (02/11 0900)  Labs: Recent Labs    05/15/23 0421 05/15/23 0445 05/15/23 0710 05/15/23 0710 05/16/23 0335 05/16/23 1303 05/16/23 1523 05/17/23 0428 05/17/23 0843  HGB  --   --  14.2   < > 13.4  --   --  12.1*  --   HCT  --   --  43.4  --  40.2  --   --  36.3*  --   PLT  --   --  165  --  172  --   --  174  --   APTT  --  79*  --   --  86* 142* 70*  --  71*  LABPROT  --  21.2*  --   --  20.8*  --   --   --  19.0*  INR  --  1.8*  --   --  1.8*  --   --   --  1.6*  CREATININE 5.47*  --   --   --  6.86*  --   --  4.66*  --    < > = values in this interval not displayed.    Estimated Creatinine Clearance: 24.7 mL/min (A) (by C-G formula based on SCr of 4.66 mg/dL (H)).  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  aPTT is therapeutic at 71 seconds on bivalirudin 0.045mg /kg/hr  Plan for iHD 2/12 and evaluate tolerability > then tunneled HD cath > then to po apixaban   Goal of Therapy:  aPTT 50-85 seconds Monitor platelets by anticoagulation protocol: Yes   Plan:  Continue bivalirudin at 0.045 mg/kg/hr (dosing wt 127 kg) F/u daily aPTT and CBC Monitor for signs of bleeding.     Leota Sauers Pharm.D. CPP, BCPS Clinical Pharmacist [phone] 05/17/2023 10:11 AM   Please check AMION for all [facility] Pharmacy numbers 05/17/2023

## 2023-05-17 NOTE — Progress Notes (Signed)
NAME:  Dylan Owen, MRN:  [id number], DOB:  1965/08/17, LOS: 12 ADMISSION DATE:  05/04/2023, CONSULTATION DATE:  05/07/2023 REFERRING MD:  Elvera Lennox - TR[facility], CHIEF COMPLAINT:  Resp failure shock    History of Present Illness:  58 year old male with PMHx significant for obesity, hypertension, hyperlipidemia, type 2 diabetes, sleep apnea, chronic back pain, depression, acid reflux.  Also depression significant home medications include Lasix, hydrochlorothiazide.  Lisinopril, metoprolol, nitroglycerin but also Robaxin and MS Contin 30 mg twice daily along with Repatha.  And Effexor.  Along with insulin for diabetes.  She he walks around with a cane at baseline.  He also admits to medication noncompliance.  And 10 pound weight gain he is not on oxygen at home.  His hemoglobin A1c was 10.2 at admission consistent with very poor compliance.   Admitted on 05/04/2023 with complaints of cough, sore throat, dizziness and bodyaches for 1 week.  Also included chest tightness headaches chills diaphoresis.  At the time of arrival to the ED also had dyspnea on exertion.  At admission BNP was 1275.  Respiratory virus panel was negative.  He was started on Lasix 60 twice daily and his home lisinopril.  He was initially hypertensive and with this management blood pressure improved.  Echocardiogram showed low ejection fraction approximately 25% although poor acoustic windows.  Chest x-ray suggestive of venous congestion.  Cardiology consultation 05/06/2023 noted net -1.6 L since admission and patient was given history of increased frequency of urination.  Diagnose of viral myocarditis was made and left a right heart catheterization was considered.  Troponin barely elevated and consistent with viral myocarditis.   Postadmission with diuresis as of 05/06/2023 creatinine rose up to 1.57 mg percent [baseline 1 mg percent].  At this point lisinopril was held.  811/31/25 developed hypoglycemia requiring infusion of dextrose and  transferred to the intensive care unit.  Post transfer to the intensive care unit he developed acute fever and also BiPAP dependency.  Along with worsening renal failure further to creatinine of 2.83 mg percent along with persistent lactic acidosis around 3.  And mild intermittent delirium.  Overnight diaphoresis reported but despite all this nursing consistently reports that his periphery is extremely cold.   Cardiology has been reconsulted.  Critical care medicine consulted.  Pertinent Medical History:  B12 deficiency, Blood transfusion without reported diagnosis, Chronic low back pain, Depression, Diabetes mellitus with complication (HCC), GERD (gastroesophageal reflux disease), Heart murmur, History of adenomatous polyp of colon (05/22/2021), Hypercholesterolemia, Hypertension, OSA (obstructive sleep apnea), Peripheral neuropathy, Persistent asthma, Seizures (HCC), Stable angina (HCC), Statin intolerance, and Subclinical hypothyroidism (06/2017).    reports that he has never smoked. His smokeless tobacco use includes chew.  Significant Hospital Events: Including procedures, antibiotic start and stop dates in addition to other pertinent events   1/29 Admit to [facility], diuresis 1/30 ECHO w LVEF 25% 1/31 Cards consult, c/f viral myocarditis  2/1 PCCM consult txf to [facility], Adv HF consult  2/2 For HD cath placement and CRRT. Incr D10 for persistent hypogly   2/3 Milrinone stopped. Weaning NE; intubated due to being on bipap for a few days and needing to go to CT  2/5 Extubated. Cleviprex for hypertension   2/6 CT Head for anisocoria, no acute findings. Off clevi. Adding PRN metop. CRRT UF decr to 150/hr.  2/7 On BiPAP. RHC and DCCV  2/8 Off crrt  2/9 Albumin + lasix challenge. HIT neg    Interim History / Subjective:  No significant events overnight Mild  agitation/confusion overnight Tolerated BiPAP all night, feels very well this AM Glucoses remain elevated, Semglee increased yesterday evening  per DM coordinator recommendations Tolerated iHD for clearance but BP too soft to pull much ( ) Nephro making adjustments  Objective:  Blood pressure (!) 107/56, pulse (!) 103, temperature 98.6 F (37 C), temperature source Oral, resp. rate 18, height 6\' 1"  (1.854 m), weight 130 kg, SpO2 (!) 87%. CVP:  [1 mmHg-13 mmHg] 12 mmHg  Vent Mode: BIPAP;PCV FiO2 (%):  [40 %] 40 % Set Rate:  [14 bmp] 14 bmp PEEP:  [5 cmH20] 5 cmH20   Intake/Output Summary (Last 24 hours) at 05/17/2023 0751 Last data filed at 05/17/2023 0700 Gross per 24 hour  Intake 1076.07 ml  Output 450 ml  Net 626.07 ml   Filed Weights   05/16/23 0500 05/16/23 1859 05/17/23 0500  Weight: 125.1 kg 125.1 kg 130 kg   Physical Examination: General: Acutely ill-appearing middle-aged man in NAD. Pleasant and conversant. HEENT: [place]/AT, anicteric sclera, PERRL, moist mucous membranes. Neuro: Awake, oriented x 3-4. Occasionally confused but easily reoriented. Responds to verbal stimuli. Following commands consistently. Moves all 4 extremities spontaneously. Strength 5/5 in all 4 extremities.  CV: Mildly tachycardic, regular rhythm, no m/g/r. PULM: Breathing even and unlabored on 10L [place]. Lung fields with improved air movement today, slightly diminished at bases. GI: Soft, nontender, nondistended. Normoactive bowel sounds. Extremities: Bilateral chronic symmetric 1+ nonpitting LE edema noted. Skin: Warm/dry, multiple abrasions to bilateral shins. Chronic skin changes of ?stasis. Abrasion to nasal bridge.  Resolved Hospital Problem List:   Hypomagnesemia  Assessment & Plan:   Acute metabolic encephalopathy, improving - Correct metabolic derangements, iHD tolerated 2/10 for clearance only - Delirium precautions - Maintain normal sleep/wake cycle as able - Busy activities such as busy board/apron, patient is very task-oriented - Neuroprotective measures: HOB > 30 degrees, normoglycemia, normothermia, electrolytes  WNL  Septic shock -- UTI, multifocal MSSA PNA -- improved shock  Initial concern for cardiogenic shock w HFrEF, possible viral myocarditis but coox et al have been assuring against. Course has been most c/w septic shock, compounded by septic cardiomyopathy + pHTN, sequelae of multisystem organ dysfxn. Cx have not identified an org, however clinically he continued to decline until we broadened to [facility], and turned around pretty rapidly thereafter.  - Goal MAP > 65 - Trend WBC, fever curve - F/u Cx data - S/p mero x 7-day course  Acute hypoxic and hypercarbic resp failure MSSA PNA Hx OSA non on CPAP  - Supplemental O2 support for SpO2 > 90% - BiPAP PRN + at bedtime - Pulmonary hygiene, encourage OOB/mobility - Outpatient sleep evaluation recommended  New Afib/flutter s/p DCCV 2/7 -- now sinus  Acute (possibly on chronic) HFrEF-- septic cardiomyopathy  pHTN (groud II and III) Initial c/f myocarditis but less favored as course/workup went on. Repeat Echo EF 40-45%. RHC with normal R and L filling pressures, elevated PA mean and PVR. - Amiodarone transitioned to PO - Bivalrudin for AC, HIT Ab negative - Cardiac monitoring - Optimize electrolytes for K > 4, Mg > 2 - Encourage NIV compliance - Volume optimization with iHD as tolerated  Thrombocytopenia, improving  HIT ruled out  HIT ab 0.085, WNL. - Continue bival gtt while monitoring for renal recovery - Remain on bival until need for [facility] determined - Consider DOAC once renal function stabilizes  AKI likely ATN AGMA - Nephro following, appreciate assistance - Tolerated iHD 2/10 for clearance, unable to pull fluid due to soft  BP; Nephro planning to make adjustments to see if next session is better tolerated - Trend BMP - Replete electrolytes as indicated - Monitor I&Os - Avoid nephrotoxic agents as able - Ensure adequate renal perfusion  Elevated LFTs, improving mild hyperbilirubinemia mild hyperammonemia  Acute hep neg. Shock  liver most favored. Possible congestive hepatopathy component but if septic CM less favored   - LFTs grossly improved - Intermittent monitoring  DM2 - Basal Semglee 20U nightly - SSI - CBGs ACHS - Goal CBG 140-180  Code status discussion / GOC  Spoke w wife 2/3 who verified Full code/ full scope of practice - Remains full code  Best Practice (right click and "Reselect all SmartList Selections" daily)   Diet/type: Regular consistency (see orders) DVT prophylaxis bival  Pressure ulcer(s): pressure ulcer assessment deferred  GI prophylaxis: PPI Lines:  Central line, Dialysis Catheter, and yes and it is still needed Foley:  Yes, and it is still needed Code Status:  full code Last date of multidisciplinary goals of care discussion [Wife updated via phone 2/11]  Critical care time: N/A   Faythe Ghee [facility] 05/17/23 7:51 AM  Please see [email or web address] for pager details.  From 7A-7P if no response, please call [phone] After hours, please call ELink [phone]

## 2023-05-17 NOTE — Progress Notes (Signed)
Occupational Therapy Treatment Patient Details Name: Dylan Owen MRN: [id number] DOB: 15-Jun-1965 Today's Date: 05/17/2023   History of present illness Pt is 58 yo presenting to [facility] as transfer from [facility] 2/1 due to mixed septic and cardiogenic shock. Pt presented to [facility] hospital due to referral from MD on 05/05/23. Pt started on CRRT on 2/2, Intubated on 2/3 due to constant BiPAP use and unable to wean. Intubation was discontinued on 2/5. PMH: HTN, hyperlipidemia, DM II, sleep apnea, chronic back pain.   OT comments  Pt making gradual progress towards OT goals though remains limited by deficits in endurance, cognition and balance. Pt requires Mod A x 2 to stand but once up, able to mobilize with Min A (+2 for chair follow). Pt with DOE, unable to stand for ADLs at sink and unable to mobilize as far as yesterday's session w/ challenge of RW trial today. Pt hopeful to progress quickly with intensive rehab services.   SPO2 92% on 6 L O2 with activity, > 96% at rest on 6-8 L O2      If plan is discharge home, recommend the following:  Two people to help with walking and/or transfers;A lot of help with bathing/dressing/bathroom;Assistance with cooking/housework;Assistance with feeding;Direct supervision/assist for medications management;Direct supervision/assist for financial management;Assist for transportation;Help with stairs or ramp for entrance;Supervision due to cognitive status   Equipment Recommendations  Other (comment) (RW; TBD)    Recommendations for Other Services      Precautions / Restrictions Precautions Precautions: Fall Precaution/Restrictions Comments: watch SpO2 Restrictions Weight Bearing Restrictions Per Provider Order: No       Mobility Bed Mobility Overal bed mobility: Needs Assistance Bed Mobility: Supine to Sit     Supine to sit: Min assist     General bed mobility comments: light assist to scoot to EOB    Transfers Overall transfer level: Needs  assistance Equipment used: Rolling walker (2 wheels) Transfers: Sit to/from Stand Sit to Stand: Mod assist, +2 safety/equipment, +2 physical assistance           General transfer comment: Mod A x 2 to stand with RW from bedside and recliner. cues to push from armrests and cues to place hands on RW rather than attempting to rest elbows on the DME like the Eva walker     Balance Overall balance assessment: Needs assistance Sitting-balance support: Feet supported, No upper extremity supported Sitting balance-Leahy Scale: Fair     Standing balance support: Bilateral upper extremity supported, Reliant on assistive device for balance Standing balance-Leahy Scale: Poor                             ADL either performed or assessed with clinical judgement   ADL Overall ADL's : Needs assistance/impaired     Grooming: Set up;Sitting;Wash/dry face Grooming Details (indicate cue type and reason): seated at sink. pt reported unable to stand long enough to complete task     Lower Body Bathing: Maximal assistance;Sit to/from stand Lower Body Bathing Details (indicate cue type and reason): assist for posterior hygiene standing at bedside (NT present) Upper Body Dressing : Minimal assistance;Sitting                   Functional mobility during ADLs: Minimal assistance;Rolling walker (2 wheels);+2 for safety/equipment General ADL Comments: NTs present, hopeful to attempt bathing standing/seated at sink + short walk though pt quickly fatigued with trial of RW rather than [employer]ley Hammed walker  Extremity/Trunk Assessment Upper Extremity Assessment Upper Extremity Assessment: Right hand dominant;Overall [facility] for tasks assessed RUE Deficits / Details: strength and ROM WFL; decreased proprioception; decreased coordination RUE Sensation: decreased proprioception RUE Coordination: decreased fine motor LUE Deficits / Details: strength and ROM WFL; decreased proprioception; decreased  coordination LUE Sensation: decreased proprioception LUE Coordination: decreased fine motor   Lower Extremity Assessment Lower Extremity Assessment: Defer to PT evaluation        Vision   Vision Assessment?: No apparent visual deficits   Perception     Praxis     Communication Communication Communication: No apparent difficulties   Cognition Arousal: Alert Behavior During Therapy: WFL for tasks assessed/performed, Impulsive Cognition: Cognition impaired     Awareness: Online awareness impaired, Intellectual awareness intact Memory impairment (select all impairments): Working [employer] fast streamer, Short-term memory Attention impairment (select first level of impairment): Sustained attention Executive functioning impairment (select all impairments): Sequencing, Organization, Problem solving OT - Cognition Comments: pleasant, easily distractible and tangential with conversation requiring redirection throughout. cues for safety with DME use                 Following commands: Intact        Cueing   Cueing Techniques: Verbal cues, Gestural cues, Visual cues  Exercises      Shoulder Instructions       General Comments nasal canula falling out of nose often with SpO2 > 96%. on 8 L at start of session, able to tolerate 6 L O2 with mobility at 92% though DOE noted. Placed back on the 8 L when done    Pertinent Vitals/ Pain       Pain Assessment Pain Assessment: No/denies pain  Home Living                                          Prior Functioning/Environment              Frequency  Min 1X/week        Progress Toward Goals  OT Goals(current goals can now be found in the care plan section)     Acute Rehab OT Goals Patient Stated Goal: go to rehab here, lose some weight OT Goal Formulation: With patient Time For Goal Achievement: 05/26/23 Potential to Achieve Goals: Good  Plan      Co-evaluation                 AM-PAC OT "6  Clicks" Daily Activity     Outcome Measure   Help from another person eating meals?: A Little Help from another person taking care of personal grooming?: A Little Help from another person toileting, which includes using toliet, bedpan, or urinal?: Total Help from another person bathing (including washing, rinsing, drying)?: A Lot Help from another person to put on and taking off regular upper body clothing?: A Little Help from another person to put on and taking off regular lower body clothing?: Total 6 Click Score: 13    End of Session Equipment Utilized During Treatment: Oxygen;Gait belt;Rolling walker (2 wheels)  OT Visit Diagnosis: Ataxia, unspecified (R27.0);Other symptoms and signs involving cognitive function;Other (comment)   Activity Tolerance Patient tolerated treatment well   Patient Left with call bell/phone within reach;in chair   Nurse Communication Mobility status;Other (comment) (O2)        Time: 1610-9604 OT Time Calculation (min): 33 min  Charges: OT General  Charges $OT Visit: 1 Visit OT Treatments $Self Care/Home Management : 8-22 mins $Therapeutic Activity: 8-22 mins  Bradd Canary, OTR/L Acute Rehab Services Office: [phone]   Lorre Munroe 05/17/2023, 12:39 PM

## 2023-05-18 DIAGNOSIS — I5043 Acute on chronic combined systolic (congestive) and diastolic (congestive) heart failure: Secondary | ICD-10-CM | POA: Diagnosis not present

## 2023-05-18 DIAGNOSIS — I959 Hypotension, unspecified: Secondary | ICD-10-CM

## 2023-05-18 DIAGNOSIS — D649 Anemia, unspecified: Secondary | ICD-10-CM

## 2023-05-18 DIAGNOSIS — R739 Hyperglycemia, unspecified: Secondary | ICD-10-CM

## 2023-05-18 DIAGNOSIS — I5031 Acute diastolic (congestive) heart failure: Secondary | ICD-10-CM

## 2023-05-18 DIAGNOSIS — K219 Gastro-esophageal reflux disease without esophagitis: Secondary | ICD-10-CM

## 2023-05-18 LAB — RENAL FUNCTION PANEL
Albumin: 2.1 g/dL — ABNORMAL LOW (ref 3.5–5.0)
Anion gap: 13 (ref 5–15)
BUN: 84 mg/dL — ABNORMAL HIGH (ref 6–20)
CO2: 22 mmol/L (ref 22–32)
Calcium: 8.1 mg/dL — ABNORMAL LOW (ref 8.9–10.3)
Chloride: 95 mmol/L — ABNORMAL LOW (ref 98–111)
Creatinine, Ser: 6.31 mg/dL — ABNORMAL HIGH (ref 0.61–1.24)
GFR, Estimated: 10 mL/min — ABNORMAL LOW (ref >60)
Glucose, Bld: 313 mg/dL — ABNORMAL HIGH (ref 70–99)
Phosphorus: 4.2 mg/dL (ref 2.5–4.6)
Potassium: 4.4 mmol/L (ref 3.5–5.1)
Sodium: 130 mmol/L — ABNORMAL LOW (ref 135–145)

## 2023-05-18 LAB — APTT: aPTT: 69 s — ABNORMAL HIGH (ref 24–36)

## 2023-05-18 LAB — COOXEMETRY PANEL
Carboxyhemoglobin: 2.9 % — ABNORMAL HIGH (ref 0.5–1.5)
Methemoglobin: <0.7 % (ref 0.0–1.5)
O2 Saturation: 88.4 %
Total hemoglobin: 11.6 g/dL — ABNORMAL LOW (ref 12.0–16.0)

## 2023-05-18 LAB — CBC
HCT: 35.5 % — ABNORMAL LOW (ref 39.0–52.0)
Hemoglobin: 11.6 g/dL — ABNORMAL LOW (ref 13.0–17.0)
MCH: 26 pg (ref 26.0–34.0)
MCHC: 32.7 g/dL (ref 30.0–36.0)
MCV: 79.4 fL — ABNORMAL LOW (ref 80.0–100.0)
Platelets: 205 10*3/uL (ref 150–400)
RBC: 4.47 MIL/uL (ref 4.22–5.81)
RDW: 18.1 % — ABNORMAL HIGH (ref 11.5–15.5)
WBC: 14.7 10*3/uL — ABNORMAL HIGH (ref 4.0–10.5)
nRBC: 0 % (ref 0.0–0.2)

## 2023-05-18 LAB — GLUCOSE, CAPILLARY
Glucose-Capillary: 383 mg/dL — ABNORMAL HIGH (ref 70–99)
Glucose-Capillary: 156 mg/dL — ABNORMAL HIGH (ref 70–99)
Glucose-Capillary: 284 mg/dL — ABNORMAL HIGH (ref 70–99)
Glucose-Capillary: 307 mg/dL — ABNORMAL HIGH (ref 70–99)

## 2023-05-18 LAB — PROTIME-INR
INR: 1.5 — ABNORMAL HIGH (ref 0.8–1.2)
Prothrombin Time: 18.4 s — ABNORMAL HIGH (ref 11.4–15.2)

## 2023-05-18 LAB — MAGNESIUM: Magnesium: 2.2 mg/dL (ref 1.7–2.4)

## 2023-05-18 MED ORDER — AMIODARONE HCL 200 MG PO TABS
200.0000 mg | ORAL_TABLET | Freq: Two times a day (BID) | ORAL | Status: DC
  Administered 2023-05-18 – 2023-05-24 (×12): 200 mg via ORAL
  Filled 2023-05-18 (×13): qty 1

## 2023-05-18 MED ORDER — FUROSEMIDE 10 MG/ML IJ SOLN
120.0000 mg | Freq: Two times a day (BID) | INTRAVENOUS | Status: DC
  Administered 2023-05-18 – 2023-05-19 (×4): 120 mg via INTRAVENOUS
  Filled 2023-05-18: qty 2
  Filled 2023-05-18 (×3): qty 10
  Filled 2023-05-18 (×2): qty 12

## 2023-05-18 MED ORDER — INSULIN GLARGINE-YFGN 100 UNIT/ML ~~LOC~~ SOLN
20.0000 [IU] | Freq: Two times a day (BID) | SUBCUTANEOUS | Status: DC
  Administered 2023-05-18 – 2023-05-19 (×3): 20 [IU] via SUBCUTANEOUS
  Filled 2023-05-18 (×6): qty 0.2

## 2023-05-18 MED ORDER — INSULIN ASPART 100 UNIT/ML IJ SOLN: 0.0000 [IU] | Freq: Three times a day (TID) | INTRAMUSCULAR | Status: DC

## 2023-05-18 MED ORDER — SODIUM CHLORIDE 0.9 % IV SOLN
INTRAVENOUS | Status: AC | PRN
Start: 2023-05-18 — End: 2023-05-19
  Administered 2023-05-18: 250 mL via INTRAVENOUS

## 2023-05-18 MED ORDER — INSULIN ASPART 100 UNIT/ML IJ SOLN
6.0000 [IU] | Freq: Three times a day (TID) | INTRAMUSCULAR | Status: DC
  Administered 2023-05-19: 6 [IU] via SUBCUTANEOUS

## 2023-05-18 MED ORDER — INSULIN ASPART 100 UNIT/ML IJ SOLN
0.0000 [IU] | Freq: Three times a day (TID) | INTRAMUSCULAR | Status: DC
  Administered 2023-05-18: 4 [IU] via SUBCUTANEOUS
  Administered 2023-05-18: 20 [IU] via SUBCUTANEOUS
  Administered 2023-05-18: 11 [IU] via SUBCUTANEOUS
  Administered 2023-05-19: 7 [IU] via SUBCUTANEOUS

## 2023-05-18 MED ORDER — INSULIN GLARGINE-YFGN 100 UNIT/ML ~~LOC~~ SOLN
25.0000 [IU] | Freq: Every day | SUBCUTANEOUS | Status: DC
  Filled 2023-05-18: qty 0.25

## 2023-05-18 NOTE — Inpatient Diabetes Management (Signed)
Inpatient Diabetes Program Recommendations  AACE/ADA: New Consensus Statement on Inpatient Glycemic Control (2015)  Target Ranges:  Prepandial:   less than 140 mg/dL      Peak postprandial:   less than 180 mg/dL (1-2 hours)      Critically ill patients:  140 - 180 mg/dL   Lab Results  Component Value Date   GLUCAP 383 (H) 05/18/2023   HGBA1C 10.2 (H) 05/05/2023    Review of Glycemic Control  Latest Reference Range & Units 05/17/23 07:43 05/17/23 12:14 05/17/23 16:07 05/17/23 21:53 05/18/23 06:41  Glucose-Capillary 70 - 99 mg/dL 469 (H) 98 629 (H) 528 (H) 383 (H)   Diabetes history: DM 2 Outpatient Diabetes medications:  Tresiba 80 units q AM and 72 units q PM Novolog 3 units tid with meals Freestyle Libre 3 Current orders for Inpatient glycemic control:  Novolog 0-20 units tid with meals and HS Novolog 4 units tid with meals (meal coverage) Semglee 25 units q HS  Inpatient Diabetes Program Recommendations:    Consider increasing Semglee to 20 units bid.    Thanks,  Lorenza Cambridge, RN, BC-ADM Inpatient Diabetes Coordinator Pager [phone]  (8a-5p)

## 2023-05-18 NOTE — Progress Notes (Addendum)
PHARMACY - ANTICOAGULATION Pharmacy Consult for bivalirudin Indication: Atrial flutter  Allergies  Allergen Reactions   Ace Inhibitors     Kidney failure   Atorvastatin Other (See Comments)    Arm pain   Prednisone     REACTION: Anxiety   Septra [Sulfamethoxazole-Trimethoprim]    Metformin And Related Diarrhea    Patient Measurements: Height: 6\' 1"  (185.4 cm) Weight: 131.6 kg (290 lb 2 oz) IBW/kg (Calculated) : 79.9  Vital Signs: Temp: 98.7 F (37.1 C) (02/12 0600) Temp Source: Oral (02/12 0600) BP: 121/68 (02/12 0800) Pulse Rate: 102 (02/12 0800)  Labs: Recent Labs    05/16/23 0335 05/16/23 1303 05/16/23 1523 05/17/23 0428 05/17/23 0843 05/18/23 0540  HGB 13.4  --   --  12.1*  --  11.6*  HCT 40.2  --   --  36.3*  --  35.5*  PLT 172  --   --  174  --  205  APTT 86*   < > 70*  --  71* 69*  LABPROT 20.8*  --   --   --  19.0* 18.4*  INR 1.8*  --   --   --  1.6* 1.5*  CREATININE 6.86*  --   --  4.66*  --  6.31*   < > = values in this interval not displayed.    Estimated Creatinine Clearance: 18.4 mL/min (A) (by C-G formula based on SCr of 6.31 mg/dL (H)).  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  aPTT is therapeutic at 69 seconds. H/H, pltc ok, HIT negative. Planning DOAC once tunneled HD line placed.  Goal of Therapy:  aPTT 50-85 seconds Monitor platelets by anticoagulation protocol: Yes   Plan:  Continue bivalirudin at 0.045 mg/kg/hr (dosing wt 127 kg) >> hold this morning with plans for tunneled HD line, will plan for DOAC tonight after placement Daily aPTT, CBC  Fredonia Highland, PharmD, BCPS, [facility] Clinical Pharmacist [phone] Please check AMION for all [facility] Pharmacy numbers 05/18/2023

## 2023-05-18 NOTE — Progress Notes (Signed)
Occupational Therapy Treatment Patient Details Name: Dylan Owen MRN: [id number] DOB: 01/10/1966 Today's Date: 05/18/2023   History of present illness Pt is 58 yo presenting to [facility] as transfer from [facility] 2/1 due to mixed septic and cardiogenic shock. Pt presented to [facility] hospital due to referral from MD on 05/05/23. Pt started on CRRT on 2/2-2/8, transitioned to HD. Intubated on 2/3 due to constant BiPAP use and unable to wean. Intubation was discontinued on 2/5. PMH: HTN, hyperlipidemia, DM II, sleep apnea, chronic back pain.   OT comments  Pt up in chair and eager to work with therapies. Moderate assist for UB dressing, max to don shoes. Pt able to stand with min assist and use of his rollator from home and ambulate with chair follow. Pt with increased endurance with ambulation, needing intermittent seated rest breaks. Heavily dependent on UE support on rollator. SpO2 95% on 6L, HR to 124 with ambulation. Patient will benefit from intensive inpatient follow-up therapy, >3 hours/day       If plan is discharge home, recommend the following:  Two people to help with walking and/or transfers;A lot of help with bathing/dressing/bathroom;Assistance with cooking/housework;Assistance with feeding;Direct supervision/assist for medications management;Direct supervision/assist for financial management;Assist for transportation;Help with stairs or ramp for entrance;Supervision due to cognitive status   Equipment Recommendations  Tub/shower bench (wide rollator)    Recommendations for Other Services      Precautions / Restrictions Precautions Precautions: Fall Precaution/Restrictions Comments: watch SpO2/HR Restrictions Weight Bearing Restrictions Per Provider Order: No       Mobility Bed Mobility               General bed mobility comments: received in chair    Transfers Overall transfer level: Needs assistance Equipment used: Rollator (4 wheels) Transfers: Sit to/from Stand Sit to  Stand: Min assist, +2 safety/equipment           General transfer comment: Cues for hand placement, increased time, use of momentum     Balance Overall balance assessment: Needs assistance   Sitting balance-Leahy Scale: Good Sitting balance - Comments: no LOB bending over to adjust shoe   Standing balance support: Bilateral upper extremity supported, Reliant on assistive device for balance Standing balance-Leahy Scale: Poor Standing balance comment: heavily dependent on UEs on home rollator                           ADL either performed or assessed with clinical judgement   ADL Overall ADL's : Needs assistance/impaired                 Upper Body Dressing : Moderate assistance;Sitting Upper Body Dressing Details (indicate cue type and reason): front opening gown Lower Body Dressing: Maximal assistance;Sitting/lateral leans Lower Body Dressing Details (indicate cue type and reason): shoes             Functional mobility during ADLs: Minimal assistance;+2 for safety/equipment;Rollator (4 wheels)      Extremity/Trunk Assessment              Vision       Perception     Praxis     Communication     Cognition Arousal: Alert Behavior During Therapy: WFL for tasks assessed/performed Cognition: Cognition impaired     Awareness: Online awareness impaired, Intellectual awareness intact Memory impairment (select all impairments): Working [employer] fast streamer, Short-term memory Attention impairment (select first level of impairment): Sustained attention Executive functioning impairment (select all impairments): Sequencing, Organization,  Problem solving OT - Cognition Comments: pleasant, easily distractible and tangential with conversation requiring redirection throughout. cues for safety with DME use, began self cuing for hand placement as session continued                 Following commands: Intact        Cueing      Exercises      Shoulder  Instructions       General Comments      Pertinent Vitals/ Pain       Pain Assessment Pain Assessment: Faces Faces Pain Scale: Hurts a little bit Pain Location: bottom Pain Descriptors / Indicators: Sore Pain Intervention(s): Repositioned, Monitored during session  Home Living                                          Prior Functioning/Environment              Frequency  Min 1X/week        Progress Toward Goals  OT Goals(current goals can now be found in the care plan section)  Progress towards OT goals: Progressing toward goals  Acute Rehab OT Goals OT Goal Formulation: With patient Time For Goal Achievement: 05/26/23 Potential to Achieve Goals: Good  Plan      Co-evaluation    PT/OT/SLP Co-Evaluation/Treatment: Yes Reason for Co-Treatment: For patient/therapist safety   OT goals addressed during session: ADL's and self-care;Strengthening/ROM      AM-PAC OT "6 Clicks" Daily Activity     Outcome Measure   Help from another person eating meals?: None Help from another person taking care of personal grooming?: A Little Help from another person toileting, which includes using toliet, bedpan, or urinal?: Total Help from another person bathing (including washing, rinsing, drying)?: A Lot Help from another person to put on and taking off regular upper body clothing?: A Little Help from another person to put on and taking off regular lower body clothing?: Total 6 Click Score: 14    End of Session Equipment Utilized During Treatment: Gait belt;Oxygen;Rollator (4 wheels) (6L)  OT Visit Diagnosis: Unsteadiness on feet (R26.81);Other abnormalities of gait and mobility (R26.89);Muscle weakness (generalized) (M62.81);Other symptoms and signs involving cognitive function   Activity Tolerance Patient tolerated treatment well   Patient Left in chair;with call bell/phone within reach   Nurse Communication Mobility status        Time:  4098-1191 OT Time Calculation (min): 31 min  Charges: OT General Charges $OT Visit: 1 Visit OT Treatments $Self Care/Home Management : 8-22 mins Berna Spare, OTR/L Acute Rehabilitation Services Office: [phone]   Evern Bio 05/18/2023, 11:31 AM

## 2023-05-18 NOTE — Progress Notes (Addendum)
PHARMACY - ANTICOAGULATION Pharmacy Consult for bivalirudin Indication: Atrial flutter  Allergies  Allergen Reactions   Ace Inhibitors     Kidney failure   Atorvastatin Other (See Comments)    Arm pain   Prednisone     REACTION: Anxiety   Septra [Sulfamethoxazole-Trimethoprim]    Metformin And Related Diarrhea    Patient Measurements: Height: 6\' 1"  (185.4 cm) Weight: 131.6 kg (290 lb 2 oz) IBW/kg (Calculated) : 79.9  Vital Signs: Temp: 97.8 F (36.6 C) (02/12 1630) Temp Source: Axillary (02/12 1630) BP: 98/56 (02/12 1600) Pulse Rate: 102 (02/12 1600)  Labs: Recent Labs    05/16/23 0335 05/16/23 1303 05/16/23 1523 05/17/23 0428 05/17/23 0843 05/18/23 0540  HGB 13.4  --   --  12.1*  --  11.6*  HCT 40.2  --   --  36.3*  --  35.5*  PLT 172  --   --  174  --  205  APTT 86*   < > 70*  --  71* 69*  LABPROT 20.8*  --   --   --  19.0* 18.4*  INR 1.8*  --   --   --  1.6* 1.5*  CREATININE 6.86*  --   --  4.66*  --  6.31*   < > = values in this interval not displayed.    Estimated Creatinine Clearance: 18.4 mL/min (A) (by C-G formula based on SCr of 6.31 mg/dL (H)).  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  Unable to get tunneled HD line today - discussed with CCM and plan to hold on line placement tonight and will reassess in AM. Okay to restart bivalirudin. aPTT was therapeutic at 69 seconds earlier prior to being held. H/H, pltc ok, HIT negative.  Goal of Therapy:  aPTT 50-85 seconds Monitor platelets by anticoagulation protocol: Yes   Plan:  Restart bivalirudin at 0.045 mg/kg/hr (dosing wt 127 kg)  Daily aPTT, CBC F/u line access plans in AM   Thank you for allowing pharmacy to participate in this patient's care,  Sherron Monday, PharmD, BCCCP Clinical Pharmacist  Phone: [phone] 05/18/2023 5:04 PM  Please check AMION for all [facility] Pharmacy phone numbers After 10:00 PM, call Main Pharmacy [phone]

## 2023-05-18 NOTE — Plan of Care (Signed)
  Problem: Coping: Goal: Ability to adjust to condition or change in health will improve Outcome: Progressing   Problem: Health Behavior/Discharge Planning: Goal: Ability to identify and utilize available resources and services will improve Outcome: Progressing Goal: Ability to manage health-related needs will improve Outcome: Progressing   Problem: Nutritional: Goal: Maintenance of adequate nutrition will improve Outcome: Progressing Goal: Progress toward achieving an optimal weight will improve Outcome: Progressing   Problem: Skin Integrity: Goal: Risk for impaired skin integrity will decrease Outcome: Progressing   Problem: Tissue Perfusion: Goal: Adequacy of tissue perfusion will improve Outcome: Progressing   Problem: Education: Goal: Knowledge of General Education information will improve Description: Including pain rating scale, medication(s)/side effects and non-pharmacologic comfort measures Outcome: Progressing   Problem: Health Behavior/Discharge Planning: Goal: Ability to manage health-related needs will improve Outcome: Progressing   Problem: Clinical Measurements: Goal: Ability to maintain clinical measurements within normal limits will improve Outcome: Progressing Goal: Will remain free from infection Outcome: Progressing Goal: Diagnostic test results will improve Outcome: Progressing Goal: Cardiovascular complication will be avoided Outcome: Progressing   Problem: Activity: Goal: Risk for activity intolerance will decrease Outcome: Progressing   Problem: Nutrition: Goal: Adequate nutrition will be maintained Outcome: Progressing   Problem: Coping: Goal: Level of anxiety will decrease Outcome: Progressing   Problem: Pain Managment: Goal: General experience of comfort will improve and/or be controlled Outcome: Progressing   Problem: Safety: Goal: Ability to remain free from injury will improve Outcome: Progressing   Problem: Skin  Integrity: Goal: Risk for impaired skin integrity will decrease Outcome: Progressing   Problem: Education: Goal: Knowledge of General Education information will improve Description: Including pain rating scale, medication(s)/side effects and non-pharmacologic comfort measures Outcome: Progressing   Problem: Health Behavior/Discharge Planning: Goal: Ability to manage health-related needs will improve Outcome: Progressing   Problem: Clinical Measurements: Goal: Ability to maintain clinical measurements within normal limits will improve Outcome: Progressing Goal: Will remain free from infection Outcome: Progressing Goal: Diagnostic test results will improve Outcome: Progressing Goal: Respiratory complications will improve Outcome: Progressing Goal: Cardiovascular complication will be avoided Outcome: Progressing   Problem: Activity: Goal: Risk for activity intolerance will decrease Outcome: Progressing   Problem: Nutrition: Goal: Adequate nutrition will be maintained Outcome: Progressing   Problem: Coping: Goal: Level of anxiety will decrease Outcome: Progressing   Problem: Pain Managment: Goal: General experience of comfort will improve and/or be controlled Outcome: Progressing   Problem: Safety: Goal: Ability to remain free from injury will improve Outcome: Progressing   Problem: Skin Integrity: Goal: Risk for impaired skin integrity will decrease Outcome: Progressing   Problem: Education: Goal: Knowledge of disease and its progression will improve Outcome: Progressing   Problem: Health Behavior/Discharge Planning: Goal: Ability to manage health-related needs will improve Outcome: Progressing   Problem: Activity: Goal: Activity intolerance will improve Outcome: Progressing   Problem: Respiratory: Goal: Respiratory symptoms related to disease process will be avoided Outcome: Progressing   Problem: Self-Concept: Goal: Body image disturbance will be  avoided or minimized Outcome: Progressing   Problem: Education: Goal: Understanding of CV disease, CV risk reduction, and recovery process will improve Outcome: Progressing   Problem: Activity: Goal: Ability to return to baseline activity level will improve Outcome: Progressing   Problem: Cardiovascular: Goal: Ability to achieve and maintain adequate cardiovascular perfusion will improve Outcome: Progressing Goal: Vascular access site(s) Level 0-1 will be maintained Outcome: Progressing   Problem: Health Behavior/Discharge Planning: Goal: Ability to safely manage health-related needs after discharge will improve Outcome: Progressing

## 2023-05-18 NOTE — Progress Notes (Signed)
Physical Therapy Treatment Patient Details Name: Dylan Owen MRN: [id number] DOB: 28-Jul-1965 Today's Date: 05/18/2023   History of Present Illness Pt is 58 yo presenting to [facility] as transfer from [facility] 2/1 due to mixed septic and cardiogenic shock. Pt presented to [facility] hospital due to referral from MD on 05/05/23. Pt started on CRRT on 2/2-2/8, transitioned to HD. Intubated on 2/3 due to constant BiPAP use and unable to wean. Intubation was discontinued on 2/5. On BIPAP on 2/7 then off BiPAP. CRRT discontinued on 2/8 PMH: HTN, hyperlipidemia, DM II, sleep apnea, chronic back pain.    PT Comments  Pt continues to progress mobility. Pt was able to ambulate 240 ft with intermittent seated rest breaks for shortness of breathe. Pt was Min A +2 for sit to stand and gait with chair follow. Pt requires cueing for safe hand placement. Due to pt current functional status, home set up and available assistance at home recommending skilled physical therapy services > 3 hours/day in order to address strength, balance and functional mobility to decrease risk for falls, injury, immobility, skin break down and re-hospitalization.      If plan is discharge home, recommend the following: A lot of help with walking and/or transfers;Assistance with cooking/housework;Assist for transportation     Equipment Recommendations  BSC/3in1;Rollator (4 wheels)       Precautions / Restrictions Precautions Precautions: Fall Precaution/Restrictions Comments: watch SpO2/HR Restrictions Weight Bearing Restrictions Per Provider Order: No     Mobility  Bed Mobility   General bed mobility comments: received in chair    Transfers Overall transfer level: Needs assistance Equipment used: Rollator (4 wheels) Transfers: Sit to/from Stand Sit to Stand: Min assist, +2 safety/equipment           General transfer comment: Cues for hand placement, increased time, use of momentum    Ambulation/Gait Ambulation/Gait  assistance: Min assist, +2 physical assistance, +2 safety/equipment Gait Distance (Feet): 60 Feet (4x) Assistive device: Rollator (4 wheels) Gait Pattern/deviations: Step-through pattern, Decreased stride length, Wide base of support Gait velocity: decreased Gait velocity interpretation: <1.31 ft/sec, indicative of household ambulator   General Gait Details: L foot in slight plantarflexion. Pt has moderate use of UE on rollator. Intermittent seated rest breaks due to shortness of breathe with cues for pursed lipped breathine.       Balance Overall balance assessment: Needs assistance Sitting-balance support: Feet supported, No upper extremity supported Sitting balance-Leahy Scale: Good Sitting balance - Comments: no LOB bending over to adjust shoe   Standing balance support: Bilateral upper extremity supported, Reliant on assistive device for balance Standing balance-Leahy Scale: Fair Standing balance comment: heavily dependent on UEs on home rollator, no overt LOB           Cognition Arousal: Alert Behavior During Therapy: WFL for tasks assessed/performed   PT - Cognitive impairments: Memory, Safety/Judgement     Following commands: Intact      Cueing Cueing Techniques: Verbal cues, Tactile cues     General Comments General comments (skin integrity, edema, etc.): O2 sats 58% during ambulation with poor pleth line. With sitting and improved reading up to 94%. HR up to 124 bpm during gait with shortness of breathe. Would decrease to 98 bpm during rest breaks      Pertinent Vitals/Pain Pain Assessment Pain Assessment: Faces Faces Pain Scale: Hurts a little bit Facial Expression: Relaxed, neutral Body Movements: Absence of movements Muscle Tension: Relaxed Compliance with ventilator (intubated pts.): Tolerating ventilator or movement Vocalization (extubated pts.):  N/A CPOT Total: 0 Pain Location: bottom Pain Descriptors / Indicators: Sore Pain Intervention(s):  Monitored during session, Repositioned     PT Goals (current goals can now be found in the care plan section) Acute Rehab PT Goals Patient Stated Goal: to improve mobility PT Goal Formulation: With patient Time For Goal Achievement: 05/25/23 Potential to Achieve Goals: Good Progress towards PT goals: Progressing toward goals    Frequency    Min 1X/week      PT Plan  Continue with current POC     Co-evaluation PT/OT/SLP Co-Evaluation/Treatment: Yes Reason for Co-Treatment: For patient/therapist safety PT goals addressed during session: Mobility/safety with mobility OT goals addressed during session: ADL's and self-care;Strengthening/ROM      AM-PAC PT "6 Clicks" Mobility   Outcome Measure  Help needed turning from your back to your side while in a flat bed without using bedrails?: A Little Help needed moving from lying on your back to sitting on the side of a flat bed without using bedrails?: A Little Help needed moving to and from a bed to a chair (including a wheelchair)?: A Little Help needed standing up from a chair using your arms (e.g., wheelchair or bedside chair)?: A Little Help needed to walk in hospital room?: A Lot Help needed climbing 3-5 steps with a railing? : Total 6 Click Score: 15    End of Session Equipment Utilized During Treatment: Gait belt;Oxygen Activity Tolerance: Patient tolerated treatment well Patient left: with call bell/phone within reach;in chair Nurse Communication: Mobility status PT Visit Diagnosis: Unsteadiness on feet (R26.81);Other abnormalities of gait and mobility (R26.89);Muscle weakness (generalized) (M62.81)     Time: 1610-9604 PT Time Calculation (min) (ACUTE ONLY): 31 min  Charges:    $Gait Training: 8-22 mins $Therapeutic Activity: 8-22 mins PT General Charges $$ ACUTE PT VISIT: 1 Visit                    Harrel Carina, DPT, CLT  Acute Rehabilitation Services Office: [phone] (Secure chat  preferred)    Claudia Desanctis 05/18/2023, 11:55 AM

## 2023-05-18 NOTE — PMR Pre-admission (Shared)
PMR Admission Coordinator Pre-Admission Assessment  Patient: Dylan Owen is an 58 y.o., male MRN: [id number] DOB: July 05, 1965 Height: 6\' 1"  (185.4 cm) Weight: 131.6 kg  Insurance Information HMO:     PPO:    yes  PCP:      IPA:      80/20:      OTHER:  PRIMARY: [employer] PPO      Policy#: ***      Subscriber: *** CM Name: ***      Phone#: ***     Fax#: *** Pre-Cert#: ***      Employer: *** Benefits:  Phone #: ***     Name: *** Eff. Date: ***     Deduct: ***      Out of Pocket Max: ***      Life Max: *** CIR: ***      SNF: *** Outpatient: ***     Co-Pay: *** Home Health: ***      Co-Pay: *** DME: ***     Co-Pay: *** Providers: *** SECONDARY:       Policy#:      Phone#:   Financial Counselor:       Phone#:   The "Data Collection Information Summary" for patients in Inpatient Rehabilitation Facilities with attached "Privacy Act Statement-Health Care Records" was provided and verbally reviewed with: Patient and Family  Emergency Contact Information Contact Information     Name Relation Home Work Mobile   Dylan Owen Spouse [phone]  [phone]      Other Contacts   None on File     Current Medical History  Patient Admitting Diagnosis: Heart Failure, Renal Failure, Respiratory Failure  History of Present Illness: Dylan Owen is a 58 y.o. male with a history of obesity, diabetes who was admitted  to [facility] ED on 05/05/2023 with mixed sepsis  and cardiogenic shock. Clinically suspected to be associated with viral myocarditis/acute CHF exacerbation.  Patient developed delirium and worsening respiratory failure and was intubated on 05/09/2023.  He was extubated on 05/11/2023 with general improvement noted.  Cortrak tube was placed for nutrition, then transitioned to PO intake.  Course complicated by acute kidney injury requiring CRRT 2/2-2/8 and then hemodialysis for management. Pt. Was seen by PT/OT/SLP and they recommend CIR to assist  return to PLOF.  Complete NIHSS TOTAL: 9  Patient's medical record from [facility] has been reviewed by the rehabilitation admission coordinator and physician.  Past Medical History  Past Medical History:  Diagnosis Date   B12 deficiency    Blood transfusion without reported diagnosis    Chronic low back pain    Dr. Vear Clock is pain mgmt MD   Depression    Diabetes mellitus with complication (HCC)    DPN + microalbuminuria   GERD (gastroesophageal reflux disease)    Heart murmur    History of adenomatous polyp of colon 05/22/2021   recall 7 yrs   Hypercholesterolemia    Statin intolerant.  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 Yes  Family History   family history includes Cancer in his paternal grandfather; Heart disease in an other family member; Hypertension in an other family member; Other in an other family member.  Current Medications  Current Facility-Administered Medications:    0.9 %  sodium chloride infusion, , Intravenous, PRN, Bufford Buttner, MD, Last Rate: 10 mL/hr at 05/18/23 1400, Infusion Verify at 05/18/23 1400   acetaminophen (TYLENOL) 160 MG/5ML solution 650 mg, 650 mg, Oral, Q6H PRN, Cheri Fowler, MD   albuterol (PROVENTIL) (2.5 MG/3ML) 0.083% nebulizer solution 2.5 mg, 2.5 mg, Inhalation, Q6H PRN, Eduard Clos, MD   alteplase (CATHFLO ACTIVASE) injection 2 mg, 2 mg, Intracatheter, Once PRN, Dagoberto Ligas, MD   amiodarone (PACERONE) tablet 200 mg, 200 mg, Oral, BID, Laurey Morale, MD    anticoagulant sodium citrate solution 5 mL, 5 mL, Intracatheter, PRN, Dagoberto Ligas, MD   aspirin EC tablet 81 mg, 81 mg, Oral, Daily, Cheri Fowler, MD, 81 mg at 05/18/23 0945   bacitracin ointment, , Topical, BID, Cheri Fowler, MD, 31.5 Application at 05/18/23 1121   bivalirudin (ANGIOMAX) 250 mg in sodium chloride 0.9 % 500 mL (0.5 mg/mL) infusion, 0.045 mg/kg/hr, Intravenous, Continuous, Kendal Hymen, RPH, Stopped at 05/18/23 0810   Chlorhexidine Gluconate Cloth 2 % PADS 6 each, 6 each, Topical, Daily, Leatha Gilding, MD, 6 each at 05/18/23 0955   famotidine (PEPCID) tablet 20 mg, 20 mg, Oral, Daily, Karie Fetch P, DO, 20 mg at 05/18/23 0944   feeding supplement (ENSURE ENLIVE / ENSURE PLUS) liquid 237 mL, 237 mL, Oral, BID BM, Clark, Montel Vanderhoof P, DO, 237 mL at 05/18/23 1418   furosemide (LASIX) 120 mg in dextrose 5 % 50 mL IVPB, 120 mg, Intravenous, BID, Bufford Buttner, MD, Stopped at 05/18/23 1236   heparin injection 1,000 Units, 1,000 Units, Intracatheter, PRN, Dagoberto Ligas, MD   HYDROmorphone (DILAUDID) injection 0.5-1 mg, 0.5-1 mg, Intravenous, Q3H PRN, Lanier Clam, NP, 1 mg at 05/13/23 2135   insulin aspart (novoLOG) injection 0-20 Units, 0-20 Units, Subcutaneous, TID WC, Autry, Lauren E, PA-C, 4 Units at 05/18/23 1120   insulin aspart (novoLOG) injection 0-5 Units, 0-5 Units, Subcutaneous, QHS, Paliwal, Aditya, MD, 5 Units at 05/17/23 2205   insulin aspart (novoLOG) injection 4 Units, 4 Units, Subcutaneous, TID WC, Steffanie Dunn, DO, 4 Units at 05/18/23 1120   insulin glargine-yfgn (SEMGLEE) injection 25 Units, 25 Units, Subcutaneous, QHS, Autry, Lauren E, PA-C   lidocaine (LIDODERM) 5 % 1 patch, 1 patch, Transdermal, Q24H, Pia Mau D, PA-C, 1 patch at 05/17/23 4782   methocarbamol (ROBAXIN) tablet 500 mg, 500 mg, Oral, BID, Cheri Fowler, MD, 500 mg at 05/18/23 0944   midodrine (PROAMATINE) tablet 5 mg, 5 mg, Oral, TID WC, Bufford Buttner, MD, 5 mg at 05/18/23 1119    multivitamin (RENA-VIT) tablet 1 tablet, 1 tablet, Oral, QHS, Chand, Garnet Sierras, MD, 1 tablet at 05/17/23 2134   naloxone [facility]) injection 0.4 mg, 0.4 mg, Intravenous, PRN, Pia Mau D, PA-C   nicotine (NICODERM CQ - dosed in mg/24 hours) patch 14 mg, 14 mg, Transdermal, Daily, Cloyd Stagers M, PA-C, 14 mg at 05/18/23 0945   nitroGLYCERIN (NITROSTAT) SL tablet 0.4 mg, 0.4 mg, Sublingual, Q5 min PRN, Eduard Clos, MD   ondansetron [facility]) injection 4 mg, 4 mg, Intravenous, Q8H PRN, Robbie Lis M, PA-C, 4 mg at 05/10/23 2232   Oral care mouth rinse, 15  mL, Mouth Rinse, PRN, Karie Fetch P, DO   oxyCODONE (Oxy IR/ROXICODONE) immediate release tablet 10 mg, 10 mg, Oral, Q6H, Chand, Sudham, MD, 10 mg at 05/18/23 1416  Patients Current Diet:  Diet Order             Diet NPO time specified  Diet effective now           Diet Carb Modified Fluid consistency: Thin; Room service appropriate? Yes  Diet effective now                   Precautions / Restrictions Precautions Precautions: Fall Precaution/Restrictions Comments: watch SpO2/HR Restrictions Weight Bearing Restrictions Per Provider Order: No   Has the patient had 2 or more falls or a fall with injury in the past year? No  Prior Activity Level Community (5-7x/wk): Pt. active in the community PTA  Prior Functional Level Self Care: Did the patient need help bathing, dressing, using the toilet or eating? Independent  Indoor Mobility: Did the patient need assistance with walking from room to room (with or without device)? Independent  Stairs: Did the patient need assistance with internal or external stairs (with or without device)? Independent  Functional Cognition: Did the patient need help planning regular tasks such as shopping or remembering to take medications? Independent  Patient Information Are you of Hispanic, Latino/a,or Spanish origin?: A. No, not of Hispanic, Latino/a, or Spanish origin What is your  race?: A. White Do you need or want an interpreter to communicate with a doctor or health care staff?: 0. No  Patient's Response To:  Health Literacy and Transportation Is the patient able to respond to health literacy and transportation needs?: Yes Health Literacy - How often do you need to have someone help you when you read instructions, pamphlets, or other written material from your doctor or pharmacy?: Never In the past 12 months, has lack of transportation kept you from medical appointments or from getting medications?: No In the past 12 months, has lack of transportation kept you from meetings, work, or from getting things needed for daily living?: No  Home Assistive Devices / Equipment Home Equipment: Agricultural consultant (2 wheels), Shower seat - built in, [place] - single point  Prior Device Use: Indicate devices/aids used by the patient prior to current illness, exacerbation or injury? Mechanical lift, Walker, Orthotics/Prosthetics, and None of the above  Current Functional Level Cognition  Overall Cognitive Status: No family/caregiver present to determine baseline cognitive functioning Current Attention Level: Focused Orientation Level: Oriented X4 Following Commands: Follows one step commands inconsistently, Follows one step commands with increased time Safety/Judgement: Decreased awareness of safety, Decreased awareness of deficits General Comments: pt with noted confusion, delayed processing, impaired comprehension, and apprehension about "pushing it too much." Pt requiring max encouragement from PT and RN regarding attempting second bout of ambulation, however once started pt doubled the expectation    Extremity Assessment (includes Sensation/Coordination)  Upper Extremity Assessment: Right hand dominant, Overall WFL for tasks assessed RUE Deficits / Details: strength and ROM WFL; decreased proprioception; decreased coordination RUE Sensation: decreased proprioception RUE  Coordination: decreased fine motor LUE Deficits / Details: strength and ROM WFL; decreased proprioception; decreased coordination LUE Sensation: decreased proprioception LUE Coordination: decreased fine motor  Lower Extremity Assessment: Defer to PT evaluation    ADLs  Overall ADL's : Needs assistance/impaired Eating/Feeding: Set up, Bed level Eating/Feeding Details (indicate cue type and reason): for use of cup with straw for sips of water Grooming: Set up, Sitting, Wash/dry  face Grooming Details (indicate cue type and reason): seated at sink. pt reported unable to stand long enough to complete task Upper Body Bathing: Moderate assistance, Bed level, Cueing for sequencing, Cueing for safety, Cueing for compensatory techniques (cues for initiation) Lower Body Bathing: Maximal assistance, Sit to/from stand Lower Body Bathing Details (indicate cue type and reason): assist for posterior hygiene standing at bedside (NT present) Upper Body Dressing : Moderate assistance, Sitting Upper Body Dressing Details (indicate cue type and reason): front opening gown Lower Body Dressing: Maximal assistance, Sitting/lateral leans Lower Body Dressing Details (indicate cue type and reason): shoes Toilet Transfer Details (indicate cue type and reason): not attempted this day due to RN request to see pt from bed level this session Toileting- Clothing Manipulation and Hygiene: Total assistance, Bed level Functional mobility during ADLs: Minimal assistance, +2 for safety/equipment, Rollator (4 wheels) General ADL Comments: NTs present, hopeful to attempt bathing standing/seated at sink + short walk though pt quickly fatigued with trial of RW rather than Carley Hammed walker    Mobility  Overal bed mobility: Needs Assistance Bed Mobility: Supine to Sit Rolling: Mod assist, Max assist, Used rails Supine to sit: Min assist Sit to supine: Contact guard assist General bed mobility comments: received in chair     Transfers  Overall transfer level: Needs assistance Equipment used: Rollator (4 wheels) Transfers: Sit to/from Stand Sit to Stand: Min assist, +2 safety/equipment General transfer comment: Cues for hand placement, increased time, use of momentum    Ambulation / Gait / Stairs / Wheelchair Mobility  Ambulation/Gait Ambulation/Gait assistance: Min assist, +2 physical assistance, +2 safety/equipment Gait Distance (Feet): 60 Feet (4x) Assistive device: Rollator (4 wheels) Gait Pattern/deviations: Step-through pattern, Decreased stride length, Wide base of support General Gait Details: L foot in slight plantarflexion. Pt has moderate use of UE on rollator. Intermittent seated rest breaks due to shortness of breathe with cues for pursed lipped breathine. Gait velocity: decreased Gait velocity interpretation: <1.31 ft/sec, indicative of household ambulator    Posture / Balance Dynamic Sitting Balance Sitting balance - Comments: no LOB bending over to adjust shoe Balance Overall balance assessment: Needs assistance Sitting-balance support: Feet supported, No upper extremity supported Sitting balance-Leahy Scale: Good Sitting balance - Comments: no LOB bending over to adjust shoe Standing balance support: Bilateral upper extremity supported, Reliant on assistive device for balance Standing balance-Leahy Scale: Fair Standing balance comment: heavily dependent on UEs on home rollator, no overt LOB    Special needs/care consideration Skin ***   Previous Home Environment (from acute therapy documentation) Living Arrangements: Spouse/significant other, Children (adult children ages 33, 2, and 43) Available Help at Discharge: Family, Available 24 hours/day Type of Home: House Home Layout: One level Home Access: Ramped entrance Bathroom Shower/Tub: Health visitor: Standard Bathroom Accessibility: Yes Home Care Services: No Additional Comments: Home living and PLOF largely  per chart review as pt able to provide only limited informaiton this session.  Discharge Living Setting Plans for Discharge Living Setting: House Type of Home at Discharge: Mobile home Discharge Home Layout: One level Discharge Home Access: Ramped entrance Discharge Bathroom Shower/Tub: Walk-in shower Discharge Bathroom Toilet: Standard Discharge Bathroom Accessibility: Yes How Accessible: Accessible via walker Does the patient have any problems obtaining your medications?: No  Social/Family/Support Systems Patient Roles: Spouse Contact Information: [name] Anticipated Caregiver: [phone] Caregiver Availability: 24/7 Discharge Plan Discussed with Primary Caregiver: Yes Is Caregiver In Agreement with Plan?: No Does Caregiver/Family have Issues with Lodging/Transportation while Pt is in Rehab?: No  Goals Patient/Family Goal for Rehab: PT/OT/SLP supervision to mod I Expected length of stay: 12-14 days Pt/Family Agrees to Admission and willing to participate: Yes Program Orientation Provided & Reviewed with Pt/Caregiver Including Roles  & Responsibilities: Yes  Decrease burden of Care through IP rehab admission: Diet advancement, Decrease number of caregivers, Bowel and bladder program, and Patient/family education  Possible need for SNF placement upon discharge: not stated  Patient Condition: I have reviewed medical records from [facility] , spoken with CM, and patient and spouse. I met with patient at the bedside for inpatient rehabilitation assessment.  Patient will benefit from ongoing PT, OT, and SLP, can actively participate in 3 hours of therapy a day 5 days of the week, and can make measurable gains during the admission.  Patient will also benefit from the coordinated team approach during an Inpatient Acute Rehabilitation admission.  The patient will receive intensive therapy as well as Rehabilitation physician, nursing, social worker, and care management  interventions.  Due to safety, skin/wound care, disease management, medication administration, and pain management the patient requires 24 hour a day rehabilitation nursing.  The patient is currently *** with mobility and basic ADLs.  Discharge setting and therapy post discharge at home with home health is anticipated.  Patient has agreed to participate in the Acute Inpatient Rehabilitation Program and will admit {Time; today/tomorrow:10263}.  Preadmission Screen Completed By:  Jeronimo Greaves, 05/18/2023 2:56 PM ______________________________________________________________________   Discussed status with Dr. Marland Kitchen on *** at *** and received approval for admission today.  Admission Coordinator:  Jeronimo Greaves, CCC-SLP, time Marland KitchenDorna Bloom ***   Assessment/Plan: Diagnosis: *** Does the need for close, 24 hr/day Medical supervision in concert with the patient's rehab needs make it unreasonable for this patient to be served in a less intensive setting? {yes_no_potentially:3041433} Co-Morbidities requiring supervision/potential complications: *** Due to {due XL:2440102}, does the patient require 24 hr/day rehab nursing? {yes_no_potentially:3041433} Does the patient require coordinated care of a physician, rehab nurse, PT, OT, and SLP to address physical and functional deficits in the context of the above medical diagnosis(es)? {yes_no_potentially:3041433} Addressing deficits in the following areas: {deficits:3041436} Can the patient actively participate in an intensive therapy program of at least 3 hrs of therapy 5 days a week? {yes_no_potentially:3041433} The potential for patient to make measurable gains while on inpatient rehab is {potential:3041437} Anticipated functional outcomes upon discharge from inpatient rehab: {functional outcomes:304600100} PT, {functional outcomes:304600100} OT, {functional outcomes:304600100} SLP Estimated rehab length of stay to reach the above functional goals is:  *** Anticipated discharge destination: {anticipated dc setting:21604} 10. Overall Rehab/Functional Prognosis: {potential:3041437}   MD Signature: ***

## 2023-05-18 NOTE — Progress Notes (Addendum)
Advanced Heart Failure Rounding Note  HF Cardiologist: Dr. Elwyn Lade   Chief Complaint: Mixed Cardiogenic and Septic Shock    Patient Profile   58 y.o. male with a PMH of obesity, HTN, HLD, DM2, and OSA admitted w/ mixed septic and cardiogenic shock>>progression to anuric renal failure requiring CRRT.  Interval Hx:    2/3: worsening septic shock>> fever up to 101, increase LA, escalation of pressors, delirium and worsening respiratory failure>>intubated. Milrinone stopped due to frequent ectopy. Abx broadened. Vanc added to meropenum. CT of C/A/P w/ multifocal PNA and s/o cystitis. 2/4: Marked improvement. Resolution of fever and delirium. Weaned off pressors, became hypertensive and started on cleviprex gtt  2/5: Extubated, weaned off Cleviprex, Cortrak placed (post pyloric) 2/7: RHC, DCCV AFL to NSR.   Subjective:    Trialysis catheter pulled out last night. IR consulted for [facility].  Remains oliguric, 295 cc UOP last 24 hrs.  CO-OX likely inaccurate, 88% today. CVP 5-6.  Feels okay. Ambulated with OT yesterday.  Objective:    Weight Range: 131.6 kg Body mass index is 38.28 kg/m.   Vital Signs:   Temp:  [97.4 F (36.3 C)-98.7 F (37.1 C)] 98.7 F (37.1 C) (02/12 0600) Pulse Rate:  [89-273] 99 (02/12 0755) Resp:  [15-34] 23 (02/12 0755) BP: (93-129)/(28-78) 125/74 (02/12 0700) SpO2:  [87 %-98 %] 95 % (02/12 0755) FiO2 (%):  [40 %] 40 % (02/11 2156) Weight:  [131.6 kg] 131.6 kg (02/12 0500) Last BM Date : 05/13/23  Weight change: Filed Weights   05/16/23 1859 05/17/23 0500 05/18/23 0500  Weight: 125.1 kg 130 kg 131.6 kg   Intake/Output:   Intake/Output Summary (Last 24 hours) at 05/18/2023 0810 Last data filed at 05/18/2023 0800 Gross per 24 hour  Intake 859.65 ml  Output 320 ml  Net 539.65 ml    Physical Exam    General:  Well appearing. Sitting up in bed. Neck: JVP 7-8 Cor: PMI nondisplaced. Regular rate & rhythm. No rubs, gallops or murmurs. Lungs:  + rhonchi. No distress on 7L HFNC. Abdomen: obese, soft, nontender, nondistended. Extremities: no cyanosis, clubbing, rash, 1+ edema Neuro: alert & orientedx3. Affect pleasant   Telemetry: SR/ST 90s-100s  Labs    CBC Recent Labs    05/17/23 0428 05/18/23 0540  WBC 10.8* 14.7*  HGB 12.1* 11.6*  HCT 36.3* 35.5*  MCV 78.9* 79.4*  PLT 174 205   Basic Metabolic Panel Recent Labs    09/81/19 0428 05/18/23 0540  NA 134* 130*  K 4.2 4.4  CL 98 95*  CO2 23 22  GLUCOSE 202* 313*  BUN 54* 84*  CREATININE 4.66* 6.31*  CALCIUM 8.1* 8.1*  MG 2.2 2.2  PHOS 3.7 4.2   Liver Function Tests Recent Labs    05/16/23 0335 05/17/23 0428 05/18/23 0540  AST 35  --   --   ALT 40  --   --   ALKPHOS 85  --   --   BILITOT 2.0*  --   --   PROT 5.9*  --   --   ALBUMIN 2.3* 2.4* 2.1*   No results for input(s): "LIPASE", "AMYLASE" in the last 72 hours.  Cardiac Enzymes No results for input(s): "CKTOTAL", "CKMB", "CKMBINDEX", "TROPONINI" in the last 72 hours.   BNP: BNP (last 3 results) Recent Labs    05/04/23 1645  BNP 1,275.2*   Medications:    Scheduled Medications:  amiodarone  400 mg Oral BID   aspirin EC  81 mg  Oral Daily   bacitracin   Topical BID   Chlorhexidine Gluconate Cloth  6 each Topical Daily   famotidine  20 mg Oral Daily   feeding supplement  237 mL Oral BID BM   insulin aspart  0-20 Units Subcutaneous TID WC   insulin aspart  0-5 Units Subcutaneous QHS   insulin aspart  4 Units Subcutaneous TID WC   insulin glargine-yfgn  25 Units Subcutaneous QHS   lidocaine  1 patch Transdermal Q24H   methocarbamol  500 mg Oral BID   midodrine  5 mg Oral TID WC   multivitamin  1 tablet Oral QHS   nicotine  14 mg Transdermal Daily   oxyCODONE  10 mg Oral Q6H    Infusions:  anticoagulant sodium citrate     bivalirudin (ANGIOMAX) 250 mg in sodium chloride 0.9 % 500 mL (0.5 mg/mL) infusion 0.045 mg/kg/hr (05/18/23 0600)    PRN Medications: acetaminophen  (TYLENOL) oral liquid 160 mg/5 mL, albuterol, alteplase, anticoagulant sodium citrate, heparin, HYDROmorphone (DILAUDID) injection, naLOXone (NARCAN)  injection, nitroGLYCERIN, ondansetron (ZOFRAN) IV, mouth rinse  Assessment/Plan   Mixed cardiogenic/septic shock: Presented febrile, cold on exam, with worsening blood pressure. TEE w/ moderately reduced LVEF. Initially on Milrinone. 2/3 developed worsening septic shock w/ fever, increase in lactic acid, delirium, and worsening respiratory failure>>intubated. Milrinone stopped due to frequent ectopy. CT of C/A/P w/ multifocal PNA and s/o cystitis. UA dirty, Ucx multiple species, Respiratory Cx rare MSSA, Bcx NG. - RHC 2/7: CI 2 to 2.3 L/min/m in AF. - Can stop checking CO-OX. - Completed 7 day course Meropenem. - Resolved  Acute systolic CHF - Suspect stress CM in setting of sepsis - Echo 1/30: Unable to estimate EF d/t image quality, RWMA - Limited echo 02/06: EF 40-45%, RWMA, RV reduced - RHC 2/7 CI of 2-2.3 in AF. I suspect his LVEF will improve now that he is out of septic shock - CVP 5-6. Needs HD but trialysis catheter removed overnight. IR consulted for [facility]. - GDMT limited by hypotension and renal function  Acute hypoxic and hypercarbic respiratory failure Hx OSA - Secondary to PNA and acute heart failure. Intubated 2/3 - extubated 2/5 - continue nightly BiPAP/[place] during day. Currently on 7 L HFNC  Atrial flutter with RVR - NSR on tele  - continue amiodarone 400mg  BID  Thrombocytopenia - Resolved.  - On bivalirudin. Heparin stopped. HIT panel negative.  - can transition to DOAC after TDC  Hypertension  - BP has been soft. Now requiring midodrine for HD  AKI: suspect ATN 2/2 mixed shock. Remains oliguric. -Plan for HD once he gets [facility]. -Nephrology following   Shock liver: - 2/2 mixed shock/hepatic congestion from CHF  - Improving  Diabetes: poorly controlled, A1c 10.2   - insulin management per CCM   Acute metabolic  encephalopathy - resolved  Deconditioning - appears extremely weak - Aggressive PT/OT   Length of Stay: 13  FINCH, LINDSAY N, PA-C  05/18/2023, 8:10 AM  Advanced Heart Failure Team Pager [phone] (M-F; 7a - 5p)  Please contact [facility] for night-coverage after hours (5p -7a ) and weekends on [email or web address]  Patient seen with PA, I formulated plan and agree with the above note.   Supposed to get HD today but pulled out catheter last night.  CVP 5-6.  Feels weak. SBP 110s-120s on midodrine.   He remains in NSR.   General: NAD Neck: No JVD, no thyromegaly or thyroid nodule.  Lungs: Clear to auscultation bilaterally with normal  respiratory effort. CV: Nondisplaced PMI.  Heart regular S1/S2, no S3/S4, no murmur.  No peripheral edema.    Abdomen: Soft, nontender, no hepatosplenomegaly, no distention.  Skin: Intact without lesions or rashes.  Neurologic: Alert and oriented x 3.  Psych: Normal affect. Extremities: No clubbing or cyanosis.  HEENT: Normal.   Continue po amiodarone (decrease to 200 mg bid).  and bivalirudin for atrial flutter, transition to apixaban when all procedures completed. HIT negative, thrombocytopenia resolved.    He remains oliguric.  CVP 4-5 today.  He had hypotension with HD Monday.  Now on midodrine 5 mg tid and encouraging po hydration.  Needs HD today but pulled out line.  IR consulted for line.    EF 40-45% with RV dysfunction on last echo, no BP room for GDMT.   Marca Ancona 05/18/2023 10:55 AM

## 2023-05-18 NOTE — Plan of Care (Signed)
  Problem: Clinical Measurements: Goal: Respiratory complications will improve Outcome: Progressing   Problem: Activity: Goal: Risk for activity intolerance will decrease Outcome: Progressing   Problem: Activity: Goal: Ability to return to baseline activity level will improve Outcome: Progressing

## 2023-05-18 NOTE — Progress Notes (Signed)
Got a call from CCM.  We have heard from IR that they do not want to place a TDC due to an elevated WBC.  CCM would like to give him a little more of a line holiday if possible. I  have trended his labs and feel that he can wait until tomorrow to get HD but likely not longer than that as his BUN will be well over 100.  I have informed HD that I modified his HD orders to be done on 2/13 instead of 2/12.  Will see if he meets IR criteria but if not CCM will place vascath   Cecille Aver

## 2023-05-18 NOTE — Progress Notes (Addendum)
Inpatient Rehab Admissions Coordinator:    CIR is following. Not ready for CIR at this time. I will follow for potential admit once medically ready. I did speak with Pt.'s wife, [name], and she states that Pt. She can care for Pt. At d/c, but she does leave the home for about 2 hrs a day to check on her parents and provide care to them.   Megan Salon, MS, CCC-SLP Rehab Admissions Coordinator  [phone] (celll) [phone] (office)

## 2023-05-18 NOTE — Progress Notes (Signed)
NAME:  Dylan Owen, MRN:  [id number], DOB:  [birth year], LOS: 13 ADMISSION DATE:  05/04/2023, CONSULTATION DATE:  05/07/2023 REFERRING MD:  Elvera Lennox - TRH, CHIEF COMPLAINT:  Resp failure shock    History of Present Illness:  58 year old male with PMHx significant for obesity, hypertension, hyperlipidemia, type 2 diabetes, sleep apnea, chronic back pain, depression, acid reflux.  Also depression significant home medications include Lasix, hydrochlorothiazide.  Lisinopril, metoprolol, nitroglycerin but also Robaxin and MS Contin 30 mg twice daily along with Repatha.  And Effexor.  Along with insulin for diabetes.  She he walks around with a cane at baseline.  He also admits to medication noncompliance.  And 10 pound weight gain he is not on oxygen at home.  His hemoglobin A1c was 10.2 at admission consistent with very poor compliance.   Admitted on 05/04/2023 with complaints of cough, sore throat, dizziness and bodyaches for 1 week.  Also included chest tightness headaches chills diaphoresis.  At the time of arrival to the ED also had dyspnea on exertion.  At admission BNP was 1275.  Respiratory virus panel was negative.  He was started on Lasix 60 twice daily and his home lisinopril.  He was initially hypertensive and with this management blood pressure improved.  Echocardiogram showed low ejection fraction approximately 25% although poor acoustic windows.  Chest x-ray suggestive of venous congestion.  Cardiology consultation 05/06/2023 noted net -1.6 L since admission and patient was given history of increased frequency of urination.  Diagnose of viral myocarditis was made and left a right heart catheterization was considered.  Troponin barely elevated and consistent with viral myocarditis.   Postadmission with diuresis as of 05/06/2023 creatinine rose up to 1.57 mg percent [baseline 1 mg percent].  At this point lisinopril was held.  811/31/25 developed hypoglycemia requiring infusion of dextrose and  transferred to the intensive care unit.  Post transfer to the intensive care unit he developed acute fever and also BiPAP dependency.  Along with worsening renal failure further to creatinine of 2.83 mg percent along with persistent lactic acidosis around 3.  And mild intermittent delirium.  Overnight diaphoresis reported but despite all this nursing consistently reports that his periphery is extremely cold.   Cardiology has been reconsulted.  Critical care medicine consulted.  Pertinent Medical History:  B12 deficiency, Blood transfusion without reported diagnosis, Chronic low back pain, Depression, Diabetes mellitus with complication (HCC), GERD (gastroesophageal reflux disease), Heart murmur, History of adenomatous polyp of colon (05/22/2021), Hypercholesterolemia, Hypertension, OSA (obstructive sleep apnea), Peripheral neuropathy, Persistent asthma, Seizures (HCC), Stable angina (HCC), Statin intolerance, and Subclinical hypothyroidism (06/2017).    reports that he has never smoked. His smokeless tobacco use includes chew.  Significant Hospital Events: Including procedures, antibiotic start and stop dates in addition to other pertinent events   1/29 Admit to [facility], diuresis 1/30 ECHO w LVEF 25% 1/31 Cards consult, c/f viral myocarditis  2/1 PCCM consult txf to [facility], Adv HF consult  2/2 For HD cath placement and CRRT. Incr D10 for persistent hypogly   2/3 Milrinone stopped. Weaning NE; intubated due to being on bipap for a few days and needing to go to CT  2/5 Extubated. Cleviprex for hypertension   2/6 CT Head for anisocoria, no acute findings. Off clevi. Adding PRN metop. CRRT UF decr to 150/hr.  2/7 On BiPAP. RHC and DCCV  2/8 Off crrt  2/9 Albumin + lasix challenge. HIT neg   2/10: got iHD 2/12: HD cath pulled by pt overnight. Ordered  IR for [facility]. Holding bivalirudin   Interim History / Subjective:  Patient accidentally pulled out HD cath overnight. Was to have iHD today. Holding  bivaliruding and placed order to IR for [facility] placement. Will likely need to stay in unit for iHD given softer pressures. Still on 6-7L [place]  Objective:  Blood pressure 116/70, pulse (!) 103, temperature (!) 97.4 F (36.3 C), temperature source Oral, resp. rate 20, height 6\' 1"  (1.854 m), weight 131.6 kg, SpO2 96%. CVP:  [3 mmHg-18 mmHg] 18 mmHg  Vent Mode: BIPAP;PSV FiO2 (%):  [40 %] 40 % PEEP:  [5 cmH20] 5 cmH20   Intake/Output Summary (Last 24 hours) at 05/18/2023 1029 Last data filed at 05/18/2023 1000 Gross per 24 hour  Intake 1448.53 ml  Output 270 ml  Net 1178.53 ml   Filed Weights   05/16/23 1859 05/17/23 0500 05/18/23 0500  Weight: 125.1 kg 130 kg 131.6 kg   Physical Examination: General: acute on chronically ill appearing male laying in bed, no acute distress, eating breakfast  HEENT: anicteric sclera, PERRLA, mmm Neuro: awake, alert, oriented x4. Non focal neurological exam CV: sinus rhythm to sinus tachy, no m/r/g PULM: even and unlabored, on 7LNC (down from 10), diminished in bases without wheezing, rhonchi  GI: rounded, soft, ntnd, +BS Extremities: 1-2+ BLE pitting edema Skin: warm/dry. Abrasion to bridge of nose. Chronic changes to skin in BLE  Resolved Hospital Problem List:   Hypomagnesemia shock Assessment & Plan:   Acute metabolic encephalopathy, improving - Correct metabolic derangements, iHD tolerated 2/10 for clearance only - Delirium precautions - Maintain normal sleep/wake cycle as able - Busy activities such as busy board/apron, patient is very task-oriented - Neuroprotective measures: HOB > 30 degrees, normoglycemia, normothermia, electrolytes WNL  Sepsis 2/2 UTI, multifocal MSSA PNA , shock resolved  Initial concern for cardiogenic shock w HFrEF, possible viral myocarditis but coox et al have been assuring against. Course has been most c/w septic shock, compounded by septic cardiomyopathy + pHTN, sequelae of multisystem organ dysfxn. Cx have not  identified an org, however clinically he continued to decline until we broadened to [facility], and turned around pretty rapidly thereafter.  - completed 7 day course of mero with improvement  - culture date with MSSA, broadly sensitive  - trend fever, wbc curve (WBC up to 14 day)  Acute hypoxic and hypercarbic resp failure MSSA PNA Hx OSA non on CPAP  - Supplemental O2 support for SpO2 > 90% - resp status would benefit from volume removal with dialysis  - BiPAP PRN + at bedtime - Pulmonary hygiene, encourage OOB/mobility - Outpatient sleep evaluation recommended  New Afib/flutter s/p DCCV 2/7 -- now sinus  Acute (possibly on chronic) HFrEF-- septic cardiomyopathy  pHTN (groud II and III) Initial c/f myocarditis but less favored as course/workup went on. Repeat Echo EF 40-45%. RHC with normal R and L filling pressures, elevated PA mean and PVR. - Amiodarone transitioned to PO - Bivalrudin for AC, HIT Ab negative - holding now for IR TDC placement  - Cardiac monitoring - Optimize electrolytes for K > 4, Mg > 2 - Encourage NIV compliance - Volume optimization with iHD as tolerated  Thrombocytopenia, improving  HIT ruled out  HIT ab 0.085, WNL. - bival - on hold for [facility] placement then can continue  - Consider DOAC once renal function stabilizes  AKI likely ATN AGMA - Nephro following, appreciate assistance - Tolerated iHD 2/10 for clearance, unable to pull fluid due to soft BP; Nephro planning  to make adjustments to see if next session is better tolerated - Trend BMP - Replete electrolytes as indicated - Monitor I&Os - Avoid nephrotoxic agents as able - Ensure adequate renal perfusion  Elevated LFTs, improving mild hyperbilirubinemia mild hyperammonemia  Acute hep neg. Shock liver most favored. Possible congestive hepatopathy component but if septic CM less favored   - LFTs grossly improved - Intermittent monitoring  DM2 - cbg QID before meals and at bedtime  - semglee 25U  nightly  - novolog 4U @ meals  - SSI changed to resistant coverage for meals  - con't HS SSI coverage  Code status discussion / GOC  Spoke w wife 2/3 who verified Full code/ full scope of practice - Remains full code  Best Practice (right click and "Reselect all SmartList Selections" daily)   Diet/type: Regular consistency (see orders) DVT prophylaxis bival  Pressure ulcer(s): pressure ulcer assessment deferred  GI prophylaxis: H2B Lines:  Central line and yes and it is still needed - PICC  Foley:  Yes, and it is still needed  Code Status:  full code Last date of multidisciplinary goals of care discussion [Wife updated via phone 2/11]  Critical care time: 32   Cristopher Peru, PA-C [facility] 05/18/23 10:29 AM  Please see [email or web address] for pager details.  From 7A-7P if no response, please call [phone] After hours, please call ELink [phone]

## 2023-05-18 NOTE — Progress Notes (Signed)
Patient ID: Dylan Owen, male   DOB: Aug 05, 1965, 58 y.o.   MRN: [id number] [facility] Progress Note   Assessment/ Plan:   1. Acute kidney Injury: With essentially normal renal function at baseline (creatinine 1.1) and suffered ATN in the setting of shock.  He was oliguric overnight with some improvement of urine output but without functional renal recovery.  Will order for dialysis to maintain euvolemic state and manage azotemia.  - ordered [facility] for HD access - Lasix 120 IV BID while waiting - midodrine on board now - HD when access obtained 2.  Mixed cardiogenic/septic shock: Clinically suspected to be associated with viral myocarditis/acute CHF exacerbation.  CRRT discontinued over the weekend and he has been about 3-4 L net positive; urine output appears to be picking up and is charted at 275 cc overnight.  Will undertake dialysis today with goal of ultrafiltration 2.5 L as tolerated by his blood pressure. 3.  Shock liver: Transaminases appear to be trending down with hemodynamic support.  Hemodynamically improving. 4.  Acute hypoxic/hypercarbic respiratory failure: Tolerating supplemental oxygen via nasal cannula after earlier requiring intubation/ventilator support and intermittent NIPPV.  Subjective:    Pulled out HD cath overnight.     Objective:   BP 116/70   Pulse (!) 103   Temp (!) 97.4 F (36.3 C) (Oral)   Resp 20   Ht 6\' 1"  (1.854 m)   Wt 131.6 kg   SpO2 96%   BMI 38.28 kg/m   Intake/Output Summary (Last 24 hours) at 05/18/2023 1054 Last data filed at 05/18/2023 1000 Gross per 24 hour  Intake 1448.53 ml  Output 270 ml  Net 1178.53 ml   Weight change: 6.5 kg  Physical Exam: Gen: Awake and alert, watching television CVS: Pulse regular rhythm, normal rate, S1 and S2 normal Resp: Anteriorly clear to auscultation, no rales/rhonchi Abd: Soft, obese, nontender, bowel sounds normal Ext: 1+ bilateral lower extremity edema with skin wrinkling, trace-1+  upper extremity edema with some dependent edema  Imaging: No results found.   Labs: BMET Recent Labs  Lab 05/13/23 1643 05/13/23 1952 05/14/23 0410 05/14/23 1627 05/15/23 0421 05/16/23 0335 05/17/23 0428 05/18/23 0540  NA 133*  --  135 128* 132* 131* 134* 130*  K 3.8  --  4.3 4.7 4.3 4.6 4.2 4.4  CL 99  --  99 92* 97* 91* 98 95*  CO2 16*  --  20* 18* 19* 20* 23 22  GLUCOSE 359*  --  188* 405* 179* 271* 202* 313*  BUN 30*  --  46* 62* 72* 95* 54* 84*  CREATININE 2.53*  --  4.05* 4.68* 5.47* 6.86* 4.66* 6.31*  CALCIUM 7.2*  --  8.2* 7.5* 8.0* 8.8* 8.1* 8.1*  PHOS 4.8* 5.2* 5.3* 3.4 4.1 3.1 3.7 4.2   CBC Recent Labs  Lab 05/15/23 0710 05/16/23 0335 05/17/23 0428 05/18/23 0540  WBC 12.9* 12.4* 10.8* 14.7*  HGB 14.2 13.4 12.1* 11.6*  HCT 43.4 40.2 36.3* 35.5*  MCV 79.8* 78.7* 78.9* 79.4*  PLT 165 172 174 205    Medications:     amiodarone  400 mg Oral BID   aspirin EC  81 mg Oral Daily   bacitracin   Topical BID   Chlorhexidine Gluconate Cloth  6 each Topical Daily   famotidine  20 mg Oral Daily   feeding supplement  237 mL Oral BID BM   insulin aspart  0-20 Units Subcutaneous TID WC   insulin aspart  0-5 Units Subcutaneous  QHS   insulin aspart  4 Units Subcutaneous TID WC   insulin glargine-yfgn  25 Units Subcutaneous QHS   lidocaine  1 patch Transdermal Q24H   methocarbamol  500 mg Oral BID   midodrine  5 mg Oral TID WC   multivitamin  1 tablet Oral QHS   nicotine  14 mg Transdermal Daily   oxyCODONE  10 mg Oral Q6H    Bufford Buttner MD 05/18/2023, 10:54 AM

## 2023-05-19 ENCOUNTER — Inpatient Hospital Stay (HOSPITAL_COMMUNITY): Payer: PPO

## 2023-05-19 DIAGNOSIS — D649 Anemia, unspecified: Secondary | ICD-10-CM

## 2023-05-19 DIAGNOSIS — I5043 Acute on chronic combined systolic (congestive) and diastolic (congestive) heart failure: Secondary | ICD-10-CM | POA: Diagnosis not present

## 2023-05-19 DIAGNOSIS — E162 Hypoglycemia, unspecified: Secondary | ICD-10-CM

## 2023-05-19 HISTORY — PX: IR FLUORO GUIDE CV LINE RIGHT: IMG2283

## 2023-05-19 HISTORY — PX: IR US GUIDE VASC ACCESS RIGHT: IMG2390

## 2023-05-19 LAB — CBC
HCT: 34.5 % — ABNORMAL LOW (ref 39.0–52.0)
Hemoglobin: 11.3 g/dL — ABNORMAL LOW (ref 13.0–17.0)
MCH: 26.2 pg (ref 26.0–34.0)
MCHC: 32.8 g/dL (ref 30.0–36.0)
MCV: 79.9 fL — ABNORMAL LOW (ref 80.0–100.0)
Platelets: 263 10*3/uL (ref 150–400)
RBC: 4.32 MIL/uL (ref 4.22–5.81)
RDW: 17.8 % — ABNORMAL HIGH (ref 11.5–15.5)
WBC: 14.8 10*3/uL — ABNORMAL HIGH (ref 4.0–10.5)
nRBC: 0 % (ref 0.0–0.2)

## 2023-05-19 LAB — RENAL FUNCTION PANEL
Albumin: 2.1 g/dL — ABNORMAL LOW (ref 3.5–5.0)
Anion gap: 15 (ref 5–15)
BUN: 105 mg/dL — ABNORMAL HIGH (ref 6–20)
CO2: 22 mmol/L (ref 22–32)
Calcium: 8.5 mg/dL — ABNORMAL LOW (ref 8.9–10.3)
Chloride: 95 mmol/L — ABNORMAL LOW (ref 98–111)
Creatinine, Ser: 6.74 mg/dL — ABNORMAL HIGH (ref 0.61–1.24)
GFR, Estimated: 9 mL/min — ABNORMAL LOW (ref >60)
Glucose, Bld: 241 mg/dL — ABNORMAL HIGH (ref 70–99)
Phosphorus: 4.3 mg/dL (ref 2.5–4.6)
Potassium: 4.8 mmol/L (ref 3.5–5.1)
Sodium: 132 mmol/L — ABNORMAL LOW (ref 135–145)

## 2023-05-19 LAB — GLUCOSE, CAPILLARY
Glucose-Capillary: 222 mg/dL — ABNORMAL HIGH (ref 70–99)
Glucose-Capillary: 251 mg/dL — ABNORMAL HIGH (ref 70–99)
Glucose-Capillary: 267 mg/dL — ABNORMAL HIGH (ref 70–99)
Glucose-Capillary: 273 mg/dL — ABNORMAL HIGH (ref 70–99)
Glucose-Capillary: 68 mg/dL — ABNORMAL LOW (ref 70–99)
Glucose-Capillary: 69 mg/dL — ABNORMAL LOW (ref 70–99)
Glucose-Capillary: 69 mg/dL — ABNORMAL LOW (ref 70–99)
Glucose-Capillary: 79 mg/dL (ref 70–99)
Glucose-Capillary: 91 mg/dL (ref 70–99)

## 2023-05-19 LAB — PROCALCITONIN: Procalcitonin: 0.93 ng/mL

## 2023-05-19 LAB — PROTIME-INR
INR: 1.5 — ABNORMAL HIGH (ref 0.8–1.2)
Prothrombin Time: 17.9 s — ABNORMAL HIGH (ref 11.4–15.2)

## 2023-05-19 LAB — APTT: aPTT: 70 s — ABNORMAL HIGH (ref 24–36)

## 2023-05-19 LAB — MAGNESIUM: Magnesium: 2.3 mg/dL (ref 1.7–2.4)

## 2023-05-19 MED ORDER — HEPARIN SODIUM (PORCINE) 1000 UNIT/ML IJ SOLN
INTRAMUSCULAR | Status: AC
  Filled 2023-05-19: qty 10

## 2023-05-19 MED ORDER — DEXTROSE 50 % IV SOLN
12.5000 g | Freq: Once | INTRAVENOUS | Status: AC
  Administered 2023-05-19: 12.5 g via INTRAVENOUS

## 2023-05-19 MED ORDER — APIXABAN 5 MG PO TABS
5.0000 mg | ORAL_TABLET | Freq: Two times a day (BID) | ORAL | Status: DC
Start: 2023-05-19 — End: 2023-05-29
  Administered 2023-05-19 – 2023-05-29 (×16): 5 mg via ORAL
  Filled 2023-05-19 (×20): qty 1

## 2023-05-19 MED ORDER — LIDOCAINE HCL 1 % IJ SOLN
20.0000 mL | Freq: Once | INTRAMUSCULAR | Status: AC
  Administered 2023-05-19: 15 mL via INTRADERMAL

## 2023-05-19 MED ORDER — LIDOCAINE-EPINEPHRINE 1 %-1:100000 IJ SOLN
INTRAMUSCULAR | Status: AC
  Filled 2023-05-19: qty 1

## 2023-05-19 MED ORDER — INSULIN ASPART 100 UNIT/ML IJ SOLN: 2.0000 [IU] | Freq: Three times a day (TID) | INTRAMUSCULAR | Status: DC

## 2023-05-19 MED ORDER — CEFAZOLIN SODIUM-DEXTROSE 2-4 GM/100ML-% IV SOLN
INTRAVENOUS | Status: AC
  Filled 2023-05-19: qty 100

## 2023-05-19 MED ORDER — DEXTROSE 50 % IV SOLN
INTRAVENOUS | Status: AC
  Administered 2023-05-19: 12.5 g via INTRAVENOUS
  Filled 2023-05-19: qty 50

## 2023-05-19 MED ORDER — FENTANYL CITRATE (PF) 100 MCG/2ML IJ SOLN
INTRAMUSCULAR | Status: AC
  Filled 2023-05-19: qty 2

## 2023-05-19 MED ORDER — CEFAZOLIN SODIUM-DEXTROSE 2-4 GM/100ML-% IV SOLN
INTRAVENOUS | Status: AC | PRN
  Administered 2023-05-19: 2 g via INTRAVENOUS

## 2023-05-19 MED ORDER — LIDOCAINE HCL 1 % IJ SOLN
INTRAMUSCULAR | Status: AC
  Filled 2023-05-19: qty 20

## 2023-05-19 MED ORDER — MIDAZOLAM HCL 2 MG/2ML IJ SOLN
INTRAMUSCULAR | Status: AC
Start: 2023-05-19 — End: ?
  Filled 2023-05-19: qty 2

## 2023-05-19 NOTE — Consult Note (Addendum)
Chief Complaint: Patient was seen in consultation today for tunneled dialysis catheter placement.   Referring Provider(s): Dr. Casimiro Needle, MD, Ms. Jonna Clark, PA-C  Supervising Physician: Richarda Overlie  Patient Status: [facility] - In-pt  Patient is Full Code  History of Present Illness: Dylan Owen is a 58 y.o. male with PMHx significant for obesity, hypertension, hyperlipidemia, type 2 diabetes, sleep apnea, chronic back pain, depression, acid reflux.   Patient was admitted on 1/29 from ED with mixed septic and cardiogenic shock.  Patient developed delirium and worsening respiratory failure and was intubated on 05/09/2023.  He was extubated on 05/11/2023 with general improvement noted.  Cortrak tube was placed for nutrition.  Course complicated by acute kidney injury requiring dialysis for management. Dialysis catheter was pulled on 2/12.  Interventional Radiology was requested for tunneled dialysis catheter placement. Patient is scheduled for same in IR today.  All labs and medications are within acceptable parameters. No pertinent allergies. Patient has been NPO since midnight.    Currently without any significant complaints. Patient alert and laying in bed,calm. Denies any fevers, headache, chest pain, SOB, cough, abdominal pain, nausea, vomiting or bleeding.    Past Medical History:  Diagnosis Date   B12 deficiency    Blood transfusion without reported diagnosis    Chronic low back pain    Dr. Vear Clock is pain mgmt MD   Depression    Diabetes mellitus with complication (HCC)    DPN + microalbuminuria   GERD (gastroesophageal reflux disease)    Heart murmur    History of adenomatous polyp of colon 05/22/2021   recall 7 yrs   Hypercholesterolemia    Statin intolerant.  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   Persistent asthma    Never smoker   Seizures (HCC)    Stable angina (HCC)    nl myoview 12/07  followed by Dr Eden Emms   Statin intolerance    myalgias   Subclinical hypothyroidism 06/2017    Past Surgical History:  Procedure Laterality Date   COLONOSCOPY  05/22/2021   Adenomas--recall 7 years   CYSTOSCOPY WITH RETROGRADE URETHROGRAM N/A 08/21/2021   Procedure: RETROGRADE URETHROGRAM;  Surgeon: Crist Fat, MD;  Location: [facility];  Service: Urology;  Laterality: N/A;   CYSTOSCOPY WITH URETHRAL DILATATION N/A 08/21/2021   Procedure: CYSTOSCOPY WITH URETHRAL DILATATION OPTILUME;  Surgeon: Crist Fat, MD;  Location: [facility];  Service: Urology;  Laterality: N/A;   LUMBAR SPINE SURGERY     lumbar laminectomy with hardware stabilization, with ensuing arachnoiditis   RIGHT HEART CATH N/A 05/13/2023   Procedure: RIGHT HEART CATH;  Surgeon: Dorthula Nettles, DO;  Location: [facility];  Service: Cardiovascular;  Laterality: N/A;   URETHRAL STRICTURE DILATATION  1996 and 2004.   Laser surgery.    Allergies: Ace inhibitors, Atorvastatin, Prednisone, Septra [sulfamethoxazole-trimethoprim], and Metformin and related  Medications: Prior to Admission medications   Medication Sig Start Date End Date Taking? Authorizing Provider  Acetaminophen (TYLENOL PO) Take 500 mg by mouth daily as needed (pain).   Yes [provider]  albuterol (VENTOLIN HFA) 108 (90 Base) MCG/ACT inhaler Inhale 1-2 puffs into the lungs every 6 (six) hours as needed for wheezing or shortness of breath. 11/09/22  Yes McGowen, Maryjean Morn, MD  esomeprazole (NEXIUM) 40 MG capsule Take 1 capsule (40 mg total) by mouth daily. 11/05/22  Yes McGowen, Maryjean Morn, MD  Evolocumab (REPATHA SURECLICK) 140 MG/ML SOAJ INJECT 1 DOSE INTO THE SKIN EVERY 14 (FOURTEEN) DAYS. 09/21/22  Yes Hilty, Lisette Abu, MD  furosemide (LASIX) 20 MG tablet TAKE 1 TABLET BY MOUTH EVERY MORNING AS NEEDED FOR SWELLING 04/16/22   Yes McGowen, Maryjean Morn, MD  hydrochlorothiazide (HYDRODIURIL) 25 MG tablet Take 1 tablet (25 mg total) by mouth daily. 11/05/22 11/05/23 Yes McGowen, Maryjean Morn, MD  insulin aspart (NOVOLOG FLEXPEN) 100 UNIT/ML FlexPen 3 units SQ before breakfast, lunch, and dinner Patient taking differently: Inject 3 Units into the skin 3 (three) times daily as needed for high blood sugar. 3 units SQ before breakfast, lunch, and dinner 11/26/22  Yes McGowen, Maryjean Morn, MD  insulin degludec (TRESIBA FLEXTOUCH) 100 UNIT/ML FlexTouch Pen 80 U qAM and 72 U qPM 12/09/22  Yes McGowen, Maryjean Morn, MD  lisinopril (ZESTRIL) 40 MG tablet Take 1 tablet (40 mg total) by mouth daily. 11/05/22  Yes McGowen, Maryjean Morn, MD  methocarbamol (ROBAXIN) 500 MG tablet Take 500 mg by mouth 2 (two) times daily as needed for muscle spasms. 10/05/19  Yes [provider]  metoprolol tartrate (LOPRESSOR) 50 MG tablet TAKE 1 TABLET BY MOUTH TWICE A DAY. OFFICE VISIT NEEDED FOR FURTHER REFILLS 08/05/22  Yes McGowen, Maryjean Morn, MD  morphine (MS CONTIN) 30 MG 12 hr tablet Take 30-60 mg by mouth See admin instructions. Taking 60 mg in the AM and 30 mg in the afternoon and 60 mg at bedtime 10/05/19  Yes [provider]  Multiple Vitamin (MULTIVITAMIN) tablet Take 1 tablet by mouth daily.   Yes [provider]  nitroGLYCERIN (NITROSTAT) 0.4 MG SL tablet PLACE 1 TABLET UNDER THE TONGUE EVERY 5 MINUTES AS NEEDED. 08/07/21  Yes McGowen, Maryjean Morn, MD  venlafaxine XR (EFFEXOR-XR) 150 MG 24 hr capsule Take 150 mg by mouth 2 (two) times daily. 09/26/15  Yes [provider]  B-D ULTRAFINE III SHORT PEN 31G X 8 MM MISC USE AS DIRECTED TWICE DAILY FOR MEDICATION INJECTION 09/21/21   McGowen, Maryjean Morn, MD  blood glucose meter kit and supplies KIT Dispense based on patient and insurance preference. Use to check glucose 4 times daily. 10/22/19   McGowen, Maryjean Morn, MD  Continuous Glucose Receiver (FREESTYLE LIBRE 3 READER) DEVI Use to check glucose  continuously 11/09/22   McGowen, Maryjean Morn, MD  Continuous Glucose Sensor (FREESTYLE LIBRE 3 SENSOR) MISC Place 1 sensor on the skin every 14 days. Use to check glucose continuously 11/09/22   McGowen, Maryjean Morn, MD  FREESTYLE TEST STRIPS test strip USE TO CHECK GLUCOSE 4 TIMES DAILY. 05/05/21   McGowen, Maryjean Morn, MD     Family History  Problem Relation Age of Onset   Cancer Paternal Grandfather        prostate   Heart disease Other    Hypertension Other    Other Other        Emotional Illness   Colon cancer Neg Hx    Esophageal cancer Neg Hx    Rectal cancer Neg Hx    Stomach cancer Neg Hx     Social History   Socioeconomic History   Marital status: Married    Spouse name: Not on file   Number of children: Not on file   Years of education: Not on file   Highest education level: GED or equivalent  Occupational History  Not on file  Tobacco Use   Smoking status: Never   Smokeless tobacco: Current    Types: Chew   Tobacco comments:    dip  Vaping Use   Vaping status: Never Used  Substance and Sexual Activity   Alcohol use: No   Drug use: No   Sexual activity: Yes  Other Topics Concern   Not on file  Social History Narrative   Married, 4 children.   Occupation: disabled since 2000.     Prior to disability he worked for [employer].   Orig from [place].   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  No hx of drug abuse.   Chews tobacco since age 44 yrs.   Regular exercise:  No               Social Drivers of Corporate investment banker Strain: High Risk (12/05/2022)   Overall Financial Resource Strain (CARDIA)    Difficulty of Paying Living Expenses: Very hard  Food Insecurity: No Food Insecurity (05/05/2023)   Hunger Vital Sign    Worried About Running Out of Food in the Last Year: Never true    Ran Out of Food in the Last Year: Never true  Recent Concern: Food Insecurity - Food Insecurity Present  (05/05/2023)   Hunger Vital Sign    Worried About Running Out of Food in the Last Year: Never true    Ran Out of Food in the Last Year: Sometimes true  Transportation Needs: No Transportation Needs (05/05/2023)   PRAPARE - Administrator, Civil Service (Medical): No    Lack of Transportation (Non-Medical): No  Physical Activity: Unknown (12/05/2022)   Exercise Vital Sign    Days of Exercise per Week: 0 days    Minutes of Exercise per Session: Not on file  Stress: Stress Concern Present (12/05/2022)   [employer] of Occupational Health - Occupational Stress Questionnaire    Feeling of Stress : To some extent  Social Connections: Moderately Isolated (05/05/2023)   Social Connection and Isolation Panel [NHANES]    Frequency of Communication with Friends and Family: Three times a week    Frequency of Social Gatherings with Friends and Family: Never    Attends Religious Services: Never    Database administrator or Organizations: No    Attends Engineer, structural: Never    Marital Status: Married     Review of Systems: A 12 point ROS discussed and pertinent positives are indicated in the HPI above.  All other systems are negative.   Vital Signs: BP 110/62   Pulse 96   Temp 97.6 F (36.4 C) (Oral)   Resp 20   Ht 6\' 1"  (1.854 m)   Wt 294 lb 1.5 oz (133.4 kg)   SpO2 95%   BMI 38.80 kg/m   Advance Care Plan: The advanced care place/surrogate decision maker was discussed at the time of visit and the patient did not wish to discuss or was not able to name a surrogate decision maker or provide an advance care plan.  Physical Exam Vitals reviewed.  Constitutional:      General: He is not in acute distress.    Appearance: Normal appearance.  HENT:     Mouth/Throat:     Mouth: Mucous membranes are moist.  Cardiovascular:     Rate and Rhythm: Normal rate and regular rhythm.  Pulses: Normal pulses.     Heart sounds: No murmur heard. Pulmonary:     Breath  sounds: Examination of the right-upper field reveals rhonchi. Examination of the left-upper field reveals rhonchi. Examination of the right-middle field reveals rhonchi. Examination of the left-middle field reveals rhonchi. Examination of the right-lower field reveals rhonchi. Examination of the left-lower field reveals rhonchi. Rhonchi present.     Comments: Patient on 5 L O2 by HFNC Abdominal:     General: Abdomen is flat.     Palpations: Abdomen is soft.     Tenderness: There is no abdominal tenderness.  Skin:    General: Skin is warm and dry.  Neurological:     Mental Status: He is alert and oriented to person, place, and time.  Psychiatric:        Mood and Affect: Mood normal.        Behavior: Behavior normal.        Thought Content: Thought content normal.        Judgment: Judgment normal.     Imaging: CARDIAC CATHETERIZATION Result Date: 05/13/2023 HEMODYNAMICS: RA:   6 mmHg (mean) RV:   44/2-7 mmHg PA:   39/26 mmHg (33 mean) PCWP:  10 mmHg (mean)    Estimated Fick CO/CI   5.78 L/min, 2.3 L/min/m2 Thermodilution CO/CI   5 L/min, 2 L/min/m2     TPG    23  mmHg     PVR     4.6-4 Wood Units PAPi      2.2 IMPRESSION: Normal right and left heart filling pressures. 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 Moderately reduced cardiac output/index Aditya Sabharwal 3:19 PM   DG CHEST PORT 1 VIEW Result Date: 05/13/2023 CLINICAL DATA:  Respiratory failure. EXAM: PORTABLE CHEST 1 VIEW COMPARISON:  May 09, 2023. FINDINGS: Stable cardiomediastinal silhouette. Endotracheal and nasogastric tubes have been removed. Stable left internal jugular catheter and right-sided PICC line. Stable bibasilar atelectasis or infiltrates. Bony thorax is unremarkable. IMPRESSION: Stable bibasilar atelectasis or infiltrates. Electronically Signed   By: Lupita Raider M.D.   On: 05/13/2023 12:18   ECHOCARDIOGRAM LIMITED Result Date: 05/12/2023    ECHOCARDIOGRAM LIMITED REPORT    Patient Name:   JEDIAH HORGER Date of Exam: 05/12/2023 Medical Rec #:  [id number]         Height:       73.0 in Accession #:    [id number]        Weight:       275.1 lb Date of Birth:  1965-07-22         BSA:          2.464 m Patient Age:    58 years          BP:           116/65 mmHg Patient Gender: M                 HR:           123 bpm. Exam Location:  Inpatient Procedure: Limited Echo, Limited Color Doppler and Intracardiac Opacification            Agent Indications:    CHF I50.9  History:        Patient has prior history of Echocardiogram examinations, most                 recent 05/05/2023. CHF, Angina; Risk Factors:Hypertension, Sleep  Apnea, Diabetes, Dyslipidemia and Current Smoker.  Sonographer:    Lucendia Herrlich RCS Referring Phys: Roxy Horseman SIMMONS IMPRESSIONS  1. Left ventricular ejection fraction, by estimation, is 40 to 45%. The left ventricle has mildly decreased function. The left ventricle demonstrates regional wall motion abnormalities (see scoring diagram/findings for description). There is mild asymmetric left ventricular hypertrophy of the inferior segment.  2. The mitral valve is normal in structure. Trivial mitral valve regurgitation.  3. The aortic valve is normal in structure. Aortic valve sclerosis is present, with no evidence of aortic valve stenosis.  4. There is borderline dilatation of the ascending aorta, measuring 36 mm. FINDINGS  Left Ventricle: Left ventricular ejection fraction, by estimation, is 40 to 45%. The left ventricle has mildly decreased function. The left ventricle demonstrates regional wall motion abnormalities. Definity contrast agent was given IV to delineate the left ventricular endocardial borders. There is mild asymmetric left ventricular hypertrophy of the inferior segment.  LV Wall Scoring: The apical inferior segment and apex are akinetic. The entire anterior wall, entire lateral wall, entire septum, and inferior wall are hypokinetic. Mitral  Valve: The mitral valve is normal in structure. Trivial mitral valve regurgitation. Tricuspid Valve: The tricuspid valve is normal in structure. Tricuspid valve regurgitation is mild. Aortic Valve: The aortic valve is normal in structure. Aortic valve sclerosis is present, with no evidence of aortic valve stenosis. Aorta: There is borderline dilatation of the ascending aorta, measuring 36 mm. Additional Comments: A device lead is visualized.  LEFT VENTRICLE PLAX 2D LVIDd:         5.10 cm LVIDs:         4.10 cm LV PW:         1.20 cm LV IVS:        0.90 cm LVOT diam:     2.00 cm LVOT Area:     3.14 cm  LEFT ATRIUM         Index LA diam:    3.70 cm 1.50 cm/m   AORTA Ao Root diam: 3.50 cm Ao Asc diam:  3.60 cm  SHUNTS Systemic Diam: 2.00 cm Kardie Tobb DO Electronically signed by Thomasene Ripple DO Signature Date/Time: 05/12/2023/5:35:58 PM    Final    CT HEAD WO CONTRAST ( ) Result Date: 05/12/2023 CLINICAL DATA:  58 year old male altered mental status, neurologic deficit. EXAM: CT HEAD WITHOUT CONTRAST TECHNIQUE: Contiguous axial images were obtained from the base of the skull through the vertex without intravenous contrast. RADIATION DOSE REDUCTION: This exam was performed according to the departmental dose-optimization program which includes automated exposure control, adjustment of the mA and/or kV according to patient size and/or use of iterative reconstruction technique. COMPARISON:  05/07/2023 head CT and earlier. FINDINGS: Brain: Patchy subcortical white matter hypodensity in the anterior left frontal lobe appears stable since 05/06/2023. Mild additional patchy white matter hypodensity including in the bilateral internal capsules also stable. Small area of left caudate hypodensity is stable. No superimposed No midline shift, ventriculomegaly, mass effect, evidence of mass lesion, intracranial hemorrhage or evidence of cortically based acute infarction. Vascular: No suspicious intracranial vascular  hyperdensity. Skull: Stable and intact. Sinuses/Orbits: Visualized paranasal sinuses and mastoids are stable and well aerated. Other: Left nasoenteric tube in place. Negative orbit and scalp soft tissues. IMPRESSION: 1. Stable non contrast CT appearance of cerebral white matter and left caudate changes since last month, most compatible with small vessel disease. 2.  No acute intracranial abnormality. Electronically Signed   By: Althea Grimmer.D.  On: 05/12/2023 07:08   DG Abd Portable 1V Result Date: 05/11/2023 CLINICAL DATA:  Feeding tube placement EXAM: PORTABLE ABDOMEN - 1 VIEW COMPARISON:  05/11/2023 FINDINGS: Interval placement of non weighted enteric feeding tube, tip positioned near the descending duodenum. Nonobstructive pattern of included bowel gas. No acute osseous findings. IMPRESSION: Interval placement of non weighted enteric feeding tube, tip positioned near the descending duodenum. Electronically Signed   By: Jearld Lesch M.D.   On: 05/11/2023 12:01   DG Abd 1 View Result Date: 05/11/2023 CLINICAL DATA:  Ileus.  Sudden emesis. EXAM: ABDOMEN - 1 VIEW COMPARISON:  05/09/2023 FINDINGS: Gaseous distention of the stomach may indicate outlet obstruction or dysmotility. An enteric tube is present with tip projecting over the upper stomach. Gas-filled small and large bowel are not abnormally distended, likely ileus. No radiopaque stones. Degenerative changes and postoperative changes in the spine. IMPRESSION: 1. Gaseous distention of the stomach, possibly obstruction or dysmotility. 2. Gas-filled nondistended small and large bowel are likely ileus. 3. Enteric tube tip projects over the upper stomach. Electronically Signed   By: Burman Nieves M.D.   On: 05/11/2023 01:05   DG Abd 1 View Result Date: 05/09/2023 CLINICAL DATA:  Orogastric tube placement. EXAM: ABDOMEN - 1 VIEW COMPARISON:  CT earlier today FINDINGS: Tip of the enteric tube is below the diaphragm in the stomach, the side port is not well  visualized but likely also in the stomach. 1 gaseous gastric distension. No other bowel dilatation in the upper abdomen. IMPRESSION: Tip of the enteric tube below the diaphragm in the stomach, the side port is difficult to define but likely below the diaphragm. Electronically Signed   By: Narda Rutherford M.D.   On: 05/09/2023 18:40   CT CHEST ABDOMEN PELVIS WO CONTRAST Result Date: 05/09/2023 CLINICAL DATA:  Sepsis EXAM: CT CHEST, ABDOMEN AND PELVIS WITHOUT CONTRAST TECHNIQUE: Multidetector CT imaging of the chest, abdomen and pelvis was performed following the standard protocol without IV contrast. RADIATION DOSE REDUCTION: This exam was performed according to the departmental dose-optimization program which includes automated exposure control, adjustment of the mA and/or kV according to patient size and/or use of iterative reconstruction technique. COMPARISON:  Chest CT 12/23/2004 FINDINGS: CT CHEST FINDINGS Cardiovascular: Heart is mildly enlarged. There is no pericardial effusion. Aorta is normal in size. Right upper extremity PICC terminates at the cavoatrial junction. Left-sided central venous catheter tip ends in the SVC. Mediastinum/Nodes: There is an enlarged subcarinal lymph node measuring 18 mm short axis. There are nonenlarged, but prominent paratracheal and prevascular lymph nodes. Difficult to assess for hilar adenopathy secondary to lack contrast. Enteric tube seen throughout a nondilated esophagus. Visualized thyroid gland is within normal limits. Endotracheal tube tip is 3.5 cm above the carina. Lungs/Pleura: There is patchy airspace consolidation throughout the bilateral lower lobes. Multifocal ill-defined ground-glass, airspace and ill-defined nodular densities are seen throughout the right upper lobe. There is no pleural effusion or pneumothorax identified. Musculoskeletal: There are mild compression deformities of T10 and T11. T11 is stable from prior. Two hundred ten is new from 2006, but  favored as chronic. CT ABDOMEN PELVIS FINDINGS Hepatobiliary: No focal liver abnormality is seen. No gallstones, gallbladder wall thickening, or biliary dilatation. Pancreas: Unremarkable. No pancreatic ductal dilatation or surrounding inflammatory changes. Spleen: Normal in size without focal abnormality. Adrenals/Urinary Tract: The bladder is decompressed by Foley catheter. There is mild fat stranding surrounding the bladder. The kidneys and adrenal glands are within normal limits. Stomach/Bowel: Stomach is within normal limits.  Enteric tube tip is in the proximal body of the stomach. Appendix appears normal. No evidence of bowel wall thickening, distention, or inflammatory changes. Vascular/Lymphatic: Aortic atherosclerosis. No enlarged abdominal or pelvic lymph nodes. Reproductive: Prostate is unremarkable. Other: There small fat containing inguinal hernias. There is mild diffuse body wall edema. Musculoskeletal: L3-L5 posterior fusion hardware is present IMPRESSION: 1. Multifocal pneumonia. 2. Mediastinal adenopathy, likely reactive. 3. Mild fat stranding surrounding the bladder. Correlate clinically for cystitis. 4. Mild diffuse body wall edema. 5. Aortic atherosclerosis. Aortic Atherosclerosis (ICD10-I70.0). Electronically Signed   By: Darliss Cheney M.D.   On: 05/09/2023 18:17   Portable Chest x-ray Result Date: 05/09/2023 CLINICAL DATA:  Intubation. EXAM: PORTABLE CHEST 1 VIEW COMPARISON:  Chest radiograph dated 02/25. FINDINGS: Endotracheal tube with tip approximately 3.5 cm above the carina. Enteric tube extends below the diaphragm with tip beyond the inferior margin of the image. Additional support a products in similar position. Evaluation of the lungs is limited due to overlying cover. Bilateral mid to lower lung field interstitial densities may represent atelectasis or infiltrate. No pleural effusion or pneumothorax. Stable cardiac silhouette. No acute osseous pathology. IMPRESSION: 1. Endotracheal  tube with tip approximately 3.5 cm above the carina. 2. Bilateral mid to lower lung field atelectasis or infiltrate. Electronically Signed   By: Elgie Collard M.D.   On: 05/09/2023 14:20   DG CHEST PORT 1 VIEW Result Date: 05/08/2023 CLINICAL DATA:  Central line placement EXAM: PORTABLE CHEST 1 VIEW COMPARISON:  05/06/2023 FINDINGS: Interval placement of left internal jugular approach central venous catheter with distal tip terminating at the level of the distal SVC. Interval placement of a right PICC line with distal tip terminating at the level of the superior cavoatrial junction. Stable heart size. Pulmonary vascular congestion. Mildly prominent interstitial markings bilaterally. No pleural effusion or pneumothorax. IMPRESSION: 1. Interval placement of left IJ central venous catheter and right PICC line. No pneumothorax. 2. Pulmonary vascular congestion and mild interstitial edema. Electronically Signed   By: Duanne Guess D.O.   On: 05/08/2023 11:10   CT HEAD WO CONTRAST ( ) Result Date: 05/07/2023 CLINICAL DATA:  Neuro deficit, acute, stroke suspected EXAM: CT HEAD WITHOUT CONTRAST TECHNIQUE: Contiguous axial images were obtained from the base of the skull through the vertex without intravenous contrast. RADIATION DOSE REDUCTION: This exam was performed according to the departmental dose-optimization program which includes automated exposure control, adjustment of the mA and/or kV according to patient size and/or use of iterative reconstruction technique. COMPARISON:  CT head 05/06/2023. FINDINGS: Brain: No evidence of acute infarction, hemorrhage, hydrocephalus, extra-axial collection or mass lesion/mass effect. Similar patchy white matter hypodensities, nonspecific but compatible with chronic microvascular disease. Vascular: No hyperdense vessel Skull: No acute fracture. Sinuses/Orbits: Mostly clear sinuses.  No acute orbital findings. Other: No mastoid effusions. IMPRESSION: No evidence of acute  intracranial abnormality. An MRI could provide more sensitive evaluation for acute infarct if clinically warranted Electronically Signed   By: Feliberto Harts M.D.   On: 05/07/2023 22:32   [place] EKG Site Rite Result Date: 05/07/2023 If Site Rite image not attached, placement could not be confirmed due to current cardiac rhythm.  US ABDOMEN LIMITED WITH LIVER DOPPLER Result Date: 05/07/2023 CLINICAL DATA:  Elevated LFTs EXAM: DUPLEX ULTRASOUND OF LIVER TECHNIQUE: Color and duplex Doppler ultrasound was performed to evaluate the hepatic in-flow and out-flow vessels. COMPARISON:  None available FINDINGS: Liver: Normal parenchymal echogenicity. Normal hepatic contour without nodularity. No focal lesion, mass or intrahepatic biliary ductal dilatation. Gallbladder  physiologically distended without stones or wall thickening. Main Portal Vein size: 1 cm Portal Vein Velocities (all hepatopetal): Main Prox:  15 cm/sec Main Mid: 17 cm/sec Main Dist:  16 cm/sec Right: 22 cm/sec Left: 22 cm/sec Hepatic Vein Velocities (all hepatofugal): Right:  20 cm/sec Middle:  48 cm/sec Left:  55 cm/sec IVC: Present and patent, velocity 23 cm/sec. Hepatic Artery Velocity: Not identified Splenic Vein Velocity:  21 cm/sec Spleen: 14.9 cm x 13.6 cm x 5.6 cm with a total volume of 597 cm^3 (411 cm^3 is upper limit normal) Portal Vein Occlusion/Thrombus: No Splenic Vein Occlusion/Thrombus: No Ascites: None Varices: None Technologist describes technically difficult study secondary to morbid obesity, overlying bowel gas, patient inability to move, and breathing artifact. IMPRESSION: 1. Patent portal and hepatic veins and hepatic with normal direction of flow. 2. Splenomegaly. Electronically Signed   By: Corlis Leak M.D.   On: 05/07/2023 08:05   DG Chest Port 1 View Result Date: 05/06/2023 CLINICAL DATA:  Dyspnea EXAM: PORTABLE CHEST 1 VIEW COMPARISON:  05/04/2023 FINDINGS: Mild cardiomegaly. No focal airspace consolidation or pulmonary edema.  No pleural effusion or pneumothorax. IMPRESSION: Mild cardiomegaly. Electronically Signed   By: Deatra Robinson M.D.   On: 05/06/2023 23:25   CT HEAD WO CONTRAST ( ) Result Date: 05/06/2023 CLINICAL DATA:  Altered mental status EXAM: CT HEAD WITHOUT CONTRAST TECHNIQUE: Contiguous axial images were obtained from the base of the skull through the vertex without intravenous contrast. RADIATION DOSE REDUCTION: This exam was performed according to the departmental dose-optimization program which includes automated exposure control, adjustment of the mA and/or kV according to patient size and/or use of iterative reconstruction technique. COMPARISON:  None Available. 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 No mass, hemorrhage or extra-axial collection. Vascular: No hyperdense vessel or unexpected vascular calcification. Skull: The visualized skull base, calvarium and extracranial soft tissues are normal. Sinuses/Orbits: Right maxillary sinus retention cyst. Other: None. IMPRESSION: 1. No acute intracranial abnormality. 2. 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 Electronically Signed   By: Deatra Robinson M.D.   On: 05/06/2023 20:36   ECHOCARDIOGRAM COMPLETE Result Date: 05/05/2023    ECHOCARDIOGRAM REPORT   Patient Name:   TRAVIUS CROCHET [facility] Date of Exam: 05/05/2023 Medical Rec #:  [id number]         Height:       73.0 in Accession #:    [id number]        Weight:       328.0 lb Date of Birth:  [birth year]         BSA:          2.655 m Patient Age:    58 years          BP:           171/97 mmHg Patient Gender: M                 HR:           85 bpm. Exam Location:  Inpatient Procedure: 2D Echo, Color Doppler, Cardiac Doppler and Intracardiac            Opacification Agent Indications:    Congestive Heart Failure I50.9  History:        Patient has no prior history of Echocardiogram examinations.                 CHF;  Risk Factors:Hypertension, Diabetes  and HLD.  Sonographer:    Webb Laws Referring Phys: [id number] ARSHAD N KAKRAKANDY IMPRESSIONS  1. Cannot accurately estimate EF despite addition fo Definity contrast due to forshortened views and shadowing. The left ventricle demonstrates regional wall motion abnormalities (see scoring diagram/findings for description). There is hypokinesis of the apex and mid to distal anterior wall and anteroseptum.There is mild asymmetric left ventricular hypertrophy. Left ventricular diastolic parameters are indeterminate. Elevated left ventricular end-diastolic pressure.  2. Right ventricular systolic function is normal. The right ventricular size is normal. There is mildly elevated pulmonary artery systolic pressure. The estimated right ventricular systolic pressure is 39.4 mmHg.  3. The mitral valve is normal in structure. Mild mitral valve regurgitation. No evidence of mitral stenosis.  4. The aortic valve is normal in structure. Aortic valve regurgitation is not visualized. No aortic stenosis is present.  5. The inferior vena cava is dilated in size with <50% respiratory variability, suggesting right atrial pressure of 15 mmHg.  6. Consider repeat limited echo vs cardiac MRI for further assessment of EF FINDINGS  Left Ventricle: Cannot accurately estimate EF despite definity contrast due to poor image quality, forshortened views and shadowing. The left ventricle demonstrates regional wall motion abnormalities. Definity contrast agent was given IV to delineate the left ventricular endocardial borders. The left ventricular internal cavity size was normal in size. There is mild asymmetric left ventricular hypertrophy. Left ventricular diastolic parameters are indeterminate. Elevated left ventricular end-diastolic pressure. Right Ventricle: The right ventricular size is normal. No increase in right ventricular wall thickness. Right ventricular systolic function is normal. There is mildly  elevated pulmonary artery systolic pressure. The tricuspid regurgitant velocity is 2.47  m/s, and with an assumed right atrial pressure of 15 mmHg, the estimated right ventricular systolic pressure is 39.4 mmHg. Left Atrium: Left atrial size was normal in size. Right Atrium: Right atrial size was normal in size. Pericardium: There is no evidence of pericardial effusion. Mitral Valve: The mitral valve is normal in structure. Mild mitral valve regurgitation. No evidence of mitral valve stenosis. Tricuspid Valve: The tricuspid valve is normal in structure. Tricuspid valve regurgitation is mild . No evidence of tricuspid stenosis. Aortic Valve: The aortic valve is normal in structure. Aortic valve regurgitation is not visualized. No aortic stenosis is present. Pulmonic Valve: The pulmonic valve was normal in structure. Pulmonic valve regurgitation is mild. No evidence of pulmonic stenosis. Aorta: The aortic root is normal in size and structure. Venous: The inferior vena cava is dilated in size with less than 50% respiratory variability, suggesting right atrial pressure of 15 mmHg. IAS/Shunts: No atrial level shunt detected by color flow Doppler.  LEFT VENTRICLE PLAX 2D LVIDd:         5.60 cm      Diastology LVIDs:         4.30 cm      LV e' medial:    4.24 cm/s LV PW:         1.20 cm      LV E/e' medial:  21.5 LV IVS:        1.20 cm      LV e' lateral:   6.31 cm/s LVOT diam:     2.00 cm      LV E/e' lateral: 14.5 LV SV:         27 LV SV Index:   10 LVOT Area:     3.14 cm  LV Volumes (MOD) LV vol d, MOD A2C: 162.0 ml LV vol d,  MOD A4C: 183.0 ml LV vol s, MOD A2C: 127.0 ml LV vol s, MOD A4C: 114.0 ml LV SV MOD A2C:     35.0 ml LV SV MOD A4C:     183.0 ml LV SV MOD BP:      56.9 ml RIGHT VENTRICLE            IVC RV S prime:     7.07 cm/s  IVC diam: 3.30 cm TAPSE (M-mode): 2.2 cm LEFT ATRIUM             Index        RIGHT ATRIUM           Index LA diam:        4.40 cm 1.66 cm/m   RA Area:     19.40 cm LA Vol (A2C):    38.0 ml 14.31 ml/m  RA Volume:   55.20 ml  20.79 ml/m LA Vol (A4C):   41.7 ml 15.71 ml/m LA Biplane Vol: 40.9 ml 15.40 ml/m  AORTIC VALVE             PULMONIC VALVE LVOT Vmax:   57.50 cm/s  PR End Diast Vel: 1.70 msec LVOT Vmean:  40.900 cm/s LVOT VTI:    0.086 m  AORTA Ao Root diam: 3.40 cm Ao Asc diam:  3.30 cm MITRAL VALVE               TRICUSPID VALVE MV Area (PHT): 9.37 cm    TR Peak grad:   24.4 mmHg MV Decel Time: 81 msec     TR Vmax:        247.00 cm/s MV E velocity: 91.30 cm/s MV A velocity: 59.10 cm/s  SHUNTS MV E/A ratio:  1.54        Systemic VTI:  0.09 m                            Systemic Diam: 2.00 cm Armanda Magic MD Electronically signed by Armanda Magic MD Signature Date/Time: 05/05/2023/1:20:27 PM    Final    VAS [place] LOWER EXTREMITY VENOUS (DVT) Result Date: 05/05/2023  Lower Venous DVT Study Patient Name:  FORTUNE BRANNIGAN [facility]  Date of Exam:   05/05/2023 Medical Rec #: [id number]          Accession #:    [id number] Date of Birth: 01-Jun-1965          Patient Gender: M Patient Age:   68 years Exam Location:  [facility] Procedure:      VAS [place] LOWER EXTREMITY VENOUS (DVT) Referring Phys: Midge Minium --------------------------------------------------------------------------------  Indications: Edema.  Risk Factors: Obesity. Limitations: Body habitus and bandages. Comparison Study: None. Performing Technologist: Shona Simpson  Examination Guidelines: A complete evaluation includes B-mode imaging, spectral Doppler, color Doppler, and power Doppler as needed of all accessible portions of each vessel. Bilateral testing is considered an integral part of a complete examination. Limited examinations for reoccurring indications may be performed as noted. The reflux portion of the exam is performed with the patient in reverse Trendelenburg.  +---------+---------------+---------+-----------+----------+-------------------+ RIGHT    CompressibilityPhasicitySpontaneityPropertiesThrombus Aging       +---------+---------------+---------+-----------+----------+-------------------+ CFV      Full           Yes      Yes                                      +---------+---------------+---------+-----------+----------+-------------------+  SFJ      Full                                                             +---------+---------------+---------+-----------+----------+-------------------+ FV Prox  Full                                                             +---------+---------------+---------+-----------+----------+-------------------+ FV Mid   Full                                                             +---------+---------------+---------+-----------+----------+-------------------+ FV DistalFull                                         Not well visualized +---------+---------------+---------+-----------+----------+-------------------+ PFV      Full                                                             +---------+---------------+---------+-----------+----------+-------------------+ POP      Full           Yes      Yes                                      +---------+---------------+---------+-----------+----------+-------------------+ PTV      Full                                                             +---------+---------------+---------+-----------+----------+-------------------+ PERO     Full                                         Not well visualized +---------+---------------+---------+-----------+----------+-------------------+   +---------+---------------+---------+-----------+----------+-------------------+ LEFT     CompressibilityPhasicitySpontaneityPropertiesThrombus Aging      +---------+---------------+---------+-----------+----------+-------------------+ CFV      Full           Yes      Yes                                      +---------+---------------+---------+-----------+----------+-------------------+  SFJ      Full                                                             +---------+---------------+---------+-----------+----------+-------------------+  FV Prox  Full                                                             +---------+---------------+---------+-----------+----------+-------------------+ FV Mid   Full                                                             +---------+---------------+---------+-----------+----------+-------------------+ FV DistalFull                                         Not well visualized +---------+---------------+---------+-----------+----------+-------------------+ PFV      Full                                                             +---------+---------------+---------+-----------+----------+-------------------+ POP      Full           Yes      Yes                                      +---------+---------------+---------+-----------+----------+-------------------+ PTV      Full                                                             +---------+---------------+---------+-----------+----------+-------------------+ PERO                             Yes                  Not well visualized +---------+---------------+---------+-----------+----------+-------------------+     Summary: BILATERAL: - No evidence of deep vein thrombosis seen in the lower extremities, bilaterally. -No evidence of popliteal cyst, bilaterally.   *See table(s) above for measurements and observations. Electronically signed by Sherald Hess MD on 05/05/2023 at 11:32:53 AM.    Final    DG Chest 2 View Result Date: 05/04/2023 CLINICAL DATA:  Short of breath EXAM: CHEST - 2 VIEW COMPARISON:  None Available. FINDINGS: Normal cardiac silhouette. Central venous congestion. Small pleural effusions. No overt pulmonary edema. No focal consolidation. No pneumothorax. No acute osseous abnormality. IMPRESSION: Central venous congestion and small  pleural effusions. Electronically Signed   By: Genevive Bi M.D.   On: 05/04/2023 18:00    Labs:  CBC: Recent Labs    05/16/23 0335 05/17/23 0428 05/18/23 0540 05/19/23 0448  WBC 12.4* 10.8* 14.7* 14.8*  HGB 13.4 12.1* 11.6* 11.3*  HCT 40.2 36.3* 35.5* 34.5*  PLT 172 174 205 263    COAGS: Recent Labs    05/16/23 0335  05/16/23 1303 05/16/23 1523 05/17/23 0843 05/18/23 0540 05/19/23 0448  INR 1.8*  --   --  1.6* 1.5* 1.5*  APTT 86*   < > 70* 71* 69* 70*   < > = values in this interval not displayed.    BMP: Recent Labs    05/16/23 0335 05/17/23 0428 05/18/23 0540 05/19/23 0448  NA 131* 134* 130* 132*  K 4.6 4.2 4.4 4.8  CL 91* 98 95* 95*  CO2 20* 23 22 22   GLUCOSE 271* 202* 313* 241*  BUN 95* 54* 84* 105*  CALCIUM 8.8* 8.1* 8.1* 8.5*  CREATININE 6.86* 4.66* 6.31* 6.74*  GFRNONAA 9* 14* 10* 9*    LIVER FUNCTION TESTS: Recent Labs    05/12/23 0506 05/12/23 1628 05/13/23 0414 05/13/23 1643 05/14/23 0410 05/14/23 1627 05/16/23 0335 05/17/23 0428 05/18/23 0540 05/19/23 0448  BILITOT 3.3*  --  2.8*  --  2.5*  --  2.0*  --   --   --   AST 261*  --  113*  --  55*  --  35  --   --   --   ALT 307*  --  206*  --  113*  --  40  --   --   --   ALKPHOS 116  --  105  --  97  --  85  --   --   --   PROT 7.0  --  7.1  --  6.4*  --  5.9*  --   --   --   ALBUMIN 2.5*  2.6*   < > 2.6*  2.6*   < > 2.3*   < > 2.3* 2.4* 2.1* 2.1*   < > = values in this interval not displayed.    TUMOR MARKERS: No results for input(s): "AFPTM", "CEA", "CA199", "CHROMGRNA" in the last 8760 hours.  Assessment and Plan: Patient was admitted on 1/29 from ED with mixed septic and cardiogenic shock.  Patient developed delirium and worsening respiratory failure and was intubated on 05/09/2023.  He was extubated on 05/11/2023 with general improvement noted.  Cortrak tube was placed for nutrition.  Course complicated by acute kidney injury requiring dialysis for management. Dialysis  catheter was pulled on 2/12.  Patient presents for scheduled tunneled dialysis catheter placement in IR today.   Risks and benefits discussed with the patient including, but not limited to bleeding, infection, vascular injury, pneumothorax which may require chest tube placement, air embolism or even death  All of the patient's questions were answered, patient is agreeable to proceed. Consent signed and in chart.  Thank you for allowing our service to participate in Dylan Owen 's care.  Electronically Signed: Sable Feil, PA-C   05/19/2023, 2:02 PM      I spent a total of 40 Minutes in face to face in clinical consultation, greater than 50% of which was counseling/coordinating care for vascular access issue and hemodialysis with consideration for tunneled dialysis catheter placement.

## 2023-05-19 NOTE — Progress Notes (Signed)
 NAME:  Dylan Owen, MRN:  [id number], DOB:  1965-05-23, LOS: 14 ADMISSION DATE:  05/04/2023, CONSULTATION DATE:  05/07/2023 REFERRING MD:  Elvera Lennox - TRH, CHIEF COMPLAINT:  Resp failure shock    History of Present Illness:  58 year old male with PMHx significant for obesity, hypertension, hyperlipidemia, type 2 diabetes, sleep apnea, chronic back pain, depression, acid reflux.  Also depression significant home medications include Lasix, hydrochlorothiazide.  Lisinopril, metoprolol, nitroglycerin but also Robaxin and MS Contin 30 mg twice daily along with Repatha.  And Effexor.  Along with insulin for diabetes.  She he walks around with a cane at baseline.  He also admits to medication noncompliance.  And 10 pound weight gain he is not on oxygen at home.  His hemoglobin A1c was 10.2 at admission consistent with very poor compliance.   Admitted on 05/04/2023 with complaints of cough, sore throat, dizziness and bodyaches for 1 week.  Also included chest tightness headaches chills diaphoresis.  At the time of arrival to the ED also had dyspnea on exertion.  At admission BNP was 1275.  Respiratory virus panel was negative.  He was started on Lasix 60 twice daily and his home lisinopril.  He was initially hypertensive and with this management blood pressure improved.  Echocardiogram showed low ejection fraction approximately 25% although poor acoustic windows.  Chest x-ray suggestive of venous congestion.  Cardiology consultation 05/06/2023 noted net -1.6 L since admission and patient was given history of increased frequency of urination.  Diagnose of viral myocarditis was made and left a right heart catheterization was considered.  Troponin barely elevated and consistent with viral myocarditis.   Postadmission with diuresis as of 05/06/2023 creatinine rose up to 1.57 mg percent [baseline 1 mg percent].  At this point lisinopril was held.  811/31/25 developed hypoglycemia requiring infusion of dextrose and  transferred to the intensive care unit.  Post transfer to the intensive care unit he developed acute fever and also BiPAP dependency.  Along with worsening renal failure further to creatinine of 2.83 mg percent along with persistent lactic acidosis around 3.  And mild intermittent delirium.  Overnight diaphoresis reported but despite all this nursing consistently reports that his periphery is extremely cold.   Cardiology has been reconsulted.  Critical care medicine consulted.  Pertinent Medical History:  B12 deficiency, Blood transfusion without reported diagnosis, Chronic low back pain, Depression, Diabetes mellitus with complication (HCC), GERD (gastroesophageal reflux disease), Heart murmur, History of adenomatous polyp of colon (05/22/2021), Hypercholesterolemia, Hypertension, OSA (obstructive sleep apnea), Peripheral neuropathy, Persistent asthma, Seizures (HCC), Stable angina (HCC), Statin intolerance, and Subclinical hypothyroidism (06/2017).    reports that he has never smoked. His smokeless tobacco use includes chew.  Significant Hospital Events: Including procedures, antibiotic start and stop dates in addition to other pertinent events   1/29 Admit to [facility], diuresis 1/30 ECHO w LVEF 25% 1/31 Cards consult, c/f viral myocarditis  2/1 PCCM consult txf to [facility], Adv HF consult  2/2 For HD cath placement and CRRT. Incr D10 for persistent hypogly   2/3 Milrinone stopped. Weaning NE; intubated due to being on bipap for a few days and needing to go to CT  2/5 Extubated. Cleviprex for hypertension   2/6 CT Head for anisocoria, no acute findings. Off clevi. Adding PRN metop. CRRT UF decr to 150/hr.  2/7 On BiPAP. RHC and DCCV  2/8 Off crrt  2/9 Albumin + lasix challenge. HIT neg   2/10: got iHD 2/12: HD cath pulled by pt overnight. Ordered  IR for [facility]. Holding bivalirudin  2/13: did not get TDC yesterday because WBC 14. Reorder for [facility] today. If they are not able to work him in, will have to  place temp cath to start iHD.   Interim History / Subjective:  Was to get [facility] yesterday but declined for WBC count? Sent off procalcitonin this AM but I have low suspicion that he is highly infective. Will have to place temp cath this afternoon if IR cannot work him in for [facility] to restart dialysis.  Objective:  Blood pressure 110/62, pulse 90, temperature 98 F (36.7 C), resp. rate 16, height 6\' 1"  (1.854 m), weight 133.4 kg, SpO2 95%. CVP:  [18 mmHg] 18 mmHg  FiO2 (%):  [21 %-40 %] 40 %   Intake/Output Summary (Last 24 hours) at 05/19/2023 0740 Last data filed at 05/19/2023 0600 Gross per 24 hour  Intake 942.43 ml  Output 885 ml  Net 57.43 ml   Filed Weights   05/17/23 0500 05/18/23 0500 05/19/23 0500  Weight: 130 kg 131.6 kg 133.4 kg   Physical Examination: General: acute on chronically ill appearing male laying in bed, no acute distress HEENT: anicteric sclera, PERRLA, mmm, abrasion on bridge of nose  Neuro: awake, alert, oriented x4. Non focal neurological exam CV: sinus rhythm to sinus tachy, no m/r/g PULM: even and unlabored, on 5LNC (down from 7), diminished in bases without wheezing, rhonchi  GI: rounded, soft, ntnd, +BS Extremities: 1-2+ BLE pitting edema Skin: warm/dry. Abrasion to bridge of nose. Chronic changes to skin in BLE  Resolved Hospital Problem List:   Hypomagnesemia Shock Acute metabolic encephalopathy  Assessment & Plan:  Sepsis 2/2 UTI, multifocal MSSA PNA , shock resolved  Initial concern for cardiogenic shock w HFrEF, possible viral myocarditis but coox et al have been assuring against. Course has been most c/w septic shock, compounded by septic cardiomyopathy + pHTN, sequelae of multisystem organ dysfxn. Cx have not identified an org, however clinically he continued to decline until we broadened to [facility], and turned around pretty rapidly thereafter.  - completed 7 day course of mero with improvement  - culture date with MSSA, broadly sensitive  - trend  fever, wbc curve (WBC up to 14 on 2/12 and flat 2/13) - f/u repeat procal, no fevers   Acute hypoxic and hypercarbic resp failure MSSA PNA Hx OSA non on CPAP  - Supplemental O2 support for SpO2 > 90%, oxygen requirements decreasing, now on 5LNC - resp status would benefit from volume removal with dialysis  - BiPAP PRN + at bedtime - Pulmonary hygiene, encourage OOB/mobility - Outpatient sleep evaluation recommended  New Afib/flutter s/p DCCV 2/7 -- now sinus  Acute (possibly on chronic) HFrEF-- septic cardiomyopathy  pHTN (groud II and III) Initial c/f myocarditis but less favored as course/workup went on. Repeat Echo EF 40-45%. RHC with normal R and L filling pressures, elevated PA mean and PVR. - Amiodarone transitioned to PO - Bivalrudin for AC, HIT Ab negative - holding now for IR TDC placement  - Cardiac monitoring - Optimize electrolytes for K > 4, Mg > 2 - Encourage NIV compliance - Volume optimization with iHD as tolerated  Thrombocytopenia, improving  HIT ruled out  HIT ab 0.085, WNL. - bival - on hold for [facility] placement then can continue  - Consider DOAC once renal function stabilizes  AKI likely ATN AGMA, resolved  Tolerated iHD 2/10 for clearance, unable to pull fluid due to soft BP; Nephro planning to make adjustments to  see if next session is better tolerated - Nephro following, appreciate assistance - needs TDC. Coordinating with IR. He did not receive dialysis 2/12 due to IR rejecting him. Nephro okay with waiting until 2/13. IR to try and fit in him today, if not ccm to place temp cath again  - Trend BMP - Replete electrolytes as indicated - Monitor I&Os - Avoid nephrotoxic agents as able - Ensure adequate renal perfusion  Elevated LFTs, improving mild hyperbilirubinemia mild hyperammonemia  Acute hep neg. Shock liver most favored. Possible congestive hepatopathy component but if septic CM less favored   - LFTs grossly improved - Intermittent  monitoring  DM2 - cbg QID before meals and at bedtime  - semglee increased to 20 U BID (takes ~150U long acting at home) - novolog 4U @ meals  - SSI changed to resistant coverage for meals  - con't HS SSI coverage  Code status discussion / GOC  Spoke w wife 2/3 who verified Full code/ full scope of practice - Remains full code  Best Practice (right click and "Reselect all SmartList Selections" daily)   Diet/type: Regular consistency (see orders) DVT prophylaxis bival  Pressure ulcer(s): pressure ulcer assessment deferred  GI prophylaxis: H2B Lines:  Central line and yes and it is still needed - PICC  Foley:  Yes, and it is still needed  Code Status:  full code Last date of multidisciplinary goals of care discussion [Wife updated via phone 2/11]  Critical care time: 32   Cristopher Peru, PA-C [facility] 05/19/23 7:40 AM  Please see [email or web address] for pager details.  From 7A-7P if no response, please call [phone] After hours, please call ELink [phone]

## 2023-05-19 NOTE — Progress Notes (Signed)
PHARMACY - ANTICOAGULATION Pharmacy Consult for bivalirudin Indication: Atrial flutter  Allergies  Allergen Reactions   Ace Inhibitors     Kidney failure   Atorvastatin Other (See Comments)    Arm pain   Prednisone     REACTION: Anxiety   Septra [Sulfamethoxazole-Trimethoprim]    Metformin And Related Diarrhea    Patient Measurements: Height: 6\' 1"  (185.4 cm) Weight: 133.4 kg (294 lb 1.5 oz) IBW/kg (Calculated) : 79.9  Vital Signs: Temp: 98 F (36.7 C) (02/13 0700) Temp Source: Oral (02/13 0700) BP: 110/62 (02/13 0700) Pulse Rate: 90 (02/13 0700)  Labs: Recent Labs    05/17/23 0428 05/17/23 0843 05/18/23 0540 05/19/23 0448  HGB 12.1*  --  11.6* 11.3*  HCT 36.3*  --  35.5* 34.5*  PLT 174  --  205 263  APTT  --  71* 69* 70*  LABPROT  --  19.0* 18.4* 17.9*  INR  --  1.6* 1.5* 1.5*  CREATININE 4.66*  --  6.31* 6.74*    Estimated Creatinine Clearance: 17.3 mL/min (A) (by C-G formula based on SCr of 6.74 mg/dL (H)).  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  aPTT is therapeutic at 70 seconds. H/H, pltc ok, HIT negative. Planning DOAC once tunneled HD line placed.  Goal of Therapy:  aPTT 50-85 seconds Monitor platelets by anticoagulation protocol: Yes   Plan:  Continue bivalirudin at 0.045 mg/kg/hr (dosing wt 127 kg) Daily aPTT, CBC  Fredonia Highland, PharmD, BCPS, [facility] Clinical Pharmacist [phone] Please check AMION for all [facility] Pharmacy numbers 05/19/2023

## 2023-05-19 NOTE — Progress Notes (Signed)
PHARMACY - ANTICOAGULATION Pharmacy Consult for bivalirudin >> apixaban Indication: Atrial flutter  Allergies  Allergen Reactions   Ace Inhibitors     Kidney failure   Atorvastatin Other (See Comments)    Arm pain   Prednisone     REACTION: Anxiety   Septra [Sulfamethoxazole-Trimethoprim]    Metformin And Related Diarrhea    Patient Measurements: Height: 6\' 1"  (185.4 cm) Weight: 133.4 kg (294 lb 1.5 oz) IBW/kg (Calculated) : 79.9  Vital Signs: Temp: 97.6 F (36.4 C) (02/13 1535) Temp Source: Oral (02/13 1535) BP: 130/68 (02/13 1535) Pulse Rate: 95 (02/13 1535)  Labs: Recent Labs    05/17/23 0428 05/17/23 0843 05/18/23 0540 05/19/23 0448  HGB 12.1*  --  11.6* 11.3*  HCT 36.3*  --  35.5* 34.5*  PLT 174  --  205 263  APTT  --  71* 69* 70*  LABPROT  --  19.0* 18.4* 17.9*  INR  --  1.6* 1.5* 1.5*  CREATININE 4.66*  --  6.31* 6.74*    Estimated Creatinine Clearance: 17.3 mL/min (A) (by C-G formula based on SCr of 6.74 mg/dL (H)).  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  Was therapeutic on bivalirudin at 0.045 mg/kg/hr until this morning. Underwent placement of tunneled dialysis catheter today - discussed with MD, okay to do DOAC tonight. Given age<80 and wt>60 kg despite Scr>1.5 qualifies for 5 mg twice daily.   Goal of Therapy:  Monitor platelets by anticoagulation protocol: Yes   Plan:  Start apixaban 5 mg BID tonight Monitor CBC and for s/sx of bleeding  Thank you for allowing pharmacy to participate in this patient's care,  Sherron Monday, PharmD, BCCCP Clinical Pharmacist  Phone: [phone] 05/19/2023 4:34 PM  Please check AMION for all [facility] Pharmacy phone numbers After 10:00 PM, call Main Pharmacy [phone]

## 2023-05-19 NOTE — Plan of Care (Signed)
 Problem: Education: Goal: Ability to describe self-care measures that may prevent or decrease complications (Diabetes Survival Skills Education) will improve Outcome: Progressing Goal: Individualized Educational Video(s) Outcome: Progressing   Problem: Coping: Goal: Ability to adjust to condition or change in health will improve Outcome: Progressing   Problem: Fluid Volume: Goal: Ability to maintain a balanced intake and output will improve Outcome: Progressing   Problem: Health Behavior/Discharge Planning: Goal: Ability to identify and utilize available resources and services will improve Outcome: Progressing Goal: Ability to manage health-related needs will improve Outcome: Progressing   Problem: Metabolic: Goal: Ability to maintain appropriate glucose levels will improve Outcome: Progressing   Problem: Nutritional: Goal: Maintenance of adequate nutrition will improve Outcome: Progressing Goal: Progress toward achieving an optimal weight will improve Outcome: Progressing   Problem: Skin Integrity: Goal: Risk for impaired skin integrity will decrease Outcome: Progressing   Problem: Tissue Perfusion: Goal: Adequacy of tissue perfusion will improve Outcome: Progressing   Problem: Education: Goal: Knowledge of General Education information will improve Description: Including pain rating scale, medication(s)/side effects and non-pharmacologic comfort measures Outcome: Progressing   Problem: Health Behavior/Discharge Planning: Goal: Ability to manage health-related needs will improve Outcome: Progressing   Problem: Clinical Measurements: Goal: Ability to maintain clinical measurements within normal limits will improve Outcome: Progressing Goal: Will remain free from infection Outcome: Progressing Goal: Diagnostic test results will improve Outcome: Progressing Goal: Respiratory complications will improve Outcome: Progressing Goal: Cardiovascular complication will  be avoided Outcome: Progressing   Problem: Activity: Goal: Risk for activity intolerance will decrease Outcome: Progressing   Problem: Nutrition: Goal: Adequate nutrition will be maintained Outcome: Progressing   Problem: Coping: Goal: Level of anxiety will decrease Outcome: Progressing   Problem: Elimination: Goal: Will not experience complications related to bowel motility Outcome: Progressing Goal: Will not experience complications related to urinary retention Outcome: Progressing   Problem: Pain Managment: Goal: General experience of comfort will improve and/or be controlled Outcome: Progressing   Problem: Safety: Goal: Ability to remain free from injury will improve Outcome: Progressing   Problem: Skin Integrity: Goal: Risk for impaired skin integrity will decrease Outcome: Progressing   Problem: Education: Goal: Knowledge of General Education information will improve Description: Including pain rating scale, medication(s)/side effects and non-pharmacologic comfort measures Outcome: Progressing   Problem: Health Behavior/Discharge Planning: Goal: Ability to manage health-related needs will improve Outcome: Progressing   Problem: Clinical Measurements: Goal: Ability to maintain clinical measurements within normal limits will improve Outcome: Progressing Goal: Will remain free from infection Outcome: Progressing Goal: Diagnostic test results will improve Outcome: Progressing Goal: Respiratory complications will improve Outcome: Progressing Goal: Cardiovascular complication will be avoided Outcome: Progressing   Problem: Activity: Goal: Risk for activity intolerance will decrease Outcome: Progressing   Problem: Nutrition: Goal: Adequate nutrition will be maintained Outcome: Progressing   Problem: Coping: Goal: Level of anxiety will decrease Outcome: Progressing   Problem: Elimination: Goal: Will not experience complications related to bowel  motility Outcome: Progressing Goal: Will not experience complications related to urinary retention Outcome: Progressing   Problem: Pain Managment: Goal: General experience of comfort will improve and/or be controlled Outcome: Progressing   Problem: Safety: Goal: Ability to remain free from injury will improve Outcome: Progressing   Problem: Skin Integrity: Goal: Risk for impaired skin integrity will decrease Outcome: Progressing   Problem: Education: Goal: Knowledge of disease and its progression will improve Outcome: Progressing   Problem: Health Behavior/Discharge Planning: Goal: Ability to manage health-related needs will improve Outcome: Progressing   Problem: Clinical Measurements:  Goal: Complications related to the disease process or treatment will be avoided or minimized Outcome: Progressing Goal: Dialysis access will remain free of complications Outcome: Progressing

## 2023-05-19 NOTE — Progress Notes (Signed)
Patient ID: Dylan Owen, male   DOB: 11/26/1965, 58 y.o.   MRN: [id number]  [facility] Progress Note   Assessment/ Plan:   1. Acute kidney Injury: With essentially normal renal function at baseline (creatinine 1.1) and suffered ATN in the setting of shock.  He was oliguric overnight with some improvement of urine output but without functional renal recovery.  Will order for dialysis to maintain euvolemic state and manage azotemia.  - ordered [facility] for HD access - Lasix 120 IV BID while waiting - midodrine on board now - HD when access obtained- would like to do HD today given vol and labs 2.  Mixed cardiogenic/septic shock: Clinically suspected to be associated with viral myocarditis/acute CHF exacerbation.  CRRT discontinued over the weekend and he has been about 3-4 L net positive; urine output appears to be picking up and is charted at 275 cc overnight.  Will undertake dialysis today with goal of ultrafiltration 2.5 L as tolerated by his blood pressure. 3.  Shock liver: Transaminases appear to be trending down with hemodynamic support.  Hemodynamically improving. 4.  Acute hypoxic/hypercarbic respiratory failure: Tolerating supplemental oxygen via nasal cannula after earlier requiring intubation/ventilator support and intermittent NIPPV.  Subjective:    Cr still continues to trend up- needs either [facility] or HD cath so we can do HD today- PCCM and IR on board.     Objective:   BP 110/62   Pulse 90   Temp 98 F (36.7 C) (Oral)   Resp 16   Ht 6\' 1"  (1.854 m)   Wt 133.4 kg   SpO2 95%   BMI 38.80 kg/m   Intake/Output Summary (Last 24 hours) at 05/19/2023 1012 Last data filed at 05/19/2023 0800 Gross per 24 hour  Intake 342.4 ml  Output 945 ml  Net -602.6 ml   Weight change: 1.8 kg  Physical Exam: Gen: Awake and alert, watching television CVS: Pulse regular rhythm, normal rate, S1 and S2 normal Resp: Anteriorly clear to auscultation, no rales/rhonchi Abd: Soft,  obese, nontender, bowel sounds normal Ext: 1+ bilateral lower extremity edema with skin wrinkling, trace-1+ upper extremity edema with some dependent edema  Imaging: No results found.   Labs: BMET Recent Labs  Lab 05/14/23 0410 05/14/23 1627 05/15/23 0421 05/16/23 0335 05/17/23 0428 05/18/23 0540 05/19/23 0448  NA 135 128* 132* 131* 134* 130* 132*  K 4.3 4.7 4.3 4.6 4.2 4.4 4.8  CL 99 92* 97* 91* 98 95* 95*  CO2 20* 18* 19* 20* 23 22 22   GLUCOSE 188* 405* 179* 271* 202* 313* 241*  BUN 46* 62* 72* 95* 54* 84* 105*  CREATININE 4.05* 4.68* 5.47* 6.86* 4.66* 6.31* 6.74*  CALCIUM 8.2* 7.5* 8.0* 8.8* 8.1* 8.1* 8.5*  PHOS 5.3* 3.4 4.1 3.1 3.7 4.2 4.3   CBC Recent Labs  Lab 05/16/23 0335 05/17/23 0428 05/18/23 0540 05/19/23 0448  WBC 12.4* 10.8* 14.7* 14.8*  HGB 13.4 12.1* 11.6* 11.3*  HCT 40.2 36.3* 35.5* 34.5*  MCV 78.7* 78.9* 79.4* 79.9*  PLT 172 174 205 263    Medications:     amiodarone  200 mg Oral BID   aspirin EC  81 mg Oral Daily   bacitracin   Topical BID   Chlorhexidine Gluconate Cloth  6 each Topical Daily   famotidine  20 mg Oral Daily   feeding supplement  237 mL Oral BID BM   insulin aspart  0-20 Units Subcutaneous TID WC   insulin aspart  0-5 Units Subcutaneous QHS  insulin aspart  6 Units Subcutaneous TID WC   insulin glargine-yfgn  20 Units Subcutaneous BID   lidocaine  1 patch Transdermal Q24H   methocarbamol  500 mg Oral BID   midodrine  5 mg Oral TID WC   multivitamin  1 tablet Oral QHS   nicotine  14 mg Transdermal Daily   oxyCODONE  10 mg Oral Q6H    Bufford Buttner MD 05/19/2023, 10:12 AM

## 2023-05-19 NOTE — Progress Notes (Addendum)
Advanced Heart Failure Rounding Note  HF Cardiologist: Dr. Elwyn Lade  Chief Complaint: AKI  Patient Profile   58 y.o. male with a PMH of obesity, HTN, HLD, DM2, and OSA admitted w/ mixed septic and cardiogenic shock>>progression to anuric renal failure requiring CRRT.  Interval Hx:    2/3: worsening septic shock>> fever up to 101, increase LA, escalation of pressors, delirium and worsening respiratory failure>>intubated. Milrinone stopped due to frequent ectopy. Abx broadened. Vanc added to meropenum. CT of C/A/P w/ multifocal PNA and s/o cystitis. 2/4: Marked improvement. Resolution of fever and delirium. Weaned off pressors, became hypertensive and started on cleviprex gtt  2/5: Extubated, weaned off Cleviprex, Cortrak placed (post pyloric) 2/7: RHC, DCCV AFL to NSR.   Subjective:    900cc UOP with Lasix 120 mg IV bid.  BP stable on midodrine  Feeling good this morning. Sitting up in bed. No CP or SOB.   Objective:    Weight Range: 133.4 kg Body mass index is 38.8 kg/m.   Vital Signs:   Temp:  [97.8 F (36.6 C)-98.2 F (36.8 C)] 98 F (36.7 C) (02/13 0700) Pulse Rate:  [79-115] 90 (02/13 0700) Resp:  [14-26] 16 (02/13 0700) BP: (75-131)/(48-71) 110/62 (02/13 0700) SpO2:  [88 %-98 %] 95 % (02/13 0700) FiO2 (%):  [21 %-40 %] 40 % (02/12 2148) Weight:  [133.4 kg] 133.4 kg (02/13 0500) Last BM Date : 05/13/23  Weight change: Filed Weights   05/17/23 0500 05/18/23 0500 05/19/23 0500  Weight: 130 kg 131.6 kg 133.4 kg   Intake/Output:   Intake/Output Summary (Last 24 hours) at 05/19/2023 0804 Last data filed at 05/19/2023 0800 Gross per 24 hour  Intake 702.4 ml  Output 945 ml  Net -242.6 ml    Physical Exam   General: Weak appearing. No distress on Pax Cardiac: JVP ~7cm. S1 and S2 present. No murmurs or rub. Resp: Lung sounds clear and equal B/L Extremities: Warm and dry. No rash, cyanosis. Trace BLE peripheral edema.  Neuro: Alert and oriented x3. Affect  pleasant. Moves all extremities without difficulty. Lines/Devices:  RUE PICC  Telemetry: SR 80-90s (personally reviewed)  Labs    CBC Recent Labs    05/18/23 0540 05/19/23 0448  WBC 14.7* 14.8*  HGB 11.6* 11.3*  HCT 35.5* 34.5*  MCV 79.4* 79.9*  PLT 205 263   Basic Metabolic Panel Recent Labs    40/98/11 0540 05/19/23 0448  NA 130* 132*  K 4.4 4.8  CL 95* 95*  CO2 22 22  GLUCOSE 313* 241*  BUN 84* 105*  CREATININE 6.31* 6.74*  CALCIUM 8.1* 8.5*  MG 2.2 2.3  PHOS 4.2 4.3   Liver Function Tests Recent Labs    05/18/23 0540 05/19/23 0448  ALBUMIN 2.1* 2.1*   No results for input(s): "LIPASE", "AMYLASE" in the last 72 hours.  Cardiac Enzymes No results for input(s): "CKTOTAL", "CKMB", "CKMBINDEX", "TROPONINI" in the last 72 hours.  BNP: BNP (last 3 results) Recent Labs    05/04/23 1645  BNP 1,275.2*   Medications:    Scheduled Medications:  amiodarone  200 mg Oral BID   aspirin EC  81 mg Oral Daily   bacitracin   Topical BID   Chlorhexidine Gluconate Cloth  6 each Topical Daily   famotidine  20 mg Oral Daily   feeding supplement  237 mL Oral BID BM   insulin aspart  0-20 Units Subcutaneous TID WC   insulin aspart  0-5 Units Subcutaneous QHS  insulin aspart  6 Units Subcutaneous TID WC   insulin glargine-yfgn  20 Units Subcutaneous BID   lidocaine  1 patch Transdermal Q24H   methocarbamol  500 mg Oral BID   midodrine  5 mg Oral TID WC   multivitamin  1 tablet Oral QHS   nicotine  14 mg Transdermal Daily   oxyCODONE  10 mg Oral Q6H    Infusions:  sodium chloride Stopped (05/18/23 1838)   anticoagulant sodium citrate     bivalirudin (ANGIOMAX) 250 mg in sodium chloride 0.9 % 500 mL (0.5 mg/mL) infusion 0.045 mg/kg/hr (05/19/23 0800)   furosemide Stopped (05/18/23 1822)    PRN Medications: sodium chloride, acetaminophen (TYLENOL) oral liquid 160 mg/5 mL, albuterol, alteplase, anticoagulant sodium citrate, heparin, HYDROmorphone (DILAUDID)  injection, naLOXone (NARCAN)  injection, nitroGLYCERIN, ondansetron (ZOFRAN) IV, mouth rinse  Assessment/Plan   Mixed cardiogenic/septic shock: Presented febrile, cold on exam, with worsening blood pressure. TEE w/ moderately reduced LVEF. Initially on Milrinone. 2/3 developed worsening septic shock w/ fever, increase in lactic acid, delirium, and worsening respiratory failure>>intubated. Milrinone stopped due to frequent ectopy. CT of C/A/P w/ multifocal PNA and s/o cystitis. UA dirty, Ucx multiple species, Respiratory Cx rare MSSA, Bcx NG. - RHC 2/7: CI 2 to 2.3 L/min/m in AF. - Completed 7 day course Meropenem. - Resolved  Acute systolic CHF - Suspect stress CM in setting of sepsis - Echo 1/30: Unable to estimate EF d/t image quality, RWMA - Limited echo 02/06: EF 40-45%, RWMA, RV reduced - RHC 2/7 CI of 2-2.3 in AF. I suspect his LVEF will improve now that he is out of septic shock - No CVP monitoring set up. iHD held yesterday for further line holiday in the setting of leukocytosis. Needs line and iHD today - Diuretic challenege with Lasix 120 mg IV bid until able to place line per nephro - GDMT limited by hypotension and renal function - on midodrine 5 mg tid for HD  Acute hypoxic and hypercarbic respiratory failure Hx OSA - Secondary to PNA and acute heart failure. Intubated 2/3 - extubated 2/5 - continue nightly BiPAP/[place] during day. Currently on 5L HFNC  Atrial flutter with RVR - NSR on tele  - continue amiodarone 200mg  BID  Thrombocytopenia - Resolved.  - On bivalirudin. HIT panel negative.  - can transition to DOAC after TDC  Hypertension  - BP has been soft. Now requiring midodrine for HD  AKI: suspect ATN 2/2 mixed shock. Remains oliguric. - HD held yesterday as above. BUN/Cr up. Needs line and iHD today - 900cc UOP with Lasix 120 mg IV bid  - Nephrology following   Shock liver: - 2/2 mixed shock/hepatic congestion from CHF  - Resolved  Diabetes: poorly  controlled, A1c 10.2   - insulin management per CCM   Deconditioning - appears extremely weak - Aggressive PT/OT  Length of Stay: 25  [place] Lee, NP  05/19/2023, 8:04 AM  Advanced Heart Failure Team Pager [phone] (M-F; 7a - 5p)  Please contact [facility] for night-coverage after hours (5p -7a ) and weekends on [email or web address]  Patient seen with NP, I formulated plan and agree with the above note.   Did not get HD yesterday because of elevated WBCs. BUN 105 today.  No complaints, walked in hall.   UOP 900 cc with IV Lasix yesterday.   General: NAD Neck: JVP 8 cm, no thyromegaly or thyroid nodule.  Lungs: Clear to auscultation bilaterally with normal respiratory effort. CV: Nondisplaced PMI.  Heart regular  S1/S2, no S3/S4, no murmur.  No peripheral edema.    Abdomen: Soft, nontender, no hepatosplenomegaly, no distention.  Skin: Intact without lesions or rashes.  Neurologic: Alert and oriented x 3.  Psych: Normal affect. Extremities: No clubbing or cyanosis.  HEENT: Normal.   Continue po amiodarone 200 mg bid  and bivalirudin for atrial flutter, transition to apixaban when all procedures completed. HIT negative, thrombocytopenia resolved.    Needs HD today. He had hypotension with HD Monday.  Now on midodrine 5 mg tid.  Will be getting new HD catheter.    EF 40-45% with RV dysfunction on last echo, no BP room for GDMT.   Marca Ancona 05/19/2023 9:06 AM

## 2023-05-19 NOTE — Procedures (Signed)
Interventional Radiology Procedure:   Indications: Acute kidney injury  Procedure: Placement of tunneled dialysis catheter  Findings: Right jugular Palindrome, 23 cm tip to cuff.  Tip at SVC/RA junction  Complications: None     EBL: Minimal  Plan: Catheter is ready to use.    Bhavika Schnider R. Lowella Dandy, MD  Pager: [phone]

## 2023-05-19 NOTE — TOC Progression Note (Signed)
Transition of Care [facility]) - Progression Note    Patient Details  Name: Dylan Owen MRN: [id number] Date of Birth: 06-Aug-1965  Transition of [facility]) CM/SW Contact  Elliot Cousin, RN Phone Number: [phone] 05/19/2023, 2:51 PM  Clinical Narrative:    CIR following pt for IP rehab at dc. Pt has Rollator but states he need a bariatric Rollator. Explained he may not meet weight requirement for larger DME. Will continue to follow for dc needs.    Expected Discharge Plan: Home w Home Health Services Barriers to Discharge: Continued Medical Work up  Expected Discharge Plan and Services   Discharge Planning Services: CM Consult Post Acute Care Choice: Home Health Living arrangements for the past 2 months: Single Family Home                           HH Arranged: RN, PT [facility] Agency: [facility] Date [facility] Agency Contacted: 05/16/23 Time HH Agency Contacted: 1555 Representative spoke with at [facility] Agency: Lorenza Chick   Social Determinants of Health (SDOH) Interventions SDOH Screenings   Food Insecurity: No Food Insecurity (05/05/2023)  Recent Concern: Food Insecurity - Food Insecurity Present (05/05/2023)  Housing: Low Risk  (05/05/2023)  Transportation Needs: No Transportation Needs (05/05/2023)  Utilities: Not At Risk (05/05/2023)  Alcohol Screen: Low Risk  (11/05/2020)  Depression (PHQ2-9): High Risk (11/26/2022)  Financial Resource Strain: High Risk (12/05/2022)  Physical Activity: Unknown (12/05/2022)  Social Connections: Moderately Isolated (05/05/2023)  Stress: Stress Concern Present (12/05/2022)  Tobacco Use: High Risk (05/05/2023)    Readmission Risk Interventions     No data to display

## 2023-05-19 NOTE — Inpatient Diabetes Management (Signed)
Inpatient Diabetes Program Recommendations  AACE/ADA: New Consensus Statement on Inpatient Glycemic Control (2015)  Target Ranges:  Prepandial:   less than 140 mg/dL      Peak postprandial:   less than 180 mg/dL (1-2 hours)      Critically ill patients:  140 - 180 mg/dL   Lab Results  Component Value Date   GLUCAP 91 05/19/2023   HGBA1C 10.2 (H) 05/05/2023    Review of Glycemic Control  Latest Reference Range & Units 05/18/23 11:16 05/18/23 16:04 05/18/23 21:29 05/19/23 06:05 05/19/23 07:34 05/19/23 11:31 05/19/23 12:10  Glucose-Capillary 70 - 99 mg/dL 161 (H) 096 (H) 045 (H) 251 (H) 222 (H) 68 (L) 91  (H): Data is abnormally high (L): Data is abnormally low Diabetes history: DM 2 Outpatient Diabetes medications:  Tresiba 80 units q AM and 72 units q PM Novolog 3 units tid with meals Freestyle Libre 3 Current orders for Inpatient glycemic control:  Novolog 0-20 units tid with meals and HS Novolog 4 units tid with meals (meal coverage) Semglee 25 units q HS   Inpatient Diabetes Program Recommendations:    Noted hypoglycemia of 68 mg/dL following Novolog and Semglee dose this AM. With renal status, consider decreasing correction to Novolog 0-6 units TID.  Thanks, Lujean Rave, MSN, RNC-OB Diabetes Coordinator [phone] (8a-5p)

## 2023-05-20 DIAGNOSIS — N179 Acute kidney failure, unspecified: Secondary | ICD-10-CM | POA: Diagnosis not present

## 2023-05-20 LAB — RENAL FUNCTION PANEL
Albumin: 2.4 g/dL — ABNORMAL LOW (ref 3.5–5.0)
Anion gap: 14 (ref 5–15)
BUN: 118 mg/dL — ABNORMAL HIGH (ref 6–20)
CO2: 21 mmol/L — ABNORMAL LOW (ref 22–32)
Calcium: 8.7 mg/dL — ABNORMAL LOW (ref 8.9–10.3)
Chloride: 93 mmol/L — ABNORMAL LOW (ref 98–111)
Creatinine, Ser: 6.96 mg/dL — ABNORMAL HIGH (ref 0.61–1.24)
GFR, Estimated: 9 mL/min — ABNORMAL LOW (ref >60)
Glucose, Bld: 351 mg/dL — ABNORMAL HIGH (ref 70–99)
Phosphorus: 4.6 mg/dL (ref 2.5–4.6)
Potassium: 5.5 mmol/L — ABNORMAL HIGH (ref 3.5–5.1)
Sodium: 128 mmol/L — ABNORMAL LOW (ref 135–145)

## 2023-05-20 LAB — GLUCOSE, CAPILLARY
Glucose-Capillary: 312 mg/dL — ABNORMAL HIGH (ref 70–99)
Glucose-Capillary: 309 mg/dL — ABNORMAL HIGH (ref 70–99)
Glucose-Capillary: 126 mg/dL — ABNORMAL HIGH (ref 70–99)
Glucose-Capillary: 92 mg/dL (ref 70–99)

## 2023-05-20 LAB — CBC
HCT: 37.8 % — ABNORMAL LOW (ref 39.0–52.0)
Hemoglobin: 12.4 g/dL — ABNORMAL LOW (ref 13.0–17.0)
MCH: 26.3 pg (ref 26.0–34.0)
MCHC: 32.8 g/dL (ref 30.0–36.0)
MCV: 80.3 fL (ref 80.0–100.0)
Platelets: 334 10*3/uL (ref 150–400)
RBC: 4.71 MIL/uL (ref 4.22–5.81)
RDW: 17.6 % — ABNORMAL HIGH (ref 11.5–15.5)
WBC: 14.9 10*3/uL — ABNORMAL HIGH (ref 4.0–10.5)
nRBC: 0 % (ref 0.0–0.2)

## 2023-05-20 LAB — MAGNESIUM: Magnesium: 2.3 mg/dL (ref 1.7–2.4)

## 2023-05-20 MED ORDER — DEXTROSE 50 % IV SOLN: 0.0000 mL | INTRAVENOUS | Status: DC | PRN

## 2023-05-20 MED ORDER — INSULIN ASPART 100 UNIT/ML IJ SOLN
6.0000 [IU] | Freq: Three times a day (TID) | INTRAMUSCULAR | Status: DC
  Administered 2023-05-20 – 2023-05-22 (×5): 6 [IU] via SUBCUTANEOUS

## 2023-05-20 MED ORDER — INSULIN GLARGINE-YFGN 100 UNIT/ML ~~LOC~~ SOLN
20.0000 [IU] | Freq: Two times a day (BID) | SUBCUTANEOUS | Status: DC
  Administered 2023-05-20 – 2023-05-22 (×5): 20 [IU] via SUBCUTANEOUS
  Filled 2023-05-20 (×7): qty 0.2

## 2023-05-20 MED ORDER — INSULIN ASPART 100 UNIT/ML IJ SOLN
0.0000 [IU] | Freq: Three times a day (TID) | INTRAMUSCULAR | Status: DC
  Administered 2023-05-20: 15 [IU] via SUBCUTANEOUS
  Administered 2023-05-20: 3 [IU] via SUBCUTANEOUS
  Administered 2023-05-21: 11 [IU] via SUBCUTANEOUS
  Administered 2023-05-21 (×2): 4 [IU] via SUBCUTANEOUS
  Administered 2023-05-22: 11 [IU] via SUBCUTANEOUS

## 2023-05-20 MED ORDER — INSULIN REGULAR(HUMAN) IN NACL 100-0.9 UT/100ML-% IV SOLN: INTRAVENOUS | Status: DC

## 2023-05-20 NOTE — Procedures (Addendum)
Received patient in bed to unit.  Alert and oriented.  Informed consent signed and in chart.   TX duration: 3.5 hours  Patient tolerated well.  Transported back to the room  Alert, without acute distress.  Hand-off given to patient's nurse.   Access used: right ij Access issues: none  Total UF removed: 1 liter Medication(s) given: none   Lu Duffel, RN Kidney Dialysis Unit

## 2023-05-20 NOTE — Progress Notes (Addendum)
NAME:  Dylan Owen, MRN:  [id number], DOB:  [birth year], LOS: 15 ADMISSION DATE:  05/04/2023, CONSULTATION DATE:  05/07/2023 REFERRING MD:  Elvera Lennox - TRH, CHIEF COMPLAINT:  Resp failure shock    History of Present Illness:  58 year old male with PMHx significant for obesity, hypertension, hyperlipidemia, type 2 diabetes, sleep apnea, chronic back pain, depression, acid reflux.  Also depression significant home medications include Lasix, hydrochlorothiazide.  Lisinopril, metoprolol, nitroglycerin but also Robaxin and MS Contin 30 mg twice daily along with Repatha.  And Effexor.  Along with insulin for diabetes.  She he walks around with a cane at baseline.  He also admits to medication noncompliance.  And 10 pound weight gain he is not on oxygen at home.  His hemoglobin A1c was 10.2 at admission consistent with very poor compliance.   Admitted on 05/04/2023 with complaints of cough, sore throat, dizziness and bodyaches for 1 week.  Also included chest tightness headaches chills diaphoresis.  At the time of arrival to the ED also had dyspnea on exertion.  At admission BNP was 1275.  Respiratory virus panel was negative.  He was started on Lasix 60 twice daily and his home lisinopril.  He was initially hypertensive and with this management blood pressure improved.  Echocardiogram showed low ejection fraction approximately 25% although poor acoustic windows.  Chest x-ray suggestive of venous congestion.  Cardiology consultation 05/06/2023 noted net -1.6 L since admission and patient was given history of increased frequency of urination.  Diagnose of viral myocarditis was made and left a right heart catheterization was considered.  Troponin barely elevated and consistent with viral myocarditis.   Postadmission with diuresis as of 05/06/2023 creatinine rose up to 1.57 mg percent [baseline 1 mg percent].  At this point lisinopril was held.  811/31/25 developed hypoglycemia requiring infusion of dextrose and  transferred to the intensive care unit.  Post transfer to the intensive care unit he developed acute fever and also BiPAP dependency.  Along with worsening renal failure further to creatinine of 2.83 mg percent along with persistent lactic acidosis around 3.  And mild intermittent delirium.  Overnight diaphoresis reported but despite all this nursing consistently reports that his periphery is extremely cold.   Cardiology has been reconsulted.  Critical care medicine consulted.  Pertinent Medical History:  B12 deficiency, Blood transfusion without reported diagnosis, Chronic low back pain, Depression, Diabetes mellitus with complication (HCC), GERD (gastroesophageal reflux disease), Heart murmur, History of adenomatous polyp of colon (05/22/2021), Hypercholesterolemia, Hypertension, OSA (obstructive sleep apnea), Peripheral neuropathy, Persistent asthma, Seizures (HCC), Stable angina (HCC), Statin intolerance, and Subclinical hypothyroidism (06/2017).    reports that he has never smoked. His smokeless tobacco use includes chew.  Significant Hospital Events: Including procedures, antibiotic start and stop dates in addition to other pertinent events   1/29 Admit to [facility], diuresis 1/30 ECHO w LVEF 25% 1/31 Cards consult, c/f viral myocarditis  2/1 PCCM consult txf to [facility], Adv HF consult  2/2 For HD cath placement and CRRT. Incr D10 for persistent hypogly   2/3 Milrinone stopped. Weaning NE; intubated due to being on bipap for a few days and needing to go to CT  2/5 Extubated. Cleviprex for hypertension   2/6 CT Head for anisocoria, no acute findings. Off clevi. Adding PRN metop. CRRT UF decr to 150/hr.  2/7 On BiPAP. RHC and DCCV  2/8 Off crrt  2/9 Albumin + lasix challenge. HIT neg   2/10: got iHD 2/12: HD cath pulled by pt overnight. Ordered  IR for [facility]. Holding bivalirudin  2/13: did not get TDC yesterday because WBC 14. Reorder for [facility] today. If they are not able to work him in, will have to  place temp cath to start iHD.  2/14: TDC yesterday, iHD this AM. Likely out of ICU after dialysis   Interim History / Subjective:  NAEON. Dialysis this morning. He has no complaints.   Objective:  Blood pressure 121/64, pulse (!) 105, temperature 97.8 F (36.6 C), resp. rate (!) 21, height 6\' 1"  (1.854 m), weight 131.3 kg, SpO2 94%.    FiO2 (%):  [40 %] 40 % PEEP:  [5 cmH20] 5 cmH20 Pressure Support:  [5 cmH20] 5 cmH20   Intake/Output Summary (Last 24 hours) at 05/20/2023 1038 Last data filed at 05/20/2023 0800 Gross per 24 hour  Intake 289.34 ml  Output 3295 ml  Net -3005.66 ml   Filed Weights   05/19/23 0500 05/20/23 0500 05/20/23 0727  Weight: 133.4 kg 131.3 kg 131.3 kg   Physical Examination: General: acute on chronically ill appearing male laying in bed, no acute distress HEENT: anicteric sclera, PERRLA, mmm, abrasion on bridge of nose, room air Neuro: awake, alert, oriented x4. Non focal exam CV: sinus rhythm , no m/r/g PULM: room air, resp even and unlabored, diminshed bases  GI: rounded, soft, ntnd, +BS Extremities: trace to 1+ edema lower extremities Skin: warm/dry. Abrasion to bridge of nose. Chronic changes to skin in BLE  Resolved Hospital Problem List:   Hypomagnesemia Shock Acute metabolic encephalopathy  Thrombocytopenia  Assessment & Plan:  Sepsis 2/2 UTI, multifocal MSSA PNA , shock resolved  Initial concern for cardiogenic shock w HFrEF, possible viral myocarditis but coox et al have been assuring against. Course has been most c/w septic shock, compounded by septic cardiomyopathy + pHTN, sequelae of multisystem organ dysfxn. Cx have not identified an org, however clinically he continued to decline until we broadened to [facility], and turned around pretty rapidly thereafter.  - completed 7 day course of mero with improvement  - culture date with MSSA, broadly sensitive  - trend fever, wbc curve (WBC up to 14 on 2/12 and flat 2/13) - repeat procal 0.9, no  fevers, wbc plateau   Acute hypoxic and hypercarbic resp failure, resolving MSSA PNA Hx OSA non on CPAP  - Supplemental O2 support for SpO2 > 90%, now on room air - resp status would benefit from volume removal with dialysis  - BiPAP PRN + at bedtime - Pulmonary hygiene, encourage OOB/mobility - Outpatient sleep evaluation recommended  New Afib/flutter s/p DCCV 2/7 -- now sinus  Acute (possibly on chronic) HFrEF-- septic cardiomyopathy  pHTN (groud II and III) Initial c/f myocarditis but less favored as course/workup went on. Repeat Echo EF 40-45%. RHC with normal R and L filling pressures, elevated PA mean and PVR. - Amiodarone transitioned to PO - start Eliquis  - Cardiac monitoring - Optimize electrolytes for K > 4, Mg > 2 - Encourage NIV compliance - Volume optimization with iHD as tolerated  AKI likely ATN AGMA, resolved  Tolerated iHD 2/10 for clearance, unable to pull fluid due to soft BP; Nephro planning to make adjustments to see if next session is better tolerated - Nephro following, appreciate assistance - iHD today, if tolerates -> out of ICU - Trend BMP - Replete electrolytes as indicated - Monitor I&Os - Avoid nephrotoxic agents as able - Ensure adequate renal perfusion  DM2 - cbg QID before meals and at bedtime  - semglee 20  U BID - novolog 4U @ meals  - SSI changed to resistant coverage for meals  - con't HS SSI coverage  Code status discussion / GOC  Spoke w wife 2/3 who verified Full code/ full scope of practice - Remains full code  Best Practice (right click and "Reselect all SmartList Selections" daily)   Diet/type: Regular consistency (see orders) DVT prophylaxis: DOAC Pressure ulcer(s): pressure ulcer assessment deferred  GI prophylaxis: H2B Lines:  Central line and yes and it is still needed - PICC  Foley:  Yes, and it is still needed  Code Status:  full code Last date of multidisciplinary goals of care discussion [Wife updated via phone  2/11]  Critical care time: 32   Cristopher Peru, PA-C [facility] 05/20/23 10:38 AM  Please see [email or web address] for pager details.  From 7A-7P if no response, please call [phone] After hours, please call [place] Corn [phone]     Critical care attending attestation note: I agree with the Advanced Practitioner's note, impression, and recommendations as outlined. I have taken an independent interval history, reviewed the chart and examined the patient. The following reflects my medical decision making and independent critical care time   Synopsis of assessment and plan: 58 year old male 2 weeks here with AKI requiring CRRT and septic shock due to urinary tract infection and MSSA pneumonia  Patient is tolerating intermittent HD.  Tunneled catheter was placed yesterday No overnight events    Physical exam: General: Chronically ill-appearing male, lying on the bed HEENT: [place]/AT, eyes anicteric.  moist mucus membranes Neuro: Alert, awake following commands Chest: Coarse breath sounds, no wheezes or rhonchi Heart: Regular rate and rhythm, no murmurs or gallops Abdomen: Soft, nontender, nondistended, bowel sounds present Skin: No rash   Labs and images were reviewed  Assessment and plan: Sepsis with septic shock due to UTI, POA Multifocal MSSA pneumonia Acute respiratory failure with hypoxia and hypercapnia New onset A-fib status post cardioversion, now in sinus rhythm Acute HFrEF Pulmonary hypertension AKI due to septic ATN High anion gap metabolic acidosis Diabetes type 2 Obesity  Completed antibiotic therapy Continue titrate nasal cannula oxygen Remain in sinus rhythm Continue Eliquis Continue p.o. amiodarone Monitor electrolytes Monitor intake and output Tolerated IHD Nephrology is following Monitor fingerstick goal 140-180      Cheri Fowler, MD  Pulmonary Critical Care See Amion for pager If no response to pager, please call  [phone] until 7pm After 7pm, Please call E-link [phone]   05/20/2023, 1:38 PM

## 2023-05-20 NOTE — Progress Notes (Addendum)
Patient took nasal cannula out of nose and refuses to put it back on. Educated patient on the importance of wearing oxygen, patient continued to refuse. Pulse ox remains around 95% at this time. Unit nurse notified.

## 2023-05-20 NOTE — Progress Notes (Signed)
Physical Therapy Treatment Patient Details Name: Dylan Owen MRN: [id number] DOB: 12/02/1965 Today's Date: 05/20/2023   History of Present Illness Pt is 58 yo presenting to [facility] as transfer from [facility] 2/1 due to mixed septic and cardiogenic shock. Pt presented to [facility] hospital due to referral from MD on 05/05/23. Pt started on CRRT on 2/2-2/8, transitioned to HD. Intubated on 2/3 due to constant BiPAP use and unable to wean. Intubation was discontinued on 2/5. On BIPAP on 2/7 then off BiPAP. CRRT discontinued on 2/8.  Had intermittent HD at bedside this am.  PMH: HTN, hyperlipidemia, DM II, sleep apnea, chronic back pain.    PT Comments  Pt admitted with above diagnosis. Pt was able to take pivotal steps to chair with min assist of 2. Pt very fatigued s/p HD therefore limited treatment today. Will continue to follow acutely.  Pt currently with functional limitations due to the deficits listed below (see PT Problem List). Pt will benefit from acute skilled PT to increase their independence and safety with mobility to allow discharge.       If plan is discharge home, recommend the following: A lot of help with walking and/or transfers;Assistance with cooking/housework;Assist for transportation   Can travel by private vehicle        Equipment Recommendations  BSC/3in1;Rollator (4 wheels)    Recommendations for Other Services Rehab consult     Precautions / Restrictions Precautions Precautions: Fall Precaution/Restrictions Comments: watch SpO2/HR Restrictions Weight Bearing Restrictions Per Provider Order: No     Mobility  Bed Mobility Overal bed mobility: Needs Assistance Bed Mobility: Supine to Sit Rolling: Mod assist, Used rails   Supine to sit: Mod assist, Used rails     General bed mobility comments: Pt needed assist for trunk and LEs    Transfers Overall transfer level: Needs assistance Equipment used:  Carley Hammed walker) Transfers: Sit to/from Stand, Bed to  chair/wheelchair/BSC Sit to Stand: +2 safety/equipment, Mod assist Stand pivot transfers: Min assist, +2 physical assistance         General transfer comment: Cues for hand placement, increased time, use of momentum.  Pt having a lot of difficulty with sequencing sit to stand and wanted to pull up on Eva walker.  Some confusion today as well. Did have HD earlier therefore only able to pivot a few steps to chair today.    Ambulation/Gait                   Stairs             Wheelchair Mobility     Tilt Bed    Modified Rankin (Stroke Patients Only)       Balance Overall balance assessment: Needs assistance Sitting-balance support: Feet supported, No upper extremity supported Sitting balance-Leahy Scale: Good     Standing balance support: Bilateral upper extremity supported, Reliant on assistive device for balance Standing balance-Leahy Scale: Poor Standing balance comment: heavily dependent on UEs on eva walker, no overt LOB                            Communication Communication Communication: No apparent difficulties  Cognition Arousal: Alert Behavior During Therapy: WFL for tasks assessed/performed   PT - Cognitive impairments: Memory, Safety/Judgement                         Following commands: Intact      Cueing Cueing Techniques:  Verbal cues, Tactile cues  Exercises General Exercises - Lower Extremity Long Arc Quad: AROM, Both, 10 reps, Seated Hip Flexion/Marching: AROM, Both, 10 reps, Seated    General Comments General comments (skin integrity, edema, etc.): VSS with pt needing 5LO2 throughout to keep sats >90%.  Pt kept removing O2 and needed reinforcement to keep the O2 in place.      Pertinent Vitals/Pain Pain Assessment Pain Assessment: No/denies pain    Home Living                          Prior Function            PT Goals (current goals can now be found in the care plan section) Progress  towards PT goals: Progressing toward goals    Frequency    Min 1X/week      PT Plan      Co-evaluation              AM-PAC PT "6 Clicks" Mobility   Outcome Measure  Help needed turning from your back to your side while in a flat bed without using bedrails?: A Little Help needed moving from lying on your back to sitting on the side of a flat bed without using bedrails?: A Lot Help needed moving to and from a bed to a chair (including a wheelchair)?: Total Help needed standing up from a chair using your arms (e.g., wheelchair or bedside chair)?: Total Help needed to walk in hospital room?: Total Help needed climbing 3-5 steps with a railing? : Total 6 Click Score: 9    End of Session Equipment Utilized During Treatment: Gait belt;Oxygen Activity Tolerance: Patient limited by fatigue Patient left: with call bell/phone within reach;in chair Nurse Communication: Mobility status PT Visit Diagnosis: Unsteadiness on feet (R26.81);Other abnormalities of gait and mobility (R26.89);Muscle weakness (generalized) (M62.81)     Time: 1200-1227 PT Time Calculation (min) (ACUTE ONLY): 27 min  Charges:    $Therapeutic Exercise: 8-22 mins $Therapeutic Activity: 8-22 mins PT General Charges $$ ACUTE PT VISIT: 1 Visit                     Dylan Owen,PT Acute Rehab Services [phone]    Dylan Owen 05/20/2023, 3:45 PM

## 2023-05-20 NOTE — Progress Notes (Signed)
Patient ID: Dylan Owen, male   DOB: [birth year], 58 y.o.   MRN: [id number] [facility] Progress Note   Assessment/ Plan:   1. Acute kidney Injury: With essentially normal renal function at baseline (creatinine 1.1) and suffered ATN in the setting of shock.  CRRT early on.  He is now non oliguric but BUN and crt cont to rise slowly.   dialysis today to manage azotemia.   With inc UOP-  will do HD now PRN, likely watch over the weekend for signs of recovery - s/p [facility] 2/13  - Lasix 120 IV BID but now with great UOP ( post injury diuresis)  will hold lasix - midodrine on board now-  will continue  2.  Mixed cardiogenic/septic shock: Clinically suspected to be associated with viral myocarditis/acute CHF exacerbation.  CRRT discontinued over the weekend then 3-4 L net positive; urine output appears to be picking up- utilized lasix but now will hold.  Will undertake dialysis today with goal of ultrafiltration 2.5 L as tolerated.  Now less because made so much urine and on room air  3.  Shock liver: Transaminases appear to be trending down with hemodynamic support.  Hemodynamically improving. 4.  Acute hypoxic/hypercarbic respiratory failure: Tolerating supplemental oxygen via nasal cannula after earlier requiring intubation/ventilator support and intermittent NIPPV.-  now on room air 5. Anemia-  not an issue   Subjective:    Cr still continues to trend up but had great urine last 24 hours....3300-  did not get TDC until last night-  much appreciate IR-   will get HD today -  very talkative    Objective:   BP 139/69   Pulse (!) 109   Temp 97.8 F (36.6 C)   Resp 20   Ht 6\' 1"  (1.854 m)   Wt 131.3 kg   SpO2 97%   BMI 38.19 kg/m   Intake/Output Summary (Last 24 hours) at 05/20/2023 0730 Last data filed at 05/20/2023 0430 Gross per 24 hour  Intake 154.99 ml  Output 3325 ml  Net -3170.01 ml   Weight change: -2.1 kg  Physical Exam: Gen: Awake and alert, watching  television-  very talkative -  new TDC  CVS: Pulse regular rhythm, normal rate, S1 and S2 normal Resp: Anteriorly clear to auscultation, no rales/rhonchi Abd: Soft, obese, nontender, bowel sounds normal Ext: trace bilateral lower extremity edema with skin wrinkling, trace-1+ upper extremity edema with some dependent edema  Imaging: DG CHEST PORT 1 VIEW Result Date: 05/19/2023 CLINICAL DATA:  Cough and shortness of breath. EXAM: PORTABLE CHEST 1 VIEW COMPARISON:  05/13/2023 FINDINGS: Previous left-sided dialysis catheter is been removed. There is a new right-sided dialysis catheter with tip at the atrial caval junction. Right upper extremity PICC tip is again seen. No pneumothorax. Mild cardiomegaly is similar. Ill-defined patchy bibasilar opacities, similar to prior exam. No pleural fluid. IMPRESSION: 1. Similar patchy bibasilar opacities. 2. Previous left-sided dialysis catheter is been removed. There is a new right-sided dialysis catheter with tip at the atrial caval junction. No pneumothorax. Electronically Signed   By: Narda Rutherford M.D.   On: 05/19/2023 18:20     Labs: BMET Recent Labs  Lab 05/14/23 1627 05/15/23 0421 05/16/23 0335 05/17/23 0428 05/18/23 0540 05/19/23 0448 05/20/23 0432  NA 128* 132* 131* 134* 130* 132* 128*  K 4.7 4.3 4.6 4.2 4.4 4.8 5.5*  CL 92* 97* 91* 98 95* 95* 93*  CO2 18* 19* 20* 23 22 22  21*  GLUCOSE 405*  179* 271* 202* 313* 241* 351*  BUN 62* 72* 95* 54* 84* 105* 118*  CREATININE 4.68* 5.47* 6.86* 4.66* 6.31* 6.74* 6.96*  CALCIUM 7.5* 8.0* 8.8* 8.1* 8.1* 8.5* 8.7*  PHOS 3.4 4.1 3.1 3.7 4.2 4.3 4.6   CBC Recent Labs  Lab 05/17/23 0428 05/18/23 0540 05/19/23 0448 05/20/23 0432  WBC 10.8* 14.7* 14.8* 14.9*  HGB 12.1* 11.6* 11.3* 12.4*  HCT 36.3* 35.5* 34.5* 37.8*  MCV 78.9* 79.4* 79.9* 80.3  PLT 174 205 263 334    Medications:     amiodarone  200 mg Oral BID   apixaban  5 mg Oral BID   aspirin EC  81 mg Oral Daily   bacitracin    Topical BID   Chlorhexidine Gluconate Cloth  6 each Topical Daily   famotidine  20 mg Oral Daily   feeding supplement  237 mL Oral BID BM   insulin aspart  0-20 Units Subcutaneous TID WC   insulin aspart  6 Units Subcutaneous TID WC   insulin glargine-yfgn  20 Units Subcutaneous BID   lidocaine  1 patch Transdermal Q24H   methocarbamol  500 mg Oral BID   midodrine  5 mg Oral TID WC   multivitamin  1 tablet Oral QHS   nicotine  14 mg Transdermal Daily   oxyCODONE  10 mg Oral Q6H    Yerick Eggebrecht A Chandler Stofer  05/20/2023, 7:30 AM

## 2023-05-20 NOTE — Progress Notes (Addendum)
Advanced Heart Failure Rounding Note  HF Cardiologist: Dr. Elwyn Lade  Chief Complaint: AKI  Patient Profile   58 y.o. male with a PMH of obesity, HTN, HLD, DM2, and OSA admitted w/ mixed septic and cardiogenic shock>>progression to anuric renal failure requiring CRRT.  Interval Hx:    2/3: worsening septic shock>> fever up to 101, increase LA, escalation of pressors, delirium and worsening respiratory failure>>intubated. Milrinone stopped due to frequent ectopy. Abx broadened. Vanc added to meropenum. CT of C/A/P w/ multifocal PNA and s/o cystitis. 2/4: Marked improvement. Resolution of fever and delirium. Weaned off pressors, became hypertensive and started on cleviprex gtt  2/5: Extubated, weaned off Cleviprex, Cortrak placed (post pyloric) 2/7: RHC, DCCV AFL to NSR. 2/13: Tunneled PICC  Given lasix challenge.   Subjective:   Denies SOB.  Objective:    Weight Range: 131.3 kg Body mass index is 38.19 kg/m.   Vital Signs:   Temp:  [97.6 F (36.4 C)-98.3 F (36.8 C)] 97.8 F (36.6 C) (02/14 0727) Pulse Rate:  [87-109] 109 (02/14 0727) Resp:  [13-28] 20 (02/14 0727) BP: (90-145)/(41-98) 139/69 (02/14 0727) SpO2:  [91 %-99 %] 97 % (02/14 0727) FiO2 (%):  [40 %] 40 % (02/13 2232) Weight:  [131.3 kg] 131.3 kg (02/14 0727) Last BM Date : 05/13/23  Weight change: Filed Weights   05/19/23 0500 05/20/23 0500 05/20/23 0727  Weight: 133.4 kg 131.3 kg 131.3 kg   Intake/Output:   Intake/Output Summary (Last 24 hours) at 05/20/2023 0745 Last data filed at 05/20/2023 0430 Gross per 24 hour  Intake 154.99 ml  Output 3325 ml  Net -3170.01 ml    Physical Exam   General:  No resp difficulty.  Neck: supple. JVP difficult to assess.  Cor: PMI nondisplaced. Regular rate & rhythm. No rubs, gallops or murmurs. Lungs: clear on 5 liter [place].  Extremities: no cyanosis, clubbing, rash, edema Neuro: alert & orientedx3, moves all 4 extremities w/o difficulty. Affect  pleasant  Telemetry: SR 90-100s  Labs    CBC Recent Labs    05/19/23 0448 05/20/23 0432  WBC 14.8* 14.9*  HGB 11.3* 12.4*  HCT 34.5* 37.8*  MCV 79.9* 80.3  PLT 263 334   Basic Metabolic Panel Recent Labs    16/10/96 0448 05/20/23 0432  NA 132* 128*  K 4.8 5.5*  CL 95* 93*  CO2 22 21*  GLUCOSE 241* 351*  BUN 105* 118*  CREATININE 6.74* 6.96*  CALCIUM 8.5* 8.7*  MG 2.3 2.3  PHOS 4.3 4.6   Liver Function Tests Recent Labs    05/19/23 0448 05/20/23 0432  ALBUMIN 2.1* 2.4*   No results for input(s): "LIPASE", "AMYLASE" in the last 72 hours.  Cardiac Enzymes No results for input(s): "CKTOTAL", "CKMB", "CKMBINDEX", "TROPONINI" in the last 72 hours.  BNP: BNP (last 3 results) Recent Labs    05/04/23 1645  BNP 1,275.2*   Medications:    Scheduled Medications:  amiodarone  200 mg Oral BID   apixaban  5 mg Oral BID   aspirin EC  81 mg Oral Daily   bacitracin   Topical BID   Chlorhexidine Gluconate Cloth  6 each Topical Daily   famotidine  20 mg Oral Daily   feeding supplement  237 mL Oral BID BM   insulin aspart  0-20 Units Subcutaneous TID WC   insulin aspart  6 Units Subcutaneous TID WC   insulin glargine-yfgn  20 Units Subcutaneous BID   lidocaine  1 patch Transdermal Q24H  methocarbamol  500 mg Oral BID   midodrine  5 mg Oral TID WC   multivitamin  1 tablet Oral QHS   nicotine  14 mg Transdermal Daily   oxyCODONE  10 mg Oral Q6H    Infusions:  anticoagulant sodium citrate      PRN Medications: acetaminophen (TYLENOL) oral liquid 160 mg/5 mL, albuterol, alteplase, anticoagulant sodium citrate, heparin, HYDROmorphone (DILAUDID) injection, naLOXone (NARCAN)  injection, nitroGLYCERIN, ondansetron (ZOFRAN) IV, mouth rinse  Assessment/Plan   Mixed cardiogenic/septic shock: Presented febrile, cold on exam, with worsening blood pressure. TEE w/ moderately reduced LVEF. Initially on Milrinone. 2/3 developed worsening septic shock w/ fever, increase in  lactic acid, delirium, and worsening respiratory failure>>intubated. Milrinone stopped due to frequent ectopy. CT of C/A/P w/ multifocal PNA and s/o cystitis. UA dirty, Ucx multiple species, Respiratory Cx rare MSSA, Bcx NG. - RHC 2/7: CI 2 to 2.3 L/min/m in AF. - Completed 7 day course Meropenem. - Resolved  Acute systolic CHF - Suspect stress CM in setting of sepsis - Echo 1/30: Unable to estimate EF d/t image quality, RWMA - Limited echo 02/06: EF 40-45%, RWMA, RV reduced - RHC 2/7 CI of 2-2.3 in AF. I suspect his LVEF will improve now that he is out of septic - Volume status trending up. Plan iHD today.   - GDMT limited by hypotension and renal function - on midodrine 5 mg tid for HD  Acute hypoxic and hypercarbic respiratory failure Hx OSA - Secondary to PNA and acute heart failure. Intubated 2/3 - extubated 2/5 - continue nightly BiPAP/[place] during day. Currently on 5L HFNC, Sats stable.   Atrial flutter with RVR - Maintaining SR.  - continue amiodarone 200mg  BID  Thrombocytopenia - Resolved.  - On bivalirudin. HIT panel negative.  - TDC placed. Now on eliquis 5 mg twice a day.   Hypertension  - BP has been soft. Now requiring midodrine for HD  AKI: suspect ATN 2/2 mixed shock. Remains oliguric. Made > 3 liters of urine.  - Plan for iHD  - Nephrology following   Shock liver: - 2/2 mixed shock/hepatic congestion from CHF  - Resolved  Diabetes: poorly controlled, A1c 10.2   - insulin management per CCM   Deconditioning - appears extremely weak - Aggressive PT/OT  Length of Stay: 15  Amy Clegg, NP  05/20/2023, 7:45 AM  Advanced Heart Failure Team Pager [phone] (M-F; 7a - 5p)  Please contact [facility] for night-coverage after hours (5p -7a ) and weekends on [email or web address]  Patient seen with NP, I formulated the plan and agree with the above note.   BUN up to 118 today but excellent UOP, 3325 cc over last 24 hrs.  BP stable.   General: NAD Neck: No JVD,  no thyromegaly or thyroid nodule.  Lungs: Clear to auscultation bilaterally with normal respiratory effort. CV: Nondisplaced PMI.  Heart regular S1/S2, no S3/S4, no murmur.  Trace ankle edema.   Abdomen: Soft, nontender, no hepatosplenomegaly, no distention.  Skin: Intact without lesions or rashes.  Neurologic: Alert and oriented x 3.  Psych: Normal affect. Extremities: No clubbing or cyanosis.  HEENT: Normal.   Plan for HD today for azotemia, then watch over weekend and HD as needed.  Having post-ATN diuresis, he may have some functional recovery.   Continue current midodrine for now, maintain MAP while trying to see if renal function will recover. GDMT otherwise limited by AKI and BP.   Marca Ancona 05/20/2023 10:40 AM

## 2023-05-20 NOTE — Procedures (Signed)
Dr. Kathrene Bongo at bedside, new orders received to just pull 1 liter of fluid off. Clarified albumin parameter orders-albumin can be given if systolic is less than 110.

## 2023-05-20 NOTE — Plan of Care (Signed)

## 2023-05-20 NOTE — Plan of Care (Signed)
  Problem: Coping: Goal: Ability to adjust to condition or change in health will improve 05/20/2023 1825 by Ermalene Searing, RN Outcome: Progressing

## 2023-05-20 NOTE — Progress Notes (Signed)
Inpatient Rehab Admissions Coordinator:    CIR following. Not yet stable for CIR. I will send case to insurance and pursue for admit once medically ready.   Megan Salon, MS, CCC-SLP Rehab Admissions Coordinator  [phone] (celll) [phone] (office)

## 2023-05-21 DIAGNOSIS — R7989 Other specified abnormal findings of blood chemistry: Secondary | ICD-10-CM | POA: Diagnosis not present

## 2023-05-21 DIAGNOSIS — I509 Heart failure, unspecified: Secondary | ICD-10-CM | POA: Diagnosis not present

## 2023-05-21 DIAGNOSIS — A419 Sepsis, unspecified organism: Secondary | ICD-10-CM | POA: Diagnosis not present

## 2023-05-21 DIAGNOSIS — G4733 Obstructive sleep apnea (adult) (pediatric): Secondary | ICD-10-CM | POA: Diagnosis not present

## 2023-05-21 LAB — RENAL FUNCTION PANEL
Albumin: 2.2 g/dL — ABNORMAL LOW (ref 3.5–5.0)
Anion gap: 13 (ref 5–15)
BUN: 75 mg/dL — ABNORMAL HIGH (ref 6–20)
CO2: 25 mmol/L (ref 22–32)
Calcium: 8.5 mg/dL — ABNORMAL LOW (ref 8.9–10.3)
Chloride: 92 mmol/L — ABNORMAL LOW (ref 98–111)
Creatinine, Ser: 4.99 mg/dL — ABNORMAL HIGH (ref 0.61–1.24)
GFR, Estimated: 13 mL/min — ABNORMAL LOW (ref >60)
Glucose, Bld: 146 mg/dL — ABNORMAL HIGH (ref 70–99)
Phosphorus: 4.5 mg/dL (ref 2.5–4.6)
Potassium: 5.1 mmol/L (ref 3.5–5.1)
Sodium: 130 mmol/L — ABNORMAL LOW (ref 135–145)

## 2023-05-21 LAB — CBC
HCT: 34.6 % — ABNORMAL LOW (ref 39.0–52.0)
Hemoglobin: 11 g/dL — ABNORMAL LOW (ref 13.0–17.0)
MCH: 26.1 pg (ref 26.0–34.0)
MCHC: 31.8 g/dL (ref 30.0–36.0)
MCV: 82.2 fL (ref 80.0–100.0)
Platelets: 308 10*3/uL (ref 150–400)
RBC: 4.21 MIL/uL — ABNORMAL LOW (ref 4.22–5.81)
RDW: 17.3 % — ABNORMAL HIGH (ref 11.5–15.5)
WBC: 12.1 10*3/uL — ABNORMAL HIGH (ref 4.0–10.5)
nRBC: 0 % (ref 0.0–0.2)

## 2023-05-21 LAB — MAGNESIUM: Magnesium: 2 mg/dL (ref 1.7–2.4)

## 2023-05-21 LAB — GLUCOSE, CAPILLARY
Glucose-Capillary: 265 mg/dL — ABNORMAL HIGH (ref 70–99)
Glucose-Capillary: 163 mg/dL — ABNORMAL HIGH (ref 70–99)
Glucose-Capillary: 167 mg/dL — ABNORMAL HIGH (ref 70–99)
Glucose-Capillary: 179 mg/dL — ABNORMAL HIGH (ref 70–99)

## 2023-05-21 MED ORDER — SALINE SPRAY 0.65 % NA SOLN
1.0000 | NASAL | Status: DC | PRN
  Administered 2023-05-22: 1 via NASAL
  Filled 2023-05-21: qty 44

## 2023-05-21 NOTE — Progress Notes (Signed)
 Patient had 40 beat run of VTACH.  Patient asymptomatic.  VS stable.  Critical Care made aware.  Asked for labs to be drawn ASAP.  Will contact Lab.  Will continue to monitor patient.  Bernie Covey RN

## 2023-05-21 NOTE — Progress Notes (Signed)
 Per MD Rai, hold Eliquis.

## 2023-05-21 NOTE — Progress Notes (Addendum)
 [facility]                                                                              Dylan Owen, is a 58 y.o. male, DOB - October 13, 1965, XWR:[id number] Admit date - 05/04/2023    Outpatient Primary MD for the patient is McGowen, Maryjean Morn, MD  LOS - 16  days  Chief Complaint  Patient presents with   Shortness of Breath       Brief summary   Patient is a 58 year old male with obesity, HTN, HLP, diabetes mellitus type 2, OSA, chronic back pain, depression, GERD was admitted on 05/04/2023 with cough, sore throat, dizziness, myalgias for a week.  Patient had also reported chest tightness, headaches, chills, diaphoresis with dyspnea on exertion.  BNP was 1275.  Respiratory virus panel negative.  Patient was placed on Lasix.  Troponins mildly elevated, diagnosis of viral myocarditis was made. 2D echo showed EF ~25% although poor acoustic windows, cardiology was consulted. As of 05/06/2023, creatinine started trending up, was transferred to intensive care unit.  He was placed on BiPAP, milrinone.  Patient was intubated on 2/3 and was extubated on 2/5.  Also started on CRRT, currently on intermittent hemodialysis. Patient was transferred out to the floor and [facility] assumed care on 05/21/2023  Significant Hospital Events:   1/29 Admit to [facility], diuresis 1/30 ECHO w LVEF 25% 1/31 Cards consult, c/f viral myocarditis  2/1 PCCM consult txf to [facility], Adv HF consult  2/2 For HD cath placement and CRRT. Incr D10 for persistent hypogly   2/3 Milrinone stopped. Weaning NE; intubated due to being on bipap for a few days and needing to go to CT  2/5 Extubated. Cleviprex for hypertension   2/6 CT Head for anisocoria, no acute findings. Off clevi. Adding PRN metop. CRRT UF decr to 150/hr.  2/7 On BiPAP. RHC and DCCV  2/8 Off crrt  2/9 Albumin + lasix challenge. HIT neg   2/10: got iHD 2/12: HD cath pulled by pt overnight. Ordered IR for TDC. Holding bivalirudin  2/13: did not get TDC  yesterday because WBC 14.  TDC on 2/13.   2/14: TDC on 2/13, underwent iHD, out of ICU after dialysis.    Assessment & Plan    Sepsis secondary to ? UTI, multifocal MSSA PNA, POA septic shock-resolved -Presented febrile, TEE showed moderately reduced LVEF.  Initial concern for cardiogenic shock with HFrEF, possible viral myocarditis, sequelae of multisystem organ dysfunction.  Per PCCM, likely septic shock, was placed on broad-spectrum antibiotics with meropenem and improved significantly. -Patient had required vasopressors in ICU, currently off -Respiratory virus panel negative, blood cultures 1/31 negative, urine culture showed multiple species.  -Tracheal aspirate showed rare Candida and Staph aureus/MSSA -Patient has completed 7 days of IV meropenem on 05/14/2023 -Sepsis physiology resolved     Acute hypoxic and hypercarbic resp failure, resolving MSSA PNA -Secondary to PNA, acute systolic heart failure, was intubated on 2/3, extubated on 2/5 - Supplemental O2 support for SpO2 > 90%, now on room air - BiPAP PRN + at bedtime - Pulmonary hygiene, encourage OOB/mobility -O2 sats 95 to 100% on 6 L  O2 via [place], wean as tolerated -Completed IV antibiotics  History of obstructive sleep apnea -Continue BiPAP at bedtime and O2 via [place] during the day -Outpatient sleep study recommended   New Afib/flutter s/p DCCV 2/7 -- now sinus rhythm Acute systolic HFrEF-- septic cardiomyopathy  pHTN (groud II and III) -Initial concern for viral myocarditis -Initial echo 1/30: Unable to estimate EF due to poor quality - Repeat Echo 2/6 showed EF 40-45%.  - RHC with normal R and L filling pressures, elevated PA mean and PVR. - Initially placed on IV amiodarone drip, transitioned to p.o. amiodarone  -Continue apixaban -Volume management with intermittent HD -Currently on midodrine Addendum: 4:40 pm  Notified by RN that patient had epistaxis x 2 today and appears to have hematuria, will hold eliquis.     Acute kidney injury likely ATN AGMA, resolved  -Nephrology following, essentially normal renal function at baseline, 1.1, worsened due to ATN in the setting of shock. -Placed on CRRT on 2/6.  Status post [facility] on 2/13. -Nephrology following -Received HD on 2/14   Diabetes mellitus type 2, uncontrolled with hyperglycemia -Hemoglobin A1c 10.2 on 05/05/2023 CBG (last 3)  Recent Labs    05/20/23 1120 05/20/23 1557 05/21/23 0945  GLUCAP 126* 92 265*   -Continue Semglee 20 units twice daily, NovoLog 6 units 3 times daily AC, resistant SSI  Obesity class II Estimated body mass index is 38.66 kg/m as calculated from the following:   Height as of this encounter: 6\' 1"  (1.854 m).   Weight as of this encounter: 132.9 kg.  Code Status: Full CODE STATUS DVT Prophylaxis:  Place TED hose Start: 05/17/23 1318 apixaban (ELIQUIS) tablet 5 mg   Level of Care: Level of care: Med-Surg Family Communication: Updated patient Disposition Plan:      Remains inpatient appropriate: CIR once medically ready   Procedures:  Intubation, extubation 2D echo Cardiac cath [facility] placement Hemodialysis  Consultants:   Cardiology, nephrology, CCM  Antimicrobials:   Anti-infectives (From admission, onward)    Start     Dose/Rate Route Frequency Ordered Stop   05/19/23 1505  ceFAZolin (ANCEF) IVPB 2g/100 mL premix        over 30 Minutes Intravenous Continuous PRN 05/19/23 1519 05/19/23 1505   05/12/23 2200  meropenem (MERREM) 1 g in sodium chloride 0.9 % 100 mL IVPB        1 g 200 mL/hr over 30 Minutes Intravenous Every 12 hours 05/12/23 0755 05/14/23 2148   05/10/23 0000  vancomycin (VANCOREADY) IVPB 1500 mg/300 mL  Status:  Discontinued        1,500 mg 150 mL/hr over 120 Minutes Intravenous Every 24 hours 05/09/23 0945 05/10/23 0808   05/09/23 1045  vancomycin (VANCOREADY) IVPB 2000 mg/400 mL        2,000 mg 200 mL/hr over 120 Minutes Intravenous  Once 05/09/23 0945 05/09/23 1222   05/08/23 1600   meropenem (MERREM) 1 g in sodium chloride 0.9 % 100 mL IVPB  Status:  Discontinued        1 g 200 mL/hr over 30 Minutes Intravenous Every 8 hours 05/08/23 1505 05/12/23 0755   05/08/23 1500  piperacillin-tazobactam (ZOSYN) IVPB 3.375 g  Status:  Discontinued        3.375 g 100 mL/hr over 30 Minutes Intravenous Every 6 hours 05/08/23 0833 05/08/23 1444   05/07/23 1400  piperacillin-tazobactam (ZOSYN) IVPB 3.375 g  Status:  Discontinued        3.375 g 12.5 mL/hr over 240 Minutes  Intravenous Every 8 hours 05/07/23 0836 05/08/23 0832   05/07/23 1200  ceFEPIme (MAXIPIME) 2 g in sodium chloride 0.9 % 100 mL IVPB  Status:  Discontinued        2 g 200 mL/hr over 30 Minutes Intravenous Every 12 hours 05/06/23 2346 05/07/23 0827   05/07/23 0930  piperacillin-tazobactam (ZOSYN) IVPB 3.375 g        3.375 g 100 mL/hr over 30 Minutes Intravenous  Once 05/07/23 0836 05/07/23 1200   05/07/23 0030  ceFEPIme (MAXIPIME) 2 g in sodium chloride 0.9 % 100 mL IVPB        2 g 200 mL/hr over 30 Minutes Intravenous  Once 05/06/23 2339 05/07/23 0100   05/07/23 0030  metroNIDAZOLE (FLAGYL) IVPB 500 mg  Status:  Discontinued        500 mg 100 mL/hr over 60 Minutes Intravenous Every 12 hours 05/06/23 2339 05/07/23 0827          Medications  amiodarone  200 mg Oral BID   apixaban  5 mg Oral BID   aspirin EC  81 mg Oral Daily   bacitracin   Topical BID   Chlorhexidine Gluconate Cloth  6 each Topical Daily   famotidine  20 mg Oral Daily   feeding supplement  237 mL Oral BID BM   insulin aspart  0-20 Units Subcutaneous TID WC   insulin aspart  6 Units Subcutaneous TID WC   insulin glargine-yfgn  20 Units Subcutaneous BID   lidocaine  1 patch Transdermal Q24H   methocarbamol  500 mg Oral BID   midodrine  5 mg Oral TID WC   multivitamin  1 tablet Oral QHS   nicotine  14 mg Transdermal Daily   oxyCODONE  10 mg Oral Q6H      Subjective:   Dylan Owen was seen and examined today.  Sitting up in the  bed, eating breakfast, no acute complaints.  States received to dialysis on 2/14 before transferring to the floor.  Still on 6 L O2 via [place].  No acute chest pain, shortness of breath, abdominal pain, nausea or vomiting.  No acute events overnight.    Objective:   Vitals:   05/21/23 0300 05/21/23 0400 05/21/23 0555 05/21/23 0615  BP: 104/64 (!) 109/54 (!) 107/57 (!) 97/47  Pulse: 83 85 (!) 104 95  Resp: 12 (!) 21 16   Temp:  97.6 F (36.4 C) 97.7 F (36.5 C) 97.6 F (36.4 C)  TempSrc:  Axillary Oral Oral  SpO2: 98% 98% 100% 95%  Weight:   132.9 kg   Height:   6\' 1"  (1.854 m)     Intake/Output Summary (Last 24 hours) at 05/21/2023 0957 Last data filed at 05/21/2023 0600 Gross per 24 hour  Intake 480 ml  Output 1510 ml  Net -1030 ml     Wt Readings from Last 3 Encounters:  05/21/23 132.9 kg  05/04/23 (!) 148.8 kg  12/09/22 (!) 153.3 kg     Exam General: Alert and oriented x 3, NAD, ill-appearing Cardiovascular: S1 S2 auscultated,  RRR Respiratory: Diminished breath sound at the bases Gastrointestinal: Soft, nontender, nondistended, + bowel sounds Ext: 1+ pedal edema bilaterally Neuro: Strength 5/5 upper and lower extremities bilaterally Psych: Normal affect     Data Reviewed:  I have personally reviewed following labs    CBC Lab Results  Component Value Date   WBC 12.1 (H) 05/21/2023   RBC 4.21 (L) 05/21/2023   HGB 11.0 (L) 05/21/2023  HCT 34.6 (L) 05/21/2023   MCV 82.2 05/21/2023   MCH 26.1 05/21/2023   PLT 308 05/21/2023   MCHC 31.8 05/21/2023   RDW 17.3 (H) 05/21/2023   LYMPHSABS 0.7 05/06/2023   MONOABS 2.3 (H) 05/06/2023   EOSABS 0.0 05/06/2023   BASOSABS 0.1 05/06/2023     Last metabolic panel Lab Results  Component Value Date   NA 130 (L) 05/21/2023   K 5.1 05/21/2023   CL 92 (L) 05/21/2023   CO2 25 05/21/2023   BUN 75 (H) 05/21/2023   CREATININE 4.99 (H) 05/21/2023   GLUCOSE 146 (H) 05/21/2023   GFRNONAA 13 (L) 05/21/2023   GFRAA >90  10/09/2011   CALCIUM 8.5 (L) 05/21/2023   PHOS 4.5 05/21/2023   PROT 5.9 (L) 05/16/2023   ALBUMIN 2.2 (L) 05/21/2023   BILITOT 2.0 (H) 05/16/2023   ALKPHOS 85 05/16/2023   AST 35 05/16/2023   ALT 40 05/16/2023   ANIONGAP 13 05/21/2023    CBG (last 3)  Recent Labs    05/20/23 1120 05/20/23 1557 05/21/23 0945  GLUCAP 126* 92 265*      Coagulation Profile: Recent Labs  Lab 05/15/23 0445 05/16/23 0335 05/17/23 0843 05/18/23 0540 05/19/23 0448  INR 1.8* 1.8* 1.6* 1.5* 1.5*     Radiology Studies: I have personally reviewed the imaging studies  IR Fluoro Guide CV Line Right Result Date: 05/20/2023 INDICATION: 58 year old with renal failure and needs a tunneled dialysis catheter. EXAM: FLUOROSCOPIC AND ULTRASOUND GUIDED PLACEMENT OF A TUNNELED DIALYSIS CATHETER Physician: Rachelle Hora. Lowella Dandy, MD MEDICATIONS: Ancef 2 g; The antibiotic was administered within an appropriate time interval prior to skin puncture. ANESTHESIA/SEDATION: Moderate (conscious) sedation was employed during this procedure. A total of Versed 0.5mg  and fentanyl 25 mcg was administered intravenously at the order of the provider performing the procedure. Total intra-service moderate sedation time: 23 minutes. Patient's level of consciousness and vital signs were monitored continuously by radiology nurse throughout the procedure under the supervision of the provider performing the procedure. FLUOROSCOPY TIME:  Radiation Exposure Index (as provided by the fluoroscopic device): 6 mGy Kerma COMPLICATIONS: None immediate. PROCEDURE: Informed consent was obtained for placement of a tunneled dialysis catheter. The patient was placed supine on the interventional table. Ultrasound confirmed a patent right internal jugular vein. Ultrasound image obtained for documentation. The right neck and chest was prepped and draped in a sterile fashion. Maximal barrier sterile technique was utilized including caps, mask, sterile gowns, sterile  gloves, sterile drape, hand hygiene and skin antiseptic. The right neck was anesthetized with 1% lidocaine. A small incision was made with #11 blade scalpel. A 21 gauge needle directed into the right internal jugular vein with ultrasound guidance. A micropuncture dilator set was placed. A 23 cm tip to cuff Palindrome catheter was selected. The skin below the right clavicle was anesthetized and a small incision was made with an #11 blade scalpel. A subcutaneous tunnel was formed to the vein dermatotomy site. The catheter was brought through the tunnel. The vein dermatotomy site was dilated to accommodate a peel-away sheath over a wire. The catheter was placed through the peel-away sheath and directed into the central venous structures. The tip of the catheter was placed at superior cavoatrial junction with fluoroscopy. Fluoroscopic images were obtained for documentation. Both lumens were found to aspirate and flush well. The proper amount of heparin was flushed in both lumens. The vein dermatotomy site was closed using a single layer of absorbable suture and Dermabond. The catheter was secured  to the skin using Prolene suture. IMPRESSION: Successful placement of a right jugular tunneled dialysis catheter using ultrasound and fluoroscopic guidance. Electronically Signed   By: Richarda Overlie M.D.   On: 05/20/2023 07:37   IR US Guide Vasc Access Right Result Date: 05/20/2023 INDICATION: 58 year old with renal failure and needs a tunneled dialysis catheter. EXAM: FLUOROSCOPIC AND ULTRASOUND GUIDED PLACEMENT OF A TUNNELED DIALYSIS CATHETER Physician: Rachelle Hora. Lowella Dandy, MD MEDICATIONS: Ancef 2 g; The antibiotic was administered within an appropriate time interval prior to skin puncture. ANESTHESIA/SEDATION: Moderate (conscious) sedation was employed during this procedure. A total of Versed 0.5mg  and fentanyl 25 mcg was administered intravenously at the order of the provider performing the procedure. Total intra-service moderate  sedation time: 23 minutes. Patient's level of consciousness and vital signs were monitored continuously by radiology nurse throughout the procedure under the supervision of the provider performing the procedure. FLUOROSCOPY TIME:  Radiation Exposure Index (as provided by the fluoroscopic device): 6 mGy Kerma COMPLICATIONS: None immediate. PROCEDURE: Informed consent was obtained for placement of a tunneled dialysis catheter. The patient was placed supine on the interventional table. Ultrasound confirmed a patent right internal jugular vein. Ultrasound image obtained for documentation. The right neck and chest was prepped and draped in a sterile fashion. Maximal barrier sterile technique was utilized including caps, mask, sterile gowns, sterile gloves, sterile drape, hand hygiene and skin antiseptic. The right neck was anesthetized with 1% lidocaine. A small incision was made with #11 blade scalpel. A 21 gauge needle directed into the right internal jugular vein with ultrasound guidance. A micropuncture dilator set was placed. A 23 cm tip to cuff Palindrome catheter was selected. The skin below the right clavicle was anesthetized and a small incision was made with an #11 blade scalpel. A subcutaneous tunnel was formed to the vein dermatotomy site. The catheter was brought through the tunnel. The vein dermatotomy site was dilated to accommodate a peel-away sheath over a wire. The catheter was placed through the peel-away sheath and directed into the central venous structures. The tip of the catheter was placed at superior cavoatrial junction with fluoroscopy. Fluoroscopic images were obtained for documentation. Both lumens were found to aspirate and flush well. The proper amount of heparin was flushed in both lumens. The vein dermatotomy site was closed using a single layer of absorbable suture and Dermabond. The catheter was secured to the skin using Prolene suture. IMPRESSION: Successful placement of a right jugular  tunneled dialysis catheter using ultrasound and fluoroscopic guidance. Electronically Signed   By: Richarda Overlie M.D.   On: 05/20/2023 07:37   DG CHEST PORT 1 VIEW Result Date: 05/19/2023 CLINICAL DATA:  Cough and shortness of breath. EXAM: PORTABLE CHEST 1 VIEW COMPARISON:  05/13/2023 FINDINGS: Previous left-sided dialysis catheter is been removed. There is a new right-sided dialysis catheter with tip at the atrial caval junction. Right upper extremity PICC tip is again seen. No pneumothorax. Mild cardiomegaly is similar. Ill-defined patchy bibasilar opacities, similar to prior exam. No pleural fluid. IMPRESSION: 1. Similar patchy bibasilar opacities. 2. Previous left-sided dialysis catheter is been removed. There is a new right-sided dialysis catheter with tip at the atrial caval junction. No pneumothorax. Electronically Signed   By: Narda Rutherford M.D.   On: 05/19/2023 18:20       Dylan Owen M.D. [facility] 05/21/2023, 9:57 AM  Available via Epic secure chat 7am-7pm After 7 pm, please refer to night coverage provider listed on amion.

## 2023-05-21 NOTE — Progress Notes (Signed)
 Patient ID: Dylan Owen, male   DOB: 1965/08/16, 58 y.o.   MRN: [id number] [facility] Progress Note   Assessment/ Plan:   1. Acute kidney Injury: With essentially normal renal function at baseline (creatinine 1.1) and suffered ATN in the setting of shock.  CRRT early on.  He is now non oliguric but BUN and crt cont to rise slowly.   With inc UOP-  will do HD now PRN, likely watch over the weekend for signs of recovery - s/p [facility] 2/13  - HD 2/14 - looks like he is autodiuresing- hopeful for recovery  2.  Mixed cardiogenic/septic shock: Clinically suspected to be associated with viral myocarditis/acute CHF exacerbation.  CRRT discontinued over the weekend then 3-4 L net positive; urine output appears to be picking up- utilized lasix but now will hold.  Will undertake dialysis today with goal of ultrafiltration 2.5 L as tolerated.  Now less because made so much urine and on room air  3.  Shock liver: Transaminases appear to be trending down with hemodynamic support.  Hemodynamically improving. 4.  Acute hypoxic/hypercarbic respiratory failure: Tolerating supplemental oxygen via nasal cannula after earlier requiring intubation/ventilator support and intermittent NIPPV.-  now on room air 5. Anemia-  not an issue   Subjective:    HD yesterday- did well.  UOP increasing.     Objective:   BP (!) 106/52 (BP Location: Left Arm)   Pulse 100   Temp 98.5 F (36.9 C) (Oral)   Resp 16   Ht 6\' 1"  (1.854 m)   Wt 132.9 kg   SpO2 94%   BMI 38.66 kg/m   Intake/Output Summary (Last 24 hours) at 05/21/2023 1050 Last data filed at 05/21/2023 1000 Gross per 24 hour  Intake 720 ml  Output 1510 ml  Net -790 ml   Weight change: 0 kg  Physical Exam: Gen: Awake and alert, watching television-  very talkative -  new TDC  CVS: Pulse regular rhythm, normal rate, S1 and S2 normal Resp: Anteriorly clear to auscultation, no rales/rhonchi Abd: Soft, obese, nontender, bowel sounds  normal Ext: trace bilateral lower extremity edema with skin wrinkling, trace-1+ upper extremity edema with some dependent edema  Imaging: IR Fluoro Guide CV Line Right Result Date: 05/20/2023 INDICATION: 58 year old with renal failure and needs a tunneled dialysis catheter. EXAM: FLUOROSCOPIC AND ULTRASOUND GUIDED PLACEMENT OF A TUNNELED DIALYSIS CATHETER Physician: Rachelle Hora. Lowella Dandy, MD MEDICATIONS: Ancef 2 g; The antibiotic was administered within an appropriate time interval prior to skin puncture. ANESTHESIA/SEDATION: Moderate (conscious) sedation was employed during this procedure. A total of Versed 0.5mg  and fentanyl 25 mcg was administered intravenously at the order of the provider performing the procedure. Total intra-service moderate sedation time: 23 minutes. Patient's level of consciousness and vital signs were monitored continuously by radiology nurse throughout the procedure under the supervision of the provider performing the procedure. FLUOROSCOPY TIME:  Radiation Exposure Index (as provided by the fluoroscopic device): 6 mGy Kerma COMPLICATIONS: None immediate. PROCEDURE: Informed consent was obtained for placement of a tunneled dialysis catheter. The patient was placed supine on the interventional table. Ultrasound confirmed a patent right internal jugular vein. Ultrasound image obtained for documentation. The right neck and chest was prepped and draped in a sterile fashion. Maximal barrier sterile technique was utilized including caps, mask, sterile gowns, sterile gloves, sterile drape, hand hygiene and skin antiseptic. The right neck was anesthetized with 1% lidocaine. A small incision was made with #11 blade scalpel. A 21 gauge needle  directed into the right internal jugular vein with ultrasound guidance. A micropuncture dilator set was placed. A 23 cm tip to cuff Palindrome catheter was selected. The skin below the right clavicle was anesthetized and a small incision was made with an #11 blade  scalpel. A subcutaneous tunnel was formed to the vein dermatotomy site. The catheter was brought through the tunnel. The vein dermatotomy site was dilated to accommodate a peel-away sheath over a wire. The catheter was placed through the peel-away sheath and directed into the central venous structures. The tip of the catheter was placed at superior cavoatrial junction with fluoroscopy. Fluoroscopic images were obtained for documentation. Both lumens were found to aspirate and flush well. The proper amount of heparin was flushed in both lumens. The vein dermatotomy site was closed using a single layer of absorbable suture and Dermabond. The catheter was secured to the skin using Prolene suture. IMPRESSION: Successful placement of a right jugular tunneled dialysis catheter using ultrasound and fluoroscopic guidance. Electronically Signed   By: Richarda Overlie M.D.   On: 05/20/2023 07:37   IR US Guide Vasc Access Right Result Date: 05/20/2023 INDICATION: 58 year old with renal failure and needs a tunneled dialysis catheter. EXAM: FLUOROSCOPIC AND ULTRASOUND GUIDED PLACEMENT OF A TUNNELED DIALYSIS CATHETER Physician: Rachelle Hora. Lowella Dandy, MD MEDICATIONS: Ancef 2 g; The antibiotic was administered within an appropriate time interval prior to skin puncture. ANESTHESIA/SEDATION: Moderate (conscious) sedation was employed during this procedure. A total of Versed 0.5mg  and fentanyl 25 mcg was administered intravenously at the order of the provider performing the procedure. Total intra-service moderate sedation time: 23 minutes. Patient's level of consciousness and vital signs were monitored continuously by radiology nurse throughout the procedure under the supervision of the provider performing the procedure. FLUOROSCOPY TIME:  Radiation Exposure Index (as provided by the fluoroscopic device): 6 mGy Kerma COMPLICATIONS: None immediate. PROCEDURE: Informed consent was obtained for placement of a tunneled dialysis catheter. The patient  was placed supine on the interventional table. Ultrasound confirmed a patent right internal jugular vein. Ultrasound image obtained for documentation. The right neck and chest was prepped and draped in a sterile fashion. Maximal barrier sterile technique was utilized including caps, mask, sterile gowns, sterile gloves, sterile drape, hand hygiene and skin antiseptic. The right neck was anesthetized with 1% lidocaine. A small incision was made with #11 blade scalpel. A 21 gauge needle directed into the right internal jugular vein with ultrasound guidance. A micropuncture dilator set was placed. A 23 cm tip to cuff Palindrome catheter was selected. The skin below the right clavicle was anesthetized and a small incision was made with an #11 blade scalpel. A subcutaneous tunnel was formed to the vein dermatotomy site. The catheter was brought through the tunnel. The vein dermatotomy site was dilated to accommodate a peel-away sheath over a wire. The catheter was placed through the peel-away sheath and directed into the central venous structures. The tip of the catheter was placed at superior cavoatrial junction with fluoroscopy. Fluoroscopic images were obtained for documentation. Both lumens were found to aspirate and flush well. The proper amount of heparin was flushed in both lumens. The vein dermatotomy site was closed using a single layer of absorbable suture and Dermabond. The catheter was secured to the skin using Prolene suture. IMPRESSION: Successful placement of a right jugular tunneled dialysis catheter using ultrasound and fluoroscopic guidance. Electronically Signed   By: Richarda Overlie M.D.   On: 05/20/2023 07:37   DG CHEST PORT 1 VIEW Result Date:  05/19/2023 CLINICAL DATA:  Cough and shortness of breath. EXAM: PORTABLE CHEST 1 VIEW COMPARISON:  05/13/2023 FINDINGS: Previous left-sided dialysis catheter is been removed. There is a new right-sided dialysis catheter with tip at the atrial caval junction.  Right upper extremity PICC tip is again seen. No pneumothorax. Mild cardiomegaly is similar. Ill-defined patchy bibasilar opacities, similar to prior exam. No pleural fluid. IMPRESSION: 1. Similar patchy bibasilar opacities. 2. Previous left-sided dialysis catheter is been removed. There is a new right-sided dialysis catheter with tip at the atrial caval junction. No pneumothorax. Electronically Signed   By: Narda Rutherford M.D.   On: 05/19/2023 18:20     Labs: BMET Recent Labs  Lab 05/15/23 0421 05/16/23 0335 05/17/23 0428 05/18/23 0540 05/19/23 0448 05/20/23 0432 05/21/23 0458  NA 132* 131* 134* 130* 132* 128* 130*  K 4.3 4.6 4.2 4.4 4.8 5.5* 5.1  CL 97* 91* 98 95* 95* 93* 92*  CO2 19* 20* 23 22 22  21* 25  GLUCOSE 179* 271* 202* 313* 241* 351* 146*  BUN 72* 95* 54* 84* 105* 118* 75*  CREATININE 5.47* 6.86* 4.66* 6.31* 6.74* 6.96* 4.99*  CALCIUM 8.0* 8.8* 8.1* 8.1* 8.5* 8.7* 8.5*  PHOS 4.1 3.1 3.7 4.2 4.3 4.6 4.5   CBC Recent Labs  Lab 05/18/23 0540 05/19/23 0448 05/20/23 0432 05/21/23 0634  WBC 14.7* 14.8* 14.9* 12.1*  HGB 11.6* 11.3* 12.4* 11.0*  HCT 35.5* 34.5* 37.8* 34.6*  MCV 79.4* 79.9* 80.3 82.2  PLT 205 263 334 308    Medications:     amiodarone  200 mg Oral BID   apixaban  5 mg Oral BID   aspirin EC  81 mg Oral Daily   bacitracin   Topical BID   Chlorhexidine Gluconate Cloth  6 each Topical Daily   famotidine  20 mg Oral Daily   feeding supplement  237 mL Oral BID BM   insulin aspart  0-20 Units Subcutaneous TID WC   insulin aspart  6 Units Subcutaneous TID WC   insulin glargine-yfgn  20 Units Subcutaneous BID   lidocaine  1 patch Transdermal Q24H   methocarbamol  500 mg Oral BID   midodrine  5 mg Oral TID WC   multivitamin  1 tablet Oral QHS   nicotine  14 mg Transdermal Daily   oxyCODONE  10 mg Oral Q6H    Alysse Rathe  05/21/2023, 10:50 AM

## 2023-05-21 NOTE — Plan of Care (Signed)
 Problem: Education: Goal: Ability to describe self-care measures that may prevent or decrease complications (Diabetes Survival Skills Education) will improve Outcome: Progressing Goal: Individualized Educational Video(s) Outcome: Progressing   Problem: Coping: Goal: Ability to adjust to condition or change in health will improve Outcome: Progressing   Problem: Fluid Volume: Goal: Ability to maintain a balanced intake and output will improve Outcome: Progressing   Problem: Health Behavior/Discharge Planning: Goal: Ability to identify and utilize available resources and services will improve Outcome: Progressing Goal: Ability to manage health-related needs will improve Outcome: Progressing   Problem: Metabolic: Goal: Ability to maintain appropriate glucose levels will improve Outcome: Progressing   Problem: Nutritional: Goal: Maintenance of adequate nutrition will improve Outcome: Progressing Goal: Progress toward achieving an optimal weight will improve Outcome: Progressing   Problem: Skin Integrity: Goal: Risk for impaired skin integrity will decrease Outcome: Progressing   Problem: Tissue Perfusion: Goal: Adequacy of tissue perfusion will improve Outcome: Progressing   Problem: Education: Goal: Knowledge of General Education information will improve Description: Including pain rating scale, medication(s)/side effects and non-pharmacologic comfort measures Outcome: Progressing   Problem: Health Behavior/Discharge Planning: Goal: Ability to manage health-related needs will improve Outcome: Progressing   Problem: Clinical Measurements: Goal: Ability to maintain clinical measurements within normal limits will improve Outcome: Progressing Goal: Will remain free from infection Outcome: Progressing Goal: Diagnostic test results will improve Outcome: Progressing Goal: Respiratory complications will improve Outcome: Progressing Goal: Cardiovascular complication will  be avoided Outcome: Progressing   Problem: Activity: Goal: Risk for activity intolerance will decrease Outcome: Progressing   Problem: Nutrition: Goal: Adequate nutrition will be maintained Outcome: Progressing   Problem: Coping: Goal: Level of anxiety will decrease Outcome: Progressing   Problem: Elimination: Goal: Will not experience complications related to bowel motility Outcome: Progressing Goal: Will not experience complications related to urinary retention Outcome: Progressing   Problem: Pain Managment: Goal: General experience of comfort will improve and/or be controlled Outcome: Progressing   Problem: Safety: Goal: Ability to remain free from injury will improve Outcome: Progressing   Problem: Skin Integrity: Goal: Risk for impaired skin integrity will decrease Outcome: Progressing   Problem: Education: Goal: Knowledge of General Education information will improve Description: Including pain rating scale, medication(s)/side effects and non-pharmacologic comfort measures Outcome: Progressing   Problem: Health Behavior/Discharge Planning: Goal: Ability to manage health-related needs will improve Outcome: Progressing   Problem: Clinical Measurements: Goal: Ability to maintain clinical measurements within normal limits will improve Outcome: Progressing Goal: Will remain free from infection Outcome: Progressing Goal: Diagnostic test results will improve Outcome: Progressing Goal: Respiratory complications will improve Outcome: Progressing Goal: Cardiovascular complication will be avoided Outcome: Progressing   Problem: Activity: Goal: Risk for activity intolerance will decrease Outcome: Progressing   Problem: Nutrition: Goal: Adequate nutrition will be maintained Outcome: Progressing   Problem: Coping: Goal: Level of anxiety will decrease Outcome: Progressing   Problem: Elimination: Goal: Will not experience complications related to bowel  motility Outcome: Progressing Goal: Will not experience complications related to urinary retention Outcome: Progressing   Problem: Pain Managment: Goal: General experience of comfort will improve and/or be controlled Outcome: Progressing   Problem: Safety: Goal: Ability to remain free from injury will improve Outcome: Progressing   Problem: Skin Integrity: Goal: Risk for impaired skin integrity will decrease Outcome: Progressing   Problem: Education: Goal: Knowledge of disease and its progression will improve Outcome: Progressing   Problem: Health Behavior/Discharge Planning: Goal: Ability to manage health-related needs will improve Outcome: Progressing   Problem: Clinical Measurements:  Goal: Complications related to the disease process or treatment will be avoided or minimized Outcome: Progressing Goal: Dialysis access will remain free of complications Outcome: Progressing   Problem: Activity: Goal: Activity intolerance will improve Outcome: Progressing   Problem: Fluid Volume: Goal: Fluid volume balance will be maintained or improved Outcome: Progressing   Problem: Nutritional: Goal: Ability to make appropriate dietary choices will improve Outcome: Progressing   Problem: Respiratory: Goal: Respiratory symptoms related to disease process will be avoided Outcome: Progressing   Problem: Self-Concept: Goal: Body image disturbance will be avoided or minimized Outcome: Progressing   Problem: Urinary Elimination: Goal: Progression of disease will be identified and treated Outcome: Progressing   Problem: Education: Goal: Understanding of CV disease, CV risk reduction, and recovery process will improve Outcome: Progressing Goal: Individualized Educational Video(s) Outcome: Progressing   Problem: Activity: Goal: Ability to return to baseline activity level will improve Outcome: Progressing   Problem: Cardiovascular: Goal: Ability to achieve and maintain  adequate cardiovascular perfusion will improve Outcome: Progressing Goal: Vascular access site(s) Level 0-1 will be maintained Outcome: Progressing   Problem: Health Behavior/Discharge Planning: Goal: Ability to safely manage health-related needs after discharge will improve Outcome: Progressing

## 2023-05-21 NOTE — Plan of Care (Signed)
 Problem: Education: Goal: Ability to describe self-care measures that may prevent or decrease complications (Diabetes Survival Skills Education) will improve Outcome: Progressing Goal: Individualized Educational Video(s) Outcome: Progressing   Problem: Coping: Goal: Ability to adjust to condition or change in health will improve Outcome: Progressing   Problem: Fluid Volume: Goal: Ability to maintain a balanced intake and output will improve Outcome: Progressing   Problem: Health Behavior/Discharge Planning: Goal: Ability to identify and utilize available resources and services will improve Outcome: Progressing Goal: Ability to manage health-related needs will improve Outcome: Progressing   Problem: Metabolic: Goal: Ability to maintain appropriate glucose levels will improve Outcome: Progressing   Problem: Nutritional: Goal: Maintenance of adequate nutrition will improve Outcome: Progressing Goal: Progress toward achieving an optimal weight will improve Outcome: Progressing   Problem: Skin Integrity: Goal: Risk for impaired skin integrity will decrease Outcome: Progressing   Problem: Tissue Perfusion: Goal: Adequacy of tissue perfusion will improve Outcome: Progressing   Problem: Education: Goal: Knowledge of General Education information will improve Description: Including pain rating scale, medication(s)/side effects and non-pharmacologic comfort measures Outcome: Progressing   Problem: Health Behavior/Discharge Planning: Goal: Ability to manage health-related needs will improve Outcome: Progressing   Problem: Clinical Measurements: Goal: Ability to maintain clinical measurements within normal limits will improve Outcome: Progressing Goal: Will remain free from infection Outcome: Progressing Goal: Diagnostic test results will improve Outcome: Progressing Goal: Respiratory complications will improve Outcome: Progressing Goal: Cardiovascular complication will  be avoided Outcome: Progressing   Problem: Activity: Goal: Risk for activity intolerance will decrease Outcome: Progressing   Problem: Nutrition: Goal: Adequate nutrition will be maintained Outcome: Progressing   Problem: Coping: Goal: Level of anxiety will decrease Outcome: Progressing   Problem: Elimination: Goal: Will not experience complications related to bowel motility Outcome: Progressing Goal: Will not experience complications related to urinary retention Outcome: Progressing   Problem: Pain Managment: Goal: General experience of comfort will improve and/or be controlled Outcome: Progressing   Problem: Safety: Goal: Ability to remain free from injury will improve Outcome: Progressing   Problem: Skin Integrity: Goal: Risk for impaired skin integrity will decrease Outcome: Progressing   Problem: Education: Goal: Knowledge of General Education information will improve Description: Including pain rating scale, medication(s)/side effects and non-pharmacologic comfort measures Outcome: Progressing   Problem: Health Behavior/Discharge Planning: Goal: Ability to manage health-related needs will improve Outcome: Progressing   Problem: Clinical Measurements: Goal: Ability to maintain clinical measurements within normal limits will improve Outcome: Progressing Goal: Will remain free from infection Outcome: Progressing Goal: Diagnostic test results will improve Outcome: Progressing Goal: Respiratory complications will improve Outcome: Progressing Goal: Cardiovascular complication will be avoided Outcome: Progressing   Problem: Activity: Goal: Risk for activity intolerance will decrease Outcome: Progressing   Problem: Nutrition: Goal: Adequate nutrition will be maintained Outcome: Progressing   Problem: Coping: Goal: Level of anxiety will decrease Outcome: Progressing   Problem: Elimination: Goal: Will not experience complications related to bowel  motility Outcome: Progressing Goal: Will not experience complications related to urinary retention Outcome: Progressing   Problem: Pain Managment: Goal: General experience of comfort will improve and/or be controlled Outcome: Progressing   Problem: Safety: Goal: Ability to remain free from injury will improve Outcome: Progressing   Problem: Skin Integrity: Goal: Risk for impaired skin integrity will decrease Outcome: Progressing   Problem: Education: Goal: Knowledge of disease and its progression will improve Outcome: Progressing   Problem: Health Behavior/Discharge Planning: Goal: Ability to manage health-related needs will improve Outcome: Progressing   Problem: Clinical Measurements:  Goal: Complications related to the disease process or treatment will be avoided or minimized Outcome: Progressing Goal: Dialysis access will remain free of complications Outcome: Progressing

## 2023-05-21 NOTE — Progress Notes (Signed)
 Patient having nose bleeds x2 today. Patient also appears to have blood in urine bag of foley. MD Rai notified.

## 2023-05-22 ENCOUNTER — Other Ambulatory Visit: Payer: Self-pay | Admitting: Family Medicine

## 2023-05-22 DIAGNOSIS — I509 Heart failure, unspecified: Secondary | ICD-10-CM | POA: Diagnosis not present

## 2023-05-22 DIAGNOSIS — I5021 Acute systolic (congestive) heart failure: Secondary | ICD-10-CM | POA: Diagnosis not present

## 2023-05-22 DIAGNOSIS — A419 Sepsis, unspecified organism: Secondary | ICD-10-CM | POA: Diagnosis not present

## 2023-05-22 DIAGNOSIS — G4733 Obstructive sleep apnea (adult) (pediatric): Secondary | ICD-10-CM | POA: Diagnosis not present

## 2023-05-22 LAB — RENAL FUNCTION PANEL
Albumin: 2.2 g/dL — ABNORMAL LOW (ref 3.5–5.0)
Anion gap: 14 (ref 5–15)
BUN: 98 mg/dL — ABNORMAL HIGH (ref 6–20)
CO2: 24 mmol/L (ref 22–32)
Calcium: 8.9 mg/dL (ref 8.9–10.3)
Chloride: 93 mmol/L — ABNORMAL LOW (ref 98–111)
Creatinine, Ser: 5.55 mg/dL — ABNORMAL HIGH (ref 0.61–1.24)
GFR, Estimated: 11 mL/min — ABNORMAL LOW (ref >60)
Glucose, Bld: 205 mg/dL — ABNORMAL HIGH (ref 70–99)
Phosphorus: 5.1 mg/dL — ABNORMAL HIGH (ref 2.5–4.6)
Potassium: 5.1 mmol/L (ref 3.5–5.1)
Sodium: 131 mmol/L — ABNORMAL LOW (ref 135–145)

## 2023-05-22 LAB — CBC
HCT: 31.9 % — ABNORMAL LOW (ref 39.0–52.0)
Hemoglobin: 10.3 g/dL — ABNORMAL LOW (ref 13.0–17.0)
MCH: 26.5 pg (ref 26.0–34.0)
MCHC: 32.3 g/dL (ref 30.0–36.0)
MCV: 82 fL (ref 80.0–100.0)
Platelets: 299 10*3/uL (ref 150–400)
RBC: 3.89 MIL/uL — ABNORMAL LOW (ref 4.22–5.81)
RDW: 17.2 % — ABNORMAL HIGH (ref 11.5–15.5)
WBC: 11.7 10*3/uL — ABNORMAL HIGH (ref 4.0–10.5)
nRBC: 0 % (ref 0.0–0.2)

## 2023-05-22 LAB — GLUCOSE, CAPILLARY
Glucose-Capillary: 292 mg/dL — ABNORMAL HIGH (ref 70–99)
Glucose-Capillary: 229 mg/dL — ABNORMAL HIGH (ref 70–99)
Glucose-Capillary: 136 mg/dL — ABNORMAL HIGH (ref 70–99)

## 2023-05-22 LAB — MAGNESIUM: Magnesium: 2.1 mg/dL (ref 1.7–2.4)

## 2023-05-22 MED ORDER — INSULIN GLARGINE-YFGN 100 UNIT/ML ~~LOC~~ SOLN
24.0000 [IU] | Freq: Two times a day (BID) | SUBCUTANEOUS | Status: DC
  Administered 2023-05-22 – 2023-05-23 (×2): 24 [IU] via SUBCUTANEOUS
  Filled 2023-05-22 (×3): qty 0.24

## 2023-05-22 MED ORDER — INSULIN ASPART 100 UNIT/ML IJ SOLN
8.0000 [IU] | Freq: Three times a day (TID) | INTRAMUSCULAR | Status: DC
  Administered 2023-05-22 – 2023-05-23 (×3): 8 [IU] via SUBCUTANEOUS

## 2023-05-22 MED ORDER — INSULIN ASPART 100 UNIT/ML IJ SOLN
0.0000 [IU] | Freq: Three times a day (TID) | INTRAMUSCULAR | Status: DC
  Administered 2023-05-22: 2 [IU] via SUBCUTANEOUS
  Administered 2023-05-22: 5 [IU] via SUBCUTANEOUS
  Administered 2023-05-23: 3 [IU] via SUBCUTANEOUS
  Administered 2023-05-23: 15 [IU] via SUBCUTANEOUS
  Administered 2023-05-23: 2 [IU] via SUBCUTANEOUS
  Administered 2023-05-24 (×2): 5 [IU] via SUBCUTANEOUS
  Administered 2023-05-25: 2 [IU] via SUBCUTANEOUS
  Administered 2023-05-25: 5 [IU] via SUBCUTANEOUS
  Administered 2023-05-26: 2 [IU] via SUBCUTANEOUS
  Administered 2023-05-27: 3 [IU] via SUBCUTANEOUS
  Administered 2023-05-27: 2 [IU] via SUBCUTANEOUS
  Administered 2023-05-27: 3 [IU] via SUBCUTANEOUS
  Administered 2023-05-28: 8 [IU] via SUBCUTANEOUS
  Administered 2023-05-28: 5 [IU] via SUBCUTANEOUS
  Administered 2023-05-28: 11 [IU] via SUBCUTANEOUS
  Administered 2023-05-29: 3 [IU] via SUBCUTANEOUS

## 2023-05-22 NOTE — Progress Notes (Signed)
 Patient ID: Dylan Owen, male   DOB: 1965-04-14, 58 y.o.   MRN: [id number] [facility] Progress Note   Assessment/ Plan:   1. Acute kidney Injury: With essentially normal renal function at baseline (creatinine 1.1) and suffered ATN in the setting of shock.  CRRT early on.  He is now non oliguric but BUN and crt cont to rise slowly.   With inc UOP-  will do HD now PRN, likely watch over the weekend for signs of recovery - s/p [facility] 2/13  - HD 2/14 - looks like he is autodiuresing- hopeful for recovery - anticipate some ongoing dialysis needs- next HD Monday  2.  Mixed cardiogenic/septic shock: Clinically suspected to be associated with viral myocarditis/acute CHF exacerbation.  CRRT discontinued over the weekend then 3-4 L net positive; urine output appears to be picking up- utilized lasix but now will hold.  Will undertake dialysis today with goal of ultrafiltration 2.5 L as tolerated.  Now less because made so much urine and on room air  3.  Shock liver: Transaminases appear to be trending down with hemodynamic support.  Hemodynamically improving. 4.  Acute hypoxic/hypercarbic respiratory failure: Tolerating supplemental oxygen via nasal cannula after earlier requiring intubation/ventilator support and intermittent NIPPV.-  now on room air 5. Anemia-  not an issue   Subjective:    For HD tomorrow.  Rate of rise decreasing but doesn't look out of the woods totally yet   Objective:   BP (!) 132/49 (BP Location: Left Arm)   Pulse (!) 102   Temp 98.1 F (36.7 C) (Oral)   Resp 19   Ht 6\' 1"  (1.854 m)   Wt 132.9 kg   SpO2 96%   BMI 38.66 kg/m   Intake/Output Summary (Last 24 hours) at 05/22/2023 1027 Last data filed at 05/22/2023 0600 Gross per 24 hour  Intake 1030 ml  Output 1000 ml  Net 30 ml   Weight change:   Physical Exam: Gen: Awake and alert, watching television-  very talkative -  new TDC  CVS: Pulse regular rhythm, normal rate, S1 and S2 normal Resp:  Anteriorly clear to auscultation, no rales/rhonchi Abd: Soft, obese, nontender, bowel sounds normal Ext: trace bilateral lower extremity edema with skin wrinkling, trace-1+ upper extremity edema with some dependent edema  Imaging: No results found.    Labs: BMET Recent Labs  Lab 05/16/23 0335 05/17/23 0428 05/18/23 0540 05/19/23 0448 05/20/23 0432 05/21/23 0458 05/22/23 0316  NA 131* 134* 130* 132* 128* 130* 131*  K 4.6 4.2 4.4 4.8 5.5* 5.1 5.1  CL 91* 98 95* 95* 93* 92* 93*  CO2 20* 23 22 22  21* 25 24  GLUCOSE 271* 202* 313* 241* 351* 146* 205*  BUN 95* 54* 84* 105* 118* 75* 98*  CREATININE 6.86* 4.66* 6.31* 6.74* 6.96* 4.99* 5.55*  CALCIUM 8.8* 8.1* 8.1* 8.5* 8.7* 8.5* 8.9  PHOS 3.1 3.7 4.2 4.3 4.6 4.5 5.1*   CBC Recent Labs  Lab 05/19/23 0448 05/20/23 0432 05/21/23 0634 05/22/23 0316  WBC 14.8* 14.9* 12.1* 11.7*  HGB 11.3* 12.4* 11.0* 10.3*  HCT 34.5* 37.8* 34.6* 31.9*  MCV 79.9* 80.3 82.2 82.0  PLT 263 334 308 299    Medications:     amiodarone  200 mg Oral BID   apixaban  5 mg Oral BID   bacitracin   Topical BID   Chlorhexidine Gluconate Cloth  6 each Topical Daily   famotidine  20 mg Oral Daily   feeding supplement  237 mL Oral BID BM   insulin aspart  0-20 Units Subcutaneous TID WC   insulin aspart  6 Units Subcutaneous TID WC   insulin glargine-yfgn  20 Units Subcutaneous BID   lidocaine  1 patch Transdermal Q24H   methocarbamol  500 mg Oral BID   midodrine  5 mg Oral TID WC   multivitamin  1 tablet Oral QHS   nicotine  14 mg Transdermal Daily   oxyCODONE  10 mg Oral Q6H    Lilyann Gravelle  05/22/2023, 10:27 AM

## 2023-05-22 NOTE — Progress Notes (Addendum)
 Patient states he does not wish to wear BIPAP at night and does not wear one at home. Order made PRN.  05/22/23 2111  BiPAP/CPAP/SIPAP  Reason BIPAP/CPAP not in use Non-compliant

## 2023-05-22 NOTE — Progress Notes (Signed)
 [facility]                                                                              Dylan Owen, is a 58 y.o. male, DOB - May 07, 1965, OZH:[id number] Admit date - 05/04/2023    Outpatient Primary MD for the patient is McGowen, Maryjean Morn, MD  LOS - 17  days  Chief Complaint  Patient presents with   Shortness of Breath       Brief summary   Patient is a 58 year old male with obesity, HTN, HLP, diabetes mellitus type 2, OSA, chronic back pain, depression, GERD was admitted on 05/04/2023 with cough, sore throat, dizziness, myalgias for a week.  Patient had also reported chest tightness, headaches, chills, diaphoresis with dyspnea on exertion.  BNP was 1275.  Respiratory virus panel negative.  Patient was placed on Lasix.  Troponins mildly elevated, diagnosis of viral myocarditis was made. 2D echo showed EF ~25% although poor acoustic windows, cardiology was consulted. As of 05/06/2023, creatinine started trending up, was transferred to intensive care unit.  He was placed on BiPAP, milrinone.  Patient was intubated on 2/3 and was extubated on 2/5.  Also started on CRRT, currently on intermittent hemodialysis. Patient was transferred out to the floor and [facility] assumed care on 05/21/2023  Significant Hospital Events:   1/29 Admit to [facility], diuresis 1/30 ECHO w LVEF 25% 1/31 Cards consult, c/f viral myocarditis  2/1 PCCM consult txf to [facility], Adv HF consult  2/2 For HD cath placement and CRRT. Incr D10 for persistent hypogly   2/3 Milrinone stopped. Weaning NE; intubated due to being on bipap for a few days and needing to go to CT  2/5 Extubated. Cleviprex for hypertension   2/6 CT Head for anisocoria, no acute findings. Off clevi. Adding PRN metop. CRRT UF decr to 150/hr.  2/7 On BiPAP. RHC and DCCV  2/8 Off crrt  2/9 Albumin + lasix challenge. HIT neg   2/10: got iHD 2/12: HD cath pulled by pt overnight. Ordered IR for TDC. Holding bivalirudin  2/13: did not get TDC  yesterday because WBC 14.  TDC on 2/13.   2/14: TDC on 2/13, underwent iHD, out of ICU after dialysis.    Assessment & Plan    Sepsis secondary to ? UTI, multifocal MSSA PNA, POA septic shock-resolved -Presented febrile, TEE showed moderately reduced LVEF.  Initial concern for cardiogenic shock with HFrEF, possible viral myocarditis, sequelae of multisystem organ dysfunction.  Per PCCM, likely septic shock, was placed on broad-spectrum antibiotics with meropenem and improved significantly. -Patient had required vasopressors in ICU, currently off -Respiratory virus panel negative, blood cultures 1/31 negative, urine culture showed multiple species.  -Tracheal aspirate showed rare Candida and Staph aureus/MSSA -Patient has completed 7 days of IV meropenem on 05/14/2023 -Sepsis physiology resolved     Acute hypoxic and hypercarbic resp failure, resolving MSSA PNA -Secondary to PNA, acute systolic heart failure, was intubated on 2/3, extubated on 2/5 - Supplemental O2 support for SpO2 > 90%, now on room air - BiPAP PRN + at bedtime - Pulmonary hygiene, encourage OOB/mobility -Completed IV antibiotics -Added humidified O2, on 6  L O2 via [place], wean as tolerated to keep sats above 92%.  History of obstructive sleep apnea -Continue BiPAP at bedtime and O2 via [place] during the day -Outpatient sleep study recommended   New Afib/flutter s/p DCCV 2/7 -- now sinus rhythm Acute systolic HFrEF-- septic cardiomyopathy  pHTN (groud II and III) -Initial concern for viral myocarditis -Initial echo 1/30: Unable to estimate EF due to poor quality - Repeat Echo 2/6 showed EF 40-45%.  - RHC with normal R and L filling pressures, elevated PA mean and PVR. - Initially placed on IV amiodarone drip, transitioned to p.o. amiodarone  -Volume management with intermittent HD -Currently on midodrine -Yesterday, 2/15 had epistaxis and hematuria, hence eliquis was held -H&H stable, no further epistaxis and hematuria  has cleared -Discontinue aspirin, resume Eliquis   Acute kidney injury likely ATN AGMA, resolved  -Nephrology following, essentially normal renal function at baseline, 1.1, worsened due to ATN in the setting of shock. -Placed on CRRT on 2/6.  Status post [facility] on 2/13. -Nephrology following -Received HD on 2/14   Diabetes mellitus type 2, uncontrolled with hyperglycemia -Hemoglobin A1c 10.2 on 05/05/2023 CBG (last 3)  Recent Labs    05/21/23 1649 05/21/23 2203 05/22/23 0717  GLUCAP 167* 179* 292*   -CBGs still high, increased Semglee to 24 units twice daily, increase to NovoLog to 8 units 3 times daily AC, keep moderate SSI -Diabetic coordinator consult  Obesity class II Estimated body mass index is 38.66 kg/m as calculated from the following:   Height as of this encounter: 6\' 1"  (1.854 m).   Weight as of this encounter: 132.9 kg.  Code Status: Full CODE STATUS DVT Prophylaxis:  Place TED hose Start: 05/17/23 1318 apixaban (ELIQUIS) tablet 5 mg   Level of Care: Level of care: Med-Surg Family Communication: Updated patient Disposition Plan:      Remains inpatient appropriate: CIR once medically ready   Procedures:  Intubation, extubation 2D echo Cardiac cath [facility] placement Hemodialysis  Consultants:   Cardiology, nephrology, CCM  Antimicrobials:   Anti-infectives (From admission, onward)    Start     Dose/Rate Route Frequency Ordered Stop   05/19/23 1505  ceFAZolin (ANCEF) IVPB 2g/100 mL premix        over 30 Minutes Intravenous Continuous PRN 05/19/23 1519 05/19/23 1505   05/12/23 2200  meropenem (MERREM) 1 g in sodium chloride 0.9 % 100 mL IVPB        1 g 200 mL/hr over 30 Minutes Intravenous Every 12 hours 05/12/23 0755 05/14/23 2148   05/10/23 0000  vancomycin (VANCOREADY) IVPB 1500 mg/300 mL  Status:  Discontinued        1,500 mg 150 mL/hr over 120 Minutes Intravenous Every 24 hours 05/09/23 0945 05/10/23 0808   05/09/23 1045  vancomycin (VANCOREADY) IVPB  2000 mg/400 mL        2,000 mg 200 mL/hr over 120 Minutes Intravenous  Once 05/09/23 0945 05/09/23 1222   05/08/23 1600  meropenem (MERREM) 1 g in sodium chloride 0.9 % 100 mL IVPB  Status:  Discontinued        1 g 200 mL/hr over 30 Minutes Intravenous Every 8 hours 05/08/23 1505 05/12/23 0755   05/08/23 1500  piperacillin-tazobactam (ZOSYN) IVPB 3.375 g  Status:  Discontinued        3.375 g 100 mL/hr over 30 Minutes Intravenous Every 6 hours 05/08/23 0833 05/08/23 1444   05/07/23 1400  piperacillin-tazobactam (ZOSYN) IVPB 3.375 g  Status:  Discontinued  3.375 g 12.5 mL/hr over 240 Minutes Intravenous Every 8 hours 05/07/23 0836 05/08/23 0832   05/07/23 1200  ceFEPIme (MAXIPIME) 2 g in sodium chloride 0.9 % 100 mL IVPB  Status:  Discontinued        2 g 200 mL/hr over 30 Minutes Intravenous Every 12 hours 05/06/23 2346 05/07/23 0827   05/07/23 0930  piperacillin-tazobactam (ZOSYN) IVPB 3.375 g        3.375 g 100 mL/hr over 30 Minutes Intravenous  Once 05/07/23 0836 05/07/23 1200   05/07/23 0030  ceFEPIme (MAXIPIME) 2 g in sodium chloride 0.9 % 100 mL IVPB        2 g 200 mL/hr over 30 Minutes Intravenous  Once 05/06/23 2339 05/07/23 0100   05/07/23 0030  metroNIDAZOLE (FLAGYL) IVPB 500 mg  Status:  Discontinued        500 mg 100 mL/hr over 60 Minutes Intravenous Every 12 hours 05/06/23 2339 05/07/23 0827          Medications  amiodarone  200 mg Oral BID   apixaban  5 mg Oral BID   bacitracin   Topical BID   Chlorhexidine Gluconate Cloth  6 each Topical Daily   famotidine  20 mg Oral Daily   feeding supplement  237 mL Oral BID BM   insulin aspart  0-20 Units Subcutaneous TID WC   insulin aspart  6 Units Subcutaneous TID WC   insulin glargine-yfgn  20 Units Subcutaneous BID   lidocaine  1 patch Transdermal Q24H   methocarbamol  500 mg Oral BID   midodrine  5 mg Oral TID WC   multivitamin  1 tablet Oral QHS   nicotine  14 mg Transdermal Daily   oxyCODONE  10 mg Oral Q6H       Subjective:   Dylan Owen was seen and examined today.  No acute complaints this morning, epistaxis has resolved.  Hematuria cleared.  On 6 L O2 via [place], changed to humidified O2 yesterday.  No acute chest pain, shortness of breath, abdominal pain, nausea or vomiting..    Objective:   Vitals:   05/21/23 2023 05/22/23 0137 05/22/23 0307 05/22/23 0927  BP: (!) 109/46  132/62 (!) 132/49  Pulse: 98  98 (!) 102  Resp:    19  Temp: 98.2 F (36.8 C)  97.7 F (36.5 C) 98.1 F (36.7 C)  TempSrc: Oral  Oral Oral  SpO2: 96% 94% 97% 96%  Weight:      Height:        Intake/Output Summary (Last 24 hours) at 05/22/2023 1030 Last data filed at 05/22/2023 0600 Gross per 24 hour  Intake 1030 ml  Output 1000 ml  Net 30 ml     Wt Readings from Last 3 Encounters:  05/21/23 132.9 kg  05/04/23 (!) 148.8 kg  12/09/22 (!) 153.3 kg   Physical Exam General: Alert and oriented x 3, NAD Cardiovascular: S1 S2 clear, RRR.  Respiratory: Diminished breath sound at the bases Gastrointestinal: Soft, nontender, nondistended, NBS Ext: 1+ pedal edema bilaterally Neuro: no new deficits Psych: Normal affect    Data Reviewed:  I have personally reviewed following labs    CBC Lab Results  Component Value Date   WBC 11.7 (H) 05/22/2023   RBC 3.89 (L) 05/22/2023   HGB 10.3 (L) 05/22/2023   HCT 31.9 (L) 05/22/2023   MCV 82.0 05/22/2023   MCH 26.5 05/22/2023   PLT 299 05/22/2023   MCHC 32.3 05/22/2023   RDW 17.2 (H)  05/22/2023   LYMPHSABS 0.7 05/06/2023   MONOABS 2.3 (H) 05/06/2023   EOSABS 0.0 05/06/2023   BASOSABS 0.1 05/06/2023     Last metabolic panel Lab Results  Component Value Date   NA 131 (L) 05/22/2023   K 5.1 05/22/2023   CL 93 (L) 05/22/2023   CO2 24 05/22/2023   BUN 98 (H) 05/22/2023   CREATININE 5.55 (H) 05/22/2023   GLUCOSE 205 (H) 05/22/2023   GFRNONAA 11 (L) 05/22/2023   GFRAA >90 10/09/2011   CALCIUM 8.9 05/22/2023   PHOS 5.1 (H) 05/22/2023   PROT 5.9  (L) 05/16/2023   ALBUMIN 2.2 (L) 05/22/2023   BILITOT 2.0 (H) 05/16/2023   ALKPHOS 85 05/16/2023   AST 35 05/16/2023   ALT 40 05/16/2023   ANIONGAP 14 05/22/2023    CBG (last 3)  Recent Labs    05/21/23 1649 05/21/23 2203 05/22/23 0717  GLUCAP 167* 179* 292*      Coagulation Profile: Recent Labs  Lab 05/16/23 0335 05/17/23 0843 05/18/23 0540 05/19/23 0448  INR 1.8* 1.6* 1.5* 1.5*     Radiology Studies: I have personally reviewed the imaging studies  No results found.      Thad Ranger M.D. [facility] 05/22/2023, 10:30 AM  Available via Epic secure chat 7am-7pm After 7 pm, please refer to night coverage provider listed on amion.

## 2023-05-23 ENCOUNTER — Telehealth (HOSPITAL_COMMUNITY): Payer: Self-pay | Admitting: Pharmacy Technician

## 2023-05-23 ENCOUNTER — Other Ambulatory Visit (HOSPITAL_COMMUNITY): Payer: Self-pay

## 2023-05-23 DIAGNOSIS — J189 Pneumonia, unspecified organism: Secondary | ICD-10-CM | POA: Diagnosis not present

## 2023-05-23 DIAGNOSIS — R7989 Other specified abnormal findings of blood chemistry: Secondary | ICD-10-CM | POA: Diagnosis not present

## 2023-05-23 DIAGNOSIS — I5041 Acute combined systolic (congestive) and diastolic (congestive) heart failure: Secondary | ICD-10-CM | POA: Diagnosis not present

## 2023-05-23 DIAGNOSIS — I509 Heart failure, unspecified: Secondary | ICD-10-CM | POA: Diagnosis not present

## 2023-05-23 DIAGNOSIS — N179 Acute kidney failure, unspecified: Secondary | ICD-10-CM | POA: Diagnosis not present

## 2023-05-23 LAB — CBC
HCT: 31.8 % — ABNORMAL LOW (ref 39.0–52.0)
Hemoglobin: 10.1 g/dL — ABNORMAL LOW (ref 13.0–17.0)
MCH: 26.3 pg (ref 26.0–34.0)
MCHC: 31.8 g/dL (ref 30.0–36.0)
MCV: 82.8 fL (ref 80.0–100.0)
Platelets: 315 10*3/uL (ref 150–400)
RBC: 3.84 MIL/uL — ABNORMAL LOW (ref 4.22–5.81)
RDW: 17 % — ABNORMAL HIGH (ref 11.5–15.5)
WBC: 11.7 10*3/uL — ABNORMAL HIGH (ref 4.0–10.5)
nRBC: 0 % (ref 0.0–0.2)

## 2023-05-23 LAB — RENAL FUNCTION PANEL
Albumin: 2.2 g/dL — ABNORMAL LOW (ref 3.5–5.0)
Anion gap: 15 (ref 5–15)
BUN: 124 mg/dL — ABNORMAL HIGH (ref 6–20)
CO2: 24 mmol/L (ref 22–32)
Calcium: 8.7 mg/dL — ABNORMAL LOW (ref 8.9–10.3)
Chloride: 94 mmol/L — ABNORMAL LOW (ref 98–111)
Creatinine, Ser: 6.03 mg/dL — ABNORMAL HIGH (ref 0.61–1.24)
GFR, Estimated: 10 mL/min — ABNORMAL LOW (ref >60)
Glucose, Bld: 324 mg/dL — ABNORMAL HIGH (ref 70–99)
Phosphorus: 5.8 mg/dL — ABNORMAL HIGH (ref 2.5–4.6)
Potassium: 5.5 mmol/L — ABNORMAL HIGH (ref 3.5–5.1)
Sodium: 133 mmol/L — ABNORMAL LOW (ref 135–145)

## 2023-05-23 LAB — GLUCOSE, CAPILLARY
Glucose-Capillary: 212 mg/dL — ABNORMAL HIGH (ref 70–99)
Glucose-Capillary: 380 mg/dL — ABNORMAL HIGH (ref 70–99)
Glucose-Capillary: 167 mg/dL — ABNORMAL HIGH (ref 70–99)
Glucose-Capillary: 141 mg/dL — ABNORMAL HIGH (ref 70–99)
Glucose-Capillary: 200 mg/dL — ABNORMAL HIGH (ref 70–99)

## 2023-05-23 LAB — MAGNESIUM: Magnesium: 2.2 mg/dL (ref 1.7–2.4)

## 2023-05-23 MED ORDER — INSULIN GLARGINE-YFGN 100 UNIT/ML ~~LOC~~ SOLN
30.0000 [IU] | Freq: Two times a day (BID) | SUBCUTANEOUS | Status: DC
  Administered 2023-05-23 – 2023-05-24 (×2): 30 [IU] via SUBCUTANEOUS
  Filled 2023-05-23 (×3): qty 0.3

## 2023-05-23 MED ORDER — INSULIN ASPART 100 UNIT/ML IJ SOLN
10.0000 [IU] | Freq: Three times a day (TID) | INTRAMUSCULAR | Status: DC
  Administered 2023-05-23 – 2023-05-26 (×9): 10 [IU] via SUBCUTANEOUS

## 2023-05-23 NOTE — Procedures (Signed)
 Patient ID: Dylan Owen, male   DOB: 1965/11/14, 58 y.o.   MRN: [id number] [facility] Progress Note   Assessment/ Plan:   1. Acute kidney Injury: With essentially normal renal function at baseline (creatinine 1.1) and suffered ATN in the setting of shock.  CRRT early on.  Creatinine now greatly improving.  BUN still rising so plan for dialysis today but likely in a state of recovery - s/p [facility] 2/13  - HD 2/17 -Continue to monitor for recovery  2.  Mixed cardiogenic/septic shock: Clinically suspected to be associated with viral myocarditis/acute CHF exacerbation.  Urine output improving.  Continue to monitor for recovery.  Lasix if needed 3.  Shock liver: Transaminases appear to be trending down with hemodynamic support.   4.  Acute hypoxic/hypercarbic respiratory failure: Now improved with ultrafiltration 5. Anemia-hemoglobin 10.1.  Continue to monitor 6.  Uncontrolled type 2 diabetes with hyperglycemia.  Management per primary team 7.  Hyperkalemia: Dialysis as above  Subjective:    Patient states he feels well today.  Creatinine still rising but great urine output of 2.5 L made yesterday.   Objective:   BP 126/77   Pulse (!) 103   Temp 98.3 F (36.8 C)   Resp 18   Ht 6\' 1"  (1.854 m)   Wt (!) 136.4 kg   SpO2 99%   BMI 39.67 kg/m   Intake/Output Summary (Last 24 hours) at 05/23/2023 1016 Last data filed at 05/23/2023 0600 Gross per 24 hour  Intake 1320 ml  Output 2550 ml  Net -1230 ml   Weight change:   Physical Exam: Gen: Lying in bed, no distress CVS: Tachycardia, no rub Resp: Bilateral chest rise with no increased work of breathing Abd: Soft, obese, nontender, bowel sounds normal Ext: 1+ edema in the bilateral lower extremities, warm and well-perfused  Imaging: No results found.    Labs: BMET Recent Labs  Lab 05/17/23 0428 05/18/23 0540 05/19/23 0448 05/20/23 0432 05/21/23 0458 05/22/23 0316 05/23/23 0316  NA 134* 130* 132* 128*  130* 131* 133*  K 4.2 4.4 4.8 5.5* 5.1 5.1 5.5*  CL 98 95* 95* 93* 92* 93* 94*  CO2 23 22 22  21* 25 24 24   GLUCOSE 202* 313* 241* 351* 146* 205* 324*  BUN 54* 84* 105* 118* 75* 98* 124*  CREATININE 4.66* 6.31* 6.74* 6.96* 4.99* 5.55* 6.03*  CALCIUM 8.1* 8.1* 8.5* 8.7* 8.5* 8.9 8.7*  PHOS 3.7 4.2 4.3 4.6 4.5 5.1* 5.8*   CBC Recent Labs  Lab 05/20/23 0432 05/21/23 0634 05/22/23 0316 05/23/23 0316  WBC 14.9* 12.1* 11.7* 11.7*  HGB 12.4* 11.0* 10.3* 10.1*  HCT 37.8* 34.6* 31.9* 31.8*  MCV 80.3 82.2 82.0 82.8  PLT 334 308 299 315    Medications:     amiodarone  200 mg Oral BID   apixaban  5 mg Oral BID   bacitracin   Topical BID   Chlorhexidine Gluconate Cloth  6 each Topical Daily   famotidine  20 mg Oral Daily   feeding supplement  237 mL Oral BID BM   insulin aspart  0-15 Units Subcutaneous TID WC   insulin aspart  8 Units Subcutaneous TID WC   insulin glargine-yfgn  24 Units Subcutaneous BID   lidocaine  1 patch Transdermal Q24H   methocarbamol  500 mg Oral BID   midodrine  5 mg Oral TID WC   multivitamin  1 tablet Oral QHS   nicotine  14 mg Transdermal Daily   oxyCODONE  10 mg Oral Q6H    Darnell Level  05/23/2023, 10:16 AM

## 2023-05-23 NOTE — Progress Notes (Addendum)
 [facility]                                                                              Dylan Owen, is a 58 y.o. male, DOB - 1965-10-28, ZOX:[id number] Admit date - 05/04/2023    Outpatient Primary MD for the patient is McGowen, Maryjean Morn, MD  LOS - 18  days  Chief Complaint  Patient presents with   Shortness of Breath       Brief summary   Patient is a 58 year old male with obesity, HTN, HLP, diabetes mellitus type 2, OSA, chronic back pain, depression, GERD was admitted on 05/04/2023 with cough, sore throat, dizziness, myalgias for a week.  Patient had also reported chest tightness, headaches, chills, diaphoresis with dyspnea on exertion.  BNP was 1275.  Respiratory virus panel negative.  Patient was placed on Lasix.  Troponins mildly elevated, diagnosis of viral myocarditis was made. 2D echo showed EF ~25% although poor acoustic windows, cardiology was consulted. As of 05/06/2023, creatinine started trending up, was transferred to intensive care unit.  He was placed on BiPAP, milrinone.  Patient was intubated on 2/3 and was extubated on 2/5.  Also started on CRRT, currently on intermittent hemodialysis. Patient was transferred out to the floor and TRH assumed care on 05/21/2023  Significant Hospital Events:   1/29 Admit to [facility], diuresis 1/30 ECHO w LVEF 25% 1/31 Cards consult, c/f viral myocarditis  2/1 PCCM consult txf to [facility], Adv HF consult  2/2 For HD cath placement and CRRT. Incr D10 for persistent hypogly   2/3 Milrinone stopped. Weaning NE; intubated due to being on bipap for a few days and needing to go to CT  2/5 Extubated. Cleviprex for hypertension   2/6 CT Head for anisocoria, no acute findings. Off clevi. Adding PRN metop. CRRT UF decr to 150/hr.  2/7 On BiPAP. RHC and DCCV  2/8 Off crrt  2/9 Albumin + lasix challenge. HIT neg   2/10: got iHD 2/12: HD cath pulled by pt overnight. Ordered IR for TDC. Holding bivalirudin  2/13: did not get TDC  yesterday because WBC 14.  TDC on 2/13.   2/14: TDC on 2/13, underwent iHD, out of ICU after dialysis.    Assessment & Plan    Sepsis secondary to ? UTI, multifocal MSSA PNA, POA septic shock-resolved -Presented febrile, TEE showed moderately reduced LVEF.  Initial concern for cardiogenic shock with HFrEF, possible viral myocarditis, sequelae of multisystem organ dysfunction.  Per PCCM, likely septic shock, was placed on broad-spectrum antibiotics with meropenem and improved significantly. -Patient had required vasopressors in ICU, currently off -Respiratory virus panel negative, blood cultures 1/31 negative, urine culture showed multiple species.  -Tracheal aspirate showed rare Candida and Staph aureus/MSSA -Patient has completed 7 days of IV meropenem on 05/14/2023 -Sepsis physiology resolved -Remove Foley catheter for voiding trial    Acute hypoxic and hypercarbic resp failure, resolving MSSA PNA -Secondary to PNA, acute systolic heart failure, was intubated on 2/3, extubated on 2/5.  Pulmonology recommended O2, BiPAP PRN + at bedtime - Pulmonary hygiene, encourage OOB/mobility -Completed IV antibiotics -This morning on 4 L O2 via [place], wean  as tolerated.  Patient has been refusing BiPAP overnight  History of obstructive sleep apnea -Was recommended BiPAP at bedtime and O2 via [place] during the day -Outpatient sleep study recommended -Has been refusing BiPAP overnight   New Afib/flutter s/p DCCV 2/7 -- now sinus rhythm Acute systolic HFrEF-- septic cardiomyopathy  pHTN (groud II and III) -Initial concern for viral myocarditis -Initial echo 1/30: Unable to estimate EF due to poor quality - Repeat Echo 2/6 showed EF 40-45%.  - RHC with normal R and L filling pressures, elevated PA mean and PVR. - Initially placed on IV amiodarone drip, transitioned to p.o. amiodarone  -Volume management with intermittent HD -Currently on midodrine -On eliquis.  Has been in NSR/ST, requested cardiology  to follow-up (last seen by cardiology on 2/14) if he still needs to continue eliquis?  Epistaxis -Patient has been having intermittent episodes of epistaxis.  1 episode of hematuria on 2/15, resolved spontaneously -Aspirin was discontinued on 2/16, still on Eliquis (was held on 2/16) -Patient has been NSR/ST, requested cardiology to follow-up    Acute kidney injury likely ATN AGMA, resolved  -Nephrology following, essentially normal renal function at baseline, 1.1, worsened due to ATN in the setting of shock. -Placed on CRRT on 2/6.  Status post [facility] on 2/13. -Nephrology following -Currently on intermittent HD, hoping for renal recovery  Diabetes mellitus type 2, uncontrolled with hyperglycemia -Hemoglobin A1c 10.2 on 05/05/2023 CBG (last 3)  Recent Labs    05/22/23 2243 05/23/23 0753 05/23/23 1146  GLUCAP 212* 380* 167*   -CBGs still high, increase Semglee to 30 units twice daily, increase to NovoLog to 10 units 3 times daily AC, continue moderate SSI    Obesity class II Estimated body mass index is 39.67 kg/m as calculated from the following:   Height as of this encounter: 6\' 1"  (1.854 m).   Weight as of this encounter: 136.4 kg.  Code Status: Full CODE STATUS DVT Prophylaxis:  Place TED hose Start: 05/17/23 1318 apixaban (ELIQUIS) tablet 5 mg   Level of Care: Level of care: Med-Surg Family Communication: Updated patient Disposition Plan:      Remains inpatient appropriate: CIR once medically ready   Procedures:  Intubation, extubation 2D echo Cardiac cath [facility] placement Hemodialysis  Consultants:   Cardiology, nephrology, CCM  Antimicrobials:   Anti-infectives (From admission, onward)    Start     Dose/Rate Route Frequency Ordered Stop   05/19/23 1505  ceFAZolin (ANCEF) IVPB 2g/100 mL premix        over 30 Minutes Intravenous Continuous PRN 05/19/23 1519 05/19/23 1505   05/12/23 2200  meropenem (MERREM) 1 g in sodium chloride 0.9 % 100 mL IVPB        1  g 200 mL/hr over 30 Minutes Intravenous Every 12 hours 05/12/23 0755 05/14/23 2148   05/10/23 0000  vancomycin (VANCOREADY) IVPB 1500 mg/300 mL  Status:  Discontinued        1,500 mg 150 mL/hr over 120 Minutes Intravenous Every 24 hours 05/09/23 0945 05/10/23 0808   05/09/23 1045  vancomycin (VANCOREADY) IVPB 2000 mg/400 mL        2,000 mg 200 mL/hr over 120 Minutes Intravenous  Once 05/09/23 0945 05/09/23 1222   05/08/23 1600  meropenem (MERREM) 1 g in sodium chloride 0.9 % 100 mL IVPB  Status:  Discontinued        1 g 200 mL/hr over 30 Minutes Intravenous Every 8 hours 05/08/23 1505 05/12/23 0755   05/08/23 1500  piperacillin-tazobactam (  ZOSYN) IVPB 3.375 g  Status:  Discontinued        3.375 g 100 mL/hr over 30 Minutes Intravenous Every 6 hours 05/08/23 0833 05/08/23 1444   05/07/23 1400  piperacillin-tazobactam (ZOSYN) IVPB 3.375 g  Status:  Discontinued        3.375 g 12.5 mL/hr over 240 Minutes Intravenous Every 8 hours 05/07/23 0836 05/08/23 0832   05/07/23 1200  ceFEPIme (MAXIPIME) 2 g in sodium chloride 0.9 % 100 mL IVPB  Status:  Discontinued        2 g 200 mL/hr over 30 Minutes Intravenous Every 12 hours 05/06/23 2346 05/07/23 0827   05/07/23 0930  piperacillin-tazobactam (ZOSYN) IVPB 3.375 g        3.375 g 100 mL/hr over 30 Minutes Intravenous  Once 05/07/23 0836 05/07/23 1200   05/07/23 0030  ceFEPIme (MAXIPIME) 2 g in sodium chloride 0.9 % 100 mL IVPB        2 g 200 mL/hr over 30 Minutes Intravenous  Once 05/06/23 2339 05/07/23 0100   05/07/23 0030  metroNIDAZOLE (FLAGYL) IVPB 500 mg  Status:  Discontinued        500 mg 100 mL/hr over 60 Minutes Intravenous Every 12 hours 05/06/23 2339 05/07/23 0827          Medications  amiodarone  200 mg Oral BID   apixaban  5 mg Oral BID   bacitracin   Topical BID   Chlorhexidine Gluconate Cloth  6 each Topical Daily   famotidine  20 mg Oral Daily   feeding supplement  237 mL Oral BID BM   insulin aspart  0-15 Units  Subcutaneous TID WC   insulin aspart  8 Units Subcutaneous TID WC   insulin glargine-yfgn  24 Units Subcutaneous BID   lidocaine  1 patch Transdermal Q24H   methocarbamol  500 mg Oral BID   midodrine  5 mg Oral TID WC   multivitamin  1 tablet Oral QHS   nicotine  14 mg Transdermal Daily   oxyCODONE  10 mg Oral Q6H      Subjective:   Dylan Owen was seen and examined today.  No further hematuria however had epistaxis this morning, resolved quickly.  On 4 L O2 BNC, humidified.  No chest pain, acute shortness of breath, fevers or chills.  Tolerating breakfast without difficulty.   Objective:   Vitals:   05/22/23 2007 05/23/23 0500 05/23/23 0546 05/23/23 0838  BP: (!) 115/47  121/63 126/77  Pulse: (!) 103  (!) 105 (!) 103  Resp:   18 18  Temp: 98.1 F (36.7 C)  98.1 F (36.7 C) 98.3 F (36.8 C)  TempSrc: Oral  Oral   SpO2: 96%  96% 99%  Weight:  (!) 136.4 kg    Height:        Intake/Output Summary (Last 24 hours) at 05/23/2023 1201 Last data filed at 05/23/2023 0600 Gross per 24 hour  Intake 1320 ml  Output 2550 ml  Net -1230 ml     Wt Readings from Last 3 Encounters:  05/23/23 (!) 136.4 kg  05/04/23 (!) 148.8 kg  12/09/22 (!) 153.3 kg   Physical Exam General: Alert and oriented x 3, NAD Cardiovascular: S1 S2 clear, RRR.  Respiratory: Diminished breath sound at the bases Gastrointestinal: Soft, nontender, nondistended, NBS Ext: 1+ pedal edema bilaterally Neuro: no new deficits Psych: Normal affect     Data Reviewed:  I have personally reviewed following labs    CBC Lab Results  Component Value Date   WBC 11.7 (H) 05/23/2023   RBC 3.84 (L) 05/23/2023   HGB 10.1 (L) 05/23/2023   HCT 31.8 (L) 05/23/2023   MCV 82.8 05/23/2023   MCH 26.3 05/23/2023   PLT 315 05/23/2023   MCHC 31.8 05/23/2023   RDW 17.0 (H) 05/23/2023   LYMPHSABS 0.7 05/06/2023   MONOABS 2.3 (H) 05/06/2023   EOSABS 0.0 05/06/2023   BASOSABS 0.1 05/06/2023     Last metabolic  panel Lab Results  Component Value Date   NA 133 (L) 05/23/2023   K 5.5 (H) 05/23/2023   CL 94 (L) 05/23/2023   CO2 24 05/23/2023   BUN 124 (H) 05/23/2023   CREATININE 6.03 (H) 05/23/2023   GLUCOSE 324 (H) 05/23/2023   GFRNONAA 10 (L) 05/23/2023   GFRAA >90 10/09/2011   CALCIUM 8.7 (L) 05/23/2023   PHOS 5.8 (H) 05/23/2023   PROT 5.9 (L) 05/16/2023   ALBUMIN 2.2 (L) 05/23/2023   BILITOT 2.0 (H) 05/16/2023   ALKPHOS 85 05/16/2023   AST 35 05/16/2023   ALT 40 05/16/2023   ANIONGAP 15 05/23/2023    CBG (last 3)  Recent Labs    05/22/23 2243 05/23/23 0753 05/23/23 1146  GLUCAP 212* 380* 167*      Coagulation Profile: Recent Labs  Lab 05/17/23 0843 05/18/23 0540 05/19/23 0448  INR 1.6* 1.5* 1.5*     Radiology Studies: I have personally reviewed the imaging studies  No results found.      Thad Ranger M.D. [facility] 05/23/2023, 12:01 PM  Available via Epic secure chat 7am-7pm After 7 pm, please refer to night coverage provider listed on amion.

## 2023-05-23 NOTE — TOC Progression Note (Signed)
 Transition of Care [facility]) - Progression Note    Patient Details  Name: Dylan Owen MRN: [id number] Date of Birth: 10/25/1965  [facility] of [facility]) CM/SW Contact  Elliot Cousin, RN Phone Number: [phone] 05/23/2023, 9:51 AM  Clinical Narrative:    CIR is following pt for IP rehab. Will notify [employer] rep, Kandee Keen to follow after dc for IP rehab.    HF TOC CM/CSW signed off. Unit TOC CM updated.    Expected Discharge Plan: Home w Home Health Services Barriers to Discharge: Continued Medical Work up  Expected Discharge Plan and Services   Discharge Planning Services: CM Consult Post Acute Care Choice: Home Health Living arrangements for the past 2 months: Single Family Home                           HH Arranged: RN, PT [facility] Agency: [facility] Date [facility] Agency Contacted: 05/16/23 Time HH Agency Contacted: 1555 Representative spoke with at [facility] Agency: Lorenza Chick   Social Determinants of Health (SDOH) Interventions SDOH Screenings   Food Insecurity: No Food Insecurity (05/05/2023)  Recent Concern: Food Insecurity - Food Insecurity Present (05/05/2023)  Housing: Low Risk  (05/05/2023)  Transportation Needs: No Transportation Needs (05/05/2023)  Utilities: Not At Risk (05/05/2023)  Alcohol Screen: Low Risk  (11/05/2020)  Depression (PHQ2-9): High Risk (11/26/2022)  Financial Resource Strain: High Risk (12/05/2022)  Physical Activity: Unknown (12/05/2022)  Social Connections: Moderately Isolated (05/05/2023)  Stress: Stress Concern Present (12/05/2022)  Tobacco Use: High Risk (05/05/2023)    Readmission Risk Interventions     No data to display

## 2023-05-23 NOTE — Telephone Encounter (Signed)
 Patient Product/process development scientist completed.    The patient is insured through [employer]/ [employer]. Patient has Medicare and is not eligible for a copay card, but may be able to apply for patient assistance or Medicare RX Payment Plan (Patient Must reach out to their plan, if eligible for payment plan), if available.    Ran test claim for Eliquis 5 mg and the current 30 day co-pay is $12.15.   This test claim was processed through [facility]- copay amounts may vary at other pharmacies due to pharmacy/plan contracts, or as the patient moves through the different stages of their insurance plan.     Roland Earl, CPHT Pharmacy Technician III Certified Patient Advocate [facility] Patient Advocate Team Direct Number: [phone]  Fax: [phone]

## 2023-05-23 NOTE — Discharge Instructions (Signed)

## 2023-05-23 NOTE — Progress Notes (Signed)
 Physical Therapy Treatment Patient Details Name: Dylan Owen MRN: [id number] DOB: Aug 24, 1965 Today's Date: 05/23/2023   History of Present Illness Pt is 58 yo presenting to [facility] as transfer from [facility] 2/1 due to mixed septic and cardiogenic shock. Pt presented to [facility] hospital due to referral from MD on 05/05/23. Pt started on CRRT on 2/2-2/8, transitioned to HD. Intubated on 2/3 due to constant BiPAP use and unable to wean. Intubation was discontinued on 2/5. On BIPAP on 2/7 then off BiPAP. CRRT discontinued on 2/8.  Had intermittent HD at bedside this am.  PMH: HTN, hyperlipidemia, DM II, sleep apnea, chronic back pain.    PT Comments  Continuing work on functional mobility and activity tolerance;  Session focused on progressive amb, with good progress; Opted to use a chair follow to boost pt's confidence to be able to go further (and have the chair at anytime he needed to sit); Walked into the hallway, approx 60 ft total; Motivated to progress back to independence; Patient will benefit from intensive inpatient follow-up therapy, >3 hours/day     If plan is discharge home, recommend the following: A lot of help with walking and/or transfers;Assistance with cooking/housework;Assist for transportation   Can travel by private vehicle        Equipment Recommendations  BSC/3in1;Rollator (4 wheels)    Recommendations for Other Services Rehab consult     Precautions / Restrictions Precautions Precautions: Fall Precaution/Restrictions Comments: watch SpO2/HR     Mobility  Bed Mobility Overal bed mobility: Needs Assistance Bed Mobility: Supine to Sit Rolling: Contact guard assist         General bed mobility comments: Used rails; close guard for safety, but no physical assist needed    Transfers Overall transfer level: Needs assistance Equipment used: Rolling walker (2 wheels) Transfers: Sit to/from Stand Sit to Stand: +2 safety/equipment, Mod assist           General  transfer comment: Light mod assis tt rise    Ambulation/Gait Ambulation/Gait assistance: Min assist, +2 physical assistance, +2 safety/equipment Gait Distance (Feet): 70 Feet Assistive device: Rolling walker (2 wheels) Gait Pattern/deviations: Step-through pattern, Decreased stride length, Wide base of support       General Gait Details: Cues for posture and RW proximity; opted to walk with pt suported on supplemental O2 3 L; noted DOE   Optometrist     Tilt Bed    Modified Rankin (Stroke Patients Only)       Balance     Sitting balance-Leahy Scale: Good       Standing balance-Leahy Scale: Poor                              Communication Communication Communication: No apparent difficulties  Cognition Arousal: Alert Behavior During Therapy: WFL for tasks assessed/performed   PT - Cognitive impairments: Memory, Safety/Judgement                         Following commands: Intact      Cueing Cueing Techniques: Verbal cues, Tactile cues  Exercises      General Comments General comments (skin integrity, edema, etc.): Opted to sit down as DOE began      Pertinent Vitals/Pain Pain Assessment Pain Assessment: No/denies pain Pain Intervention(s): Monitored during session    Home Living  Prior Function            PT Goals (current goals can now be found in the care plan section) Acute Rehab PT Goals Patient Stated Goal: to improve mobility PT Goal Formulation: With patient Time For Goal Achievement: 05/25/23 Potential to Achieve Goals: Good Progress towards PT goals: Progressing toward goals (May need goal update next session)    Frequency    Min 1X/week      PT Plan      Co-evaluation              AM-PAC PT "6 Clicks" Mobility   Outcome Measure  Help needed turning from your back to your side while in a flat bed without using bedrails?: A  Little Help needed moving from lying on your back to sitting on the side of a flat bed without using bedrails?: A Lot Help needed moving to and from a bed to a chair (including a wheelchair)?: A Lot Help needed standing up from a chair using your arms (e.g., wheelchair or bedside chair)?: A Lot Help needed to walk in hospital room?: A Lot Help needed climbing 3-5 steps with a railing? : Total 6 Click Score: 12    End of Session Equipment Utilized During Treatment: Gait belt;Oxygen Activity Tolerance: Patient tolerated treatment well Patient left: with call bell/phone within reach;in chair Nurse Communication: Mobility status PT Visit Diagnosis: Unsteadiness on feet (R26.81);Other abnormalities of gait and mobility (R26.89);Muscle weakness (generalized) (M62.81)     Time: 1610-9604 PT Time Calculation (min) (ACUTE ONLY): 25 min  Charges:    $Gait Training: 23-37 mins PT General Charges $$ ACUTE PT VISIT: 1 Visit                     Van Clines, PT  Acute Rehabilitation Services Office [phone] Secure Chat welcomed    [name] 05/23/2023, 4:55 PM

## 2023-05-23 NOTE — Progress Notes (Signed)
   05/23/23 1735  Vitals  Temp 98 F (36.7 C)  Pulse Rate (!) 107  Resp (!) 24  BP (!) 102/49  SpO2 94 %  O2 Device Room Air  Post Treatment  Dialyzer Clearance Lightly streaked  Hemodialysis Intake (mL) 0 mL  Liters Processed 63  Fluid Removed (mL) 0 mL  Tolerated HD Treatment Yes   Received patient in bed to unit.  Alert and oriented.  Informed consent signed and in chart.   TX duration:3hrs  Patient tolerated well.  Transported back to the room  Alert, without acute distress.  Hand-off given to patient's nurse.   Access used: [facility] Access issues: none  Total UF removed: 0 Medication(s) given: none    Dylan Owen [facility]

## 2023-05-23 NOTE — Progress Notes (Signed)
 At approximately 0600, patient had a nose bleed with several small clots according to the patient.  Per patient, the nose bleed resolved fairly quickly.  Advised patient to let nursing know the next time one starts.  Will continue to monitor patient.  Bernie Covey RN

## 2023-05-23 NOTE — Care Management Important Message (Signed)
 Important Message  Patient Details  Name: Dylan Owen MRN: [id number] Date of Birth: 1965-06-03   Important Message Given:  Yes - Medicare IM     Dorena Bodo 05/23/2023, 3:22 PM

## 2023-05-23 NOTE — Inpatient Diabetes Management (Signed)
 Inpatient Diabetes Program Recommendations  AACE/ADA: New Consensus Statement on Inpatient Glycemic Control (2015)  Target Ranges:  Prepandial:   less than 140 mg/dL      Peak postprandial:   less than 180 mg/dL (1-2 hours)      Critically ill patients:  140 - 180 mg/dL    Latest Reference Range & Units 05/22/23 07:17 05/22/23 11:37 05/22/23 16:39 05/22/23 22:43  Glucose-Capillary 70 - 99 mg/dL 213 (H)  17 units Novolog  20 units Semglee 229 (H)  13 units Novolog  136 (H)  10 units Novolog @0805  212 (H)     24 units Semglee  (H): Data is abnormally high  Latest Reference Range & Units 05/23/23 07:53  Glucose-Capillary 70 - 99 mg/dL 086 (H)  23 units Novolog  24 units Semglee  (H): Data is abnormally high     Home DM Meds: Tresiba 80 units q AM and 72 units q PM Novolog 3 units tid with meals Freestyle Libre 3  Current Orders: Semglee 24 units BID Novolog Moderate Correction Scale/ SSI (0-15 units) TID AC Novolog 8 units TID with meals     Note Semglee and Novolog MeaL Coverage doses both increased yesterday CBG remains elevated this AM  MD- Please consider:  1. Increase Semglee to 30 units BID  2. Increase Novolog Meal Coverage to 10 units TID with meals     --Will follow patient during hospitalization--  Ambrose Finland RN, MSN, CDCES Diabetes Coordinator Inpatient Glycemic Control Team Team Pager: [phone] (8a-5p)

## 2023-05-23 NOTE — Progress Notes (Signed)
 Inpatient Rehab Admissions Coordinator:    CIR following for potential medical readiness. Note some renal recovery. Will follow  for potential admit and seek insurance auth once stable. Megan Salon, MS, CCC-SLP Rehab Admissions Coordinator  [phone] (celll) [phone] (office)

## 2023-05-23 NOTE — Progress Notes (Signed)
 Rounding Note    Patient Name: Dylan Owen Date of Encounter: 05/23/2023  [facility] Cardiologist: Rennis Golden / Elwyn Lade    Subjective   58 yo with hx of obesity, HTN  HLD, DM. OSA  Admitted with mixed septic and cardiogenic shock Aneuria renal failure requiring CRRT   Was treated with abx. Made significant improvement   Echocardiogram reveals mildly reduced LVEF of 40 to 45%. Mild tricuspid regurgitation, trivial mitral regurgitation  Right heart catheterization revealed RA pressure of 6.  PA pressure of 39/26 with a mean of 33.  Pulmonary capillary wedge pressure was 10. Fick cardiac output was 5.78 L/min with an index of 2.3. He was found to have group II ( secondary to LV dysfunction)  + group III ( secondary to underlying lung disease) pulmonary HTN  He was cardioverted on Feb. 7   Has been on midodrine for hypotension for hemodialysis    He complains of having some occasional nosebleeding.  He is on Eliquis for his paroxysmal atrial fibrillation.  He has been on high flow nasal cannula oxygen.  He has not had any nosebleeds since this morning.  I think we can get by with conservative treatment for his nosebleeds and still continue the Eliquis.  He is making a fair amount of urine.  His Foley was discontinued today.    Inpatient Medications    Scheduled Meds:  amiodarone  200 mg Oral BID   apixaban  5 mg Oral BID   bacitracin   Topical BID   Chlorhexidine Gluconate Cloth  6 each Topical Daily   famotidine  20 mg Oral Daily   feeding supplement  237 mL Oral BID BM   insulin aspart  0-15 Units Subcutaneous TID WC   insulin aspart  10 Units Subcutaneous TID WC   insulin glargine-yfgn  30 Units Subcutaneous BID   lidocaine  1 patch Transdermal Q24H   methocarbamol  500 mg Oral BID   midodrine  5 mg Oral TID WC   multivitamin  1 tablet Oral QHS   nicotine  14 mg Transdermal Daily   oxyCODONE  10 mg Oral Q6H   Continuous Infusions:  anticoagulant  sodium citrate     PRN Meds: acetaminophen (TYLENOL) oral liquid 160 mg/5 mL, albuterol, alteplase, anticoagulant sodium citrate, heparin, HYDROmorphone (DILAUDID) injection, naLOXone (NARCAN)  injection, nitroGLYCERIN, ondansetron (ZOFRAN) IV, mouth rinse, sodium chloride   Vital Signs    Vitals:   05/22/23 2007 05/23/23 0500 05/23/23 0546 05/23/23 0838  BP: (!) 115/47  121/63 126/77  Pulse: (!) 103  (!) 105 (!) 103  Resp:   18 18  Temp: 98.1 F (36.7 C)  98.1 F (36.7 C) 98.3 F (36.8 C)  TempSrc: Oral  Oral   SpO2: 96%  96% 99%  Weight:  (!) 136.4 kg    Height:        Intake/Output Summary (Last 24 hours) at 05/23/2023 1241 Last data filed at 05/23/2023 0600 Gross per 24 hour  Intake 1320 ml  Output 2550 ml  Net -1230 ml      05/23/2023    5:00 AM 05/21/2023    5:55 AM 05/20/2023   11:44 AM  Last 3 Weights  Weight (lbs) 300 lb 11.3 oz 292 lb 15.9 oz 287 lb 4.2 oz  Weight (kg) 136.4 kg 132.9 kg 130.3 kg      Telemetry    Sinus tachy at 104   - Personally Reviewed  ECG     -  Personally Reviewed  Physical Exam   GEN: morbidly obese make,  No acute distress.   Neck: No JVD Cardiac: RRR, no murmurs, rubs, or gallops.  Respiratory: Clear to auscultation bilaterally. GI: Soft, nontender, non-distended  MS: No edema; No deformity. Neuro:  Nonfocal  Psych: Normal affect   Labs    High Sensitivity Troponin:   Recent Labs  Lab 05/05/23 0230 05/05/23 0346 05/06/23 2037 05/07/23 0059  TROPONINIHS 30* 27* 38* 109*     Chemistry Recent Labs  Lab 05/21/23 0458 05/22/23 0316 05/23/23 0316  NA 130* 131* 133*  K 5.1 5.1 5.5*  CL 92* 93* 94*  CO2 25 24 24   GLUCOSE 146* 205* 324*  BUN 75* 98* 124*  CREATININE 4.99* 5.55* 6.03*  CALCIUM 8.5* 8.9 8.7*  MG 2.0 2.1 2.2  ALBUMIN 2.2* 2.2* 2.2*  GFRNONAA 13* 11* 10*  ANIONGAP 13 14 15     Lipids No results for input(s): "CHOL", "TRIG", "HDL", "LABVLDL", "LDLCALC", "CHOLHDL" in the last 168 hours.   Hematology Recent Labs  Lab 05/21/23 0634 05/22/23 0316 05/23/23 0316  WBC 12.1* 11.7* 11.7*  RBC 4.21* 3.89* 3.84*  HGB 11.0* 10.3* 10.1*  HCT 34.6* 31.9* 31.8*  MCV 82.2 82.0 82.8  MCH 26.1 26.5 26.3  MCHC 31.8 32.3 31.8  RDW 17.3* 17.2* 17.0*  PLT 308 299 315   Thyroid No results for input(s): "TSH", "FREET4" in the last 168 hours.  BNPNo results for input(s): "BNP", "PROBNP" in the last 168 hours.  DDimer No results for input(s): "DDIMER" in the last 168 hours.   Radiology    No results found.  Cardiac Studies     Patient Profile     58 y.o. male with septic shock , mild LV dysfunction , group II and group III pulmonary HTN   AKI - on HD    Assessment & Plan    1.  Acute combined systolic and diastolic congestive heart failure: Patient developed acute CHF in the setting of sepsis.  His LVEF is mildly reduced at 40 to 45%.  He has mild tricuspid regurg regurgitation and trivial mitral regurgitation.  He seems to be slowly making progress.  Heart catheterization revealed moderate pulmonary hypertension with group 2 and group 3 pulmonary hypertension.  He is starting to diurese fairly briskly.  He still on hemodialysis.  He is still on midodrine.  We are unable to add any GDMT for his CHF at this time.       For questions or updates, please contact [facility] Please consult [email or web address] for contact info under        Signed, Kristeen Miss, MD  05/23/2023, 12:41 PM

## 2023-05-24 DIAGNOSIS — I5041 Acute combined systolic (congestive) and diastolic (congestive) heart failure: Secondary | ICD-10-CM | POA: Diagnosis not present

## 2023-05-24 DIAGNOSIS — I4891 Unspecified atrial fibrillation: Secondary | ICD-10-CM

## 2023-05-24 DIAGNOSIS — R7989 Other specified abnormal findings of blood chemistry: Secondary | ICD-10-CM | POA: Diagnosis not present

## 2023-05-24 DIAGNOSIS — J9601 Acute respiratory failure with hypoxia: Secondary | ICD-10-CM | POA: Diagnosis not present

## 2023-05-24 DIAGNOSIS — I4892 Unspecified atrial flutter: Secondary | ICD-10-CM | POA: Diagnosis not present

## 2023-05-24 DIAGNOSIS — I509 Heart failure, unspecified: Secondary | ICD-10-CM | POA: Diagnosis not present

## 2023-05-24 LAB — RENAL FUNCTION PANEL
Albumin: 2.3 g/dL — ABNORMAL LOW (ref 3.5–5.0)
Anion gap: 12 (ref 5–15)
BUN: 77 mg/dL — ABNORMAL HIGH (ref 6–20)
CO2: 27 mmol/L (ref 22–32)
Calcium: 8.8 mg/dL — ABNORMAL LOW (ref 8.9–10.3)
Chloride: 94 mmol/L — ABNORMAL LOW (ref 98–111)
Creatinine, Ser: 4.13 mg/dL — ABNORMAL HIGH (ref 0.61–1.24)
GFR, Estimated: 16 mL/min — ABNORMAL LOW (ref >60)
Glucose, Bld: 333 mg/dL — ABNORMAL HIGH (ref 70–99)
Phosphorus: 4.2 mg/dL (ref 2.5–4.6)
Potassium: 5 mmol/L (ref 3.5–5.1)
Sodium: 133 mmol/L — ABNORMAL LOW (ref 135–145)

## 2023-05-24 LAB — GLUCOSE, CAPILLARY
Glucose-Capillary: 235 mg/dL — ABNORMAL HIGH (ref 70–99)
Glucose-Capillary: 220 mg/dL — ABNORMAL HIGH (ref 70–99)
Glucose-Capillary: 110 mg/dL — ABNORMAL HIGH (ref 70–99)
Glucose-Capillary: 167 mg/dL — ABNORMAL HIGH (ref 70–99)

## 2023-05-24 LAB — MAGNESIUM: Magnesium: 1.9 mg/dL (ref 1.7–2.4)

## 2023-05-24 MED ORDER — INSULIN GLARGINE-YFGN 100 UNIT/ML ~~LOC~~ SOLN
34.0000 [IU] | Freq: Two times a day (BID) | SUBCUTANEOUS | Status: DC
Start: 2023-05-24 — End: 2023-05-26
  Administered 2023-05-24 – 2023-05-26 (×4): 34 [IU] via SUBCUTANEOUS
  Filled 2023-05-24 (×5): qty 0.34

## 2023-05-24 MED ORDER — AMIODARONE HCL 200 MG PO TABS
200.0000 mg | ORAL_TABLET | Freq: Every day | ORAL | Status: DC
  Administered 2023-05-25 – 2023-05-29 (×5): 200 mg via ORAL
  Filled 2023-05-24 (×5): qty 1

## 2023-05-24 MED ORDER — GERHARDT'S BUTT CREAM
TOPICAL_CREAM | Freq: Three times a day (TID) | CUTANEOUS | Status: DC | PRN
  Administered 2023-05-24: 1 via TOPICAL
  Administered 2023-05-25: 2 via TOPICAL
  Filled 2023-05-24: qty 60

## 2023-05-24 NOTE — Progress Notes (Addendum)
 Nutrition Follow-up  DOCUMENTATION CODES:   Not applicable  INTERVENTION:  Ensure Enlive po BID, each supplement provides 350 kcal and 20 grams of protein Continue renal MVI daily QHS snack    NUTRITION DIAGNOSIS:  Increased nutrient needs related to chronic illness as evidenced by estimated needs. - new dx established as he is now on PO diet  GOAL:  Patient will meet greater than or equal to 90% of their needs  MONITOR:  PO intake, Supplement acceptance  REASON FOR ASSESSMENT:  Consult Assessment of nutrition requirement/status (CRRT)  ASSESSMENT:   58 yo male admitted with new HFrEF and developed cardiogenic and septic shock requiring transfer to ICU, vasopressors and progressive anuric AKI initiation of CRRT, +shock liver. PMH includes HTN, DM, obesity, OSA, B12 deficiency, +smokeless tobacco use  1/30 - admitted 1/31 - BiPAP, NPO 2/01 - transferred to ICU 2/02 - CRRT initiated 2/03 - intubated, trickle TF initiated, CT: multifocal pneumonia, mild diffuse body wall edema 2/04 - TF titrated towards goal, held overnight with 1L output from OG 2/05 - Cortrak placed (post-pyloric), extubated 2/06 - pt pulled out Cortrak, diet advanced to Heart Healthy/Carb Modified (later liberalized to Carb Modified) 2/07 - s/p RHC and DCCV 2/08 - off CRRT 2/10 - iHD 2/13 - TDC placed 2/14 -  transferred out of ICU 2/19 - holding dialysis  Appetite desirable averaging 91% x8 documented meals. Endorses no issues chewing or swallowing and no N/V/C. Some diarrhea reported. Likely sufficiently meeting needs with supplementation. Amicable to continuing. Will order high protein bedtime snack for blood sugar control.   Answered questions regarding previous diet education around T2DM. States he is "trying to do better" with food choices and observed a salad as his entree from lunch at bedside. Reiterated importance of protein, fiber, and physical activity to manage blood sugars. He verbalized  understanding.   Admit Weight: 148.8kg Current Weight: 136.4kg Lowest Weight: 122.8kg on 2/9  Patient continues with mild edema to BLEs. Significant amount of fluid removed during admission. iHD on hold starting tomorrow. UOP of 1L yesterday. Nephrology hoping for renal recovery.    Intake/Output Summary (Last 24 hours) at 05/24/2023 1710 Last data filed at 05/24/2023 0753 Gross per 24 hour  Intake 480 ml  Output 1000 ml  Net -520 ml   Net IO Since Admission: -17,117.74 mL [05/24/23 1710]   Meds: famotidine, SSI, semglee, renal MVI  Labs: 133 (L) K+ 5.5>5.0(wdl) CBGs 324-333 over 48 hours A1c 10.2 (04/2023)  Diet Order:   Diet Order             Diet heart healthy/carb modified Room service appropriate? Yes; Fluid consistency: Thin  Diet effective now             EDUCATION NEEDS:   Not appropriate for education at this time  Skin:  Skin Assessment: Skin Integrity Issues: Skin Integrity Issues:: Other (Comment) Other: device related pressure injury to bridge of nose and L cheek from BiPap mask  Last BM:  2/18  Height:  Ht Readings from Last 1 Encounters:  05/21/23 6\' 1"  (1.854 m)   Weight:  Wt Readings from Last 1 Encounters:  05/23/23 (!) 136.4 kg    Ideal Body Weight:  83.6 kg  BMI:  Body mass index is 39.67 kg/m.  Estimated Nutritional Needs:   Kcal:  2100-2400 kcals  Protein:  120-140 grams  Fluid:  UOP + 1000 mL  Myrtie Cruise MS, RD, LDN Registered Dietitian Clinical Nutrition RD Inpatient Contact Info in Amion

## 2023-05-24 NOTE — Plan of Care (Signed)
   Problem: Fluid Volume: Goal: Ability to maintain a balanced intake and output will improve Outcome: Progressing   Problem: Metabolic: Goal: Ability to maintain appropriate glucose levels will improve Outcome: Progressing   Problem: Nutritional: Goal: Maintenance of adequate nutrition will improve Outcome: Progressing   Problem: Skin Integrity: Goal: Risk for impaired skin integrity will decrease Outcome: Progressing

## 2023-05-24 NOTE — Progress Notes (Signed)
   05/24/23 1928  BiPAP/CPAP/SIPAP  BiPAP/CPAP/SIPAP Pt Type Adult  BiPAP/CPAP/SIPAP SERVO  BiPAP/CPAP /SiPAP Vitals  Pulse Rate (!) 102  SpO2 94 %    Pt has PRN BIPAP orders, no distress noted at this time

## 2023-05-24 NOTE — Progress Notes (Signed)
 Physical Therapy Treatment Patient Details Name: Dylan Owen MRN: [id number] DOB: 04-27-1965 Today's Date: 05/24/2023   History of Present Illness Pt is 58 yo presenting to [facility] as transfer from [facility] 2/1 due to mixed septic and cardiogenic shock. Pt presented to [facility] hospital due to referral from MD on 05/05/23. Pt started on CRRT on 2/2-2/8, transitioned to HD. Intubated on 2/3 due to constant BiPAP use and unable to wean. Intubation was discontinued on 2/5. On BIPAP on 2/7 then off BiPAP. CRRT discontinued on 2/8.  Had intermittent HD at bedside this am.  PMH: HTN, hyperlipidemia, DM II, sleep apnea, chronic back pain.    PT Comments  Continuing work on functional mobility and activity tolerance;  Session focused on functional transfers, and he is making solid progress; CGA assist to stand and take pivot steps recliner back to bed; Politely declining progressive amb, very sore bottom; asks PT to return later (unlikely we will have time);   On room air, O2 sats stable, 96% at end of session; anticipate will meet acute PT goals next session;   Tells me he would like more Diabetes Education; notified Dr. Isidoro Donning    If plan is discharge home, recommend the following: A lot of help with walking and/or transfers;Assistance with cooking/housework;Assist for transportation   Can travel by private vehicle        Equipment Recommendations  BSC/3in1;Rollator (4 wheels) Engineer, manufacturing)    Recommendations for Other Services       Precautions / Restrictions Precautions Precautions: Fall Precaution/Restrictions Comments: watch SpO2/HR Restrictions Weight Bearing Restrictions Per Provider Order: No     Mobility  Bed Mobility Overal bed mobility: Needs Assistance Bed Mobility: Sit to Supine Rolling: Min assist         General bed mobility comments: Min assist to help LEs into bed    Transfers Overall transfer level: Needs assistance Equipment used: Rolling walker (2 wheels) Transfers:  Sit to/from Stand, Bed to chair/wheelchair/BSC Sit to Stand: Contact guard assist           General transfer comment: Good push up from armrests of recliner; close guard for safety; opted to get back to bed today    Ambulation/Gait               General Gait Details: asking about getting barrier cream for buttocks, very uncomfortable; politely declining amb at this time   Stairs             Wheelchair Mobility     Tilt Bed    Modified Rankin (Stroke Patients Only)       Balance     Sitting balance-Leahy Scale: Good       Standing balance-Leahy Scale: Poor                              Communication Communication Communication: No apparent difficulties  Cognition Arousal: Alert Behavior During Therapy: WFL for tasks assessed/performed                             Following commands: Intact      Cueing Cueing Techniques: Verbal cues, Tactile cues  Exercises      General Comments General comments (skin integrity, edema, etc.): Notable DOE with sit to stand and pivot steps back to bed; session conducted on room air; O2 sats 96% end of session      Pertinent Vitals/Pain  Pain Assessment Pain Assessment: Faces Faces Pain Scale: Hurts little more Pain Location: bottom Pain Descriptors / Indicators: Sore Pain Intervention(s): Monitored during session    Home Living                          Prior Function            PT Goals (current goals can now be found in the care plan section) Acute Rehab PT Goals Patient Stated Goal: to improve mobility PT Goal Formulation: With patient Time For Goal Achievement: 05/25/23 Potential to Achieve Goals: Good Progress towards PT goals: Progressing toward goals (update goals next session)    Frequency    Min 1X/week      PT Plan      Co-evaluation              AM-PAC PT "6 Clicks" Mobility   Outcome Measure  Help needed turning from your back to your  side while in a flat bed without using bedrails?: A Little Help needed moving from lying on your back to sitting on the side of a flat bed without using bedrails?: A Little Help needed moving to and from a bed to a chair (including a wheelchair)?: A Little Help needed standing up from a chair using your arms (e.g., wheelchair or bedside chair)?: A Little Help needed to walk in hospital room?: A Little Help needed climbing 3-5 steps with a railing? : A Lot 6 Click Score: 17    End of Session Equipment Utilized During Treatment: Gait belt Activity Tolerance: Patient tolerated treatment well Patient left: in bed;with call bell/phone within reach Nurse Communication: Mobility status PT Visit Diagnosis: Unsteadiness on feet (R26.81);Other abnormalities of gait and mobility (R26.89);Muscle weakness (generalized) (M62.81)     Time: 2130-8657 PT Time Calculation (min) (ACUTE ONLY): 13 min  Charges:    $Therapeutic Activity: 8-22 mins PT General Charges $$ ACUTE PT VISIT: 1 Visit                     Van Clines, PT  Acute Rehabilitation Services Office [phone] Secure Chat welcomed'    [name] 05/24/2023, 4:28 PM

## 2023-05-24 NOTE — Progress Notes (Addendum)
 Rounding Note    Patient Name: Dylan Owen Date of Encounter: 05/24/2023  [facility] Cardiologist: Dr. Rennis Golden  Subjective   No acute overnight events. He is up eating a salad at this time. He denies any shortness of breath. No currently requiring any supplemental O2. No chest pain.   Inpatient Medications    Scheduled Meds:  amiodarone  200 mg Oral BID   apixaban  5 mg Oral BID   bacitracin   Topical BID   Chlorhexidine Gluconate Cloth  6 each Topical Daily   famotidine  20 mg Oral Daily   feeding supplement  237 mL Oral BID BM   insulin aspart  0-15 Units Subcutaneous TID WC   insulin aspart  10 Units Subcutaneous TID WC   insulin glargine-yfgn  30 Units Subcutaneous BID   lidocaine  1 patch Transdermal Q24H   methocarbamol  500 mg Oral BID   midodrine  5 mg Oral TID WC   multivitamin  1 tablet Oral QHS   nicotine  14 mg Transdermal Daily   oxyCODONE  10 mg Oral Q6H   Continuous Infusions:  PRN Meds: acetaminophen (TYLENOL) oral liquid 160 mg/5 mL, albuterol, Gerhardt's butt cream, HYDROmorphone (DILAUDID) injection, naLOXone (NARCAN)  injection, nitroGLYCERIN, ondansetron (ZOFRAN) IV, mouth rinse, sodium chloride   Vital Signs    Vitals:   05/23/23 1730 05/23/23 1735 05/23/23 1758 05/23/23 2109  BP: (!) 96/40 (!) 102/49 (!) 109/96 126/63  Pulse: (!) 106 (!) 107 (!) 108 (!) 103  Resp: (!) 23 (!) 24 18 17   Temp:  98 F (36.7 C)  98.4 F (36.9 C)  TempSrc:    Oral  SpO2: 95% 94% 96% 93%  Weight:      Height:        Intake/Output Summary (Last 24 hours) at 05/24/2023 1159 Last data filed at 05/24/2023 0753 Gross per 24 hour  Intake 720 ml  Output 1000 ml  Net -280 ml      05/23/2023    5:00 AM 05/21/2023    5:55 AM 05/20/2023   11:44 AM  Last 3 Weights  Weight (lbs) 300 lb 11.3 oz 292 lb 15.9 oz 287 lb 4.2 oz  Weight (kg) 136.4 kg 132.9 kg 130.3 kg      Telemetry    Sinus rhythm with rates in the 90s to low 100s. - Personally  Reviewed  ECG    No new ECG tracing. - Personally Reviewed  Physical Exam   GEN: No acute distress.   Neck: No JVD. Cardiac: Borderline tachycardic with normal rhythm. No murmurs, rubs, or gallops.  Respiratory: No increased work of breathing.  Decreased breath sounds in bilateral bases but no wheezes, rhonchi, or rales appreciated.  MS: Trace lower extremity edema bilaterally. No deformity. Neuro:  No deficits.  Psych: Normal affect. Responds appropriately.  Labs    High Sensitivity Troponin:   Recent Labs  Lab 05/05/23 0230 05/05/23 0346 05/06/23 2037 05/07/23 0059  TROPONINIHS 30* 27* 38* 109*     Chemistry Recent Labs  Lab 05/22/23 0316 05/23/23 0316 05/24/23 0324  NA 131* 133* 133*  K 5.1 5.5* 5.0  CL 93* 94* 94*  CO2 24 24 27   GLUCOSE 205* 324* 333*  BUN 98* 124* 77*  CREATININE 5.55* 6.03* 4.13*  CALCIUM 8.9 8.7* 8.8*  MG 2.1 2.2 1.9  ALBUMIN 2.2* 2.2* 2.3*  GFRNONAA 11* 10* 16*  ANIONGAP 14 15 12     Lipids No results for input(s): "CHOL", "  TRIG", "HDL", "LABVLDL", "LDLCALC", "CHOLHDL" in the last 168 hours.  Hematology Recent Labs  Lab 05/21/23 0634 05/22/23 0316 05/23/23 0316  WBC 12.1* 11.7* 11.7*  RBC 4.21* 3.89* 3.84*  HGB 11.0* 10.3* 10.1*  HCT 34.6* 31.9* 31.8*  MCV 82.2 82.0 82.8  MCH 26.1 26.5 26.3  MCHC 31.8 32.3 31.8  RDW 17.3* 17.2* 17.0*  PLT 308 299 315   Thyroid No results for input(s): "TSH", "FREET4" in the last 168 hours.  BNPNo results for input(s): "BNP", "PROBNP" in the last 168 hours.  DDimer No results for input(s): "DDIMER" in the last 168 hours.   Radiology    No results found.  Cardiac Studies   Complete Echocardiogram 05/05/2023: Impressions: 1. Cannot accurately estimate EF despite addition fo Definity contrast  due to forshortened views and shadowing. The left ventricle demonstrates  regional wall motion abnormalities (see scoring diagram/findings for  description). There is hypokinesis of  the apex and  mid to distal anterior wall and anteroseptum.There is mild  asymmetric left ventricular hypertrophy. Left ventricular diastolic  parameters are indeterminate. Elevated left ventricular end-diastolic  pressure.   2. Right ventricular systolic function is normal. The right ventricular  size is normal. There is mildly elevated pulmonary artery systolic  pressure. The estimated right ventricular systolic pressure is 39.4 mmHg.   3. The mitral valve is normal in structure. Mild mitral valve  regurgitation. No evidence of mitral stenosis.   4. The aortic valve is normal in structure. Aortic valve regurgitation is  not visualized. No aortic stenosis is present.   5. The inferior vena cava is dilated in size with <50% respiratory  variability, suggesting right atrial pressure of 15 mmHg.   6. Consider repeat limited echo vs cardiac MRI for further assessment of  EF  _______________  Limited Echocardiogram 05/12/2023: Impressions: 1. Left ventricular ejection fraction, by estimation, is 40 to 45%. The  left ventricle has mildly decreased function. The left ventricle  demonstrates regional wall motion abnormalities (see scoring  diagram/findings for description). There is mild  asymmetric left ventricular hypertrophy of the inferior segment.   2. The mitral valve is normal in structure. Trivial mitral valve  regurgitation.   3. The aortic valve is normal in structure. Aortic valve sclerosis is  present, with no evidence of aortic valve stenosis.   4. There is borderline dilatation of the ascending aorta, measuring 36  mm.  _______________  Right Cardiac Catheterization 05/13/2023: Hemodynamics: RA:                  6 mmHg (mean) RV:                  44/2-7 mmHg PA:                  39/26 mmHg (33 mean) PCWP:            10 mmHg (mean)                                      Estimated Fick CO/CI   5.78 L/min, 2.3 L/min/m2 Thermodilution CO/CI   5 L/min, 2 L/min/m2  TPG                 23  mmHg                                            PVR                 4.6-4 Wood Units  PAPi                2.2   Impressions: Normal right and left heart filling pressures.  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  Moderately reduced cardiac output/index   Patient Profile     58 y.o. male with a history of hypertension, hyperlipidemia, type 2 diabetes mellitus, obstructive sleep apnea, and obesity who was admitted acute CHF on 05/04/2023 after presenting with worsening shortness of breath, edema, and weight gain as well as recent viral URI symptoms. Hospitalization complicated by worsening respiratory failure require intubation (extubated 2/5), mixed cardiogenic / septic shock (secondary to multifocal pneumonia and cystitis) requiring ionotropes, anuric ATN requiring CRR and now iHD, and thrombocytopenia requiring Bivalriudin (HIT panel negative).  Assessment & Plan    Acute HFrEF Mixed Cardiogenic/ Septic Shock Echo showed LVEF of 40-45% with akinesis of the apical inferior segment and apex and hypokinesis of the entire anterior wall, entire lateral wall, entire septum, and inferior wall. Initially required Levophed and Milrinone. Milrinone ultimately had to be stopped to frequent ectopy. 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 - Does not appear significantly volume overloaded on exam.  - Volume status is being managed with hemodialysis. He is auto-diuresing now. - Unable to add any GDMT given soft BP requiring Midodrine and renal function. - Cardiomyopathy felt to be stress induced in setting of sepsis. Can repeat Echo as an outpatient. If EF has not improved, will need to consider an ischemic evaluation.   Acute Hypoxic Respiratory Failure Obstructive  Sleep Apnea Secondary to PNA and acute CHF. Intubated 2/3 and extubated 2/5.  - Improved. Now on room air. - Volume status is being managed with hemodialysis. - He has completed course of antibiotics. - Pulmonology recommended BiPAP at bedtime but he has been refusing this. - Otherwise, management per primary team.  Atrial Flutter with RVR New diagnosis this admission. S/p DCCV on 2/7. - Maintaining sinus rhythm on exam. Rates reasonably well controlled in the 90s to low 100s.  - Continue PO Amiodarone. Currently on 200mg  twice daily. However, he has now completed 5g load so can likely switch to 200mg  daily. Will discuss with MD.  - Continue Eliquis 5mg  twice daily.  Elevated Troponin High-sensitivity troponin 30 >> 27 >> 38 >> 109.  - No chest pain.  - Felt to be consistent with demand ischemia secondary to acute illness. No plans for ischemic evaluation this admission.   Hypertension History of hypertension but struggled with hypotension this admission. BP soft but stable. - Continue Midodrine 5mg  three times daily. - Not on any antihypertensives.  AKI Felt to be secondary to ATN from shock. Initially required CRRT and has now been started on hemodialysis. - Management per Nephrology. They are hoping for renal recovery - he is auto-diuresing now.  Otherwise, per  primary team: - Acute hypoxic respiratory failure  - Septic shock - resolved - Shock liver - resolved - Pneumonia - Cystitis - Type 2 diabetes mellitus - Anemia - Thrombocytopenia - resolved - Epistaxis - imrpoved.   For questions or updates, please contact [facility] Please consult [email or web address] for contact info under        Signed, Corrin Parker, PA-C  05/24/2023, 11:59 AM     Attending Note:   The patient was seen and examined.  Agree with assessment and plan as noted above.  Changes made to the above note as needed.  Patient seen and independently examined with Marjie Skiff, PA .    We discussed all aspects of the encounter. I agree with the assessment and plan as stated above.    Mixed cardiogenic / septic shock  Seems to be doing quite a bit better .  BP is still borderline - requiring midodrine for BP support .  Unable to add additional GDMT for his CHF due to his hypotension .   He seems to be improving from a clinical standpoint   2.  Atrial flutter :  is maintaining NSR .   Will reduce amiodarone to 200 mg a day  Add toprol XL 12.5 mg a day  Cont eliquis    No active cardiac issues.  Will sign off  [facility] will sign off.   Medication Recommendations:   Other recommendations (labs, testing, etc):    Follow up as an outpatient:   with Dr. Rennis Golden / APP     I have spent a total of 40 minutes with patient reviewing hospital  notes , telemetry, EKGs, labs and examining patient as well as establishing an assessment and plan that was discussed with the patient.  > 50% of time was spent in direct patient care.    Vesta Mixer, Montez Hageman., MD, [facility] 05/24/2023, 2:26 PM [address],  [address] Office [phone] Pager [phone]

## 2023-05-24 NOTE — Progress Notes (Signed)
 Occupational Therapy Treatment Patient Details Name: Dylan Owen MRN: [id number] DOB: 11-17-1965 Today's Date: 05/24/2023   History of present illness Pt is 58 yo presenting to [facility] as transfer from [facility] 2/1 due to mixed septic and cardiogenic shock. Pt presented to [facility] hospital due to referral from MD on 05/05/23. Pt started on CRRT on 2/2-2/8, transitioned to HD. Intubated on 2/3 due to constant BiPAP use and unable to wean. Intubation was discontinued on 2/5. On BIPAP on 2/7 then off BiPAP. CRRT discontinued on 2/8.  Had intermittent HD at bedside this am.  PMH: HTN, hyperlipidemia, DM II, sleep apnea, chronic back pain.   OT comments  Pt making good progress towards OT goals. Focused session on standing tolerance during ADLs at sink w/ continued limitations due to DOE. However, pt making improvements of standing transfers to CGA with RW and bathing tasks at [place] A today. Pt remains hopeful for intensive rehab services at [place] to maximize independence and overall quality of life.      If plan is discharge home, recommend the following:  A lot of help with walking and/or transfers;A lot of help with bathing/dressing/bathroom;Assistance with cooking/housework   Equipment Recommendations  Tub/shower bench;Other (comment) (Bariatric rollator)    Recommendations for Other Services      Precautions / Restrictions Precautions Precautions: Fall Precaution/Restrictions Comments: watch SpO2/HR Restrictions Weight Bearing Restrictions Per Provider Order: No       Mobility Bed Mobility               General bed mobility comments: in chair on entry    Transfers Overall transfer level: Needs assistance Equipment used: Rolling walker (2 wheels) Transfers: Sit to/from Stand Sit to Stand: Contact guard assist           General transfer comment: CGA to stand from recliner x 2 for ADLs at sink. good hand placement     Balance Overall balance assessment: Needs  assistance Sitting-balance support: Feet supported, No upper extremity supported Sitting balance-Leahy Scale: Good     Standing balance support: Bilateral upper extremity supported, Reliant on assistive device for balance Standing balance-Leahy Scale: Poor                             ADL either performed or assessed with clinical judgement   ADL Overall ADL's : Needs assistance/impaired     Grooming: Supervision/safety;Standing;Sitting;Wash/dry face;Oral care;Applying deodorant;Set up;Brushing hair Grooming Details (indicate cue type and reason): able to stand at sink for oral care before needing seated rest break. in recliner in front of sink, pt able to manage all other grooming tasks Upper Body Bathing: Minimal assistance;Sitting Upper Body Bathing Details (indicate cue type and reason): assist for back Lower Body Bathing: Minimal assistance;Sitting/lateral leans;Sit to/from stand Lower Body Bathing Details (indicate cue type and reason): able to reach down to bathe lower LE, peri region in standing with Min A required to bathe midline of posterior region (extra care due to impaired skin integrity) Upper Body Dressing : Sitting;Minimal assistance                     General ADL Comments: Emphasis on ADLs standing and seated at sink, cues for breathing. pt reports often using chair at sink for sponge bathing at home vs shower chair in shower    Extremity/Trunk Assessment Upper Extremity Assessment Upper Extremity Assessment: Generalized weakness;Right hand dominant   Lower Extremity Assessment Lower Extremity Assessment: Defer  to PT evaluation        Vision   Vision Assessment?: No apparent visual deficits   Perception     Praxis     Communication Communication Communication: No apparent difficulties   Cognition Arousal: Alert Behavior During Therapy: WFL for tasks assessed/performed Cognition: Cognition impaired     Awareness: Online awareness  impaired, Intellectual awareness intact Memory impairment (select all impairments): Working [employer] fast streamer, Short-term memory Attention impairment (select first level of impairment): Selective attention Executive functioning impairment (select all impairments): Organization, Problem solving OT - Cognition Comments: pleasant, easily distractible but improving. showing fair insight into needs and sequencing of ADLs today                 Following commands: Intact        Cueing   Cueing Techniques: Verbal cues, Tactile cues  Exercises      Shoulder Instructions       General Comments      Pertinent Vitals/ Pain       Pain Assessment Pain Assessment: Faces Faces Pain Scale: Hurts little more Pain Location: bottom Pain Descriptors / Indicators: Sore Pain Intervention(s): Monitored during session, Repositioned, Other (comment) (discussed with RN who is planning to talk to MD to get Gerhards butt cream)  Home Living                                          Prior Functioning/Environment              Frequency  Min 1X/week        Progress Toward Goals  OT Goals(current goals can now be found in the care plan section)  Progress towards OT goals: Progressing toward goals  Acute Rehab OT Goals Patient Stated Goal: go to rehab here, lose some weight OT Goal Formulation: With patient Time For Goal Achievement: 05/26/23 Potential to Achieve Goals: Good ADL Goals Pt Will Perform Grooming: with supervision;sitting Pt Will Perform Upper Body Bathing: with contact guard assist;sitting Pt Will Perform Lower Body Bathing: sitting/lateral leans;sit to/from stand;with min assist Pt Will Perform Lower Body Dressing: with min assist;sit to/from stand;sitting/lateral leans Pt Will Transfer to Toilet: with supervision;ambulating;bedside commode Additional ADL Goal #1: Patient will demonstrate ability to Independently appropriately sequence a 3-step therapeutic or  functional task without cues to increase safety and independence with functional tasks.  Plan      Co-evaluation                 AM-PAC OT "6 Clicks" Daily Activity     Outcome Measure   Help from another person eating meals?: None Help from another person taking care of personal grooming?: A Little Help from another person toileting, which includes using toliet, bedpan, or urinal?: A Lot Help from another person bathing (including washing, rinsing, drying)?: A Little Help from another person to put on and taking off regular upper body clothing?: A Little Help from another person to put on and taking off regular lower body clothing?: A Lot 6 Click Score: 17    End of Session Equipment Utilized During Treatment: Rolling walker (2 wheels);Gait belt  OT Visit Diagnosis: Unsteadiness on feet (R26.81);Other abnormalities of gait and mobility (R26.89);Muscle weakness (generalized) (M62.81);Other symptoms and signs involving cognitive function   Activity Tolerance Patient tolerated treatment well   Patient Left in chair;with call bell/phone within reach   Nurse Communication Mobility status;Other (  comment) (sore on bottom)        Time: 8657-8469 OT Time Calculation (min): 21 min  Charges: OT General Charges $OT Visit: 1 Visit OT Treatments $Self Care/Home Management : 8-22 mins  Bradd Canary, OTR/L Acute Rehab Services Office: [phone]   Lorre Munroe 05/24/2023, 10:29 AM

## 2023-05-24 NOTE — Progress Notes (Signed)
 Inpatient Rehab Admissions Coordinator:    CIR following. Not yet medically ready for CIR as nephrology continues to monitor for renal recovery. I will follow and submit case to insurance once medically ready if Pt. Continues to demonstrate a need at that time. Pt. Updated.   Megan Salon, MS, CCC-SLP Rehab Admissions Coordinator  [phone] (celll) [phone] (office)

## 2023-05-24 NOTE — Plan of Care (Signed)
  Problem: Education: Goal: Individualized Educational Video(s) Outcome: Completed/Met

## 2023-05-24 NOTE — Progress Notes (Signed)
 [facility]                                                                              Dylan Owen, is a 58 y.o. male, DOB - September 25, 1965, WUJ:[id number] Admit date - 05/04/2023    Outpatient Primary MD for the patient is McGowen, Maryjean Morn, MD  LOS - 19  days  Chief Complaint  Patient presents with   Shortness of Breath       Brief summary   Patient is a 58 year old male with obesity, HTN, HLP, diabetes mellitus type 2, OSA, chronic back pain, depression, GERD was admitted on 05/04/2023 with cough, sore throat, dizziness, myalgias for a week.  Patient had also reported chest tightness, headaches, chills, diaphoresis with dyspnea on exertion.  BNP was 1275.  Respiratory virus panel negative.  Patient was placed on Lasix.  Troponins mildly elevated, diagnosis of viral myocarditis was made. 2D echo showed EF ~25% although poor acoustic windows, cardiology was consulted. As of 05/06/2023, creatinine started trending up, was transferred to intensive care unit.  He was placed on BiPAP, milrinone.  Patient was intubated on 2/3 and was extubated on 2/5.  Also started on CRRT, currently on intermittent hemodialysis. Patient was transferred out to the floor and [facility] assumed care on 05/21/2023  Significant Hospital Events:   1/29 Admit to [facility], diuresis 1/30 ECHO w LVEF 25% 1/31 Cards consult, c/f viral myocarditis  2/1 PCCM consult txf to [facility], Adv HF consult  2/2 For HD cath placement and CRRT. Incr D10 for persistent hypogly   2/3 Milrinone stopped. Weaning NE; intubated due to being on bipap for a few days and needing to go to CT  2/5 Extubated. Cleviprex for hypertension   2/6 CT Head for anisocoria, no acute findings. Off clevi. Adding PRN metop. CRRT UF decr to 150/hr.  2/7 On BiPAP. RHC and DCCV  2/8 Off crrt  2/9 Albumin + lasix challenge. HIT neg   2/10: got iHD 2/12: HD cath pulled by pt overnight. Ordered IR for TDC. Holding bivalirudin  2/13: did not get TDC  yesterday because WBC 14.  TDC on 2/13.   2/14: TDC on 2/13, underwent iHD, out of ICU after dialysis.    Assessment & Plan    Sepsis secondary to ? UTI, multifocal MSSA PNA, POA septic shock-resolved -Presented febrile, TEE showed moderately reduced LVEF.  Initial concern for cardiogenic shock with HFrEF, possible viral myocarditis, sequelae of multisystem organ dysfunction.  Per PCCM, likely septic shock, was placed on broad-spectrum antibiotics with meropenem and improved significantly. -Patient had required vasopressors in ICU, currently off -Respiratory virus panel negative, blood cultures 1/31 negative, urine culture showed multiple species.  -Tracheal aspirate showed rare Candida and Staph aureus/MSSA -Patient has completed 7 days of IV meropenem on 05/14/2023 -Sepsis physiology resolved -Foley catheter removed, voiding successfully    Acute hypoxic and hypercarbic resp failure, resolving MSSA PNA -Secondary to PNA, acute systolic heart failure, was intubated on 2/3, extubated on 2/5.  Pulmonology recommended O2, BiPAP PRN + at bedtime - Pulmonary hygiene, encourage OOB/mobility -Completed IV antibiotics -Improved, O2 sats 93% on room air  History of  obstructive sleep apnea -Was recommended BiPAP at bedtime and O2 via [place] during the day, however O2 sats have improved and currently on room air -Outpatient sleep study recommended -Has been refusing BiPAP overnight   New Afib/flutter s/p DCCV 2/7 -- now sinus rhythm Acute systolic HFrEF-- septic cardiomyopathy  pHTN (groud II and III) -Initial concern for viral myocarditis -Initial echo 1/30: Unable to estimate EF due to poor quality - Repeat Echo 2/6 showed EF 40-45%.  - RHC with normal R and L filling pressures, elevated PA mean and PVR. - Initially placed on IV amiodarone drip, transitioned to p.o. amiodarone  -Volume management with intermittent HD -Currently on midodrine -No further bleeding since off the O2, seen by  cardiology, Dr. Elease Hashimoto, recommended to continue eliquis for now.  Epistaxis -Improved, not on O2 now -Continue Eliquis as recommended by cardiology     Acute kidney injury likely ATN AGMA, resolved  -Nephrology following, essentially normal renal function at baseline, 1.1, worsened due to ATN in the setting of shock. -Placed on CRRT on 2/6.  Status post [facility] on 2/13. -Nephrology following -Currently on intermittent HD, hoping for renal recovery  Diabetes mellitus type 2, uncontrolled with hyperglycemia -Hemoglobin A1c 10.2 on 05/05/2023 CBG (last 3)  Recent Labs    05/23/23 2107 05/24/23 0735 05/24/23 1159  GLUCAP 200* 235* 220*   -CBGs still elevated, increased Semglee to 34 units twice daily, continue NovoLog to 10 units 3 times daily AC, moderate SSI    Obesity class II Estimated body mass index is 39.67 kg/m as calculated from the following:   Height as of this encounter: 6\' 1"  (1.854 m).   Weight as of this encounter: 136.4 kg.  Code Status: Full CODE STATUS DVT Prophylaxis:  Place TED hose Start: 05/17/23 1318 apixaban (ELIQUIS) tablet 5 mg   Level of Care: Level of care: Med-Surg Family Communication: Updated patient Disposition Plan:      Remains inpatient appropriate: CIR once medically ready   Procedures:  Intubation, extubation 2D echo Cardiac cath [facility] placement Hemodialysis  Consultants:   Cardiology, nephrology, CCM  Antimicrobials:   Anti-infectives (From admission, onward)    Start     Dose/Rate Route Frequency Ordered Stop   05/19/23 1505  ceFAZolin (ANCEF) IVPB 2g/100 mL premix        over 30 Minutes Intravenous Continuous PRN 05/19/23 1519 05/19/23 1505   05/12/23 2200  meropenem (MERREM) 1 g in sodium chloride 0.9 % 100 mL IVPB        1 g 200 mL/hr over 30 Minutes Intravenous Every 12 hours 05/12/23 0755 05/14/23 2148   05/10/23 0000  vancomycin (VANCOREADY) IVPB 1500 mg/300 mL  Status:  Discontinued        1,500 mg 150 mL/hr over 120  Minutes Intravenous Every 24 hours 05/09/23 0945 05/10/23 0808   05/09/23 1045  vancomycin (VANCOREADY) IVPB 2000 mg/400 mL        2,000 mg 200 mL/hr over 120 Minutes Intravenous  Once 05/09/23 0945 05/09/23 1222   05/08/23 1600  meropenem (MERREM) 1 g in sodium chloride 0.9 % 100 mL IVPB  Status:  Discontinued        1 g 200 mL/hr over 30 Minutes Intravenous Every 8 hours 05/08/23 1505 05/12/23 0755   05/08/23 1500  piperacillin-tazobactam (ZOSYN) IVPB 3.375 g  Status:  Discontinued        3.375 g 100 mL/hr over 30 Minutes Intravenous Every 6 hours 05/08/23 0833 05/08/23 1444   05/07/23 1400  piperacillin-tazobactam (ZOSYN) IVPB 3.375 g  Status:  Discontinued        3.375 g 12.5 mL/hr over 240 Minutes Intravenous Every 8 hours 05/07/23 0836 05/08/23 0832   05/07/23 1200  ceFEPIme (MAXIPIME) 2 g in sodium chloride 0.9 % 100 mL IVPB  Status:  Discontinued        2 g 200 mL/hr over 30 Minutes Intravenous Every 12 hours 05/06/23 2346 05/07/23 0827   05/07/23 0930  piperacillin-tazobactam (ZOSYN) IVPB 3.375 g        3.375 g 100 mL/hr over 30 Minutes Intravenous  Once 05/07/23 0836 05/07/23 1200   05/07/23 0030  ceFEPIme (MAXIPIME) 2 g in sodium chloride 0.9 % 100 mL IVPB        2 g 200 mL/hr over 30 Minutes Intravenous  Once 05/06/23 2339 05/07/23 0100   05/07/23 0030  metroNIDAZOLE (FLAGYL) IVPB 500 mg  Status:  Discontinued        500 mg 100 mL/hr over 60 Minutes Intravenous Every 12 hours 05/06/23 2339 05/07/23 0827          Medications  amiodarone  200 mg Oral BID   apixaban  5 mg Oral BID   bacitracin   Topical BID   Chlorhexidine Gluconate Cloth  6 each Topical Daily   famotidine  20 mg Oral Daily   feeding supplement  237 mL Oral BID BM   insulin aspart  0-15 Units Subcutaneous TID WC   insulin aspart  10 Units Subcutaneous TID WC   insulin glargine-yfgn  30 Units Subcutaneous BID   lidocaine  1 patch Transdermal Q24H   methocarbamol  500 mg Oral BID   midodrine  5 mg  Oral TID WC   multivitamin  1 tablet Oral QHS   nicotine  14 mg Transdermal Daily   oxyCODONE  10 mg Oral Q6H      Subjective:   Dylan Owen was seen and examined today.  Foley catheter removed yesterday, voiding successfully, no acute issues.  Off the O2 sats stable in 90s.  No further epistaxis.  Tolerating breakfast.  Overall improving.  Objective:   Vitals:   05/23/23 1730 05/23/23 1735 05/23/23 1758 05/23/23 2109  BP: (!) 96/40 (!) 102/49 (!) 109/96 126/63  Pulse: (!) 106 (!) 107 (!) 108 (!) 103  Resp: (!) 23 (!) 24 18 17   Temp:  98 F (36.7 C)  98.4 F (36.9 C)  TempSrc:    Oral  SpO2: 95% 94% 96% 93%  Weight:      Height:        Intake/Output Summary (Last 24 hours) at 05/24/2023 1231 Last data filed at 05/24/2023 0753 Gross per 24 hour  Intake 720 ml  Output 1000 ml  Net -280 ml     Wt Readings from Last 3 Encounters:  05/23/23 (!) 136.4 kg  05/04/23 (!) 148.8 kg  12/09/22 (!) 153.3 kg    Physical Exam General: Alert and oriented x 3, NAD Cardiovascular: S1 S2 clear, RRR.  Respiratory: CTAB, no wheezing Gastrointestinal: Soft, nontender, nondistended, NBS, obese Ext: 1+ pedal edema bilaterally Neuro: no new deficits Psych: Normal affect     Data Reviewed:  I have personally reviewed following labs    CBC Lab Results  Component Value Date   WBC 11.7 (H) 05/23/2023   RBC 3.84 (L) 05/23/2023   HGB 10.1 (L) 05/23/2023   HCT 31.8 (L) 05/23/2023   MCV 82.8 05/23/2023   MCH 26.3 05/23/2023   PLT 315 05/23/2023  MCHC 31.8 05/23/2023   RDW 17.0 (H) 05/23/2023   LYMPHSABS 0.7 05/06/2023   MONOABS 2.3 (H) 05/06/2023   EOSABS 0.0 05/06/2023   BASOSABS 0.1 05/06/2023     Last metabolic panel Lab Results  Component Value Date   NA 133 (L) 05/24/2023   K 5.0 05/24/2023   CL 94 (L) 05/24/2023   CO2 27 05/24/2023   BUN 77 (H) 05/24/2023   CREATININE 4.13 (H) 05/24/2023   GLUCOSE 333 (H) 05/24/2023   GFRNONAA 16 (L) 05/24/2023   GFRAA  >90 10/09/2011   CALCIUM 8.8 (L) 05/24/2023   PHOS 4.2 05/24/2023   PROT 5.9 (L) 05/16/2023   ALBUMIN 2.3 (L) 05/24/2023   BILITOT 2.0 (H) 05/16/2023   ALKPHOS 85 05/16/2023   AST 35 05/16/2023   ALT 40 05/16/2023   ANIONGAP 12 05/24/2023    CBG (last 3)  Recent Labs    05/23/23 2107 05/24/23 0735 05/24/23 1159  GLUCAP 200* 235* 220*      Coagulation Profile: Recent Labs  Lab 05/18/23 0540 05/19/23 0448  INR 1.5* 1.5*     Radiology Studies: I have personally reviewed the imaging studies  No results found.      Thad Ranger M.D. [facility] 05/24/2023, 12:31 PM  Available via Epic secure chat 7am-7pm After 7 pm, please refer to night coverage provider listed on amion.

## 2023-05-24 NOTE — Consult Note (Addendum)
 WOC Nurse Consult Note: Reason for Consult: Requested to assess a injury device related by BPAP. Pt is not using the device anymore. Wound type: device related on the top of the nose. Left cheek healed. Pressure Injury POA: NA Measurement: 0.5 x 0.5 cm Wound bed: cover by dry dark scab. No signs of inflammation/infection. Wound is in process to heal. Drainage (amount, consistency, odor) none. Periwound: intact, new tissue, pink skin. Dressing procedure/placement/frequency: Cleaned with vashe (#[id number]) daily . Keep dry and clean. No dressing needed. Avoid pressure on the area.  WOC team will not plan to follow further.  Please reconsult if further assistance is needed. Thank-you,  Denyse Amass BSN, RN, [employer], WOC  (Pager: [phone])

## 2023-05-24 NOTE — Progress Notes (Signed)
 Patient ID: Dylan Owen, male   DOB: [birth year], 58 y.o.   MRN: [id number] [facility] Progress Note   Assessment/ Plan:   1. Acute kidney Injury: With essentially normal renal function at baseline (creatinine 1.1) and suffered ATN in the setting of shock.  CRRT early on.  Creatinine now greatly improving.  BUN still rising so plan for dialysis today but likely in a state of recovery - s/p [facility] 2/13  - HD 2/17 -Hold on dialysis tomorrow and monitor labs closely, continue to monitor for recovery  2.  Mixed cardiogenic/septic shock: Clinically suspected to be associated with viral myocarditis/acute CHF exacerbation.  Urine output improving.  Continue to monitor for recovery.  Lasix if needed 3.  Shock liver: Transaminases appear to be trending down with hemodynamic support.   4.  Acute hypoxic/hypercarbic respiratory failure: Now improved with ultrafiltration 5. Anemia-hemoglobin 10.1.  Continue to monitor 6.  Uncontrolled type 2 diabetes with hyperglycemia.  Management per primary team 7.  Hyperkalemia: Controlled with dialysis  Subjective:   Tolerated dialysis yesterday.  Made 1 L of urine.  Having some diarrhea but denies any other complaints   Objective:   BP 126/63 (BP Location: Left Arm)   Pulse (!) 103   Temp 98.4 F (36.9 C) (Oral)   Resp 17   Ht 6\' 1"  (1.854 m)   Wt (!) 136.4 kg   SpO2 93%   BMI 39.67 kg/m   Intake/Output Summary (Last 24 hours) at 05/24/2023 1030 Last data filed at 05/24/2023 0753 Gross per 24 hour  Intake 720 ml  Output 1000 ml  Net -280 ml   Weight change:   Physical Exam: Gen: Sitting on the toilet, no distress CVS: Tachycardia, no rub Resp: Bilateral chest rise with no increased work of breathing Abd: Soft, obese, nontender, bowel sounds normal Ext: 1+ edema in the bilateral lower extremities, warm and well-perfused  Imaging: No results found.    Labs: BMET Recent Labs  Lab 05/18/23 0540 05/19/23 0448  05/20/23 0432 05/21/23 0458 05/22/23 0316 05/23/23 0316 05/24/23 0324  NA 130* 132* 128* 130* 131* 133* 133*  K 4.4 4.8 5.5* 5.1 5.1 5.5* 5.0  CL 95* 95* 93* 92* 93* 94* 94*  CO2 22 22 21* 25 24 24 27   GLUCOSE 313* 241* 351* 146* 205* 324* 333*  BUN 84* 105* 118* 75* 98* 124* 77*  CREATININE 6.31* 6.74* 6.96* 4.99* 5.55* 6.03* 4.13*  CALCIUM 8.1* 8.5* 8.7* 8.5* 8.9 8.7* 8.8*  PHOS 4.2 4.3 4.6 4.5 5.1* 5.8* 4.2   CBC Recent Labs  Lab 05/20/23 0432 05/21/23 0634 05/22/23 0316 05/23/23 0316  WBC 14.9* 12.1* 11.7* 11.7*  HGB 12.4* 11.0* 10.3* 10.1*  HCT 37.8* 34.6* 31.9* 31.8*  MCV 80.3 82.2 82.0 82.8  PLT 334 308 299 315    Medications:     amiodarone  200 mg Oral BID   apixaban  5 mg Oral BID   bacitracin   Topical BID   Chlorhexidine Gluconate Cloth  6 each Topical Daily   famotidine  20 mg Oral Daily   feeding supplement  237 mL Oral BID BM   insulin aspart  0-15 Units Subcutaneous TID WC   insulin aspart  10 Units Subcutaneous TID WC   insulin glargine-yfgn  30 Units Subcutaneous BID   lidocaine  1 patch Transdermal Q24H   methocarbamol  500 mg Oral BID   midodrine  5 mg Oral TID WC   multivitamin  1 tablet Oral QHS  nicotine  14 mg Transdermal Daily   oxyCODONE  10 mg Oral Q6H    Darnell Level  05/24/2023, 10:30 AM

## 2023-05-25 DIAGNOSIS — I509 Heart failure, unspecified: Secondary | ICD-10-CM | POA: Diagnosis not present

## 2023-05-25 DIAGNOSIS — I4891 Unspecified atrial fibrillation: Secondary | ICD-10-CM | POA: Diagnosis not present

## 2023-05-25 DIAGNOSIS — R7989 Other specified abnormal findings of blood chemistry: Secondary | ICD-10-CM | POA: Diagnosis not present

## 2023-05-25 DIAGNOSIS — J9601 Acute respiratory failure with hypoxia: Secondary | ICD-10-CM | POA: Diagnosis not present

## 2023-05-25 LAB — RENAL FUNCTION PANEL
Albumin: 2.4 g/dL — ABNORMAL LOW (ref 3.5–5.0)
Anion gap: 12 (ref 5–15)
BUN: 100 mg/dL — ABNORMAL HIGH (ref 6–20)
CO2: 26 mmol/L (ref 22–32)
Calcium: 9.2 mg/dL (ref 8.9–10.3)
Chloride: 101 mmol/L (ref 98–111)
Creatinine, Ser: 4.51 mg/dL — ABNORMAL HIGH (ref 0.61–1.24)
GFR, Estimated: 14 mL/min — ABNORMAL LOW (ref >60)
Glucose, Bld: 148 mg/dL — ABNORMAL HIGH (ref 70–99)
Phosphorus: 5.6 mg/dL — ABNORMAL HIGH (ref 2.5–4.6)
Potassium: 4.8 mmol/L (ref 3.5–5.1)
Sodium: 139 mmol/L (ref 135–145)

## 2023-05-25 LAB — GLUCOSE, CAPILLARY
Glucose-Capillary: 137 mg/dL — ABNORMAL HIGH (ref 70–99)
Glucose-Capillary: 102 mg/dL — ABNORMAL HIGH (ref 70–99)
Glucose-Capillary: 236 mg/dL — ABNORMAL HIGH (ref 70–99)
Glucose-Capillary: 187 mg/dL — ABNORMAL HIGH (ref 70–99)

## 2023-05-25 LAB — MAGNESIUM: Magnesium: 2 mg/dL (ref 1.7–2.4)

## 2023-05-25 NOTE — Inpatient Diabetes Management (Signed)
 Inpatient Diabetes Program Recommendations  AACE/ADA: New Consensus Statement on Inpatient Glycemic Control (2015)  Target Ranges:  Prepandial:   less than 140 mg/dL      Peak postprandial:   less than 180 mg/dL (1-2 hours)      Critically ill patients:  140 - 180 mg/dL   Lab Results  Component Value Date   GLUCAP 102 (H) 05/25/2023   HGBA1C 10.2 (H) 05/05/2023     Went to see pt at the request of PT and pt. Pt expressed concern about sustaining a lifestyle change in continuing to keep glucose trends low and loose weight at home. Reviewed diet and exercise regimens again with pt. Will attach handouts with food suggestions and tips to AVS for pt to refer to when at home.  Thanks,  Christena Deem RN, MSN, BC-ADM Inpatient Diabetes Coordinator Team Pager [phone] (8a-5p)

## 2023-05-25 NOTE — Progress Notes (Signed)
 Patient in no distress at this time, bipap not needed.

## 2023-05-25 NOTE — Progress Notes (Signed)
 Physical Therapy Treatment Patient Details Name: Dylan Owen MRN: [id number] DOB: [birth year] Today's Date: 05/25/2023   History of Present Illness Pt is 58 yo presenting to [facility] as transfer from [facility] 2/1 due to mixed septic and cardiogenic shock. Pt presented to [facility] hospital due to referral from MD on 05/05/23. Pt started on CRRT on 2/2-2/8, transitioned to HD. Intubated on 2/3 due to constant BiPAP use and unable to wean. Intubation was discontinued on 2/5. On BIPAP on 2/7 then off BiPAP. CRRT discontinued on 2/8.  Had intermittent HD at bedside this am.  PMH: HTN, hyperlipidemia, DM II, sleep apnea, chronic back pain.    PT Comments  Continuing work on functional mobility and activity tolerance;  Session focused on progressive amb, with excellent progress noted; Acute PT goals met, and will upgrade them today; Walked in the hallway with CGA and chair follow; on room air, spot checked O2 sats and they were 90%, 96%, 94% (once we had a good pleth wave for a reading); He needed 1 seated rest break; Notable DOE when pt needed to sit -- showing solid activity tolerance    If plan is discharge home, recommend the following: A little help with walking and/or transfers;A little help with bathing/dressing/bathroom   Can travel by private vehicle        Equipment Recommendations  BSC/3in1;Rollator (4 wheels) Engineer, manufacturing)    Recommendations for Other Services       Precautions / Restrictions Precautions Precautions: Fall Precaution/Restrictions Comments: watch SpO2/HR     Mobility  Bed Mobility Overal bed mobility: Needs Assistance Bed Mobility: Supine to Sit     Supine to sit: Contact guard, Used rails     General bed mobility comments: Incr time to elevate trunk    Transfers Overall transfer level: Needs assistance Equipment used: Rolling walker (2 wheels) Transfers: Sit to/from Stand Sit to Stand: Min assist, Contact guard assist           General transfer comment:  Light min assist to stand and steady form the bed; easier to rise pushing up form armrests of recliner    Ambulation/Gait Ambulation/Gait assistance: Contact guard assist Gait Distance (Feet): 180 Feet (1 seated rest break) Assistive device: Rolling walker (2 wheels) Gait Pattern/deviations: Step-through pattern, Decreased stride length, Wide base of support       General Gait Details: cues for self-monitor for activity tolerance; abel to walk close to household distances on room air   Stairs             Wheelchair Mobility     Tilt Bed    Modified Rankin (Stroke Patients Only)       Balance     Sitting balance-Leahy Scale: Good       Standing balance-Leahy Scale: Poor Standing balance comment: heavily dependent on UEs on walker, no overt LOB                            Communication Communication Communication: No apparent difficulties  Cognition Arousal: Alert Behavior During Therapy: WFL for tasks assessed/performed                             Following commands: Intact      Cueing Cueing Techniques: Verbal cues, Tactile cues  Exercises      General Comments General comments (skin integrity, edema, etc.): Walked on room air and O2 sats stayed at  or above 89%      Pertinent Vitals/Pain Pain Assessment Pain Assessment: Faces Faces Pain Scale: Hurts a little bit Pain Location: bottom Pain Descriptors / Indicators: Sore Pain Intervention(s): Monitored during session    Home Living                          Prior Function            PT Goals (current goals can now be found in the care plan section) Acute Rehab PT Goals Patient Stated Goal: to improve mobility PT Goal Formulation: With patient Time For Goal Achievement: 06/08/23 Potential to Achieve Goals: Good Progress towards PT goals: Progressing toward goals;Goals updated    Frequency    Min 1X/week      PT Plan      Co-evaluation               AM-PAC PT "6 Clicks" Mobility   Outcome Measure  Help needed turning from your back to your side while in a flat bed without using bedrails?: A Little Help needed moving from lying on your back to sitting on the side of a flat bed without using bedrails?: A Little Help needed moving to and from a bed to a chair (including a wheelchair)?: A Little Help needed standing up from a chair using your arms (e.g., wheelchair or bedside chair)?: A Little Help needed to walk in hospital room?: A Little Help needed climbing 3-5 steps with a railing? : A Lot 6 Click Score: 17    End of Session Equipment Utilized During Treatment: Gait belt Activity Tolerance: Patient tolerated treatment well Patient left: in chair;with call bell/phone within reach Nurse Communication: Mobility status PT Visit Diagnosis: Unsteadiness on feet (R26.81);Other abnormalities of gait and mobility (R26.89);Muscle weakness (generalized) (M62.81)     Time: 0981-1914 PT Time Calculation (min) (ACUTE ONLY): 38 min  Charges:    $Gait Training: 23-37 mins $Therapeutic Activity: 8-22 mins PT General Charges $$ ACUTE PT VISIT: 1 Visit                     Van Clines, PT  Acute Rehabilitation Services Office [phone] Secure Chat welcomed    [name] 05/25/2023, 3:02 PM

## 2023-05-25 NOTE — Progress Notes (Signed)
 [facility]                                                                              Dylan Owen, is a 58 y.o. male, DOB - [birth year], WUJ:[id number] Admit date - 05/04/2023    Outpatient Primary MD for the patient is McGowen, Maryjean Morn, MD  LOS - 20  days  Chief Complaint  Patient presents with   Shortness of Breath       Brief summary   Patient is a 58 year old male with obesity, HTN, HLP, diabetes mellitus type 2, OSA, chronic back pain, depression, GERD was admitted on 05/04/2023 with cough, sore throat, dizziness, myalgias for a week.  Patient had also reported chest tightness, headaches, chills, diaphoresis with dyspnea on exertion.  BNP was 1275.  Respiratory virus panel negative.  Patient was placed on Lasix.  Troponins mildly elevated, diagnosis of viral myocarditis was made. 2D echo showed EF ~25% although poor acoustic windows, cardiology was consulted. As of 05/06/2023, creatinine started trending up, was transferred to intensive care unit.  He was placed on BiPAP, milrinone.  Patient was intubated on 2/3 and was extubated on 2/5.  Also started on CRRT, currently on intermittent hemodialysis. Patient was transferred out to the floor and [facility] assumed care on 05/21/2023  Significant Hospital Events:   1/29 Admit to [facility], diuresis 1/30 ECHO w LVEF 25% 1/31 Cards consult, c/f viral myocarditis  2/1 PCCM consult txf to [facility], Adv HF consult  2/2 For HD cath placement and CRRT. Incr D10 for persistent hypogly   2/3 Milrinone stopped. Weaning NE; intubated due to being on bipap for a few days and needing to go to CT  2/5 Extubated. Cleviprex for hypertension   2/6 CT Head for anisocoria, no acute findings. Off clevi. Adding PRN metop. CRRT UF decr to 150/hr.  2/7 On BiPAP. RHC and DCCV  2/8 Off crrt  2/9 Albumin + lasix challenge. HIT neg   2/10: got iHD 2/12: HD cath pulled by pt overnight. Ordered IR for TDC. Holding bivalirudin  2/13: did not get TDC  yesterday because WBC 14.  TDC on 2/13.   2/14: TDC on 2/13, underwent iHD, out of ICU after dialysis.    Assessment & Plan    Sepsis secondary to ? UTI, multifocal MSSA PNA, POA septic shock-resolved -Presented febrile, TEE showed moderately reduced LVEF.  Initial concern for cardiogenic shock with HFrEF, possible viral myocarditis, sequelae of multisystem organ dysfunction.  Per PCCM, likely septic shock, was placed on broad-spectrum antibiotics with meropenem and improved significantly. -Patient had required vasopressors in ICU, currently off -Respiratory virus panel negative, blood cultures 1/31 negative, urine culture showed multiple species.  -Tracheal aspirate showed rare Candida and Staph aureus/MSSA -Patient has completed 7 days of IV meropenem on 05/14/2023 -Sepsis physiology resolved -Foley catheter removed, voiding successfully    Acute hypoxic and hypercarbic resp failure, resolving MSSA PNA History of obstructive sleep apnea -Secondary to PNA, acute systolic heart failure, was intubated on 2/3, extubated on 2/5.  Pulmonology recommended O2, BiPAP PRN + at bedtime -Hypoxia resolved, O2 sats 100% on room air  -Outpatient sleep study recommended,  has been refusing BiPAP intermittently at night     Acute systolic HFrEF-- septic cardiomyopathy  pHTN (groud II and III) -Initial echo 1/30: Unable to estimate EF due to poor quality - Repeat Echo 2/6 showed EF 40-45%.  - RHC with normal R and L filling pressures, elevated PA mean and PVR. -Volume management with intermittent HD.  Unable to add GDMT due to hypotension. -BP improving, continue midodrine  -outpatient follow-up with Dr. Rennis Golden scheduled  New Afib/flutter s/p DCCV 2/7 -- now sinus rhythm -No further epistaxis, continue eliquis -Maintaining NSR, cardiology recommended reducing amiodarone to 200 mg daily, -Will add Toprol-XL 12.5 mg daily for HR if BP allows.  Epistaxis -Improved, not on O2 now -Continue Eliquis  as recommended by cardiology    Acute kidney injury likely ATN AGMA, resolved  -Nephrology following, essentially normal renal function at baseline, 1.1, worsened due to ATN in the setting of shock. -Placed on CRRT on 2/6.  Status post [facility] on 2/13. -Nephrology following -Currently on intermittent HD, hoping for renal recovery  Diabetes mellitus type 2, uncontrolled with hyperglycemia -Hemoglobin A1c 10.2 on 05/05/2023 CBG (last 3)  Recent Labs    05/24/23 1625 05/24/23 2059 05/25/23 0739  GLUCAP 110* 167* 137*   -CBGs better, continue Semglee 34 units twice daily, NovoLog 10 units 3 times daily AC, moderate SSI   Obesity class II Estimated body mass index is 39.67 kg/m as calculated from the following:   Height as of this encounter: 6\' 1"  (1.854 m).   Weight as of this encounter: 136.4 kg.  Code Status: Full CODE STATUS DVT Prophylaxis:  Place TED hose Start: 05/17/23 1318 apixaban (ELIQUIS) tablet 5 mg   Level of Care: Level of care: Med-Surg Family Communication: Updated patient Disposition Plan:      Remains inpatient appropriate: CIR once medically ready   Procedures:  Intubation, extubation 2D echo Cardiac cath [facility] placement Hemodialysis  Consultants:   Cardiology, nephrology, CCM  Antimicrobials:   Anti-infectives (From admission, onward)    Start     Dose/Rate Route Frequency Ordered Stop   05/19/23 1505  ceFAZolin (ANCEF) IVPB 2g/100 mL premix        over 30 Minutes Intravenous Continuous PRN 05/19/23 1519 05/19/23 1505   05/12/23 2200  meropenem (MERREM) 1 g in sodium chloride 0.9 % 100 mL IVPB        1 g 200 mL/hr over 30 Minutes Intravenous Every 12 hours 05/12/23 0755 05/14/23 2148   05/10/23 0000  vancomycin (VANCOREADY) IVPB 1500 mg/300 mL  Status:  Discontinued        1,500 mg 150 mL/hr over 120 Minutes Intravenous Every 24 hours 05/09/23 0945 05/10/23 0808   05/09/23 1045  vancomycin (VANCOREADY) IVPB 2000 mg/400 mL        2,000 mg 200  mL/hr over 120 Minutes Intravenous  Once 05/09/23 0945 05/09/23 1222   05/08/23 1600  meropenem (MERREM) 1 g in sodium chloride 0.9 % 100 mL IVPB  Status:  Discontinued        1 g 200 mL/hr over 30 Minutes Intravenous Every 8 hours 05/08/23 1505 05/12/23 0755   05/08/23 1500  piperacillin-tazobactam (ZOSYN) IVPB 3.375 g  Status:  Discontinued        3.375 g 100 mL/hr over 30 Minutes Intravenous Every 6 hours 05/08/23 0833 05/08/23 1444   05/07/23 1400  piperacillin-tazobactam (ZOSYN) IVPB 3.375 g  Status:  Discontinued        3.375 g 12.5 mL/hr over 240 Minutes  Intravenous Every 8 hours 05/07/23 0836 05/08/23 0832   05/07/23 1200  ceFEPIme (MAXIPIME) 2 g in sodium chloride 0.9 % 100 mL IVPB  Status:  Discontinued        2 g 200 mL/hr over 30 Minutes Intravenous Every 12 hours 05/06/23 2346 05/07/23 0827   05/07/23 0930  piperacillin-tazobactam (ZOSYN) IVPB 3.375 g        3.375 g 100 mL/hr over 30 Minutes Intravenous  Once 05/07/23 0836 05/07/23 1200   05/07/23 0030  ceFEPIme (MAXIPIME) 2 g in sodium chloride 0.9 % 100 mL IVPB        2 g 200 mL/hr over 30 Minutes Intravenous  Once 05/06/23 2339 05/07/23 0100   05/07/23 0030  metroNIDAZOLE (FLAGYL) IVPB 500 mg  Status:  Discontinued        500 mg 100 mL/hr over 60 Minutes Intravenous Every 12 hours 05/06/23 2339 05/07/23 0827          Medications  amiodarone  200 mg Oral Daily   apixaban  5 mg Oral BID   bacitracin   Topical BID   Chlorhexidine Gluconate Cloth  6 each Topical Daily   famotidine  20 mg Oral Daily   feeding supplement  237 mL Oral BID BM   insulin aspart  0-15 Units Subcutaneous TID WC   insulin aspart  10 Units Subcutaneous TID WC   insulin glargine-yfgn  34 Units Subcutaneous BID   lidocaine  1 patch Transdermal Q24H   methocarbamol  500 mg Oral BID   midodrine  5 mg Oral TID WC   multivitamin  1 tablet Oral QHS   nicotine  14 mg Transdermal Daily   oxyCODONE  10 mg Oral Q6H      Subjective:   Dylan  Owen was seen and examined today.  No acute complaints, no further epistaxis or any hematuria.  Overall improving, on room air.  Objective:   Vitals:   05/24/23 1928 05/24/23 2102 05/25/23 0411 05/25/23 0823  BP:  (!) 117/53 132/66 129/61  Pulse: (!) 102 100 100 (!) 101  Resp:  16 18 19   Temp:  97.7 F (36.5 C) (!) 97.4 F (36.3 C) 98 F (36.7 C)  TempSrc:  Oral Oral Oral  SpO2: 94% 95% 96% 100%  Weight:      Height:        Intake/Output Summary (Last 24 hours) at 05/25/2023 1028 Last data filed at 05/25/2023 0829 Gross per 24 hour  Intake 840 ml  Output 1700 ml  Net -860 ml     Wt Readings from Last 3 Encounters:  05/23/23 (!) 136.4 kg  05/04/23 (!) 148.8 kg  12/09/22 (!) 153.3 kg   Physical Exam General: Alert and oriented x 3, NAD Cardiovascular: S1 S2 clear, RRR.  Respiratory: Decreased BS at the bases, no wheezing Gastrointestinal: Soft, nontender, nondistended, NBS Ext: trace -1+ pedal edema bilaterally Neuro: no new deficits Psych: Normal affect     Data Reviewed:  I have personally reviewed following labs    CBC Lab Results  Component Value Date   WBC 11.7 (H) 05/23/2023   RBC 3.84 (L) 05/23/2023   HGB 10.1 (L) 05/23/2023   HCT 31.8 (L) 05/23/2023   MCV 82.8 05/23/2023   MCH 26.3 05/23/2023   PLT 315 05/23/2023   MCHC 31.8 05/23/2023   RDW 17.0 (H) 05/23/2023   LYMPHSABS 0.7 05/06/2023   MONOABS 2.3 (H) 05/06/2023   EOSABS 0.0 05/06/2023   BASOSABS 0.1 05/06/2023  Last metabolic panel Lab Results  Component Value Date   NA 139 05/25/2023   K 4.8 05/25/2023   CL 101 05/25/2023   CO2 26 05/25/2023   BUN 100 (H) 05/25/2023   CREATININE 4.51 (H) 05/25/2023   GLUCOSE 148 (H) 05/25/2023   GFRNONAA 14 (L) 05/25/2023   GFRAA >90 10/09/2011   CALCIUM 9.2 05/25/2023   PHOS 5.6 (H) 05/25/2023   PROT 5.9 (L) 05/16/2023   ALBUMIN 2.4 (L) 05/25/2023   BILITOT 2.0 (H) 05/16/2023   ALKPHOS 85 05/16/2023   AST 35 05/16/2023   ALT 40  05/16/2023   ANIONGAP 12 05/25/2023    CBG (last 3)  Recent Labs    05/24/23 1625 05/24/23 2059 05/25/23 0739  GLUCAP 110* 167* 137*      Coagulation Profile: Recent Labs  Lab 05/19/23 0448  INR 1.5*     Radiology Studies: I have personally reviewed the imaging studies  No results found.      Thad Ranger M.D. [facility] 05/25/2023, 10:28 AM  Available via Epic secure chat 7am-7pm After 7 pm, please refer to night coverage provider listed on amion.

## 2023-05-25 NOTE — Progress Notes (Signed)
 Patient ID: Dylan Owen, male   DOB: 1966/01/11, 58 y.o.   MRN: [id number] [facility] Progress Note   Assessment/ Plan:   1. Acute kidney Injury: With essentially normal renal function at baseline (creatinine 1.1) and suffered ATN in the setting of shock.  CRRT early on.  Creatinine now greatly improving.  BUN still rising so plan for dialysis today but likely in a state of recovery - s/p [facility] 2/13  - HD 2/17; likely plan on dialysis tomorrow -Continue to monitor for signs of recovery  2.  Mixed cardiogenic/septic shock: Clinically suspected to be associated with viral myocarditis/acute CHF exacerbation.  Urine output improving.  Continue to monitor for recovery.  Lasix if needed 3.  Shock liver: Transaminases appear to be trending down with hemodynamic support.   4.  Acute hypoxic/hypercarbic respiratory failure: Now improved with ultrafiltration 5. Anemia-hemoglobin 10.1.  Continue to monitor 6.  Uncontrolled type 2 diabetes with hyperglycemia.  Management per primary team   Subjective:   Patient feels well.  Continues to have decent urine output.  Creatinine slightly higher.  BUN increased.   Objective:   BP 129/61 (BP Location: Right Arm)   Pulse (!) 101   Temp 98 F (36.7 C) (Oral)   Resp 19   Ht 6\' 1"  (1.854 m)   Wt (!) 136.4 kg   SpO2 100%   BMI 39.67 kg/m   Intake/Output Summary (Last 24 hours) at 05/25/2023 1106 Last data filed at 05/25/2023 1000 Gross per 24 hour  Intake 840 ml  Output 1900 ml  Net -1060 ml   Weight change:   Physical Exam: Gen: Sitting in bed, no distress CVS: Tachycardia, no rub Resp: Bilateral chest rise with no increased work of breathing Abd: Soft, obese, nontender, bowel sounds normal Ext: 1+ edema in the bilateral lower extremities, warm and well-perfused  Imaging: No results found.    Labs: BMET Recent Labs  Lab 05/19/23 0448 05/20/23 0432 05/21/23 0458 05/22/23 0316 05/23/23 0316 05/24/23 0324  05/25/23 0335  NA 132* 128* 130* 131* 133* 133* 139  K 4.8 5.5* 5.1 5.1 5.5* 5.0 4.8  CL 95* 93* 92* 93* 94* 94* 101  CO2 22 21* 25 24 24 27 26   GLUCOSE 241* 351* 146* 205* 324* 333* 148*  BUN 105* 118* 75* 98* 124* 77* 100*  CREATININE 6.74* 6.96* 4.99* 5.55* 6.03* 4.13* 4.51*  CALCIUM 8.5* 8.7* 8.5* 8.9 8.7* 8.8* 9.2  PHOS 4.3 4.6 4.5 5.1* 5.8* 4.2 5.6*   CBC Recent Labs  Lab 05/20/23 0432 05/21/23 0634 05/22/23 0316 05/23/23 0316  WBC 14.9* 12.1* 11.7* 11.7*  HGB 12.4* 11.0* 10.3* 10.1*  HCT 37.8* 34.6* 31.9* 31.8*  MCV 80.3 82.2 82.0 82.8  PLT 334 308 299 315    Medications:     amiodarone  200 mg Oral Daily   apixaban  5 mg Oral BID   bacitracin   Topical BID   Chlorhexidine Gluconate Cloth  6 each Topical Daily   famotidine  20 mg Oral Daily   feeding supplement  237 mL Oral BID BM   insulin aspart  0-15 Units Subcutaneous TID WC   insulin aspart  10 Units Subcutaneous TID WC   insulin glargine-yfgn  34 Units Subcutaneous BID   lidocaine  1 patch Transdermal Q24H   methocarbamol  500 mg Oral BID   midodrine  5 mg Oral TID WC   multivitamin  1 tablet Oral QHS   nicotine  14 mg Transdermal Daily  oxyCODONE  10 mg Oral Q6H    Darnell Level  05/25/2023, 11:06 AM

## 2023-05-25 NOTE — Progress Notes (Signed)
 Secure chat sent to nurse. Notified MD ordered needed for removal of PICC line. Nurse acknowledged. Tomasita Morrow, RN [facility]

## 2023-05-25 NOTE — Plan of Care (Signed)
  Problem: Health Behavior/Discharge Planning: Goal: Ability to identify and utilize available resources and services will improve Outcome: Completed/Met

## 2023-05-26 ENCOUNTER — Other Ambulatory Visit: Payer: Self-pay | Admitting: Family Medicine

## 2023-05-26 DIAGNOSIS — R7989 Other specified abnormal findings of blood chemistry: Secondary | ICD-10-CM | POA: Diagnosis not present

## 2023-05-26 DIAGNOSIS — I509 Heart failure, unspecified: Secondary | ICD-10-CM | POA: Diagnosis not present

## 2023-05-26 DIAGNOSIS — J9601 Acute respiratory failure with hypoxia: Secondary | ICD-10-CM | POA: Diagnosis not present

## 2023-05-26 DIAGNOSIS — I4891 Unspecified atrial fibrillation: Secondary | ICD-10-CM | POA: Diagnosis not present

## 2023-05-26 LAB — RENAL FUNCTION PANEL
Albumin: 2.3 g/dL — ABNORMAL LOW (ref 3.5–5.0)
Anion gap: 13 (ref 5–15)
BUN: 107 mg/dL — ABNORMAL HIGH (ref 6–20)
CO2: 24 mmol/L (ref 22–32)
Calcium: 8.7 mg/dL — ABNORMAL LOW (ref 8.9–10.3)
Chloride: 101 mmol/L (ref 98–111)
Creatinine, Ser: 4.13 mg/dL — ABNORMAL HIGH (ref 0.61–1.24)
GFR, Estimated: 16 mL/min — ABNORMAL LOW (ref >60)
Glucose, Bld: 144 mg/dL — ABNORMAL HIGH (ref 70–99)
Phosphorus: 6.2 mg/dL — ABNORMAL HIGH (ref 2.5–4.6)
Potassium: 4.5 mmol/L (ref 3.5–5.1)
Sodium: 138 mmol/L (ref 135–145)

## 2023-05-26 LAB — MAGNESIUM: Magnesium: 1.9 mg/dL (ref 1.7–2.4)

## 2023-05-26 LAB — GLUCOSE, CAPILLARY
Glucose-Capillary: 120 mg/dL — ABNORMAL HIGH (ref 70–99)
Glucose-Capillary: 138 mg/dL — ABNORMAL HIGH (ref 70–99)
Glucose-Capillary: 64 mg/dL — ABNORMAL LOW (ref 70–99)
Glucose-Capillary: 72 mg/dL (ref 70–99)
Glucose-Capillary: 302 mg/dL — ABNORMAL HIGH (ref 70–99)

## 2023-05-26 MED ORDER — INSULIN GLARGINE-YFGN 100 UNIT/ML ~~LOC~~ SOLN
30.0000 [IU] | Freq: Two times a day (BID) | SUBCUTANEOUS | Status: DC
  Administered 2023-05-26 – 2023-05-27 (×2): 30 [IU] via SUBCUTANEOUS
  Filled 2023-05-26 (×3): qty 0.3

## 2023-05-26 MED ORDER — INSULIN ASPART 100 UNIT/ML IJ SOLN
7.0000 [IU] | Freq: Three times a day (TID) | INTRAMUSCULAR | Status: DC
  Administered 2023-05-27 (×3): 7 [IU] via SUBCUTANEOUS

## 2023-05-26 MED ORDER — METOPROLOL SUCCINATE ER 25 MG PO TB24
12.5000 mg | ORAL_TABLET | Freq: Every day | ORAL | Status: DC
  Administered 2023-05-26 – 2023-05-29 (×4): 12.5 mg via ORAL
  Filled 2023-05-26 (×4): qty 1

## 2023-05-26 NOTE — Progress Notes (Addendum)
 [facility] consult received to assess bleeding HDC. Upon arrival at bedside, patient with dried blood on his gown. He stated he awakened to feel wetness on his chest and called his nurse. He doesn't remember pulling on the line, but also stated he was asleep and it was a possibility. Old dressing removed with large clot around insertion site, larger on underside of catheter. Stitch intact as well as old hemostatic agent which was unable to be removed. Pressure held on underside and sides of catheter using sterile technique for 5 minutes after site cleaned thoroughly with Chlorhexidine. SecurePort IV applied at insertion site and allowed to dry. Biopatch and new transparent dressing applied. Instructed patient to notify his nurse immediately if he notices any warmth or oozing from chest site. Peoples, MD with nephrology came in to bedside for rounds with patient and was notified of incident. Unit RN updated to reach out to [facility] if any further bleeding from [facility] site noted.  Of note, pt reported he had to return to IR several days ago to have line stitched d/t bleeding.

## 2023-05-26 NOTE — Progress Notes (Signed)
 [facility]                                                                              Dylan Owen, is a 58 y.o. male, DOB - 03-May-1965, QMV:[id number] Admit date - 05/04/2023    Outpatient Primary MD for the patient is McGowen, Maryjean Morn, MD  LOS - 21  days  Chief Complaint  Patient presents with   Shortness of Breath       Brief summary   Patient is a 58 year old male with obesity, HTN, HLP, diabetes mellitus type 2, OSA, chronic back pain, depression, GERD was admitted on 05/04/2023 with cough, sore throat, dizziness, myalgias for a week.  Patient had also reported chest tightness, headaches, chills, diaphoresis with dyspnea on exertion.  BNP was 1275.  Respiratory virus panel negative.  Patient was placed on Lasix.  Troponins mildly elevated, diagnosis of viral myocarditis was made. 2D echo showed EF ~25% although poor acoustic windows, cardiology was consulted. As of 05/06/2023, creatinine started trending up, was transferred to intensive care unit.  He was placed on BiPAP, milrinone.  Patient was intubated on 2/3 and was extubated on 2/5.  Also started on CRRT, currently on intermittent hemodialysis. Patient was transferred out to the floor and TRH assumed care on 05/21/2023  Significant Hospital Events:   1/29 Admit to [facility], diuresis 1/30 ECHO w LVEF 25% 1/31 Cards consult, c/f viral myocarditis  2/1 PCCM consult txf to [facility], Adv HF consult  2/2 For HD cath placement and CRRT. Incr D10 for persistent hypogly   2/3 Milrinone stopped. Weaning NE; intubated due to being on bipap for a few days and needing to go to CT  2/5 Extubated. Cleviprex for hypertension   2/6 CT Head for anisocoria, no acute findings. Off clevi. Adding PRN metop. CRRT UF decr to 150/hr.  2/7 On BiPAP. RHC and DCCV  2/8 Off crrt  2/9 Albumin + lasix challenge. HIT neg   2/10: got iHD 2/12: HD cath pulled by pt overnight. Ordered IR for TDC. Holding bivalirudin  2/13: did not get TDC  yesterday because WBC 14.  TDC on 2/13.   2/14: TDC on 2/13, underwent iHD, out of ICU after dialysis.    Assessment & Plan    Sepsis secondary to ? UTI, multifocal MSSA PNA, POA septic shock-resolved -Presented febrile, TEE showed moderately reduced LVEF.  Initial concern for cardiogenic shock with HFrEF, possible viral myocarditis, sequelae of multisystem organ dysfunction.  Per PCCM, likely septic shock, was placed on broad-spectrum antibiotics with meropenem and improved significantly. -Patient had required vasopressors in ICU, currently off -Respiratory virus panel negative, blood cultures 1/31 negative, urine culture showed multiple species.  -Tracheal aspirate showed rare Candida and Staph aureus/MSSA -Patient has completed 7 days of IV meropenem on 05/14/2023 -Sepsis physiology resolved -Foley catheter removed, voiding successfully    Acute hypoxic and hypercarbic resp failure, resolving MSSA PNA History of obstructive sleep apnea -Secondary to PNA, acute systolic heart failure, was intubated on 2/3, extubated on 2/5.  Pulmonology recommended O2, BiPAP PRN + at bedtime -Hypoxia resolved, O2 sats 100% on room air  -Outpatient sleep study recommended,  has been refusing BiPAP intermittently at night     Acute systolic HFrEF-- septic cardiomyopathy  pHTN (groud II and III) -Initial echo 1/30: Unable to estimate EF due to poor quality - Repeat Echo 2/6 showed EF 40-45%.  - RHC with normal R and L filling pressures, elevated PA mean and PVR. -Volume management with intermittent HD.  Unable to add GDMT due to hypotension. -BP improving, continue midodrine  -outpatient follow-up with Dr. Rennis Golden scheduled  New Afib/flutter s/p DCCV 2/7 -- now sinus rhythm -No further epistaxis, continue eliquis -Maintaining NSR, cardiology recommended reducing amiodarone to 200 mg daily, -Started Toprol-XL 12.5 mg daily   Epistaxis -Improved, not on O2 now -Continue Eliquis as recommended by  cardiology    Acute kidney injury likely ATN AGMA, resolved  -Nephrology following, at baseline creatinine 1.1, worsened due to ATN in the setting of shock. -Placed on CRRT on 2/6.  Status post [facility] on 2/13. -Nephrology following, last HD on 2/17, hold further dialysis for now, creatinine appears to have plateaued and may not need HD.    Diabetes mellitus type 2, uncontrolled with hyperglycemia -Hemoglobin A1c 10.2 on 05/05/2023 CBG (last 3)  Recent Labs    05/25/23 2052 05/26/23 0749 05/26/23 1133  GLUCAP 187* 120* 138*   -CBGs improving, continue Semglee 34 units twice daily, NovoLog 10 units 3 times daily AC, moderate SSI   Obesity class II Estimated body mass index is 39.67 kg/m as calculated from the following:   Height as of this encounter: 6\' 1"  (1.854 m).   Weight as of this encounter: 136.4 kg.  Code Status: Full CODE STATUS DVT Prophylaxis:  Place TED hose Start: 05/17/23 1318 apixaban (ELIQUIS) tablet 5 mg   Level of Care: Level of care: Med-Surg Family Communication: Updated patient Disposition Plan:      Remains inpatient appropriate: CIR once medically ready   Procedures:  Intubation, extubation 2D echo Cardiac cath [facility] placement Hemodialysis  Consultants:   Cardiology, nephrology, CCM  Antimicrobials:   Anti-infectives (From admission, onward)    Start     Dose/Rate Route Frequency Ordered Stop   05/19/23 1505  ceFAZolin (ANCEF) IVPB 2g/100 mL premix        over 30 Minutes Intravenous Continuous PRN 05/19/23 1519 05/19/23 1505   05/12/23 2200  meropenem (MERREM) 1 g in sodium chloride 0.9 % 100 mL IVPB        1 g 200 mL/hr over 30 Minutes Intravenous Every 12 hours 05/12/23 0755 05/14/23 2148   05/10/23 0000  vancomycin (VANCOREADY) IVPB 1500 mg/300 mL  Status:  Discontinued        1,500 mg 150 mL/hr over 120 Minutes Intravenous Every 24 hours 05/09/23 0945 05/10/23 0808   05/09/23 1045  vancomycin (VANCOREADY) IVPB 2000 mg/400 mL        2,000  mg 200 mL/hr over 120 Minutes Intravenous  Once 05/09/23 0945 05/09/23 1222   05/08/23 1600  meropenem (MERREM) 1 g in sodium chloride 0.9 % 100 mL IVPB  Status:  Discontinued        1 g 200 mL/hr over 30 Minutes Intravenous Every 8 hours 05/08/23 1505 05/12/23 0755   05/08/23 1500  piperacillin-tazobactam (ZOSYN) IVPB 3.375 g  Status:  Discontinued        3.375 g 100 mL/hr over 30 Minutes Intravenous Every 6 hours 05/08/23 0833 05/08/23 1444   05/07/23 1400  piperacillin-tazobactam (ZOSYN) IVPB 3.375 g  Status:  Discontinued        3.375 g  12.5 mL/hr over 240 Minutes Intravenous Every 8 hours 05/07/23 0836 05/08/23 0832   05/07/23 1200  ceFEPIme (MAXIPIME) 2 g in sodium chloride 0.9 % 100 mL IVPB  Status:  Discontinued        2 g 200 mL/hr over 30 Minutes Intravenous Every 12 hours 05/06/23 2346 05/07/23 0827   05/07/23 0930  piperacillin-tazobactam (ZOSYN) IVPB 3.375 g        3.375 g 100 mL/hr over 30 Minutes Intravenous  Once 05/07/23 0836 05/07/23 1200   05/07/23 0030  ceFEPIme (MAXIPIME) 2 g in sodium chloride 0.9 % 100 mL IVPB        2 g 200 mL/hr over 30 Minutes Intravenous  Once 05/06/23 2339 05/07/23 0100   05/07/23 0030  metroNIDAZOLE (FLAGYL) IVPB 500 mg  Status:  Discontinued        500 mg 100 mL/hr over 60 Minutes Intravenous Every 12 hours 05/06/23 2339 05/07/23 0827          Medications  amiodarone  200 mg Oral Daily   apixaban  5 mg Oral BID   bacitracin   Topical BID   Chlorhexidine Gluconate Cloth  6 each Topical Daily   famotidine  20 mg Oral Daily   feeding supplement  237 mL Oral BID BM   insulin aspart  0-15 Units Subcutaneous TID WC   insulin aspart  10 Units Subcutaneous TID WC   insulin glargine-yfgn  34 Units Subcutaneous BID   lidocaine  1 patch Transdermal Q24H   methocarbamol  500 mg Oral BID   midodrine  5 mg Oral TID WC   multivitamin  1 tablet Oral QHS   nicotine  14 mg Transdermal Daily   oxyCODONE  10 mg Oral Q6H      Subjective:    Dylan Owen was seen and examined today.  Feels a lot better today, watching TV.  No acute fevers, nausea vomiting, chest pain or shortness of breath, no epistaxis or hematuria.  Very happy that he may not need further HD.  Objective:   Vitals:   05/25/23 1631 05/25/23 2049 05/26/23 0551 05/26/23 0748  BP: (!) 120/55 135/63 (!) 110/59 (!) 122/59  Pulse: (!) 103 (!) 101 94 99  Resp: 20 18 18 19   Temp: 98.5 F (36.9 C) (!) 97.4 F (36.3 C) 97.6 F (36.4 C) 97.7 F (36.5 C)  TempSrc: Oral Oral Oral Oral  SpO2:  96% 93% 100%  Weight:      Height:        Intake/Output Summary (Last 24 hours) at 05/26/2023 1250 Last data filed at 05/26/2023 1217 Gross per 24 hour  Intake 840 ml  Output 2325 ml  Net -1485 ml     Wt Readings from Last 3 Encounters:  05/23/23 (!) 136.4 kg  05/04/23 (!) 148.8 kg  12/09/22 (!) 153.3 kg    Physical Exam General: Alert and oriented x 3, NAD Cardiovascular: S1 S2 clear, RRR.  Respiratory: Decreased breath sound at the bases Gastrointestinal: Soft, nontender, nondistended, NBS Ext: 1+ pedal edema bilaterally Neuro: no new deficits Skin: No rashes Psych: Normal affect     Data Reviewed:  I have personally reviewed following labs    CBC Lab Results  Component Value Date   WBC 11.7 (H) 05/23/2023   RBC 3.84 (L) 05/23/2023   HGB 10.1 (L) 05/23/2023   HCT 31.8 (L) 05/23/2023   MCV 82.8 05/23/2023   MCH 26.3 05/23/2023   PLT 315 05/23/2023   MCHC 31.8 05/23/2023  RDW 17.0 (H) 05/23/2023   LYMPHSABS 0.7 05/06/2023   MONOABS 2.3 (H) 05/06/2023   EOSABS 0.0 05/06/2023   BASOSABS 0.1 05/06/2023     Last metabolic panel Lab Results  Component Value Date   NA 138 05/26/2023   K 4.5 05/26/2023   CL 101 05/26/2023   CO2 24 05/26/2023   BUN 107 (H) 05/26/2023   CREATININE 4.13 (H) 05/26/2023   GLUCOSE 144 (H) 05/26/2023   GFRNONAA 16 (L) 05/26/2023   GFRAA >90 10/09/2011   CALCIUM 8.7 (L) 05/26/2023   PHOS 6.2 (H) 05/26/2023    PROT 5.9 (L) 05/16/2023   ALBUMIN 2.3 (L) 05/26/2023   BILITOT 2.0 (H) 05/16/2023   ALKPHOS 85 05/16/2023   AST 35 05/16/2023   ALT 40 05/16/2023   ANIONGAP 13 05/26/2023    CBG (last 3)  Recent Labs    05/25/23 2052 05/26/23 0749 05/26/23 1133  GLUCAP 187* 120* 138*      Coagulation Profile: No results for input(s): "INR", "PROTIME" in the last 168 hours.    Radiology Studies: I have personally reviewed the imaging studies  No results found.      Thad Ranger M.D. [facility] 05/26/2023, 12:50 PM  Available via Epic secure chat 7am-7pm After 7 pm, please refer to night coverage provider listed on amion.

## 2023-05-26 NOTE — Progress Notes (Signed)
 Patient ID: Dylan Owen, male   DOB: 08-27-1965, 57 y.o.   MRN: [id number] [facility] Progress Note   Assessment/ Plan:   1. Acute kidney Injury: With essentially normal renal function at baseline (creatinine 1.1) and suffered ATN in the setting of shock.  CRRT early on.  Seems to be in a state of recovery.  BUN and creatinine seem to be plateauing - s/p [facility] 2/13  - HD 2/17; hold further dialysis for now -Continue to monitor for signs of recovery  2.  Mixed cardiogenic/septic shock: Clinically suspected to be associated with viral myocarditis/acute CHF exacerbation.  Urine output improving.  Continue to monitor for recovery.  Lasix if needed 3.  Shock liver: Transaminases appear to be trending down with hemodynamic support.   4.  Acute hypoxic/hypercarbic respiratory failure: Now improved with ultrafiltration 5. Anemia-hemoglobin 10.1.  Continue to monitor 6.  Uncontrolled type 2 diabetes with hyperglycemia.  Management per primary team   Subjective:   Patient's numbers seem to have plateaued and slightly improved today.  Good urine output.  Patient has no complaints.  Did have some bleeding from his catheter overnight.   Objective:   BP (!) 122/59 (BP Location: Left Arm)   Pulse 99   Temp 97.7 F (36.5 C) (Oral)   Resp 19   Ht 6\' 1"  (1.854 m)   Wt (!) 136.4 kg   SpO2 100%   BMI 39.67 kg/m   Intake/Output Summary (Last 24 hours) at 05/26/2023 1118 Last data filed at 05/26/2023 4098 Gross per 24 hour  Intake 1080 ml  Output 1200 ml  Net -120 ml   Weight change:   Physical Exam: Gen: Lying in bed, no distress CVS: Tachycardia, no rub Resp: Bilateral chest rise with no increased work of breathing Abd: Soft, obese, nontender, bowel sounds normal Ext: Trace edema in the bilateral lower extremities, warm and well-perfused  Imaging: No results found.    Labs: BMET Recent Labs  Lab 05/20/23 0432 05/21/23 0458 05/22/23 0316 05/23/23 0316  05/24/23 0324 05/25/23 0335 05/26/23 0354  NA 128* 130* 131* 133* 133* 139 138  K 5.5* 5.1 5.1 5.5* 5.0 4.8 4.5  CL 93* 92* 93* 94* 94* 101 101  CO2 21* 25 24 24 27 26 24   GLUCOSE 351* 146* 205* 324* 333* 148* 144*  BUN 118* 75* 98* 124* 77* 100* 107*  CREATININE 6.96* 4.99* 5.55* 6.03* 4.13* 4.51* 4.13*  CALCIUM 8.7* 8.5* 8.9 8.7* 8.8* 9.2 8.7*  PHOS 4.6 4.5 5.1* 5.8* 4.2 5.6* 6.2*   CBC Recent Labs  Lab 05/20/23 0432 05/21/23 0634 05/22/23 0316 05/23/23 0316  WBC 14.9* 12.1* 11.7* 11.7*  HGB 12.4* 11.0* 10.3* 10.1*  HCT 37.8* 34.6* 31.9* 31.8*  MCV 80.3 82.2 82.0 82.8  PLT 334 308 299 315    Medications:     amiodarone  200 mg Oral Daily   apixaban  5 mg Oral BID   bacitracin   Topical BID   Chlorhexidine Gluconate Cloth  6 each Topical Daily   famotidine  20 mg Oral Daily   feeding supplement  237 mL Oral BID BM   insulin aspart  0-15 Units Subcutaneous TID WC   insulin aspart  10 Units Subcutaneous TID WC   insulin glargine-yfgn  34 Units Subcutaneous BID   lidocaine  1 patch Transdermal Q24H   methocarbamol  500 mg Oral BID   midodrine  5 mg Oral TID WC   multivitamin  1 tablet Oral QHS  nicotine  14 mg Transdermal Daily   oxyCODONE  10 mg Oral Q6H    Darnell Level  05/26/2023, 11:18 AM

## 2023-05-26 NOTE — Progress Notes (Signed)
 Mobility Specialist Progress Note:    05/26/23 1300  Mobility  Activity Ambulated with assistance in room  Level of Assistance Contact guard assist, steadying assist  Assistive Device Front wheel walker  Distance Ambulated (ft) 10 ft  Activity Response Tolerated well  Mobility Referral Yes  Mobility visit 1 Mobility  Mobility Specialist Start Time (ACUTE ONLY) 1341  Mobility Specialist Stop Time (ACUTE ONLY) 1349  Mobility Specialist Time Calculation (min) (ACUTE ONLY) 8 min   Pt received in BR, requesting assistance back to bed. Able to stand w/ SV and MS assisted w/ peri care. Declined further ambulation d/t fatigue and BLE weakness. Pt left on EOB with call bell and all needs met.  D'Vante Earlene Plater Mobility Specialist Please contact via Special educational needs teacher or Rehab office at [phone]

## 2023-05-26 NOTE — Progress Notes (Signed)
 IP rehab admissions - Noted PT recommendations changed to home with [facility].  I met with patient and explained that he is doing well and likely will not need an inpatient rehab stay.  Likely can DC home with [facility] once he is medically ready.  [phone]

## 2023-05-26 NOTE — Progress Notes (Signed)
 Hypoglycemic Event  CBG: 64  Treatment: 4 oz juice/soda  Symptoms: hungry  Follow-up CBG: Time: 1806 CBG Result: 72  Possible Reasons for Event: medication regimen  Comments/MD notified: Secure chat send to MD Rai   Boykin Nearing

## 2023-05-26 NOTE — TOC Progression Note (Signed)
 Transition of Care [facility]) - Progression Note    Patient Details  Name: Dylan Owen MRN: [id number] Date of Birth: [birth year]  [facility] CM/SW Contact  Tom-Johnson, Hershal Coria, RN Phone Number: 05/26/2023, 3:16 PM  Clinical Narrative:     CM spoke with patient at bedside about discharge disposition.  Patient has no preference for Home health. CM verified Referral called in to [facility] by previous CM, [facility] voiced acceptance, info on AVS. Patient states he has all necessary DME's at home, requesting a Bariatric rollator. Patient does not qualify for a Bariatric rollator d/t his weight.  Patient's inpatient HD on hold, Creatinine improving. Nephrology following.  CM will continue to follow as patient progresses with care towards discharge.             Expected Discharge Plan: Home w Home Health Services Barriers to Discharge: Continued Medical Work up  Expected Discharge Plan and Services   Discharge Planning Services: CM Consult Post Acute Care Choice: Home Health Living arrangements for the past 2 months: Single Family Home                           HH Arranged: RN, PT [facility] Agency: [facility] Date [facility] Agency Contacted: 05/16/23 Time HH Agency Contacted: 1555 Representative spoke with at [facility] Agency: Lorenza Chick   Social Determinants of Health (SDOH) Interventions SDOH Screenings   Food Insecurity: No Food Insecurity (05/05/2023)  Recent Concern: Food Insecurity - Food Insecurity Present (05/05/2023)  Housing: Low Risk  (05/05/2023)  Transportation Needs: No Transportation Needs (05/05/2023)  Utilities: Not At Risk (05/05/2023)  Alcohol Screen: Low Risk  (11/05/2020)  Depression (PHQ2-9): High Risk (11/26/2022)  Financial Resource Strain: High Risk (12/05/2022)  Physical Activity: Unknown (12/05/2022)  Social Connections: Moderately Isolated (05/05/2023)  Stress: Stress Concern Present (12/05/2022)  Tobacco Use: High Risk (05/05/2023)     Readmission Risk Interventions     No data to display

## 2023-05-27 DIAGNOSIS — I5021 Acute systolic (congestive) heart failure: Secondary | ICD-10-CM | POA: Diagnosis not present

## 2023-05-27 LAB — RENAL FUNCTION PANEL
Albumin: 2.3 g/dL — ABNORMAL LOW (ref 3.5–5.0)
Anion gap: 14 (ref 5–15)
BUN: 100 mg/dL — ABNORMAL HIGH (ref 6–20)
CO2: 25 mmol/L (ref 22–32)
Calcium: 9 mg/dL (ref 8.9–10.3)
Chloride: 101 mmol/L (ref 98–111)
Creatinine, Ser: 3.82 mg/dL — ABNORMAL HIGH (ref 0.61–1.24)
GFR, Estimated: 18 mL/min — ABNORMAL LOW (ref >60)
Glucose, Bld: 299 mg/dL — ABNORMAL HIGH (ref 70–99)
Phosphorus: 5.4 mg/dL — ABNORMAL HIGH (ref 2.5–4.6)
Potassium: 4.8 mmol/L (ref 3.5–5.1)
Sodium: 140 mmol/L (ref 135–145)

## 2023-05-27 LAB — MAGNESIUM: Magnesium: 2 mg/dL (ref 1.7–2.4)

## 2023-05-27 LAB — GLUCOSE, CAPILLARY
Glucose-Capillary: 104 mg/dL — ABNORMAL HIGH (ref 70–99)
Glucose-Capillary: 123 mg/dL — ABNORMAL HIGH (ref 70–99)
Glucose-Capillary: 188 mg/dL — ABNORMAL HIGH (ref 70–99)
Glucose-Capillary: 195 mg/dL — ABNORMAL HIGH (ref 70–99)
Glucose-Capillary: 198 mg/dL — ABNORMAL HIGH (ref 70–99)
Glucose-Capillary: 48 mg/dL — ABNORMAL LOW (ref 70–99)
Glucose-Capillary: 72 mg/dL (ref 70–99)

## 2023-05-27 MED ORDER — INSULIN GLARGINE-YFGN 100 UNIT/ML ~~LOC~~ SOLN
20.0000 [IU] | Freq: Two times a day (BID) | SUBCUTANEOUS | Status: DC
  Administered 2023-05-27: 20 [IU] via SUBCUTANEOUS
  Filled 2023-05-27 (×3): qty 0.2

## 2023-05-27 NOTE — Plan of Care (Signed)
 Problem: Skin Integrity: Goal: Risk for impaired skin integrity will decrease Outcome: Progressing   Problem: Health Behavior/Discharge Planning: Goal: Ability to manage health-related needs will improve Outcome: Progressing   Problem: Clinical Measurements: Goal: Ability to maintain clinical measurements within normal limits will improve Outcome: Progressing Goal: Will remain free from infection Outcome: Progressing Goal: Respiratory complications will improve Outcome: Progressing Goal: Cardiovascular complication will be avoided Outcome: Progressing   Problem: Activity: Goal: Risk for activity intolerance will decrease Outcome: Progressing   Problem: Nutrition: Goal: Adequate nutrition will be maintained Outcome: Progressing   Problem: Coping: Goal: Level of anxiety will decrease Outcome: Progressing   Problem: Elimination: Goal: Will not experience complications related to bowel motility Outcome: Progressing Goal: Will not experience complications related to urinary retention Outcome: Progressing   Problem: Pain Managment: Goal: General experience of comfort will improve and/or be controlled Outcome: Progressing   Problem: Safety: Goal: Ability to remain free from injury will improve Outcome: Progressing   Problem: Skin Integrity: Goal: Risk for impaired skin integrity will decrease Outcome: Progressing   Problem: Education: Goal: Knowledge of General Education information will improve Description: Including pain rating scale, medication(s)/side effects and non-pharmacologic comfort measures Outcome: Progressing   Problem: Health Behavior/Discharge Planning: Goal: Ability to manage health-related needs will improve Outcome: Progressing   Problem: Clinical Measurements: Goal: Ability to maintain clinical measurements within normal limits will improve Outcome: Progressing Goal: Will remain free from infection Outcome: Progressing Goal: Diagnostic test  results will improve Outcome: Progressing Goal: Respiratory complications will improve Outcome: Progressing Goal: Cardiovascular complication will be avoided Outcome: Progressing   Problem: Activity: Goal: Risk for activity intolerance will decrease Outcome: Progressing   Problem: Nutrition: Goal: Adequate nutrition will be maintained Outcome: Progressing   Problem: Coping: Goal: Level of anxiety will decrease Outcome: Progressing   Problem: Elimination: Goal: Will not experience complications related to bowel motility Outcome: Progressing Goal: Will not experience complications related to urinary retention Outcome: Progressing   Problem: Pain Managment: Goal: General experience of comfort will improve and/or be controlled Outcome: Progressing   Problem: Safety: Goal: Ability to remain free from injury will improve Outcome: Progressing   Problem: Skin Integrity: Goal: Risk for impaired skin integrity will decrease Outcome: Progressing   Problem: Education: Goal: Knowledge of disease and its progression will improve Outcome: Progressing   Problem: Health Behavior/Discharge Planning: Goal: Ability to manage health-related needs will improve Outcome: Progressing   Problem: Clinical Measurements: Goal: Complications related to the disease process or treatment will be avoided or minimized Outcome: Progressing Goal: Dialysis access will remain free of complications Outcome: Progressing   Problem: Activity: Goal: Activity intolerance will improve Outcome: Progressing   Problem: Fluid Volume: Goal: Fluid volume balance will be maintained or improved Outcome: Progressing   Problem: Nutritional: Goal: Ability to make appropriate dietary choices will improve Outcome: Progressing   Problem: Respiratory: Goal: Respiratory symptoms related to disease process will be avoided Outcome: Progressing   Problem: Self-Concept: Goal: Body image disturbance will be avoided  or minimized Outcome: Progressing   Problem: Urinary Elimination: Goal: Progression of disease will be identified and treated Outcome: Progressing   Problem: Education: Goal: Understanding of CV disease, CV risk reduction, and recovery process will improve Outcome: Progressing Goal: Individualized Educational Video(s) Outcome: Progressing   Problem: Activity: Goal: Ability to return to baseline activity level will improve Outcome: Progressing   Problem: Cardiovascular: Goal: Ability to achieve and maintain adequate cardiovascular perfusion will improve Outcome: Progressing Goal: Vascular access site(s) Level 0-1 will be maintained  Outcome: Progressing

## 2023-05-27 NOTE — Progress Notes (Signed)
 Mobility Specialist Progress Note:    05/27/23 1300  Mobility  Activity Ambulated with assistance in hallway  Level of Assistance Contact guard assist, steadying assist  Assistive Device Front wheel walker  Distance Ambulated (ft) 100 ft  Activity Response Tolerated well  Mobility Referral Yes  Mobility visit 1 Mobility  Mobility Specialist Start Time (ACUTE ONLY) 1325  Mobility Specialist Stop Time (ACUTE ONLY) 1345  Mobility Specialist Time Calculation (min) (ACUTE ONLY) 20 min   Pt received in bed and agreeable. No physical assistance needed to come EOB and stand. Used chair follow as pt took x2 seated rest breaks.  Some DOE during ambulation, managed w/ pursed lipped breathing. Returned to room w/o fault. Pt left in chair with call bell and all needs met.  D'Vante Earlene Plater Mobility Specialist Please contact via Special educational needs teacher or Rehab office at [phone]

## 2023-05-27 NOTE — Progress Notes (Signed)
 Patient ID: Dylan Owen, male   DOB: 01/04/1966, 58 y.o.   MRN: [id number] [facility] Progress Note   Assessment/ Plan:   1. Acute kidney Injury: With essentially normal renal function at baseline (creatinine 1.1) and suffered ATN in the setting of shock.  CRRT early on.  Now on state of recovery - s/p [facility] 2/13  - HD 2/17; hold further dialysis for now -Continue to monitor for signs of recovery -If creatinine and BUN improve tomorrow would likely remove dialysis catheter  2.  Mixed cardiogenic/septic shock: Clinically suspected to be associated with viral myocarditis/acute CHF exacerbation.  Urine output improving.  Continue to monitor for recovery.  Lasix if needed 3.  Shock liver: Has improved with hemodynamic support and time 4.  Acute hypoxic/hypercarbic respiratory failure: Now improved with ultrafiltration 5. Anemia-hemoglobin 10.1.  Continue to monitor 6.  Uncontrolled type 2 diabetes with hyperglycemia.  Management per primary team   Subjective:   Patient feels well today with no complaints.  Great urine output.  Creatinine improving.  BUN slightly down.   Objective:   BP (!) 118/56 (BP Location: Left Arm)   Pulse 99   Temp 98.6 F (37 C) (Oral)   Resp 17   Ht 6\' 1"  (1.854 m)   Wt (!) 136.4 kg   SpO2 94%   BMI 39.67 kg/m   Intake/Output Summary (Last 24 hours) at 05/27/2023 1219 Last data filed at 05/27/2023 0756 Gross per 24 hour  Intake 240 ml  Output 1350 ml  Net -1110 ml   Weight change:   Physical Exam: Gen: Lying in bed, no distress CVS: Tachycardia, no rub Resp: Bilateral chest rise with no increased work of breathing Abd: Soft, obese, nontender, bowel sounds normal Ext: Trace edema in the bilateral lower extremities, warm and well-perfused  Imaging: No results found.    Labs: BMET Recent Labs  Lab 05/21/23 0458 05/22/23 0316 05/23/23 0316 05/24/23 0324 05/25/23 0335 05/26/23 0354 05/27/23 0503  NA 130* 131* 133* 133*  139 138 140  K 5.1 5.1 5.5* 5.0 4.8 4.5 4.8  CL 92* 93* 94* 94* 101 101 101  CO2 25 24 24 27 26 24 25   GLUCOSE 146* 205* 324* 333* 148* 144* 299*  BUN 75* 98* 124* 77* 100* 107* 100*  CREATININE 4.99* 5.55* 6.03* 4.13* 4.51* 4.13* 3.82*  CALCIUM 8.5* 8.9 8.7* 8.8* 9.2 8.7* 9.0  PHOS 4.5 5.1* 5.8* 4.2 5.6* 6.2* 5.4*   CBC Recent Labs  Lab 05/21/23 0634 05/22/23 0316 05/23/23 0316  WBC 12.1* 11.7* 11.7*  HGB 11.0* 10.3* 10.1*  HCT 34.6* 31.9* 31.8*  MCV 82.2 82.0 82.8  PLT 308 299 315    Medications:     amiodarone  200 mg Oral Daily   apixaban  5 mg Oral BID   bacitracin   Topical BID   Chlorhexidine Gluconate Cloth  6 each Topical Daily   famotidine  20 mg Oral Daily   feeding supplement  237 mL Oral BID BM   insulin aspart  0-15 Units Subcutaneous TID WC   insulin aspart  7 Units Subcutaneous TID WC   insulin glargine-yfgn  20 Units Subcutaneous BID   lidocaine  1 patch Transdermal Q24H   methocarbamol  500 mg Oral BID   metoprolol succinate  12.5 mg Oral Daily   midodrine  5 mg Oral TID WC   multivitamin  1 tablet Oral QHS   nicotine  14 mg Transdermal Daily   oxyCODONE  10  mg Oral Q6H    Darnell Level  05/27/2023, 12:19 PM

## 2023-05-27 NOTE — Progress Notes (Signed)
 Dylan NOTE    BRANDUN Owen  ZOX:[id number] DOB: 08-Jan-1966 DOA: 05/04/2023 PCP: Jeoffrey Massed, MD   Brief Narrative:  58 year old male with obesity, HTN, HLP, diabetes mellitus type 2, OSA, chronic back pain, depression, GERD was admitted on 05/04/2023 with cough, sore throat, dizziness, myalgias for a week.  Patient had also reported chest tightness, headaches, chills, diaphoresis with dyspnea on exertion.  BNP was 1275.  Respiratory virus panel negative.  Patient was placed on Lasix.  Troponins mildly elevated, diagnosis of viral myocarditis was made. 2D echo showed EF ~25% although poor acoustic windows, cardiology was consulted. As of 05/06/2023, creatinine started trending up, was transferred to intensive care unit.  He was placed on BiPAP, milrinone.  Patient was intubated on 2/3 and was extubated on 2/5.  Also started on CRRT, currently on intermittent hemodialysis. Patient was transferred out to the floor and TRH assumed care on 05/21/2023  Significant Hospital Events:    1/29 Admit to [facility], diuresis 1/30 ECHO w LVEF 25% 1/31 Cards consult, c/f viral myocarditis  2/1 PCCM consult txf to [facility], Adv HF consult  2/2 For HD cath placement and CRRT. Incr D10 for persistent hypogly   2/3 Milrinone stopped. Weaning NE; intubated due to being on bipap for a few days and needing to go to CT  2/5 Extubated. Cleviprex for hypertension   2/6 CT Head for anisocoria, no acute findings. Off clevi. Adding PRN metop. CRRT UF decr to 150/hr.  2/7 On BiPAP. RHC and DCCV  2/8 Off crrt  2/9 Albumin + lasix challenge. HIT neg   2/10: got iHD 2/12: HD cath pulled by pt overnight. Ordered IR for TDC. Holding bivalirudin  2/13: did not get TDC yesterday because WBC 14.  TDC on 2/13.   2/14: TDC on 2/13, underwent iHD, out of ICU after dialysis.   Assessment & Plan:   Sepsis secondary to ? UTI, multifocal MSSA pneumonia: Present on admission septic shock-resolved Acute urinary  retention -Presented febrile, TEE showed moderately reduced LVEF.  Initial concern for cardiogenic shock with HFrEF, possible viral myocarditis, sequelae of multisystem organ dysfunction.  Per PCCM, likely septic shock, was placed on broad-spectrum antibiotics with meropenem and improved significantly. -Patient had required vasopressors in ICU, currently off -Respiratory virus panel negative, blood cultures 1/31 negative, urine culture showed multiple species.  -Tracheal aspirate showed rare Candida and Staph aureus/MSSA -Patient has completed 7 days of IV meropenem on 05/14/2023 -Sepsis physiology resolved -Foley catheter removed, voiding successfully   Acute hypoxic and hypercarbic respiratory failure MSSA pneumonia History of obstructive sleep apnea -Secondary to PNA, acute systolic heart failure, was intubated on 2/3, extubated on 2/5.  Pulmonology recommended O2, BiPAP PRN + at bedtime -Hypoxia resolved.  Currently on room air. -Outpatient sleep study recommended, has been refusing BiPAP intermittently at night    Acute systolic HFrEF-- septic cardiomyopathy  Pulmonary HTN (group II and III) -Initial echo 1/30: Unable to estimate EF due to poor quality - Repeat Echo 2/6 showed EF 40-45%.  - RHC with normal R and L filling pressures, elevated PA mean and PVR. -Volume management with intermittent HD.  Unable to add GDMT due to hypotension. -BP improving, continue midodrine.  Continue metoprolol succinate -Cardiology has signed off.  Outpatient follow-up with Dr. Rennis Golden scheduled   New Afib/flutter s/p DCCV 2/7 -- now sinus rhythm -No further epistaxis, continue eliquis -Maintaining NSR, cardiology recommended reducing amiodarone to 200 mg daily, -Continue metoprolol succinate.  Currently rate controlled   epistaxis -Improved,  not on O2 now -Continue Eliquis as recommended by cardiology    Acute kidney injury likely ATN AGMA, resolved  -Nephrology following, at baseline creatinine  1.1, worsened due to ATN in the setting of shock. -Placed on CRRT on 2/6.  Status post [facility] on 2/13. -Nephrology following, last HD on 2/17, hold further dialysis for now, creatinine appears to have plateaued and may not need HD.  Creatinine 3.82 today.  Patient is making urine.   Diabetes mellitus type 2, uncontrolled with hyperglycemia and hypoglycemia -Hemoglobin A1c 10.2 on 05/05/2023 -Has had intermittent hypoglycemia.  Decrease long-acting insulin and short acting insulin with meals.  Continue CBGs with SSI.    Obesity class II -Outpatient follow-up  Leukocytosis -Mild.  No recent labs.  Hyponatremia -Resolved  Anemia of chronic disease Hemoglobin was stable on 05/23/2023.  Monitor intermittently.  Hyperkalemia -Resolved  Physical deconditioning -PT initially recommended inpatient rehab but as per CIR, patient will not need CIR.  PT now recommending home health PT.  TOC following.   DVT prophylaxis: Eliquis Code Status: Full Family Communication: None at bedside Disposition Plan: Status is: Inpatient Remains inpatient appropriate because: Of severity of illness   Consultants: PCCM/cardiology/nephrology/IR  Procedures: As above  Antimicrobials:  Anti-infectives (From admission, onward)    Start     Dose/Rate Route Frequency Ordered Stop   05/19/23 1505  ceFAZolin (ANCEF) IVPB 2g/100 mL premix        over 30 Minutes Intravenous Continuous PRN 05/19/23 1519 05/19/23 1505   05/12/23 2200  meropenem (MERREM) 1 g in sodium chloride 0.9 % 100 mL IVPB        1 g 200 mL/hr over 30 Minutes Intravenous Every 12 hours 05/12/23 0755 05/14/23 2148   05/10/23 0000  vancomycin (VANCOREADY) IVPB 1500 mg/300 mL  Status:  Discontinued        1,500 mg 150 mL/hr over 120 Minutes Intravenous Every 24 hours 05/09/23 0945 05/10/23 0808   05/09/23 1045  vancomycin (VANCOREADY) IVPB 2000 mg/400 mL        2,000 mg 200 mL/hr over 120 Minutes Intravenous  Once 05/09/23 0945 05/09/23 1222    05/08/23 1600  meropenem (MERREM) 1 g in sodium chloride 0.9 % 100 mL IVPB  Status:  Discontinued        1 g 200 mL/hr over 30 Minutes Intravenous Every 8 hours 05/08/23 1505 05/12/23 0755   05/08/23 1500  piperacillin-tazobactam (ZOSYN) IVPB 3.375 g  Status:  Discontinued        3.375 g 100 mL/hr over 30 Minutes Intravenous Every 6 hours 05/08/23 0833 05/08/23 1444   05/07/23 1400  piperacillin-tazobactam (ZOSYN) IVPB 3.375 g  Status:  Discontinued        3.375 g 12.5 mL/hr over 240 Minutes Intravenous Every 8 hours 05/07/23 0836 05/08/23 0832   05/07/23 1200  ceFEPIme (MAXIPIME) 2 g in sodium chloride 0.9 % 100 mL IVPB  Status:  Discontinued        2 g 200 mL/hr over 30 Minutes Intravenous Every 12 hours 05/06/23 2346 05/07/23 0827   05/07/23 0930  piperacillin-tazobactam (ZOSYN) IVPB 3.375 g        3.375 g 100 mL/hr over 30 Minutes Intravenous  Once 05/07/23 0836 05/07/23 1200   05/07/23 0030  ceFEPIme (MAXIPIME) 2 g in sodium chloride 0.9 % 100 mL IVPB        2 g 200 mL/hr over 30 Minutes Intravenous  Once 05/06/23 2339 05/07/23 0100   05/07/23 0030  metroNIDAZOLE (FLAGYL) IVPB  500 mg  Status:  Discontinued        500 mg 100 mL/hr over 60 Minutes Intravenous Every 12 hours 05/06/23 2339 05/07/23 0827        Subjective: Patient seen and examined at bedside.  Feels slightly better, continues to make urine.  Denies worsening shortness of breath, chest pain or fever.  Objective: Vitals:   05/26/23 1719 05/26/23 2130 05/27/23 0455 05/27/23 0756  BP: (!) 158/76 124/66 (!) 111/49 (!) 118/56  Pulse: 80 99 95 99  Resp:  18 18 17   Temp:  97.9 F (36.6 C) 98 F (36.7 C) 98.6 F (37 C)  TempSrc:  Oral Oral Oral  SpO2: 90% 94% 97% 94%  Weight:      Height:        Intake/Output Summary (Last 24 hours) at 05/27/2023 1031 Last data filed at 05/27/2023 0756 Gross per 24 hour  Intake 240 ml  Output 2475 ml  Net -2235 ml   Filed Weights   05/20/23 1144 05/21/23 0555 05/23/23 0500   Weight: 130.3 kg 132.9 kg (!) 136.4 kg    Examination:  General exam: Appears calm and comfortable.  On room air.  Looks chronically ill and deconditioned Respiratory system: Bilateral decreased breath sounds at bases with scattered crackles Cardiovascular system: S1 & S2 heard, Rate controlled Gastrointestinal system: Abdomen is morbidly obese, nondistended, soft and nontender. Normal bowel sounds heard. Extremities: No cyanosis, clubbing; bilateral lower extremity edema present Central nervous system: Alert and oriented. No focal neurological deficits. Moving extremities Skin: No rashes, lesions or ulcers Psychiatry: Flat affect.  Not agitated.    Data Reviewed: I have personally reviewed following labs and imaging studies  CBC: Recent Labs  Lab 05/21/23 0634 05/22/23 0316 05/23/23 0316  WBC 12.1* 11.7* 11.7*  HGB 11.0* 10.3* 10.1*  HCT 34.6* 31.9* 31.8*  MCV 82.2 82.0 82.8  PLT 308 299 315   Basic Metabolic Panel: Recent Labs  Lab 05/23/23 0316 05/24/23 0324 05/25/23 0335 05/26/23 0354 05/27/23 0503  NA 133* 133* 139 138 140  K 5.5* 5.0 4.8 4.5 4.8  CL 94* 94* 101 101 101  CO2 24 27 26 24 25   GLUCOSE 324* 333* 148* 144* 299*  BUN 124* 77* 100* 107* 100*  CREATININE 6.03* 4.13* 4.51* 4.13* 3.82*  CALCIUM 8.7* 8.8* 9.2 8.7* 9.0  MG 2.2 1.9 2.0 1.9 2.0  PHOS 5.8* 4.2 5.6* 6.2* 5.4*   GFR: Estimated Creatinine Clearance: 30.9 mL/min (A) (by C-G formula based on SCr of 3.82 mg/dL (H)). Liver Function Tests: Recent Labs  Lab 05/23/23 0316 05/24/23 0324 05/25/23 0335 05/26/23 0354 05/27/23 0503  ALBUMIN 2.2* 2.3* 2.4* 2.3* 2.3*   No results for input(s): "LIPASE", "AMYLASE" in the last 168 hours. No results for input(s): "AMMONIA" in the last 168 hours. Coagulation Profile: No results for input(s): "INR", "PROTIME" in the last 168 hours. Cardiac Enzymes: No results for input(s): "CKTOTAL", "CKMB", "CKMBINDEX", "TROPONINI" in the last 168 hours. BNP  (last 3 results) No results for input(s): "PROBNP" in the last 8760 hours. HbA1C: No results for input(s): "HGBA1C" in the last 72 hours. CBG: Recent Labs  Lab 05/26/23 1718 05/26/23 1805 05/26/23 2133 05/27/23 0757 05/27/23 0826  GLUCAP 64* 72 302* 195* 188*   Lipid Profile: No results for input(s): "CHOL", "HDL", "LDLCALC", "TRIG", "CHOLHDL", "LDLDIRECT" in the last 72 hours. Thyroid Function Tests: No results for input(s): "TSH", "T4TOTAL", "FREET4", "T3FREE", "THYROIDAB" in the last 72 hours. Anemia Panel: No results for input(s): "VITAMINB12", "  FOLATE", "FERRITIN", "TIBC", "IRON", "RETICCTPCT" in the last 72 hours. Sepsis Labs: No results for input(s): "PROCALCITON", "LATICACIDVEN" in the last 168 hours.  No results found for this or any previous visit (from the past 240 hours).       Radiology Studies: No results found.      Scheduled Meds:  amiodarone  200 mg Oral Daily   apixaban  5 mg Oral BID   bacitracin   Topical BID   Chlorhexidine Gluconate Cloth  6 each Topical Daily   famotidine  20 mg Oral Daily   feeding supplement  237 mL Oral BID BM   insulin aspart  0-15 Units Subcutaneous TID WC   insulin aspart  7 Units Subcutaneous TID WC   insulin glargine-yfgn  30 Units Subcutaneous BID   lidocaine  1 patch Transdermal Q24H   methocarbamol  500 mg Oral BID   metoprolol succinate  12.5 mg Oral Daily   midodrine  5 mg Oral TID WC   multivitamin  1 tablet Oral QHS   nicotine  14 mg Transdermal Daily   oxyCODONE  10 mg Oral Q6H   Continuous Infusions:        Glade Lloyd, MD [facility] 05/27/2023, 10:31 AM

## 2023-05-28 ENCOUNTER — Other Ambulatory Visit: Payer: Self-pay | Admitting: Student

## 2023-05-28 DIAGNOSIS — I5021 Acute systolic (congestive) heart failure: Secondary | ICD-10-CM | POA: Diagnosis not present

## 2023-05-28 DIAGNOSIS — N179 Acute kidney failure, unspecified: Secondary | ICD-10-CM

## 2023-05-28 LAB — RENAL FUNCTION PANEL
Albumin: 2.4 g/dL — ABNORMAL LOW (ref 3.5–5.0)
Anion gap: 19 — ABNORMAL HIGH (ref 5–15)
BUN: 96 mg/dL — ABNORMAL HIGH (ref 6–20)
CO2: 26 mmol/L (ref 22–32)
Calcium: 10 mg/dL (ref 8.9–10.3)
Chloride: 97 mmol/L — ABNORMAL LOW (ref 98–111)
Creatinine, Ser: 3.53 mg/dL — ABNORMAL HIGH (ref 0.61–1.24)
GFR, Estimated: 19 mL/min — ABNORMAL LOW (ref >60)
Glucose, Bld: 298 mg/dL — ABNORMAL HIGH (ref 70–99)
Phosphorus: 5.3 mg/dL — ABNORMAL HIGH (ref 2.5–4.6)
Potassium: 4.6 mmol/L (ref 3.5–5.1)
Sodium: 142 mmol/L (ref 135–145)

## 2023-05-28 LAB — CBC WITH DIFFERENTIAL/PLATELET
Abs Immature Granulocytes: 0.17 10*3/uL — ABNORMAL HIGH (ref 0.00–0.07)
Basophils Absolute: 0.1 10*3/uL (ref 0.0–0.1)
Basophils Relative: 1 %
Eosinophils Absolute: 0.4 10*3/uL (ref 0.0–0.5)
Eosinophils Relative: 5 %
HCT: 29.2 % — ABNORMAL LOW (ref 39.0–52.0)
Hemoglobin: 9.2 g/dL — ABNORMAL LOW (ref 13.0–17.0)
Immature Granulocytes: 2 %
Lymphocytes Relative: 15 %
Lymphs Abs: 1.3 10*3/uL (ref 0.7–4.0)
MCH: 27.3 pg (ref 26.0–34.0)
MCHC: 31.5 g/dL (ref 30.0–36.0)
MCV: 86.6 fL (ref 80.0–100.0)
Monocytes Absolute: 1.3 10*3/uL — ABNORMAL HIGH (ref 0.1–1.0)
Monocytes Relative: 15 %
Neutro Abs: 5.4 10*3/uL (ref 1.7–7.7)
Neutrophils Relative %: 62 %
Platelets: 308 10*3/uL (ref 150–400)
RBC: 3.37 MIL/uL — ABNORMAL LOW (ref 4.22–5.81)
RDW: 17.8 % — ABNORMAL HIGH (ref 11.5–15.5)
WBC: 8.7 10*3/uL (ref 4.0–10.5)
nRBC: 0 % (ref 0.0–0.2)

## 2023-05-28 LAB — GLUCOSE, CAPILLARY
Glucose-Capillary: 328 mg/dL — ABNORMAL HIGH (ref 70–99)
Glucose-Capillary: 281 mg/dL — ABNORMAL HIGH (ref 70–99)
Glucose-Capillary: 222 mg/dL — ABNORMAL HIGH (ref 70–99)
Glucose-Capillary: 123 mg/dL — ABNORMAL HIGH (ref 70–99)

## 2023-05-28 LAB — MAGNESIUM: Magnesium: 1.8 mg/dL (ref 1.7–2.4)

## 2023-05-28 MED ORDER — INSULIN GLARGINE-YFGN 100 UNIT/ML ~~LOC~~ SOLN
15.0000 [IU] | Freq: Two times a day (BID) | SUBCUTANEOUS | Status: DC
  Administered 2023-05-28 – 2023-05-29 (×3): 15 [IU] via SUBCUTANEOUS
  Filled 2023-05-28 (×4): qty 0.15

## 2023-05-28 MED ORDER — INSULIN ASPART 100 UNIT/ML IJ SOLN
5.0000 [IU] | Freq: Three times a day (TID) | INTRAMUSCULAR | Status: DC
  Administered 2023-05-28 – 2023-05-29 (×4): 5 [IU] via SUBCUTANEOUS

## 2023-05-28 NOTE — Progress Notes (Signed)
 Physical Therapy Treatment Patient Details Name: Dylan Owen MRN: [id number] DOB: [birth year] Today's Date: 05/28/2023   History of Present Illness Pt is 58 yo presenting to [facility] as transfer from [facility] 2/1 due to mixed septic and cardiogenic shock. Pt presented to [facility] hospital due to referral from MD on 05/05/23. Pt started on CRRT on 2/2-2/8, transitioned to HD. Intubated on 2/3 due to constant BiPAP use and unable to wean. Intubation was discontinued on 2/5. On BIPAP on 2/7 then off BiPAP. CRRT discontinued on 2/8.  Had intermittent HD at bedside this am.  PMH: HTN, hyperlipidemia, DM II, sleep apnea, chronic back pain.    PT Comments  Pt received in bed and needing to go to bathroom. Pt mobilizing with CGA and use of RW. While seated on toilet pt accidentally urinated on floor instead of in toilet but pt was able to clean floor on his own with towels and then with assist only for set up was able to wash self down, and change gown and socks. Pt exhausted after this but is increasing in independence. Continue to recommend HHPT at d/c.     If plan is discharge home, recommend the following: A little help with walking and/or transfers;A little help with bathing/dressing/bathroom   Can travel by private vehicle        Equipment Recommendations  BSC/3in1;Rollator (4 wheels) Regulatory affairs officer)    Recommendations for Other Services       Precautions / Restrictions Precautions Precautions: Fall Precaution/Restrictions Comments: watch SpO2/HR Restrictions Weight Bearing Restrictions Per Provider Order: No     Mobility  Bed Mobility Overal bed mobility: Needs Assistance Bed Mobility: Supine to Sit     Supine to sit: Used rails, Supervision     General bed mobility comments: Incr time to elevate trunk but no physical assist    Transfers Overall transfer level: Needs assistance Equipment used: Rolling walker (2 wheels) Transfers: Sit to/from Stand Sit to Stand: Contact guard assist            General transfer comment: CGA from elevated bed and toilet with use of grab bar    Ambulation/Gait Ambulation/Gait assistance: Contact guard assist Gait Distance (Feet): 20 Feet Assistive device: Rolling walker (2 wheels) Gait Pattern/deviations: Step-through pattern, Decreased stride length, Wide base of support Gait velocity: decreased Gait velocity interpretation: <1.31 ft/sec, indicative of household ambulator   General Gait Details: after bathroom and cleaup with changing of gown, socks, washing up, pt fatigued and needed to rest in chair   Stairs             Wheelchair Mobility     Tilt Bed    Modified Rankin (Stroke Patients Only)       Balance Overall balance assessment: Needs assistance Sitting-balance support: Feet supported, No upper extremity supported Sitting balance-Leahy Scale: Good Sitting balance - Comments: no LOB bending over to adjust shoe   Standing balance support: Bilateral upper extremity supported, Reliant on assistive device for balance Standing balance-Leahy Scale: Poor Standing balance comment: heavily dependent on UEs on walker, no overt LOB                            Communication Communication Communication: No apparent difficulties  Cognition Arousal: Alert Behavior During Therapy: WFL for tasks assessed/performed   PT - Cognitive impairments: Memory, Safety/Judgement  PT - Cognition Comments: told pt not to get up without calling and then right after walked out of room pt got up to grab something on bed setting off chair alarm Following commands: Intact      Cueing Cueing Techniques: Verbal cues, Tactile cues  Exercises      General Comments General comments (skin integrity, edema, etc.): pt unknowingly urinated on floor in bathroom when trying to go in toilet but was able to clean floor up himself with towels from sitting on toilet. Was also able to change socks and  wipe self down with wet wash cloth except back where therapist assisted. Pt also requested assist for peri care due to skin breakdown and pain      Pertinent Vitals/Pain Pain Assessment Pain Assessment: Faces Faces Pain Scale: Hurts a little bit Pain Location: bottom Pain Descriptors / Indicators: Sore Pain Intervention(s): Monitored during session, Other (comment) (cream applied)    Home Living                          Prior Function            PT Goals (current goals can now be found in the care plan section) Acute Rehab PT Goals Patient Stated Goal: to improve mobility PT Goal Formulation: With patient Time For Goal Achievement: 06/08/23 Potential to Achieve Goals: Good Progress towards PT goals: Progressing toward goals    Frequency    Min 1X/week      PT Plan      Co-evaluation              AM-PAC PT "6 Clicks" Mobility   Outcome Measure  Help needed turning from your back to your side while in a flat bed without using bedrails?: A Little Help needed moving from lying on your back to sitting on the side of a flat bed without using bedrails?: A Little Help needed moving to and from a bed to a chair (including a wheelchair)?: A Little Help needed standing up from a chair using your arms (e.g., wheelchair or bedside chair)?: A Little Help needed to walk in hospital room?: A Little Help needed climbing 3-5 steps with a railing? : A Lot 6 Click Score: 17    End of Session   Activity Tolerance: Patient tolerated treatment well Patient left: in chair;with call bell/phone within reach;with chair alarm set Nurse Communication: Mobility status PT Visit Diagnosis: Unsteadiness on feet (R26.81);Other abnormalities of gait and mobility (R26.89);Muscle weakness (generalized) (M62.81)     Time: 4098-1191 PT Time Calculation (min) (ACUTE ONLY): 16 min  Charges:    $Gait Training: 8-22 mins PT General Charges $$ ACUTE PT VISIT: 1 Visit                      Lyanne Co, PT  Acute Rehab Services Secure chat preferred Office [phone]    [name] 05/28/2023, 12:43 PM

## 2023-05-28 NOTE — Progress Notes (Signed)
 Patient ID: Dylan Owen, male   DOB: Jun 05, 1965, 58 y.o.   MRN: [id number] [facility] Progress Note   Assessment/ Plan:   1. Acute kidney Injury: With essentially normal renal function at baseline (creatinine 1.1) and suffered ATN in the setting of shock.  CRRT early on.  Now on state of recovery - s/p [facility] 2/13  - HD 2/17 last now recovering - Put in consult for IR to remove his TDC. Once that is removed he is okay to go from nephrology perspective and we will arrange labs and f/u -Continue to monitor for signs of recovery  2.  Mixed cardiogenic/septic shock: Clinically suspected to be associated with viral myocarditis/acute CHF exacerbation.  Urine output improving.  Continue to monitor for recovery.  Lasix if needed. He can stop midodrine 3.  Shock liver: Has improved with hemodynamic support and time 4.  Acute hypoxic/hypercarbic respiratory failure: Now improved with ultrafiltration 5. Anemia-hemoglobin 9.2.  Continue to monitor. Hopefully will improve as he recovers 6.  Uncontrolled type 2 diabetes with hyperglycemia.  Management per primary team   Subjective:   Continue to feel well with no complaints. UOP good. Crt improving   Objective:   BP 139/69 (BP Location: Left Arm)   Pulse 96   Temp 98 F (36.7 C) (Oral)   Resp 18   Ht 6\' 1"  (1.854 m)   Wt (!) 139.1 kg   SpO2 98%   BMI 40.46 kg/m   Intake/Output Summary (Last 24 hours) at 05/28/2023 1010 Last data filed at 05/28/2023 0830 Gross per 24 hour  Intake 1920 ml  Output 3450 ml  Net -1530 ml   Weight change:   Physical Exam: Gen: Lying in bed, no distress CVS: Tachycardia, no rub Resp: Bilateral chest rise with no increased work of breathing Abd: Soft, obese, nontender, bowel sounds normal Ext: Trace edema in the bilateral lower extremities, warm and well-perfused  Imaging: No results found.    Labs: BMET Recent Labs  Lab 05/22/23 0316 05/23/23 0316 05/24/23 0324 05/25/23 0335  05/26/23 0354 05/27/23 0503 05/28/23 0311  NA 131* 133* 133* 139 138 140 142  K 5.1 5.5* 5.0 4.8 4.5 4.8 4.6  CL 93* 94* 94* 101 101 101 97*  CO2 24 24 27 26 24 25 26   GLUCOSE 205* 324* 333* 148* 144* 299* 298*  BUN 98* 124* 77* 100* 107* 100* 96*  CREATININE 5.55* 6.03* 4.13* 4.51* 4.13* 3.82* 3.53*  CALCIUM 8.9 8.7* 8.8* 9.2 8.7* 9.0 10.0  PHOS 5.1* 5.8* 4.2 5.6* 6.2* 5.4* 5.3*   CBC Recent Labs  Lab 05/22/23 0316 05/23/23 0316 05/28/23 0311  WBC 11.7* 11.7* 8.7  NEUTROABS  --   --  5.4  HGB 10.3* 10.1* 9.2*  HCT 31.9* 31.8* 29.2*  MCV 82.0 82.8 86.6  PLT 299 315 308    Medications:     amiodarone  200 mg Oral Daily   apixaban  5 mg Oral BID   bacitracin   Topical BID   Chlorhexidine Gluconate Cloth  6 each Topical Daily   famotidine  20 mg Oral Daily   feeding supplement  237 mL Oral BID BM   insulin aspart  0-15 Units Subcutaneous TID WC   insulin aspart  5 Units Subcutaneous TID WC   insulin glargine-yfgn  15 Units Subcutaneous BID   lidocaine  1 patch Transdermal Q24H   methocarbamol  500 mg Oral BID   metoprolol succinate  12.5 mg Oral Daily   multivitamin  1 tablet Oral QHS   nicotine  14 mg Transdermal Daily   oxyCODONE  10 mg Oral Q6H    Darnell Level  05/28/2023, 10:10 AM

## 2023-05-28 NOTE — Progress Notes (Signed)
 PROGRESS NOTE    Dylan Owen  NFA:[id number] DOB: January 18, 1966 DOA: 05/04/2023 PCP: Jeoffrey Massed, MD   Brief Narrative:  59 year old male with obesity, HTN, HLP, diabetes mellitus type 2, OSA, chronic back pain, depression, GERD was admitted on 05/04/2023 with cough, sore throat, dizziness, myalgias for a week.  Patient had also reported chest tightness, headaches, chills, diaphoresis with dyspnea on exertion.  BNP was 1275.  Respiratory virus panel negative.  Patient was placed on Lasix.  Troponins mildly elevated, diagnosis of viral myocarditis was made. 2D echo showed EF ~25% although poor acoustic windows, cardiology was consulted. As of 05/06/2023, creatinine started trending up, was transferred to intensive care unit.  He was placed on BiPAP, milrinone.  Patient was intubated on 2/3 and was extubated on 2/5.  Also started on CRRT, currently on intermittent hemodialysis. Patient was transferred out to the floor and TRH assumed care on 05/21/2023  Significant Hospital Events:    1/29 Admit to [facility], diuresis 1/30 ECHO w LVEF 25% 1/31 Cards consult, c/f viral myocarditis  2/1 PCCM consult txf to [facility], Adv HF consult  2/2 For HD cath placement and CRRT. Incr D10 for persistent hypogly   2/3 Milrinone stopped. Weaning NE; intubated due to being on bipap for a few days and needing to go to CT  2/5 Extubated. Cleviprex for hypertension   2/6 CT Head for anisocoria, no acute findings. Off clevi. Adding PRN metop. CRRT UF decr to 150/hr.  2/7 On BiPAP. RHC and DCCV  2/8 Off crrt  2/9 Albumin + lasix challenge. HIT neg   2/10: got iHD 2/12: HD cath pulled by pt overnight. Ordered IR for TDC. Holding bivalirudin  2/13: did not get TDC yesterday because WBC 14.  TDC on 2/13.   2/14: TDC on 2/13, underwent iHD, out of ICU after dialysis.   Assessment & Plan:   Sepsis secondary to ? UTI, multifocal MSSA pneumonia: Present on admission septic shock-resolved Acute urinary  retention -Presented febrile, TEE showed moderately reduced LVEF.  Initial concern for cardiogenic shock with HFrEF, possible viral myocarditis, sequelae of multisystem organ dysfunction.  Per PCCM, likely septic shock, was placed on broad-spectrum antibiotics with meropenem and improved significantly. -Patient had required vasopressors in ICU, currently off -Respiratory virus panel negative, blood cultures 1/31 negative, urine culture showed multiple species.  -Tracheal aspirate showed rare Candida and Staph aureus/MSSA -Patient has completed 7 days of IV meropenem on 05/14/2023 -Sepsis physiology resolved -Foley catheter removed, voiding successfully   Acute hypoxic and hypercarbic respiratory failure MSSA pneumonia History of obstructive sleep apnea -Secondary to PNA, acute systolic heart failure, was intubated on 2/3, extubated on 2/5.  Pulmonology recommended O2, BiPAP PRN + at bedtime -Hypoxia resolved.  Currently on room air. -Outpatient sleep study recommended, has been refusing BiPAP intermittently at night    Acute systolic HFrEF-- septic cardiomyopathy  Pulmonary HTN (group II and III) -Initial echo 1/30: Unable to estimate EF due to poor quality - Repeat Echo 2/6 showed EF 40-45%.  - RHC with normal R and L filling pressures, elevated PA mean and PVR. -Volume management with intermittent HD.  Unable to add GDMT due to hypotension. -BP improving, continue midodrine.  Continue metoprolol succinate -Cardiology has signed off.  Outpatient follow-up with Dr. Rennis Golden scheduled   New Afib/flutter s/p DCCV 2/7 -- now sinus rhythm -No further epistaxis, continue eliquis -Maintaining NSR, cardiology recommended reducing amiodarone to 200 mg daily, -Continue metoprolol succinate.  Currently rate controlled   epistaxis -Improved,  not on O2 now -Continue Eliquis as recommended by cardiology    Acute kidney injury likely ATN AGMA, resolved  -Nephrology following, at baseline creatinine  1.1, worsened due to ATN in the setting of shock. -Placed on CRRT on 2/6.  Status post [facility] on 2/13. -Nephrology following, last HD on 2/17, hold further dialysis for now, creatinine appears to have plateaued and may not need HD.  Creatinine 3.53 today.  Patient is making urine.   Diabetes mellitus type 2, uncontrolled with hyperglycemia and hypoglycemia -Hemoglobin A1c 10.2 on 05/05/2023 -Has had intermittent hypoglycemia.  Decrease long-acting insulin and short acting insulin with meals.  Continue CBGs with SSI.    Obesity class II -Outpatient follow-up  Leukocytosis -Resolved   hyponatremia -Resolved  Anemia of chronic disease -Hemoglobin currently stable.  Monitor intermittently.  Hyperkalemia -Resolved  Physical deconditioning -PT initially recommended inpatient rehab but as per CIR, patient will not need CIR.  PT now recommending home health PT.  TOC following.   DVT prophylaxis: Eliquis Code Status: Full Family Communication: None at bedside Disposition Plan: Status is: Inpatient Remains inpatient appropriate because: Of severity of illness   Consultants: PCCM/cardiology/nephrology/IR  Procedures: As above  Antimicrobials:  Anti-infectives (From admission, onward)    Start     Dose/Rate Route Frequency Ordered Stop   05/19/23 1505  ceFAZolin (ANCEF) IVPB 2g/100 mL premix        over 30 Minutes Intravenous Continuous PRN 05/19/23 1519 05/19/23 1505   05/12/23 2200  meropenem (MERREM) 1 g in sodium chloride 0.9 % 100 mL IVPB        1 g 200 mL/hr over 30 Minutes Intravenous Every 12 hours 05/12/23 0755 05/14/23 2148   05/10/23 0000  vancomycin (VANCOREADY) IVPB 1500 mg/300 mL  Status:  Discontinued        1,500 mg 150 mL/hr over 120 Minutes Intravenous Every 24 hours 05/09/23 0945 05/10/23 0808   05/09/23 1045  vancomycin (VANCOREADY) IVPB 2000 mg/400 mL        2,000 mg 200 mL/hr over 120 Minutes Intravenous  Once 05/09/23 0945 05/09/23 1222   05/08/23 1600   meropenem (MERREM) 1 g in sodium chloride 0.9 % 100 mL IVPB  Status:  Discontinued        1 g 200 mL/hr over 30 Minutes Intravenous Every 8 hours 05/08/23 1505 05/12/23 0755   05/08/23 1500  piperacillin-tazobactam (ZOSYN) IVPB 3.375 g  Status:  Discontinued        3.375 g 100 mL/hr over 30 Minutes Intravenous Every 6 hours 05/08/23 0833 05/08/23 1444   05/07/23 1400  piperacillin-tazobactam (ZOSYN) IVPB 3.375 g  Status:  Discontinued        3.375 g 12.5 mL/hr over 240 Minutes Intravenous Every 8 hours 05/07/23 0836 05/08/23 0832   05/07/23 1200  ceFEPIme (MAXIPIME) 2 g in sodium chloride 0.9 % 100 mL IVPB  Status:  Discontinued        2 g 200 mL/hr over 30 Minutes Intravenous Every 12 hours 05/06/23 2346 05/07/23 0827   05/07/23 0930  piperacillin-tazobactam (ZOSYN) IVPB 3.375 g        3.375 g 100 mL/hr over 30 Minutes Intravenous  Once 05/07/23 0836 05/07/23 1200   05/07/23 0030  ceFEPIme (MAXIPIME) 2 g in sodium chloride 0.9 % 100 mL IVPB        2 g 200 mL/hr over 30 Minutes Intravenous  Once 05/06/23 2339 05/07/23 0100   05/07/23 0030  metroNIDAZOLE (FLAGYL) IVPB 500 mg  Status:  Discontinued        500 mg 100 mL/hr over 60 Minutes Intravenous Every 12 hours 05/06/23 2339 05/07/23 0827        Subjective: Patient seen and examined at bedside.  No fever, worsening shortness of breath, vomiting reported.  Continues to make urine. Objective: Vitals:   05/27/23 1620 05/27/23 1933 05/28/23 0454 05/28/23 0500  BP: (!) 131/47 135/61 122/63   Pulse: 96 (!) 103 92   Resp: 16 20 18    Temp: (!) 97.4 F (36.3 C) 98.1 F (36.7 C) (!) 97.5 F (36.4 C)   TempSrc: Oral Oral Oral   SpO2: 96% 97% 97%   Weight:    (!) 139.1 kg  Height:        Intake/Output Summary (Last 24 hours) at 05/28/2023 0743 Last data filed at 05/28/2023 0700 Gross per 24 hour  Intake 1920 ml  Output 3650 ml  Net -1730 ml   Filed Weights   05/21/23 0555 05/23/23 0500 05/28/23 0500  Weight: 132.9 kg (!) 136.4  kg (!) 139.1 kg    Examination:  General: Currently on room air.  No distress.  Chronically ill and deconditioned looking. ENT/neck: No thyromegaly.  JVD is not elevated  respiratory: Decreased breath sounds at bases bilaterally with some crackles; no wheezing  CVS: S1-S2 heard, rate controlled currently Abdominal: Soft, morbidly obese, nontender, slightly distended; no organomegaly, bowel sounds are heard Extremities: Trace lower extremity edema; no cyanosis  CNS: Awake and alert.  No focal neurologic deficit.  Moves extremities Lymph: No obvious lymphadenopathy Skin: No obvious ecchymosis/lesions  psych: Mostly flat affect.  Currently not agitated. musculoskeletal: No obvious joint swelling/deformity     Data Reviewed: I have personally reviewed following labs and imaging studies  CBC: Recent Labs  Lab 05/22/23 0316 05/23/23 0316 05/28/23 0311  WBC 11.7* 11.7* 8.7  NEUTROABS  --   --  5.4  HGB 10.3* 10.1* 9.2*  HCT 31.9* 31.8* 29.2*  MCV 82.0 82.8 86.6  PLT 299 315 308   Basic Metabolic Panel: Recent Labs  Lab 05/24/23 0324 05/25/23 0335 05/26/23 0354 05/27/23 0503 05/28/23 0311  NA 133* 139 138 140 142  K 5.0 4.8 4.5 4.8 4.6  CL 94* 101 101 101 97*  CO2 27 26 24 25 26   GLUCOSE 333* 148* 144* 299* 298*  BUN 77* 100* 107* 100* 96*  CREATININE 4.13* 4.51* 4.13* 3.82* 3.53*  CALCIUM 8.8* 9.2 8.7* 9.0 10.0  MG 1.9 2.0 1.9 2.0 1.8  PHOS 4.2 5.6* 6.2* 5.4* 5.3*   GFR: Estimated Creatinine Clearance: 33.8 mL/min (A) (by C-G formula based on SCr of 3.53 mg/dL (H)). Liver Function Tests: Recent Labs  Lab 05/24/23 0324 05/25/23 0335 05/26/23 0354 05/27/23 0503 05/28/23 0311  ALBUMIN 2.3* 2.4* 2.3* 2.3* 2.4*   No results for input(s): "LIPASE", "AMYLASE" in the last 168 hours. No results for input(s): "AMMONIA" in the last 168 hours. Coagulation Profile: No results for input(s): "INR", "PROTIME" in the last 168 hours. Cardiac Enzymes: No results for  input(s): "CKTOTAL", "CKMB", "CKMBINDEX", "TROPONINI" in the last 168 hours. BNP (last 3 results) No results for input(s): "PROBNP" in the last 8760 hours. HbA1C: No results for input(s): "HGBA1C" in the last 72 hours. CBG: Recent Labs  Lab 05/27/23 1653 05/27/23 2127 05/27/23 2323 05/27/23 2341 05/28/23 0725  GLUCAP 198* 72 48* 104* 328*   Lipid Profile: No results for input(s): "CHOL", "HDL", "LDLCALC", "TRIG", "CHOLHDL", "LDLDIRECT" in the last 72 hours. Thyroid Function Tests: No results  for input(s): "TSH", "T4TOTAL", "FREET4", "T3FREE", "THYROIDAB" in the last 72 hours. Anemia Panel: No results for input(s): "VITAMINB12", "FOLATE", "FERRITIN", "TIBC", "IRON", "RETICCTPCT" in the last 72 hours. Sepsis Labs: No results for input(s): "PROCALCITON", "LATICACIDVEN" in the last 168 hours.  No results found for this or any previous visit (from the past 240 hours).       Radiology Studies: No results found.      Scheduled Meds:  amiodarone  200 mg Oral Daily   apixaban  5 mg Oral BID   bacitracin   Topical BID   Chlorhexidine Gluconate Cloth  6 each Topical Daily   famotidine  20 mg Oral Daily   feeding supplement  237 mL Oral BID BM   insulin aspart  0-15 Units Subcutaneous TID WC   insulin aspart  7 Units Subcutaneous TID WC   insulin glargine-yfgn  20 Units Subcutaneous BID   lidocaine  1 patch Transdermal Q24H   methocarbamol  500 mg Oral BID   metoprolol succinate  12.5 mg Oral Daily   multivitamin  1 tablet Oral QHS   nicotine  14 mg Transdermal Daily   oxyCODONE  10 mg Oral Q6H   Continuous Infusions:        Glade Lloyd, MD [facility] 05/28/2023, 7:43 AM

## 2023-05-29 DIAGNOSIS — I5021 Acute systolic (congestive) heart failure: Secondary | ICD-10-CM | POA: Diagnosis not present

## 2023-05-29 LAB — RENAL FUNCTION PANEL
Albumin: 2.6 g/dL — ABNORMAL LOW (ref 3.5–5.0)
Anion gap: 11 (ref 5–15)
BUN: 84 mg/dL — ABNORMAL HIGH (ref 6–20)
CO2: 24 mmol/L (ref 22–32)
Calcium: 8.8 mg/dL — ABNORMAL LOW (ref 8.9–10.3)
Chloride: 105 mmol/L (ref 98–111)
Creatinine, Ser: 3.09 mg/dL — ABNORMAL HIGH (ref 0.61–1.24)
GFR, Estimated: 23 mL/min — ABNORMAL LOW (ref >60)
Glucose, Bld: 163 mg/dL — ABNORMAL HIGH (ref 70–99)
Phosphorus: 4.2 mg/dL (ref 2.5–4.6)
Potassium: 4.4 mmol/L (ref 3.5–5.1)
Sodium: 140 mmol/L (ref 135–145)

## 2023-05-29 LAB — MAGNESIUM: Magnesium: 1.7 mg/dL (ref 1.7–2.4)

## 2023-05-29 LAB — GLUCOSE, CAPILLARY: Glucose-Capillary: 183 mg/dL — ABNORMAL HIGH (ref 70–99)

## 2023-05-29 MED ORDER — TRESIBA FLEXTOUCH 100 UNIT/ML ~~LOC~~ SOPN: 20.0000 [IU] | PEN_INJECTOR | Freq: Two times a day (BID) | SUBCUTANEOUS | Status: DC

## 2023-05-29 MED ORDER — APIXABAN 5 MG PO TABS: 5.0000 mg | ORAL_TABLET | Freq: Two times a day (BID) | ORAL | 0 refills | Status: DC

## 2023-05-29 MED ORDER — AMIODARONE HCL 200 MG PO TABS: 200.0000 mg | ORAL_TABLET | Freq: Every day | ORAL | 0 refills | Status: DC

## 2023-05-29 NOTE — Progress Notes (Signed)
 Physical Therapy Treatment Patient Details Name: Dylan Owen MRN: [id number] DOB: 02-25-1966 Today's Date: 05/29/2023   History of Present Illness Pt is 58 yo presenting to [facility] as transfer from [facility] 2/1 due to mixed septic and cardiogenic shock. Pt presented to [facility] hospital due to referral from MD on 05/05/23. Pt started on CRRT on 2/2-2/8, transitioned to HD. Intubated on 2/3 due to constant BiPAP use and unable to wean. Intubation was discontinued on 2/5. On BIPAP on 2/7 then off BiPAP. CRRT discontinued on 2/8.  Had intermittent HD at bedside this am.  PMH: HTN, hyperlipidemia, DM II, sleep apnea, chronic back pain.    PT Comments  Pt has made good progress with mobility, getting in and out of bed independently, donning shoes independently. Trialed bariatric rollator today and pt did very well with this, recommend for home. Pt ambulated 300' with 3 seated rest breaks, 1 min ea. HR 120 bpm with ambulation, SPO2 mid 90's on RA. Recommend HHPT at d/c, pt agreeable. PT will continue to follow.     If plan is discharge home, recommend the following: A little help with walking and/or transfers;A little help with bathing/dressing/bathroom   Can travel by private vehicle        Equipment Recommendations  BSC/3in1;Rollator (4 wheels) Regulatory affairs officer)    Recommendations for Other Services       Precautions / Restrictions Precautions Precautions: Fall Precaution/Restrictions Comments: watch SpO2/HR Restrictions Weight Bearing Restrictions Per Provider Order: No     Mobility  Bed Mobility Overal bed mobility: Modified Independent Bed Mobility: Supine to Sit     Supine to sit: Used rails, Modified independent (Device/Increase time)     General bed mobility comments: mod I in and out of bed, increased time needed    Transfers Overall transfer level: Needs assistance Equipment used: Rollator (4 wheels) Transfers: Sit to/from Stand Sit to Stand: Supervision           General  transfer comment: brought bariatric rollator for pt to trial. Pt shows good knowledge of use and brakes. Stood safely to rollator and then when sitting and standing from it.    Ambulation/Gait Ambulation/Gait assistance: Supervision Gait Distance (Feet): 300 Feet (3 seated rest breaks, 1 min ea) Assistive device: Rollator (4 wheels) Gait Pattern/deviations: Step-through pattern, Decreased stride length, Wide base of support, Trunk flexed Gait velocity: decreased Gait velocity interpretation: 1.31 - 2.62 ft/sec, indicative of limited community ambulator   General Gait Details: vc's for posture   Stairs             Wheelchair Mobility     Tilt Bed    Modified Rankin (Stroke Patients Only)       Balance Overall balance assessment: Needs assistance Sitting-balance support: Feet supported, No upper extremity supported Sitting balance-Leahy Scale: Good Sitting balance - Comments: pt able to don shoes EOB independently   Standing balance support: Bilateral upper extremity supported, Reliant on assistive device for balance Standing balance-Leahy Scale: Poor Standing balance comment: heavily dependent on UEs on walker, no overt LOB                            Communication Communication Communication: No apparent difficulties  Cognition Arousal: Alert Behavior During Therapy: WFL for tasks assessed/performed   PT - Cognitive impairments: Memory, Safety/Judgement                       PT - Cognition  Comments: good recall of precautions from session yesterday Following commands: Intact      Cueing Cueing Techniques: Verbal cues, Tactile cues  Exercises      General Comments General comments (skin integrity, edema, etc.): discussed home activity level and rec for HHPT. HR up to 120 bpm with ambulation, SPO2 remained mid 90's on RA      Pertinent Vitals/Pain Pain Assessment Pain Assessment: Faces Faces Pain Scale: No hurt    Home Living                           Prior Function            PT Goals (current goals can now be found in the care plan section) Acute Rehab PT Goals Patient Stated Goal: to improve mobility PT Goal Formulation: With patient Time For Goal Achievement: 06/08/23 Potential to Achieve Goals: Good Progress towards PT goals: Progressing toward goals    Frequency    Min 1X/week      PT Plan      Owen-evaluation              AM-PAC PT "6 Clicks" Mobility   Outcome Measure  Help needed turning from your back to your side while in a flat bed without using bedrails?: None Help needed moving from lying on your back to sitting on the side of a flat bed without using bedrails?: None Help needed moving to and from a bed to a chair (including a wheelchair)?: A Little Help needed standing up from a chair using your arms (e.g., wheelchair or bedside chair)?: A Little Help needed to walk in hospital room?: A Little Help needed climbing 3-5 steps with a railing? : A Lot 6 Click Score: 19    End of Session Equipment Utilized During Treatment: Gait belt Activity Tolerance: Patient tolerated treatment well Patient left: in chair;with call bell/phone within reach;with chair alarm set Nurse Communication: Mobility status PT Visit Diagnosis: Unsteadiness on feet (R26.81);Other abnormalities of gait and mobility (R26.89);Muscle weakness (generalized) (M62.81)     Time: 1610-9604 PT Time Calculation (min) (ACUTE ONLY): 39 min  Charges:    $Gait Training: 23-37 mins $Therapeutic Activity: 8-22 mins PT General Charges $$ ACUTE PT VISIT: 1 Visit                     Dylan Owen, PT  Acute Rehab Services Secure chat preferred Office [phone]    Dylan Owen Dylan Owen 05/29/2023, 9:34 AM

## 2023-05-29 NOTE — TOC Transition Note (Addendum)
 Transition of Care [facility]) - Discharge Note   Patient Details  Name: Dylan Owen MRN: [id number] Date of Birth: December 08, 1965  Transition of Care [facility]) CM/SW Contact:  Ronny Bacon, RN Phone Number: 05/29/2023, 9:34 AM   Clinical Narrative:   Patient is being discharged today. Cory with [facility] made aware.  1128 Secure message from floor nurse regarding need for rollator. Per previous CM, noted that patient did not meet weight requirement for bariatric rollator. Reached out to [place] with [employer] to confirm that patient meets weight class for bariatric rollator. Per Vaughan Basta, patient qualifies, bariatric rollator to be delivered to bedside prior to discharge. Floor nurse made aware.     Final next level of care: Home w Home Health Services Barriers to Discharge: No Barriers Identified   Patient Goals and CMS Choice Patient states their goals for this hospitalization and ongoing recovery are:: wants husband to recover CMS Medicare.gov Compare Post Acute Care list provided to:: Patient Choice offered to / list presented to : Patient      Discharge Placement                       Discharge Plan and Services Additional resources added to the After Visit Summary for     Discharge Planning Services: CM Consult Post Acute Care Choice: Home Health                    HH Arranged: RN, PT [facility] Agency: [facility] Date [facility] Agency Contacted: 05/16/23 Time HH Agency Contacted: 1555 Representative spoke with at [facility] Agency: Lorenza Chick  Social Drivers of Health (SDOH) Interventions SDOH Screenings   Food Insecurity: No Food Insecurity (05/05/2023)  Recent Concern: Food Insecurity - Food Insecurity Present (05/05/2023)  Housing: Low Risk  (05/05/2023)  Transportation Needs: No Transportation Needs (05/05/2023)  Utilities: Not At Risk (05/05/2023)  Alcohol Screen: Low Risk  (11/05/2020)  Depression (PHQ2-9): High Risk (11/26/2022)  Financial Resource Strain: High  Risk (12/05/2022)  Physical Activity: Unknown (12/05/2022)  Social Connections: Moderately Isolated (05/05/2023)  Stress: Stress Concern Present (12/05/2022)  Tobacco Use: High Risk (05/05/2023)     Readmission Risk Interventions     No data to display

## 2023-05-29 NOTE — Plan of Care (Signed)
 Problem: Skin Integrity: Goal: Risk for impaired skin integrity will decrease Outcome: Progressing   Problem: Health Behavior/Discharge Planning: Goal: Ability to manage health-related needs will improve Outcome: Progressing   Problem: Clinical Measurements: Goal: Ability to maintain clinical measurements within normal limits will improve Outcome: Progressing Goal: Will remain free from infection Outcome: Progressing Goal: Respiratory complications will improve Outcome: Progressing Goal: Cardiovascular complication will be avoided Outcome: Progressing   Problem: Activity: Goal: Risk for activity intolerance will decrease Outcome: Progressing   Problem: Nutrition: Goal: Adequate nutrition will be maintained Outcome: Progressing   Problem: Coping: Goal: Level of anxiety will decrease Outcome: Progressing   Problem: Elimination: Goal: Will not experience complications related to bowel motility Outcome: Progressing Goal: Will not experience complications related to urinary retention Outcome: Progressing   Problem: Pain Managment: Goal: General experience of comfort will improve and/or be controlled Outcome: Progressing   Problem: Safety: Goal: Ability to remain free from injury will improve Outcome: Progressing   Problem: Skin Integrity: Goal: Risk for impaired skin integrity will decrease Outcome: Progressing   Problem: Education: Goal: Knowledge of General Education information will improve Description: Including pain rating scale, medication(s)/side effects and non-pharmacologic comfort measures Outcome: Progressing   Problem: Health Behavior/Discharge Planning: Goal: Ability to manage health-related needs will improve Outcome: Progressing   Problem: Clinical Measurements: Goal: Ability to maintain clinical measurements within normal limits will improve Outcome: Progressing Goal: Will remain free from infection Outcome: Progressing Goal: Diagnostic test  results will improve Outcome: Progressing Goal: Respiratory complications will improve Outcome: Progressing Goal: Cardiovascular complication will be avoided Outcome: Progressing   Problem: Activity: Goal: Risk for activity intolerance will decrease Outcome: Progressing   Problem: Nutrition: Goal: Adequate nutrition will be maintained Outcome: Progressing   Problem: Coping: Goal: Level of anxiety will decrease Outcome: Progressing   Problem: Elimination: Goal: Will not experience complications related to bowel motility Outcome: Progressing Goal: Will not experience complications related to urinary retention Outcome: Progressing   Problem: Pain Managment: Goal: General experience of comfort will improve and/or be controlled Outcome: Progressing   Problem: Safety: Goal: Ability to remain free from injury will improve Outcome: Progressing   Problem: Skin Integrity: Goal: Risk for impaired skin integrity will decrease Outcome: Progressing   Problem: Education: Goal: Knowledge of disease and its progression will improve Outcome: Progressing   Problem: Health Behavior/Discharge Planning: Goal: Ability to manage health-related needs will improve Outcome: Progressing   Problem: Clinical Measurements: Goal: Complications related to the disease process or treatment will be avoided or minimized Outcome: Progressing Goal: Dialysis access will remain free of complications Outcome: Progressing   Problem: Activity: Goal: Activity intolerance will improve Outcome: Progressing   Problem: Fluid Volume: Goal: Fluid volume balance will be maintained or improved Outcome: Progressing   Problem: Nutritional: Goal: Ability to make appropriate dietary choices will improve Outcome: Progressing   Problem: Respiratory: Goal: Respiratory symptoms related to disease process will be avoided Outcome: Progressing   Problem: Self-Concept: Goal: Body image disturbance will be avoided  or minimized Outcome: Progressing   Problem: Urinary Elimination: Goal: Progression of disease will be identified and treated Outcome: Progressing   Problem: Education: Goal: Understanding of CV disease, CV risk reduction, and recovery process will improve Outcome: Progressing Goal: Individualized Educational Video(s) Outcome: Progressing   Problem: Activity: Goal: Ability to return to baseline activity level will improve Outcome: Progressing   Problem: Cardiovascular: Goal: Ability to achieve and maintain adequate cardiovascular perfusion will improve Outcome: Progressing Goal: Vascular access site(s) Level 0-1 will be maintained  Outcome: Progressing

## 2023-05-29 NOTE — Discharge Summary (Signed)
 Physician Discharge Summary  [name]:[id number] DOB: 11/22/1965 DOA: 05/04/2023  PCP: Jeoffrey Massed, MD  Admit date: 05/04/2023 Discharge date: 05/29/2023  Admitted From: Home Disposition: Home  Recommendations for Outpatient Follow-up:  Follow up with PCP in 1 week with repeat CBC/BMP Outpatient follow-up with nephrology and cardiology IR to arrange for outpatient follow-up for removal of hemodialysis catheter. Follow up in ED if symptoms worsen or new appear   Home Health: Home health PT Equipment/Devices: None  Discharge Condition: Stable CODE STATUS: Full Diet recommendation: Heart healthy/carb modified  Brief/Interim Summary: 58 year old male with obesity, HTN, HLP, diabetes mellitus type 2, OSA, chronic back pain, depression, GERD was admitted on 05/04/2023 with cough, sore throat, dizziness, myalgias for a week.  Patient had also reported chest tightness, headaches, chills, diaphoresis with dyspnea on exertion.  BNP was 1275.  Respiratory virus panel negative.  Patient was placed on Lasix.  Troponins mildly elevated, diagnosis of viral myocarditis was made. 2D echo showed EF ~25% although poor acoustic windows, cardiology was consulted. As of 05/06/2023, creatinine started trending up, was transferred to intensive care unit.  He was placed on BiPAP, milrinone.  Patient was intubated on 2/3 and was extubated on 2/5.  Also started on CRRT, currently on intermittent hemodialysis. Patient was transferred out to the floor and TR[facility] assumed care on 05/21/2023   Significant Hospital Events:    1/29 Admit to [facility], diuresis 1/30 ECHO w LVEF 25% 1/31 Cards consult, c/f viral myocarditis  2/1 PCCM consult txf to [facility], Adv HF consult  2/2 For HD cath placement and CRRT. Incr D10 for persistent hypogly   2/3 Milrinone stopped. Weaning NE; intubated due to being on bipap for a few days and needing to go to CT  2/5 Extubated. Cleviprex for hypertension   2/6 CT Head for  anisocoria, no acute findings. Off clevi. Adding PRN metop. CRRT UF decr to 150/hr.  2/7 On BiPAP. RHC and DCCV  2/8 Off crrt  2/9 Albumin + lasix challenge. HIT neg   2/10: got iHD 2/12: HD cath pulled by pt overnight. Ordered IR for TDC. Holding bivalirudin  2/13: did not get TDC yesterday because WBC 14.  TDC on 2/13.   2/14: TDC on 2/13, underwent iHD, out of ICU after dialysis.   Subsequently, renal function has improved and patient has not required hemodialysis in several days and is making good urine.  Nephrology has signed off and cleared the patient for discharge.  Discharge patient home today with outpatient follow-up with PCP/nephrology/cardiology and IR.  Discharge Diagnoses:   Sepsis secondary to ? UTI, multifocal MSSA pneumonia: Present on admission septic shock-resolved Acute urinary retention -Presented febrile, TEE showed moderately reduced LVEF.  Initial concern for cardiogenic shock with HFrEF, possible viral myocarditis, sequelae of multisystem organ dysfunction.  Per PCCM, likely septic shock, was placed on broad-spectrum antibiotics with meropenem and improved significantly. -Patient had required vasopressors in ICU, currently off -Respiratory virus panel negative, blood cultures 1/31 negative, urine culture showed multiple species.  -Tracheal aspirate showed rare Candida and Staph aureus/MSSA -Patient has completed 7 days of IV meropenem on 05/14/2023 -Sepsis physiology resolved -Foley catheter removed, voiding successfully   Acute hypoxic and hypercarbic respiratory failure MSSA pneumonia History of obstructive sleep apnea -Secondary to PNA, acute systolic heart failure, was intubated on 2/3, extubated on 2/5.  Pulmonology recommended O2, BiPAP PRN + at bedtime -Hypoxia resolved.  Currently on room air. -Outpatient sleep study recommended, has been refusing BiPAP intermittently at night.  Outpatient follow-up with pulmonary    Acute systolic HFrEF-- septic  cardiomyopathy  Pulmonary HTN (group II and III) Hypertension -Initial echo 1/30: Unable to estimate EF due to poor quality - Repeat Echo 2/6 showed EF 40-45%.  - RHC with normal R and L filling pressures, elevated PA mean and PVR. -BP much improved.  Off midodrine.  Continue metoprolol  -Cardiology has signed off.  Outpatient follow-up with Dr. Rennis Golden scheduled   New Afib/flutter s/p DCCV 2/7 -- now sinus rhythm -No further epistaxis, continue eliquis -Maintaining NSR, cardiology recommended reducing amiodarone to 200 mg daily, -Continue metoprolol.  Currently rate controlled.  Outpatient follow-up with cardiology.   epistaxis -Improved, not on O2 now -Continue Eliquis as recommended by cardiology    Acute kidney injury likely ATN AGMA, resolved  -Nephrology following, at baseline creatinine 1.1, worsened due to ATN in the setting of shock. -Placed on CRRT on 2/6.  Status post [facility] on 2/13. -Nephrology following, last HD on 2/17, hold further dialysis for now, creatinine appears to have plateaued and may not need HD.  Creatinine 3.53 on 05/28/2023.  Patient is making urine. -Nephrology has cleared the patient for discharge and will arrange for outpatient follow-up.  IR will arrange for outpatient follow-up for removal of hemodialysis catheter. -Discharge patient home today.  Outpatient follow-up of BMP within a week.   Diabetes mellitus type 2, uncontrolled with hyperglycemia and hypoglycemia -Hemoglobin A1c 10.2 on 05/05/2023 -Has had intermittent hypoglycemia.  Decreased dose of long-acting insulin on discharge.  Carb modified diet.  Outpatient follow-up with PCP   obesity class II -Outpatient follow-up   Leukocytosis -Resolved    hyponatremia -Resolved   Anemia of chronic disease -Hemoglobin currently stable.  Monitor intermittently.   Hyperkalemia -Resolved   Physical deconditioning -PT initially recommended inpatient rehab but as per CIR, patient will not need CIR.  PT  now recommending home health PT.      Discharge Instructions  Discharge Instructions     Ambulatory referral to Cardiology   Complete by: As directed    Diet - low sodium heart healthy   Complete by: As directed    Diet Carb Modified   Complete by: As directed    Increase activity slowly   Complete by: As directed    No wound care   Complete by: As directed       Allergies as of 05/29/2023       Reactions   Ace Inhibitors    Kidney failure   Atorvastatin Other (See Comments)   Arm pain   Prednisone    REACTION: Anxiety   Septra [sulfamethoxazole-trimethoprim]    Metformin And Related Diarrhea        Medication List     STOP taking these medications    furosemide 20 MG tablet Commonly known as: LASIX   hydrochlorothiazide 25 MG tablet Commonly known as: HYDRODIURIL   lisinopril 40 MG tablet Commonly known as: ZESTRIL   venlafaxine XR 150 MG 24 hr capsule Commonly known as: EFFEXOR-XR       TAKE these medications    albuterol 108 (90 Base) MCG/ACT inhaler Commonly known as: VENTOLIN HFA INHALE 1-2 PUFFS BY MOUTH EVERY 6 HOURS AS NEEDED FOR WHEEZE OR SHORTNESS OF BREATH What changed: See the new instructions.   amiodarone 200 MG tablet Commonly known as: PACERONE Take 1 tablet (200 mg total) by mouth daily. Start taking on: May 30, 2023   apixaban 5 MG Tabs tablet Commonly known as: ELIQUIS Take 1  tablet (5 mg total) by mouth 2 (two) times daily.   B-D ULTRAFINE III SHORT PEN 31G X 8 MM Misc Generic drug: Insulin Pen Needle USE AS DIRECTED TWICE DAILY FOR MEDICATION INJECTION   blood glucose meter kit and supplies Kit Dispense based on patient and insurance preference. Use to check glucose 4 times daily.   esomeprazole 40 MG capsule Commonly known as: NEXIUM Take 1 capsule (40 mg total) by mouth daily.   FreeStyle Libre 3 Reader [employer] Use to check glucose continuously   [employer] 3 Sensor Misc Place 1 sensor on the skin every  14 days. Use to check glucose continuously   FREESTYLE TEST STRIPS test strip Generic drug: glucose blood USE TO CHECK GLUCOSE 4 TIMES DAILY.   methocarbamol 500 MG tablet Commonly known as: ROBAXIN Take 500 mg by mouth 2 (two) times daily as needed for muscle spasms.   metoprolol tartrate 50 MG tablet Commonly known as: LOPRESSOR TAKE 1 TABLET BY MOUTH TWICE A DAY. What changed: additional instructions   morphine 30 MG 12 hr tablet Commonly known as: MS CONTIN Take 30-60 mg by mouth See admin instructions. Taking 60 mg in the AM and 30 mg in the afternoon and 60 mg at bedtime   multivitamin tablet Take 1 tablet by mouth daily.   nitroGLYCERIN 0.4 MG SL tablet Commonly known as: NITROSTAT PLACE 1 TABLET UNDER THE TONGUE EVERY 5 MINUTES AS NEEDED.   NovoLOG FlexPen 100 UNIT/ML FlexPen Generic drug: insulin aspart USE 5 UNITS SUBCUTANEOUSLY AT SUPPER What changed: additional instructions   Repatha SureClick 140 MG/ML Soaj Generic drug: Evolocumab INJECT 1 DOSE INTO THE SKIN EVERY 14 (FOURTEEN) DAYS.   Evaristo Bury FlexTouch 100 UNIT/ML FlexTouch Pen Generic drug: insulin degludec Inject 20 Units into the skin 2 (two) times daily. What changed:  how much to take how to take this when to take this additional instructions   TYLENOL PO Take 500 mg by mouth daily as needed (pain).               Durable Medical Equipment  (From admission, onward)           Start     Ordered   05/20/23 1646  For home use only DME 3 n 1  Once        05/20/23 1646   05/19/23 1446  For home use only DME 4 wheeled rolling walker with seat  Once       Comments: Bariatric 133.4 kg  Question Answer Comment  Patient needs a walker to treat with the following condition Heart failure [facility])   Patient needs a walker to treat with the following condition Physical deconditioning      05/19/23 1448            Follow-up Information     [facility] Follow up.    Specialty: Home Health Services Why: Home health agency will call to arrange visits Contact information: [address] [place] [address] [phone]         Jeoffrey Massed, MD. Schedule an appointment as soon as possible for a visit in 1 week(s).   Specialty: Family Medicine Why: With repeat BMP Contact information: [address] [place] [address] [phone]         [place], [place]. Schedule an appointment as soon as possible for a visit in 1 week(s).   Contact information: [address] [place] [address] [phone]  Allergies  Allergen Reactions   Ace Inhibitors     Kidney failure   Atorvastatin Other (See Comments)    Arm pain   Prednisone     REACTION: Anxiety   Septra [Sulfamethoxazole-Trimethoprim]    Metformin And Related Diarrhea    Consultations: PCCM/cardiology/nephrology/IR    Procedures/Studies: IR Fluoro Guide CV Line Right Result Date: 05/20/2023 INDICATION: 58 year old with renal failure and needs a tunneled dialysis catheter. EXAM: FLUOROSCOPIC AND ULTRASOUND GUIDED PLACEMENT OF A TUNNELED DIALYSIS CATHETER Physician: Rachelle Hora. Lowella Dandy, MD MEDICATIONS: Ancef 2 g; The antibiotic was administered within an appropriate time interval prior to skin puncture. ANESTHESIA/SEDATION: Moderate (conscious) sedation was employed during this procedure. A total of Versed 0.5mg  and fentanyl 25 mcg was administered intravenously at the order of the provider performing the procedure. Total intra-service moderate sedation time: 23 minutes. Patient's level of consciousness and vital signs were monitored continuously by radiology nurse throughout the procedure under the supervision of the provider performing the procedure. FLUOROSCOPY TIME:  Radiation Exposure Index (as provided by the fluoroscopic device): 6 mGy Kerma COMPLICATIONS: None immediate. PROCEDURE: Informed consent was obtained for placement of a  tunneled dialysis catheter. The patient was placed supine on the interventional table. Ultrasound confirmed a patent right internal jugular vein. Ultrasound image obtained for documentation. The right neck and chest was prepped and draped in a sterile fashion. Maximal barrier sterile technique was utilized including caps, mask, sterile gowns, sterile gloves, sterile drape, hand hygiene and skin antiseptic. The right neck was anesthetized with 1% lidocaine. A small incision was made with #11 blade scalpel. A 21 gauge needle directed into the right internal jugular vein with ultrasound guidance. A micropuncture dilator set was placed. A 23 cm tip to cuff Palindrome catheter was selected. The skin below the right clavicle was anesthetized and a small incision was made with an #11 blade scalpel. A subcutaneous tunnel was formed to the vein dermatotomy site. The catheter was brought through the tunnel. The vein dermatotomy site was dilated to accommodate a peel-away sheath over a wire. The catheter was placed through the peel-away sheath and directed into the central venous structures. The tip of the catheter was placed at superior cavoatrial junction with fluoroscopy. Fluoroscopic images were obtained for documentation. Both lumens were found to aspirate and flush well. The proper amount of heparin was flushed in both lumens. The vein dermatotomy site was closed using a single layer of absorbable suture and Dermabond. The catheter was secured to the skin using Prolene suture. IMPRESSION: Successful placement of a right jugular tunneled dialysis catheter using ultrasound and fluoroscopic guidance. Electronically Signed   By: Richarda Overlie M.D.   On: 05/20/2023 07:37   IR US Guide Vasc Access Right Result Date: 05/20/2023 INDICATION: 58 year old with renal failure and needs a tunneled dialysis catheter. EXAM: FLUOROSCOPIC AND ULTRASOUND GUIDED PLACEMENT OF A TUNNELED DIALYSIS CATHETER Physician: Rachelle Hora. Lowella Dandy, MD  MEDICATIONS: Ancef 2 g; The antibiotic was administered within an appropriate time interval prior to skin puncture. ANESTHESIA/SEDATION: Moderate (conscious) sedation was employed during this procedure. A total of Versed 0.5mg  and fentanyl 25 mcg was administered intravenously at the order of the provider performing the procedure. Total intra-service moderate sedation time: 23 minutes. Patient's level of consciousness and vital signs were monitored continuously by radiology nurse throughout the procedure under the supervision of the provider performing the procedure. FLUOROSCOPY TIME:  Radiation Exposure Index (as provided by the fluoroscopic device): 6 mGy Kerma COMPLICATIONS: None immediate. PROCEDURE: Informed consent was obtained  for placement of a tunneled dialysis catheter. The patient was placed supine on the interventional table. Ultrasound confirmed a patent right internal jugular vein. Ultrasound image obtained for documentation. The right neck and chest was prepped and draped in a sterile fashion. Maximal barrier sterile technique was utilized including caps, mask, sterile gowns, sterile gloves, sterile drape, hand hygiene and skin antiseptic. The right neck was anesthetized with 1% lidocaine. A small incision was made with #11 blade scalpel. A 21 gauge needle directed into the right internal jugular vein with ultrasound guidance. A micropuncture dilator set was placed. A 23 cm tip to cuff Palindrome catheter was selected. The skin below the right clavicle was anesthetized and a small incision was made with an #11 blade scalpel. A subcutaneous tunnel was formed to the vein dermatotomy site. The catheter was brought through the tunnel. The vein dermatotomy site was dilated to accommodate a peel-away sheath over a wire. The catheter was placed through the peel-away sheath and directed into the central venous structures. The tip of the catheter was placed at superior cavoatrial junction with fluoroscopy.  Fluoroscopic images were obtained for documentation. Both lumens were found to aspirate and flush well. The proper amount of heparin was flushed in both lumens. The vein dermatotomy site was closed using a single layer of absorbable suture and Dermabond. The catheter was secured to the skin using Prolene suture. IMPRESSION: Successful placement of a right jugular tunneled dialysis catheter using ultrasound and fluoroscopic guidance. Electronically Signed   By: Richarda Overlie M.D.   On: 05/20/2023 07:37   DG CHEST PORT 1 VIEW Result Date: 05/19/2023 CLINICAL DATA:  Cough and shortness of breath. EXAM: PORTABLE CHEST 1 VIEW COMPARISON:  05/13/2023 FINDINGS: Previous left-sided dialysis catheter is been removed. There is a new right-sided dialysis catheter with tip at the atrial caval junction. Right upper extremity PICC tip is again seen. No pneumothorax. Mild cardiomegaly is similar. Ill-defined patchy bibasilar opacities, similar to prior exam. No pleural fluid. IMPRESSION: 1. Similar patchy bibasilar opacities. 2. Previous left-sided dialysis catheter is been removed. There is a new right-sided dialysis catheter with tip at the atrial caval junction. No pneumothorax. Electronically Signed   By: Narda Rutherford M.D.   On: 05/19/2023 18:20   CARDIAC CATHETERIZATION Result Date: 05/13/2023 HEMODYNAMICS: RA:   6 mmHg (mean) RV:   44/2-7 mmHg PA:   39/26 mmHg (33 mean) PCWP:  10 mmHg (mean)    Estimated Fick CO/CI   5.78 L/min, 2.3 L/min/m2 Thermodilution CO/CI   5 L/min, 2 L/min/m2     TPG    23  mmHg     PVR     4.6-4 Wood Units PAPi      2.2 IMPRESSION: Normal right and left heart filling pressures. 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 Moderately reduced cardiac output/index Aditya Sabharwal 3:19 PM   DG CHEST PORT 1 VIEW Result Date: 05/13/2023 CLINICAL DATA:  Respiratory failure. EXAM: PORTABLE CHEST 1 VIEW COMPARISON:  May 09, 2023. FINDINGS: Stable  cardiomediastinal silhouette. Endotracheal and nasogastric tubes have been removed. Stable left internal jugular catheter and right-sided PICC line. Stable bibasilar atelectasis or infiltrates. Bony thorax is unremarkable. IMPRESSION: Stable bibasilar atelectasis or infiltrates. Electronically Signed   By: Lupita Raider M.D.   On: 05/13/2023 12:18   ECHOCARDIOGRAM LIMITED Result Date: 05/12/2023    ECHOCARDIOGRAM LIMITED REPORT   Patient Name:   Dylan Owen Date of Exam: 05/12/2023 Medical Rec #:  [id number]         Height:       73.0 in Accession #:    [id number]        Weight:       275.1 lb Date of Birth:  [birth year]         BSA:          2.464 m Patient Age:    58 years          BP:           116/65 mmHg Patient Gender: M                 HR:           123 bpm. Exam Location:  Inpatient Procedure: Limited Echo, Limited Color Doppler and Intracardiac Opacification            Agent Indications:    CHF I50.9  History:        Patient has prior history of Echocardiogram examinations, most                 recent 05/05/2023. CHF, Angina; Risk Factors:Hypertension, Sleep                 Apnea, Diabetes, Dyslipidemia and Current Smoker.  Sonographer:    Lucendia Herrlich RCS Referring Phys: Roxy Horseman SIMMONS IMPRESSIONS  1. Left ventricular ejection fraction, by estimation, is 40 to 45%. The left ventricle has mildly decreased function. The left ventricle demonstrates regional wall motion abnormalities (see scoring diagram/findings for description). There is mild asymmetric left ventricular hypertrophy of the inferior segment.  2. The mitral valve is normal in structure. Trivial mitral valve regurgitation.  3. The aortic valve is normal in structure. Aortic valve sclerosis is present, with no evidence of aortic valve stenosis.  4. There is borderline dilatation of the ascending aorta, measuring 36 mm. FINDINGS  Left Ventricle: Left ventricular ejection fraction, by estimation, is 40 to 45%. The left ventricle has  mildly decreased function. The left ventricle demonstrates regional wall motion abnormalities. Definity contrast agent was given IV to delineate the left ventricular endocardial borders. There is mild asymmetric left ventricular hypertrophy of the inferior segment.  LV Wall Scoring: The apical inferior segment and apex are akinetic. The entire anterior wall, entire lateral wall, entire septum, and inferior wall are hypokinetic. Mitral Valve: The mitral valve is normal in structure. Trivial mitral valve regurgitation. Tricuspid Valve: The tricuspid valve is normal in structure. Tricuspid valve regurgitation is mild. Aortic Valve: The aortic valve is normal in structure. Aortic valve sclerosis is present, with no evidence of aortic valve stenosis. Aorta: There is borderline dilatation of the ascending aorta, measuring 36 mm. Additional Comments: A device lead is visualized.  LEFT VENTRICLE PLAX 2D LVIDd:         5.10 cm LVIDs:         4.10 cm LV PW:         1.20 cm LV IVS:        0.90 cm LVOT diam:     2.00 cm LVOT Area:     3.14 cm  LEFT ATRIUM         Index LA diam:    3.70 cm 1.50 cm/m   AORTA Ao Root diam: 3.50 cm Ao Asc diam:  3.60 cm  SHUNTS Systemic Diam: 2.00 cm Kardie Tobb DO Electronically signed by Thomasene Ripple DO Signature Date/Time: 05/12/2023/5:35:58 PM    Final    CT HEAD WO  CONTRAST ( ) Result Date: 05/12/2023 CLINICAL DATA:  58 year old male altered mental status, neurologic deficit. EXAM: CT HEAD WITHOUT CONTRAST TECHNIQUE: Contiguous axial images were obtained from the base of the skull through the vertex without intravenous contrast. RADIATION DOSE REDUCTION: This exam was performed according to the departmental dose-optimization program which includes automated exposure control, adjustment of the mA and/or kV according to patient size and/or use of iterative reconstruction technique. COMPARISON:  05/07/2023 head CT and earlier. FINDINGS: Brain: Patchy subcortical white matter hypodensity in the  anterior left frontal lobe appears stable since 05/06/2023. Mild additional patchy white matter hypodensity including in the bilateral internal capsules also stable. Small area of left caudate hypodensity is stable. No superimposed No midline shift, ventriculomegaly, mass effect, evidence of mass lesion, intracranial hemorrhage or evidence of cortically based acute infarction. Vascular: No suspicious intracranial vascular hyperdensity. Skull: Stable and intact. Sinuses/Orbits: Visualized paranasal sinuses and mastoids are stable and well aerated. Other: Left nasoenteric tube in place. Negative orbit and scalp soft tissues. IMPRESSION: 1. Stable non contrast CT appearance of cerebral white matter and left caudate changes since last month, most compatible with small vessel disease. 2.  No acute intracranial abnormality. Electronically Signed   By: Odessa Fleming M.D.   On: 05/12/2023 07:08   DG Abd Portable 1V Result Date: 05/11/2023 CLINICAL DATA:  Feeding tube placement EXAM: PORTABLE ABDOMEN - 1 VIEW COMPARISON:  05/11/2023 FINDINGS: Interval placement of non weighted enteric feeding tube, tip positioned near the descending duodenum. Nonobstructive pattern of included bowel gas. No acute osseous findings. IMPRESSION: Interval placement of non weighted enteric feeding tube, tip positioned near the descending duodenum. Electronically Signed   By: Jearld Lesch M.D.   On: 05/11/2023 12:01   DG Abd 1 View Result Date: 05/11/2023 CLINICAL DATA:  Ileus.  Sudden emesis. EXAM: ABDOMEN - 1 VIEW COMPARISON:  05/09/2023 FINDINGS: Gaseous distention of the stomach may indicate outlet obstruction or dysmotility. An enteric tube is present with tip projecting over the upper stomach. Gas-filled small and large bowel are not abnormally distended, likely ileus. No radiopaque stones. Degenerative changes and postoperative changes in the spine. IMPRESSION: 1. Gaseous distention of the stomach, possibly obstruction or dysmotility. 2.  Gas-filled nondistended small and large bowel are likely ileus. 3. Enteric tube tip projects over the upper stomach. Electronically Signed   By: Burman Nieves M.D.   On: 05/11/2023 01:05   DG Abd 1 View Result Date: 05/09/2023 CLINICAL DATA:  Orogastric tube placement. EXAM: ABDOMEN - 1 VIEW COMPARISON:  CT earlier today FINDINGS: Tip of the enteric tube is below the diaphragm in the stomach, the side port is not well visualized but likely also in the stomach. 1 gaseous gastric distension. No other bowel dilatation in the upper abdomen. IMPRESSION: Tip of the enteric tube below the diaphragm in the stomach, the side port is difficult to define but likely below the diaphragm. Electronically Signed   By: Narda Rutherford M.D.   On: 05/09/2023 18:40   CT CHEST ABDOMEN PELVIS WO CONTRAST Result Date: 05/09/2023 CLINICAL DATA:  Sepsis EXAM: CT CHEST, ABDOMEN AND PELVIS WITHOUT CONTRAST TECHNIQUE: Multidetector CT imaging of the chest, abdomen and pelvis was performed following the standard protocol without IV contrast. RADIATION DOSE REDUCTION: This exam was performed according to the departmental dose-optimization program which includes automated exposure control, adjustment of the mA and/or kV according to patient size and/or use of iterative reconstruction technique. COMPARISON:  Chest CT 12/23/2004 FINDINGS: CT CHEST FINDINGS Cardiovascular: Heart is mildly  enlarged. There is no pericardial effusion. Aorta is normal in size. Right upper extremity PICC terminates at the cavoatrial junction. Left-sided central venous catheter tip ends in the SVC. Mediastinum/Nodes: There is an enlarged subcarinal lymph node measuring 18 mm short axis. There are nonenlarged, but prominent paratracheal and prevascular lymph nodes. Difficult to assess for hilar adenopathy secondary to lack contrast. Enteric tube seen throughout a nondilated esophagus. Visualized thyroid gland is within normal limits. Endotracheal tube tip is 3.5 cm  above the carina. Lungs/Pleura: There is patchy airspace consolidation throughout the bilateral lower lobes. Multifocal ill-defined ground-glass, airspace and ill-defined nodular densities are seen throughout the right upper lobe. There is no pleural effusion or pneumothorax identified. Musculoskeletal: There are mild compression deformities of T10 and T11. T11 is stable from prior. Two hundred ten is new from 2006, but favored as chronic. CT ABDOMEN PELVIS FINDINGS Hepatobiliary: No focal liver abnormality is seen. No gallstones, gallbladder wall thickening, or biliary dilatation. Pancreas: Unremarkable. No pancreatic ductal dilatation or surrounding inflammatory changes. Spleen: Normal in size without focal abnormality. Adrenals/Urinary Tract: The bladder is decompressed by Foley catheter. There is mild fat stranding surrounding the bladder. The kidneys and adrenal glands are within normal limits. Stomach/Bowel: Stomach is within normal limits. Enteric tube tip is in the proximal body of the stomach. Appendix appears normal. No evidence of bowel wall thickening, distention, or inflammatory changes. Vascular/Lymphatic: Aortic atherosclerosis. No enlarged abdominal or pelvic lymph nodes. Reproductive: Prostate is unremarkable. Other: There small fat containing inguinal hernias. There is mild diffuse body wall edema. Musculoskeletal: L3-L5 posterior fusion hardware is present IMPRESSION: 1. Multifocal pneumonia. 2. Mediastinal adenopathy, likely reactive. 3. Mild fat stranding surrounding the bladder. Correlate clinically for cystitis. 4. Mild diffuse body wall edema. 5. Aortic atherosclerosis. Aortic Atherosclerosis (ICD10-I70.0). Electronically Signed   By: Darliss Cheney M.D.   On: 05/09/2023 18:17   Portable Chest x-ray Result Date: 05/09/2023 CLINICAL DATA:  Intubation. EXAM: PORTABLE CHEST 1 VIEW COMPARISON:  Chest radiograph dated 02/25. FINDINGS: Endotracheal tube with tip approximately 3.5 cm above the  carina. Enteric tube extends below the diaphragm with tip beyond the inferior margin of the image. Additional support a products in similar position. Evaluation of the lungs is limited due to overlying cover. Bilateral mid to lower lung field interstitial densities may represent atelectasis or infiltrate. No pleural effusion or pneumothorax. Stable cardiac silhouette. No acute osseous pathology. IMPRESSION: 1. Endotracheal tube with tip approximately 3.5 cm above the carina. 2. Bilateral mid to lower lung field atelectasis or infiltrate. Electronically Signed   By: Elgie Collard M.D.   On: 05/09/2023 14:20   DG CHEST PORT 1 VIEW Result Date: 05/08/2023 CLINICAL DATA:  Central line placement EXAM: PORTABLE CHEST 1 VIEW COMPARISON:  05/06/2023 FINDINGS: Interval placement of left internal jugular approach central venous catheter with distal tip terminating at the level of the distal SVC. Interval placement of a right PICC line with distal tip terminating at the level of the superior cavoatrial junction. Stable heart size. Pulmonary vascular congestion. Mildly prominent interstitial markings bilaterally. No pleural effusion or pneumothorax. IMPRESSION: 1. Interval placement of left IJ central venous catheter and right PICC line. No pneumothorax. 2. Pulmonary vascular congestion and mild interstitial edema. Electronically Signed   By: Duanne Guess D.O.   On: 05/08/2023 11:10   CT HEAD WO CONTRAST ( ) Result Date: 05/07/2023 CLINICAL DATA:  Neuro deficit, acute, stroke suspected EXAM: CT HEAD WITHOUT CONTRAST TECHNIQUE: Contiguous axial images were obtained from the base of the  skull through the vertex without intravenous contrast. RADIATION DOSE REDUCTION: This exam was performed according to the departmental dose-optimization program which includes automated exposure control, adjustment of the mA and/or kV according to patient size and/or use of iterative reconstruction technique. COMPARISON:  CT head  05/06/2023. FINDINGS: Brain: No evidence of acute infarction, hemorrhage, hydrocephalus, extra-axial collection or mass lesion/mass effect. Similar patchy white matter hypodensities, nonspecific but compatible with chronic microvascular disease. Vascular: No hyperdense vessel Skull: No acute fracture. Sinuses/Orbits: Mostly clear sinuses.  No acute orbital findings. Other: No mastoid effusions. IMPRESSION: No evidence of acute intracranial abnormality. An MRI could provide more sensitive evaluation for acute infarct if clinically warranted Electronically Signed   By: Feliberto Harts M.D.   On: 05/07/2023 22:32   [place] EKG Site Rite Result Date: 05/07/2023 If Site Rite image not attached, placement could not be confirmed due to current cardiac rhythm.  US ABDOMEN LIMITED WITH LIVER DOPPLER Result Date: 05/07/2023 CLINICAL DATA:  Elevated LFTs EXAM: DUPLEX ULTRASOUND OF LIVER TECHNIQUE: Color and duplex Doppler ultrasound was performed to evaluate the hepatic in-flow and out-flow vessels. COMPARISON:  None available FINDINGS: Liver: Normal parenchymal echogenicity. Normal hepatic contour without nodularity. No focal lesion, mass or intrahepatic biliary ductal dilatation. Gallbladder physiologically distended without stones or wall thickening. Main Portal Vein size: 1 cm Portal Vein Velocities (all hepatopetal): Main Prox:  15 cm/sec Main Mid: 17 cm/sec Main Dist:  16 cm/sec Right: 22 cm/sec Left: 22 cm/sec Hepatic Vein Velocities (all hepatofugal): Right:  20 cm/sec Middle:  48 cm/sec Left:  55 cm/sec IVC: Present and patent, velocity 23 cm/sec. Hepatic Artery Velocity: Not identified Splenic Vein Velocity:  21 cm/sec Spleen: 14.9 cm x 13.6 cm x 5.6 cm with a total volume of 597 cm^3 (411 cm^3 is upper limit normal) Portal Vein Occlusion/Thrombus: No Splenic Vein Occlusion/Thrombus: No Ascites: None Varices: None Technologist describes technically difficult study secondary to morbid obesity, overlying bowel gas,  patient inability to move, and breathing artifact. IMPRESSION: 1. Patent portal and hepatic veins and hepatic with normal direction of flow. 2. Splenomegaly. Electronically Signed   By: Corlis Leak M.D.   On: 05/07/2023 08:05   DG Chest Port 1 View Result Date: 05/06/2023 CLINICAL DATA:  Dyspnea EXAM: PORTABLE CHEST 1 VIEW COMPARISON:  05/04/2023 FINDINGS: Mild cardiomegaly. No focal airspace consolidation or pulmonary edema. No pleural effusion or pneumothorax. IMPRESSION: Mild cardiomegaly. Electronically Signed   By: Deatra Robinson M.D.   On: 05/06/2023 23:25   CT HEAD WO CONTRAST ( ) Result Date: 05/06/2023 CLINICAL DATA:  Altered mental status EXAM: CT HEAD WITHOUT CONTRAST TECHNIQUE: Contiguous axial images were obtained from the base of the skull through the vertex without intravenous contrast. RADIATION DOSE REDUCTION: This exam was performed according to the departmental dose-optimization program which includes automated exposure control, adjustment of the mA and/or kV according to patient size and/or use of iterative reconstruction technique. COMPARISON:  None Available. 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 No mass, hemorrhage or extra-axial collection. Vascular: No hyperdense vessel or unexpected vascular calcification. Skull: The visualized skull base, calvarium and extracranial soft tissues are normal. Sinuses/Orbits: Right maxillary sinus retention cyst. Other: None. IMPRESSION: 1. No acute intracranial abnormality. 2. 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 Electronically Signed   By: Deatra Robinson M.D.   On: 05/06/2023 20:36   ECHOCARDIOGRAM COMPLETE Result Date: 05/05/2023    ECHOCARDIOGRAM REPORT  Patient Name:   Dylan Owen [facility] Date of Exam: 05/05/2023 Medical Rec #:  [id number]         Height:       73.0 in Accession #:    [id number]        Weight:       328.0 lb Date of  Birth:  04-Jul-1965         BSA:          2.655 m Patient Age:    58 years          BP:           171/97 mmHg Patient Gender: M                 HR:           85 bpm. Exam Location:  Inpatient Procedure: 2D Echo, Color Doppler, Cardiac Doppler and Intracardiac            Opacification Agent Indications:    Congestive Heart Failure I50.9  History:        Patient has no prior history of Echocardiogram examinations.                 CHF; Risk Factors:Hypertension, Diabetes and HLD.  Sonographer:    Webb Laws Referring Phys: [id number] ARSHAD N KAKRAKANDY IMPRESSIONS  1. Cannot accurately estimate EF despite addition fo Definity contrast due to forshortened views and shadowing. The left ventricle demonstrates regional wall motion abnormalities (see scoring diagram/findings for description). There is hypokinesis of the apex and mid to distal anterior wall and anteroseptum.There is mild asymmetric left ventricular hypertrophy. Left ventricular diastolic parameters are indeterminate. Elevated left ventricular end-diastolic pressure.  2. Right ventricular systolic function is normal. The right ventricular size is normal. There is mildly elevated pulmonary artery systolic pressure. The estimated right ventricular systolic pressure is 39.4 mmHg.  3. The mitral valve is normal in structure. Mild mitral valve regurgitation. No evidence of mitral stenosis.  4. The aortic valve is normal in structure. Aortic valve regurgitation is not visualized. No aortic stenosis is present.  5. The inferior vena cava is dilated in size with <50% respiratory variability, suggesting right atrial pressure of 15 mmHg.  6. Consider repeat limited echo vs cardiac MRI for further assessment of EF FINDINGS  Left Ventricle: Cannot accurately estimate EF despite definity contrast due to poor image quality, forshortened views and shadowing. The left ventricle demonstrates regional wall motion abnormalities. Definity contrast agent was given IV to delineate  the left ventricular endocardial borders. The left ventricular internal cavity size was normal in size. There is mild asymmetric left ventricular hypertrophy. Left ventricular diastolic parameters are indeterminate. Elevated left ventricular end-diastolic pressure. Right Ventricle: The right ventricular size is normal. No increase in right ventricular wall thickness. Right ventricular systolic function is normal. There is mildly elevated pulmonary artery systolic pressure. The tricuspid regurgitant velocity is 2.47  m/s, and with an assumed right atrial pressure of 15 mmHg, the estimated right ventricular systolic pressure is 39.4 mmHg. Left Atrium: Left atrial size was normal in size. Right Atrium: Right atrial size was normal in size. Pericardium: There is no evidence of pericardial effusion. Mitral Valve: The mitral valve is normal in structure. Mild mitral valve regurgitation. No evidence of mitral valve stenosis. Tricuspid Valve: The tricuspid valve is normal in structure. Tricuspid valve regurgitation is mild . No evidence of tricuspid stenosis. Aortic Valve: The aortic valve is normal in structure. Aortic valve regurgitation is not visualized.  No aortic stenosis is present. Pulmonic Valve: The pulmonic valve was normal in structure. Pulmonic valve regurgitation is mild. No evidence of pulmonic stenosis. Aorta: The aortic root is normal in size and structure. Venous: The inferior vena cava is dilated in size with less than 50% respiratory variability, suggesting right atrial pressure of 15 mmHg. IAS/Shunts: No atrial level shunt detected by color flow Doppler.  LEFT VENTRICLE PLAX 2D LVIDd:         5.60 cm      Diastology LVIDs:         4.30 cm      LV e' medial:    4.24 cm/s LV PW:         1.20 cm      LV E/e' medial:  21.5 LV IVS:        1.20 cm      LV e' lateral:   6.31 cm/s LVOT diam:     2.00 cm      LV E/e' lateral: 14.5 LV SV:         27 LV SV Index:   10 LVOT Area:     3.14 cm  LV Volumes (MOD) LV  vol d, MOD A2C: 162.0 ml LV vol d, MOD A4C: 183.0 ml LV vol s, MOD A2C: 127.0 ml LV vol s, MOD A4C: 114.0 ml LV SV MOD A2C:     35.0 ml LV SV MOD A4C:     183.0 ml LV SV MOD BP:      56.9 ml RIGHT VENTRICLE            IVC RV S prime:     7.07 cm/s  IVC diam: 3.30 cm TAPSE (M-mode): 2.2 cm LEFT ATRIUM             Index        RIGHT ATRIUM           Index LA diam:        4.40 cm 1.66 cm/m   RA Area:     19.40 cm LA Vol (A2C):   38.0 ml 14.31 ml/m  RA Volume:   55.20 ml  20.79 ml/m LA Vol (A4C):   41.7 ml 15.71 ml/m LA Biplane Vol: 40.9 ml 15.40 ml/m  AORTIC VALVE             PULMONIC VALVE LVOT Vmax:   57.50 cm/s  PR End Diast Vel: 1.70 msec LVOT Vmean:  40.900 cm/s LVOT VTI:    0.086 m  AORTA Ao Root diam: 3.40 cm Ao Asc diam:  3.30 cm MITRAL VALVE               TRICUSPID VALVE MV Area (PHT): 9.37 cm    TR Peak grad:   24.4 mmHg MV Decel Time: 81 msec     TR Vmax:        247.00 cm/s MV E velocity: 91.30 cm/s MV A velocity: 59.10 cm/s  SHUNTS MV E/A ratio:  1.54        Systemic VTI:  0.09 m                            Systemic Diam: 2.00 cm Armanda Magic MD Electronically signed by Armanda Magic MD Signature Date/Time: 05/05/2023/1:20:27 PM    Final    VAS [place] LOWER EXTREMITY VENOUS (DVT) Result Date: 05/05/2023  Lower Venous DVT Study Patient Name:  Dylan Owen [facility]  Date of Exam:  05/05/2023 Medical Rec #: [id number]          Accession #:    [id number] Date of Birth: 01-16-1966          Patient Gender: M Patient Age:   50 years Exam Location:  [facility] Procedure:      VAS [place] LOWER EXTREMITY VENOUS (DVT) Referring Phys: Midge Minium --------------------------------------------------------------------------------  Indications: Edema.  Risk Factors: Obesity. Limitations: Body habitus and bandages. Comparison Study: None. Performing Technologist: Shona Simpson  Examination Guidelines: A complete evaluation includes B-mode imaging, spectral Doppler, color Doppler, and power Doppler as needed of all  accessible portions of each vessel. Bilateral testing is considered an integral part of a complete examination. Limited examinations for reoccurring indications may be performed as noted. The reflux portion of the exam is performed with the patient in reverse Trendelenburg.  +---------+---------------+---------+-----------+----------+-------------------+ RIGHT    CompressibilityPhasicitySpontaneityPropertiesThrombus Aging      +---------+---------------+---------+-----------+----------+-------------------+ CFV      Full           Yes      Yes                                      +---------+---------------+---------+-----------+----------+-------------------+ SFJ      Full                                                             +---------+---------------+---------+-----------+----------+-------------------+ FV Prox  Full                                                             +---------+---------------+---------+-----------+----------+-------------------+ FV Mid   Full                                                             +---------+---------------+---------+-----------+----------+-------------------+ FV DistalFull                                         Not well visualized +---------+---------------+---------+-----------+----------+-------------------+ PFV      Full                                                             +---------+---------------+---------+-----------+----------+-------------------+ POP      Full           Yes      Yes                                      +---------+---------------+---------+-----------+----------+-------------------+ PTV  Full                                                             +---------+---------------+---------+-----------+----------+-------------------+ PERO     Full                                         Not well visualized  +---------+---------------+---------+-----------+----------+-------------------+   +---------+---------------+---------+-----------+----------+-------------------+ LEFT     CompressibilityPhasicitySpontaneityPropertiesThrombus Aging      +---------+---------------+---------+-----------+----------+-------------------+ CFV      Full           Yes      Yes                                      +---------+---------------+---------+-----------+----------+-------------------+ SFJ      Full                                                             +---------+---------------+---------+-----------+----------+-------------------+ FV Prox  Full                                                             +---------+---------------+---------+-----------+----------+-------------------+ FV Mid   Full                                                             +---------+---------------+---------+-----------+----------+-------------------+ FV DistalFull                                         Not well visualized +---------+---------------+---------+-----------+----------+-------------------+ PFV      Full                                                             +---------+---------------+---------+-----------+----------+-------------------+ POP      Full           Yes      Yes                                      +---------+---------------+---------+-----------+----------+-------------------+ PTV      Full                                                             +---------+---------------+---------+-----------+----------+-------------------+  PERO                             Yes                  Not well visualized +---------+---------------+---------+-----------+----------+-------------------+     Summary: BILATERAL: - No evidence of deep vein thrombosis seen in the lower extremities, bilaterally. -No evidence of popliteal cyst, bilaterally.   *See  table(s) above for measurements and observations. Electronically signed by Sherald Hess MD on 05/05/2023 at 11:32:53 AM.    Final    DG Chest 2 View Result Date: 05/04/2023 CLINICAL DATA:  Short of breath EXAM: CHEST - 2 VIEW COMPARISON:  None Available. FINDINGS: Normal cardiac silhouette. Central venous congestion. Small pleural effusions. No overt pulmonary edema. No focal consolidation. No pneumothorax. No acute osseous abnormality. IMPRESSION: Central venous congestion and small pleural effusions. Electronically Signed   By: Genevive Bi M.D.   On: 05/04/2023 18:00      Subjective: Patient seen and examined at bedside.  Continues to make urine.  Denies worsening shortness breath, fever or vomiting.  Discharge Exam: Vitals:   05/28/23 1949 05/29/23 0516  BP: (!) 148/79 (!) 146/68  Pulse: 97 91  Resp: 18 16  Temp: 97.6 F (36.4 C) 97.9 F (36.6 C)  SpO2: (!) 89% 94%    General: Pt is alert, awake, not in acute distress.  On room air.  Chronically ill and deconditioned looking. Cardiovascular: rate controlled, S1/S2 + Respiratory: bilateral decreased breath sounds at bases with scattered crackles Abdominal: Soft, obese, NT, ND, bowel sounds + Extremities: Bilateral lower extremity edema; no cyanosis    The results of significant diagnostics from this hospitalization (including imaging, microbiology, ancillary and laboratory) are listed below for reference.     Microbiology: No results found for this or any previous visit (from the past 240 hours).   Labs: BNP (last 3 results) Recent Labs    05/04/23 1645  BNP 1,275.2*   Basic Metabolic Panel: Recent Labs  Lab 05/24/23 0324 05/25/23 0335 05/26/23 0354 05/27/23 0503 05/28/23 0311  NA 133* 139 138 140 142  K 5.0 4.8 4.5 4.8 4.6  CL 94* 101 101 101 97*  CO2 27 26 24 25 26   GLUCOSE 333* 148* 144* 299* 298*  BUN 77* 100* 107* 100* 96*  CREATININE 4.13* 4.51* 4.13* 3.82* 3.53*  CALCIUM 8.8* 9.2 8.7* 9.0  10.0  MG 1.9 2.0 1.9 2.0 1.8  PHOS 4.2 5.6* 6.2* 5.4* 5.3*   Liver Function Tests: Recent Labs  Lab 05/24/23 0324 05/25/23 0335 05/26/23 0354 05/27/23 0503 05/28/23 0311  ALBUMIN 2.3* 2.4* 2.3* 2.3* 2.4*   No results for input(s): "LIPASE", "AMYLASE" in the last 168 hours. No results for input(s): "AMMONIA" in the last 168 hours. CBC: Recent Labs  Lab 05/23/23 0316 05/28/23 0311  WBC 11.7* 8.7  NEUTROABS  --  5.4  HGB 10.1* 9.2*  HCT 31.8* 29.2*  MCV 82.8 86.6  PLT 315 308   Cardiac Enzymes: No results for input(s): "CKTOTAL", "CKMB", "CKMBINDEX", "TROPONINI" in the last 168 hours. BNP: Invalid input(s): "POCBNP" CBG: Recent Labs  Lab 05/28/23 0725 05/28/23 1130 05/28/23 1627 05/28/23 2120 05/29/23 0752  GLUCAP 328* 281* 222* 123* 183*   D-Dimer No results for input(s): "DDIMER" in the last 72 hours. Hgb A1c No results for input(s): "HGBA1C" in the last 72 hours. Lipid Profile No results for input(s): "CHOL", "HDL", "LDLCALC", "TRIG", "CHOLHDL", "LDLDIRECT" in  the last 72 hours. Thyroid function studies No results for input(s): "TSH", "T4TOTAL", "T3FREE", "THYROIDAB" in the last 72 hours.  Invalid input(s): "FREET3" Anemia work up No results for input(s): "VITAMINB12", "FOLATE", "FERRITIN", "TIBC", "IRON", "RETICCTPCT" in the last 72 hours. Urinalysis    Component Value Date/Time   COLORURINE RED (A) 05/07/2023 0900   APPEARANCEUR CLOUDY (A) 05/07/2023 0900   LABSPEC 1.020 05/07/2023 0900   PHURINE 7.0 05/07/2023 0900   GLUCOSEU 50 (A) 05/07/2023 0900   GLUCOSEU NEG mg/dL 16/01/9603 5409   HGBUR MODERATE (A) 05/07/2023 0900   HGBUR trace-lysed 11/29/2006 1045   BILIRUBINUR NEGATIVE 05/07/2023 0900   BILIRUBINUR negative 10/13/2010 0716   KETONESUR NEGATIVE 05/07/2023 0900   PROTEINUR 100 (A) 05/07/2023 0900   UROBILINOGEN 0.2 10/09/2011 1553   NITRITE NEGATIVE 05/07/2023 0900   LEUKOCYTESUR MODERATE (A) 05/07/2023 0900   Sepsis Labs Recent  Labs  Lab 05/23/23 0316 05/28/23 0311  WBC 11.7* 8.7   Microbiology No results found for this or any previous visit (from the past 240 hours).   Time coordinating discharge: 35 minutes  SIGNED:   Glade Lloyd, MD  [facility] 05/29/2023, 9:15 AM

## 2023-05-29 NOTE — Progress Notes (Signed)
 Patient ID: Dylan Owen, male   DOB: Oct 22, 1965, 58 y.o.   MRN: [id number] [facility] Progress Note   Assessment/ Plan:   1. Acute kidney Injury: With essentially normal renal function at baseline (creatinine 1.1) and suffered ATN in the setting of shock.  CRRT early on.  Now on state of recovery - s/p [facility] 2/13  - HD 2/17 last now recovering -IR unable to remove catheter this weekend.  Plan to remove outpatient -Continue to monitor for signs of recovery -As long as labs are okay today we will plan to sign off.  I set up labs for Wednesday and follow-up in our office in 2 to 3 weeks.  2.  Mixed cardiogenic/septic shock: Clinically suspected to be associated with viral myocarditis/acute CHF exacerbation.  Urine output improving.  Continue to monitor for recovery.  Lasix if needed.  Now off midodrine 3.  Shock liver: Has improved with hemodynamic support and time 4.  Acute hypoxic/hypercarbic respiratory failure: Now improved with ultrafiltration 5. Anemia-hemoglobin 9.2.  Continue to monitor. Hopefully will improve as he recovers 6.  Uncontrolled type 2 diabetes with hyperglycemia.  Management per primary team   Subjective:   Patient continues to feel well.  Good urine output.  Creatinine not back today.   Objective:   BP (!) 120/95 (BP Location: Right Arm)   Pulse (!) 101   Temp 98.3 F (36.8 C) (Oral)   Resp 17   Ht 6\' 1"  (1.854 m)   Wt (!) 139.1 kg   SpO2 91%   BMI 40.46 kg/m   Intake/Output Summary (Last 24 hours) at 05/29/2023 8295 Last data filed at 05/29/2023 0800 Gross per 24 hour  Intake 1200 ml  Output 2450 ml  Net -1250 ml   Weight change:   Physical Exam: Gen: Sitting in chair, no distress CVS: Tachycardia, no rub Resp: Bilateral chest rise with no increased work of breathing Abd: Soft, obese, nontender, bowel sounds normal Ext: Trace edema in the bilateral lower extremities, warm and well-perfused  Imaging: No results  found.    Labs: BMET Recent Labs  Lab 05/23/23 0316 05/24/23 0324 05/25/23 0335 05/26/23 0354 05/27/23 0503 05/28/23 0311  NA 133* 133* 139 138 140 142  K 5.5* 5.0 4.8 4.5 4.8 4.6  CL 94* 94* 101 101 101 97*  CO2 24 27 26 24 25 26   GLUCOSE 324* 333* 148* 144* 299* 298*  BUN 124* 77* 100* 107* 100* 96*  CREATININE 6.03* 4.13* 4.51* 4.13* 3.82* 3.53*  CALCIUM 8.7* 8.8* 9.2 8.7* 9.0 10.0  PHOS 5.8* 4.2 5.6* 6.2* 5.4* 5.3*   CBC Recent Labs  Lab 05/23/23 0316 05/28/23 0311  WBC 11.7* 8.7  NEUTROABS  --  5.4  HGB 10.1* 9.2*  HCT 31.8* 29.2*  MCV 82.8 86.6  PLT 315 308    Medications:     amiodarone  200 mg Oral Daily   apixaban  5 mg Oral BID   bacitracin   Topical BID   Chlorhexidine Gluconate Cloth  6 each Topical Daily   famotidine  20 mg Oral Daily   feeding supplement  237 mL Oral BID BM   insulin aspart  0-15 Units Subcutaneous TID WC   insulin aspart  5 Units Subcutaneous TID WC   insulin glargine-yfgn  15 Units Subcutaneous BID   lidocaine  1 patch Transdermal Q24H   methocarbamol  500 mg Oral BID   metoprolol succinate  12.5 mg Oral Daily   multivitamin  1 tablet  Oral QHS   nicotine  14 mg Transdermal Daily   oxyCODONE  10 mg Oral Q6H    Darnell Level  05/29/2023, 9:43 AM

## 2023-05-30 ENCOUNTER — Telehealth: Payer: Self-pay

## 2023-05-30 NOTE — Transitions of Care (Post Inpatient/ED Visit) (Signed)
   05/30/2023  Name: Dylan Owen MRN: [id number] DOB: [birth year]  Today's TOC FU Call Status: Today's TOC FU Call Status:: Unsuccessful Call (1st Attempt) Unsuccessful Call (1st Attempt) Date: 05/30/23  Attempted to reach the patient regarding the most recent Inpatient/ED visit.  Follow Up Plan: Additional outreach attempts will be made to reach the patient to complete the Transitions of Care (Post Inpatient/ED visit) call.   Lonia Chimera, RN, BSN, CEN [employer]- Transition of Care Team.  [facility] [phone]

## 2023-05-31 ENCOUNTER — Telehealth (HOSPITAL_COMMUNITY): Payer: Self-pay

## 2023-05-31 ENCOUNTER — Telehealth: Payer: Self-pay

## 2023-05-31 NOTE — Transitions of Care (Post Inpatient/ED Visit) (Signed)
   05/31/2023  Name: KAHLEN MORAIS MRN: [id number] DOB: [birth year]  Today's TOC FU Call Status: Today's TOC FU Call Status:: Unsuccessful Call (2nd Attempt) Unsuccessful Call (2nd Attempt) Date: 05/31/23  Attempted to reach the patient regarding the most recent Inpatient/ED visit.  Follow Up Plan: Additional outreach attempts will be made to reach the patient to complete the Transitions of Care (Post Inpatient/ED visit) call.   Lonia Chimera, RN, BSN, CEN [employer]- Transition of Care Team.  [facility] [phone]

## 2023-05-31 NOTE — Telephone Encounter (Signed)
 Called to schedule catheter removal, no answer, left vm. AB

## 2023-05-31 NOTE — Patient Instructions (Signed)

## 2023-06-01 ENCOUNTER — Telehealth: Payer: Self-pay

## 2023-06-01 ENCOUNTER — Encounter: Payer: Self-pay | Admitting: Family Medicine

## 2023-06-01 ENCOUNTER — Ambulatory Visit: Payer: PPO | Admitting: Family Medicine

## 2023-06-01 VITALS — BP 132/66 | HR 88 | Temp 97.9°F | Wt 301.8 lb

## 2023-06-01 DIAGNOSIS — I5021 Acute systolic (congestive) heart failure: Secondary | ICD-10-CM

## 2023-06-01 DIAGNOSIS — I480 Paroxysmal atrial fibrillation: Secondary | ICD-10-CM

## 2023-06-01 DIAGNOSIS — J9601 Acute respiratory failure with hypoxia: Secondary | ICD-10-CM

## 2023-06-01 DIAGNOSIS — Z794 Long term (current) use of insulin: Secondary | ICD-10-CM | POA: Diagnosis not present

## 2023-06-01 DIAGNOSIS — R5381 Other malaise: Secondary | ICD-10-CM | POA: Diagnosis not present

## 2023-06-01 DIAGNOSIS — N170 Acute kidney failure with tubular necrosis: Secondary | ICD-10-CM

## 2023-06-01 DIAGNOSIS — R570 Cardiogenic shock: Secondary | ICD-10-CM

## 2023-06-01 DIAGNOSIS — J9602 Acute respiratory failure with hypercapnia: Secondary | ICD-10-CM

## 2023-06-01 DIAGNOSIS — D649 Anemia, unspecified: Secondary | ICD-10-CM | POA: Diagnosis not present

## 2023-06-01 DIAGNOSIS — E1142 Type 2 diabetes mellitus with diabetic polyneuropathy: Secondary | ICD-10-CM

## 2023-06-01 MED ORDER — TRESIBA FLEXTOUCH 100 UNIT/ML ~~LOC~~ SOPN: 20.0000 [IU] | PEN_INJECTOR | Freq: Two times a day (BID) | SUBCUTANEOUS | Status: DC

## 2023-06-01 MED ORDER — NOVOLOG FLEXPEN 100 UNIT/ML ~~LOC~~ SOPN: PEN_INJECTOR | SUBCUTANEOUS | 0 refills | Status: DC

## 2023-06-01 MED ORDER — FREESTYLE LIBRE 3 PLUS SENSOR MISC: Status: DC

## 2023-06-01 NOTE — Progress Notes (Signed)
 06/01/2023  CC:  Chief Complaint  Patient presents with   Hospitalization Follow-up    Patient is a 58 y.o.  male who presents for  hospital follow up, specifically Transitional Care Services face-to-face visit. Dates hospitalized: 1/29 to 05/29/2023. Days since d/c from hospital: 3 days Patient was discharged from hospital to. Reason for admission to hospital: Acute congestive heart failure, prolonged viral syndrome, progressed to cardiogenic shock and respiratory failure. Date of interactive (phone) contact with patient and/or caregiver: unsuccessful attempts x 3 the last couple of days.  I have reviewed patient's discharge summary plus pertinent specific notes, labs, and imaging from the hospitalization.  He was admitted with acute congestive heart failure suspected to be from stress cardiomyopathy/sepsis.  His hospitalization was complicated by acute hypercarbic and hypoxic respiratory failure requiring mechanical ventilation.  Additionally, he had multifocal pneumonia and acute renal failure requiring hemodialysis, and new A-fib/flutter. He was treated with broad-spectrum antibiotics.  He was able to be extubated after several days, oxygen weaned to room air. Echocardiogram initially showed ejection fraction of 25%.  Repeat showed ejection fraction 40 to 45%.  He did require short-term vasopressors. He underwent DCCV on 05/13/23 and has been placed on amiodarone and Eliquis. After stopping hemodialysis his creatinine remained stable and he was making urine.  He was discharged home with hemodialysis catheter in place.  CURRENTLY: Tired but doing okay.  Minimal cough.  No fever.  No shortness of breath. He says his legs have began to swell again in the last couple of days since being home. He was not sure what dose of insulin to give himself so he has not been taking any in the last few days since going home. Glucoses have ranged 150s to 250s.  He is eating pretty well.  Discharge  medications: Amiodarone 200 mg daily, Eliquis 5 mg twice daily, Nexium 40 mg daily, Robaxin 500 mg twice daily as needed, Lopressor 50 mg twice a day, MS Contin 60 mg every morning and 30 mg in the afternoon and 60 mg at bedtime, NovoLog 5 units at supper, Repatha subcu q. 14 days, Tresiba 20 units twice a day. Upon discharge he was instructed to stop taking the following medications: lasix, HCTZ, lisinopril, and Effexor XR.  Medication reconciliation was done today and patient is not taking meds as recommended by discharging hospitalist/specialist (not taking insulin).    ROS as above, plus--> no CP,    no dizziness, no HAs, no rashes, no melena/hematochezia.  No polyuria or polydipsia.  No myalgias or arthralgias.  No focal weakness, paresthesias, or tremors.  No acute vision or hearing abnormalities.  No dysuria or unusual/new urinary urgency or frequency.   No n/v/d or abd pain.  No palpitations.    PMH:  Past Medical History:  Diagnosis Date   B12 deficiency    Blood transfusion without reported diagnosis    Chronic low back pain    Dr. Vear Clock is pain mgmt MD   Depression    Diabetes mellitus with complication (HCC)    DPN + microalbuminuria   GERD (gastroesophageal reflux disease)    Heart murmur    History of adenomatous polyp of colon 05/22/2021   recall 7 yrs   Hypercholesterolemia    Statin intolerant.  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  Persistent asthma    Never smoker   Seizures (HCC)    Stable angina (HCC)    nl myoview 12/07  followed by Dr Eden Emms   Statin intolerance    myalgias   Subclinical hypothyroidism 06/2017    PSH:  Past Surgical History:  Procedure Laterality Date   COLONOSCOPY  05/22/2021   Adenomas--recall 7 years   CYSTOSCOPY WITH RETROGRADE URETHROGRAM N/A  08/21/2021   Procedure: RETROGRADE URETHROGRAM;  Surgeon: Crist Fat, MD;  Location: [facility];  Service: Urology;  Laterality: N/A;   CYSTOSCOPY WITH URETHRAL DILATATION N/A 08/21/2021   Procedure: CYSTOSCOPY WITH URETHRAL DILATATION OPTILUME;  Surgeon: Crist Fat, MD;  Location: [facility];  Service: Urology;  Laterality: N/A;   IR FLUORO GUIDE CV LINE RIGHT  05/19/2023   IR US GUIDE VASC ACCESS RIGHT  05/19/2023   LUMBAR SPINE SURGERY     lumbar laminectomy with hardware stabilization, with ensuing arachnoiditis   RIGHT HEART CATH N/A 05/13/2023   Procedure: RIGHT HEART CATH;  Surgeon: Dorthula Nettles, DO;  Location: [facility];  Service: Cardiovascular;  Laterality: N/A;   URETHRAL STRICTURE DILATATION  1996 and 2004.   Laser surgery.    MEDS:  Outpatient Medications Prior to Visit  Medication Sig Dispense Refill   Acetaminophen (TYLENOL PO) Take 500 mg by mouth daily as needed (pain).     albuterol (VENTOLIN HFA) 108 (90 Base) MCG/ACT inhaler INHALE 1-2 PUFFS BY MOUTH EVERY 6 HOURS AS NEEDED FOR WHEEZE OR SHORTNESS OF BREATH 6.7 g 0   amiodarone (PACERONE) 200 MG tablet Take 1 tablet (200 mg total) by mouth daily. 30 tablet 0   apixaban (ELIQUIS) 5 MG TABS tablet Take 1 tablet (5 mg total) by mouth 2 (two) times daily. 60 tablet 0   B-D ULTRAFINE III SHORT PEN 31G X 8 MM MISC USE AS DIRECTED TWICE DAILY FOR MEDICATION INJECTION 100 each 1   blood glucose meter kit and supplies KIT Dispense based on patient and insurance preference. Use to check glucose 4 times daily. 1 each 0   Continuous Glucose Receiver (FREESTYLE LIBRE 3 READER) DEVI Use to check glucose continuously 2 each 3   Continuous Glucose Sensor (FREESTYLE LIBRE 3 SENSOR) MISC Place 1 sensor on the skin every 14 days. Use to check glucose continuously 2 each 3   esomeprazole (NEXIUM) 40 MG capsule Take 1 capsule (40 mg total) by mouth daily. 90 capsule 1   Evolocumab (REPATHA SURECLICK) 140 MG/ML SOAJ INJECT 1  DOSE INTO THE SKIN EVERY 14 (FOURTEEN) DAYS. 6 mL 3   FREESTYLE TEST STRIPS test strip USE TO CHECK GLUCOSE 4 TIMES DAILY. 200 strip 4   insulin aspart (NOVOLOG FLEXPEN) 100 UNIT/ML FlexPen USE 5 UNITS SUBCUTANEOUSLY AT SUPPER 15 mL 0   insulin degludec (TRESIBA FLEXTOUCH) 100 UNIT/ML FlexTouch Pen Inject 20 Units into the skin 2 (two) times daily.     methocarbamol (ROBAXIN) 500 MG tablet Take 500 mg by mouth 2 (two) times daily as needed for muscle spasms.     metoprolol tartrate (LOPRESSOR) 50 MG tablet TAKE 1 TABLET BY MOUTH TWICE A DAY. 60 tablet 0   morphine (MS CONTIN) 30 MG 12 hr tablet Take 30-60 mg by mouth See admin instructions. Taking 60 mg in the AM and 30 mg in the afternoon and 60 mg at bedtime     Multiple Vitamin (MULTIVITAMIN) tablet Take 1 tablet by mouth daily.     nitroGLYCERIN (NITROSTAT) 0.4 MG SL tablet  PLACE 1 TABLET UNDER THE TONGUE EVERY 5 MINUTES AS NEEDED. 25 tablet 1   No facility-administered medications prior to visit.    Physical Exam     06/01/2023    2:37 PM 05/29/2023    9:39 AM 05/29/2023    5:16 AM  Vitals with BMI  Weight 301 lbs 13 oz    BMI 39.83    Systolic 132 120 409  Diastolic 66 95 68  Pulse 88 101 91   General: Alert, chronically ill-appearing.  Affect is pleasant, thought and speech are lucid. He has a rolling walker. Cardiovascular: Regular rhythm and rate without murmur. Lungs are clear bilaterally without labored breathing. Extremities show 2+ bilateral lower extremity pitting edema. He has some superficial abrasions on both pretibial surfaces.   Pertinent labs/imaging Last CBC Lab Results  Component Value Date   WBC 8.7 05/28/2023   HGB 9.2 (L) 05/28/2023   HCT 29.2 (L) 05/28/2023   MCV 86.6 05/28/2023   MCH 27.3 05/28/2023   RDW 17.8 (H) 05/28/2023   PLT 308 05/28/2023   Last metabolic panel Lab Results  Component Value Date   GLUCOSE 163 (H) 05/29/2023   NA 140 05/29/2023   K 4.4 05/29/2023   CL 105 05/29/2023    CO2 24 05/29/2023   BUN 84 (H) 05/29/2023   CREATININE 3.09 (H) 05/29/2023   GFRNONAA 23 (L) 05/29/2023   CALCIUM 8.8 (L) 05/29/2023   PHOS 4.2 05/29/2023   PROT 5.9 (L) 05/16/2023   ALBUMIN 2.6 (L) 05/29/2023   BILITOT 2.0 (H) 05/16/2023   ALKPHOS 85 05/16/2023   AST 35 05/16/2023   ALT 40 05/16/2023   ANIONGAP 11 05/29/2023   Last hemoglobin A1c Lab Results  Component Value Date   HGBA1C 10.2 (H) 05/05/2023   Last thyroid functions Lab Results  Component Value Date   TSH 1.853 05/05/2023   T3TOTAL 127 10/18/2019   ASSESSMENT/PLAN:  #1 acute cardiogenic shock/stress CM, suspected from sepsis. Currently resolved, asymptomatic.  However, he is starting to develop some edema in both legs since going home and we discussed the importance of low-sodium intake again today. No diuretics at this time. He has follow-up appointment with cardiology on 06/09/2023.  2.  Acute hypoxic and hypercarbic respiratory failure--> D/T CHF, pneumonia, sepsis. Resolved status post ICU/ventilator.  He is off all oxygen and cough is minimal.  #3 new A-fib/flutter. Maintaining sinus rhythm status post DCCV in the hospital. Continue amiodarone 200 mg a day, Lopressor 50 mg twice daily, and Eliquis 5 mg twice daily. Checking electrolytes and CBC today.  4.  Acute kidney injury requiring short-term hemodialysis. He showed recovery/stability in the hospital, most recent creatinine 3.09 on 05/29/2023.  He is set to get his catheter removed by interventional radiology in 2 days. Monitor serum creatinine and electrolytes today.  5.  Diabetes with peripheral neuropathy, poor control, long-term insulin therapy. Needs to get back on his insulin.  He was on high doses prior to the hospitalization. Will get him restarted with Tresiba 15 units twice a day.  Also restart NovoLog 5 units with each meal.  #6 normocytic anemia. He has anemia of chronic disease. CBC today.  7.  Debilitation/physical  deconditioning. Home health PT and OT will be set up this week.  Medical decision making of high complexity was utilized today.  FOLLOW UP: 1 week  Signed:  Santiago Bumpers, MD           06/01/2023

## 2023-06-01 NOTE — Transitions of Care (Post Inpatient/ED Visit) (Signed)
   06/01/2023  Name: Dylan Owen MRN: [id number] DOB: 10-Sep-1965  Today's TOC FU Call Status: Today's TOC FU Call Status:: Unsuccessful Call (3rd Attempt) Unsuccessful Call (3rd Attempt) Date: 06/01/23  Attempted to reach the patient regarding the most recent Inpatient/ED visit.  Follow Up Plan: No further outreach attempts will be made at this time. We have been unable to contact the patient.  Lonia Chimera, RN, BSN, CEN [employer]- Transition of Care Team.  [facility] [phone]

## 2023-06-02 LAB — CBC WITH DIFFERENTIAL/PLATELET
Basophils Absolute: 0.2 10*3/uL — ABNORMAL HIGH (ref 0.0–0.1)
Basophils Relative: 1.7 % (ref 0.0–3.0)
Eosinophils Absolute: 1.1 10*3/uL — ABNORMAL HIGH (ref 0.0–0.7)
Eosinophils Relative: 9.8 % — ABNORMAL HIGH (ref 0.0–5.0)
HCT: 31.4 % — ABNORMAL LOW (ref 39.0–52.0)
Hemoglobin: 9.8 g/dL — ABNORMAL LOW (ref 13.0–17.0)
Lymphocytes Relative: 14.5 % (ref 12.0–46.0)
Lymphs Abs: 1.6 10*3/uL (ref 0.7–4.0)
MCHC: 31.1 g/dL (ref 30.0–36.0)
MCV: 88.9 fl (ref 78.0–100.0)
Monocytes Absolute: 1.6 10*3/uL — ABNORMAL HIGH (ref 0.1–1.0)
Monocytes Relative: 14.1 % — ABNORMAL HIGH (ref 3.0–12.0)
Neutro Abs: 6.6 10*3/uL (ref 1.4–7.7)
Neutrophils Relative %: 59.9 % (ref 43.0–77.0)
Platelets: 389 10*3/uL (ref 150.0–400.0)
RBC: 3.53 Mil/uL — ABNORMAL LOW (ref 4.22–5.81)
RDW: 19.8 % — ABNORMAL HIGH (ref 11.5–15.5)
WBC: 11 10*3/uL — ABNORMAL HIGH (ref 4.0–10.5)

## 2023-06-02 LAB — COMPREHENSIVE METABOLIC PANEL
ALT: 19 U/L (ref 0–53)
AST: 19 U/L (ref 0–37)
Albumin: 3.3 g/dL — ABNORMAL LOW (ref 3.5–5.2)
Alkaline Phosphatase: 65 U/L (ref 39–117)
BUN: 61 mg/dL — ABNORMAL HIGH (ref 6–23)
CO2: 24 meq/L (ref 19–32)
Calcium: 8.6 mg/dL (ref 8.4–10.5)
Chloride: 108 meq/L (ref 96–112)
Creatinine, Ser: 2.72 mg/dL — ABNORMAL HIGH (ref 0.40–1.50)
GFR: 25.22 mL/min — ABNORMAL LOW (ref >60.00)
Glucose, Bld: 232 mg/dL — ABNORMAL HIGH (ref 70–99)
Potassium: 4.4 meq/L (ref 3.5–5.1)
Sodium: 142 meq/L (ref 135–145)
Total Bilirubin: 1 mg/dL (ref 0.2–1.2)
Total Protein: 7.6 g/dL (ref 6.0–8.3)

## 2023-06-03 ENCOUNTER — Telehealth: Payer: Self-pay | Admitting: Pharmacy Technician

## 2023-06-03 ENCOUNTER — Ambulatory Visit (HOSPITAL_COMMUNITY)
Admission: RE | Admit: 2023-06-03 | Discharge: 2023-06-03 | Disposition: A | Discharge status: Home / Self Care | Payer: PPO | Source: Ambulatory Visit | Attending: Student | Admitting: Student

## 2023-06-03 ENCOUNTER — Other Ambulatory Visit (HOSPITAL_COMMUNITY): Payer: Self-pay

## 2023-06-03 DIAGNOSIS — Z452 Encounter for adjustment and management of vascular access device: Secondary | ICD-10-CM | POA: Diagnosis not present

## 2023-06-03 DIAGNOSIS — N179 Acute kidney failure, unspecified: Secondary | ICD-10-CM | POA: Diagnosis not present

## 2023-06-03 DIAGNOSIS — Z4901 Encounter for fitting and adjustment of extracorporeal dialysis catheter: Secondary | ICD-10-CM | POA: Diagnosis not present

## 2023-06-03 HISTORY — PX: IR REMOVAL TUN CV CATH W/O FL: IMG2289

## 2023-06-03 MED ORDER — LIDOCAINE-EPINEPHRINE 1 %-1:100000 IJ SOLN: 20.0000 mL | Freq: Once | INTRAMUSCULAR | Status: DC

## 2023-06-03 MED ORDER — LIDOCAINE-EPINEPHRINE 1 %-1:100000 IJ SOLN
INTRAMUSCULAR | Status: AC
  Filled 2023-06-03: qty 1

## 2023-06-03 NOTE — Procedures (Signed)
 Interventional Radiology Procedure Note  Procedure: Removal of a right IJ approach tunneled hd cath.  Complications: None  Recommendations:  - Ok to shower tomorrow - Do not submerge for 7 days - Routine wound care  Signed,  Yvone Neu. Loreta Ave, DO

## 2023-06-03 NOTE — Telephone Encounter (Signed)
 Pharmacy Patient Advocate Encounter   Received notification from CoverMyMeds that prior authorization for repatha is required/requested.   Insurance verification completed.   The patient is insured through [facility] ADVANTAGE/RX ADVANCE .   Per test claim: PA required; PA submitted to above mentioned insurance via CoverMyMeds Key/confirmation #/EOC [id number] Status is pending

## 2023-06-06 ENCOUNTER — Other Ambulatory Visit (HOSPITAL_COMMUNITY): Payer: Self-pay

## 2023-06-06 ENCOUNTER — Ambulatory Visit (HOSPITAL_COMMUNITY): Payer: PPO

## 2023-06-06 NOTE — Telephone Encounter (Signed)
 Pharmacy Patient Advocate Encounter  Received notification from [facility] ADVANTAGE/RX ADVANCE that Prior Authorization for repatha has been APPROVED from 06/03/23 to 06/02/24   PA #/Case ID/Reference #: [id number]

## 2023-06-09 ENCOUNTER — Encounter (HOSPITAL_COMMUNITY): Payer: Self-pay

## 2023-06-09 ENCOUNTER — Ambulatory Visit (HOSPITAL_COMMUNITY)
Admit: 2023-06-09 | Discharge: 2023-06-09 | Disposition: A | Discharge status: Home / Self Care | Payer: PPO | Source: Ambulatory Visit | Attending: Cardiology | Admitting: Cardiology

## 2023-06-09 VITALS — BP 118/60 | HR 68 | Wt 312.8 lb

## 2023-06-09 DIAGNOSIS — I480 Paroxysmal atrial fibrillation: Secondary | ICD-10-CM | POA: Diagnosis not present

## 2023-06-09 DIAGNOSIS — E1165 Type 2 diabetes mellitus with hyperglycemia: Secondary | ICD-10-CM | POA: Diagnosis not present

## 2023-06-09 DIAGNOSIS — G4733 Obstructive sleep apnea (adult) (pediatric): Secondary | ICD-10-CM

## 2023-06-09 DIAGNOSIS — R21 Rash and other nonspecific skin eruption: Secondary | ICD-10-CM | POA: Insufficient documentation

## 2023-06-09 DIAGNOSIS — I5042 Chronic combined systolic (congestive) and diastolic (congestive) heart failure: Secondary | ICD-10-CM

## 2023-06-09 DIAGNOSIS — I4892 Unspecified atrial flutter: Secondary | ICD-10-CM | POA: Diagnosis not present

## 2023-06-09 DIAGNOSIS — R5381 Other malaise: Secondary | ICD-10-CM | POA: Insufficient documentation

## 2023-06-09 DIAGNOSIS — I272 Pulmonary hypertension, unspecified: Secondary | ICD-10-CM | POA: Diagnosis not present

## 2023-06-09 DIAGNOSIS — E1142 Type 2 diabetes mellitus with diabetic polyneuropathy: Secondary | ICD-10-CM | POA: Diagnosis not present

## 2023-06-09 DIAGNOSIS — F17220 Nicotine dependence, chewing tobacco, uncomplicated: Secondary | ICD-10-CM | POA: Diagnosis not present

## 2023-06-09 DIAGNOSIS — Z794 Long term (current) use of insulin: Secondary | ICD-10-CM | POA: Diagnosis not present

## 2023-06-09 DIAGNOSIS — Z7901 Long term (current) use of anticoagulants: Secondary | ICD-10-CM | POA: Diagnosis not present

## 2023-06-09 DIAGNOSIS — N179 Acute kidney failure, unspecified: Secondary | ICD-10-CM | POA: Insufficient documentation

## 2023-06-09 DIAGNOSIS — E785 Hyperlipidemia, unspecified: Secondary | ICD-10-CM | POA: Diagnosis not present

## 2023-06-09 DIAGNOSIS — I110 Hypertensive heart disease with heart failure: Secondary | ICD-10-CM | POA: Insufficient documentation

## 2023-06-09 LAB — BRAIN NATRIURETIC PEPTIDE: B Natriuretic Peptide: 1807.4 pg/mL — ABNORMAL HIGH (ref 0.0–100.0)

## 2023-06-09 LAB — BASIC METABOLIC PANEL
Anion gap: 8 (ref 5–15)
BUN: 37 mg/dL — ABNORMAL HIGH (ref 6–20)
CO2: 23 mmol/L (ref 22–32)
Calcium: 8.7 mg/dL — ABNORMAL LOW (ref 8.9–10.3)
Chloride: 110 mmol/L (ref 98–111)
Creatinine, Ser: 1.66 mg/dL — ABNORMAL HIGH (ref 0.61–1.24)
GFR, Estimated: 48 mL/min — ABNORMAL LOW (ref >60)
Glucose, Bld: 263 mg/dL — ABNORMAL HIGH (ref 70–99)
Potassium: 5.2 mmol/L — ABNORMAL HIGH (ref 3.5–5.1)
Sodium: 141 mmol/L (ref 135–145)

## 2023-06-09 MED ORDER — TORSEMIDE 20 MG PO TABS: 40.0000 mg | ORAL_TABLET | Freq: Two times a day (BID) | ORAL | 3 refills | Status: DC

## 2023-06-09 MED ORDER — METOLAZONE 2.5 MG PO TABS: 2.5000 mg | ORAL_TABLET | ORAL | 2 refills | Status: AC

## 2023-06-09 NOTE — Progress Notes (Signed)
 ReDS Vest / Clip - 06/09/23 1011       ReDS Vest / Clip   Station Marker D    Ruler Value 42    ReDS Value Range Low volume    ReDS Actual Value 32

## 2023-06-09 NOTE — Progress Notes (Signed)
 ADVANCED HF CLINIC CONSULT NOTE  Referring Physician: Jeoffrey Massed, MD Primary Care: Jeoffrey Massed, MD Primary Cardiologist: None HF Cardiologist: Dr. Elwyn Lade  Chief Complaint: [facility] Follow-up HPI: Dylan Owen is a 58 yo male with PMH of obesity, HTN, HLD, DM2, OSA, PAF, and recent admission for new diagnosis heart failure and cardiogenic shock.   Admitted 05/04/23 to ICU with septic shock after presenting with complaints of myalgias, dizziness, and sore throat. BNP and trops mildly elevated. Initial echo showed EF 25%, however images were poor, likely in the setting of viral myocarditis. He was started on Milrinone and Lasix, however developed AKI that progressed to anuria requiring CRRT then iHD. Repeat echo 2/6 showed EF 40-45% with RWMA. RHC 2/7 with normal R and L filling pressures, elevated PA and PVR (likely Group II + III), and mod reduced CO/CI. renal function improved and he was liberated from [facility] and discharged. Discharge weight 306 lbs.  Today he returns for post hospital follow up with his daughter. His wife is currently in the hospital. Overall feeling poor. Has been gaining fluid since discharge. He is up around 15lbs. BLE are weeping. Reports rash on BLE and lower back. Is frustrated by current condition, wants to feel better. Discharge summary specified follow up with pulmonology and nephrology, however refers and/or appointments not documented in chart. No palpitations, abnormal bleeding, CP, or dizziness Appetite ok. No fever or chills. Taking all medications.   Urgent [facility] appointment made.   Past Medical History:  Diagnosis Date   B12 deficiency    Chronic low back pain    Dr. Vear Clock is pain mgmt MD   Depression    Diabetes mellitus with complication (HCC)    DPN + microalbuminuria   GERD (gastroesophageal reflux disease)    History of acute renal failure    Required hemodialysis in hospital for cardiogenic shock 05/2023   History  of adenomatous polyp of colon 05/22/2021   recall 7 yrs   Hypercholesterolemia    Statin intolerant.  Started Repatha 10/2021   Hypertension    Nonischemic cardiomyopathy (HCC)    Acute heart failure due to sepsis/shock.  EF 40 to 45% 05/2023.   OSA (obstructive sleep apnea)    on nighttime home O2 (refuses to wear CPAP because of claustrophobia)   PAF (paroxysmal atrial fibrillation) (HCC)    Initial diagnosis during hospitalization for shock 05/2023.  DCCV successful in hospital.  Discharged home on amiodarone and Eliquis.   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 + chronic lumbar radiculopathy   Persistent asthma    Never smoker   Seizures (HCC)    Statin intolerance    myalgias   Subclinical hypothyroidism 06/2017    Current Outpatient Medications  Medication Sig Dispense Refill   Acetaminophen (TYLENOL PO) Take 500 mg by mouth daily as needed (pain).     albuterol (VENTOLIN HFA) 108 (90 Base) MCG/ACT inhaler INHALE 1-2 PUFFS BY MOUTH EVERY 6 HOURS AS NEEDED FOR WHEEZE OR SHORTNESS OF BREATH 6.7 g 0   amiodarone (PACERONE) 200 MG tablet Take 1 tablet (200 mg total) by mouth daily. 30 tablet 0   apixaban (ELIQUIS) 5 MG TABS tablet Take 1 tablet (5 mg total) by mouth 2 (two) times daily. 60 tablet 0   B-D ULTRAFINE III SHORT PEN 31G X 8 MM MISC USE AS DIRECTED TWICE DAILY FOR MEDICATION INJECTION 100 each 1   blood glucose meter kit and supplies KIT Dispense based on patient  and insurance preference. Use to check glucose 4 times daily. 1 each 0   Continuous Glucose Receiver (FREESTYLE LIBRE 3 READER) DEVI Use to check glucose continuously 2 each 3   Continuous Glucose Sensor (FREESTYLE LIBRE 3 PLUS SENSOR) MISC Change sensor every 15 days. 2 each    esomeprazole (NEXIUM) 40 MG capsule Take 1 capsule (40 mg total) by mouth daily. 90 capsule 1   Evolocumab (REPATHA SURECLICK) 140 MG/ML SOAJ INJECT 1 DOSE INTO THE SKIN EVERY 14 (FOURTEEN) DAYS. 6 mL 3   FREESTYLE TEST STRIPS test strip USE  TO CHECK GLUCOSE 4 TIMES DAILY. 200 strip 4   insulin aspart (NOVOLOG FLEXPEN) 100 UNIT/ML FlexPen USE 5 UNITS SUBCUTANEOUSLY WITH BF, LUNCH, AND SUPPER 15 mL 0   insulin degludec (TRESIBA FLEXTOUCH) 100 UNIT/ML FlexTouch Pen Inject 20 Units into the skin 2 (two) times daily. 15 U SQ bid     methocarbamol (ROBAXIN) 500 MG tablet Take 500 mg by mouth 2 (two) times daily as needed for muscle spasms.     metolazone (ZAROXOLYN) 2.5 MG tablet Take 1 tablet (2.5 mg total) by mouth as directed. Only take after you have spoken to the Heart Failure Clinic 15 tablet 2   metoprolol tartrate (LOPRESSOR) 50 MG tablet TAKE 1 TABLET BY MOUTH TWICE A DAY. 60 tablet 0   morphine (MS CONTIN) 30 MG 12 hr tablet Take 30-60 mg by mouth See admin instructions. Taking 60 mg in the AM and 30 mg in the afternoon and 60 mg at bedtime     Multiple Vitamin (MULTIVITAMIN) tablet Take 1 tablet by mouth daily.     nitroGLYCERIN (NITROSTAT) 0.4 MG SL tablet PLACE 1 TABLET UNDER THE TONGUE EVERY 5 MINUTES AS NEEDED. 25 tablet 1   torsemide (DEMADEX) 20 MG tablet Take 2 tablets (40 mg total) by mouth 2 (two) times daily. 180 tablet 3   No current facility-administered medications for this encounter.    Allergies  Allergen Reactions   Ace Inhibitors     Kidney failure   Atorvastatin Other (See Comments)    Arm pain   Prednisone     REACTION: Anxiety   Septra [Sulfamethoxazole-Trimethoprim]    Metformin And Related Diarrhea     Social History   Socioeconomic History   Marital status: Married    Spouse name: Not on file   Number of children: Not on file   Years of education: Not on file   Highest education level: GED or equivalent  Occupational History   Not on file  Tobacco Use   Smoking status: Never   Smokeless tobacco: Current    Types: Chew   Tobacco comments:    dip  Vaping Use   Vaping status: Never Used  Substance and Sexual Activity   Alcohol use: No   Drug use: No   Sexual activity: Yes  Other  Topics Concern   Not on file  Social History Narrative   Married, 4 children.   Occupation: disabled since 2000.     Prior to disability he worked for [employer].   Orig from [place].   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  No hx of drug abuse.   Chews tobacco since age 19 yrs.   Regular exercise:  No               Social Drivers of Corporate investment banker Strain: High Risk (12/05/2022)  Overall Financial Resource Strain (CARDIA)    Difficulty of Paying Living Expenses: Very hard  Food Insecurity: No Food Insecurity (05/05/2023)   Hunger Vital Sign    Worried About Running Out of Food in the Last Year: Never true    Ran Out of Food in the Last Year: Never true  Recent Concern: Food Insecurity - Food Insecurity Present (05/05/2023)   Hunger Vital Sign    Worried About Running Out of Food in the Last Year: Never true    Ran Out of Food in the Last Year: Sometimes true  Transportation Needs: No Transportation Needs (05/05/2023)   PRAPARE - Administrator, Civil Service (Medical): No    Lack of Transportation (Non-Medical): No  Physical Activity: Unknown (12/05/2022)   Exercise Vital Sign    Days of Exercise per Week: 0 days    Minutes of Exercise per Session: Not on file  Stress: Stress Concern Present (12/05/2022)   [employer] of Occupational Health - Occupational Stress Questionnaire    Feeling of Stress : To some extent  Social Connections: Moderately Isolated (05/05/2023)   Social Connection and Isolation Panel [NHANES]    Frequency of Communication with Friends and Family: Three times a week    Frequency of Social Gatherings with Friends and Family: Never    Attends Religious Services: Never    Database administrator or Organizations: No    Attends Banker Meetings: Never    Marital Status: Married  Catering manager Violence: Not At Risk (05/05/2023)   Humiliation,  Afraid, Rape, and Kick questionnaire    Fear of Current or Ex-Partner: No    Emotionally Abused: No    Physically Abused: No    Sexually Abused: No      Family History  Problem Relation Age of Onset   Cancer Paternal Grandfather        prostate   Heart disease Other    Hypertension Other    Other Other        Emotional Illness   Colon cancer Neg Hx    Esophageal cancer Neg Hx    Rectal cancer Neg Hx    Stomach cancer Neg Hx     Vitals:   06/09/23 1011  BP: 118/60  Pulse: 68  SpO2: 97%  Weight: (!) 141.9 kg (312 lb 12.8 oz)    Filed Weights   06/09/23 1011  Weight: (!) 141.9 kg (312 lb 12.8 oz)    PHYSICAL EXAM: General: Well appearing. No distress on RA. Arrived in [facility].  Cardiac: JVP to jaw. S1 and S2 present. No murmurs or rub. Abdomen: Taut, distended Extremities: Warm and dry. BLE and back rash. Looks like antibiotic rash (prev on abx).  3-4+ edema with weeping. Neuro: Alert and oriented x3. Affect pleasant.  ECG: NSR 73 bpm, Pri 184 ms (personally reviewed)  ReDs reading: 32 %, normal  ASSESSMENT & PLAN: HFmrEF:  - Suspect stress CM in setting of sepsis - Echo 1/30: Unable to estimate EF d/t image quality, RWMA - Echo 2/06: EF 40-45%, RWMA, RV reduced - RHC 2/7: CI of 2-2.3 in AF s/p DCCV - NYHA III-IV. Significantly volume overloaded. - Start Torsemide 40 mg bid. Will send with PRN Metolazone 2.5 mg, with instructions to take when advised by [facility]. If renal function and K stable will have take a dose x 2 days. - Last creatinine 2.72, has not been seen by Nephrology. Urgent appointment made with [facility]. - Stop Lopressor with  volume overload and worsening NYHA.  - GDMT limited by renal recovery, would not lower BP any further without post-op creatinine. - Attempt to add at follow up visit as able.  - Instructed to ACE wrap lower extremities - BMET/BNP today   PAH Hx OSA - RHC with Group II + III - Refer to Pulmonology - need sleep study -  need PFTs   PAF/Flutter - s/p DCCV on 05/13/23 - In NSR on ECG today - continue amiodarone 200mg  bid - continue Eliquis 5 mg bid    AKI:  - required CRRT>iHD during recent admission. ATN 2/2 mixed shock - last Cr 2.72, continues to improve (baseline unknown) - referral not sent at discharge. Called [facility] for urgent appointment - needs aggressive diuresis, watch renal function closely - avoid hypotension   Diabetes: poorly controlled, A1c 10.2 . -on insulin per PCP  Rash - BLE and lower back rash - only new medication is amio and eliquis - worsened by edema - reassess at next visit with diuresis  Deconditioned  - HH PT/OT   Follow up in 1 week with APP for volume check and start GDMT as able.  [place] Tedd Cottrill, NP 06/09/23

## 2023-06-09 NOTE — Patient Instructions (Signed)
 START Torsemide 40 mg Twice daily  Take Metolazone 2.5 mg only as directed by the heart failure clinic.  Labs done today, your results will be available in MyChart, we will contact you for abnormal readings.   You have been referred to Pulmonology and Nephology. These offices will call you to arrange your appointments.  Do the following things EVERYDAY: Weigh yourself in the morning before breakfast. Write it down and keep it in a log. Take your medicines as prescribed Eat low salt foods--Limit salt (sodium) to 2000 mg per day.  Stay as active as you can everyday Limit all fluids for the day to less than 2 liters  ACE wraps for leg compressions.  Your physician recommends that you schedule a follow-up appointment in: 1 week.  If you have any questions or concerns before your next appointment please send [place] a message through [place] or call our office at [phone].    TO LEAVE A MESSAGE FOR THE NURSE SELECT OPTION 2, PLEASE LEAVE A MESSAGE INCLUDING: YOUR NAME DATE OF BIRTH CALL BACK NUMBER REASON FOR CALL**this is important as we prioritize the call backs  YOU WILL RECEIVE A CALL BACK THE SAME DAY AS LONG AS YOU CALL BEFORE 4:00 PM  At the [facility], you and your health needs are our priority. As part of our continuing mission to provide you with exceptional heart care, we have created designated Provider Care Teams. These Care Teams include your primary Cardiologist (physician) and Advanced Practice Providers (APPs- Physician Assistants and Nurse Practitioners) who all work together to provide you with the care you need, when you need it.   You may see any of the following providers on your designated Care Team at your next follow up: Dr Arvilla Meres Dr Marca Ancona Dr. Dorthula Nettles Dr. Clearnce Hasten Amy Filbert Schilder, NP Robbie Lis, [facility] [place], [place] Brynda Peon, NP [place] Lee, NP Clarisa Kindred, NP Karle Plumber,  PharmD Enos Fling, PharmD   Please be sure to bring in all your medications bottles to every appointment.    Thank you for choosing [facility]

## 2023-06-10 ENCOUNTER — Ambulatory Visit: Payer: PPO | Admitting: Family Medicine

## 2023-06-10 DIAGNOSIS — M545 Low back pain, unspecified: Secondary | ICD-10-CM | POA: Diagnosis not present

## 2023-06-10 DIAGNOSIS — I700 Atherosclerosis of aorta: Secondary | ICD-10-CM | POA: Diagnosis not present

## 2023-06-10 DIAGNOSIS — I083 Combined rheumatic disorders of mitral, aortic and tricuspid valves: Secondary | ICD-10-CM | POA: Diagnosis not present

## 2023-06-10 DIAGNOSIS — N390 Urinary tract infection, site not specified: Secondary | ICD-10-CM | POA: Diagnosis not present

## 2023-06-10 DIAGNOSIS — E02 Subclinical iodine-deficiency hypothyroidism: Secondary | ICD-10-CM | POA: Diagnosis not present

## 2023-06-10 DIAGNOSIS — D649 Anemia, unspecified: Secondary | ICD-10-CM | POA: Diagnosis not present

## 2023-06-10 DIAGNOSIS — I4891 Unspecified atrial fibrillation: Secondary | ICD-10-CM | POA: Diagnosis not present

## 2023-06-10 DIAGNOSIS — I4892 Unspecified atrial flutter: Secondary | ICD-10-CM | POA: Diagnosis not present

## 2023-06-10 DIAGNOSIS — J45998 Other asthma: Secondary | ICD-10-CM | POA: Diagnosis not present

## 2023-06-10 DIAGNOSIS — S80921D Unspecified superficial injury of right lower leg, subsequent encounter: Secondary | ICD-10-CM | POA: Diagnosis not present

## 2023-06-10 DIAGNOSIS — E1142 Type 2 diabetes mellitus with diabetic polyneuropathy: Secondary | ICD-10-CM | POA: Diagnosis not present

## 2023-06-10 DIAGNOSIS — J9601 Acute respiratory failure with hypoxia: Secondary | ICD-10-CM | POA: Diagnosis not present

## 2023-06-10 DIAGNOSIS — I272 Pulmonary hypertension, unspecified: Secondary | ICD-10-CM | POA: Diagnosis not present

## 2023-06-10 DIAGNOSIS — E538 Deficiency of other specified B group vitamins: Secondary | ICD-10-CM | POA: Diagnosis not present

## 2023-06-10 DIAGNOSIS — G8929 Other chronic pain: Secondary | ICD-10-CM | POA: Diagnosis not present

## 2023-06-10 DIAGNOSIS — J9602 Acute respiratory failure with hypercapnia: Secondary | ICD-10-CM | POA: Diagnosis not present

## 2023-06-10 DIAGNOSIS — I110 Hypertensive heart disease with heart failure: Secondary | ICD-10-CM | POA: Diagnosis not present

## 2023-06-10 DIAGNOSIS — J15211 Pneumonia due to Methicillin susceptible Staphylococcus aureus: Secondary | ICD-10-CM | POA: Diagnosis not present

## 2023-06-10 DIAGNOSIS — E1165 Type 2 diabetes mellitus with hyperglycemia: Secondary | ICD-10-CM | POA: Diagnosis not present

## 2023-06-10 DIAGNOSIS — G4733 Obstructive sleep apnea (adult) (pediatric): Secondary | ICD-10-CM | POA: Diagnosis not present

## 2023-06-10 DIAGNOSIS — I5041 Acute combined systolic (congestive) and diastolic (congestive) heart failure: Secondary | ICD-10-CM | POA: Diagnosis not present

## 2023-06-10 DIAGNOSIS — E7800 Pure hypercholesterolemia, unspecified: Secondary | ICD-10-CM | POA: Diagnosis not present

## 2023-06-10 DIAGNOSIS — N179 Acute kidney failure, unspecified: Secondary | ICD-10-CM | POA: Diagnosis not present

## 2023-06-10 DIAGNOSIS — I2089 Other forms of angina pectoris: Secondary | ICD-10-CM | POA: Diagnosis not present

## 2023-06-15 NOTE — Progress Notes (Incomplete)
 ADVANCED HF CLINIC CONSULT NOTE  Referring Physician: Jeoffrey Massed, MD Primary Care: Jeoffrey Massed, MD Primary Cardiologist: None HF Cardiologist: Dr. Elwyn Lade  Chief Complaint: [facility] Follow-up HPI: Dylan Owen is a 58 yo male with PMH of obesity, HTN, HLD, DM2, OSA, PAF, and recent admission for new diagnosis heart failure and cardiogenic shock.   Admitted 05/04/23 to ICU with septic shock after presenting with complaints of myalgias, dizziness, and sore throat. BNP and trops mildly elevated. Initial echo showed EF 25%, however images were poor, likely in the setting of viral myocarditis. He was started on Milrinone and Lasix, however developed AKI that progressed to anuria requiring CRRT then iHD. Repeat echo 2/6 showed EF 40-45% with RWMA. RHC 2/7 with normal R and L filling pressures, elevated PA and PVR (likely Group II + III), and mod reduced CO/CI. renal function improved and he was liberated from [facility] and discharged. Discharge weight 306 lbs.  AHF follow up earlier this month he was up 15lbs and overall not feeling well.  Today he returns for AHF follow up. Weight down 18lbs since last visit. Overall feeling ok, complaining of neck pain. Denies palpitations, CP, dizziness, or PND/Orthopnea. Has some mild swelling in BLE's. No SOB. Appetite ok. No fever or chills. Weight at home 294-298 pounds. Taking all medications. Denies ETOH, tobacco or drug use. Tries to avoid salty foods. Has been drinking <64 oz day.   Past Medical History:  Diagnosis Date   B12 deficiency    Chronic low back pain    Dr. Vear Clock is pain mgmt MD   Depression    Diabetes mellitus with complication (HCC)    DPN + microalbuminuria   GERD (gastroesophageal reflux disease)    History of acute renal failure    Required hemodialysis in hospital for cardiogenic shock 05/2023   History of adenomatous polyp of colon 05/22/2021   recall 7 yrs   Hypercholesterolemia    Statin intolerant.  Started  Repatha 10/2021   Hypertension    Nonischemic cardiomyopathy (HCC)    Acute heart failure due to sepsis/shock.  EF 40 to 45% 05/2023.   OSA (obstructive sleep apnea)    on nighttime home O2 (refuses to wear CPAP because of claustrophobia)   PAF (paroxysmal atrial fibrillation) (HCC)    Initial diagnosis during hospitalization for shock 05/2023.  DCCV successful in hospital.  Discharged home on amiodarone and Eliquis.   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     albuterol (VENTOLIN HFA) 108 (90 Base) MCG/ACT inhaler INHALE 1-2 PUFFS BY MOUTH EVERY 6 HOURS AS NEEDED FOR WHEEZE OR SHORTNESS OF BREATH 6.7 g 0   amiodarone (PACERONE) 200 MG tablet Take 1 tablet (200 mg total) by mouth daily. 30 tablet 0   apixaban (ELIQUIS) 5 MG TABS tablet Take 1 tablet (5 mg total) by mouth 2 (two) times daily. 60 tablet 0   B-D ULTRAFINE III SHORT PEN 31G X 8 MM MISC USE AS DIRECTED TWICE DAILY FOR MEDICATION INJECTION 100 each 1   blood glucose meter kit and supplies KIT Dispense based on patient and insurance preference. Use to check glucose 4 times daily. 1 each  0   Continuous Glucose Receiver (FREESTYLE LIBRE 3 READER) DEVI Use to check glucose continuously 2 each 3   Continuous Glucose Sensor (FREESTYLE LIBRE 3 PLUS SENSOR) MISC Change sensor every 15 days. 2 each    esomeprazole (NEXIUM) 40 MG capsule Take 1 capsule (40 mg total) by mouth daily. 90 capsule 1   Evolocumab (REPATHA SURECLICK) 140 MG/ML SOAJ INJECT 1 DOSE INTO THE SKIN EVERY 14 (FOURTEEN) DAYS. 6 mL 3   FREESTYLE TEST STRIPS test strip USE TO CHECK GLUCOSE 4 TIMES DAILY. 200 strip 4   insulin aspart (NOVOLOG FLEXPEN) 100 UNIT/ML FlexPen USE 5 UNITS  SUBCUTANEOUSLY WITH BF, LUNCH, AND SUPPER 15 mL 0   insulin degludec (TRESIBA FLEXTOUCH) 100 UNIT/ML FlexTouch Pen Inject 20 Units into the skin 2 (two) times daily. 15 U SQ bid     methocarbamol (ROBAXIN) 500 MG tablet Take 500 mg by mouth 2 (two) times daily as needed for muscle spasms.     metolazone (ZAROXOLYN) 2.5 MG tablet Take 1 tablet (2.5 mg total) by mouth as directed. Only take after you have spoken to the Heart Failure Clinic 15 tablet 2   metoprolol tartrate (LOPRESSOR) 50 MG tablet TAKE 1 TABLET BY MOUTH TWICE A DAY. 60 tablet 0   morphine (MS CONTIN) 30 MG 12 hr tablet Take 30-60 mg by mouth See admin instructions. Taking 60 mg in the AM and 30 mg in the afternoon and 60 mg at bedtime     Multiple Vitamin (MULTIVITAMIN) tablet Take 1 tablet by mouth daily.     nitroGLYCERIN (NITROSTAT) 0.4 MG SL tablet PLACE 1 TABLET UNDER THE TONGUE EVERY 5 MINUTES AS NEEDED. 25 tablet 1   torsemide (DEMADEX) 20 MG tablet Take 2 tablets (40 mg total) by mouth 2 (two) times daily. 180 tablet 3   No current facility-administered medications for this encounter.   Allergies  Allergen Reactions   Ace Inhibitors     Kidney failure   Atorvastatin Other (See Comments)    Arm pain   Prednisone     REACTION: Anxiety   Septra [Sulfamethoxazole-Trimethoprim]    Metformin And Related Diarrhea   Social History   Socioeconomic History   Marital status: Married    Spouse name: Not on file   Number of children: Not on file   Years of education: Not on file   Highest education level: GED or equivalent  Occupational History   Not on file  Tobacco Use   Smoking status: Never   Smokeless tobacco: Current    Types: Chew   Tobacco comments:    dip  Vaping Use   Vaping status: Never Used  Substance and Sexual Activity   Alcohol use: No   Drug use: No   Sexual activity: Yes  Other Topics Concern   Not on file  Social History Narrative   Married, 4 children.   Occupation: disabled since 2000.      Prior to disability he worked for [employer].   Orig from [place].   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  No hx of drug abuse.   Chews tobacco since age 45 yrs.   Regular exercise:  No               Social Drivers of Corporate investment banker Strain: High Risk (12/05/2022)   Overall Financial Resource Strain (CARDIA)    Difficulty of Paying Living Expenses:  Very hard  Food Insecurity: No Food Insecurity (05/05/2023)   Hunger Vital Sign    Worried About Running Out of Food in the Last Year: Never true    Ran Out of Food in the Last Year: Never true  Recent Concern: Food Insecurity - Food Insecurity Present (05/05/2023)   Hunger Vital Sign    Worried About Running Out of Food in the Last Year: Never true    Ran Out of Food in the Last Year: Sometimes true  Transportation Needs: No Transportation Needs (05/05/2023)   PRAPARE - Administrator, Civil Service (Medical): No    Lack of Transportation (Non-Medical): No  Physical Activity: Unknown (12/05/2022)   Exercise Vital Sign    Days of Exercise per Week: 0 days    Minutes of Exercise per Session: Not on file  Stress: Stress Concern Present (12/05/2022)   [employer] of Occupational Health - Occupational Stress Questionnaire    Feeling of Stress : To some extent  Social Connections: Moderately Isolated (05/05/2023)   Social Connection and Isolation Panel [NHANES]    Frequency of Communication with Friends and Family: Three times a week    Frequency of Social Gatherings with Friends and Family: Never    Attends Religious Services: Never    Database administrator or Organizations: No    Attends Banker Meetings: Never    Marital Status: Married  Catering manager Violence: Not At Risk (05/05/2023)   Humiliation, Afraid, Rape, and Kick questionnaire    Fear of Current or Ex-Partner: No    Emotionally Abused: No    Physically  Abused: No    Sexually Abused: No   Family History  Problem Relation Age of Onset   Cancer Paternal Grandfather        prostate   Heart disease Other    Hypertension Other    Other Other        Emotional Illness   Colon cancer Neg Hx    Esophageal cancer Neg Hx    Rectal cancer Neg Hx    Stomach cancer Neg Hx    Filed Weights   06/16/23 1456  Weight: 133.4 kg (294 lb 3.2 oz)    PHYSICAL EXAM: General:  elderly appearing.  No respiratory difficulty. Arrived sitting on his walker. Generalized scabs.  Neck: supple. JVD difficult to see.  Cor: PMI nondisplaced. Regular rate & rhythm. No rubs, gallops or murmurs. Lungs: clear, diminished bases Extremities: no cyanosis, clubbing, rash, +1-2 BLE edema  Neuro: alert & oriented x 3. Moves all 4 extremities w/o difficulty. Affect pleasant.   Wt Readings from Last 3 Encounters:  06/16/23 133.4 kg (294 lb 3.2 oz)  06/09/23 (!) 141.9 kg (312 lb 12.8 oz)  06/01/23 (!) 136.9 kg (301 lb 12.8 oz)    ECG: NSR 73 bpm, pr 184 ms (personally reviewed from 06/09/23)  ReDs reading: 38 %, abnormal   ASSESSMENT & PLAN: HFmrEF:  - Suspect stress CM in setting of sepsis - Echo 1/30: Unable to estimate EF d/t image quality, RWMA - Echo 2/06: EF 40-45%, RWMA, RV reduced - RHC 2/7: CI of 2-2.3 in AF s/p DCCV - NYHA III-IV. Significantly volume overloaded. - Continue Torsemide 40 mg bid. Responded very well to PRN metolazone use. Volume remains mildly elevated but down 18 lbs. Will continue current regimen.  - Off Lopressor with volume overload and worsening NYHA.  - GDMT limited by renal recovery, would not lower BP any further to allow for  renal recovery. - Start Farxiga 10 mg daily. BMET/BNP today. Repeat BMET 1 week.  - repeat echo at next visit   PAH Hx OSA - RHC with Group II + III - Has been referred to Pulmonology - Will arrange sleep study  - May need PFTs    PAF/Flutter - s/p DCCV on 05/13/23 - In NSR on ECG 3/25 - continue  amiodarone 200mg  bid - continue Eliquis 5 mg bid    Renal impairment:   - required CRRT>iHD during recent admission. ATN 2/2 mixed shock - last Cr 1.66, continues to improve (baseline unknown) - referral not sent at discharge. Called [facility] for urgent appointment at last visit. He said he hasn't heard from them. RN called [facility] and they said they have called multiple times and sent a letter for him to schedule. Will pass message along to patient.  - avoid hypotension   Diabetes: poorly controlled, A1c 10.2 . -on insulin per PCP  Rash - BLE, arms and lower back rash - only new medication is amio and eliquis - worsened by edema. Improving today. Scabbed over and not erythematous   Deconditioned  - HH PT/OT   Follow up with Dr. Elwyn Lade in 2 months +echo.   Alen Bleacher, NP 06/16/23

## 2023-06-16 ENCOUNTER — Encounter (HOSPITAL_COMMUNITY): Payer: Self-pay

## 2023-06-16 ENCOUNTER — Other Ambulatory Visit (HOSPITAL_COMMUNITY): Payer: Self-pay

## 2023-06-16 ENCOUNTER — Telehealth (HOSPITAL_COMMUNITY): Payer: Self-pay

## 2023-06-16 ENCOUNTER — Ambulatory Visit (HOSPITAL_COMMUNITY)
Admission: RE | Admit: 2023-06-16 | Discharge: 2023-06-16 | Disposition: A | Discharge status: Home / Self Care | Source: Ambulatory Visit | Attending: Internal Medicine | Admitting: Internal Medicine

## 2023-06-16 ENCOUNTER — Other Ambulatory Visit (HOSPITAL_COMMUNITY): Payer: Self-pay | Admitting: Cardiology

## 2023-06-16 VITALS — BP 130/60 | HR 82 | Wt 294.2 lb

## 2023-06-16 DIAGNOSIS — E118 Type 2 diabetes mellitus with unspecified complications: Secondary | ICD-10-CM

## 2023-06-16 DIAGNOSIS — R21 Rash and other nonspecific skin eruption: Secondary | ICD-10-CM | POA: Diagnosis not present

## 2023-06-16 DIAGNOSIS — I5042 Chronic combined systolic (congestive) and diastolic (congestive) heart failure: Secondary | ICD-10-CM | POA: Diagnosis not present

## 2023-06-16 DIAGNOSIS — I2721 Secondary pulmonary arterial hypertension: Secondary | ICD-10-CM | POA: Diagnosis not present

## 2023-06-16 DIAGNOSIS — R570 Cardiogenic shock: Secondary | ICD-10-CM | POA: Diagnosis not present

## 2023-06-16 DIAGNOSIS — Z79899 Other long term (current) drug therapy: Secondary | ICD-10-CM | POA: Insufficient documentation

## 2023-06-16 DIAGNOSIS — I480 Paroxysmal atrial fibrillation: Secondary | ICD-10-CM | POA: Diagnosis not present

## 2023-06-16 DIAGNOSIS — Z794 Long term (current) use of insulin: Secondary | ICD-10-CM | POA: Insufficient documentation

## 2023-06-16 DIAGNOSIS — M542 Cervicalgia: Secondary | ICD-10-CM | POA: Diagnosis not present

## 2023-06-16 DIAGNOSIS — I110 Hypertensive heart disease with heart failure: Secondary | ICD-10-CM | POA: Diagnosis not present

## 2023-06-16 DIAGNOSIS — Z5986 Financial insecurity: Secondary | ICD-10-CM | POA: Diagnosis not present

## 2023-06-16 DIAGNOSIS — E119 Type 2 diabetes mellitus without complications: Secondary | ICD-10-CM | POA: Diagnosis not present

## 2023-06-16 DIAGNOSIS — I5022 Chronic systolic (congestive) heart failure: Secondary | ICD-10-CM | POA: Diagnosis not present

## 2023-06-16 DIAGNOSIS — R5381 Other malaise: Secondary | ICD-10-CM | POA: Diagnosis not present

## 2023-06-16 DIAGNOSIS — N289 Disorder of kidney and ureter, unspecified: Secondary | ICD-10-CM | POA: Diagnosis not present

## 2023-06-16 DIAGNOSIS — K766 Portal hypertension: Secondary | ICD-10-CM

## 2023-06-16 DIAGNOSIS — G4733 Obstructive sleep apnea (adult) (pediatric): Secondary | ICD-10-CM | POA: Diagnosis not present

## 2023-06-16 DIAGNOSIS — Z7901 Long term (current) use of anticoagulants: Secondary | ICD-10-CM | POA: Diagnosis not present

## 2023-06-16 LAB — BASIC METABOLIC PANEL
Anion gap: 11 (ref 5–15)
BUN: 43 mg/dL — ABNORMAL HIGH (ref 6–20)
CO2: 29 mmol/L (ref 22–32)
Calcium: 8.4 mg/dL — ABNORMAL LOW (ref 8.9–10.3)
Chloride: 97 mmol/L — ABNORMAL LOW (ref 98–111)
Creatinine, Ser: 1.59 mg/dL — ABNORMAL HIGH (ref 0.61–1.24)
GFR, Estimated: 50 mL/min — ABNORMAL LOW (ref >60)
Glucose, Bld: 418 mg/dL — ABNORMAL HIGH (ref 70–99)
Potassium: 3.7 mmol/L (ref 3.5–5.1)
Sodium: 137 mmol/L (ref 135–145)

## 2023-06-16 LAB — BRAIN NATRIURETIC PEPTIDE: B Natriuretic Peptide: 951.1 pg/mL — ABNORMAL HIGH (ref 0.0–100.0)

## 2023-06-16 MED ORDER — DAPAGLIFLOZIN PROPANEDIOL 10 MG PO TABS: 10.0000 mg | ORAL_TABLET | Freq: Every day | ORAL | 11 refills | Status: DC

## 2023-06-16 NOTE — Telephone Encounter (Signed)
 Advanced Heart Failure Patient Advocate Encounter  Test billing for Jardiance and Marcelline Deist both show $0 copay for 90 day supply.  Burnell Blanks, CPhT Rx Patient Advocate Phone: [phone]

## 2023-06-16 NOTE — Patient Instructions (Addendum)
 Start Farxiga 10 mg daily - Rx sent. Repeat lab in 7 - 10 days. See below. Referral sent for your Sleep Study - they will call you to schedule. See below. Return to see Dr. Elwyn Lade in 2 months with echo - see below. Please call us at [phone] if any questions or concerns prior to your next visit.    PLEASE CALL [facility] AT [phone] TO SCHEDULE YOUR APPOINTMENT. THEY HAVE BEEN TRYING TO CALL YOU AND HAVE SENT A LETTER WITHOUT RESPONSE FROM YOU.

## 2023-06-16 NOTE — Progress Notes (Signed)
 ReDS Vest / Clip - 06/16/23 1500       ReDS Vest / Clip   Station Marker D    Ruler Value 33    ReDS Value Range Low volume    ReDS Actual Value 38

## 2023-06-18 ENCOUNTER — Other Ambulatory Visit: Payer: Self-pay | Admitting: Family Medicine

## 2023-06-19 ENCOUNTER — Other Ambulatory Visit: Payer: Self-pay | Admitting: Family Medicine

## 2023-06-21 DIAGNOSIS — Z79891 Long term (current) use of opiate analgesic: Secondary | ICD-10-CM | POA: Diagnosis not present

## 2023-06-21 DIAGNOSIS — E1142 Type 2 diabetes mellitus with diabetic polyneuropathy: Secondary | ICD-10-CM | POA: Diagnosis not present

## 2023-06-21 DIAGNOSIS — M961 Postlaminectomy syndrome, not elsewhere classified: Secondary | ICD-10-CM | POA: Diagnosis not present

## 2023-06-21 DIAGNOSIS — G894 Chronic pain syndrome: Secondary | ICD-10-CM | POA: Diagnosis not present

## 2023-06-23 ENCOUNTER — Ambulatory Visit (INDEPENDENT_AMBULATORY_CARE_PROVIDER_SITE_OTHER): Admitting: Family Medicine

## 2023-06-23 ENCOUNTER — Encounter: Payer: Self-pay | Admitting: Family Medicine

## 2023-06-23 VITALS — BP 106/50 | HR 89 | Temp 98.1°F | Wt 294.6 lb

## 2023-06-23 DIAGNOSIS — D649 Anemia, unspecified: Secondary | ICD-10-CM

## 2023-06-23 DIAGNOSIS — R21 Rash and other nonspecific skin eruption: Secondary | ICD-10-CM | POA: Diagnosis not present

## 2023-06-23 DIAGNOSIS — R5381 Other malaise: Secondary | ICD-10-CM

## 2023-06-23 DIAGNOSIS — N179 Acute kidney failure, unspecified: Secondary | ICD-10-CM

## 2023-06-23 DIAGNOSIS — E1140 Type 2 diabetes mellitus with diabetic neuropathy, unspecified: Secondary | ICD-10-CM | POA: Diagnosis not present

## 2023-06-23 DIAGNOSIS — I428 Other cardiomyopathies: Secondary | ICD-10-CM | POA: Diagnosis not present

## 2023-06-23 DIAGNOSIS — Z794 Long term (current) use of insulin: Secondary | ICD-10-CM

## 2023-06-23 MED ORDER — CETIRIZINE HCL 10 MG PO TABS: ORAL_TABLET | ORAL | 0 refills | Status: DC

## 2023-06-23 NOTE — Patient Instructions (Signed)
 Make sure you are not taking metoprolol.  I sent in a prescription for zyrtec, which should help with your skin itching.

## 2023-06-23 NOTE — Progress Notes (Signed)
 OFFICE VISIT  06/23/2023  CC:  Chief Complaint  Patient presents with   Medical Management of Chronic Issues    Patient is a 58 y.o. male who presents for 3-week follow-up diabetes, heart failure, kidney failure, and debilitation/physical deconditioning.  INTERIM HX: Dylan Owen is feeling better and better.  His energy level is coming back.  He can ambulate about 20 feet without stopping to rest.  He is getting home health PT. Appetite is good.  He says his glucoses are "good" but he really can only give me the #150 as an example.  He does not have his CGM sensor on today and cannot pull up anything on his phone. He is taking 20 units of Tresiba twice a day and 5 units of NovoLog with each meal.  CHF clinic following him, prescribed farxiga 1 wk ago but he was unable to afford this. He is not sure if he is ff lopressor.  He has continued on torsemide 40mg  bid, metolazone prn.   Has been referred to pulm for pulm HTN/OSA. Still on amiodarone and eliquis for PAF, maintaining SR since DCCV in hosp 05/13/23. Repeat echo is planned in about 2 mo.   Kidney function improving.  On 06/16/2023 his GFR was up to 50, serum creatinine 1.59. Electrolytes normal. He has an appointment set with a nephrologist. He has a extensive pinkish maculopapular rash on arms, lower back, and some on the extremities.  These have some punctate scabs now.  They do itch.  Past Medical History:  Diagnosis Date   B12 deficiency    Chronic low back pain    Dr. Vear Clock is pain mgmt MD   Depression    Diabetes mellitus with complication (HCC)    DPN + microalbuminuria   GERD (gastroesophageal reflux disease)    History of acute renal failure    Required hemodialysis in hospital for cardiogenic shock 05/2023   History of adenomatous polyp of colon 05/22/2021   recall 7 yrs   Hypercholesterolemia    Statin intolerant.  Started Repatha 10/2021   Hypertension    Nonischemic cardiomyopathy (HCC)    Acute heart failure  due to sepsis/shock.  EF 40 to 45% 05/2023.   OSA (obstructive sleep apnea)    on nighttime home O2 (refuses to wear CPAP because of claustrophobia)   PAF (paroxysmal atrial fibrillation) (HCC)    Initial diagnosis during hospitalization for shock 05/2023.  DCCV successful in hospital.  Discharged home on amiodarone and Eliquis.   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 + chronic lumbar radiculopathy   Persistent asthma    Never smoker   Seizures (HCC)    Statin intolerance    myalgias   Subclinical hypothyroidism 06/2017    Past Surgical History:  Procedure Laterality Date   COLONOSCOPY  05/22/2021   Adenomas--recall 7 years   CYSTOSCOPY WITH RETROGRADE URETHROGRAM N/A 08/21/2021   Procedure: RETROGRADE URETHROGRAM;  Surgeon: Crist Fat, MD;  Location: [facility];  Service: Urology;  Laterality: N/A;   CYSTOSCOPY WITH URETHRAL DILATATION N/A 08/21/2021   Procedure: CYSTOSCOPY WITH URETHRAL DILATATION OPTILUME;  Surgeon: Crist Fat, MD;  Location: [facility];  Service: Urology;  Laterality: N/A;   IR FLUORO GUIDE CV LINE RIGHT  05/19/2023   IR REMOVAL TUN CV CATH W/O FL  06/03/2023   IR US GUIDE VASC ACCESS RIGHT  05/19/2023   LUMBAR SPINE SURGERY     lumbar laminectomy with hardware stabilization, with ensuing arachnoiditis   RIGHT HEART CATH N/A  05/13/2023   normal R and L heart pressures.  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    Procedure: RIGHT HEART CATH;  Surgeon: Dorthula Nettles, DO;  Location: [facility];  Service: Cardiovascular;  Laterality: N/A;   TRANSTHORACIC ECHOCARDIOGRAM     05/12/23 EF 40-45%, regional WMA, valves good.   URETHRAL STRICTURE DILATATION  1996 and 2004.   Laser surgery.    Outpatient Medications Prior to Visit  Medication Sig Dispense Refill   Acetaminophen (TYLENOL PO) Take 500 mg by mouth daily as needed (pain).     albuterol (VENTOLIN HFA) 108 (90 Base)  MCG/ACT inhaler INHALE 1-2 PUFFS BY MOUTH EVERY 6 HOURS AS NEEDED FOR WHEEZE OR SHORTNESS OF BREATH 6.7 each 2   amiodarone (PACERONE) 200 MG tablet Take 1 tablet (200 mg total) by mouth daily. 30 tablet 0   apixaban (ELIQUIS) 5 MG TABS tablet Take 1 tablet (5 mg total) by mouth 2 (two) times daily. 60 tablet 0   B-D ULTRAFINE III SHORT PEN 31G X 8 MM MISC USE AS DIRECTED TWICE DAILY FOR MEDICATION INJECTION 100 each 1   blood glucose meter kit and supplies KIT Dispense based on patient and insurance preference. Use to check glucose 4 times daily. 1 each 0   Continuous Glucose Receiver (FREESTYLE LIBRE 3 READER) DEVI Use to check glucose continuously 2 each 3   Continuous Glucose Sensor (FREESTYLE LIBRE 3 PLUS SENSOR) MISC Change sensor every 15 days. 2 each    dapagliflozin propanediol (FARXIGA) 10 MG TABS tablet Take 1 tablet (10 mg total) by mouth daily before breakfast. 30 tablet 11   esomeprazole (NEXIUM) 40 MG capsule Take 1 capsule (40 mg total) by mouth daily. 90 capsule 1   Evolocumab (REPATHA SURECLICK) 140 MG/ML SOAJ INJECT 1 DOSE INTO THE SKIN EVERY 14 (FOURTEEN) DAYS. 6 mL 3   FREESTYLE TEST STRIPS test strip USE TO CHECK GLUCOSE 4 TIMES DAILY. 200 strip 4   insulin aspart (NOVOLOG FLEXPEN) 100 UNIT/ML FlexPen USE 5 UNITS SUBCUTANEOUSLY WITH BF, LUNCH, AND SUPPER 15 mL 0   insulin degludec (TRESIBA FLEXTOUCH) 100 UNIT/ML FlexTouch Pen Inject 20 Units into the skin 2 (two) times daily. 15 U SQ bid     metolazone (ZAROXOLYN) 2.5 MG tablet Take 1 tablet (2.5 mg total) by mouth as directed. Only take after you have spoken to the Heart Failure Clinic 15 tablet 2   metoprolol tartrate (LOPRESSOR) 50 MG tablet TAKE 1 TABLET BY MOUTH TWICE A DAY. 60 tablet 0   morphine (MS CONTIN) 30 MG 12 hr tablet Take 30-60 mg by mouth See admin instructions. Taking 60 mg in the AM and 30 mg in the afternoon and 60 mg at bedtime     Multiple Vitamin (MULTIVITAMIN) tablet Take 1 tablet by mouth daily.      nitroGLYCERIN (NITROSTAT) 0.4 MG SL tablet PLACE 1 TABLET UNDER THE TONGUE EVERY 5 MINUTES AS NEEDED. 25 tablet 1   torsemide (DEMADEX) 20 MG tablet Take 2 tablets (40 mg total) by mouth 2 (two) times daily. 180 tablet 3   methocarbamol (ROBAXIN) 500 MG tablet Take 500 mg by mouth 2 (two) times daily as needed for muscle spasms. (Patient not taking: Reported on 06/23/2023)     No facility-administered medications prior to visit.    Allergies  Allergen Reactions   Ace Inhibitors     Kidney failure   Atorvastatin Other (See Comments)  Arm pain   Prednisone     REACTION: Anxiety   Septra [Sulfamethoxazole-Trimethoprim]    Metformin And Related Diarrhea    Review of Systems As per HPI  PE:    06/23/2023    2:45 PM 06/16/2023    2:56 PM 06/09/2023   10:11 AM  Vitals with BMI  Weight 294 lbs 10 oz 294 lbs 3 oz 312 lbs 13 oz  BMI  38.82 41.28  Systolic 106 130 782  Diastolic 50 60 60  Pulse 89 82 68     Physical Exam  Gen: Alert, well appearing.  Patient is oriented to person, place, time, and situation. CV: RRR, no m/r/g.   LUNGS: CTA bilat, nonlabored resps, good aeration in all lung fields. EXT: 1+ bilat LE pitting edema SKIN: diffuse pinkish maculopapular rash with a few excoriated/scabbed papules.  This is on his lower back, abdomen, arms, and legs.  No hives, vesicles, pustules, or petechiae.  LABS:  Last CBC Lab Results  Component Value Date   WBC 11.0 (H) 06/01/2023   HGB 9.8 (L) 06/01/2023   HCT 31.4 (L) 06/01/2023   MCV 88.9 06/01/2023   MCH 27.3 05/28/2023   RDW 19.8 (H) 06/01/2023   PLT 389.0 06/01/2023   Last metabolic panel Lab Results  Component Value Date   GLUCOSE 418 (H) 06/16/2023   NA 137 06/16/2023   K 3.7 06/16/2023   CL 97 (L) 06/16/2023   CO2 29 06/16/2023   BUN 43 (H) 06/16/2023   CREATININE 1.59 (H) 06/16/2023   GFRNONAA 50 (L) 06/16/2023   CALCIUM 8.4 (L) 06/16/2023   PHOS 4.2 05/29/2023   PROT 7.6 06/01/2023   ALBUMIN 3.3 (L)  06/01/2023   BILITOT 1.0 06/01/2023   ALKPHOS 65 06/01/2023   AST 19 06/01/2023   ALT 19 06/01/2023   ANIONGAP 11 06/16/2023   Last lipids Lab Results  Component Value Date   CHOL 120 05/07/2023   HDL 21 (L) 05/07/2023   LDLCALC 84 05/07/2023   LDLDIRECT 166.2 11/09/2013   TRIG 200 (H) 05/12/2023   CHOLHDL 5.7 05/07/2023   Last hemoglobin A1c Lab Results  Component Value Date   HGBA1C 10.2 (H) 05/05/2023   Last thyroid functions Lab Results  Component Value Date   TSH 1.853 05/05/2023   T3TOTAL 127 10/18/2019   Lab Results  Component Value Date   VITAMINB12 444 10/18/2019   IMPRESSION AND PLAN:  #1 diabetes, poor control, with complications. He is on insulin long-term. Most recently got back on Tresiba at 20 units twice a day and NovoLog 5 units with each meal.  Unfortunately he does not have log of his glucoses but reports they are good. I asked him to bring data and at next follow-up in 2 weeks. Next A1c due around 08/2023.  #2 CHF.  Suspect stress cardiomyopathy in the setting of sepsis. CHF clinic managing. Volume status has improved significantly.  Weight is stable around 294 pounds. Continue torsemide 40 mg twice a day. He is off Lopressor. He has metolazone 2.5 mg tabs to use as needed.  Marcelline Deist was prescribed but he is unable to afford this. CHF clinic following him up in about 2 months and we will repeat echo at that time. Monitor basic metabolic panel today.  3.  New A-fib/a flutter. Maintaining sinus rhythm status post DCCV in the hospital.  Continue amiodarone 200 mg a day and Eliquis 5 mg twice a day.  4.  Acute kidney injury requiring short-term hemodialysis. This is  recovering.  He has initial appointment set up with a nephrologist. Monitor renal function today.  5.  Debilitated/physical deconditioning. Gradually improving with home health PT and OT.  #6 normocytic anemia. Anemia of chronic disease.  Baseline hemoglobin was around 11 prior to  hospitalization.  This dropped to 9.2 in the hospital. Monitor CBC today.  7.  Rash.  Unknown etiology.  Question drug reaction from something he was given in the hospital.  This is gradually improving/resolving. Prescribed Zyrtec 10 mg daily as needed itching.  An After Visit Summary was printed and given to the patient.  FOLLOW UP: 2 wks  Signed:  Santiago Bumpers, MD           06/23/2023

## 2023-06-24 ENCOUNTER — Encounter: Payer: Self-pay | Admitting: Family Medicine

## 2023-06-24 LAB — BASIC METABOLIC PANEL
BUN: 29 mg/dL — ABNORMAL HIGH (ref 6–23)
CO2: 29 meq/L (ref 19–32)
Calcium: 9.2 mg/dL (ref 8.4–10.5)
Chloride: 95 meq/L — ABNORMAL LOW (ref 96–112)
Creatinine, Ser: 1.44 mg/dL (ref 0.40–1.50)
GFR: 54.07 mL/min — ABNORMAL LOW (ref >60.00)
Glucose, Bld: 363 mg/dL — ABNORMAL HIGH (ref 70–99)
Potassium: 3.8 meq/L (ref 3.5–5.1)
Sodium: 136 meq/L (ref 135–145)

## 2023-06-24 LAB — CBC
HCT: 39.1 % (ref 39.0–52.0)
Hemoglobin: 12.7 g/dL — ABNORMAL LOW (ref 13.0–17.0)
MCHC: 32.5 g/dL (ref 30.0–36.0)
MCV: 88.7 fl (ref 78.0–100.0)
Platelets: 269 10*3/uL (ref 150.0–400.0)
RBC: 4.41 Mil/uL (ref 4.22–5.81)
RDW: 18.5 % — ABNORMAL HIGH (ref 11.5–15.5)
WBC: 10.1 10*3/uL (ref 4.0–10.5)

## 2023-06-26 DIAGNOSIS — S80912D Unspecified superficial injury of left knee, subsequent encounter: Secondary | ICD-10-CM | POA: Diagnosis not present

## 2023-06-26 DIAGNOSIS — S81801A Unspecified open wound, right lower leg, initial encounter: Secondary | ICD-10-CM | POA: Diagnosis not present

## 2023-06-27 ENCOUNTER — Ambulatory Visit (HOSPITAL_COMMUNITY)
Admission: RE | Admit: 2023-06-27 | Discharge: 2023-06-27 | Disposition: A | Discharge status: Home / Self Care | Source: Ambulatory Visit | Attending: Cardiology | Admitting: Cardiology

## 2023-06-27 DIAGNOSIS — I5042 Chronic combined systolic (congestive) and diastolic (congestive) heart failure: Secondary | ICD-10-CM | POA: Diagnosis not present

## 2023-06-27 LAB — BASIC METABOLIC PANEL
Anion gap: 9 (ref 5–15)
BUN: 30 mg/dL — ABNORMAL HIGH (ref 6–20)
CO2: 25 mmol/L (ref 22–32)
Calcium: 8.9 mg/dL (ref 8.9–10.3)
Chloride: 102 mmol/L (ref 98–111)
Creatinine, Ser: 1.33 mg/dL — ABNORMAL HIGH (ref 0.61–1.24)
GFR, Estimated: >60 mL/min (ref >60)
Glucose, Bld: 212 mg/dL — ABNORMAL HIGH (ref 70–99)
Potassium: 3.7 mmol/L (ref 3.5–5.1)
Sodium: 136 mmol/L (ref 135–145)

## 2023-06-29 ENCOUNTER — Other Ambulatory Visit (HOSPITAL_COMMUNITY): Payer: Self-pay

## 2023-06-29 ENCOUNTER — Telehealth: Payer: Self-pay

## 2023-06-29 DIAGNOSIS — J15211 Pneumonia due to Methicillin susceptible Staphylococcus aureus: Secondary | ICD-10-CM | POA: Diagnosis not present

## 2023-06-29 DIAGNOSIS — E1165 Type 2 diabetes mellitus with hyperglycemia: Secondary | ICD-10-CM | POA: Diagnosis not present

## 2023-06-29 DIAGNOSIS — I083 Combined rheumatic disorders of mitral, aortic and tricuspid valves: Secondary | ICD-10-CM | POA: Diagnosis not present

## 2023-06-29 DIAGNOSIS — I2089 Other forms of angina pectoris: Secondary | ICD-10-CM | POA: Diagnosis not present

## 2023-06-29 DIAGNOSIS — G4733 Obstructive sleep apnea (adult) (pediatric): Secondary | ICD-10-CM | POA: Diagnosis not present

## 2023-06-29 DIAGNOSIS — N390 Urinary tract infection, site not specified: Secondary | ICD-10-CM | POA: Diagnosis not present

## 2023-06-29 DIAGNOSIS — I4891 Unspecified atrial fibrillation: Secondary | ICD-10-CM | POA: Diagnosis not present

## 2023-06-29 DIAGNOSIS — E7800 Pure hypercholesterolemia, unspecified: Secondary | ICD-10-CM | POA: Diagnosis not present

## 2023-06-29 DIAGNOSIS — I700 Atherosclerosis of aorta: Secondary | ICD-10-CM | POA: Diagnosis not present

## 2023-06-29 DIAGNOSIS — I110 Hypertensive heart disease with heart failure: Secondary | ICD-10-CM | POA: Diagnosis not present

## 2023-06-29 DIAGNOSIS — I5041 Acute combined systolic (congestive) and diastolic (congestive) heart failure: Secondary | ICD-10-CM | POA: Diagnosis not present

## 2023-06-29 DIAGNOSIS — E1142 Type 2 diabetes mellitus with diabetic polyneuropathy: Secondary | ICD-10-CM | POA: Diagnosis not present

## 2023-06-29 NOTE — Telephone Encounter (Signed)
 Home health orders received 06/29/23 for [facility]  SN 1wk1, 2wk2, 1wk5 PT effective 06/12/23 2WK4, 1WK4  Home health initiation orders: Yes.  Home health re-certification orders: No. Patient last seen by ordering physician for this condition: 06/23/23. Must be less than 90 days for re-certification and less than 30 days prior for initiation. Visit must have been for the condition the orders are being placed.  Patient meets criteria for Physician to sign orders: Yes.        Current med list has been attached: Yes        Orders placed on physicians desk for signature: 06/29/23 (date) If patient does not meet criteria for orders to be signed: pt was called to schedule appt. Appt is scheduled for 07/07/23.   Eulan Heyward D Dolan Xia   Placed on PCP desk to review and sign, if appropriate.

## 2023-07-04 DIAGNOSIS — S81801A Unspecified open wound, right lower leg, initial encounter: Secondary | ICD-10-CM | POA: Diagnosis not present

## 2023-07-04 DIAGNOSIS — S81802A Unspecified open wound, left lower leg, initial encounter: Secondary | ICD-10-CM | POA: Diagnosis not present

## 2023-07-05 ENCOUNTER — Other Ambulatory Visit: Payer: Self-pay | Admitting: Family Medicine

## 2023-07-05 DIAGNOSIS — E1122 Type 2 diabetes mellitus with diabetic chronic kidney disease: Secondary | ICD-10-CM | POA: Diagnosis not present

## 2023-07-05 DIAGNOSIS — N1831 Chronic kidney disease, stage 3a: Secondary | ICD-10-CM | POA: Diagnosis not present

## 2023-07-05 DIAGNOSIS — I429 Cardiomyopathy, unspecified: Secondary | ICD-10-CM | POA: Diagnosis not present

## 2023-07-05 DIAGNOSIS — E785 Hyperlipidemia, unspecified: Secondary | ICD-10-CM | POA: Diagnosis not present

## 2023-07-05 DIAGNOSIS — N179 Acute kidney failure, unspecified: Secondary | ICD-10-CM | POA: Diagnosis not present

## 2023-07-06 ENCOUNTER — Other Ambulatory Visit: Payer: Self-pay | Admitting: Family Medicine

## 2023-07-06 ENCOUNTER — Other Ambulatory Visit: Payer: Self-pay

## 2023-07-06 MED ORDER — NOVOLOG FLEXPEN 100 UNIT/ML ~~LOC~~ SOPN: PEN_INJECTOR | SUBCUTANEOUS | Status: DC

## 2023-07-06 MED ORDER — TRESIBA FLEXTOUCH 100 UNIT/ML ~~LOC~~ SOPN: 22.0000 [IU] | PEN_INJECTOR | Freq: Two times a day (BID) | SUBCUTANEOUS | Status: DC

## 2023-07-07 ENCOUNTER — Ambulatory Visit (INDEPENDENT_AMBULATORY_CARE_PROVIDER_SITE_OTHER): Admitting: Family Medicine

## 2023-07-07 ENCOUNTER — Encounter: Payer: Self-pay | Admitting: Family Medicine

## 2023-07-07 VITALS — BP 126/75 | HR 59 | Temp 98.4°F | Ht 73.0 in | Wt 293.8 lb

## 2023-07-07 DIAGNOSIS — N179 Acute kidney failure, unspecified: Secondary | ICD-10-CM

## 2023-07-07 DIAGNOSIS — Z794 Long term (current) use of insulin: Secondary | ICD-10-CM

## 2023-07-07 DIAGNOSIS — I10 Essential (primary) hypertension: Secondary | ICD-10-CM

## 2023-07-07 DIAGNOSIS — E118 Type 2 diabetes mellitus with unspecified complications: Secondary | ICD-10-CM

## 2023-07-07 NOTE — Progress Notes (Signed)
 OFFICE VISIT  07/07/2023  CC:  Chief Complaint  Patient presents with   Medical Management of Chronic Issues    2 week f/u DM; pt does not have written sugar readings, only via phone, current blood sugar reading 145    Patient is a 58 y.o. male who presents for 2-week follow-up diabetes. A/P as of last visit: "1 diabetes, poor control, with complications. He is on insulin long-term. Most recently got back on Tresiba at 20 units twice a day and NovoLog 5 units with each meal.  Unfortunately he does not have log of his glucoses but reports they are good. I asked him to bring data and at next follow-up in 2 weeks. Next A1c due around 08/2023.   #2 CHF.  Suspect stress cardiomyopathy in the setting of sepsis. CHF clinic managing. Volume status has improved significantly.  Weight is stable around 294 pounds. Continue torsemide 40 mg twice a day. He is off Lopressor. He has metolazone 2.5 mg tabs to use as needed.  Marcelline Deist was prescribed but he is unable to afford this. CHF clinic following him up in about 2 months and we will repeat echo at that time. Monitor basic metabolic panel today.   3.  New A-fib/a flutter. Maintaining sinus rhythm status post DCCV in the hospital.  Continue amiodarone 200 mg a day and Eliquis 5 mg twice a day.   4.  Acute kidney injury requiring short-term hemodialysis. This is recovering.  He has initial appointment set up with a nephrologist. Monitor renal function today.   5.  Debilitated/physical deconditioning. Gradually improving with home health PT and OT.   #6 normocytic anemia. Anemia of chronic disease.  Baseline hemoglobin was around 11 prior to hospitalization.  This dropped to 9.2 in the hospital. Monitor CBC today.   7.  Rash.  Unknown etiology.  Question drug reaction from something he was given in the hospital.  This is gradually improving/resolving. Prescribed Zyrtec 10 mg daily as needed itching."  INTERIM HX: Romen continues to feel  better and better each day. Home glucoses often go up in the afternoon.  He often forgets his midday dose of NovoLog.  He has a tendency to have low sugars in the very early morning hours. Currently he is giving 36 units of Tresiba twice a day and 10 units of NovoLog with meals.  He does not feel like he is retaining any fluid. His rash is gradually improving. He recently saw the nephrologist, was reassured that things are improving and has follow-up in 2 months.  Review of systems: No palpitations, no dizziness, no headaches, no shortness of breath. No nausea or vomiting.   Past Medical History:  Diagnosis Date   B12 deficiency    Chronic low back pain    Dr. Vear Clock is pain mgmt MD   Depression    Diabetes mellitus with complication (HCC)    DPN + microalbuminuria   GERD (gastroesophageal reflux disease)    History of acute renal failure    Required hemodialysis in hospital for cardiogenic shock 05/2023   History of adenomatous polyp of colon 05/22/2021   recall 7 yrs   Hypercholesterolemia    Statin intolerant.  Started Repatha 10/2021   Hypertension    Nonischemic cardiomyopathy (HCC)    Acute heart failure due to sepsis/shock.  EF 40 to 45% 05/2023.   OSA (obstructive sleep apnea)    on nighttime home O2 (refuses to wear CPAP because of claustrophobia)   PAF (paroxysmal atrial fibrillation) (  HCC)    Initial diagnosis during hospitalization for shock 05/2023.  DCCV successful in hospital.  Discharged home on amiodarone and Eliquis.   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 + chronic lumbar radiculopathy   Persistent asthma    Never smoker   Seizures (HCC)    Statin intolerance    myalgias   Subclinical hypothyroidism 06/2017    Past Surgical History:  Procedure Laterality Date   COLONOSCOPY  05/22/2021   Adenomas--recall 7 years   CYSTOSCOPY WITH RETROGRADE URETHROGRAM N/A 08/21/2021   Procedure: RETROGRADE URETHROGRAM;  Surgeon: Crist Fat, MD;  Location: [facility];  Service: Urology;  Laterality: N/A;   CYSTOSCOPY WITH URETHRAL DILATATION N/A 08/21/2021   Procedure: CYSTOSCOPY WITH URETHRAL DILATATION OPTILUME;  Surgeon: Crist Fat, MD;  Location: [facility];  Service: Urology;  Laterality: N/A;   IR FLUORO GUIDE CV LINE RIGHT  05/19/2023   IR REMOVAL TUN CV CATH W/O FL  06/03/2023   IR US GUIDE VASC ACCESS RIGHT  05/19/2023   LUMBAR SPINE SURGERY     lumbar laminectomy with hardware stabilization, with ensuing arachnoiditis   RIGHT HEART CATH N/A 05/13/2023   normal R and L heart pressures.  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    Procedure: RIGHT HEART CATH;  Surgeon: Dorthula Nettles, DO;  Location: [facility];  Service: Cardiovascular;  Laterality: N/A;   TRANSTHORACIC ECHOCARDIOGRAM     05/12/23 EF 40-45%, regional WMA, valves good.   URETHRAL STRICTURE DILATATION  1996 and 2004.   Laser surgery.    Outpatient Medications Prior to Visit  Medication Sig Dispense Refill   Acetaminophen (TYLENOL PO) Take 500 mg by mouth daily as needed (pain).     albuterol (VENTOLIN HFA) 108 (90 Base) MCG/ACT inhaler INHALE 1-2 PUFFS BY MOUTH EVERY 6 HOURS AS NEEDED FOR WHEEZE OR SHORTNESS OF BREATH 6.7 each 2   amiodarone (PACERONE) 200 MG tablet TAKE 1 TABLET BY MOUTH EVERY DAY 30 tablet 0   B-D ULTRAFINE III SHORT PEN 31G X 8 MM MISC USE AS DIRECTED TWICE DAILY FOR MEDICATION INJECTION 100 each 1   blood glucose meter kit and supplies KIT Dispense based on patient and insurance preference. Use to check glucose 4 times daily. 1 each 0   cetirizine (ZYRTEC) 10 MG tablet 1 tab po daily as needed for itching 30 tablet 0   Continuous Glucose Receiver (FREESTYLE LIBRE 3 READER) DEVI Use to check glucose continuously 2 each 3   Continuous Glucose Sensor (FREESTYLE LIBRE 3 PLUS SENSOR) MISC Change sensor every 15 days. 2 each    dapagliflozin propanediol (FARXIGA) 10 MG TABS  tablet Take 1 tablet (10 mg total) by mouth daily before breakfast. 30 tablet 11   ELIQUIS 5 MG TABS tablet TAKE 1 TABLET BY MOUTH TWICE A DAY 60 tablet 0   esomeprazole (NEXIUM) 40 MG capsule Take 1 capsule (40 mg total) by mouth daily. 90 capsule 1   Evolocumab (REPATHA SURECLICK) 140 MG/ML SOAJ INJECT 1 DOSE INTO THE SKIN EVERY 14 (FOURTEEN) DAYS. 6 mL 3   FREESTYLE TEST STRIPS test strip USE TO CHECK GLUCOSE 4 TIMES DAILY. 200 strip 4   insulin aspart (NOVOLOG FLEXPEN) 100 UNIT/ML FlexPen USE 8 UNITS SUBCUTANEOUSLY WITH BF, LUNCH, AND SUPPER (Patient taking differently: USE 10 UNITS SUBCUTANEOUSLY WITH BF, LUNCH, AND SUPPER)     insulin degludec (TRESIBA FLEXTOUCH) 100 UNIT/ML FlexTouch Pen Inject  22 Units into the skin 2 (two) times daily. 15 U SQ bid (Patient taking differently: Inject 36 Units into the skin 2 (two) times daily. 15 U SQ bid)     metolazone (ZAROXOLYN) 2.5 MG tablet Take 1 tablet (2.5 mg total) by mouth as directed. Only take after you have spoken to the Heart Failure Clinic 15 tablet 2   morphine (MS CONTIN) 30 MG 12 hr tablet Take 30-60 mg by mouth See admin instructions. Taking 60 mg in the AM and 30 mg in the afternoon and 60 mg at bedtime     Multiple Vitamin (MULTIVITAMIN) tablet Take 1 tablet by mouth daily.     torsemide (DEMADEX) 20 MG tablet Take 2 tablets (40 mg total) by mouth 2 (two) times daily. 180 tablet 3   methocarbamol (ROBAXIN) 500 MG tablet Take 500 mg by mouth 2 (two) times daily as needed for muscle spasms. (Patient not taking: Reported on 07/07/2023)     nitroGLYCERIN (NITROSTAT) 0.4 MG SL tablet PLACE 1 TABLET UNDER THE TONGUE EVERY 5 MINUTES AS NEEDED. (Patient not taking: Reported on 07/07/2023) 25 tablet 1   No facility-administered medications prior to visit.    Allergies  Allergen Reactions   Ace Inhibitors     Kidney failure   Atorvastatin Other (See Comments)    Arm pain   Prednisone     REACTION: Anxiety   Septra  [Sulfamethoxazole-Trimethoprim]    Metformin And Related Diarrhea    Review of Systems As per HPI  PE:    07/07/2023    2:40 PM 06/23/2023    2:45 PM 06/16/2023    2:56 PM  Vitals with BMI  Height 6\' 1"     Weight 293 lbs 13 oz 294 lbs 10 oz 294 lbs 3 oz  BMI 38.77  38.82  Systolic 126 106 409  Diastolic 75 50 60  Pulse 59 89 82     Physical Exam  Gen: Alert, well appearing.  Patient is oriented to person, place, time, and situation. AFFECT: pleasant, lucid thought and speech. Lower legs with trace bilateral pitting edema.  He has pinkish flaky/superficial dry desquamation of both pretibial surfaces. He has some scabbed papules on both arms.  His back is cleared up now.   LABS:  Last CBC Lab Results  Component Value Date   WBC 10.1 06/23/2023   HGB 12.7 (L) 06/23/2023   HCT 39.1 06/23/2023   MCV 88.7 06/23/2023   MCH 27.3 05/28/2023   RDW 18.5 (H) 06/23/2023   PLT 269.0 06/23/2023   Last metabolic panel Lab Results  Component Value Date   GLUCOSE 212 (H) 06/27/2023   NA 136 06/27/2023   K 3.7 06/27/2023   CL 102 06/27/2023   CO2 25 06/27/2023   BUN 30 (H) 06/27/2023   CREATININE 1.33 (H) 06/27/2023   GFRNONAA >60 06/27/2023   CALCIUM 8.9 06/27/2023   PHOS 4.2 05/29/2023   PROT 7.6 06/01/2023   ALBUMIN 3.3 (L) 06/01/2023   BILITOT 1.0 06/01/2023   ALKPHOS 65 06/01/2023   AST 19 06/01/2023   ALT 19 06/01/2023   ANIONGAP 9 06/27/2023   Last lipids Lab Results  Component Value Date   CHOL 120 05/07/2023   HDL 21 (L) 05/07/2023   LDLCALC 84 05/07/2023   LDLDIRECT 166.2 11/09/2013   TRIG 200 (H) 05/12/2023   CHOLHDL 5.7 05/07/2023   Last hemoglobin A1c Lab Results  Component Value Date   HGBA1C 10.2 (H) 05/05/2023   Last thyroid functions Lab  Results  Component Value Date   TSH 1.853 05/05/2023   T3TOTAL 127 10/18/2019   Last vitamin D Lab Results  Component Value Date   VD25OH 77 06/22/2011   Last vitamin B12 and Folate Lab Results   Component Value Date   VITAMINB12 444 10/18/2019   FOLATE >20.0 ng/mL 11/05/2009   IMPRESSION AND PLAN:  #1 diabetes with complication, historically poor control. Improving. Continue Tresiba 36 units twice a day.  He will continue 10 units of NovoLog with breakfast and lunch but decrease his supper NovoLog to 5 units. Next A1c in 1 mo.  #2 hypertension, normal blood pressure without antihypertensive lately.  #3 Acute kidney injury requiring short-term hemodialysis. Renal function has gradually improved, most recent serum creatinine was 1.33 about 1 week ago.  #4 CHF.  Suspect stress cardiomyopathy in the setting of sepsis. CHF clinic managing. Volume status has improved significantly.  Weight is stable around 294 pounds. Continue torsemide 40 mg twice a day. He is off Lopressor. He has metolazone 2.5 mg tabs to use as needed.  Marcelline Deist was prescribed but he is unable to afford this. Cardiology has plans for follow-up echo in about 6 weeks.  An After Visit Summary was printed and given to the patient.  FOLLOW UP: Return in about 4 weeks (around 08/04/2023) for routine chronic illness f/u.  Signed:  Santiago Bumpers, MD           07/07/2023

## 2023-07-10 DIAGNOSIS — E1142 Type 2 diabetes mellitus with diabetic polyneuropathy: Secondary | ICD-10-CM | POA: Diagnosis not present

## 2023-07-16 ENCOUNTER — Other Ambulatory Visit: Payer: Self-pay | Admitting: Family Medicine

## 2023-07-16 DIAGNOSIS — E118 Type 2 diabetes mellitus with unspecified complications: Secondary | ICD-10-CM

## 2023-07-27 ENCOUNTER — Other Ambulatory Visit: Payer: Self-pay | Admitting: Family Medicine

## 2023-07-27 DIAGNOSIS — Z794 Long term (current) use of insulin: Secondary | ICD-10-CM

## 2023-07-28 ENCOUNTER — Telehealth (INDEPENDENT_AMBULATORY_CARE_PROVIDER_SITE_OTHER): Payer: Self-pay | Admitting: Primary Care

## 2023-07-28 ENCOUNTER — Other Ambulatory Visit: Payer: Self-pay | Admitting: Family Medicine

## 2023-07-28 ENCOUNTER — Encounter: Payer: Self-pay | Admitting: Primary Care

## 2023-07-28 ENCOUNTER — Ambulatory Visit (INDEPENDENT_AMBULATORY_CARE_PROVIDER_SITE_OTHER): Admitting: Primary Care

## 2023-07-28 VITALS — BP 114/62 | HR 101 | Temp 98.2°F

## 2023-07-28 DIAGNOSIS — G4733 Obstructive sleep apnea (adult) (pediatric): Secondary | ICD-10-CM | POA: Diagnosis not present

## 2023-07-28 DIAGNOSIS — G4730 Sleep apnea, unspecified: Secondary | ICD-10-CM

## 2023-07-28 DIAGNOSIS — F17220 Nicotine dependence, chewing tobacco, uncomplicated: Secondary | ICD-10-CM

## 2023-07-28 DIAGNOSIS — I509 Heart failure, unspecified: Secondary | ICD-10-CM

## 2023-07-28 DIAGNOSIS — N179 Acute kidney failure, unspecified: Secondary | ICD-10-CM

## 2023-07-28 MED ORDER — TRESIBA FLEXTOUCH 100 UNIT/ML ~~LOC~~ SOPN: 22.0000 [IU] | PEN_INJECTOR | Freq: Two times a day (BID) | SUBCUTANEOUS | Status: DC

## 2023-07-28 NOTE — Patient Instructions (Signed)
-  SLEEP APNEA: Sleep apnea is a condition where you stop breathing for short periods during sleep. You have difficulty tolerating the BiPAP mask and are interested in the [place] device. We will schedule a sleep study at [facility] in May to evaluate your condition further. Depending on the results, we may try BiPAP or CPAP again, and if those are not suitable, we will discuss the [facility] device surgery.  -CONGESTIVE HEART FAILURE: Congestive heart failure is when your heart doesn't pump blood as well as it should. You were previously hospitalized and required intubation and BiPAP. Currently, you are asymptomatic and engaged in physical therapy, which has shown improvement.  -DIURETIC USE FOR FLUID MANAGEMENT: Diuretics help your body get rid of excess fluid. You are taking torsemide  but experience frequent urination and nighttime awakenings. To reduce nighttime urination, you should take your second dose of the diuretic at 5 PM.  INSTRUCTIONS: Please follow up on the sleep study results in May so we can adjust your treatment plan accordingly.  Follow-up: Please schedule a visit with [facility] NP in mid-late June

## 2023-07-28 NOTE — Telephone Encounter (Signed)
 Do I need to re-order? Can we figure out if it has been approved

## 2023-07-28 NOTE — Telephone Encounter (Signed)
 Copied from CRM [id number]. Topic: Clinical - Medication Refill >> Jul 28, 2023 11:59 AM Bambi Bonine D wrote: Most Recent Primary Care Visit:  Provider: Shelvia Dick  Department: [facility]  Visit Type: OFFICE VISIT  Date: 07/07/2023  Medication: insulin  degludec (TRESIBA  FLEXTOUCH) 100 UNIT/ML FlexTouch Pen  Has the patient contacted their pharmacy? Yes (Agent: If no, request that the patient contact the pharmacy for the refill. If patient does not wish to contact the pharmacy document the reason why and proceed with request.) (Agent: If yes, when and what did the pharmacy advise?)  Is this the correct pharmacy for this prescription? Yes If no, delete pharmacy and type the correct one.  This is the patient's preferred pharmacy:  [id number] - [place], [place] - [address] [place] [address] Phone: [phone] Fax: [phone]  Has the prescription been filled recently? No  Is the patient out of the medication? Yes  Has the patient been seen for an appointment in the last year OR does the patient have an upcoming appointment? Yes  Can we respond through MyChart? Yes  Agent: Please be advised that Rx refills may take up to 3 business days. We ask that you follow-up with your pharmacy.

## 2023-07-28 NOTE — Telephone Encounter (Deleted)
 Patient was ordered for split night study by another provider, has this been scheduled?

## 2023-07-28 NOTE — Telephone Encounter (Addendum)
 Hi Beth,  I spoke with Chantel at Dr.?Diaz's office. Because they ordered the split-night study, she's contacting their precert team to get an update on the authorization that's still pending. She'll update me on my direct line as soon as she has more information.

## 2023-07-28 NOTE — Telephone Encounter (Signed)
 Thank you :)

## 2023-07-28 NOTE — Progress Notes (Signed)
 @Patient  ID: Dylan Owen, male    DOB: 1966-03-04, 58 y.o.   MRN: [id number]  Chief Complaint  Patient presents with   Consult    Sleep Consult-in hospital ?snoring    Referring provider: Lee, [place], NP  HPI: 58 year old male, never smoked. PMH significant for CHF, afib, HTN, acute respiratory failure, allergic rhinitis, multifocal pneumonia, GERD, type 2 diabetes, hyperlipidemia, tobacco.   07/28/2023 Discussed the use of AI scribe software for clinical note transcription with the patient, who gave verbal consent to proceed.  History of Present Illness   Dylan Owen is a 58 year old male with congestive heart failure who presents for a pulmonary consultation following a recent hospitalization.   He was hospitalized from January 29 to February 23 for congestive heart failure, during which he experienced acute kidney injury and required intubation. He was weaned to BiPAP and oxygen before being discharged on room air.  He has a history of sleep apnea and has undergone three sleep studies in the past, though the details are not in the current chart. He experiences frequent awakenings at night due to diuretic use, taking two tablets in the morning and two at night, which causes him to wake up hourly to urinate. He has reduced his nighttime dose to prevent accidents during sleep.  No current respiratory symptoms such as shortness of breath, wheezing, or productive cough, though he occasionally experiences wheezing. He has never smoked but has chewed tobacco since age 2. He has a history of waking up gasping for air, which was more frequent when he was on oxygen.  His sleep routine includes going to bed around 9 or 10 PM, taking up to two hours to fall asleep, and waking up around 9 to 11 AM. He sometimes naps in a recliner for 30 minutes to an hour, especially after taking morphine  in the morning. He experiences moderate chances of dozing off when sitting still or watching TV,  but not in public places or while talking to someone.      Allergies  Allergen Reactions   Ace Inhibitors     Kidney failure   Atorvastatin  Other (See Comments)    Arm pain   Prednisone     REACTION: Anxiety   Septra  [Sulfamethoxazole -Trimethoprim ]    Metformin  And Related Diarrhea    Immunization History  Administered Date(s) Administered   Influenza Inj Mdck Quad Pf 01/02/2018   Influenza Split 02/08/2012   Influenza Whole 02/08/2008, 01/02/2009, 01/28/2010   Influenza,inj,Quad PF,6+ Mos 12/19/2013, 03/04/2016, 06/07/2017   Influenza-Unspecified 03/07/2013, 02/03/2018, 03/22/2021   PFIZER(Purple Top)SARS-COV-2 Vaccination 08/28/2019, 09/14/2019   PNEUMOCOCCAL CONJUGATE-20 09/12/2020   Pneumococcal Polysaccharide-23 09/27/2008, 03/04/2016   Tdap 12/19/2013   Zoster Recombinant(Shingrix) 09/12/2020, 12/25/2020    Past Medical History:  Diagnosis Date   B12 deficiency    Chronic low back pain    Dr. Valda Garnet is pain mgmt MD   Depression    Diabetes mellitus with complication (HCC)    DPN + microalbuminuria   GERD (gastroesophageal reflux disease)    History of acute renal failure    Required hemodialysis in hospital for cardiogenic shock 05/2023   History of adenomatous polyp of colon 05/22/2021   recall 7 yrs   Hypercholesterolemia    Statin intolerant.  Started Repatha  10/2021   Hypertension    Nonischemic cardiomyopathy (HCC)    Acute heart failure due to sepsis/shock.  EF 40 to 45% 05/2023.   OSA (obstructive sleep apnea)    on  nighttime home O2 (refuses to wear CPAP because of claustrophobia)   PAF (paroxysmal atrial fibrillation) (HCC)    Initial diagnosis during hospitalization for shock 05/2023.  DCCV successful in hospital.  Discharged home on amiodarone  and Eliquis .   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 + chronic lumbar radiculopathy   Persistent asthma    Never smoker   Seizures (HCC)    Statin intolerance    myalgias   Subclinical hypothyroidism  06/2017    Tobacco History: Social History   Tobacco Use  Smoking Status Never  Smokeless Tobacco Current   Types: Chew  Tobacco Comments   dip   Ready to quit: Not Answered Counseling given: Not Answered Tobacco comments: dip   Outpatient Medications Prior to Visit  Medication Sig Dispense Refill   Acetaminophen  (TYLENOL  PO) Take 500 mg by mouth daily as needed (pain).     albuterol  (VENTOLIN  HFA) 108 (90 Base) MCG/ACT inhaler INHALE 1-2 PUFFS BY MOUTH EVERY 6 HOURS AS NEEDED FOR WHEEZE OR SHORTNESS OF BREATH 6.7 each 2   amiodarone  (PACERONE ) 200 MG tablet TAKE 1 TABLET BY MOUTH EVERY DAY 30 tablet 0   B-D ULTRAFINE III SHORT PEN 31G X 8 MM MISC USE AS DIRECTED TWICE DAILY FOR MEDICATION INJECTION 100 each 1   blood glucose meter kit and supplies KIT Dispense based on patient and insurance preference. Use to check glucose 4 times daily. 1 each 0   cetirizine  (ZYRTEC ) 10 MG tablet TAKE 1 TABLET BY MOUTH DAILY AS NEEDED FOR ITCHING 90 tablet 1   Continuous Glucose Receiver (FREESTYLE LIBRE 3 READER) DEVI Use to check glucose continuously 2 each 3   Continuous Glucose Sensor (FREESTYLE LIBRE 3 SENSOR) MISC PLACE 1 SENSOR ON THE SKIN EVERY 14 DAYS. USE TO CHECK GLUCOSE CONTINUOUSLY 2 each 3   dapagliflozin  propanediol (FARXIGA ) 10 MG TABS tablet Take 1 tablet (10 mg total) by mouth daily before breakfast. 30 tablet 11   ELIQUIS  5 MG TABS tablet TAKE 1 TABLET BY MOUTH TWICE A DAY 60 tablet 0   esomeprazole  (NEXIUM ) 40 MG capsule Take 1 capsule (40 mg total) by mouth daily. (Patient taking differently: Take 40 mg by mouth 2 (two) times daily before a meal.) 90 capsule 1   Evolocumab  (REPATHA  SURECLICK) 140 MG/ML SOAJ INJECT 1 DOSE INTO THE SKIN EVERY 14 (FOURTEEN) DAYS. 6 mL 3   FREESTYLE TEST STRIPS test strip USE TO CHECK GLUCOSE 4 TIMES DAILY. 200 strip 4   insulin  aspart (NOVOLOG  FLEXPEN) 100 UNIT/ML FlexPen USE 8 UNITS SUBCUTANEOUSLY WITH BF, LUNCH, AND SUPPER (Patient taking  differently: USE 10 UNITS SUBCUTANEOUSLY WITH BF, LUNCH, AND SUPPER)     insulin  degludec (TRESIBA  FLEXTOUCH) 100 UNIT/ML FlexTouch Pen Inject 22 Units into the skin 2 (two) times daily. 15 U SQ bid (Patient taking differently: Inject 36 Units into the skin 2 (two) times daily. 15 U SQ bid)     methocarbamol  (ROBAXIN ) 500 MG tablet Take 500 mg by mouth 2 (two) times daily as needed for muscle spasms.     metolazone  (ZAROXOLYN ) 2.5 MG tablet Take 1 tablet (2.5 mg total) by mouth as directed. Only take after you have spoken to the [facility] Failure Clinic 15 tablet 2   morphine  (MS CONTIN ) 30 MG 12 hr tablet Take 30-60 mg by mouth See admin instructions. Taking 60 mg in the AM and 30 mg in the afternoon and 60 mg at bedtime     Multiple Vitamin (MULTIVITAMIN) tablet Take 1 tablet  by mouth daily.     nitroGLYCERIN  (NITROSTAT ) 0.4 MG SL tablet PLACE 1 TABLET UNDER THE TONGUE EVERY 5 MINUTES AS NEEDED. 25 tablet 1   torsemide  (DEMADEX ) 20 MG tablet Take 2 tablets (40 mg total) by mouth 2 (two) times daily. 180 tablet 3   No facility-administered medications prior to visit.    Review of Systems  Review of Systems  Constitutional:  Positive for fatigue.  HENT: Negative.    Respiratory:  Positive for wheezing. Negative for shortness of breath.   Cardiovascular: Negative.  Negative for leg swelling.  Psychiatric/Behavioral:  Positive for sleep disturbance.    Physical Exam  BP 114/62 (BP Location: Right Arm, Patient Position: Sitting, Cuff Size: Large)   Pulse (!) 101   Temp 98.2 F (36.8 C) (Oral)   SpO2 91%  Physical Exam Constitutional:      General: He is not in acute distress.    Appearance: Normal appearance. He is obese. He is not ill-appearing.  HENT:     Head: Normocephalic and atraumatic.     Mouth/Throat:     Mouth: Mucous membranes are moist.     Pharynx: Oropharynx is clear.  Cardiovascular:     Rate and Rhythm: Normal rate and regular rhythm.  Pulmonary:     Effort: Pulmonary  effort is normal.     Breath sounds: Normal breath sounds.  Musculoskeletal:        General: Normal range of motion.  Skin:    General: Skin is warm and dry.  Neurological:     General: No focal deficit present.     Mental Status: He is alert and oriented to person, place, and time. Mental status is at baseline.  Psychiatric:        Mood and Affect: Mood normal.        Behavior: Behavior normal.        Thought Content: Thought content normal.        Judgment: Judgment normal.      Lab Results:  CBC    Component Value Date/Time   WBC 10.1 06/23/2023 1525   RBC 4.41 06/23/2023 1525   HGB 12.7 (L) 06/23/2023 1525   HCT 39.1 06/23/2023 1525   PLT 269.0 06/23/2023 1525   MCV 88.7 06/23/2023 1525   MCH 27.3 05/28/2023 0311   MCHC 32.5 06/23/2023 1525   RDW 18.5 (H) 06/23/2023 1525   LYMPHSABS 1.6 06/01/2023 1515   MONOABS 1.6 (H) 06/01/2023 1515   EOSABS 1.1 (H) 06/01/2023 1515   BASOSABS 0.2 (H) 06/01/2023 1515    BMET    Component Value Date/Time   NA 136 06/27/2023 1341   K 3.7 06/27/2023 1341   CL 102 06/27/2023 1341   CO2 25 06/27/2023 1341   GLUCOSE 212 (H) 06/27/2023 1341   BUN 30 (H) 06/27/2023 1341   CREATININE 1.33 (H) 06/27/2023 1341   CREATININE 1.07 11/05/2022 1417   CALCIUM  8.9 06/27/2023 1341   GFRNONAA >60 06/27/2023 1341   GFRNONAA >60 09/04/2010 0900   GFRAA >90 10/09/2011 1530   GFRAA >60 09/04/2010 0900    BNP    Component Value Date/Time   BNP 951.1 (H) 06/16/2023 1536   BNP 32.2 10/01/2011 1432    ProBNP No results found for: "PROBNP"  Imaging: No results found.   Assessment & Plan:   1. SLEEP APNEA, OBSTRUCTIVE (Primary)  Assessment and Plan    Sleep apnea Suspected sleep apnea. No prior sleep study available for review. Admitted in January for  acute respiratory failure, acute heart failure and AKI requiring intubation and BIPAP. He had some difficulty tolerating BiPAP due to mask discomfort. Interested in [place] device.  Experiences nocturnal awakenings from diuretics and occasional gasping. No current oxygen therapy. Discussed Inspire device surgery.  - Scheduled for split night sleep study at [facility] in May. - Consider BiPAP or CPAP trial post-sleep study. - Discuss Inspire device if BiPAP/CPAP not tolerated.  Congestive heart failure Previously hospitalized requiring intubation and BiPAP. Currently asymptomatic. On torsemide  twice daily. Reports frequent urination and nocturia. Adjusted diuretic timing to reduce nocturia. - Advise taking second diuretic dose at 5 PM to reduce nocturia.  Acute kidney injury Resolved without need for dialysis. No current renal issues.  Follow-up Plans for follow-up on sleep study and treatment adjustments. - Follow up on sleep study results and adjust treatment plan.      Antonio Baumgarten, NP 07/28/2023

## 2023-07-28 NOTE — Telephone Encounter (Addendum)
 Patient was ordered for split night study by another provider, has this been scheduled?

## 2023-07-29 ENCOUNTER — Telehealth: Payer: Self-pay | Admitting: Family Medicine

## 2023-07-29 NOTE — Telephone Encounter (Signed)
 Pt and wife contacted but received "call cannot be completed as dialed" for both. Sent pt Mychart message letting him know Tresiba  was sent today by provider.   insulin  degludec (TRESIBA  FLEXTOUCH) 100 UNIT/ML FlexTouch Pen 60 mL 1 07/29/2023 --   Sig: INJECT 72 UNITS IN THE MORNING AND 68 UNITS IN THE EVENING   Sent to pharmacy as: insulin  degludec (TRESIBA  FLEXTOUCH) 100 UNIT/ML FlexTouch Pen   E-Prescribing Status: Receipt confirmed by pharmacy (07/29/2023  4:57 PM EDT)    Copied from CRM #[id number]. Topic: Clinical - Prescription Issue >> Jul 29, 2023  4:33 PM [place] E wrote: Reason for CRM: Patient stated he went to go pick up his insulin  degludec (TRESIBA  FLEXTOUCH) 100 UNIT/ML FlexTouch Pen, and they sent in the fast-acting one, when he needs slow-acting. Patient is out of the slow-acting insulin  pen. Callback number for patient is [phone].

## 2023-08-01 NOTE — Telephone Encounter (Signed)
 Spoke with pt and he confirmed he picked up the medication Saturday

## 2023-08-04 ENCOUNTER — Ambulatory Visit (INDEPENDENT_AMBULATORY_CARE_PROVIDER_SITE_OTHER): Admitting: Family Medicine

## 2023-08-04 ENCOUNTER — Encounter: Payer: Self-pay | Admitting: Family Medicine

## 2023-08-04 VITALS — BP 111/67 | HR 87 | Temp 98.9°F | Ht 73.0 in | Wt 291.0 lb

## 2023-08-04 DIAGNOSIS — I480 Paroxysmal atrial fibrillation: Secondary | ICD-10-CM

## 2023-08-04 DIAGNOSIS — N179 Acute kidney failure, unspecified: Secondary | ICD-10-CM | POA: Diagnosis not present

## 2023-08-04 DIAGNOSIS — Z79899 Other long term (current) drug therapy: Secondary | ICD-10-CM | POA: Diagnosis not present

## 2023-08-04 DIAGNOSIS — Z794 Long term (current) use of insulin: Secondary | ICD-10-CM

## 2023-08-04 DIAGNOSIS — E1142 Type 2 diabetes mellitus with diabetic polyneuropathy: Secondary | ICD-10-CM

## 2023-08-04 LAB — POCT GLYCOSYLATED HEMOGLOBIN (HGB A1C)
HbA1c POC (<> result, manual entry): 7.4 % (ref 4.0–5.6)
HbA1c, POC (controlled diabetic range): 7.4 % — AB (ref 0.0–7.0)
HbA1c, POC (prediabetic range): 7.4 % — AB (ref 5.7–6.4)
Hemoglobin A1C: 7.4 % — AB (ref 4.0–5.6)

## 2023-08-04 NOTE — Progress Notes (Signed)
 OFFICE VISIT  08/04/2023  CC: No chief complaint on file.  Patient is a 58 y.o. male who presents for 1 month follow-up diabetes. A/P as of last visit: "#1 diabetes with complication, historically poor control. Improving. Continue Tresiba  36 units twice a day.  He will continue 10 units of NovoLog  with breakfast and lunch but decrease his supper NovoLog  to 5 units. Next A1c in 1 mo.   #2 hypertension, normal blood pressure without antihypertensive lately.   #3 Acute kidney injury requiring short-term hemodialysis. Renal function has gradually improved, most recent serum creatinine was 1.33 about 1 week ago.   #4 CHF.  Suspect stress cardiomyopathy in the setting of sepsis. CHF clinic managing. Volume status has improved significantly.  Weight is stable around 294 pounds. Continue torsemide  40 mg twice a day. He is off Lopressor . He has metolazone  2.5 mg tabs to use as needed.  Farxiga  was prescribed but he is unable to afford this. Cardiology has plans for follow-up echo in about 6 weeks."  INTERIM HX: Dylan Owen says he feels well. Breathing is normal. Weight has been stable.  He has been monitoring glucose closely.  He has pretty erratic numbers some days but other days stays within the "green zone".  ROS --> no fevers, no CP, no SOB, no wheezing, no cough, no dizziness, no HAs, no rashes, no melena/hematochezia.  No polyuria or polydipsia.   No focal weakness, paresthesias, or tremors.  No acute vision or hearing abnormalities.  No dysuria or unusual/new urinary urgency or frequency.  No recent changes in lower legs. No n/v/d or abd pain.  No palpitations.    Past Medical History:  Diagnosis Date   B12 deficiency    Chronic low back pain    Dr. Valda Garnet is pain mgmt MD   Depression    Diabetes mellitus with complication (HCC)    DPN + microalbuminuria   GERD (gastroesophageal reflux disease)    History of acute renal failure    Required hemodialysis in hospital for  cardiogenic shock 05/2023   History of adenomatous polyp of colon 05/22/2021   recall 7 yrs   Hypercholesterolemia    Statin intolerant.  Started Repatha  10/2021   Hypertension    Nonischemic cardiomyopathy (HCC)    Acute heart failure due to sepsis/shock.  EF 40 to 45% 05/2023.   OSA (obstructive sleep apnea)    on nighttime home O2 (refuses to wear CPAP because of claustrophobia)   PAF (paroxysmal atrial fibrillation) (HCC)    Initial diagnosis during hospitalization for shock 05/2023.  DCCV successful in hospital.  Discharged home on amiodarone  and Eliquis .   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 + chronic lumbar radiculopathy   Persistent asthma    Never smoker   Seizures (HCC)    Statin intolerance    myalgias   Subclinical hypothyroidism 06/2017    Past Surgical History:  Procedure Laterality Date   COLONOSCOPY  05/22/2021   Adenomas--recall 7 years   CYSTOSCOPY WITH RETROGRADE URETHROGRAM N/A 08/21/2021   Procedure: RETROGRADE URETHROGRAM;  Surgeon: Andrez Banker, MD;  Location: [facility];  Service: Urology;  Laterality: N/A;   CYSTOSCOPY WITH URETHRAL DILATATION N/A 08/21/2021   Procedure: CYSTOSCOPY WITH URETHRAL DILATATION OPTILUME;  Surgeon: Andrez Banker, MD;  Location: [facility];  Service: Urology;  Laterality: N/A;   IR FLUORO GUIDE CV LINE RIGHT  05/19/2023   IR REMOVAL TUN CV CATH W/O FL  06/03/2023   IR US  GUIDE VASC ACCESS RIGHT  05/19/2023  LUMBAR SPINE SURGERY     lumbar laminectomy with hardware stabilization, with ensuing arachnoiditis   RIGHT HEART CATH N/A 05/13/2023   normal R and L heart pressures.  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    Procedure: RIGHT HEART CATH;  Surgeon: Alwin Baars, DO;  Location: [facility];  Service: Cardiovascular;  Laterality: N/A;   TRANSTHORACIC ECHOCARDIOGRAM     05/12/23 EF 40-45%, regional WMA, valves good.   URETHRAL STRICTURE  DILATATION  1996 and 2004.   Laser surgery.    Outpatient Medications Prior to Visit  Medication Sig Dispense Refill   Acetaminophen  (TYLENOL  PO) Take 500 mg by mouth daily as needed (pain).     albuterol  (VENTOLIN  HFA) 108 (90 Base) MCG/ACT inhaler INHALE 1-2 PUFFS BY MOUTH EVERY 6 HOURS AS NEEDED FOR WHEEZE OR SHORTNESS OF BREATH 6.7 each 2   amiodarone  (PACERONE ) 200 MG tablet TAKE 1 TABLET BY MOUTH EVERY DAY 30 tablet 0   B-D ULTRAFINE III SHORT PEN 31G X 8 MM MISC USE AS DIRECTED TWICE DAILY FOR MEDICATION INJECTION 100 each 1   blood glucose meter kit and supplies KIT Dispense based on patient and insurance preference. Use to check glucose 4 times daily. 1 each 0   cetirizine  (ZYRTEC ) 10 MG tablet TAKE 1 TABLET BY MOUTH DAILY AS NEEDED FOR ITCHING 90 tablet 1   Continuous Glucose Receiver (FREESTYLE LIBRE 3 READER) DEVI Use to check glucose continuously 2 each 3   Continuous Glucose Sensor (FREESTYLE LIBRE 3 SENSOR) MISC PLACE 1 SENSOR ON THE SKIN EVERY 14 DAYS. USE TO CHECK GLUCOSE CONTINUOUSLY 2 each 3   ELIQUIS  5 MG TABS tablet TAKE 1 TABLET BY MOUTH TWICE A DAY 60 tablet 0   esomeprazole  (NEXIUM ) 40 MG capsule Take 1 capsule (40 mg total) by mouth daily. (Patient taking differently: Take 40 mg by mouth 2 (two) times daily before a meal.) 90 capsule 1   Evolocumab  (REPATHA  SURECLICK) 140 MG/ML SOAJ INJECT 1 DOSE INTO THE SKIN EVERY 14 (FOURTEEN) DAYS. 6 mL 3   FREESTYLE TEST STRIPS test strip USE TO CHECK GLUCOSE 4 TIMES DAILY. 200 strip 4   insulin  aspart (NOVOLOG  FLEXPEN) 100 UNIT/ML FlexPen USE 10 UNITS SUBCUTANEOUSLY WITH BREAKFAST AND LUNCH AND USE 5 UNITS AT SUPPER. 15 mL 0   insulin  degludec (TRESIBA  FLEXTOUCH) 100 UNIT/ML FlexTouch Pen INJECT 72 UNITS IN THE MORNING AND 68 UNITS IN THE EVENING 60 mL 1   methocarbamol  (ROBAXIN ) 500 MG tablet Take 500 mg by mouth 2 (two) times daily as needed for muscle spasms.     metolazone  (ZAROXOLYN ) 2.5 MG tablet Take 1 tablet (2.5 mg total)  by mouth as directed. Only take after you have spoken to the [facility] 15 tablet 2   morphine  (MS CONTIN ) 30 MG 12 hr tablet Take 30-60 mg by mouth See admin instructions. Taking 60 mg in the AM and 30 mg in the afternoon and 60 mg at bedtime     Multiple Vitamin (MULTIVITAMIN) tablet Take 1 tablet by mouth daily.     torsemide  (DEMADEX ) 20 MG tablet Take 2 tablets (40 mg total) by mouth 2 (two) times daily. 180 tablet 3   dapagliflozin  propanediol (FARXIGA ) 10 MG TABS tablet Take 1 tablet (10 mg total) by mouth daily before breakfast. (Patient not taking: Reported on 08/04/2023) 30 tablet 11   nitroGLYCERIN  (NITROSTAT ) 0.4 MG SL tablet PLACE 1 TABLET  UNDER THE TONGUE EVERY 5 MINUTES AS NEEDED. (Patient not taking: Reported on 08/04/2023) 25 tablet 1   No facility-administered medications prior to visit.    Allergies  Allergen Reactions   Ace Inhibitors     Kidney failure   Atorvastatin  Other (See Comments)    Arm pain   Prednisone     REACTION: Anxiety   Septra  [Sulfamethoxazole -Trimethoprim ]    Metformin  And Related Diarrhea    Review of Systems As per HPI  PE:    08/04/2023    3:21 PM 07/28/2023    9:31 AM 07/07/2023    2:40 PM  Vitals with BMI  Height 6\' 1"   6\' 1"   Weight 291 lbs  293 lbs 13 oz  BMI 38.4  38.77  Systolic 111 114 578  Diastolic 67 62 75  Pulse 87 101 59     Physical Exam  Gen: Alert, well appearing.  Patient is oriented to person, place, time, and situation. AFFECT: pleasant, lucid thought and speech. CV: RRR, no m/r/g.   LUNGS: CTA bilat, nonlabored resps, good aeration in all lung fields. EXT: no clubbing or cyanosis.  No pitting edema.    LABS:  Last CBC Lab Results  Component Value Date   WBC 10.1 06/23/2023   HGB 12.7 (L) 06/23/2023   HCT 39.1 06/23/2023   MCV 88.7 06/23/2023   MCH 27.3 05/28/2023   RDW 18.5 (H) 06/23/2023   PLT 269.0 06/23/2023   Last metabolic panel Lab Results  Component Value Date   GLUCOSE 212 (H)  06/27/2023   NA 136 06/27/2023   K 3.7 06/27/2023   CL 102 06/27/2023   CO2 25 06/27/2023   BUN 30 (H) 06/27/2023   CREATININE 1.33 (H) 06/27/2023   GFRNONAA >60 06/27/2023   CALCIUM  8.9 06/27/2023   PHOS 4.2 05/29/2023   PROT 7.6 06/01/2023   ALBUMIN  3.3 (L) 06/01/2023   BILITOT 1.0 06/01/2023   ALKPHOS 65 06/01/2023   AST 19 06/01/2023   ALT 19 06/01/2023   ANIONGAP 9 06/27/2023   Last lipids Lab Results  Component Value Date   CHOL 120 05/07/2023   HDL 21 (L) 05/07/2023   LDLCALC 84 05/07/2023   LDLDIRECT 166.2 11/09/2013   TRIG 200 (H) 05/12/2023   CHOLHDL 5.7 05/07/2023   Last hemoglobin A1c Lab Results  Component Value Date   HGBA1C 7.4 (A) 08/04/2023   HGBA1C 7.4 08/04/2023   HGBA1C 7.4 (A) 08/04/2023   HGBA1C 7.4 (A) 08/04/2023   Last thyroid  functions Lab Results  Component Value Date   TSH 1.853 05/05/2023   T3TOTAL 127 10/18/2019   Lab Results  Component Value Date   VITAMINB12 444 10/18/2019   IMPRESSION AND PLAN:  #1 diabetes with neuropathy. Control is much improved. Point-of-care hemoglobin A1c today is 7.4%. He will continue 36 units of Tresiba  twice a day and 5 to 10 units of NovoLog  at each meal.  #2 hypertension, normal blood pressure without antihypertensive lately.   #3 Acute kidney injury requiring short-term hemodialysis in hosp. Renal function has gradually improved, most recent serum creatinine was 1.33 about 5 weeks ago. Monitor basic metabolic panel today. He will be having nephrology follow-up pretty soon. Of note, he says a new medication was added at his most recent nephrology visit.  He does not recall the name.  Will try to get the record and/or he will call back with the name and dose.  #4 CHF.  Suspect stress cardiomyopathy in the setting of sepsis. CHF clinic  managing. Volume status has improved significantly.  Weight is stable around 290-294 lbs on our scale. Continue torsemide  40 mg twice a day. He is off  Lopressor . He has metolazone  2.5 mg tabs to use as needed.  Farxiga  was prescribed but he is unable to afford this. A follow-up echocardiogram is scheduled for 08/17/2023. Checking electrolytes and creatinine today.  5.  A-fib, history of DCCV. He is maintaining sinus rhythm. He is on amiodarone  and Eliquis . Monitor TSH today.  An After Visit Summary was printed and given to the patient.  FOLLOW UP: Return in about 3 months (around 11/04/2023) for routine chronic illness f/u.  Signed:  Arletha Lady, MD           08/04/2023

## 2023-08-04 NOTE — Patient Instructions (Signed)
   Your A1c was 7.4

## 2023-08-05 LAB — TSH: TSH: 2.63 u[IU]/mL (ref 0.35–5.50)

## 2023-08-05 LAB — BASIC METABOLIC PANEL WITH GFR
BUN: 37 mg/dL — ABNORMAL HIGH (ref 6–23)
CO2: 32 meq/L (ref 19–32)
Calcium: 9.5 mg/dL (ref 8.4–10.5)
Chloride: 100 meq/L (ref 96–112)
Creatinine, Ser: 1.47 mg/dL (ref 0.40–1.50)
GFR: 52.71 mL/min — ABNORMAL LOW (ref >60.00)
Glucose, Bld: 179 mg/dL — ABNORMAL HIGH (ref 70–99)
Potassium: 5.2 meq/L — ABNORMAL HIGH (ref 3.5–5.1)
Sodium: 142 meq/L (ref 135–145)

## 2023-08-05 LAB — MICROALBUMIN / CREATININE URINE RATIO
Creatinine,U: 40.1 mg/dL
Microalb Creat Ratio: 178.5 mg/g — ABNORMAL HIGH (ref 0.0–30.0)
Microalb, Ur: 7.2 mg/dL — ABNORMAL HIGH (ref 0.0–1.9)

## 2023-08-06 ENCOUNTER — Encounter: Payer: Self-pay | Admitting: Family Medicine

## 2023-08-08 ENCOUNTER — Telehealth: Payer: Self-pay

## 2023-08-08 NOTE — Telephone Encounter (Signed)
 Sent as FYI. Please see below.  Copied from CRM [id number]. Topic: General - Other >> Aug 08, 2023  2:05 PM [place] E wrote: Reason for CRM: Cornel Diesel from [facility] called to relay that they are discharging the patient form physical therapy today 5/5.  CRM # [phone] Owner: None Status: Unresolved Open  Priority: Routine Created on: 08/08/2023 02:06 PM By: Kem Patten   Primary Information  Source  Glasscock, Lorence Rolls (Patient)   Subject  Tirey, Lorence Rolls (Patient)   Topic  Clinical - Medical Advice    Communication  Reason for CRM: Cornel Diesel from [facility] want to inform patient's PCP that his weight today was 286 lbs when his parameters are 307-317. Cornel Diesel did not have any concerns for the patient. Callback number for Cornel Diesel is [phone].     Patient Information  Patient Name Gender DOB SSN  Jorin, Lersch Male [id number]   Contacts  Contact Date/Time Type Contact Phone/Fax  08/08/2023 02:06 PM EDT Phone ([address]) [place], [place] [phone]   Routing History   From To Priority  08/08/2023 02:07 PM [place], [place] A P [facility] Routine

## 2023-08-09 DIAGNOSIS — E1142 Type 2 diabetes mellitus with diabetic polyneuropathy: Secondary | ICD-10-CM | POA: Diagnosis not present

## 2023-08-10 NOTE — Telephone Encounter (Signed)
 Left a message for Dylan Owen to get an update from the Precert team on the Split Night Study if they will still be scheduling the patient or if we should just order the Split Night Study. Waiting on response.

## 2023-08-11 ENCOUNTER — Telehealth (HOSPITAL_COMMUNITY): Payer: Self-pay | Admitting: Vascular Surgery

## 2023-08-11 NOTE — Telephone Encounter (Signed)
 Lvm giving pt sleep study appt 7/10

## 2023-08-16 NOTE — Telephone Encounter (Signed)
 The patient is scheduled for their Split Night Study on July 10th.

## 2023-08-17 ENCOUNTER — Telehealth: Payer: Self-pay

## 2023-08-17 ENCOUNTER — Encounter: Payer: Self-pay | Admitting: Family Medicine

## 2023-08-17 ENCOUNTER — Ambulatory Visit (HOSPITAL_COMMUNITY)
Admission: RE | Admit: 2023-08-17 | Discharge: 2023-08-17 | Disposition: A | Discharge status: Home / Self Care | Source: Ambulatory Visit | Attending: Internal Medicine | Admitting: Internal Medicine

## 2023-08-17 ENCOUNTER — Ambulatory Visit (HOSPITAL_BASED_OUTPATIENT_CLINIC_OR_DEPARTMENT_OTHER)
Admission: RE | Admit: 2023-08-17 | Discharge: 2023-08-17 | Disposition: A | Discharge status: Home / Self Care | Source: Ambulatory Visit | Attending: Cardiology | Admitting: Cardiology

## 2023-08-17 ENCOUNTER — Other Ambulatory Visit (HOSPITAL_COMMUNITY): Payer: Self-pay

## 2023-08-17 ENCOUNTER — Ambulatory Visit (HOSPITAL_COMMUNITY): Payer: Self-pay | Admitting: Internal Medicine

## 2023-08-17 DIAGNOSIS — I5022 Chronic systolic (congestive) heart failure: Secondary | ICD-10-CM | POA: Diagnosis not present

## 2023-08-17 DIAGNOSIS — I4891 Unspecified atrial fibrillation: Secondary | ICD-10-CM | POA: Insufficient documentation

## 2023-08-17 DIAGNOSIS — E1122 Type 2 diabetes mellitus with diabetic chronic kidney disease: Secondary | ICD-10-CM | POA: Diagnosis not present

## 2023-08-17 DIAGNOSIS — Z7901 Long term (current) use of anticoagulants: Secondary | ICD-10-CM | POA: Insufficient documentation

## 2023-08-17 DIAGNOSIS — Z794 Long term (current) use of insulin: Secondary | ICD-10-CM | POA: Diagnosis not present

## 2023-08-17 DIAGNOSIS — Z79899 Other long term (current) drug therapy: Secondary | ICD-10-CM | POA: Diagnosis not present

## 2023-08-17 DIAGNOSIS — N1831 Chronic kidney disease, stage 3a: Secondary | ICD-10-CM | POA: Diagnosis not present

## 2023-08-17 DIAGNOSIS — Z7984 Long term (current) use of oral hypoglycemic drugs: Secondary | ICD-10-CM | POA: Diagnosis not present

## 2023-08-17 DIAGNOSIS — Z006 Encounter for examination for normal comparison and control in clinical research program: Secondary | ICD-10-CM

## 2023-08-17 LAB — ECHOCARDIOGRAM COMPLETE
AR max vel: 2.5 cm2
AV Area VTI: 3.41 cm2
AV Area mean vel: 2.85 cm2
AV Mean grad: 2 mmHg
AV Peak grad: 4.6 mmHg
Ao pk vel: 1.07 m/s
Area-P 1/2: 5.84 cm2
Calc EF: 50 %
MV VTI: 2.86 cm2
S' Lateral: 2.9 cm
Single Plane A2C EF: 50 %
Single Plane A4C EF: 51.5 %

## 2023-08-17 MED ORDER — APIXABAN 5 MG PO TABS: 5.0000 mg | ORAL_TABLET | Freq: Two times a day (BID) | ORAL | 3 refills | Status: AC

## 2023-08-17 MED ORDER — PERFLUTREN LIPID MICROSPHERE
1.0000 mL | INTRAVENOUS | Status: DC | PRN
  Administered 2023-08-17: 2 mL via INTRAVENOUS
  Filled 2023-08-17: qty 10

## 2023-08-17 MED ORDER — DAPAGLIFLOZIN PROPANEDIOL 10 MG PO TABS: 10.0000 mg | ORAL_TABLET | Freq: Every day | ORAL | 3 refills | Status: AC

## 2023-08-17 MED ORDER — NITROGLYCERIN 0.4 MG SL SUBL: 0.4000 mg | SUBLINGUAL_TABLET | SUBLINGUAL | 4 refills | Status: AC | PRN

## 2023-08-17 NOTE — Research (Signed)
 SITE: [id number]     Subject # [id number]   Subprotocol: A  Inclusion Criteria  Patients who meet all of the following criteria are eligible for enrollment as study participants:  Yes No  Age > 57 years old X   Eligible to wear Holter Study X    Exclusion Criteria  Patients who meet any of these criteria are not eligible for enrollment as study participants: Yes No  1. Receiving any mechanical (respiratory or circulatory) or renal support therapy at Screening or during Visit #1.  X  2.  Any other conditions that in the opinion of the investigators are likely to prevent compliance with the study protocol or pose a safety concern if the subject participates in the study.  X  3. Poor tolerance, namely susceptible to severe skin allergies from ECG adhesive patch application.  X   Protocol: REV H    60 minute start window         Cor device must be applied, and the study initiated, no later than 60 minutes of completing the Echocardiogram                             HH:MM  Echo completion time  13:58  2.   Cor Study start time  14:07   30-Minute execution window  Once Cor Monitoring begins, 3 QT Med ECGs and the 15-minute rest period must be completed within a 30 minute window     HH:MM  3. QT Med ECG Completion time  14:11  4. Start of 15-Min sitting rest period  14:11  5. End of 15-Min rest period  14:27  6. Time of device removal  14:38   *Continue to use the Mobile App Event feature to log the Rest period windows and follow instructions on the EF-ACT Clinical Trial  Patient Instruction Card.  Describe any anomalies in Protocol execution in the Protocol Deviation Log    Residential Zip code [phone] (First 3 digits ONLY)                                           [facility] Informed Consent   Subject Name: Dylan Owen  Subject met inclusion and exclusion criteria.  The informed consent form, study requirements and expectations were reviewed with the subject. Subject had opportunity to  read consent and questions and concerns were addressed prior to the signing of the consent form.  The subject verbalized understanding of the trial requirements.  The subject agreed to participate in the [facility] EF ACT trial and signed the informed consent at 14:00 on 17-Aug-2023.  The informed consent was obtained prior to performance of any protocol-specific procedures for the subject.  A copy of the signed informed consent was given to the subject and a copy was placed in the subject's medical record.   Jolee Naval          Current Outpatient Medications:    Acetaminophen  (TYLENOL  PO), Take 500 mg by mouth daily as needed (pain)., Disp: , Rfl:    albuterol  (VENTOLIN  HFA) 108 (90 Base) MCG/ACT inhaler, INHALE 1-2 PUFFS BY MOUTH EVERY 6 HOURS AS NEEDED FOR WHEEZE OR SHORTNESS OF BREATH, Disp: 6.7 each, Rfl: 2   amiodarone  (PACERONE ) 200 MG tablet, TAKE 1 TABLET BY MOUTH EVERY DAY, Disp: 30 tablet, Rfl: 0   apixaban  (  ELIQUIS ) 5 MG TABS tablet, Take 1 tablet (5 mg total) by mouth 2 (two) times daily., Disp: 180 tablet, Rfl: 3   B-D ULTRAFINE III SHORT PEN 31G X 8 MM MISC, USE AS DIRECTED TWICE DAILY FOR MEDICATION INJECTION, Disp: 100 each, Rfl: 1   blood glucose meter kit and supplies KIT, Dispense based on patient and insurance preference. Use to check glucose 4 times daily., Disp: 1 each, Rfl: 0   cetirizine  (ZYRTEC ) 10 MG tablet, TAKE 1 TABLET BY MOUTH DAILY AS NEEDED FOR ITCHING, Disp: 90 tablet, Rfl: 1   Continuous Glucose Receiver (FREESTYLE LIBRE 3 READER) DEVI, Use to check glucose continuously, Disp: 2 each, Rfl: 3   Continuous Glucose Sensor (FREESTYLE LIBRE 3 SENSOR) MISC, PLACE 1 SENSOR ON THE SKIN EVERY 14 DAYS. USE TO CHECK GLUCOSE CONTINUOUSLY, Disp: 2 each, Rfl: 3   dapagliflozin  propanediol (FARXIGA ) 10 MG TABS tablet, Take 1 tablet (10 mg total) by mouth daily before breakfast., Disp: 90 tablet, Rfl: 3   esomeprazole  (NEXIUM ) 40 MG capsule, Take 1 capsule (40 mg total) by  mouth daily., Disp: 90 capsule, Rfl: 1   Evolocumab  (REPATHA  SURECLICK) 140 MG/ML SOAJ, INJECT 1 DOSE INTO THE SKIN EVERY 14 (FOURTEEN) DAYS., Disp: 6 mL, Rfl: 3   FREESTYLE TEST STRIPS test strip, USE TO CHECK GLUCOSE 4 TIMES DAILY., Disp: 200 strip, Rfl: 4   insulin  aspart (NOVOLOG  FLEXPEN) 100 UNIT/ML FlexPen, USE 10 UNITS SUBCUTANEOUSLY WITH BREAKFAST AND LUNCH AND USE 5 UNITS AT SUPPER., Disp: 15 mL, Rfl: 0   Insulin  Degludec (TRESIBA  [place]), Inject 36 Units into the skin in the morning. 26 units in the evening, Disp: , Rfl:    methocarbamol  (ROBAXIN ) 500 MG tablet, Take 500 mg by mouth 2 (two) times daily as needed for muscle spasms., Disp: , Rfl:    metolazone  (ZAROXOLYN ) 2.5 MG tablet, Take 1 tablet (2.5 mg total) by mouth as directed. Only take after you have spoken to the Heart Failure Clinic, Disp: 15 tablet, Rfl: 2   morphine  (MS CONTIN ) 30 MG 12 hr tablet, Take 30-60 mg by mouth See admin instructions. Taking 60 mg in the AM and 30 mg in the afternoon and 60 mg at bedtime, Disp: , Rfl:    Multiple Vitamin (MULTIVITAMIN) tablet, Take 1 tablet by mouth daily., Disp: , Rfl:    nitroGLYCERIN  (NITROSTAT ) 0.4 MG SL tablet, Place 1 tablet (0.4 mg total) under the tongue every 5 (five) minutes as needed for chest pain., Disp: 25 tablet, Rfl: 4   torsemide  (DEMADEX ) 20 MG tablet, Take 2 tablets (40 mg total) by mouth 2 (two) times daily., Disp: 180 tablet, Rfl: 3 No current facility-administered medications for this visit.  Facility-Administered Medications Ordered in Other Visits:    perflutren  lipid microspheres (DEFINITY ) IV suspension, 1-10 mL, Intravenous, PRN, Wynetta Heckle B, RN, 2 mL at 08/17/23 1404

## 2023-08-17 NOTE — Telephone Encounter (Signed)
 Forms faxed back.

## 2023-08-17 NOTE — Telephone Encounter (Signed)
 Form received via Fax, to be filled out by provider. Requested to send it back via Fax within 5-days.  Placed on PCP desk to review and sign, if appropriate.

## 2023-08-17 NOTE — Telephone Encounter (Signed)
 Signed and put in box to go up front. Signed:  Arletha Lady, MD           08/17/2023

## 2023-08-17 NOTE — Patient Instructions (Addendum)
 There has been no changes to your medications.  Your physician recommends that you schedule a follow-up appointment in: 4 months. (September) ** PLEASE CALL THE OFFICE IN JULY TO ARRANGE YOUR FOLLOW UP APPOINTMENT.*8  If you have any questions or concerns before your next appointment please send us  a message through [place] or call our office at [phone].    TO LEAVE A MESSAGE FOR THE NURSE SELECT OPTION 2, PLEASE LEAVE A MESSAGE INCLUDING: YOUR NAME DATE OF BIRTH CALL BACK NUMBER REASON FOR CALL**this is important as we prioritize the call backs  YOU WILL RECEIVE A CALL BACK THE SAME DAY AS LONG AS YOU CALL BEFORE 4:00 PM  At the [facility], you and your health needs are our priority. As part of our continuing mission to provide you with exceptional heart care, we have created designated Provider Care Teams. These Care Teams include your primary Cardiologist (physician) and Advanced Practice Providers (APPs- Physician Assistants and Nurse Practitioners) who all work together to provide you with the care you need, when you need it.   You may see any of the following providers on your designated Care Team at your next follow up: Dr Jules Oar Dr Peder Bourdon Dr. Alwin Baars Dr. Arta Lark Amy Marijane Shoulders, NP Ruddy Corral, [place] [facility] [place], [place] Dennise Fitz, NP [place] Lee, NP Shaw[place] Dellen, NP Luster Salters, PharmD Bevely Brush, PharmD   Please be sure to bring in all your medications bottles to every appointment.    Thank you for choosing [facility]

## 2023-08-17 NOTE — Progress Notes (Signed)
  Echocardiogram 2D Echocardiogram has been performed.  Tarquin Welcher L Adin Laker RDCS 08/17/2023, 2:04 PM

## 2023-08-21 NOTE — Progress Notes (Signed)
   ADVANCED HEART FAILURE FOLLOW UP CLINIC NOTE  Referring Physician: Shelvia Dick, MD  Primary Care: Shelvia Dick, MD Primary Cardiologist:  HPI: Dylan Owen is a 58 y.o. male who presents for follow up of chronic systolic heart failure.      Admitted 05/04/23 to ICU with septic shock after presenting with complaints of myalgias, dizziness, and sore throat. Initial echo showed EF 25%, however images were poor. 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 He was started on Milrinone  and Lasix , however developed AKI that progressed to anuria requiring CRRT then iHD. Repeat echo 2/6 showed EF 40-45% with RWMA. RHC 2/7 with normal R and L filling pressures, elevated PA and PVR (likely Group II + III), and mod reduced CO/CI. Renal function improved and he was liberated from [facility] and discharged. Discharge weight 306 lbs.      SUBJECTIVE:  Patient overall reports that he is doing fairly well.  His ejection fraction has nearly normalized, red around 45 to 50%.  He reports that he is working with physical therapy and is starting to be more active.  He had a very long hospital stay and recovery has been slow, but he has been staying positive and is motivated.  He is anxious to see his grandchild soon.  PMH, current medications, allergies, social history, and family history reviewed in epic.  PHYSICAL EXAM: Vitals:   08/17/23 1401  BP: 118/70  Pulse: 82  SpO2: 99%   GENERAL: Chronically ill-appearing, obese PULM: Normal work of breathing CARDIAC:  JVP: Flat         Normal rate with regular rhythm. No murmurs, rubs or gallops.  1+ lower extremity edema. Warm and well perfused extremities. ABDOMEN: Soft, non-tender, non-distended. NEUROLOGIC: Patient is oriented x3 with no focal or lateralizing neurologic deficits.    DATA REVIEW  ECG: 06/09/2023: Normal sinus rhythm, 73  ECHO: 08/17/2023: LVEF 45 to 50%, basal to mid inferoseptal akinesis 05/05/23: LVEF difficult to assess,  but likely less than 30%.  CATH: 05/13/2023: RA 6, PA 39/26 (33), 10, TD CO/CI 5/2  ASSESSMENT & PLAN:  Chronic heart failure with mildly reduced ejection fraction: Etiology suspected postviral, no ischemic workup but does not have anginal symptoms currently. EF further improved on last echo.  - Continue dapagliflozin  10 mg daily, torsemide  40 mg twice daily - Spironolactone  potentially future, holding given recent borderline hyperK, could consider finenerone - Metolazone  as needed, has not had to use recently Blood pressure well-controlled, could consider ARB in the future - No ischemic evaluation needed at this time  Atrial fibrillation: DCCV on 2/7. 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 - PFTs and sleep study  CKD: Stage IIIa. - SGLT-2, follow up with nephrology, consider nonsteroidal MRA in the future  Diabetes: poorly controlled, A1c 10.2 . -on insulin  per PCP  Follow up in 4 months  Arta Lark, MD Advanced Heart Failure Mechanical Circulatory Support 08/21/23

## 2023-08-26 ENCOUNTER — Other Ambulatory Visit: Payer: Self-pay | Admitting: Family Medicine

## 2023-08-28 ENCOUNTER — Other Ambulatory Visit: Payer: Self-pay | Admitting: Internal Medicine

## 2023-08-28 DIAGNOSIS — E785 Hyperlipidemia, unspecified: Secondary | ICD-10-CM

## 2023-08-30 DIAGNOSIS — M961 Postlaminectomy syndrome, not elsewhere classified: Secondary | ICD-10-CM | POA: Diagnosis not present

## 2023-08-30 DIAGNOSIS — Z79891 Long term (current) use of opiate analgesic: Secondary | ICD-10-CM | POA: Diagnosis not present

## 2023-08-30 DIAGNOSIS — E1142 Type 2 diabetes mellitus with diabetic polyneuropathy: Secondary | ICD-10-CM | POA: Diagnosis not present

## 2023-08-30 DIAGNOSIS — G894 Chronic pain syndrome: Secondary | ICD-10-CM | POA: Diagnosis not present

## 2023-09-09 DIAGNOSIS — E1142 Type 2 diabetes mellitus with diabetic polyneuropathy: Secondary | ICD-10-CM | POA: Diagnosis not present

## 2023-09-22 ENCOUNTER — Other Ambulatory Visit (HOSPITAL_COMMUNITY): Payer: Self-pay

## 2023-09-22 MED ORDER — MORPHINE SULFATE ER 30 MG PO TBCR
EXTENDED_RELEASE_TABLET | ORAL | 0 refills | Status: AC
  Filled 2023-09-22: qty 150, 30d supply, fill #0

## 2023-09-28 ENCOUNTER — Telehealth: Payer: Self-pay

## 2023-09-28 NOTE — Telephone Encounter (Signed)
 ATC patient, no one answered so I left a vm.

## 2023-09-28 NOTE — Telephone Encounter (Signed)
 Copied from CRM [id number]. Topic: General - Other >> Sep 28, 2023 12:06 PM Shona S wrote: Reason for CRM: patient was contacted to reschedule appointment because doctor will not be in office but the next available appointment is in September, please call patient and schedule much sooner >> Sep 28, 2023 12:54 PM Thersia RAMAN wrote: Is Izetta not in office 6/30?  Izetta is in [place] on 10-03-23

## 2023-09-30 NOTE — Telephone Encounter (Signed)
 Dylan Owen is still on the schedule for Monday. Pt does not need to be moved

## 2023-10-03 ENCOUNTER — Ambulatory Visit: Admitting: Primary Care

## 2023-10-07 ENCOUNTER — Other Ambulatory Visit: Payer: Self-pay | Admitting: Family Medicine

## 2023-10-07 ENCOUNTER — Other Ambulatory Visit (HOSPITAL_COMMUNITY): Payer: Self-pay | Admitting: Cardiology

## 2023-10-13 ENCOUNTER — Ambulatory Visit (HOSPITAL_BASED_OUTPATIENT_CLINIC_OR_DEPARTMENT_OTHER): Attending: Internal Medicine | Admitting: Cardiology

## 2023-10-13 VITALS — Ht 72.0 in | Wt 300.0 lb

## 2023-10-13 DIAGNOSIS — G4736 Sleep related hypoventilation in conditions classified elsewhere: Secondary | ICD-10-CM | POA: Diagnosis not present

## 2023-10-13 DIAGNOSIS — I5022 Chronic systolic (congestive) heart failure: Secondary | ICD-10-CM | POA: Insufficient documentation

## 2023-10-13 DIAGNOSIS — G4733 Obstructive sleep apnea (adult) (pediatric): Secondary | ICD-10-CM | POA: Diagnosis not present

## 2023-10-24 ENCOUNTER — Other Ambulatory Visit (HOSPITAL_COMMUNITY): Payer: Self-pay

## 2023-10-24 DIAGNOSIS — Z79891 Long term (current) use of opiate analgesic: Secondary | ICD-10-CM | POA: Diagnosis not present

## 2023-10-24 DIAGNOSIS — M961 Postlaminectomy syndrome, not elsewhere classified: Secondary | ICD-10-CM | POA: Diagnosis not present

## 2023-10-24 DIAGNOSIS — G894 Chronic pain syndrome: Secondary | ICD-10-CM | POA: Diagnosis not present

## 2023-10-24 DIAGNOSIS — E1142 Type 2 diabetes mellitus with diabetic polyneuropathy: Secondary | ICD-10-CM | POA: Diagnosis not present

## 2023-10-24 MED ORDER — MORPHINE SULFATE ER 30 MG PO TBCR
30.0000 mg | EXTENDED_RELEASE_TABLET | Freq: Three times a day (TID) | ORAL | 0 refills | Status: AC | PRN
  Filled 2023-10-24: qty 150, 30d supply, fill #0

## 2023-10-24 MED ORDER — MORPHINE SULFATE ER 30 MG PO TBCR
30.0000 mg | EXTENDED_RELEASE_TABLET | Freq: Three times a day (TID) | ORAL | 0 refills | Status: AC
  Filled 2023-12-01: qty 150, 30d supply, fill #0

## 2023-10-24 MED ORDER — METHOCARBAMOL 500 MG PO TABS
500.0000 mg | ORAL_TABLET | Freq: Two times a day (BID) | ORAL | 1 refills | Status: DC | PRN
  Filled 2023-10-24: qty 60, 30d supply, fill #0
  Filled 2023-12-01: qty 60, 30d supply, fill #1

## 2023-10-25 NOTE — Procedures (Addendum)
 Indications for Polysomnography The patient is a 58 year-old Male who is 6' and weighs 300.0 lbs. His BMI equals 41.1.  A full night polysomnogram was performed to evaluate for obstructive sleep apnea.  MedicationNo Data. Polysomnogram Data A full night polysomnogram recorded the standard physiologic parameters including EEG, EOG, EMG, EKG, nasal and oral airflow.  Respiratory parameters of chest and abdominal movements were recorded with Respiratory Inductance Plethysmography belts.   Oxygen saturation was recorded by pulse oximetry.  Sleep Architecture The total recording time of the polysomnogram was 369.0 minutes.  The total sleep time was 234.0 minutes.  The patient spent 1.3% of total sleep time in Stage N1, 88.0% in Stage N2, 0.0% in Stages N3, and 10.7% in REM.  Sleep latency was 14.4 minutes.   REM latency was 141.5 minutes.  Sleep Efficiency was 63.4%.  Wake after Sleep Onset time was 120.5 minutes.  Respiratory Events The polysomnogram revealed a presence of 8 obstructive, 9 central, and 15 mixed apneas resulting in an Apnea index of 8.2 events per hour.  There were 62 hypopneas (GreaterEqual to3% desaturation and/or arousal) resulting in an Apnea\Hypopnea Index (AHI  GreaterEqual to3% desaturation and/or arousal) of 24.1 events per hour.  There were 31 hypopneas (GreaterEqual to4% desaturation) resulting in an Apnea\Hypopnea Index (AHI GreaterEqual to4% desaturation) of 16.2 events per hour.  There were 0 respiratory  Effort Related Arousals resulting in a RERA index of 0 events per hour. The Respiratory Disturbance Index is 24.1 events per hour.  The snore index was 0 events per hour.  Mean oxygen saturation was 93.1%.  The lowest oxygen saturation during sleep was 86.0%.  Time spent LessEqual to88% oxygen saturation was  minutes ().  Limb Activity There were 0 total limb movements recorded.  Cardiac Summary The average pulse rate was 74.7 bpm.  The minimum pulse rate was 59.0  bpm while the maximum pulse rate was 105.0 bpm.  Cardiac rhythm was normal.  Diagnosis: Moderate obstructive sleep apnea Nocturnal hypoxemia  Recommendations: 1.  Clinical correlation of these findings is necessary.  The decision to treat obstructive sleep apnea (OSA) is usually based on the presence of apnea symptoms or the presence of associated medical conditions such as Hypertension, Congestive Heart  Failure, Atrial Fibrillation or Obesity.  The most common symptoms of OSA are snoring, gasping for breath while sleeping, daytime sleepiness and fatigue.  2.  Initiating apnea therapy is recommended given the presence of symptoms and/or associated conditions. Recommend proceeding with one of the following:  a.  Auto-CPAP therapy with a pressure range of 5-20cm H2O.  b.  An oral appliance (OA) that can be obtained from certain dentists with expertise in sleep medicine.  These are primarily of use in non-obese patients with mild and moderate disease.  c.  An ENT consultation which may be useful to look for specific causes of obstruction and possible treatment options.  d.  If patient is intolerant to PAP therapy, consider referral to ENT for evaluation for hypoglossal nerve stimulator.  3.  Close follow-up is necessary to ensure success with CPAP or oral appliance therapy for maximum benefit.  4.  A follow-up oximetry study on CPAP is recommended to assess the adequacy of therapy and determine the need for supplemental oxygen or the potential need for Bi-level therapy.  An arterial blood gas to determine the adequacy of baseline ventilation  and oxygenation should also be considered.  5.  Healthy sleep recommendations include:  adequate nightly sleep (normal 7-9 hrs/night), avoidance  of caffeine after noon and alcohol near bedtime, and maintaining a sleep environment that is cool, dark and quiet.  6.  Weight loss for overweight patients is recommended.  Even modest amounts of weight loss  can significantly improve the severity of sleep apnea.  7.  Snoring recommendations include:  weight loss where appropriate, side sleeping, and avoidance of alcohol before bed.  8.  Operation of motor vehicle should be avoided when sleepy.    This study was personally reviewed and electronically signed by: Wilbert Bihari MD Accredited Board Certified in Sleep Medicine Date/Time: 10/25/2023 10:19AM

## 2023-11-04 ENCOUNTER — Ambulatory Visit: Admitting: Family Medicine

## 2023-11-04 ENCOUNTER — Encounter: Payer: Self-pay | Admitting: Family Medicine

## 2023-11-04 VITALS — BP 113/63 | HR 90 | Temp 98.2°F | Ht 72.0 in | Wt 308.0 lb

## 2023-11-04 DIAGNOSIS — I480 Paroxysmal atrial fibrillation: Secondary | ICD-10-CM

## 2023-11-04 DIAGNOSIS — E118 Type 2 diabetes mellitus with unspecified complications: Secondary | ICD-10-CM

## 2023-11-04 DIAGNOSIS — Z794 Long term (current) use of insulin: Secondary | ICD-10-CM

## 2023-11-04 DIAGNOSIS — I10 Essential (primary) hypertension: Secondary | ICD-10-CM | POA: Diagnosis not present

## 2023-11-04 LAB — POCT GLYCOSYLATED HEMOGLOBIN (HGB A1C)
HbA1c POC (<> result, manual entry): 8 % (ref 4.0–5.6)
HbA1c, POC (controlled diabetic range): 8 % — AB (ref 0.0–7.0)
HbA1c, POC (prediabetic range): 8 % — AB (ref 5.7–6.4)
Hemoglobin A1C: 8 % — AB (ref 4.0–5.6)

## 2023-11-04 MED ORDER — MUPIROCIN 2 % EX OINT: 1.0000 | TOPICAL_OINTMENT | Freq: Three times a day (TID) | CUTANEOUS | 0 refills | Status: AC

## 2023-11-04 MED ORDER — ESOMEPRAZOLE MAGNESIUM 40 MG PO CPDR: 40.0000 mg | DELAYED_RELEASE_CAPSULE | Freq: Every day | ORAL | 1 refills | Status: AC

## 2023-11-04 MED ORDER — NOVOLOG FLEXPEN 100 UNIT/ML ~~LOC~~ SOPN: PEN_INJECTOR | SUBCUTANEOUS | 3 refills | Status: AC

## 2023-11-04 NOTE — Progress Notes (Signed)
 OFFICE VISIT  11/04/2023  CC:  Chief Complaint  Patient presents with   Medical Management of Chronic Issues    Patient is a 58 y.o. male who presents for 21-month follow-up diabetes, hypertension, and PAF. A/P as of last visit: #1 diabetes with neuropathy. Control is much improved. Point-of-care hemoglobin A1c today is 7.4%. He will continue 36 units of Tresiba  twice a day and 5 to 10 units of NovoLog  at each meal.   #2 hypertension, normal blood pressure without antihypertensive lately.   #3 Acute kidney injury requiring short-term hemodialysis in hosp. Renal function has gradually improved, most recent serum creatinine was 1.33 about 5 weeks ago. Monitor basic metabolic panel today. He will be having nephrology follow-up pretty soon. Of note, he says a new medication was added at his most recent nephrology visit.  He does not recall the name.  Will try to get the record and/or he will call back with the name and dose.   #4 CHF.  Suspect stress cardiomyopathy in the setting of sepsis. CHF clinic managing. Volume status has improved significantly.  Weight is stable around 290-294 lbs on our scale. Continue torsemide  40 mg twice a day. He is off Lopressor . He has metolazone  2.5 mg tabs to use as needed.  Farxiga  was prescribed but he is unable to afford this. A follow-up echocardiogram is scheduled for 08/17/2023. Checking electrolytes and creatinine today.   5.  A-fib, history of DCCV. He is maintaining sinus rhythm. He is on amiodarone  and Eliquis . Monitor TSH today.  INTERIM HX: Feeling well, no acute concerns.  Glucoses ranging from the 80s to the 350 mark.  Almost always shoots up postprandially.  He chases his hyperglycemia much of the time but also tries to anticipate by giving a little bit more Humalog at meals. He has also been increasing his degludec insulin  and is currently taking 46 units in the morning and 36 units in the evening. No significant hypoglycemic  events.  He denies shortness of breath, palpitations, chest pain, or any change in lower extremity edema.  He continues to take his cardiac meds: Amiodarone  200 mg a day, Eliquis  5 mg twice a day, torsemide  40 mg twice a day.  He has nitroglycerin  and Zaroxolyn  on hand for as needed use.  No home blood pressure monitoring.  Review of systems: See HPI above.  Past Medical History:  Diagnosis Date   B12 deficiency    Chronic low back pain    Dr. Orlando is pain mgmt MD   Depression    Diabetes mellitus with complication (HCC)    DPN + microalbuminuria   GERD (gastroesophageal reflux disease)    History of acute renal failure    Required hemodialysis in hospital for cardiogenic shock 05/2023   History of adenomatous polyp of colon 05/22/2021   recall 7 yrs   Hypercholesterolemia    Statin intolerant.  Started Repatha  10/2021   Hypertension    Nonischemic cardiomyopathy (HCC)    Acute heart failure due to sepsis/shock.  EF 40 to 45% 05/2023.   OSA (obstructive sleep apnea)    on nighttime home O2 (refuses to wear CPAP because of claustrophobia)   PAF (paroxysmal atrial fibrillation) (HCC)    Initial diagnosis during hospitalization for shock 05/2023.  DCCV successful in hospital.  Discharged home on amiodarone  and Eliquis .   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 + chronic lumbar radiculopathy   Persistent asthma    Never smoker   Seizures (HCC)  Statin intolerance    myalgias   Subclinical hypothyroidism 06/2017    Past Surgical History:  Procedure Laterality Date   COLONOSCOPY  05/22/2021   Adenomas--recall 7 years   CYSTOSCOPY WITH RETROGRADE URETHROGRAM N/A 08/21/2021   Procedure: RETROGRADE URETHROGRAM;  Surgeon: Cam Morene ORN, MD;  Location: [facility];  Service: Urology;  Laterality: N/A;   CYSTOSCOPY WITH URETHRAL DILATATION N/A 08/21/2021   Procedure: CYSTOSCOPY WITH URETHRAL DILATATION OPTILUME;  Surgeon: Cam Morene ORN, MD;  Location: [facility];  Service:  Urology;  Laterality: N/A;   IR FLUORO GUIDE CV LINE RIGHT  05/19/2023   IR REMOVAL TUN CV CATH W/O FL  06/03/2023   IR US  GUIDE VASC ACCESS RIGHT  05/19/2023   LUMBAR SPINE SURGERY     lumbar laminectomy with hardware stabilization, with ensuing arachnoiditis   RIGHT HEART CATH N/A 05/13/2023   normal R and L heart pressures.  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    Procedure: RIGHT HEART CATH;  Surgeon: Gardenia Led, DO;  Location: [facility];  Service: Cardiovascular;  Laterality: N/A;   TRANSTHORACIC ECHOCARDIOGRAM     05/12/23 EF 40-45%, regional WMA, valves good.   URETHRAL STRICTURE DILATATION  1996 and 2004.   Laser surgery.    Outpatient Medications Prior to Visit  Medication Sig Dispense Refill   Acetaminophen  (TYLENOL  PO) Take 500 mg by mouth daily as needed (pain).     albuterol  (VENTOLIN  HFA) 108 (90 Base) MCG/ACT inhaler INHALE 1-2 PUFFS BY MOUTH EVERY 6 HOURS AS NEEDED FOR WHEEZE OR SHORTNESS OF BREATH 6.7 each 2   amiodarone  (PACERONE ) 200 MG tablet TAKE 1 TABLET BY MOUTH EVERY DAY 30 tablet 2   apixaban  (ELIQUIS ) 5 MG TABS tablet Take 1 tablet (5 mg total) by mouth 2 (two) times daily. 180 tablet 3   B-D ULTRAFINE III SHORT PEN 31G X 8 MM MISC USE AS DIRECTED TWICE DAILY FOR MEDICATION INJECTION 100 each 1   blood glucose meter kit and supplies KIT Dispense based on patient and insurance preference. Use to check glucose 4 times daily. 1 each 0   cetirizine  (ZYRTEC ) 10 MG tablet TAKE 1 TABLET BY MOUTH DAILY AS NEEDED FOR ITCHING 90 tablet 1   Continuous Glucose Receiver (FREESTYLE LIBRE 3 READER) DEVI Use to check glucose continuously 2 each 3   Continuous Glucose Sensor (FREESTYLE LIBRE 3 SENSOR) MISC PLACE 1 SENSOR ON THE SKIN EVERY 14 DAYS. USE TO CHECK GLUCOSE CONTINUOUSLY 2 each 3   dapagliflozin  propanediol (FARXIGA ) 10 MG TABS tablet Take 1 tablet (10 mg total) by mouth daily  before breakfast. 90 tablet 3   esomeprazole  (NEXIUM ) 40 MG capsule Take 1 capsule (40 mg total) by mouth daily. 90 capsule 1   Evolocumab  (REPATHA  SURECLICK) 140 MG/ML SOAJ INJECT 1 DOSE INTO THE SKIN EVERY 14 (FOURTEEN) DAYS. 6 mL 1   FREESTYLE TEST STRIPS test strip USE TO CHECK GLUCOSE 4 TIMES DAILY. 200 strip 4   insulin  aspart (NOVOLOG  FLEXPEN) 100 UNIT/ML FlexPen USE 10 UNITS SUBCUTANEOUSLY WITH BREAKFAST AND LUNCH AND USE 5 UNITS AT SUPPER. 15 mL 0   Insulin  Degludec (TRESIBA  [place]) Inject 36 Units into the skin in the morning. 26 units in the evening (Patient taking differently: Inject 46 Units into the skin in the morning. 36 units in the evening)     methocarbamol  (ROBAXIN ) 500 MG tablet Take 500 mg by mouth 2 (two) times  daily as needed for muscle spasms.     methocarbamol  (ROBAXIN ) 500 MG tablet Take 1 tablet (500 mg total) by mouth 2 (two) times daily as needed. 60 tablet 1   metolazone  (ZAROXOLYN ) 2.5 MG tablet Take 1 tablet (2.5 mg total) by mouth as directed. Only take after you have spoken to the [facility] 15 tablet 2   morphine  (MS CONTIN ) 30 MG 12 hr tablet Take 30-60 mg by mouth See admin instructions. Taking 60 mg in the AM and 30 mg in the afternoon and 60 mg at bedtime     morphine  (MS CONTIN ) 30 MG 12 hr tablet Take 2 tablets (60 mg total) by mouth every morning AND 1 tablet (30 mg total) daily midday AND 2 tablets (60 mg total) every evening. Space doses 8 hours apart. 150 tablet 0   [START ON 11/22/2023] morphine  (MS CONTIN ) 30 MG 12 hr tablet Take 1-2 tablets (30-60 mg total) by mouth every 8 (eight) hours; 2 tablets in the morning, 1 tablet at midday and 2 tablets in the afternoon 11/22/23 150 tablet 0   morphine  (MS CONTIN ) 30 MG 12 hr tablet Take 1-2 tablets (30-60 mg total) by mouth every 8 (eight) hours; 2 tablets in the morning, 1 tablet at midday and 2 tablets in the evening 150 tablet 0   Multiple Vitamin (MULTIVITAMIN) tablet Take 1 tablet by mouth daily.      nitroGLYCERIN  (NITROSTAT ) 0.4 MG SL tablet Place 1 tablet (0.4 mg total) under the tongue every 5 (five) minutes as needed for chest pain. 25 tablet 4   torsemide  (DEMADEX ) 20 MG tablet TAKE 2 TABLETS BY MOUTH 2 TIMES DAILY. 360 tablet 1   No facility-administered medications prior to visit.    Allergies  Allergen Reactions   Ace Inhibitors     Kidney failure   Atorvastatin  Other (See Comments)    Arm pain   Prednisone     REACTION: Anxiety   Septra  [Sulfamethoxazole -Trimethoprim ]    Metformin  And Related Diarrhea    Review of Systems As per HPI  PE:    11/04/2023    3:22 PM 10/13/2023    7:32 PM 08/17/2023    2:01 PM  Vitals with BMI  Height 6' 0 6' 0   Weight 308 lbs 300 lbs 294 lbs 13 oz  BMI 41.76 40.68   Systolic 113  118  Diastolic 63  70  Pulse 90  82     Physical Exam  Gen: Alert, well appearing.  Patient is oriented to person, place, time, and situation. AFFECT: pleasant, lucid thought and speech. No further exam today.  LABS:  Last CBC Lab Results  Component Value Date   WBC 10.1 06/23/2023   HGB 12.7 (L) 06/23/2023   HCT 39.1 06/23/2023   MCV 88.7 06/23/2023   MCH 27.3 05/28/2023   RDW 18.5 (H) 06/23/2023   PLT 269.0 06/23/2023   Last metabolic panel Lab Results  Component Value Date   GLUCOSE 179 (H) 08/04/2023   NA 142 08/04/2023   K 5.2 No hemolysis seen (H) 08/04/2023   CL 100 08/04/2023   CO2 32 08/04/2023   BUN 37 (H) 08/04/2023   CREATININE 1.47 08/04/2023   GFR 52.71 (L) 08/04/2023   CALCIUM  9.5 08/04/2023   PHOS 4.2 05/29/2023   PROT 7.6 06/01/2023   ALBUMIN  3.3 (L) 06/01/2023   BILITOT 1.0 06/01/2023   ALKPHOS 65 06/01/2023   AST 19 06/01/2023   ALT 19 06/01/2023   ANIONGAP  9 06/27/2023   Last lipids Lab Results  Component Value Date   CHOL 120 05/07/2023   HDL 21 (L) 05/07/2023   LDLCALC 84 05/07/2023   LDLDIRECT 166.2 11/09/2013   TRIG 200 (H) 05/12/2023   CHOLHDL 5.7 05/07/2023   Last hemoglobin A1c Lab  Results  Component Value Date   HGBA1C 8.0 (A) 11/04/2023   HGBA1C 8.0 11/04/2023   HGBA1C 8.0 (A) 11/04/2023   HGBA1C 8.0 (A) 11/04/2023   Last thyroid  functions Lab Results  Component Value Date   TSH 2.63 08/04/2023   T3TOTAL 127 10/18/2019   IMPRESSION AND PLAN:  1 diabetes with neuropathy. Erratic control, hemoglobin A1c increased from 7.4% to 8.0%. He has made good attempts at adjustment of insulin  dosing. He will continue to gradually increase degludec and NovoLog . Checking electrolytes and creatinine today.  #2 hypertension, normal blood pressure without antihypertensive lately.   #3 CHF.  Suspect stress cardiomyopathy in the setting of sepsis. CHF clinic managing. Continue torsemide  40 mg twice a day. He has metolazone  2.5 mg tabs to use as needed.  Farxiga  was prescribed but he is unable to afford this. 08/17/2023 echo showed ejection fraction slightly improved to 45 to 50%. Checking electrolytes and creatinine today.   5.  A-fib, history of DCCV. He is maintaining sinus rhythm. He is on amiodarone  and Eliquis . Monitor TSH at next follow-up in 3 months.  An After Visit Summary was printed and given to the patient.  FOLLOW UP: 3 mo  Signed:  Gerlene Hockey, MD           11/04/2023

## 2023-11-05 ENCOUNTER — Encounter: Payer: Self-pay | Admitting: Family Medicine

## 2023-11-05 LAB — COMPREHENSIVE METABOLIC PANEL WITH GFR
AG Ratio: 1.3 (calc) (ref 1.0–2.5)
ALT: 14 U/L (ref 9–46)
AST: 16 U/L (ref 10–35)
Albumin: 3.9 g/dL (ref 3.6–5.1)
Alkaline phosphatase (APISO): 64 U/L (ref 35–144)
BUN/Creatinine Ratio: 21 (calc) (ref 6–22)
BUN: 61 mg/dL — ABNORMAL HIGH (ref 7–25)
CO2: 26 mmol/L (ref 20–32)
Calcium: 8.9 mg/dL (ref 8.6–10.3)
Chloride: 99 mmol/L (ref 98–110)
Creat: 2.91 mg/dL — ABNORMAL HIGH (ref 0.70–1.30)
Globulin: 3.1 g/dL (ref 1.9–3.7)
Glucose, Bld: 160 mg/dL — ABNORMAL HIGH (ref 65–99)
Potassium: 4.5 mmol/L (ref 3.5–5.3)
Sodium: 138 mmol/L (ref 135–146)
Total Bilirubin: 0.7 mg/dL (ref 0.2–1.2)
Total Protein: 7 g/dL (ref 6.1–8.1)
eGFR: 24 mL/min/1.73m2 — ABNORMAL LOW (ref >=60)

## 2023-11-06 ENCOUNTER — Ambulatory Visit: Payer: Self-pay | Admitting: Family Medicine

## 2023-11-06 ENCOUNTER — Encounter: Payer: Self-pay | Admitting: Family Medicine

## 2023-11-08 ENCOUNTER — Telehealth: Payer: Self-pay

## 2023-11-08 NOTE — Telephone Encounter (Signed)
 FYI. Please see below.  Copied from CRM #[id number]. Topic: Clinical - Lab/Test Results >> Nov 08, 2023 10:05 AM [place] E wrote: Reason for CRM: Patient called back today regarding missed phone calls, relayed the note that PCP attached with instructions for medications, patient relayed understanding and stated that he is not taking his Torsemide  or Metolazone . Patient stated he has an appointment with his kidney doctor on September 10th. Patient has lab appointment on 8/8.

## 2023-11-08 NOTE — Telephone Encounter (Signed)
 Noted

## 2023-11-11 ENCOUNTER — Other Ambulatory Visit

## 2023-11-11 DIAGNOSIS — N179 Acute kidney failure, unspecified: Secondary | ICD-10-CM

## 2023-11-11 LAB — BASIC METABOLIC PANEL WITH GFR
BUN: 20 mg/dL (ref 6–23)
CO2: 28 meq/L (ref 19–32)
Calcium: 8.3 mg/dL — ABNORMAL LOW (ref 8.4–10.5)
Chloride: 103 meq/L (ref 96–112)
Creatinine, Ser: 1.57 mg/dL — ABNORMAL HIGH (ref 0.40–1.50)
GFR: 48.62 mL/min — ABNORMAL LOW (ref >60.00)
Glucose, Bld: 102 mg/dL — ABNORMAL HIGH (ref 70–99)
Potassium: 5 meq/L (ref 3.5–5.1)
Sodium: 141 meq/L (ref 135–145)

## 2023-11-12 ENCOUNTER — Ambulatory Visit: Payer: Self-pay | Admitting: Family Medicine

## 2023-11-20 ENCOUNTER — Other Ambulatory Visit: Payer: Self-pay | Admitting: Family Medicine

## 2023-11-27 ENCOUNTER — Other Ambulatory Visit: Payer: Self-pay | Admitting: Family Medicine

## 2023-11-27 DIAGNOSIS — E118 Type 2 diabetes mellitus with unspecified complications: Secondary | ICD-10-CM

## 2023-11-30 ENCOUNTER — Other Ambulatory Visit: Payer: Self-pay | Admitting: Family Medicine

## 2023-12-01 ENCOUNTER — Other Ambulatory Visit (HOSPITAL_COMMUNITY): Payer: Self-pay

## 2023-12-01 ENCOUNTER — Other Ambulatory Visit: Payer: Self-pay

## 2023-12-13 ENCOUNTER — Ambulatory Visit (INDEPENDENT_AMBULATORY_CARE_PROVIDER_SITE_OTHER): Admitting: Primary Care

## 2023-12-13 ENCOUNTER — Telehealth: Payer: Self-pay | Admitting: *Deleted

## 2023-12-13 ENCOUNTER — Encounter: Payer: Self-pay | Admitting: Primary Care

## 2023-12-13 VITALS — BP 144/72 | HR 102 | Temp 97.6°F | Ht 73.0 in | Wt 319.8 lb

## 2023-12-13 DIAGNOSIS — G4733 Obstructive sleep apnea (adult) (pediatric): Secondary | ICD-10-CM | POA: Diagnosis not present

## 2023-12-13 DIAGNOSIS — J9601 Acute respiratory failure with hypoxia: Secondary | ICD-10-CM

## 2023-12-13 NOTE — Telephone Encounter (Signed)
The patient has been notified of the result. Left detailed message on voicemail and informed patient to call back.

## 2023-12-13 NOTE — Patient Instructions (Addendum)
  VISIT SUMMARY: You came in today for a follow-up after your recent sleep study. The study confirmed that you have moderate obstructive sleep apnea with nocturnal hypoxia. We discussed your difficulties with using CPAP or BiPAP machines and explored alternative treatments. We also reviewed your weight management, diabetes, and congestive heart failure.  YOUR PLAN: -OBSTRUCTIVE SLEEP APNEA WITH NOCTURNAL HYPOXIA: You have moderate obstructive sleep apnea, which means your breathing stops and starts during sleep, leading to low oxygen levels at night. We will refer you to an Ear, Nose, and Throat specialist to evaluate if you are a candidate for an Inspire implant, which is a device that can help manage your sleep apnea. To qualify for this, you need to lose weight to achieve a BMI under 40. In the meantime, try to sleep with your head elevated and use a fan for comfort.  -OBESITY, BMI 42: Your current BMI is 42, which classifies you as obese. Losing weight is important not only for your overall health but also to qualify for the [facility] implant for your sleep apnea. Aim to lose 20 pounds to bring your BMI under 40.   -TYPE 2 DIABETES MELLITUS, INSULIN  DEPENDENT: You have type 2 diabetes and are currently managing it with 60 units of insulin  in the morning and at night. Losing weight can also help improve your diabetes management. Please discuss potential weight loss (GLP medication) with your nephrologist to help you achieve this goal due to diabetes.  -CONGESTIVE HEART FAILURE WITH LOWER EXTREMITY EDEMA: You have congestive heart failure, which means your heart doesn't pump blood as well as it should, leading to fluid buildup in your legs. You are managing this with torsemide , a medication you take as needed based on weight gain and swelling. Continue to stay off torsemide  for now but you can take a 40 mg dose if you gain 3 pounds in a day or you start to feel any shortness of breath  INSTRUCTIONS: We  will refer you to an Ear, Nose, and Throat specialist to evaluate your candidacy for the Inspire implant. Please aim to lose 20 pounds to help qualify for this treatment. We will also discuss potential weight loss medications with your nephrologist to assist in your weight and diabetes management. Continue to use torsemide  as needed based on your symptoms.  Refer ENT for inspire evaluation / BMI needs to be <40 (aim for 300 lbs or less)  Follow-up 4-6 months with [facility] NP or sooner if needed

## 2023-12-13 NOTE — Progress Notes (Signed)
 @Patient  ID: Dylan Owen, male    DOB: Sep 19, 1965, 59 y.o.   MRN: [id number]  Chief Complaint  Patient presents with   Medical Management of Chronic Issues    Respiratory failure, Polysomnography results     Referring provider: Candise Aleene DEL, MD  HPI: 58 year old male, never smoked. PMH significant for CHF, afib, HTN, acute respiratory failure, allergic rhinitis, multifocal pneumonia, GERD, type 2 diabetes, hyperlipidemia, tobacco.   Previous [facility] pulmonary encounter:  07/28/2023 Discussed the use of AI scribe software for clinical note transcription with the patient, who gave verbal consent to proceed.  History of Present Illness   Dylan Owen is a 58 year old male with congestive heart failure who presents for a pulmonary consultation following a recent hospitalization.   He was hospitalized from January 29 to February 23 for congestive heart failure, during which he experienced acute kidney injury and required intubation. He was weaned to BiPAP and oxygen before being discharged on room air.  He has a history of sleep apnea and has undergone three sleep studies in the past, though the details are not in the current chart. He experiences frequent awakenings at night due to diuretic use, taking two tablets in the morning and two at night, which causes him to wake up hourly to urinate. He has reduced his nighttime dose to prevent accidents during sleep.  No current respiratory symptoms such as shortness of breath, wheezing, or productive cough, though he occasionally experiences wheezing. He has never smoked but has chewed tobacco since age 7. He has a history of waking up gasping for air, which was more frequent when he was on oxygen.  His sleep routine includes going to bed around 9 or 10 PM, taking up to two hours to fall asleep, and waking up around 9 to 11 AM. He sometimes naps in a recliner for 30 minutes to an hour, especially after taking morphine  in the morning.  He experiences moderate chances of dozing off when sitting still or watching TV, but not in public places or while talking to someone.   12/13/2023 Discussed the use of AI scribe software for clinical note transcription with the patient, who gave verbal consent to proceed.  History of Present Illness Dylan Owen is a 58 year old male with moderate obstructive sleep apnea who presents for follow-up after a sleep study.  He underwent a sleep study on July 10th, which revealed moderate obstructive sleep apnea with nocturnal hypoxia. Time spent with SpO2 <88% was 9 minutes. He has had multiple sleep studies in the past at different locations. He experiences difficulty tolerating BiPAP due to discomfort and a dislike of having anything on his head, stemming from a childhood experience. He does not currently use any CPAP or BiPAP machine.  In January, he was admitted for respiratory failure requiring intubation and BiPAP, which he found difficult to tolerate. He has a history of significant weight fluctuations, having lost weight after a hospital stay but regaining some. His current weight is 319 pounds, with a BMI of 42. He uses a fan at night and sleeps on his back and side; he previously slept in a recliner when he was on regular oxygen. No episodes of waking up gasping or choking.  He has a history of congestive heart failure and is on torsemide , which he takes as needed based on symptoms like swelling. His breathing is generally okay, though he occasionally experiences a 'rolling sound' in his chest, which he  attributes to sinus issues. He takes sinus medication to manage these symptoms.  He is diabetic and administers 60 units of insulin  in the morning and at night. He is trying to manage his weight, aiming to reduce it to below 250 pounds, but experiences fluctuations.  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  Allergies  Allergen Reactions   Ace Inhibitors     Kidney failure   Atorvastatin  Other (See Comments)    Arm pain   Prednisone     REACTION: Anxiety   Septra  [Sulfamethoxazole -Trimethoprim ]    Metformin  And Related Diarrhea    Immunization History  Administered Date(s) Administered   Influenza Inj Mdck Quad Pf 01/02/2018   Influenza Split 02/08/2012   Influenza Whole 02/08/2008, 01/02/2009, 01/28/2010   Influenza,inj,Quad PF,6+ Mos 12/19/2013, 03/04/2016, 06/07/2017   Influenza-Unspecified 03/07/2013, 02/03/2018, 03/22/2021   PFIZER(Purple Top)SARS-COV-2 Vaccination 08/28/2019, 09/14/2019   PNEUMOCOCCAL CONJUGATE-20 09/12/2020   Pneumococcal Polysaccharide-23 09/27/2008, 03/04/2016   Tdap 12/19/2013   Zoster Recombinant(Shingrix) 09/12/2020, 12/25/2020    Past Medical History:  Diagnosis Date   B12 deficiency    Chronic low back pain    Dr. Orlando is pain mgmt MD   Depression    Diabetes mellitus with complication (HCC)    DPN + microalbuminuria   GERD (gastroesophageal reflux disease)    History of acute renal failure    Required hemodialysis in hospital for cardiogenic shock 05/2023   History of adenomatous polyp of colon 05/22/2021   recall 7 yrs   Hypercholesterolemia    Statin intolerant.  Started Repatha  10/2021   Hypertension    Nonischemic cardiomyopathy (HCC)    Acute heart failure due to sepsis/shock.  EF 40 to 45% 05/2023.   OSA (obstructive sleep apnea)    on nighttime home O2 (hx of refusal to wear CPAP because of claustrophobia)   PAF (paroxysmal atrial fibrillation) (HCC)    Initial diagnosis during hospitalization for shock 05/2023.  DCCV successful in hospital.  Discharged home on amiodarone  and Eliquis .   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     albuterol  (VENTOLIN  HFA) 108 (90 Base) MCG/ACT inhaler INHALE 1-2 PUFFS BY MOUTH EVERY 6 HOURS AS NEEDED FOR WHEEZE OR SHORTNESS OF BREATH 6.7 each 2   amiodarone  (PACERONE ) 200 MG tablet TAKE 1 TABLET BY MOUTH EVERY DAY 30 tablet 2   apixaban  (ELIQUIS ) 5 MG TABS tablet Take 1 tablet (5 mg total) by mouth 2 (two) times daily. 180 tablet 3   B-D ULTRAFINE III SHORT PEN 31G X 8 MM MISC USE AS DIRECTED TWICE DAILY FOR MEDICATION INJECTION 100 each 1   blood glucose meter kit and supplies KIT Dispense based on patient and insurance preference. Use to check glucose 4 times daily. 1 each 0   cetirizine  (ZYRTEC ) 10 MG tablet TAKE 1 TABLET BY MOUTH DAILY  AS NEEDED FOR ITCHING 90 tablet 1   Continuous Glucose Receiver (FREESTYLE LIBRE 3 READER) DEVI Use to check glucose continuously 2 each 3   Continuous Glucose Sensor (FREESTYLE LIBRE 3 SENSOR) MISC PLACE 1 SENSOR ON THE SKIN EVERY 14 DAYS. USE TO CHECK GLUCOSE CONTINUOUSLY 2 each 3   dapagliflozin  propanediol (FARXIGA ) 10 MG TABS tablet Take 1 tablet (10 mg total) by mouth daily before breakfast. 90 tablet 3   esomeprazole  (NEXIUM ) 40 MG capsule Take 1 capsule (40 mg total) by mouth daily. 90 capsule 1   Evolocumab  (REPATHA  SURECLICK) 140 MG/ML SOAJ INJECT 1 DOSE INTO THE SKIN EVERY 14 (FOURTEEN) DAYS. 6 mL 1   FREESTYLE TEST STRIPS test strip USE TO CHECK GLUCOSE 4 TIMES DAILY. 200 strip 4   insulin  aspart (NOVOLOG  FLEXPEN) 100 UNIT/ML FlexPen USE 10 UNITS SUBCUTANEOUSLY WITH BREAKFAST AND LUNCH AND USE 5 UNITS AT SUPPER. 15 mL 3   Insulin  Degludec (TRESIBA  [place]) Inject 36 Units into the skin in the morning.  26 units in the evening (Patient taking differently: Inject 46 Units into the skin in the morning. 36 units in the evening)     methocarbamol  (ROBAXIN ) 500 MG tablet Take 500 mg by mouth 2 (two) times daily as needed for muscle spasms.     methocarbamol  (ROBAXIN ) 500 MG tablet Take 1 tablet (500 mg total) by mouth 2 (two) times daily as needed. 60 tablet 1   metolazone  (ZAROXOLYN ) 2.5 MG tablet Take 1 tablet (2.5 mg total) by mouth as directed. Only take after you have spoken to the [facility] 15 tablet 2   morphine  (MS CONTIN ) 30 MG 12 hr tablet Take 30-60 mg by mouth See admin instructions. Taking 60 mg in the AM and 30 mg in the afternoon and 60 mg at bedtime     morphine  (MS CONTIN ) 30 MG 12 hr tablet Take 2 tablets (60 mg total) by mouth every morning AND 1 tablet (30 mg total) daily midday AND 2 tablets (60 mg total) every evening. Space doses 8 hours apart. 150 tablet 0   morphine  (MS CONTIN ) 30 MG 12 hr tablet Take 1-2 tablets (30-60 mg total) by mouth every 8 (eight) hours; 2 tablets in the morning, 1 tablet at midday and 2 tablets in the afternoon 11/22/23 150 tablet 0   morphine  (MS CONTIN ) 30 MG 12 hr tablet Take 1-2 tablets (30-60 mg total) by mouth every 8 (eight) hours; 2 tablets in the morning, 1 tablet at midday and 2 tablets in the evening 150 tablet 0   Multiple Vitamin (MULTIVITAMIN) tablet Take 1 tablet by mouth daily.     mupirocin  ointment (BACTROBAN ) 2 % Apply 1 Application topically 3 (three) times daily. 15 g 0   nitroGLYCERIN  (NITROSTAT ) 0.4 MG SL tablet Place 1 tablet (0.4 mg total) under the tongue every 5 (five) minutes as needed for chest pain. 25 tablet 4   olmesartan (BENICAR) 20 MG tablet Take 10 mg by mouth daily.     torsemide  (DEMADEX ) 20 MG tablet TAKE 2 TABLETS BY MOUTH 2 TIMES DAILY. 360 tablet 1   No facility-administered medications prior to visit.   Review of Systems  Review of Systems  Constitutional: Negative.  Negative for fatigue and  unexpected weight change.  Respiratory:  Negative for cough, shortness of breath and wheezing.   Psychiatric/Behavioral:  Positive for sleep disturbance.    Physical Exam  BP (!) 144/72   Pulse (!) 102   Temp 97.6 F (36.4  C)   Ht 6' 1 (1.854 m)   Wt (!) 319 lb 12.8 oz (145.1 kg)   SpO2 97% Comment: RA  BMI 42.19 kg/m  Physical Exam Constitutional:      General: He is not in acute distress.    Appearance: Normal appearance. He is obese. He is not ill-appearing.  HENT:     Head: Normocephalic and atraumatic.  Cardiovascular:     Rate and Rhythm: Normal rate and regular rhythm.     Comments: Trace BLE edema Pulmonary:     Effort: Pulmonary effort is normal.     Breath sounds: Normal breath sounds. No wheezing or rhonchi.  Musculoskeletal:        General: Normal range of motion.  Skin:    General: Skin is warm and dry.  Neurological:     General: No focal deficit present.     Mental Status: He is alert and oriented to person, place, and time. Mental status is at baseline.  Psychiatric:        Mood and Affect: Mood normal.        Behavior: Behavior normal.        Thought Content: Thought content normal.        Judgment: Judgment normal.     Lab Results:  CBC    Component Value Date/Time   WBC 10.1 06/23/2023 1525   RBC 4.41 06/23/2023 1525   HGB 12.7 (L) 06/23/2023 1525   HCT 39.1 06/23/2023 1525   PLT 269.0 06/23/2023 1525   MCV 88.7 06/23/2023 1525   MCH 27.3 05/28/2023 0311   MCHC 32.5 06/23/2023 1525   RDW 18.5 (H) 06/23/2023 1525   LYMPHSABS 1.6 06/01/2023 1515   MONOABS 1.6 (H) 06/01/2023 1515   EOSABS 1.1 (H) 06/01/2023 1515   BASOSABS 0.2 (H) 06/01/2023 1515    BMET    Component Value Date/Time   NA 141 11/11/2023 1050   K 5.0 11/11/2023 1050   CL 103 11/11/2023 1050   CO2 28 11/11/2023 1050   GLUCOSE 102 (H) 11/11/2023 1050   BUN 20 11/11/2023 1050   CREATININE 1.57 (H) 11/11/2023 1050   CREATININE 2.91 (H) 11/04/2023 1551   CALCIUM  8.3  (L) 11/11/2023 1050   GFRNONAA >60 06/27/2023 1341   GFRNONAA >60 09/04/2010 0900   GFRAA >90 10/09/2011 1530   GFRAA >60 09/04/2010 0900    BNP    Component Value Date/Time   BNP 951.1 (H) 06/16/2023 1536   BNP 32.2 10/01/2011 1432    ProBNP No results found for: PROBNP  Imaging: No results found.   Assessment & Plan:   1. SLEEP APNEA, OBSTRUCTIVE (Primary) - Ambulatory referral to ENT  Assessment and Plan Assessment & Plan Obstructive sleep apnea with nocturnal hypoxia Moderate obstructive sleep apnea with nocturnal hypoxia confirmed by sleep study in July, showing 16 apneic events per hour. Time spent with SpO2 <88% was 9 minutes. Intolerance to CPAP due to discomfort and aversion to mask Not a candidate for oral appliance due to oral dentition. Inspire implant considered but requires BMI under 40 for insurance approval. - Refer to Ear, Nose, and Throat for evaluation of Inspire candidacy - Advise weight loss to achieve BMI under 40 for potential Inspire implant - Advise sleeping with head elevated and using a fan for comfort  Obesity, BMI 42 Current BMI is 42, with a weight of 319 pounds. Weight loss is necessary to qualify for Inspire implant for sleep apnea management. Need to reduce weight to 300  pounds to achieve a BMI of 39.6 for insurance approval for Inspire. - Advise weight loss of 20 pounds to reach BMI under 40  Type 2 diabetes mellitus, insulin  dependent Insulin -dependent diabetes managed with 60 units of insulin  in the morning and 60 units at night. Weight loss could benefit diabetes management. - Discuss GLP weight loss medication with nephrologist to aid in weight reduction and diabetes management  Congestive heart failure with lower extremity edema Congestive heart failure with lower extremity edema managed with torsemide  as needed based on weight gain and edema. Instructed to hold torsemide  by cardiology unless there is a weight gain of 3 pounds in a  day or noticeable edema. - Continue torsemide  as needed based on weight gain and edema   Almarie LELON Ferrari, NP 12/13/2023

## 2023-12-13 NOTE — Telephone Encounter (Signed)
-----   Message from Wilbert Bihari sent at 10/25/2023 10:20 AM EDT ----- Please let patient know that they have sleep apnea.  Recommend therapeutic CPAP titration for treatment of patient's sleep disordered breathing.

## 2023-12-20 ENCOUNTER — Other Ambulatory Visit (HOSPITAL_COMMUNITY): Payer: Self-pay

## 2023-12-20 DIAGNOSIS — E1142 Type 2 diabetes mellitus with diabetic polyneuropathy: Secondary | ICD-10-CM | POA: Diagnosis not present

## 2023-12-20 DIAGNOSIS — G894 Chronic pain syndrome: Secondary | ICD-10-CM | POA: Diagnosis not present

## 2023-12-20 DIAGNOSIS — Z79891 Long term (current) use of opiate analgesic: Secondary | ICD-10-CM | POA: Diagnosis not present

## 2023-12-20 DIAGNOSIS — M961 Postlaminectomy syndrome, not elsewhere classified: Secondary | ICD-10-CM | POA: Diagnosis not present

## 2023-12-20 MED ORDER — METHOCARBAMOL 500 MG PO TABS
500.0000 mg | ORAL_TABLET | Freq: Two times a day (BID) | ORAL | 1 refills | Status: DC | PRN
  Filled 2023-12-20 – 2023-12-30 (×2): qty 60, 30d supply, fill #0
  Filled 2024-02-09: qty 60, 30d supply, fill #1

## 2023-12-20 MED ORDER — ESOMEPRAZOLE MAGNESIUM 40 MG PO CPDR
40.0000 mg | DELAYED_RELEASE_CAPSULE | Freq: Every day | ORAL | 1 refills | Status: AC
  Filled 2023-12-20 – 2024-02-07 (×2): qty 90, 90d supply, fill #0

## 2023-12-20 MED ORDER — MORPHINE SULFATE ER 30 MG PO TBCR
EXTENDED_RELEASE_TABLET | ORAL | 0 refills | Status: AC
  Filled 2024-02-09: qty 150, 30d supply, fill #0

## 2023-12-20 MED ORDER — MORPHINE SULFATE ER 30 MG PO TBCR
EXTENDED_RELEASE_TABLET | ORAL | 0 refills | Status: AC
  Filled 2023-12-30: qty 150, 30d supply, fill #0

## 2023-12-21 ENCOUNTER — Other Ambulatory Visit (HOSPITAL_COMMUNITY): Payer: Self-pay

## 2023-12-29 NOTE — Telephone Encounter (Signed)
 Called patient Dylan Owen to call back.

## 2023-12-30 ENCOUNTER — Other Ambulatory Visit (HOSPITAL_COMMUNITY): Payer: Self-pay

## 2024-01-03 ENCOUNTER — Encounter: Payer: Self-pay | Admitting: *Deleted

## 2024-01-04 ENCOUNTER — Other Ambulatory Visit: Payer: Self-pay | Admitting: Family Medicine

## 2024-01-26 ENCOUNTER — Institutional Professional Consult (permissible substitution) (INDEPENDENT_AMBULATORY_CARE_PROVIDER_SITE_OTHER): Admitting: Otolaryngology

## 2024-02-06 ENCOUNTER — Ambulatory Visit: Admitting: Family Medicine

## 2024-02-06 NOTE — Progress Notes (Deleted)
 Office Note 02/06/2024  CC: No chief complaint on file.  Patient is a 58 y.o. male who is here for annual health maintenance exam and 1-month follow-up diabetes, A-fib, A/P as of last visit: 1 diabetes with neuropathy. Erratic control, hemoglobin A1c increased from 7.4% to 8.0%. He has made good attempts at adjustment of insulin  dosing. He will continue to gradually increase degludec and NovoLog . Checking electrolytes and creatinine today.   #2 hypertension, normal blood pressure without antihypertensive lately.   #3 CHF.  Suspect stress cardiomyopathy in the setting of sepsis. CHF clinic managing. Continue torsemide  40 mg twice a day. He has metolazone  2.5 mg tabs to use as needed.  Farxiga  was prescribed but he is unable to afford this. 08/17/2023 echo showed ejection fraction slightly improved to 45 to 50%. Checking electrolytes and creatinine today.   4.  A-fib, history of DCCV. He is maintaining sinus rhythm. He is on amiodarone  and Eliquis . Monitor TSH at next follow-up in 3 months.  INTERIM HX: ***  Overdue for CHF clinic follow-up.   Past Medical History:  Diagnosis Date   B12 deficiency    Chronic low back pain    Dr. Orlando is pain mgmt MD   Depression    Diabetes mellitus with complication (HCC)    DPN + microalbuminuria   GERD (gastroesophageal reflux disease)    History of acute renal failure    Required hemodialysis in hospital for cardiogenic shock 05/2023   History of adenomatous polyp of colon 05/22/2021   recall 7 yrs   Hypercholesterolemia    Statin intolerant.  Started Repatha  10/2021   Hypertension    Nonischemic cardiomyopathy (HCC)    Acute heart failure due to sepsis/shock.  EF 40 to 45% 05/2023.   OSA (obstructive sleep apnea)    on nighttime home O2 (hx of refusal to wear CPAP because of claustrophobia)   PAF (paroxysmal atrial fibrillation) (HCC)    Initial diagnosis during hospitalization for shock 05/2023.  DCCV successful in  hospital.  Discharged home on amiodarone  and Eliquis .   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 + chronic lumbar radiculopathy   Persistent asthma    Never smoker   Seizures (HCC)    Statin myopathy    Subclinical hypothyroidism 06/2017    Past Surgical History:  Procedure Laterality Date   COLONOSCOPY  05/22/2021   Adenomas--recall 7 years   CYSTOSCOPY WITH RETROGRADE URETHROGRAM N/A 08/21/2021   Procedure: RETROGRADE URETHROGRAM;  Surgeon: Cam Morene ORN, MD;  Location: [facility];  Service: Urology;  Laterality: N/A;   CYSTOSCOPY WITH URETHRAL DILATATION N/A 08/21/2021   Procedure: CYSTOSCOPY WITH URETHRAL DILATATION OPTILUME;  Surgeon: Cam Morene ORN, MD;  Location: [facility];  Service: Urology;  Laterality: N/A;   IR FLUORO GUIDE CV LINE RIGHT  05/19/2023   IR REMOVAL TUN CV CATH W/O FL  06/03/2023   IR US  GUIDE VASC ACCESS RIGHT  05/19/2023   LUMBAR SPINE SURGERY     lumbar laminectomy with hardware stabilization, with ensuing arachnoiditis   RIGHT HEART CATH N/A 05/13/2023   normal R and L heart pressures.  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    Procedure: RIGHT HEART CATH;  Surgeon: Gardenia Led, DO;  Location: [facility];  Service: Cardiovascular;  Laterality: N/A;   TRANSTHORACIC ECHOCARDIOGRAM     05/12/23 EF 40-45%, regional WMA, valves good.  08/2023 EF 45-50%, RWMA, mild dec RV fxn.  URETHRAL STRICTURE DILATATION  1996 and 2004.   Laser surgery.    Family History  Problem Relation Age of Onset   Cancer Paternal Grandfather        prostate   Heart disease Other    Hypertension Other    Other Other        Emotional Illness   Colon cancer Neg Hx    Esophageal cancer Neg Hx    Rectal cancer Neg Hx    Stomach cancer Neg Hx     Social History   Socioeconomic History   Marital status: Married    Spouse name: Not on file   Number of children: Not on file   Years of  education: Not on file   Highest education level: GED or equivalent  Occupational History   Not on file  Tobacco Use   Smoking status: Never   Smokeless tobacco: Current    Types: Chew   Tobacco comments:    dip  Vaping Use   Vaping status: Never Used  Substance and Sexual Activity   Alcohol use: No   Drug use: No   Sexual activity: Yes  Other Topics Concern   Not on file  Social History Narrative   Married, 4 children.   Occupation: disabled since 2000.     Prior to disability he worked for [employer].   Orig from [place].   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  No hx of drug abuse.   Chews tobacco since age 72 yrs.   Regular exercise:  No               Social Drivers of Corporate Investment Banker Strain: High Risk (12/05/2022)   Overall Financial Resource Strain (CARDIA)    Difficulty of Paying Living Expenses: Very hard  Food Insecurity: No Food Insecurity (05/05/2023)   Hunger Vital Sign    Worried About Running Out of Food in the Last Year: Never true    Ran Out of Food in the Last Year: Never true  Recent Concern: Food Insecurity - Food Insecurity Present (05/05/2023)   Hunger Vital Sign    Worried About Running Out of Food in the Last Year: Never true    Ran Out of Food in the Last Year: Sometimes true  Transportation Needs: No Transportation Needs (05/05/2023)   PRAPARE - Administrator, Civil Service (Medical): No    Lack of Transportation (Non-Medical): No  Physical Activity: Unknown (12/05/2022)   Exercise Vital Sign    Days of Exercise per Week: 0 days    Minutes of Exercise per Session: Not on file  Stress: Stress Concern Present (12/05/2022)   [employer] of Occupational Health - Occupational Stress Questionnaire    Feeling of Stress : To some extent  Social Connections: Moderately Isolated (05/05/2023)   Social Connection and Isolation Panel    Frequency of  Communication with Friends and Family: Three times a week    Frequency of Social Gatherings with Friends and Family: Never    Attends Religious Services: Never    Database Administrator or Organizations: No    Attends Banker Meetings: Never    Marital Status: Married  Catering Manager Violence: Not At Risk (05/05/2023)   Humiliation, Afraid, Rape, and Kick questionnaire    Fear of Current or Ex-Partner: No    Emotionally Abused: No    Physically Abused:  No    Sexually Abused: No    Outpatient Medications Prior to Visit  Medication Sig Dispense Refill   Acetaminophen  (TYLENOL  PO) Take 500 mg by mouth daily as needed (pain).     albuterol  (VENTOLIN  HFA) 108 (90 Base) MCG/ACT inhaler INHALE 1-2 PUFFS BY MOUTH EVERY 6 HOURS AS NEEDED FOR WHEEZE OR SHORTNESS OF BREATH 6.7 each 2   amiodarone  (PACERONE ) 200 MG tablet TAKE 1 TABLET BY MOUTH EVERY DAY 30 tablet 2   apixaban  (ELIQUIS ) 5 MG TABS tablet Take 1 tablet (5 mg total) by mouth 2 (two) times daily. 180 tablet 3   B-D ULTRAFINE III SHORT PEN 31G X 8 MM MISC USE AS DIRECTED TWICE DAILY FOR MEDICATION INJECTION 100 each 1   blood glucose meter kit and supplies KIT Dispense based on patient and insurance preference. Use to check glucose 4 times daily. 1 each 0   cetirizine  (ZYRTEC ) 10 MG tablet TAKE 1 TABLET BY MOUTH DAILY AS NEEDED FOR ITCHING 90 tablet 1   Continuous Glucose Receiver (FREESTYLE LIBRE 3 READER) DEVI Use to check glucose continuously 2 each 3   Continuous Glucose Sensor (FREESTYLE LIBRE 3 SENSOR) MISC PLACE 1 SENSOR ON THE SKIN EVERY 14 DAYS. USE TO CHECK GLUCOSE CONTINUOUSLY 2 each 3   dapagliflozin  propanediol (FARXIGA ) 10 MG TABS tablet Take 1 tablet (10 mg total) by mouth daily before breakfast. 90 tablet 3   esomeprazole  (NEXIUM ) 40 MG capsule Take 1 capsule (40 mg total) by mouth daily. 90 capsule 1   esomeprazole  (NEXIUM ) 40 MG capsule Take 1 capsule (40 mg total) by mouth daily. 90 capsule 1   Evolocumab   (REPATHA  SURECLICK) 140 MG/ML SOAJ INJECT 1 DOSE INTO THE SKIN EVERY 14 (FOURTEEN) DAYS. 6 mL 1   FREESTYLE TEST STRIPS test strip USE TO CHECK GLUCOSE 4 TIMES DAILY. 200 strip 4   insulin  aspart (NOVOLOG  FLEXPEN) 100 UNIT/ML FlexPen USE 10 UNITS SUBCUTANEOUSLY WITH BREAKFAST AND LUNCH AND USE 5 UNITS AT SUPPER. 15 mL 3   Insulin  Degludec (TRESIBA  [place]) Inject 36 Units into the skin in the morning. 26 units in the evening (Patient taking differently: Inject 46 Units into the skin in the morning. 36 units in the evening)     methocarbamol  (ROBAXIN ) 500 MG tablet Take 500 mg by mouth 2 (two) times daily as needed for muscle spasms.     methocarbamol  (ROBAXIN ) 500 MG tablet Take 1 tablet (500 mg total) by mouth 2 (two) times daily as needed. 60 tablet 1   metolazone  (ZAROXOLYN ) 2.5 MG tablet Take 1 tablet (2.5 mg total) by mouth as directed. Only take after you have spoken to the Heart Failure Clinic 15 tablet 2   morphine  (MS CONTIN ) 30 MG 12 hr tablet Take 30-60 mg by mouth See admin instructions. Taking 60 mg in the AM and 30 mg in the afternoon and 60 mg at bedtime     morphine  (MS CONTIN ) 30 MG 12 hr tablet Take 2 tablets (60 mg total) by mouth every morning AND 1 tablet (30 mg total) daily midday AND 2 tablets (60 mg total) every evening. Space doses 8 hours apart. 150 tablet 0   morphine  (MS CONTIN ) 30 MG 12 hr tablet Take 1-2 tablets (30-60 mg total) by mouth every 8 (eight) hours; 2 tablets in the morning, 1 tablet at midday and 2 tablets in the afternoon 11/22/23 150 tablet 0   morphine  (MS CONTIN ) 30 MG 12 hr tablet Take 1-2 tablets (30-60 mg total)  by mouth every 8 (eight) hours; 2 tablets in the morning, 1 tablet at midday and 2 tablets in the evening 150 tablet 0   morphine  (MS CONTIN ) 30 MG 12 hr tablet Take 2 tablets (60 mg total) by mouth in the morning AND 1 tablet (30 mg total) daily in the afternoon AND 2 tablets (60 mg total) every evening. 150 tablet 0   morphine  (MS CONTIN ) 30 MG 12 hr  tablet Take 2 tablets (60 mg total) by mouth in the morning AND 1 tablet (30 mg total) daily in the afternoon AND 2 tablets (60 mg total) every evening. 150 tablet 0   Multiple Vitamin (MULTIVITAMIN) tablet Take 1 tablet by mouth daily.     mupirocin  ointment (BACTROBAN ) 2 % Apply 1 Application topically 3 (three) times daily. 15 g 0   nitroGLYCERIN  (NITROSTAT ) 0.4 MG SL tablet Place 1 tablet (0.4 mg total) under the tongue every 5 (five) minutes as needed for chest pain. 25 tablet 4   olmesartan (BENICAR) 20 MG tablet Take 10 mg by mouth daily.     torsemide  (DEMADEX ) 20 MG tablet TAKE 2 TABLETS BY MOUTH 2 TIMES DAILY. 360 tablet 1   No facility-administered medications prior to visit.    Allergies  Allergen Reactions   Ace Inhibitors     Kidney failure   Atorvastatin  Other (See Comments)    Arm pain   Prednisone     REACTION: Anxiety   Septra  [Sulfamethoxazole -Trimethoprim ]    Metformin  And Related Diarrhea    Review of Systems *** PE;    12/13/2023    2:34 PM 11/04/2023    3:22 PM 10/13/2023    7:32 PM  Vitals with BMI  Height 6' 1 6' 0 6' 0  Weight 319 lbs 13 oz 308 lbs 300 lbs  BMI 42.2 41.76 40.68  Systolic 144 113   Diastolic 72 63   Pulse 102 90      *** Pertinent labs:  Lab Results  Component Value Date   TSH 2.63 08/04/2023   Lab Results  Component Value Date   WBC 10.1 06/23/2023   HGB 12.7 (L) 06/23/2023   HCT 39.1 06/23/2023   MCV 88.7 06/23/2023   PLT 269.0 06/23/2023   Lab Results  Component Value Date   CREATININE 1.57 (H) 11/11/2023   BUN 20 11/11/2023   NA 141 11/11/2023   K 5.0 11/11/2023   CL 103 11/11/2023   CO2 28 11/11/2023   Lab Results  Component Value Date   ALT 14 11/04/2023   AST 16 11/04/2023   ALKPHOS 65 06/01/2023   BILITOT 0.7 11/04/2023   Lab Results  Component Value Date   CHOL 120 05/07/2023   Lab Results  Component Value Date   HDL 21 (L) 05/07/2023   Lab Results  Component Value Date   LDLCALC 84  05/07/2023   Lab Results  Component Value Date   TRIG 200 (H) 05/12/2023   Lab Results  Component Value Date   CHOLHDL 5.7 05/07/2023   Lab Results  Component Value Date   PSA 0.28 08/05/2022   PSA 0.15 09/12/2020   PSA 0.2 10/18/2019   Lab Results  Component Value Date   HGBA1C 8.0 (A) 11/04/2023   HGBA1C 8.0 11/04/2023   HGBA1C 8.0 (A) 11/04/2023   HGBA1C 8.0 (A) 11/04/2023   ASSESSMENT AND PLAN:   No problem-specific Assessment & Plan notes found for this encounter.  1 health maintenance exam: Reviewed age and gender appropriate health  maintenance issues (prudent diet, regular exercise, health risks of tobacco and excessive alcohol, use of seatbelts, fire alarms in home, use of sunscreen).  Also reviewed age and gender appropriate health screening as well as vaccine recommendations. Vaccines: Tdap->***. Labs: HP labs + Hba1c, urine microalb/cr, and PSA. Prostate ca screening: PSA today. Colon ca screening: 05/2021 polyp-->recall 77yrs.  Add GLP1?   An After Visit Summary was printed and given to the patient.  FOLLOW UP:  No follow-ups on file.  Signed:  Gerlene Hockey, MD           02/06/2024

## 2024-02-07 ENCOUNTER — Encounter: Payer: Self-pay | Admitting: Family Medicine

## 2024-02-08 ENCOUNTER — Encounter (INDEPENDENT_AMBULATORY_CARE_PROVIDER_SITE_OTHER): Payer: Self-pay

## 2024-02-08 ENCOUNTER — Ambulatory Visit (INDEPENDENT_AMBULATORY_CARE_PROVIDER_SITE_OTHER)

## 2024-02-08 ENCOUNTER — Other Ambulatory Visit (HOSPITAL_COMMUNITY): Payer: Self-pay

## 2024-02-08 VITALS — BP 151/81 | HR 94 | Temp 98.1°F | Ht 73.0 in | Wt 324.0 lb

## 2024-02-08 DIAGNOSIS — Z789 Other specified health status: Secondary | ICD-10-CM

## 2024-02-08 DIAGNOSIS — Z6841 Body Mass Index (BMI) 40.0 and over, adult: Secondary | ICD-10-CM | POA: Diagnosis not present

## 2024-02-08 DIAGNOSIS — G4733 Obstructive sleep apnea (adult) (pediatric): Secondary | ICD-10-CM

## 2024-02-08 NOTE — Progress Notes (Unsigned)
 Dear Dr. Hope, Here is my assessment for our mutual patient, Dylan Owen. Thank you for allowing me the opportunity to care for your patient. Please do not hesitate to contact me should you have any other questions. Sincerely, Dr. Hadassah Parody  Otolaryngology Clinic Note Referring provider: Dr. Hope HPI:   Initial HPI (02/08/2024) Dylan Owen is a 58 y.o. male kindly referred by Dr. Hope for evaluation of ***.   H&N Surgery: *** Personal or FHx of bleeding dz or anesthesia difficulty: no ***  GLP-1: *** AP/AC: ***  Tobacco: *** Alcohol: ***.  Independent Review of Additional Tests or Records:  Referral note Almarie Hope, NP (12/13/23):    PMH/Meds/All/SocHx/FamHx/ROS:   Past Medical History:  Diagnosis Date   B12 deficiency    Chronic low back pain    Dr. Orlando is pain mgmt MD   Depression    Diabetes mellitus with complication (HCC)    DPN + microalbuminuria   GERD (gastroesophageal reflux disease)    History of acute renal failure    Required hemodialysis in hospital for cardiogenic shock 05/2023   History of adenomatous polyp of colon 05/22/2021   recall 7 yrs   Hypercholesterolemia    Statin intolerant.  Started Repatha  10/2021   Hypertension    Nonischemic cardiomyopathy (HCC)    Acute heart failure due to sepsis/shock.  EF 40 to 45% 05/2023.   OSA (obstructive sleep apnea)    on nighttime home O2 (hx of refusal to wear CPAP because of claustrophobia)   PAF (paroxysmal atrial fibrillation) (HCC)    Initial diagnosis during hospitalization for shock 05/2023.  DCCV successful in hospital.  Discharged home on amiodarone  and Eliquis .   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 + chronic lumbar radiculopathy   Persistent asthma    Never smoker   Seizures (HCC)    Statin myopathy    Subclinical hypothyroidism 06/2017     Past Surgical History:  Procedure Laterality Date   COLONOSCOPY  05/22/2021   Adenomas--recall 7 years   CYSTOSCOPY WITH  RETROGRADE URETHROGRAM N/A 08/21/2021   Procedure: RETROGRADE URETHROGRAM;  Surgeon: Cam Morene ORN, MD;  Location: [facility];  Service: Urology;  Laterality: N/A;   CYSTOSCOPY WITH URETHRAL DILATATION N/A 08/21/2021   Procedure: CYSTOSCOPY WITH URETHRAL DILATATION OPTILUME;  Surgeon: Cam Morene ORN, MD;  Location: [facility];  Service: Urology;  Laterality: N/A;   IR FLUORO GUIDE CV LINE RIGHT  05/19/2023   IR REMOVAL TUN CV CATH W/O FL  06/03/2023   IR US  GUIDE VASC ACCESS RIGHT  05/19/2023   LUMBAR SPINE SURGERY     lumbar laminectomy with hardware stabilization, with ensuing arachnoiditis   RIGHT HEART CATH N/A 05/13/2023   normal R and L heart pressures.  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    Procedure: RIGHT HEART CATH;  Surgeon: Gardenia Led, DO;  Location: [facility];  Service: Cardiovascular;  Laterality: N/A;   TRANSTHORACIC ECHOCARDIOGRAM     05/12/23 EF 40-45%, regional WMA, valves good.  08/2023 EF 45-50%, RWMA, mild dec RV fxn.   URETHRAL STRICTURE DILATATION  1996 and 2004.   Laser surgery.    Family History  Problem Relation Age of Onset   Cancer Paternal Grandfather        prostate   Heart disease Other    Hypertension Other    Other Other        Emotional Illness  Colon cancer Neg Hx    Esophageal cancer Neg Hx    Rectal cancer Neg Hx    Stomach cancer Neg Hx      Social Connections: Moderately Isolated (05/05/2023)   Social Connection and Isolation Panel    Frequency of Communication with Friends and Family: Three times a week    Frequency of Social Gatherings with Friends and Family: Never    Attends Religious Services: Never    Database Administrator or Organizations: No    Attends Engineer, Structural: Never    Marital Status: Married     Current Outpatient Medications  Medication Instructions   Acetaminophen  (TYLENOL  PO) 500 mg, Daily PRN    albuterol  (VENTOLIN  HFA) 108 (90 Base) MCG/ACT inhaler INHALE 1-2 PUFFS BY MOUTH EVERY 6 HOURS AS NEEDED FOR WHEEZE OR SHORTNESS OF BREATH   amiodarone  (PACERONE ) 200 mg, Oral, Daily   apixaban  (ELIQUIS ) 5 mg, Oral, 2 times daily   B-D ULTRAFINE III SHORT PEN 31G X 8 MM MISC USE AS DIRECTED TWICE DAILY FOR MEDICATION INJECTION   blood glucose meter kit and supplies KIT Dispense based on patient and insurance preference. Use to check glucose 4 times daily.   cetirizine  (ZYRTEC ) 10 MG tablet TAKE 1 TABLET BY MOUTH DAILY AS NEEDED FOR ITCHING   Continuous Glucose Receiver (FREESTYLE LIBRE 3 READER) DEVI Use to check glucose continuously   Continuous Glucose Sensor (FREESTYLE LIBRE 3 SENSOR) MISC PLACE 1 SENSOR ON THE SKIN EVERY 14 DAYS. USE TO CHECK GLUCOSE CONTINUOUSLY   dapagliflozin  propanediol (FARXIGA ) 10 mg, Oral, Daily before breakfast   esomeprazole  (NEXIUM ) 40 mg, Oral, Daily   esomeprazole  (NEXIUM ) 40 mg, Oral, Daily   Evolocumab  (REPATHA  SURECLICK) 140 MG/ML SOAJ Subcutaneous, Every 14 days   FREESTYLE TEST STRIPS test strip USE TO CHECK GLUCOSE 4 TIMES DAILY.   insulin  aspart (NOVOLOG  FLEXPEN) 100 UNIT/ML FlexPen USE 10 UNITS SUBCUTANEOUSLY WITH BREAKFAST AND LUNCH AND USE 5 UNITS AT SUPPER.   Insulin  Degludec (TRESIBA  [place]) 36 Units, Every morning   methocarbamol  (ROBAXIN ) 500 mg, 2 times daily PRN   methocarbamol  (ROBAXIN ) 500 mg, Oral, 2 times daily PRN   metolazone  (ZAROXOLYN ) 2.5 mg, Oral, As directed, Only take after you have spoken to the [facility]   morphine  (MS CONTIN ) 30 MG 12 hr tablet Take 2 tablets (60 mg total) by mouth every morning AND 1 tablet (30 mg total) daily midday AND 2 tablets (60 mg total) every evening. Space doses 8 hours apart.   morphine  (MS CONTIN ) 30 MG 12 hr tablet Take 1-2 tablets (30-60 mg total) by mouth every 8 (eight) hours; 2 tablets in the morning, 1 tablet at midday and 2 tablets in the afternoon 11/22/23   morphine  (MS CONTIN ) 30 MG 12 hr  tablet Take 1-2 tablets (30-60 mg total) by mouth every 8 (eight) hours; 2 tablets in the morning, 1 tablet at midday and 2 tablets in the evening   morphine  (MS CONTIN ) 30 MG 12 hr tablet Take 2 tablets (60 mg total) by mouth in the morning AND 1 tablet (30 mg total) daily in the afternoon AND 2 tablets (60 mg total) every evening.   morphine  (MS CONTIN ) 30 MG 12 hr tablet Take 2 tablets (60 mg total) by mouth in the morning AND 1 tablet (30 mg total) daily in the afternoon AND 2 tablets (60 mg total) every evening.   morphine  (MS CONTIN ) 30-60 mg, See admin instructions   Multiple Vitamin (MULTIVITAMIN)  tablet 1 tablet, Daily   mupirocin  ointment (BACTROBAN ) 2 % 1 Application, Topical, 3 times daily   nitroGLYCERIN  (NITROSTAT ) 0.4 mg, Sublingual, Every 5 min PRN   olmesartan (BENICAR) 10 mg, Daily   torsemide  (DEMADEX ) 40 mg, Oral, 2 times daily     Physical Exam:   BP (!) 151/81 Comment: 179/89 was the first attempt for BP  Pulse 94   Temp 98.1 F (36.7 C)   Ht 6' 1 (1.854 m)   Wt (!) 324 lb (147 kg)   SpO2 93%   BMI 42.75 kg/m   Salient findings:  CN II-XII intact *** Bilateral EAC clear and TM intact with well pneumatized middle ear spaces Weber 512: *** Rinne 512: AC > BC b/l *** Rine 1024: AC > BC b/l *** Anterior rhinoscopy: Septum ***; bilateral inferior turbinates with *** No lesions of oral cavity/oropharynx; dentition *** No obviously palpable neck masses/lymphadenopathy/thyromegaly No respiratory distress or stridor***  Seprately Identifiable Procedures:  Prior to initiating any procedures, risks/benefits/alternatives were explained to the patient and verbal consent obtained. None***  Impression & Plans:  Nicolae Vasek is a 58 y.o. male with ***  No diagnosis found.   See below regarding exact medications prescribed this encounter including dosages and route: No orders of the defined types were placed in this encounter.     Thank you for allowing me  the opportunity to care for your patient. Please do not hesitate to contact me should you have any other questions.  Sincerely, Hadassah Parody, MD Otolaryngologist (ENT), [facility] Phone: [phone] Fax: [phone]  MDM:  Level *** Complexity/Problems addressed: *** Data complexity: *** independent review of *** - Morbidity: ***  - Prescription Drug prescribed or managed: ***

## 2024-02-09 ENCOUNTER — Other Ambulatory Visit (HOSPITAL_COMMUNITY): Payer: Self-pay

## 2024-02-14 ENCOUNTER — Other Ambulatory Visit (HOSPITAL_COMMUNITY): Payer: Self-pay

## 2024-02-14 DIAGNOSIS — E1142 Type 2 diabetes mellitus with diabetic polyneuropathy: Secondary | ICD-10-CM | POA: Diagnosis not present

## 2024-02-14 DIAGNOSIS — M961 Postlaminectomy syndrome, not elsewhere classified: Secondary | ICD-10-CM | POA: Diagnosis not present

## 2024-02-14 DIAGNOSIS — Z79891 Long term (current) use of opiate analgesic: Secondary | ICD-10-CM | POA: Diagnosis not present

## 2024-02-14 DIAGNOSIS — G894 Chronic pain syndrome: Secondary | ICD-10-CM | POA: Diagnosis not present

## 2024-02-14 MED ORDER — MORPHINE SULFATE ER 30 MG PO TBCR
EXTENDED_RELEASE_TABLET | ORAL | 0 refills | Status: AC
  Filled 2024-03-09: qty 150, 30d supply, fill #0

## 2024-02-14 MED ORDER — MORPHINE SULFATE ER 30 MG PO TBCR
EXTENDED_RELEASE_TABLET | ORAL | 0 refills | Status: AC
  Filled 2024-04-11: qty 150, 30d supply, fill #0

## 2024-02-14 MED ORDER — METHOCARBAMOL 500 MG PO TABS
500.0000 mg | ORAL_TABLET | Freq: Two times a day (BID) | ORAL | 1 refills | Status: AC | PRN
  Filled 2024-02-14 – 2024-03-09 (×2): qty 60, 30d supply, fill #0
  Filled 2024-04-11: qty 60, 30d supply, fill #1

## 2024-02-19 ENCOUNTER — Other Ambulatory Visit: Payer: Self-pay | Admitting: Internal Medicine

## 2024-02-19 DIAGNOSIS — E785 Hyperlipidemia, unspecified: Secondary | ICD-10-CM

## 2024-03-06 ENCOUNTER — Other Ambulatory Visit: Payer: Self-pay | Admitting: Family Medicine

## 2024-03-09 ENCOUNTER — Other Ambulatory Visit (HOSPITAL_COMMUNITY): Payer: Self-pay

## 2024-03-19 ENCOUNTER — Other Ambulatory Visit: Payer: Self-pay | Admitting: Internal Medicine

## 2024-03-19 ENCOUNTER — Ambulatory Visit: Admitting: Primary Care

## 2024-03-19 DIAGNOSIS — G4733 Obstructive sleep apnea (adult) (pediatric): Secondary | ICD-10-CM

## 2024-03-19 DIAGNOSIS — E785 Hyperlipidemia, unspecified: Secondary | ICD-10-CM

## 2024-03-24 ENCOUNTER — Other Ambulatory Visit: Payer: Self-pay | Admitting: Family Medicine

## 2024-04-10 ENCOUNTER — Other Ambulatory Visit: Payer: Self-pay | Admitting: Family Medicine

## 2024-04-11 ENCOUNTER — Other Ambulatory Visit (HOSPITAL_COMMUNITY): Payer: Self-pay

## 2024-04-22 ENCOUNTER — Other Ambulatory Visit: Payer: Self-pay | Admitting: Family Medicine

## 2024-04-23 NOTE — Telephone Encounter (Signed)
 LVM for pt to return call. He is overdue for follow up, last OV August.

## 2024-04-24 ENCOUNTER — Other Ambulatory Visit: Payer: Self-pay | Admitting: Family Medicine

## 2024-04-24 DIAGNOSIS — E118 Type 2 diabetes mellitus with unspecified complications: Secondary | ICD-10-CM

## 2024-04-26 ENCOUNTER — Other Ambulatory Visit: Payer: Self-pay | Admitting: Family Medicine

## 2024-05-02 ENCOUNTER — Ambulatory Visit
